# Patient Record
Sex: Male | Born: 1940 | Race: White | Hispanic: No | Marital: Married | Smoking: Never smoker
Health system: Southern US, Community
[De-identification: ages and names within clinical notes are randomized; demographics above are authoritative.]

## PROBLEM LIST (undated history)

## (undated) DIAGNOSIS — I6522 Occlusion and stenosis of left carotid artery: Secondary | ICD-10-CM

## (undated) DIAGNOSIS — C61 Malignant neoplasm of prostate: Secondary | ICD-10-CM

## (undated) DIAGNOSIS — I10 Essential (primary) hypertension: Secondary | ICD-10-CM

## (undated) DIAGNOSIS — I639 Cerebral infarction, unspecified: Secondary | ICD-10-CM

## (undated) DIAGNOSIS — I38 Endocarditis, valve unspecified: Secondary | ICD-10-CM

## (undated) DIAGNOSIS — H269 Unspecified cataract: Secondary | ICD-10-CM

## (undated) DIAGNOSIS — E119 Type 2 diabetes mellitus without complications: Secondary | ICD-10-CM

## (undated) DIAGNOSIS — G4730 Sleep apnea, unspecified: Secondary | ICD-10-CM

## (undated) DIAGNOSIS — E785 Hyperlipidemia, unspecified: Secondary | ICD-10-CM

## (undated) DIAGNOSIS — I2510 Atherosclerotic heart disease of native coronary artery without angina pectoris: Secondary | ICD-10-CM

## (undated) HISTORY — DX: Endocarditis, valve unspecified: I38

## (undated) HISTORY — PX: COLONOSCOPY: SHX174

## (undated) HISTORY — DX: Hyperlipidemia, unspecified: E78.5

## (undated) HISTORY — PX: CARDIAC CATHETERIZATION: SHX172

## (undated) HISTORY — PX: PROSTATECTOMY: SHX69

## (undated) HISTORY — DX: Essential (primary) hypertension: I10

## (undated) HISTORY — DX: Cerebral infarction, unspecified: I63.9

## (undated) HISTORY — DX: Type 2 diabetes mellitus without complications: E11.9

## (undated) HISTORY — PX: TONSILLECTOMY: SUR1361

## (undated) HISTORY — PX: CATARACT EXTRACTION, BILATERAL: SHX1313

## (undated) HISTORY — PX: INGUINAL HERNIA REPAIR: SUR1180

## (undated) HISTORY — PX: APPENDECTOMY: SHX54

## (undated) HISTORY — DX: Malignant neoplasm of prostate: C61

## (undated) HISTORY — DX: Sleep apnea, unspecified: G47.30

## (undated) HISTORY — DX: Unspecified cataract: H26.9

---

## 1999-09-29 ENCOUNTER — Ambulatory Visit (HOSPITAL_COMMUNITY): Admission: RE | Admit: 1999-09-29 | Discharge: 1999-09-29 | Payer: Self-pay | Admitting: Internal Medicine

## 1999-09-29 ENCOUNTER — Encounter: Payer: Self-pay | Admitting: Internal Medicine

## 1999-10-13 ENCOUNTER — Other Ambulatory Visit: Admission: RE | Admit: 1999-10-13 | Discharge: 1999-10-13 | Payer: Self-pay | Admitting: Urology

## 2000-01-01 ENCOUNTER — Encounter: Admission: RE | Admit: 2000-01-01 | Discharge: 2000-03-31 | Payer: Self-pay | Admitting: Radiation Oncology

## 2000-02-27 DIAGNOSIS — Z9079 Acquired absence of other genital organ(s): Secondary | ICD-10-CM | POA: Insufficient documentation

## 2002-06-24 DIAGNOSIS — E7800 Pure hypercholesterolemia, unspecified: Secondary | ICD-10-CM | POA: Insufficient documentation

## 2002-06-24 DIAGNOSIS — I10 Essential (primary) hypertension: Secondary | ICD-10-CM | POA: Insufficient documentation

## 2004-06-19 ENCOUNTER — Ambulatory Visit: Payer: Self-pay | Admitting: Cardiology

## 2005-06-06 ENCOUNTER — Ambulatory Visit: Payer: Self-pay | Admitting: Internal Medicine

## 2005-09-20 ENCOUNTER — Ambulatory Visit: Payer: Self-pay | Admitting: Internal Medicine

## 2005-12-12 ENCOUNTER — Ambulatory Visit: Payer: Self-pay | Admitting: Internal Medicine

## 2005-12-18 ENCOUNTER — Ambulatory Visit: Payer: Self-pay | Admitting: Internal Medicine

## 2006-02-27 ENCOUNTER — Ambulatory Visit: Payer: Self-pay | Admitting: Internal Medicine

## 2006-05-24 ENCOUNTER — Ambulatory Visit: Payer: Self-pay

## 2006-05-30 ENCOUNTER — Ambulatory Visit: Payer: Self-pay | Admitting: Internal Medicine

## 2006-08-26 ENCOUNTER — Ambulatory Visit: Payer: Self-pay | Admitting: Internal Medicine

## 2006-09-06 ENCOUNTER — Ambulatory Visit: Payer: Self-pay | Admitting: Internal Medicine

## 2006-09-06 LAB — CONVERTED CEMR LAB
BUN: 17 mg/dL (ref 6–23)
CO2: 30 meq/L (ref 19–32)
Calcium: 9.3 mg/dL (ref 8.4–10.5)
Chloride: 104 meq/L (ref 96–112)
Creatinine, Ser: 1.2 mg/dL (ref 0.4–1.5)
GFR calc Af Amer: 78 mL/min
GFR calc non Af Amer: 65 mL/min
Glucose, Bld: 118 mg/dL — ABNORMAL HIGH (ref 70–99)
Potassium: 4.1 meq/L (ref 3.5–5.1)
Sodium: 142 meq/L (ref 135–145)

## 2006-09-09 ENCOUNTER — Ambulatory Visit: Payer: Self-pay | Admitting: Internal Medicine

## 2007-01-06 ENCOUNTER — Ambulatory Visit: Payer: Self-pay | Admitting: Internal Medicine

## 2007-01-06 LAB — CONVERTED CEMR LAB
ALT: 24 units/L (ref 0–40)
AST: 22 units/L (ref 0–37)
Albumin: 3.9 g/dL (ref 3.5–5.2)
Alkaline Phosphatase: 68 units/L (ref 39–117)
BUN: 23 mg/dL (ref 6–23)
Bilirubin, Direct: 0.1 mg/dL (ref 0.0–0.3)
CO2: 32 meq/L (ref 19–32)
Calcium: 9.3 mg/dL (ref 8.4–10.5)
Chloride: 108 meq/L (ref 96–112)
Cholesterol: 162 mg/dL (ref 0–200)
Creatinine, Ser: 1.1 mg/dL (ref 0.4–1.5)
GFR calc Af Amer: 86 mL/min
GFR calc non Af Amer: 71 mL/min
Glucose, Bld: 117 mg/dL — ABNORMAL HIGH (ref 70–99)
HDL: 40.9 mg/dL (ref >39.0)
Hgb A1c MFr Bld: 6.6 % — ABNORMAL HIGH (ref 4.6–6.0)
LDL Cholesterol: 96 mg/dL (ref 0–99)
Potassium: 4.3 meq/L (ref 3.5–5.1)
Sodium: 144 meq/L (ref 135–145)
Total Bilirubin: 0.5 mg/dL (ref 0.3–1.2)
Total CHOL/HDL Ratio: 4
Total Protein: 6.8 g/dL (ref 6.0–8.3)
Triglycerides: 127 mg/dL (ref 0–149)
VLDL: 25 mg/dL (ref 0–40)
Vit D, 1,25-Dihydroxy: 26 (ref 20–57)

## 2007-04-15 ENCOUNTER — Ambulatory Visit: Payer: Self-pay | Admitting: Internal Medicine

## 2007-04-15 LAB — CONVERTED CEMR LAB
AST: 23 units/L (ref 0–37)
Albumin: 3.7 g/dL (ref 3.5–5.2)
CO2: 27 meq/L (ref 19–32)
Chloride: 105 meq/L (ref 96–112)
Cholesterol: 147 mg/dL (ref 0–200)
Creatinine, Ser: 1.1 mg/dL (ref 0.4–1.5)
Glucose, Bld: 114 mg/dL — ABNORMAL HIGH (ref 70–99)
HDL: 37.2 mg/dL — ABNORMAL LOW (ref >39.0)
Hgb A1c MFr Bld: 6.2 % — ABNORMAL HIGH (ref 4.6–6.0)
Sodium: 140 meq/L (ref 135–145)
Total Bilirubin: 0.6 mg/dL (ref 0.3–1.2)
VLDL: 33 mg/dL (ref 0–40)

## 2007-06-24 ENCOUNTER — Encounter: Payer: Self-pay | Admitting: Internal Medicine

## 2007-06-24 DIAGNOSIS — E785 Hyperlipidemia, unspecified: Secondary | ICD-10-CM

## 2007-06-24 DIAGNOSIS — I10 Essential (primary) hypertension: Secondary | ICD-10-CM | POA: Insufficient documentation

## 2007-06-24 DIAGNOSIS — E119 Type 2 diabetes mellitus without complications: Secondary | ICD-10-CM | POA: Insufficient documentation

## 2007-06-24 DIAGNOSIS — Z8546 Personal history of malignant neoplasm of prostate: Secondary | ICD-10-CM | POA: Insufficient documentation

## 2007-06-24 DIAGNOSIS — E1159 Type 2 diabetes mellitus with other circulatory complications: Secondary | ICD-10-CM | POA: Insufficient documentation

## 2007-11-26 ENCOUNTER — Encounter: Payer: Self-pay | Admitting: Internal Medicine

## 2008-05-24 ENCOUNTER — Encounter: Payer: Self-pay | Admitting: Internal Medicine

## 2008-05-27 ENCOUNTER — Ambulatory Visit: Payer: Self-pay | Admitting: Internal Medicine

## 2008-05-31 ENCOUNTER — Ambulatory Visit: Payer: Self-pay

## 2008-08-17 ENCOUNTER — Telehealth: Payer: Self-pay | Admitting: Internal Medicine

## 2008-10-29 ENCOUNTER — Ambulatory Visit: Payer: Self-pay | Admitting: Internal Medicine

## 2008-10-29 LAB — CONVERTED CEMR LAB
ALT: 42 units/L (ref 0–53)
AST: 32 units/L (ref 0–37)
Bilirubin, Direct: 0.1 mg/dL (ref 0.0–0.3)
CO2: 33 meq/L — ABNORMAL HIGH (ref 19–32)
Calcium: 9.4 mg/dL (ref 8.4–10.5)
Chloride: 102 meq/L (ref 96–112)
Glucose, Bld: 146 mg/dL — ABNORMAL HIGH (ref 70–99)
Sodium: 142 meq/L (ref 135–145)
Total Protein: 6.8 g/dL (ref 6.0–8.3)

## 2008-11-04 ENCOUNTER — Ambulatory Visit: Payer: Self-pay | Admitting: Internal Medicine

## 2009-03-10 ENCOUNTER — Ambulatory Visit: Payer: Self-pay | Admitting: Internal Medicine

## 2009-07-11 ENCOUNTER — Ambulatory Visit: Payer: Self-pay | Admitting: Internal Medicine

## 2009-07-11 LAB — CONVERTED CEMR LAB
BUN: 20 mg/dL
CO2: 30 meq/L
Calcium: 9.2 mg/dL
Chloride: 101 meq/L
Cholesterol: 140 mg/dL
Creatinine, Ser: 1.2 mg/dL
GFR calc non Af Amer: 64.06 mL/min
Glucose, Bld: 171 mg/dL — ABNORMAL HIGH
HDL: 41.6 mg/dL
Hgb A1c MFr Bld: 6.3 %
LDL Cholesterol: 74 mg/dL
Potassium: 4.1 meq/L
Sodium: 139 meq/L
Total CHOL/HDL Ratio: 3
Triglycerides: 123 mg/dL
VLDL: 24.6 mg/dL

## 2009-07-19 ENCOUNTER — Ambulatory Visit: Payer: Self-pay | Admitting: Internal Medicine

## 2009-07-19 DIAGNOSIS — M545 Low back pain, unspecified: Secondary | ICD-10-CM | POA: Insufficient documentation

## 2009-09-29 ENCOUNTER — Encounter: Payer: Self-pay | Admitting: Internal Medicine

## 2009-10-18 ENCOUNTER — Encounter: Payer: Self-pay | Admitting: Internal Medicine

## 2009-11-23 ENCOUNTER — Ambulatory Visit: Payer: Self-pay | Admitting: Internal Medicine

## 2009-11-23 LAB — CONVERTED CEMR LAB
CO2: 30 meq/L (ref 19–32)
Chloride: 102 meq/L (ref 96–112)
Hgb A1c MFr Bld: 6.5 % (ref 4.6–6.5)
Potassium: 3.8 meq/L (ref 3.5–5.1)
Sodium: 139 meq/L (ref 135–145)

## 2009-11-25 ENCOUNTER — Ambulatory Visit: Payer: Self-pay | Admitting: Internal Medicine

## 2010-03-30 ENCOUNTER — Ambulatory Visit: Payer: Self-pay | Admitting: Internal Medicine

## 2010-03-30 LAB — CONVERTED CEMR LAB: Blood Glucose, Fingerstick: 136

## 2010-03-31 LAB — CONVERTED CEMR LAB
CO2: 29 meq/L (ref 19–32)
Chloride: 103 meq/L (ref 96–112)
Creatinine, Ser: 1.1 mg/dL (ref 0.4–1.5)
Hgb A1c MFr Bld: 6.6 % — ABNORMAL HIGH (ref 4.6–6.5)
Sodium: 141 meq/L (ref 135–145)

## 2010-04-25 ENCOUNTER — Encounter: Payer: Self-pay | Admitting: Internal Medicine

## 2010-08-24 ENCOUNTER — Ambulatory Visit
Admission: RE | Admit: 2010-08-24 | Discharge: 2010-08-24 | Payer: Self-pay | Source: Home / Self Care | Attending: Internal Medicine | Admitting: Internal Medicine

## 2010-08-24 ENCOUNTER — Other Ambulatory Visit: Payer: Self-pay | Admitting: Internal Medicine

## 2010-08-24 DIAGNOSIS — R0989 Other specified symptoms and signs involving the circulatory and respiratory systems: Secondary | ICD-10-CM

## 2010-08-24 DIAGNOSIS — R0609 Other forms of dyspnea: Secondary | ICD-10-CM | POA: Insufficient documentation

## 2010-08-24 LAB — LIPID PANEL
Cholesterol: 157 mg/dL (ref 0–200)
HDL: 35.8 mg/dL — ABNORMAL LOW (ref >39.00)
LDL Cholesterol: 90 mg/dL (ref 0–99)
Total CHOL/HDL Ratio: 4
Triglycerides: 155 mg/dL — ABNORMAL HIGH (ref 0.0–149.0)
VLDL: 31 mg/dL (ref 0.0–40.0)

## 2010-08-24 LAB — CBC WITH DIFFERENTIAL/PLATELET
Basophils Absolute: 0.1 10*3/uL (ref 0.0–0.1)
Basophils Relative: 0.9 % (ref 0.0–3.0)
Eosinophils Absolute: 0.2 10*3/uL (ref 0.0–0.7)
Eosinophils Relative: 1.8 % (ref 0.0–5.0)
HCT: 42.9 % (ref 39.0–52.0)
Hemoglobin: 14.6 g/dL (ref 13.0–17.0)
Lymphocytes Relative: 27.5 % (ref 12.0–46.0)
Lymphs Abs: 2.9 10*3/uL (ref 0.7–4.0)
MCHC: 34 g/dL (ref 30.0–36.0)
MCV: 90 fl (ref 78.0–100.0)
Monocytes Absolute: 0.6 10*3/uL (ref 0.1–1.0)
Monocytes Relative: 6 % (ref 3.0–12.0)
Neutro Abs: 6.8 10*3/uL (ref 1.4–7.7)
Neutrophils Relative %: 63.8 % (ref 43.0–77.0)
Platelets: 306 10*3/uL (ref 150.0–400.0)
RBC: 4.76 Mil/uL (ref 4.22–5.81)
RDW: 12.9 % (ref 11.5–14.6)
WBC: 10.6 10*3/uL — ABNORMAL HIGH (ref 4.5–10.5)

## 2010-08-24 LAB — URINALYSIS
Bilirubin Urine: NEGATIVE
Hemoglobin, Urine: NEGATIVE
Leukocytes, UA: NEGATIVE
Nitrite: NEGATIVE
Specific Gravity, Urine: >=1.03 (ref 1.000–1.030)
Total Protein, Urine: NEGATIVE
Urine Glucose: NEGATIVE
Urobilinogen, UA: 0.2 (ref 0.0–1.0)
pH: 5.5 (ref 5.0–8.0)

## 2010-08-24 LAB — BASIC METABOLIC PANEL
BUN: 21 mg/dL (ref 6–23)
CO2: 31 mEq/L (ref 19–32)
Calcium: 9.4 mg/dL (ref 8.4–10.5)
Chloride: 99 mEq/L (ref 96–112)
Creatinine, Ser: 1.1 mg/dL (ref 0.4–1.5)
GFR: 67.74 mL/min (ref >60.00)
Glucose, Bld: 148 mg/dL — ABNORMAL HIGH (ref 70–99)
Potassium: 4.1 mEq/L (ref 3.5–5.1)
Sodium: 140 mEq/L (ref 135–145)

## 2010-08-24 LAB — HEPATIC FUNCTION PANEL
ALT: 37 U/L (ref 0–53)
AST: 34 U/L (ref 0–37)
Albumin: 3.8 g/dL (ref 3.5–5.2)
Alkaline Phosphatase: 81 U/L (ref 39–117)
Bilirubin, Direct: 0.1 mg/dL (ref 0.0–0.3)
Total Bilirubin: 0.9 mg/dL (ref 0.3–1.2)
Total Protein: 6.8 g/dL (ref 6.0–8.3)

## 2010-08-24 LAB — TSH: TSH: 1.62 u[IU]/mL (ref 0.35–5.50)

## 2010-08-24 LAB — PSA: PSA: 0 ng/mL — ABNORMAL LOW (ref 0.10–4.00)

## 2010-08-24 LAB — HEMOGLOBIN A1C: Hgb A1c MFr Bld: 6.8 % — ABNORMAL HIGH (ref 4.6–6.5)

## 2010-08-28 ENCOUNTER — Encounter: Payer: Self-pay | Admitting: Internal Medicine

## 2010-09-07 ENCOUNTER — Ambulatory Visit: Admit: 2010-09-07 | Payer: Self-pay | Admitting: Pulmonary Disease

## 2010-09-13 ENCOUNTER — Encounter: Payer: Self-pay | Admitting: Internal Medicine

## 2010-09-17 LAB — CONVERTED CEMR LAB
AST: 26 units/L (ref 0–37)
Albumin: 3.8 g/dL (ref 3.5–5.2)
Alkaline Phosphatase: 62 units/L (ref 39–117)
BUN: 16 mg/dL (ref 6–23)
Bacteria, UA: NEGATIVE
Basophils Relative: 0.7 % (ref 0.0–3.0)
Bilirubin, Direct: 0.1 mg/dL (ref 0.0–0.3)
Chloride: 102 meq/L (ref 96–112)
Eosinophils Relative: 2.2 % (ref 0.0–5.0)
GFR calc non Af Amer: 71 mL/min
Glucose, Bld: 129 mg/dL — ABNORMAL HIGH (ref 70–99)
LDL Cholesterol: 77 mg/dL (ref 0–99)
Lymphocytes Relative: 31.7 % (ref 12.0–46.0)
MCV: 90.7 fL (ref 78.0–100.0)
Neutrophils Relative %: 58.7 % (ref 43.0–77.0)
Potassium: 4 meq/L (ref 3.5–5.1)
RBC: 4.5 M/uL (ref 4.22–5.81)
Specific Gravity, Urine: >=1.03 (ref 1.000–1.03)
Total Protein, Urine: NEGATIVE mg/dL
Total Protein: 6.8 g/dL (ref 6.0–8.3)
Urine Glucose: NEGATIVE mg/dL
VLDL: 32 mg/dL (ref 0–40)
WBC: 10.5 10*3/uL (ref 4.5–10.5)
pH: 5 (ref 5.0–8.0)

## 2010-09-19 NOTE — Assessment & Plan Note (Signed)
Summary: 4 mos f/u / #/cd  RS'D PER PT/NWS   Vital Signs:  Patient profile:   70 year old male Height:      72 inches Weight:      260.25 pounds BMI:     35.42 O2 Sat:      97 % on Room air Temp:     97.5 degrees F oral Pulse rate:   83 / minute BP sitting:   140 / 62  (left arm) Cuff size:   large  Vitals Entered By: Lucious Groves (November 25, 2009 10:23 AM)  O2 Flow:  Room air CC: 4 mo f/u--No symptoms or complaints per pt./kb Is Patient Diabetic? Yes Did you bring your meter with you today? No Pain Assessment Patient in pain? no        CC:  4 mo f/u--No symptoms or complaints per pt./kb.  History of Present Illness: The patient presents for a follow up of hypertension, diabetes, hyperlipidemia BP OK at home  Current Medications (verified): 1)  Zocor 40 Mg  Tabs (Simvastatin) .... Once Daily 2)  Adult Aspirin Low Strength 81 Mg  Tbdp (Aspirin) .... Once Daily 3)  Lotensin Hct 20-25 Mg  Tabs (Benazepril-Hydrochlorothiazide) .... Once Daily 4)  Glucophage 1000 Mg  Tabs (Metformin Hcl) .... Two Times A Day 5)  Verapamil Hcl Cr 240 Mg  Cp24 (Verapamil Hcl) .... Once Daily 6)  Amaryl 1 Mg  Tabs (Glimepiride) .... Two Times A Day 7)  Vitamin D3 1000 Unit  Tabs (Cholecalciferol) .[name].. 1 By Mouth Daily 8)  Accu-Chek Aviva  Strp (Glucose Blood) .... Test Blood Glucose Twice A Day For Diabetes Management  Allergies (verified): 1)  ! Clindamycin Hcl (Clindamycin Hcl) 2)  ! Prednisone (Prednisone)  Past History:  Social History: Last updated: 03/10/2009 Married Never Smoked Alcohol use-no Drug use-no Regular exercise-no, golf  Past Medical History: Reviewed history from 06/24/2007 and no changes required. Prostate cancer, hx of Diabetes mellitus, type II Hyperlipidemia Hypertension migraine weight gain  Review of Systems  The patient denies chest pain, syncope, abdominal pain, and severe indigestion/heartburn.    Physical Exam  General:  overweight-appearing.    Nose:  External nasal examination shows no deformity or inflammation. Nasal mucosa are pink and moist without lesions or exudates. Mouth:  Oral mucosa and oropharynx without lesions or exudates.  Teeth in good repair. Lungs:  Normal respiratory effort, chest expands symmetrically. Lungs are clear to auscultation, no crackles or wheezes. Heart:  Normal rate and regular rhythm. S1 and S2 normal without gallop, murmur, click, rub or other extra sounds. Abdomen:  Bowel sounds positive,abdomen soft and non-tender without masses, organomegaly or hernias noted. Msk:  No deformity or scoliosis noted of thoracic or lumbar spine.   Extremities:  No clubbing, cyanosis, edema, or deformity noted with normal full range of motion of all joints.   Neurologic:  No cranial nerve deficits noted. Station and gait are normal. Plantar reflexes are down-going bilaterally. DTRs are symmetrical throughout. Sensory, motor and coordinative functions appear intact. Skin:  Intact without suspicious lesions or rashes Psych:  Cognition and judgment appear intact. Alert and cooperative with normal attention span and concentration. No apparent delusions, illusions, hallucinations   Impression & Recommendations:  Problem # 1:  HYPERTENSION (ICD-401.9) Assessment Improved  His updated medication list for this problem includes:    Lotensin Hct 20-25 Mg Tabs (Benazepril-hydrochlorothiazide) ..... Once daily    Verapamil Hcl Cr 240 Mg Cp24 (Verapamil hcl) ..... Once daily  BP  today: 140/62 Prior BP: 154/72 (07/19/2009)  Labs Reviewed: K+: 3.8 (11/23/2009) Creat: : 1.1 (11/23/2009)   Chol: 140 (07/11/2009)   HDL: 41.60 (07/11/2009)   LDL: 74 (07/11/2009)   TG: 123.0 (07/11/2009)  Problem # 2:  DIABETES MELLITUS, TYPE II (ICD-250.00) Assessment: Improved  His updated medication list for this problem includes:    Adult Aspirin Low Strength 81 Mg Tbdp (Aspirin) ..... Once daily    Lotensin Hct 20-25 Mg Tabs  (Benazepril-hydrochlorothiazide) ..... Once daily    Glucophage 1000 Mg Tabs (Metformin hcl) .[name]..[name] Two times a day    Amaryl 1 Mg Tabs (Glimepiride) .[name]..[name] Two times a day  Labs Reviewed: Creat: 1.1 (11/23/2009)    Reviewed HgBA1c results: 6.5 (11/23/2009)  6.3 (07/11/2009)  Problem # 3:  LOW BACK PAIN, ACUTE (ICD-724.2) Assessment: Improved  His updated medication list for this problem includes:    Adult Aspirin Low Strength 81 Mg Tbdp (Aspirin) ..... Once daily  Problem # 4:  HYPERTENSION (ICD-401.9)  His updated medication list for this problem includes:    Lotensin Hct 20-25 Mg Tabs (Benazepril-hydrochlorothiazide) ..... Once daily    Verapamil Hcl Cr 240 Mg Cp24 (Verapamil hcl) ..... Once daily  Problem # 5:  DIABETES MELLITUS, TYPE II (ICD-250.00)  His updated medication list for this problem includes:    Adult Aspirin Low Strength 81 Mg Tbdp (Aspirin) ..... Once daily    Lotensin Hct 20-25 Mg Tabs (Benazepril-hydrochlorothiazide) ..... Once daily    Glucophage 1000 Mg Tabs (Metformin hcl) .[name]..[name] Two times a day    Amaryl 1 Mg Tabs (Glimepiride) .[name]..[name] Two times a day  Complete Medication List: 1)  Zocor 40 Mg Tabs (Simvastatin) .... Once daily 2)  Adult Aspirin Low Strength 81 Mg Tbdp (Aspirin) .... Once daily 3)  Lotensin Hct 20-25 Mg Tabs (Benazepril-hydrochlorothiazide) .... Once daily 4)  Glucophage 1000 Mg Tabs (Metformin hcl) .... Two times a day 5)  Verapamil Hcl Cr 240 Mg Cp24 (Verapamil hcl) .... Once daily 6)  Amaryl 1 Mg Tabs (Glimepiride) .... Two times a day 7)  Vitamin D3 1000 Unit Tabs (Cholecalciferol) .[name].. 1 by mouth daily 8)  Accu-chek Aviva Strp (Glucose blood) .... Test blood glucose twice a day for diabetes management  Patient Instructions: 1)  Please schedule a follow-up appointment in 4 months. 2)  BMP prior to visit, ICD-9: 3)  HbgA1C prior to visit, ICD-9: 250.00

## 2010-09-19 NOTE — Letter (Signed)
Summary: [facility]  [facility]   Imported By: Lennie Odor 05/05/2010 12:16:27  _____________________________________________________________________  External Attachment:    Type:   Image     Comment:   External Document

## 2010-09-19 NOTE — Assessment & Plan Note (Signed)
Summary: 4 MO ROV /NWS  #   Vital Signs:  Patient profile:   70 year old male Height:      72 inches Weight:      255 pounds BMI:     34.71 O2 Sat:      96 % on Room air Temp:     97.7 degrees F oral Pulse rate:   82 / minute Pulse rhythm:   regular Resp:     16 per minute BP sitting:   130 / 80  (left arm) Cuff size:   large  Vitals Entered By: Lanier Prude, CMA(AAMA) (March 30, 2010 7:49 AM)  O2 Flow:  Room air CC: 4 mo f/u Is Patient Diabetic? Yes CBG Result 136   CC:  4 mo f/u.  History of Present Illness: The patient presents for a follow up of hypertension, diabetes, hyperlipidemia Lost 5 lbs  Current Medications (verified): 1)  Zocor 40 Mg  Tabs (Simvastatin) .... Once Daily 2)  Adult Aspirin Low Strength 81 Mg  Tbdp (Aspirin) .... Once Daily 3)  Lotensin Hct 20-25 Mg  Tabs (Benazepril-Hydrochlorothiazide) .... Once Daily 4)  Glucophage 1000 Mg  Tabs (Metformin Hcl) .... Two Times A Day 5)  Verapamil Hcl Cr 240 Mg  Cp24 (Verapamil Hcl) .... Once Daily 6)  Amaryl 1 Mg  Tabs (Glimepiride) .... Two Times A Day 7)  Vitamin D3 1000 Unit  Tabs (Cholecalciferol) .[name].. 1 By Mouth Daily 8)  Accu-Chek Aviva  Strp (Glucose Blood) .... Test Blood Glucose Twice A Day For Diabetes Management 9)  Icaps  Caps (Multiple Vitamins-Minerals) .[name].. 1 Two Times A Day 10)  Multivitamins  Tabs (Multiple Vitamin) .[name].. 1 Once Daily  Allergies (verified): 1)  ! Clindamycin Hcl (Clindamycin Hcl) 2)  ! Prednisone (Prednisone)  Past History:  Past Medical History: Last updated: 06/24/2007 Prostate cancer, hx of Diabetes mellitus, type II Hyperlipidemia Hypertension migraine weight gain  Past Surgical History: Prostatectomy  Review of Systems  The patient denies fever, weight loss, weight gain, prolonged cough, abdominal pain, and hematochezia.    Physical Exam  General:  overweight-appearing.   Ears:  External ear exam shows no significant lesions or deformities.   Otoscopic examination reveals clear canals, tympanic membranes are intact bilaterally without bulging, retraction, inflammation or discharge. Hearing is grossly normal bilaterally. Nose:  External nasal examination shows no deformity or inflammation. Nasal mucosa are pink and moist without lesions or exudates. Mouth:  Oral mucosa and oropharynx without lesions or exudates.  Teeth in good repair. Lungs:  Normal respiratory effort, chest expands symmetrically. Lungs are clear to auscultation, no crackles or wheezes. Heart:  Normal rate and regular rhythm. S1 and S2 normal without gallop, murmur, click, rub or other extra sounds. Abdomen:  Bowel sounds positive,abdomen soft and non-tender without masses, organomegaly or hernias noted. Msk:  No deformity or scoliosis noted of thoracic or lumbar spine.   Neurologic:  No cranial nerve deficits noted. Station and gait are normal. Plantar reflexes are down-going bilaterally. DTRs are symmetrical throughout. Sensory, motor and coordinative functions appear intact. Skin:  Intact without suspicious lesions or rashes Psych:  Cognition and judgment appear intact. Alert and cooperative with normal attention span and concentration. No apparent delusions, illusions, hallucinations   Impression & Recommendations:  Problem # 1:  HYPERTENSION (ICD-401.9) Assessment Improved  His updated medication list for this problem includes:    Lotensin Hct 20-25 Mg Tabs (Benazepril-hydrochlorothiazide) ..... Once daily    Verapamil Hcl Cr 240 Mg Cp24 (Verapamil  hcl) ..... Once daily The labs were reviewed with the patient.  BP today: 130/80 Prior BP: 140/62 (11/25/2009)  Labs Reviewed: K+: 3.8 (11/23/2009) Creat: : 1.1 (11/23/2009)   Chol: 140 (07/11/2009)   HDL: 41.60 (07/11/2009)   LDL: 74 (07/11/2009)   TG: 123.0 (07/11/2009)  Problem # 2:  DIABETES MELLITUS, TYPE II (ICD-250.00) Assessment: Comment Only  His updated medication list for this problem includes:     Adult Aspirin Low Strength 81 Mg Tbdp (Aspirin) ..... Once daily    Lotensin Hct 20-25 Mg Tabs (Benazepril-hydrochlorothiazide) ..... Once daily    Glucophage 1000 Mg Tabs (Metformin hcl) .[name]..[name] Two times a day    Amaryl 1 Mg Tabs (Glimepiride) .[name]..[name] Two times a day  Orders: Capillary Blood Glucose/CBG (32440)  Problem # 3:  HYPERLIPIDEMIA (ICD-272.4) Assessment: Comment Only  His updated medication list for this problem includes:    Zocor 40 Mg Tabs (Simvastatin) ..... Once daily  Problem # 4:  PROSTATE CANCER, HX OF (ICD-V10.46) Assessment: Unchanged  Complete Medication List: 1)  Zocor 40 Mg Tabs (Simvastatin) .... Once daily 2)  Adult Aspirin Low Strength 81 Mg Tbdp (Aspirin) .... Once daily 3)  Lotensin Hct 20-25 Mg Tabs (Benazepril-hydrochlorothiazide) .... Once daily 4)  Glucophage 1000 Mg Tabs (Metformin hcl) .... Two times a day 5)  Verapamil Hcl Cr 240 Mg Cp24 (Verapamil hcl) .... Once daily 6)  Amaryl 1 Mg Tabs (Glimepiride) .... Two times a day 7)  Vitamin D3 1000 Unit Tabs (Cholecalciferol) .[name].. 1 by mouth daily 8)  Accu-chek Aviva Strp (Glucose blood) .... Test blood glucose twice a day for diabetes management 9)  Icaps Caps (Multiple vitamins-minerals) .[name].. 1 two times a day 10)  Multivitamins Tabs (Multiple vitamin) .[name].. 1 once daily  Patient Instructions: 1)  Please schedule a follow-up appointment in 4 months well w/labs v70.0 and A1c. Prescriptions: ACCU-CHEK AVIVA  STRP (GLUCOSE BLOOD) test blood glucose twice a day for diabetes management  #180 x 0   Entered and Authorized by:   Tresa Garter MD   Signed by:   Tresa Garter MD on 03/30/2010   Method used:   Print then Give to Patient   RxID:   [id number] AMARYL 1 MG  TABS (GLIMEPIRIDE) two times a day  #180 Each x 3   Entered and Authorized by:   Tresa Garter MD   Signed by:   Tresa Garter MD on 03/30/2010   Method used:   Print then Give to Patient   RxID:    [id number] VERAPAMIL HCL CR 240 MG  CP24 (VERAPAMIL HCL) once daily  #90 Each x 3   Entered and Authorized by:   Tresa Garter MD   Signed by:   Tresa Garter MD on 03/30/2010   Method used:   Print then Give to Patient   RxID:   [id number] GLUCOPHAGE 1000 MG  TABS (METFORMIN HCL) two times a day  #180 Each x 3   Entered and Authorized by:   Tresa Garter MD   Signed by:   Tresa Garter MD on 03/30/2010   Method used:   Print then Give to Patient   RxID:   [id number] LOTENSIN HCT 20-25 MG  TABS (BENAZEPRIL-HYDROCHLOROTHIAZIDE) once daily  #90 Each x 3   Entered and Authorized by:   Tresa Garter MD   Signed by:   Tresa Garter MD on 03/30/2010   Method used:   Print then Give to Patient  RxID:   [id number] ZOCOR 40 MG  TABS (SIMVASTATIN) once daily  #90 Each x 3   Entered and Authorized by:   Tresa Garter MD   Signed by:   Tresa Garter MD on 03/30/2010   Method used:   Print then Give to Patient   RxID:   [id number]

## 2010-09-19 NOTE — Letter (Signed)
Summary: [facility]  [facility]   Imported By: Sherian Rein 10/18/2009 10:28:35  _____________________________________________________________________  External Attachment:    Type:   Image     Comment:   External Document

## 2010-09-19 NOTE — Letter (Signed)
Summary: [facility]   Imported By: Sherian Rein 11/09/2009 07:37:49  _____________________________________________________________________  External Attachment:    Type:   Image     Comment:   External Document

## 2010-09-21 NOTE — Letter (Signed)
Summary: [name]  [facility]   Imported By: Sherian Rein 09/06/2010 14:31:39  _____________________________________________________________________  External Attachment:    Type:   Image     Comment:   External Document

## 2010-09-21 NOTE — Assessment & Plan Note (Signed)
Summary: 4 mos well / # / cd   Vital Signs:  Patient profile:   70 year old male Height:      72 inches Weight:      256 pounds BMI:     34.85 Temp:     97.9 degrees F oral Pulse rate:   76 / minute Pulse rhythm:   regular Resp:     16 per minute BP sitting:   140 / 70  (left arm) Cuff size:   large  Vitals Entered By: Lanier Prude, CMA(AAMA) (August 24, 2010 8:37 AM) CC: MWV Is Patient Diabetic? Yes   CC:  MWV.  History of Present Illness: The patient presents for a follow up of hypertension, diabetes, hyperlipidemia C/o URI. C/o snoring The patient presents for a preventive health examination  Patient past medical history, social history, and family history reviewed in detail no significant changes.  Patient is physically active. Depression is negative and mood is good. Hearing is normal, and able to perform activities of daily living. Risk of falling is negligible and home safety has been reviewed and is appropriate. Patient has normal height, he is overweight, and visual acuity is nl w/glasses. Patient has been counseled on age-appropriate routine health concerns for screening and prevention. Education, counseling done. Cognition is nl.  Preventive Screening-Counseling & Management  Alcohol-Tobacco     Alcohol drinks/day: 0     Smoking Status: never  Caffeine-Diet-Exercise     Caffeine use/day: 0     Does Patient Exercise: yes     Type of exercise: walking     Times/week: <3  Hep-HIV-STD-Contraception     Dental Visit-last 6 months yes     TSE monthly: no     Sun Exposure-Excessive: no  Safety-Violence-Falls     Seat Belt Use: yes     Helmet Use: n/a     Firearms in the Home: no firearms in the home     Smoke Detectors: yes     Violence in the Home: no risk noted     Sexual Abuse: no     Fall Risk:  no  Current Medications (verified): 1)  Zocor 40 Mg  Tabs (Simvastatin) .... Once Daily 2)  Adult Aspirin Low Strength 81 Mg  Tbdp (Aspirin) .... Once  Daily 3)  Lotensin Hct 20-25 Mg  Tabs (Benazepril-Hydrochlorothiazide) .... Once Daily 4)  Glucophage 1000 Mg  Tabs (Metformin Hcl) .... Two Times A Day 5)  Verapamil Hcl Cr 240 Mg  Cp24 (Verapamil Hcl) .... Once Daily 6)  Amaryl 1 Mg  Tabs (Glimepiride) .... Two Times A Day 7)  Vitamin D3 1000 Unit  Tabs (Cholecalciferol) .[name].. 1 By Mouth Daily 8)  Accu-Chek Aviva  Strp (Glucose Blood) .... Test Blood Glucose Twice A Day For Diabetes Management 9)  Icaps  Caps (Multiple Vitamins-Minerals) .[name].. 1 Two Times A Day 10)  Multivitamins  Tabs (Multiple Vitamin) .[name].. 1 Once Daily  Allergies (verified): 1)  ! Clindamycin Hcl (Clindamycin Hcl) 2)  ! Prednisone (Prednisone)  Past History:  Past Medical History: Last updated: 06/24/2007 Prostate cancer, hx of Diabetes mellitus, type II Hyperlipidemia Hypertension migraine weight gain  Past Surgical History: Last updated: 03/30/2010 Prostatectomy  Family History: Last updated: 03/10/2009 DM HTN  Social History: Last updated: 03/10/2009 Married Never Smoked Alcohol use-no Drug use-no Regular exercise-no, golf  Social History: Caffeine use/day:  0 Does Patient Exercise:  yes Dental Care w/in 6 mos.:  yes Sun Exposure-Excessive:  no Seat Belt Use:  yes Fall Risk:   no  Review of Systems       The patient complains of weight gain.  The patient denies anorexia, fever, weight loss, vision loss, decreased hearing, hoarseness, chest pain, syncope, dyspnea on exertion, peripheral edema, prolonged cough, headaches, hemoptysis, abdominal pain, melena, hematochezia, severe indigestion/heartburn, hematuria, incontinence, genital sores, muscle weakness, suspicious skin lesions, transient blindness, difficulty walking, depression, unusual weight change, abnormal bleeding, enlarged lymph nodes, angioedema, and testicular masses.    Physical Exam  General:  overweight-appearing.   Head:  Normocephalic and atraumatic without obvious  abnormalities. No apparent alopecia or balding. Eyes:  No corneal or conjunctival inflammation noted. EOMI. Perrla.  Ears:  External ear exam shows no significant lesions or deformities.  Otoscopic examination reveals clear canals, tympanic membranes are intact bilaterally without bulging, retraction, inflammation or discharge. Hearing is grossly normal bilaterally. Nose:  External nasal examination shows no deformity or inflammation. Nasal mucosa are pink and moist without lesions or exudates. Mouth:  Oral mucosa and oropharynx without lesions or exudates.  Teeth in good repair. Lungs:  Normal respiratory effort, chest expands symmetrically. Lungs are clear to auscultation, no crackles or wheezes. Heart:  Normal rate and regular rhythm. S1 and S2 normal without gallop, murmur, click, rub or other extra sounds. Abdomen:  Bowel sounds positive,abdomen soft and non-tender without masses, organomegaly or hernias noted. Rectal:  per urol Msk:  No deformity or scoliosis noted of thoracic or lumbar spine.   Extremities:  No clubbing, cyanosis, edema, or deformity noted with normal full range of motion of all joints.   Neurologic:  No cranial nerve deficits noted. Station and gait are normal. Plantar reflexes are down-going bilaterally. DTRs are symmetrical throughout. Sensory, motor and coordinative functions appear intact. Skin:  Intact without suspicious lesions or rashes Cervical Nodes:  No lymphadenopathy noted Psych:  Cognition and judgment appear intact. Alert and cooperative with normal attention span and concentration. No apparent delusions, illusions, hallucinations   Impression & Recommendations:  Problem # 1:  WELL ADULT EXAM (ICD-V70.0) Assessment New Overall doing well, age appropriate education and counseling updated and referral for appropriate preventive services done unless declined, immunizations up to date or declined, diet counseling done if overweight, urged to quit smoking if  smokes, most recent labs reviewed and current ordered if appropriate, ecg reviewed or declined (interpretation per ECG scanned in the EMR if done); information regarding Medicare Preventation requirements given if appropriate.  He had a stress test and a colon 2 years ago I have personally reviewed the Medicare Annual Wellness questionnaire and have noted 1.   The patient's medical and social history 2.   Their use of alcohol, tobacco or illicit drugs 3.   Their current medications and supplements 4.   The patient's functional ability including ADL's, fall risks, home safety risks and hearing or visual             impairment. 5.   Diet and physical activities 6.   Evidence for depression or mood disorders  The patients weight, height, BMI and visual acuity have been recorded in the chart I have made referrals, counseling and provided education to the patient based review of the above and I have provided the pt with a written personalized care plan for preventive services.    Orders: TLB-BMP (Basic Metabolic Panel-BMET) (80048-METABOL) TLB-CBC Platelet - w/Differential (85025-CBCD) TLB-Hepatic/Liver Function Pnl (80076-HEPATIC) TLB-Lipid Panel (80061-LIPID) TLB-TSH (Thyroid Stimulating Hormone) (84443-TSH) TLB-Udip ONLY (81003-UDIP) TLB-PSA (Prostate Specific Antigen) (84153-PSA) Medicare -1st Annual Wellness  Visit [phone])  Problem # 2:  HYPERTENSION (ICD-401.9) Assessment: Unchanged  His updated medication list for this problem includes:    Lotensin Hct 20-25 Mg Tabs (Benazepril-hydrochlorothiazide) ..... Once daily    Verapamil Hcl Cr 240 Mg Cp24 (Verapamil hcl) ..... Once daily  BP today: 140/70 Prior BP: 130/80 (03/30/2010)  Labs Reviewed: K+: 4.2 (03/30/2010) Creat: : 1.1 (03/30/2010)   Chol: 140 (07/11/2009)   HDL: 41.60 (07/11/2009)   LDL: 74 (07/11/2009)   TG: 123.0 (07/11/2009)  Problem # 3:  HYPERLIPIDEMIA (ICD-272.4) Assessment: Unchanged  His updated medication  list for this problem includes:    Zocor 40 Mg Tabs (Simvastatin) ..... Once daily  Labs Reviewed: SGOT: 32 (10/29/2008)   SGPT: 42 (10/29/2008)   HDL:41.60 (07/11/2009), 40.6 (05/27/2008)  LDL:74 (07/11/2009), 77 (05/27/2008)  Chol:140 (07/11/2009), 150 (05/27/2008)  Trig:123.0 (07/11/2009), 162 (05/27/2008)  Problem # 4:  PROSTATE CANCER, HX OF (ICD-V10.46) Assessment: Unchanged F/u w/Urol  Problem # 5:  DIABETES MELLITUS, TYPE II (ICD-250.00) Assessment: Unchanged  His updated medication list for this problem includes:    Adult Aspirin Low Strength 81 Mg Tbdp (Aspirin) ..... Once daily    Lotensin Hct 20-25 Mg Tabs (Benazepril-hydrochlorothiazide) ..... Once daily    Glucophage 1000 Mg Tabs (Metformin hcl) .[name]..[name] Two times a day    Amaryl 1 Mg Tabs (Glimepiride) .[name]..[name] Two times a day  Orders: TLB-A1C / Hgb A1C (Glycohemoglobin) (83036-A1C)  Labs Reviewed: Creat: 1.1 (03/30/2010)    Reviewed HgBA1c results: 6.6 (03/30/2010)  6.5 (11/23/2009)  Problem # 6:  SNORING (ICD-786.09) Assessment: Deteriorated  His updated medication list for this problem includes:    Lotensin Hct 20-25 Mg Tabs (Benazepril-hydrochlorothiazide) ..... Once daily  Orders: Sleep Study (Sleep Study)  Complete Medication List: 1)  Zocor 40 Mg Tabs (Simvastatin) .... Once daily 2)  Adult Aspirin Low Strength 81 Mg Tbdp (Aspirin) .... Once daily 3)  Lotensin Hct 20-25 Mg Tabs (Benazepril-hydrochlorothiazide) .... Once daily 4)  Glucophage 1000 Mg Tabs (Metformin hcl) .... Two times a day 5)  Verapamil Hcl Cr 240 Mg Cp24 (Verapamil hcl) .... Once daily 6)  Amaryl 1 Mg Tabs (Glimepiride) .... Two times a day 7)  Vitamin D3 1000 Unit Tabs (Cholecalciferol) .[name].. 1 by mouth daily 8)  Accu-chek Aviva Strp (Glucose blood) .... Test blood glucose twice a day for diabetes management 9)  Icaps Caps (Multiple vitamins-minerals) .[name].. 1 two times a day 10)  Multivitamins Tabs (Multiple vitamin) .[name].. 1 once  daily 11)  Altarussin Dm 100-10 Mg/59ml Syrp (Dextromethorphan-guaifenesin) .... Use 5-10 cc qid prn  Other Orders: Flu Vaccine 59yrs + MEDICARE PATIENTS (U0454) Administration Flu vaccine - MCR (U9811)  Patient Instructions: 1)  Please schedule a follow-up appointment in 4 months. 2)  BMP prior to visit, ICD-9:250.00 3)  HbgA1C prior to visit, ICD-9: Prescriptions: ALTARUSSIN DM 100-10 MG/5ML SYRP (DEXTROMETHORPHAN-GUAIFENESIN) use 5-10 cc qid prn  #480 ml x 1   Entered and Authorized by:   Tresa Garter MD   Signed by:   Tresa Garter MD on 08/24/2010   Method used:   Print then Give to Patient   RxID:   [id number]    Orders Added: 1)  Sleep Study [Sleep Study] 2)  Flu Vaccine 25yrs + MEDICARE PATIENTS [Q2039] 3)  Administration Flu vaccine - MCR [G0008] 4)  TLB-BMP (Basic Metabolic Panel-BMET) [80048-METABOL] 5)  TLB-CBC Platelet - w/Differential [85025-CBCD] 6)  TLB-Hepatic/Liver Function Pnl [80076-HEPATIC] 7)  TLB-Lipid Panel [80061-LIPID] 8)  TLB-TSH (Thyroid Stimulating Hormone) [84443-TSH] 9)  TLB-Udip ONLY [81003-UDIP] 10)  TLB-A1C / Hgb A1C (Glycohemoglobin) [83036-A1C] 11)  TLB-PSA (Prostate Specific Antigen) [[address]-UEA] 12)  Medicare -1st Annual Wellness Visit [G0438] 13)  Est. Patient Level IV [54098]   Immunizations Administered:  Influenza Vaccine # 1:    Vaccine Type: Fluvax MCR    Site: right deltoid    Mfr: Sanofi Pasteur    Dose: 0.5 ml    Route: IM    Given by: Lanier Prude, CMA(AAMA)    Exp. Date: 02/17/2011    Lot #: [id number]    VIS given: 03/14/10 version given August 24, 2010.   Immunizations Administered:  Influenza Vaccine # 1:    Vaccine Type: Fluvax MCR    Site: right deltoid    Mfr: Sanofi Pasteur    Dose: 0.5 ml    Route: IM    Given by: Lanier Prude, CMA(AAMA)    Exp. Date: 02/17/2011    Lot #: [id number]    VIS given: 03/14/10 version given August 24, 2010.

## 2010-09-21 NOTE — Letter (Signed)
Summary: Primary Care Appointment Letter  [facility]  [address] [place], [place] [address]   Phone: [phone]  Fax: [phone]    09/13/2010 MRN: [id number]  [facility] [address] [place]  [address]  Dear Mr. [facility],   Your Primary Care Physician Tresa Garter MD has indicated that:    _______it is time to schedule an appointment.    _______you missed your appointment on______ and need to call and          reschedule.    _______you need to have lab work done.    _______you need to schedule an appointment discuss lab or test results.    ___X____you need to call to reschedule your appointment that is                       scheduled on Dec 28, 2010 with Dr. Posey Rea for a follow-up.  Please call the office.     Please call our office as soon as possible. Our phone number is [phone]. Please press option 1. Our office is open 8a-12noon and 1p-5p, Monday through Friday.     Thank you,    [facility] Sched[facility]

## 2010-09-27 ENCOUNTER — Encounter: Payer: Self-pay | Admitting: Pulmonary Disease

## 2010-09-27 ENCOUNTER — Institutional Professional Consult (permissible substitution) (INDEPENDENT_AMBULATORY_CARE_PROVIDER_SITE_OTHER): Payer: Medicare Other | Admitting: Pulmonary Disease

## 2010-09-27 ENCOUNTER — Institutional Professional Consult (permissible substitution): Payer: Self-pay | Admitting: Pulmonary Disease

## 2010-09-27 DIAGNOSIS — R0989 Other specified symptoms and signs involving the circulatory and respiratory systems: Secondary | ICD-10-CM

## 2010-10-05 NOTE — Assessment & Plan Note (Signed)
Summary: consult for possible osa   Copy to:  [facility] Primary Provider/Referring Provider:  Tresa Garter MD  CC:  Sleep Consult.  History of Present Illness: the pt is a 70y/o male who I have been asked to see for possible osa.  He has been noted to have loud snoring and an abnormal breathing pattern during sleep, but the pt denies any choking arousals.  He goes to bed 10-11pm, and arises at 6-7am to start his day.  He feels that he is adequately rested when he arises.  He denies sleepiness or alertness issues during the day with periods of inactivity, yet states he often will take a short catnap at his desk in the afternoons.  He denies any issues with sleepiness while watching tv or reading in the evenings, and has no sleepiness issues with driving.  His weight is neutral over the last 2 years, and his epworth score today is 8.    Current Medications (verified): 1)  Zocor 40 Mg  Tabs (Simvastatin) .... Once Daily 2)  Adult Aspirin Low Strength 81 Mg  Tbdp (Aspirin) .... Once Daily 3)  Lotensin Hct 20-25 Mg  Tabs (Benazepril-Hydrochlorothiazide) .... Once Daily 4)  Glucophage 1000 Mg  Tabs (Metformin Hcl) .... Two Times A Day 5)  Verapamil Hcl Cr 240 Mg  Cp24 (Verapamil Hcl) .... Once Daily 6)  Amaryl 1 Mg  Tabs (Glimepiride) .... Two Times A Day 7)  Vitamin D3 1000 Unit  Tabs (Cholecalciferol) .[name].. 1 By Mouth Daily 8)  Accu-Chek Aviva  Strp (Glucose Blood) .... Test Blood Glucose Twice A Day For Diabetes Management 9)  Icaps  Caps (Multiple Vitamins-Minerals) .[name].. 1 Two Times A Day 10)  Multivitamins  Tabs (Multiple Vitamin) .[name].. 1 Once Daily  Allergies (verified): 1)  ! Clindamycin Hcl (Clindamycin Hcl) 2)  ! Prednisone (Prednisone)  Past History:  Past Medical History: Reviewed history from 06/24/2007 and no changes required. Prostate cancer, hx of Diabetes mellitus, type II Hyperlipidemia Hypertension migraine weight gain  Past Surgical  History: Prostatectomy appendectomy  Family History: Reviewed history from 03/10/2009 and no changes required. DM HTN heart disease: mother, father   Social History: Reviewed history from 03/10/2009 and no changes required. Married and lives with wife, Jennette Kettle.  Never SmokedHas children. Works in Airline pilot.  Alcohol use-no Drug use-no Regular exercise-no, golf  Review of Systems  The patient denies shortness of breath with activity, shortness of breath at rest, productive cough, non-productive cough, coughing up blood, chest pain, irregular heartbeats, acid heartburn, indigestion, loss of appetite, weight change, abdominal pain, difficulty swallowing, sore throat, tooth/dental problems, headaches, nasal congestion/difficulty breathing through nose, sneezing, itching, ear ache, anxiety, depression, hand/feet swelling, joint stiffness or pain, rash, change in color of mucus, and fever.    Vital Signs:  Patient profile:   70 year old male Height:      72 inches Weight:      261 pounds BMI:     35.53 O2 Sat:      97 % on Room air Temp:     97.6 degrees F oral Pulse rate:   89 / minute BP sitting:   160 / 82  (left arm) Cuff size:   large  Vitals Entered By: Arman Filter LPN (September 27, 2010 9:10 AM)  O2 Flow:  Room air CC: Sleep Consult Comments Medications reviewed with patient Arman Filter LPN  September 27, 2010 9:14 AM    Physical Exam  General:  ow male in nad  Eyes:  PERRLA and EOMI.   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 Mouth:  moderate elongation of soft palate and uvula Neck:  no jvd, tmg, LN Lungs:  clear to auscultation no wheezing or rhonchi Heart:  ?irreg, cvr. suspect frequent ectopics? Abdomen:  soft and nontender, bs+ Extremities:  mild edema, no cyanosis  pulses intact distally Neurologic:  alert, not sleepy, moves all 4   Impression & Recommendations:  Problem # 1:  SNORING  (ICD-786.09) the pt has loud snoring, but has also been noted to have an abnormal breathing pattern during sleep.  Despite this, the pt feels he rests well, and denies any issues with daytime alertness or sleepiness.  He is certainly at risk for osa with his large neck, obesity, and abnormal upper airway anatomy.  He also has htn and dm that can be negatively impacted by sleep disoredered breathing.  At this point, I do think there is enough suspicion to warrant sleep testing.  He has no underlying cardiac disease and appears to be an acceptable candidate for home sleep testing.  Will set this up.  Other Orders: Consultation Level IV (04540) Misc. Referral (Misc. Ref)  Patient Instructions: 1)  will schedule for home sleep study 2)  work on weight loss 3)  will call you with results when available.

## 2010-10-06 ENCOUNTER — Encounter: Payer: Self-pay | Admitting: Pulmonary Disease

## 2010-10-09 ENCOUNTER — Ambulatory Visit (INDEPENDENT_AMBULATORY_CARE_PROVIDER_SITE_OTHER): Payer: Medicare Other | Admitting: Pulmonary Disease

## 2010-10-09 ENCOUNTER — Encounter: Payer: Self-pay | Admitting: Pulmonary Disease

## 2010-10-09 DIAGNOSIS — G4733 Obstructive sleep apnea (adult) (pediatric): Secondary | ICD-10-CM

## 2010-10-11 ENCOUNTER — Telehealth: Payer: Self-pay | Admitting: Pulmonary Disease

## 2010-10-17 NOTE — Progress Notes (Signed)
Summary: pt requesting results of apnea link  Phone Note Call from Patient   Caller: Patient Call For: Dr. Shelle Iron Summary of Call: [phone] Pt calling wanting to know the results of his apnea link study. Pt used the device on 10/06/10 and would like to know results and/or what his next step will be. Please advise. Larry [name]  October 11, 2010 12:11 PM   Follow-up for Phone Call        let pt know that he has severe sleep apnea surprisingly, and needs ov to discuss options for treatment  Follow-up by: Barbaraann Share MD,  October 11, 2010 12:15 PM     Appended Document: pt requesting results of apnea link see above  Appended Document: pt requesting results of apnea link called and spoke with pt.  pt scheduled to see kc 10-23-2010 at 4:15pm

## 2010-10-23 ENCOUNTER — Encounter: Payer: Self-pay | Admitting: Pulmonary Disease

## 2010-10-23 ENCOUNTER — Ambulatory Visit (INDEPENDENT_AMBULATORY_CARE_PROVIDER_SITE_OTHER): Payer: Medicare Other | Admitting: Pulmonary Disease

## 2010-10-23 DIAGNOSIS — G4733 Obstructive sleep apnea (adult) (pediatric): Secondary | ICD-10-CM | POA: Insufficient documentation

## 2010-10-31 NOTE — Assessment & Plan Note (Signed)
Summary: home sleep testing shows AHI 42/hr   Copy to:  [facility] Primary Provider/Referring Provider:  Tresa Garter MD   History of Present Illness: the pt underwent home sleep testing with a type 3 portable device.  Airflow, heartrate, oxygen saturations, and effort were all monitored.  Data has been reviewed.  The pt had a flow and saturation evaluation period of approx. 7 hrs.  1) the pt was noted to have 190 apneas, the overwhelming majority of which were obstructive in nature.        He also had 121 obstructive hypopneas.  This gave him an AHI of 42/hr, c/w severe disease 2) there was oxygen desaturation as low as 805, and the pt spent 51 min < or = to 88%.    Allergies: 1)  ! Clindamycin Hcl (Clindamycin Hcl) 2)  ! 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   Other Orders: Sleep Std Airflow/Heartrate and O2 SAT unattended (04540)

## 2010-11-07 NOTE — Assessment & Plan Note (Signed)
Summary: ov to discuss apnea link results/mg   Copy to:  [facility] Primary Provider/Referring Provider:  Tresa Garter MD  CC:  Ov to discuss apnea link results..  History of Present Illness: the pt comes in today to review his recent home sleep study.  He was found to have severe osa with AHI 42/hr and desat to 80%.  I have reviewed the study in detail with him, and answered all of his questions.    Medications Prior to Update: 1)  Zocor 40 Mg  Tabs (Simvastatin) .... Once Daily 2)  Adult Aspirin Low Strength 81 Mg  Tbdp (Aspirin) .... Once Daily 3)  Lotensin Hct 20-25 Mg  Tabs (Benazepril-Hydrochlorothiazide) .... Once Daily 4)  Glucophage 1000 Mg  Tabs (Metformin Hcl) .... Two Times A Day 5)  Verapamil Hcl Cr 240 Mg  Cp24 (Verapamil Hcl) .... Once Daily 6)  Amaryl 1 Mg  Tabs (Glimepiride) .... Two Times A Day 7)  Vitamin D3 1000 Unit  Tabs (Cholecalciferol) .[name].. 1 By Mouth Daily 8)  Accu-Chek Aviva  Strp (Glucose Blood) .... Test Blood Glucose Twice A Day For Diabetes Management 9)  Icaps  Caps (Multiple Vitamins-Minerals) .[name].. 1 Two Times A Day 10)  Multivitamins  Tabs (Multiple Vitamin) .[name].. 1 Once Daily  Allergies (verified): 1)  ! Clindamycin Hcl (Clindamycin Hcl) 2)  ! Prednisone (Prednisone)  Review of Systems  The patient denies shortness of breath with activity, shortness of breath at rest, productive cough, non-productive cough, coughing up blood, chest pain, irregular heartbeats, acid heartburn, indigestion, loss of appetite, weight change, abdominal pain, difficulty swallowing, sore throat, tooth/dental problems, headaches, nasal congestion/difficulty breathing through nose, sneezing, itching, ear ache, anxiety, depression, hand/feet swelling, joint stiffness or pain, rash, change in color of mucus, and fever.    Vital Signs:  Patient profile:   70 year old male Height:      72 inches Weight:      262.50 pounds BMI:     35.73 O2 Sat:      95 % on Room  air Temp:     97.6 degrees F oral Pulse rate:   77 / minute BP sitting:   146 / 70  (left arm) Cuff size:   large  Vitals Entered By: Arman Filter LPN (October 22, 1608 4:01 PM)  O2 Flow:  Room air CC: Ov to discuss apnea link results. Comments Medications reviewed with patient Arman Filter LPN  October 23, 9602 4:01 PM    Physical Exam  General:  obese male in nad Extremities:  mild edema, no cyanosis  Neurologic:  awake, but sleepy, moves all 4.    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  I have recommeded treatment with cpap while working on weight loss, and the pt is agreeable to try.  I will set the patient up on cpap at a moderate pressure level to allow for desensitization, and will troubleshoot the device over the next 4-6weeks if needed.  The pt is to call me if having issues with tolerance.  Will then optimize the pressure once patient is able to wear cpap on a consistent basis.  Other Orders: Est. Patient Level III (54098) DME Referral (DME)  Patient Instructions: 1)  will set up on cpap at 10cm for next 5 weeks.  Please call if having issues with tolerance 2)  work on weight loss 3)  followup with  me in 5weeks.

## 2010-11-07 NOTE — Procedures (Signed)
Summary: ApneaLink  ApneaLink   Imported By: Lester Herriman 10/30/2010 08:17:36  _____________________________________________________________________  External Attachment:    Type:   Image     Comment:   External Document

## 2010-11-15 ENCOUNTER — Telehealth: Payer: Self-pay | Admitting: Pulmonary Disease

## 2010-11-15 NOTE — Telephone Encounter (Signed)
Patient states all of his questions have been answered by [facility] and nothing further is needed from our office at this time.

## 2010-11-27 ENCOUNTER — Encounter: Payer: Self-pay | Admitting: Pulmonary Disease

## 2010-11-28 ENCOUNTER — Ambulatory Visit (INDEPENDENT_AMBULATORY_CARE_PROVIDER_SITE_OTHER): Payer: Medicare Other | Admitting: Pulmonary Disease

## 2010-11-28 ENCOUNTER — Encounter: Payer: Self-pay | Admitting: Pulmonary Disease

## 2010-11-28 VITALS — BP 140/76 | HR 78 | Temp 97.5°F | Ht 72.0 in | Wt 263.8 lb

## 2010-11-28 DIAGNOSIS — G4733 Obstructive sleep apnea (adult) (pediatric): Secondary | ICD-10-CM

## 2010-11-28 NOTE — Patient Instructions (Signed)
Continue with cpap. Will set your machine on auto for next 2 weeks to optimize your pressure.  I will call you with results. Work on weight loss followup with me in 6mos if doing well

## 2010-11-28 NOTE — Assessment & Plan Note (Signed)
The pt has been wearing cpap compliantly, and has only seen a little improvement in his symptoms.  I have reminded him we have yet to optimize his pressure.  He is having no mask or pressure issues. Care Plan:  At this point, will arrange for the patient's machine to be changed over to auto mode for 2 weeks to optimize their pressure.  I will review the downloaded data once sent by dme, and also evaluate for compliance, leaks, and residual osa.  I will call the patient and dme to discuss the results, and have the patient's machine set appropriately.  This will serve as the pt's cpap pressure titration.

## 2010-11-28 NOTE — Progress Notes (Signed)
  Subjective:    Patient ID: Larry [name], male    DOB: 1941/04/22, 70 y.o.   MRN: [id number]  HPI The pt comes in today for f/u of his severe osa.  He has been started on cpap, and has been wearing compliantly.  He denies any mask or pressure issues.  He is not snoring any longer, but does not feel more rested in the am's upon arising.  He has noted better alertness and energy in late afternoons and early evening.     Review of Systems  Constitutional: Negative for fever, appetite change and unexpected weight change.  HENT: Negative for ear pain, nosebleeds, congestion, sore throat, rhinorrhea, sneezing, trouble swallowing, dental problem, postnasal drip and sinus pressure.   Eyes: Negative for redness and itching.  Respiratory: Negative for cough, chest tightness, shortness of breath and wheezing.   Cardiovascular: Negative for palpitations and leg swelling.  Gastrointestinal: Negative for nausea and vomiting.  Genitourinary: Negative for dysuria.  Musculoskeletal: Negative for joint swelling.  Skin: Negative for rash.  Neurological: Negative for headaches.  Hematological: Does not bruise/bleed easily.  Psychiatric/Behavioral: Negative for dysphoric mood. The patient is not nervous/anxious.        Objective:   Physical Exam Ow male in nad  No skin breakdown or pressure necrosis from cpap mask  Cor with rrr No LE edema, no cyanosis  Alert, does not appear sleepy, moves all 4        Assessment & Plan:

## 2010-12-21 ENCOUNTER — Other Ambulatory Visit: Payer: Self-pay | Admitting: Internal Medicine

## 2010-12-21 ENCOUNTER — Other Ambulatory Visit: Payer: Self-pay

## 2010-12-21 DIAGNOSIS — E119 Type 2 diabetes mellitus without complications: Secondary | ICD-10-CM

## 2010-12-30 ENCOUNTER — Other Ambulatory Visit: Payer: Self-pay | Admitting: Pulmonary Disease

## 2010-12-30 DIAGNOSIS — G4733 Obstructive sleep apnea (adult) (pediatric): Secondary | ICD-10-CM

## 2011-01-01 ENCOUNTER — Ambulatory Visit: Payer: Self-pay | Admitting: Internal Medicine

## 2011-01-05 NOTE — Assessment & Plan Note (Signed)
[facility]                                 ON-CALL NOTE   DIOR, STEPTER                      MRN:          [id number]  DATE:08/24/2006                            DOB:          [birth year]    The wife, Mrs. Tardif, calls from [phone], at 7:38 a.m. on August 24, 2006.  The patient's date of birth is [birth year].  The wife states  the patient had root canal done an was given clindamycin and Vicodin  afterwards.  On January 2, the patient ran out of Vicodin, was told to  take Etodolac, and broke out in a rash over the last couple of days.  It  started out on his abdomen and chest, and is now all over his body.  Other medications he takes are verapamil, Glucophage, and glimepiride.  I recommended he stop all the medications.  He could take some Benadryl  for the itching, and I would be happy to see him in the clinic this  morning if she felt we needed to.  Mr. Safley wife stated she would  bring him to the clinic this morning after 9 o'clock.     Lelon Perla, DO  Electronically Signed    Shawnie Dapper  DD: 08/24/2006  DT: 08/24/2006  Job #: [id number]   cc:   Georgina Quint. Plotnikov, MD

## 2011-01-05 NOTE — Procedures (Signed)
[facility]                                  PROCEDURE NOTE   ANTONYO, HINDERER                      MRN:          [id number]  DATE:05/30/2006                            DOB:          1940-09-09    PROCEDURE:  Left shoulder injection.   INDICATION:  Subacromial bursitis.   The risks including incomplete procedure, bleeding, infection and others, as  well as benefits, were explained to the patient in detail.  He agrees to  proceed.  The area was prepped with Betadine and alcohol, and injected in  the usual fashion with 80 mg of Depo-Medrol and 3 mL of 2% lidocaine with  epinephrine.  Tolerated well.   COMPLICATIONS:  None.   Good pain relief immediately following the procedure.            ______________________________  Georgina Quint. Plotnikov, MD      AVP/MedQ  DD:  05/30/2006  DT:  06/01/2006  Job #:  [id number]

## 2011-03-10 ENCOUNTER — Other Ambulatory Visit: Payer: Self-pay | Admitting: Internal Medicine

## 2011-03-31 ENCOUNTER — Other Ambulatory Visit: Payer: Self-pay | Admitting: Internal Medicine

## 2011-04-02 ENCOUNTER — Other Ambulatory Visit: Payer: Self-pay | Admitting: Internal Medicine

## 2011-05-29 ENCOUNTER — Encounter: Payer: Self-pay | Admitting: Pulmonary Disease

## 2011-05-29 ENCOUNTER — Ambulatory Visit (INDEPENDENT_AMBULATORY_CARE_PROVIDER_SITE_OTHER): Payer: Medicare Other | Admitting: Pulmonary Disease

## 2011-05-29 VITALS — BP 118/58 | HR 71 | Temp 97.7°F | Ht 72.0 in | Wt 264.0 lb

## 2011-05-29 DIAGNOSIS — G4733 Obstructive sleep apnea (adult) (pediatric): Secondary | ICD-10-CM

## 2011-05-29 NOTE — Assessment & Plan Note (Signed)
The patient is wearing CPAP compliantly at his optimal pressure.  He feels that he does sleep better with the device, and has increased daytime alertness and energy.  I have asked him to keep up with supplies and mask changes, and also to work aggressively on weight loss.  I will see him back in one year, or sooner if he is having issues.

## 2011-05-29 NOTE — Progress Notes (Signed)
  Subjective:    Patient ID: Larry [name], male    DOB: 07-22-1941, 70 y.o.   MRN: [id number]  HPI The patient comes in today for followup of his known sleep apnea.  He is wearing CPAP compliantly at his optimal pressure, and denies any issues with mask fit or pressure.  He feels that he is sleeping better, and has improved daytime alertness.   Review of Systems  Constitutional: Negative for fever and unexpected weight change.  HENT: Negative for ear pain, nosebleeds, congestion, sore throat, rhinorrhea, sneezing, trouble swallowing, dental problem, postnasal drip and sinus pressure.   Eyes: Negative for redness and itching.  Respiratory: Negative for cough, chest tightness, shortness of breath and wheezing.   Cardiovascular: Negative for palpitations and leg swelling.  Gastrointestinal: Negative for nausea and vomiting.  Genitourinary: Negative for dysuria.  Musculoskeletal: Negative for joint swelling.  Skin: Negative for rash.  Neurological: Negative for headaches.  Hematological: Does not bruise/bleed easily.  Psychiatric/Behavioral: Negative for dysphoric mood. The patient is not nervous/anxious.        Objective:   Physical Exam Obese male in no acute distress No skin breakdown or pressure necrosis from the CPAP mask Nose without purulence or discharge Lower extremities without edema, no cyanosis noted Alert and oriented, moves all 4 extremities.       Assessment & Plan:

## 2011-05-29 NOTE — Patient Instructions (Signed)
Continue with cpap, work on weight reduction Keep up with mask changes and supplies. followup with me in one year.

## 2011-07-02 ENCOUNTER — Other Ambulatory Visit: Payer: Self-pay | Admitting: Internal Medicine

## 2011-07-04 ENCOUNTER — Encounter: Payer: Self-pay | Admitting: Internal Medicine

## 2011-07-13 ENCOUNTER — Other Ambulatory Visit (INDEPENDENT_AMBULATORY_CARE_PROVIDER_SITE_OTHER): Payer: Medicare Other

## 2011-07-13 ENCOUNTER — Encounter: Payer: Self-pay | Admitting: Internal Medicine

## 2011-07-13 ENCOUNTER — Telehealth: Payer: Self-pay | Admitting: Internal Medicine

## 2011-07-13 ENCOUNTER — Ambulatory Visit (INDEPENDENT_AMBULATORY_CARE_PROVIDER_SITE_OTHER): Payer: Medicare Other | Admitting: Internal Medicine

## 2011-07-13 VITALS — BP 122/80 | HR 70 | Temp 97.5°F | Wt 262.0 lb

## 2011-07-13 DIAGNOSIS — C61 Malignant neoplasm of prostate: Secondary | ICD-10-CM

## 2011-07-13 DIAGNOSIS — E119 Type 2 diabetes mellitus without complications: Secondary | ICD-10-CM

## 2011-07-13 DIAGNOSIS — Z0000 Encounter for general adult medical examination without abnormal findings: Secondary | ICD-10-CM

## 2011-07-13 DIAGNOSIS — E785 Hyperlipidemia, unspecified: Secondary | ICD-10-CM

## 2011-07-13 DIAGNOSIS — Z23 Encounter for immunization: Secondary | ICD-10-CM

## 2011-07-13 LAB — CBC WITH DIFFERENTIAL/PLATELET
Basophils Relative: 0.8 % (ref 0.0–3.0)
Eosinophils Absolute: 0.2 10*3/uL (ref 0.0–0.7)
Eosinophils Relative: 2.2 % (ref 0.0–5.0)
Hemoglobin: 14.2 g/dL (ref 13.0–17.0)
Lymphocytes Relative: 31.5 % (ref 12.0–46.0)
MCHC: 33.2 g/dL (ref 30.0–36.0)
MCV: 90.6 fl (ref 78.0–100.0)
Monocytes Absolute: 0.7 10*3/uL (ref 0.1–1.0)
Neutro Abs: 6 10*3/uL (ref 1.4–7.7)
Neutrophils Relative %: 58.3 % (ref 43.0–77.0)
RBC: 4.73 Mil/uL (ref 4.22–5.81)
WBC: 10.3 10*3/uL (ref 4.5–10.5)

## 2011-07-13 LAB — URINALYSIS
Hgb urine dipstick: NEGATIVE
Leukocytes, UA: NEGATIVE
Nitrite: NEGATIVE
Specific Gravity, Urine: >=1.03 (ref 1.000–1.030)
Urobilinogen, UA: 0.2 (ref 0.0–1.0)

## 2011-07-13 LAB — BASIC METABOLIC PANEL
CO2: 29 mEq/L (ref 19–32)
Chloride: 102 mEq/L (ref 96–112)
Potassium: 4.5 mEq/L (ref 3.5–5.1)
Sodium: 141 mEq/L (ref 135–145)

## 2011-07-13 LAB — LIPID PANEL
Cholesterol: 159 mg/dL (ref 0–200)
LDL Cholesterol: 82 mg/dL (ref 0–99)
Total CHOL/HDL Ratio: 4
Triglycerides: 162 mg/dL — ABNORMAL HIGH (ref 0.0–149.0)
VLDL: 32.4 mg/dL (ref 0.0–40.0)

## 2011-07-13 LAB — HEPATIC FUNCTION PANEL
Albumin: 3.9 g/dL (ref 3.5–5.2)
Alkaline Phosphatase: 86 U/L (ref 39–117)
Total Protein: 6.7 g/dL (ref 6.0–8.3)

## 2011-07-13 LAB — PSA: PSA: 0 ng/mL — ABNORMAL LOW (ref 0.10–4.00)

## 2011-07-13 MED ORDER — METFORMIN HCL 1000 MG PO TABS: 1000.0000 mg | ORAL_TABLET | Freq: Two times a day (BID) | ORAL | Status: DC

## 2011-07-13 MED ORDER — PNEUMOCOCCAL VAC POLYVALENT 25 MCG/0.5ML IJ INJ: 0.5000 mL | INJECTION | Freq: Once | INTRAMUSCULAR | Status: DC

## 2011-07-13 MED ORDER — GLIMEPIRIDE 1 MG PO TABS: 1.0000 mg | ORAL_TABLET | Freq: Two times a day (BID) | ORAL | Status: DC

## 2011-07-13 MED ORDER — VERAPAMIL HCL ER 240 MG PO TBCR: 240.0000 mg | EXTENDED_RELEASE_TABLET | Freq: Every day | ORAL | Status: DC

## 2011-07-13 MED ORDER — GLUCOSE BLOOD VI STRP: ORAL_STRIP | Status: DC

## 2011-07-13 MED ORDER — BENAZEPRIL-HYDROCHLOROTHIAZIDE 20-25 MG PO TABS: 1.0000 | ORAL_TABLET | Freq: Every day | ORAL | Status: DC

## 2011-07-13 MED ORDER — SIMVASTATIN 40 MG PO TABS: 40.0000 mg | ORAL_TABLET | Freq: Every day | ORAL | Status: DC

## 2011-07-13 NOTE — Progress Notes (Signed)
  Subjective:    Patient ID: Larry [name], male    DOB: 1940/10/20, 70 y.o.   MRN: [id number]  HPI  The patient is here for a wellness exam. The patient has been doing well overall without major physical or psychological issues going on lately. The patient needs to address  chronic hypertension that has been well controlled with medicines; to address chronic  hyperlipidemia controlled with medicines as well; and to address type 2 chronic diabetes, controlled with medical treatment and diet.   Review of Systems  Constitutional: Negative for appetite change, fatigue and unexpected weight change.  HENT: Negative for nosebleeds, congestion, sore throat, sneezing, trouble swallowing and neck pain.   Eyes: Negative for itching and visual disturbance.  Respiratory: Negative for cough.   Cardiovascular: Negative for chest pain, palpitations and leg swelling.  Gastrointestinal: Negative for nausea, diarrhea, blood in stool and abdominal distention.  Genitourinary: Negative for frequency and hematuria.  Musculoskeletal: Negative for back pain, joint swelling and gait problem.  Skin: Negative for rash.  Neurological: Negative for dizziness, tremors, speech difficulty and weakness.  Psychiatric/Behavioral: Negative for sleep disturbance, dysphoric mood and agitation. The patient is not nervous/anxious.    Wt Readings from Last 3 Encounters:  07/13/11 262 lb (118.842 kg)  05/29/11 264 lb (119.75 kg)  11/28/10 263 lb 12.8 oz (119.659 kg)        Objective:   Physical Exam  Constitutional: He is oriented to person, place, and time. He appears well-developed and well-nourished. No distress.  HENT:  Head: Normocephalic and atraumatic.  Right Ear: External ear normal.  Left Ear: External ear normal.  Nose: Nose normal.  Mouth/Throat: Oropharynx is clear and moist. No oropharyngeal exudate.  Eyes: Conjunctivae and EOM are normal. Pupils are equal, round, and reactive to light. Right eye exhibits  no discharge. Left eye exhibits no discharge. No scleral icterus.  Neck: Normal range of motion. Neck supple. No JVD present. No tracheal deviation present. No thyromegaly present.  Cardiovascular: Normal rate, regular rhythm, normal heart sounds and intact distal pulses.  Exam reveals no gallop and no friction rub.   No murmur heard. Pulmonary/Chest: Effort normal and breath sounds normal. No stridor. No respiratory distress. He has no wheezes. He has no rales. He exhibits no tenderness.  Abdominal: Soft. Bowel sounds are normal. He exhibits no distension and no mass. There is no tenderness. There is no rebound and no guarding.  Genitourinary: Rectum normal and penis normal. Guaiac negative stool. No penile tenderness.       Prostate is not present  Musculoskeletal: Normal range of motion. He exhibits no edema and no tenderness.  Lymphadenopathy:    He has no cervical adenopathy.  Neurological: He is alert and oriented to person, place, and time. He has normal reflexes. No cranial nerve deficit. He exhibits normal muscle tone. Coordination normal.  Skin: Skin is warm and dry. No rash noted. He is not diaphoretic. No erythema. No pallor.  Psychiatric: He has a normal mood and affect. His behavior is normal. Judgment and thought content normal.          Assessment & Plan:

## 2011-07-13 NOTE — Telephone Encounter (Signed)
Larry [name], please, inform patient that all labs are OK  Please, mail the labs to the patient.    Thx

## 2011-07-13 NOTE — Telephone Encounter (Signed)
Pt advised of result and copy of labs mailed as requested. Address verified.

## 2011-07-13 NOTE — Assessment & Plan Note (Signed)
The patient is here for annual Medicare wellness examination and management of other chronic and acute problems.   The risk factors are reflected in the social history.  The roster of all physicians providing medical care to patient - is listed in the Snapshot section of the chart.  Activities of daily living:  The patient is 100% inedpendent in all ADLs: dressing, toileting, feeding as well as independent mobility  Home safety : The patient has smoke detectors in the home. They wear seatbelts.No firearms at home ( firearms are present in the home, kept in a safe fashion). There is no violence in the home.   There is no risks for hepatitis, STDs or HIV. There is no   history of blood transfusion. They have no travel history to infectious disease endemic areas of the world.  The patient has (has not) seen their dentist in the last six month. They have (not) seen their eye doctor in the last year. They deny (admit to) any hearing difficulty and have not had audiologic testing in the last year.  They do not  have excessive sun exposure. Discussed the need for sun protection: hats, long sleeves and use of sunscreen if there is significant sun exposure.   Diet: the importance of a healthy diet is discussed. They do have a healthy (unhealthy-high fat/fast food) diet.  The patient has a regular exercise program: planning to start.  The benefits of regular aerobic exercise were discussed.  Depression screen: there are no signs or vegative symptoms of depression- irritability, change in appetite, anhedonia, sadness/tearfullness.  Cognitive assessment: the patient manages all their financial and personal affairs and is actively engaged. They could relate day,date,year and events; recalled 3/3 objects at 3 minutes; performed clock-face test normally.  The following portions of the patient's history were reviewed and updated as appropriate: allergies, current medications, past family history, past medical  history,  past surgical history, past social history  and problem list.  Vision, hearing, body mass index were assessed and reviewed.   During the course of the visit the patient was educated and counseled about appropriate screening and preventive services including : fall prevention , diabetes screening, nutrition counseling, colorectal cancer screening, and recommended immunizations.

## 2011-07-13 NOTE — Patient Instructions (Signed)
Wt Readings from Last 3 Encounters:  07/13/11 262 lb (118.842 kg)  05/29/11 264 lb (119.75 kg)  11/28/10 263 lb 12.8 oz (119.659 kg)

## 2011-07-13 NOTE — Assessment & Plan Note (Signed)
Continue with current prescription therapy as reflected on the Med list.  

## 2011-09-25 DIAGNOSIS — Z85828 Personal history of other malignant neoplasm of skin: Secondary | ICD-10-CM | POA: Diagnosis not present

## 2011-09-25 DIAGNOSIS — L570 Actinic keratosis: Secondary | ICD-10-CM | POA: Diagnosis not present

## 2011-09-28 DIAGNOSIS — H40019 Open angle with borderline findings, low risk, unspecified eye: Secondary | ICD-10-CM | POA: Diagnosis not present

## 2011-09-28 DIAGNOSIS — E119 Type 2 diabetes mellitus without complications: Secondary | ICD-10-CM | POA: Diagnosis not present

## 2011-09-28 DIAGNOSIS — H35039 Hypertensive retinopathy, unspecified eye: Secondary | ICD-10-CM | POA: Diagnosis not present

## 2011-10-04 ENCOUNTER — Telehealth: Payer: Self-pay | Admitting: Internal Medicine

## 2011-10-04 NOTE — Telephone Encounter (Signed)
Received copies from [facility] 10/04/11. Forwarded 2pages to Dr. Martha Clan review.

## 2011-11-09 ENCOUNTER — Ambulatory Visit: Payer: Medicare Other | Admitting: Internal Medicine

## 2011-11-09 DIAGNOSIS — Z0289 Encounter for other administrative examinations: Secondary | ICD-10-CM

## 2012-02-26 DIAGNOSIS — L570 Actinic keratosis: Secondary | ICD-10-CM | POA: Diagnosis not present

## 2012-02-26 DIAGNOSIS — Z85828 Personal history of other malignant neoplasm of skin: Secondary | ICD-10-CM | POA: Diagnosis not present

## 2012-04-23 ENCOUNTER — Ambulatory Visit (INDEPENDENT_AMBULATORY_CARE_PROVIDER_SITE_OTHER): Payer: Medicare Other | Admitting: Internal Medicine

## 2012-04-23 ENCOUNTER — Encounter: Payer: Self-pay | Admitting: Internal Medicine

## 2012-04-23 ENCOUNTER — Other Ambulatory Visit (INDEPENDENT_AMBULATORY_CARE_PROVIDER_SITE_OTHER): Payer: Medicare Other

## 2012-04-23 VITALS — BP 140/74 | HR 76 | Temp 98.4°F | Resp 16 | Wt 265.0 lb

## 2012-04-23 DIAGNOSIS — R209 Unspecified disturbances of skin sensation: Secondary | ICD-10-CM

## 2012-04-23 DIAGNOSIS — R202 Paresthesia of skin: Secondary | ICD-10-CM

## 2012-04-23 DIAGNOSIS — E785 Hyperlipidemia, unspecified: Secondary | ICD-10-CM

## 2012-04-23 DIAGNOSIS — E119 Type 2 diabetes mellitus without complications: Secondary | ICD-10-CM | POA: Diagnosis not present

## 2012-04-23 DIAGNOSIS — I10 Essential (primary) hypertension: Secondary | ICD-10-CM | POA: Diagnosis not present

## 2012-04-23 DIAGNOSIS — R609 Edema, unspecified: Secondary | ICD-10-CM

## 2012-04-23 DIAGNOSIS — G4733 Obstructive sleep apnea (adult) (pediatric): Secondary | ICD-10-CM

## 2012-04-23 LAB — LIPID PANEL
LDL Cholesterol: 62 mg/dL (ref 0–99)
Total CHOL/HDL Ratio: 3
VLDL: 30.2 mg/dL (ref 0.0–40.0)

## 2012-04-23 LAB — HEPATIC FUNCTION PANEL
ALT: 48 U/L (ref 0–53)
AST: 42 U/L — ABNORMAL HIGH (ref 0–37)
Alkaline Phosphatase: 64 U/L (ref 39–117)
Bilirubin, Direct: 0.1 mg/dL (ref 0.0–0.3)
Total Bilirubin: 0.6 mg/dL (ref 0.3–1.2)

## 2012-04-23 LAB — BASIC METABOLIC PANEL
CO2: 27 mEq/L (ref 19–32)
Chloride: 102 mEq/L (ref 96–112)
Potassium: 3.5 mEq/L (ref 3.5–5.1)
Sodium: 137 mEq/L (ref 135–145)

## 2012-04-23 LAB — HEMOGLOBIN A1C: Hgb A1c MFr Bld: 6.9 % — ABNORMAL HIGH (ref 4.6–6.5)

## 2012-04-23 LAB — VITAMIN B12: Vitamin B-12: 663 pg/mL (ref 211–911)

## 2012-04-23 MED ORDER — FUROSEMIDE 20 MG PO TABS: 20.0000 mg | ORAL_TABLET | Freq: Every day | ORAL | Status: DC | PRN

## 2012-04-23 MED ORDER — AMLODIPINE BESYLATE 5 MG PO TABS: 5.0000 mg | ORAL_TABLET | Freq: Every day | ORAL | Status: DC

## 2012-04-23 NOTE — Assessment & Plan Note (Signed)
We may need to reduce Verapamil

## 2012-04-23 NOTE — Progress Notes (Signed)
Subjective:    Patient ID: Larry [name], male    DOB: 1941/05/12, 71 y.o.   MRN: [id number]  HPI   The patient needs to address  chronic hypertension that has been well controlled with medicines; to address chronic  hyperlipidemia controlled with medicines as well; and to address type 2 chronic diabetes, controlled with medical treatment and diet. C/o tiredness - poss depressed. C/o leg swelling   Review of Systems  Constitutional: Negative for appetite change, fatigue and unexpected weight change.  HENT: Negative for nosebleeds, congestion, sore throat, sneezing, trouble swallowing and neck pain.   Eyes: Negative for itching and visual disturbance.  Respiratory: Negative for cough.   Cardiovascular: Negative for chest pain, palpitations and leg swelling.  Gastrointestinal: Negative for nausea, diarrhea, blood in stool and abdominal distention.  Genitourinary: Negative for frequency and hematuria.  Musculoskeletal: Negative for back pain, joint swelling and gait problem.  Skin: Negative for rash.  Neurological: Negative for dizziness, tremors, speech difficulty and weakness.  Psychiatric/Behavioral: Negative for disturbed wake/sleep cycle, dysphoric mood and agitation. The patient is not nervous/anxious.    Wt Readings from Last 3 Encounters:  04/23/12 265 lb (120.203 kg)  07/13/11 262 lb (118.842 kg)  05/29/11 264 lb (119.75 kg)   BP Readings from Last 3 Encounters:  04/23/12 140/74  07/13/11 122/80  05/29/11 118/58        Objective:   Physical Exam  Constitutional: He is oriented to person, place, and time. He appears well-developed and well-nourished. No distress.  HENT:  Head: Normocephalic and atraumatic.  Right Ear: External ear normal.  Left Ear: External ear normal.  Nose: Nose normal.  Mouth/Throat: Oropharynx is clear and moist. No oropharyngeal exudate.  Eyes: Conjunctivae and EOM are normal. Pupils are equal, round, and reactive to light. Right eye  exhibits no discharge. Left eye exhibits no discharge. No scleral icterus.  Neck: Normal range of motion. Neck supple. No JVD present. No tracheal deviation present. No thyromegaly present.  Cardiovascular: Normal rate, regular rhythm, normal heart sounds and intact distal pulses.  Exam reveals no gallop and no friction rub.   No murmur heard. Pulmonary/Chest: Effort normal and breath sounds normal. No stridor. No respiratory distress. He has no wheezes. He has no rales. He exhibits no tenderness.  Abdominal: Soft. Bowel sounds are normal. He exhibits no distension and no mass. There is no tenderness. There is no rebound and no guarding.  Genitourinary: Rectum normal and penis normal. Guaiac negative stool. No penile tenderness.       Prostate is not present  Musculoskeletal: Normal range of motion. He exhibits no edema and no tenderness.  Lymphadenopathy:    He has no cervical adenopathy.  Neurological: He is alert and oriented to person, place, and time. He has normal reflexes. No cranial nerve deficit. He exhibits normal muscle tone. Coordination normal.  Skin: Skin is warm and dry. No rash noted. He is not diaphoretic. No erythema. No pallor.  Psychiatric: He has a normal mood and affect. His behavior is normal. Judgment and thought content normal.  1+ ankle swelling B  Lab Results  Component Value Date   WBC 10.3 07/13/2011   HGB 14.2 07/13/2011   HCT 42.9 07/13/2011   PLT 286.0 07/13/2011   GLUCOSE 136* 07/13/2011   CHOL 159 07/13/2011   TRIG 162.0* 07/13/2011   HDL 44.50 07/13/2011   LDLCALC 82 07/13/2011   ALT 35 07/13/2011   AST 27 07/13/2011   NA 141 07/13/2011  K 4.5 07/13/2011   CL 102 07/13/2011   CREATININE 1.2 07/13/2011   BUN 21 07/13/2011   CO2 29 07/13/2011   TSH 1.47 07/13/2011   PSA 0.00* 07/13/2011   HGBA1C 6.9* 07/13/2011            Assessment & Plan:

## 2012-04-23 NOTE — Assessment & Plan Note (Signed)
Better BP at home

## 2012-04-23 NOTE — Assessment & Plan Note (Signed)
Continue with current prescription therapy as reflected on the Med list.  

## 2012-04-23 NOTE — Assessment & Plan Note (Signed)
On CPAP. ?

## 2012-05-07 ENCOUNTER — Telehealth: Payer: Self-pay | Admitting: Internal Medicine

## 2012-05-07 MED ORDER — GLUCOSE BLOOD VI STRP: ORAL_STRIP | Status: DC

## 2012-05-07 MED ORDER — METFORMIN HCL 1000 MG PO TABS: 1000.0000 mg | ORAL_TABLET | Freq: Two times a day (BID) | ORAL | Status: DC

## 2012-05-07 MED ORDER — GLIMEPIRIDE 1 MG PO TABS: 1.0000 mg | ORAL_TABLET | Freq: Two times a day (BID) | ORAL | Status: DC

## 2012-05-07 MED ORDER — BENAZEPRIL-HYDROCHLOROTHIAZIDE 20-25 MG PO TABS: 1.0000 | ORAL_TABLET | Freq: Every day | ORAL | Status: DC

## 2012-05-07 NOTE — Telephone Encounter (Signed)
Needs Rx

## 2012-05-20 DIAGNOSIS — L570 Actinic keratosis: Secondary | ICD-10-CM | POA: Diagnosis not present

## 2012-05-29 ENCOUNTER — Ambulatory Visit: Payer: Medicare Other | Admitting: Pulmonary Disease

## 2012-07-23 ENCOUNTER — Other Ambulatory Visit: Payer: Self-pay | Admitting: Internal Medicine

## 2012-07-24 DIAGNOSIS — M545 Low back pain: Secondary | ICD-10-CM | POA: Diagnosis not present

## 2012-07-24 DIAGNOSIS — M999 Biomechanical lesion, unspecified: Secondary | ICD-10-CM | POA: Diagnosis not present

## 2012-07-24 DIAGNOSIS — IMO0001 Reserved for inherently not codable concepts without codable children: Secondary | ICD-10-CM | POA: Diagnosis not present

## 2012-07-25 ENCOUNTER — Other Ambulatory Visit: Payer: Self-pay | Admitting: Internal Medicine

## 2012-08-26 DIAGNOSIS — L570 Actinic keratosis: Secondary | ICD-10-CM | POA: Diagnosis not present

## 2012-08-27 ENCOUNTER — Telehealth: Payer: Self-pay | Admitting: Internal Medicine

## 2012-08-27 ENCOUNTER — Ambulatory Visit (INDEPENDENT_AMBULATORY_CARE_PROVIDER_SITE_OTHER): Payer: Medicare Other | Admitting: Internal Medicine

## 2012-08-27 ENCOUNTER — Encounter: Payer: Self-pay | Admitting: Internal Medicine

## 2012-08-27 ENCOUNTER — Other Ambulatory Visit (INDEPENDENT_AMBULATORY_CARE_PROVIDER_SITE_OTHER): Payer: Medicare Other

## 2012-08-27 VITALS — BP 130/70 | HR 80 | Temp 98.0°F | Resp 16 | Wt 260.0 lb

## 2012-08-27 DIAGNOSIS — H6120 Impacted cerumen, unspecified ear: Secondary | ICD-10-CM

## 2012-08-27 DIAGNOSIS — R7309 Other abnormal glucose: Secondary | ICD-10-CM

## 2012-08-27 DIAGNOSIS — J329 Chronic sinusitis, unspecified: Secondary | ICD-10-CM

## 2012-08-27 DIAGNOSIS — Z23 Encounter for immunization: Secondary | ICD-10-CM | POA: Diagnosis not present

## 2012-08-27 DIAGNOSIS — R609 Edema, unspecified: Secondary | ICD-10-CM

## 2012-08-27 DIAGNOSIS — E119 Type 2 diabetes mellitus without complications: Secondary | ICD-10-CM | POA: Diagnosis not present

## 2012-08-27 DIAGNOSIS — R739 Hyperglycemia, unspecified: Secondary | ICD-10-CM

## 2012-08-27 DIAGNOSIS — I10 Essential (primary) hypertension: Secondary | ICD-10-CM

## 2012-08-27 LAB — BASIC METABOLIC PANEL
CO2: 30 mEq/L (ref 19–32)
Calcium: 9.8 mg/dL (ref 8.4–10.5)
Chloride: 102 mEq/L (ref 96–112)
Creatinine, Ser: 1.1 mg/dL (ref 0.4–1.5)
Glucose, Bld: 156 mg/dL — ABNORMAL HIGH (ref 70–99)

## 2012-08-27 MED ORDER — METFORMIN HCL 1000 MG PO TABS: 1000.0000 mg | ORAL_TABLET | Freq: Two times a day (BID) | ORAL | Status: DC

## 2012-08-27 MED ORDER — FLUTICASONE PROPIONATE 50 MCG/ACT NA SUSP: 2.0000 | Freq: Every day | NASAL | Status: DC

## 2012-08-27 MED ORDER — AMLODIPINE BESYLATE 5 MG PO TABS: 5.0000 mg | ORAL_TABLET | Freq: Every day | ORAL | Status: DC

## 2012-08-27 MED ORDER — BENAZEPRIL-HYDROCHLOROTHIAZIDE 20-25 MG PO TABS: 1.0000 | ORAL_TABLET | Freq: Every day | ORAL | Status: DC

## 2012-08-27 MED ORDER — FUROSEMIDE 20 MG PO TABS: 20.0000 mg | ORAL_TABLET | Freq: Every day | ORAL | Status: DC | PRN

## 2012-08-27 MED ORDER — GLIMEPIRIDE 1 MG PO TABS: 1.0000 mg | ORAL_TABLET | Freq: Two times a day (BID) | ORAL | Status: DC

## 2012-08-27 MED ORDER — AMOXICILLIN 500 MG PO CAPS: 1000.0000 mg | ORAL_CAPSULE | Freq: Two times a day (BID) | ORAL | Status: DC

## 2012-08-27 MED ORDER — SIMVASTATIN 40 MG PO TABS: 40.0000 mg | ORAL_TABLET | Freq: Every day | ORAL | Status: DC

## 2012-08-27 NOTE — Assessment & Plan Note (Signed)
Continue with current prescription therapy as reflected on the Med list.  

## 2012-08-27 NOTE — Assessment & Plan Note (Signed)
Better  

## 2012-08-27 NOTE — Telephone Encounter (Signed)
Left detailed mess informing pt of below.  

## 2012-08-27 NOTE — Assessment & Plan Note (Signed)
Amoxicillin Flonase

## 2012-08-27 NOTE — Progress Notes (Signed)
Subjective:    HPI   The patient needs to address  chronic hypertension that has been well controlled with medicines; to address chronic  hyperlipidemia controlled with medicines as well; and to address type 2 chronic diabetes, controlled with medical treatment and diet. C/o tiredness - poss depressed. F/u leg swelling - better C/o "plugged ears", brown nasal d/c x 2 wks   Review of Systems  Constitutional: Negative for appetite change, fatigue and unexpected weight change.  HENT: Negative for nosebleeds, congestion, sore throat, sneezing, trouble swallowing and neck pain.   Eyes: Negative for itching and visual disturbance.  Respiratory: Negative for cough.   Cardiovascular: Negative for chest pain, palpitations and leg swelling.  Gastrointestinal: Negative for nausea, diarrhea, blood in stool and abdominal distention.  Genitourinary: Negative for frequency and hematuria.  Musculoskeletal: Negative for back pain, joint swelling and gait problem.  Skin: Negative for rash.  Neurological: Negative for dizziness, tremors, speech difficulty and weakness.  Psychiatric/Behavioral: Negative for sleep disturbance, dysphoric mood and agitation. The patient is not nervous/anxious.    Wt Readings from Last 3 Encounters:  08/27/12 260 lb (117.935 kg)  04/23/12 265 lb (120.203 kg)  07/13/11 262 lb (118.842 kg)   BP Readings from Last 3 Encounters:  08/27/12 130/70  04/23/12 140/74  07/13/11 122/80        Objective:   Physical Exam  Constitutional: He is oriented to person, place, and time. He appears well-developed and well-nourished. No distress.  HENT:  Head: Normocephalic and atraumatic.  Right Ear: External ear normal.  Left Ear: External ear normal.  Nose: Nose normal.  Mouth/Throat: Oropharynx is clear and moist. No oropharyngeal exudate.  Eyes: Conjunctivae normal and EOM are normal. Pupils are equal, round, and reactive to light. Right eye exhibits no discharge. Left eye  exhibits no discharge. No scleral icterus.  Neck: Normal range of motion. Neck supple. No JVD present. No tracheal deviation present. No thyromegaly present.  Cardiovascular: Normal rate, regular rhythm, normal heart sounds and intact distal pulses.  Exam reveals no gallop and no friction rub.   No murmur heard. Pulmonary/Chest: Effort normal and breath sounds normal. No stridor. No respiratory distress. He has no wheezes. He has no rales. He exhibits no tenderness.  Abdominal: Soft. Bowel sounds are normal. He exhibits no distension and no mass. There is no tenderness. There is no rebound and no guarding.  Genitourinary: Rectum normal and penis normal. Guaiac negative stool. No penile tenderness.       Prostate is not present  Musculoskeletal: Normal range of motion. He exhibits no edema and no tenderness.  Lymphadenopathy:    He has no cervical adenopathy.  Neurological: He is alert and oriented to person, place, and time. He has normal reflexes. No cranial nerve deficit. He exhibits normal muscle tone. Coordination normal.  Skin: Skin is warm and dry. No rash noted. He is not diaphoretic. No erythema. No pallor.  Psychiatric: He has a normal mood and affect. His behavior is normal. Judgment and thought content normal.  Trace to 1+ ankle swelling B Wax B  Swollen nasal mucosa  Lab Results  Component Value Date   WBC 10.3 07/13/2011   HGB 14.2 07/13/2011   HCT 42.9 07/13/2011   PLT 286.0 07/13/2011   GLUCOSE 146* 04/23/2012   CHOL 139 04/23/2012   TRIG 151.0* 04/23/2012   HDL 46.40 04/23/2012   LDLCALC 62 04/23/2012   ALT 48 04/23/2012   AST 42* 04/23/2012   NA 137 04/23/2012  K 3.5 04/23/2012   CL 102 04/23/2012   CREATININE 1.2 04/23/2012   BUN 20 04/23/2012   CO2 27 04/23/2012   TSH 1.72 04/23/2012   PSA 0.00* 07/13/2011   HGBA1C 6.9* 04/23/2012     Procedure Note :     Procedure :  Ear irrigation   Indication:  Cerumen impaction   Risks, including pain, dizziness, eardrum perforation,  bleeding, infection and others as well as benefits were explained to the patient in detail. Verbal consent was obtained and the patient agreed to proceed.    We used "The [employer] Device" field with lukewarm water for irrigation. A large amount wax was recovered. Procedure has also required manual wax removal with an ear loop.   Tolerated well. Complications: None.   Postprocedure instructions :  Call if problems.          Assessment & Plan:

## 2012-08-27 NOTE — Assessment & Plan Note (Signed)
Will irrigate - see procedure 

## 2012-08-27 NOTE — Telephone Encounter (Signed)
Larry [name], please, inform patient that all labs are normal except for the sugar control labs - he needs to loose 10 lbs Thx

## 2012-09-22 ENCOUNTER — Telehealth: Payer: Self-pay | Admitting: Internal Medicine

## 2012-09-22 NOTE — Telephone Encounter (Signed)
Patient would like to know if he had a PSA performed at his last visit, he is going to see his urologist and does not want to have this test repeated

## 2012-09-23 NOTE — Telephone Encounter (Signed)
Last PSA was checked her 06/2011. I left a detailed message informing pt of this on his mobile number.

## 2012-09-29 ENCOUNTER — Encounter: Payer: Self-pay | Admitting: Internal Medicine

## 2012-09-29 DIAGNOSIS — H353190 Nonexudative age-related macular degeneration, unspecified eye, stage unspecified: Secondary | ICD-10-CM | POA: Diagnosis not present

## 2012-09-29 DIAGNOSIS — H40019 Open angle with borderline findings, low risk, unspecified eye: Secondary | ICD-10-CM | POA: Diagnosis not present

## 2012-09-29 DIAGNOSIS — H35039 Hypertensive retinopathy, unspecified eye: Secondary | ICD-10-CM | POA: Diagnosis not present

## 2012-09-29 LAB — HM DIABETES EYE EXAM

## 2012-09-30 DIAGNOSIS — Z8546 Personal history of malignant neoplasm of prostate: Secondary | ICD-10-CM | POA: Diagnosis not present

## 2012-10-07 DIAGNOSIS — N529 Male erectile dysfunction, unspecified: Secondary | ICD-10-CM | POA: Diagnosis not present

## 2012-10-07 DIAGNOSIS — Z8546 Personal history of malignant neoplasm of prostate: Secondary | ICD-10-CM | POA: Diagnosis not present

## 2012-12-26 ENCOUNTER — Ambulatory Visit (INDEPENDENT_AMBULATORY_CARE_PROVIDER_SITE_OTHER): Payer: Medicare Other | Admitting: Internal Medicine

## 2012-12-26 ENCOUNTER — Encounter: Payer: Self-pay | Admitting: Internal Medicine

## 2012-12-26 ENCOUNTER — Other Ambulatory Visit (INDEPENDENT_AMBULATORY_CARE_PROVIDER_SITE_OTHER): Payer: Medicare Other

## 2012-12-26 VITALS — BP 140/68 | HR 76 | Temp 98.1°F | Resp 16 | Wt 261.0 lb

## 2012-12-26 DIAGNOSIS — Z8546 Personal history of malignant neoplasm of prostate: Secondary | ICD-10-CM

## 2012-12-26 DIAGNOSIS — E119 Type 2 diabetes mellitus without complications: Secondary | ICD-10-CM | POA: Diagnosis not present

## 2012-12-26 DIAGNOSIS — J329 Chronic sinusitis, unspecified: Secondary | ICD-10-CM

## 2012-12-26 DIAGNOSIS — I10 Essential (primary) hypertension: Secondary | ICD-10-CM | POA: Diagnosis not present

## 2012-12-26 LAB — BASIC METABOLIC PANEL
BUN: 21 mg/dL (ref 6–23)
CO2: 27 mEq/L (ref 19–32)
Chloride: 100 mEq/L (ref 96–112)
Creatinine, Ser: 1.1 mg/dL (ref 0.4–1.5)
Glucose, Bld: 160 mg/dL — ABNORMAL HIGH (ref 70–99)

## 2012-12-26 MED ORDER — AMOXICILLIN 500 MG PO CAPS: 1000.0000 mg | ORAL_CAPSULE | Freq: Two times a day (BID) | ORAL | Status: DC

## 2012-12-26 NOTE — Assessment & Plan Note (Signed)
Continue with current prescription therapy as reflected on the Med list.  

## 2012-12-26 NOTE — Assessment & Plan Note (Addendum)
Monitoring PSA 

## 2012-12-26 NOTE — Progress Notes (Signed)
Subjective:    HPI   The patient needs to address  chronic hypertension that has been well controlled with medicines; to address chronic  hyperlipidemia controlled with medicines as well; and to address type 2 chronic diabetes, controlled with medical treatment and diet. C/o tiredness - poss depressed. F/u leg swelling - better C/o sinusitis sx's   Review of Systems  Constitutional: Negative for appetite change, fatigue and unexpected weight change.  HENT: Negative for nosebleeds, congestion, sore throat, sneezing, trouble swallowing and neck pain.   Eyes: Negative for itching and visual disturbance.  Respiratory: Negative for cough.   Cardiovascular: Negative for chest pain, palpitations and leg swelling.  Gastrointestinal: Negative for nausea, diarrhea, blood in stool and abdominal distention.  Genitourinary: Negative for frequency and hematuria.  Musculoskeletal: Negative for back pain, joint swelling and gait problem.  Skin: Negative for rash.  Neurological: Negative for dizziness, tremors, speech difficulty and weakness.  Psychiatric/Behavioral: Negative for sleep disturbance, dysphoric mood and agitation. The patient is not nervous/anxious.    Wt Readings from Last 3 Encounters:  12/26/12 261 lb (118.389 kg)  08/27/12 260 lb (117.935 kg)  04/23/12 265 lb (120.203 kg)   BP Readings from Last 3 Encounters:  12/26/12 140/68  08/27/12 130/70  04/23/12 140/74        Objective:   Physical Exam  Constitutional: He is oriented to person, place, and time. He appears well-developed and well-nourished. No distress.  HENT:  Head: Normocephalic and atraumatic.  Right Ear: External ear normal.  Left Ear: External ear normal.  Nose: Nose normal.  Mouth/Throat: Oropharynx is clear and moist. No oropharyngeal exudate.  Eyes: Conjunctivae and EOM are normal. Pupils are equal, round, and reactive to light. Right eye exhibits no discharge. Left eye exhibits no discharge. No scleral  icterus.  Neck: Normal range of motion. Neck supple. No JVD present. No tracheal deviation present. No thyromegaly present.  Cardiovascular: Normal rate, regular rhythm, normal heart sounds and intact distal pulses.  Exam reveals no gallop and no friction rub.   No murmur heard. Pulmonary/Chest: Effort normal and breath sounds normal. No stridor. No respiratory distress. He has no wheezes. He has no rales. He exhibits no tenderness.  Abdominal: Soft. Bowel sounds are normal. He exhibits no distension and no mass. There is no tenderness. There is no rebound and no guarding.  Genitourinary: Rectum normal and penis normal. Guaiac negative stool. No penile tenderness.  Prostate is not present  Musculoskeletal: Normal range of motion. He exhibits no edema and no tenderness.  Lymphadenopathy:    He has no cervical adenopathy.  Neurological: He is alert and oriented to person, place, and time. He has normal reflexes. No cranial nerve deficit. He exhibits normal muscle tone. Coordination normal.  Skin: Skin is warm and dry. No rash noted. He is not diaphoretic. No erythema. No pallor.  Psychiatric: He has a normal mood and affect. His behavior is normal. Judgment and thought content normal.  Trace to 1+ ankle swelling B Swollen nasal mucosa   Lab Results  Component Value Date   WBC 10.3 07/13/2011   HGB 14.2 07/13/2011   HCT 42.9 07/13/2011   PLT 286.0 07/13/2011   GLUCOSE 156* 08/27/2012   CHOL 139 04/23/2012   TRIG 151.0* 04/23/2012   HDL 46.40 04/23/2012   LDLCALC 62 04/23/2012   ALT 48 04/23/2012   AST 42* 04/23/2012   NA 140 08/27/2012   K 3.9 08/27/2012   CL 102 08/27/2012   CREATININE 1.1 08/27/2012  BUN 20 08/27/2012   CO2 30 08/27/2012   TSH 1.72 04/23/2012   PSA 0.00* 07/13/2011   HGBA1C 7.4* 08/27/2012             Assessment & Plan:

## 2012-12-26 NOTE — Assessment & Plan Note (Signed)
Start abx

## 2013-01-14 DIAGNOSIS — M7730 Calcaneal spur, unspecified foot: Secondary | ICD-10-CM | POA: Diagnosis not present

## 2013-01-14 DIAGNOSIS — M722 Plantar fascial fibromatosis: Secondary | ICD-10-CM | POA: Diagnosis not present

## 2013-02-04 DIAGNOSIS — M722 Plantar fascial fibromatosis: Secondary | ICD-10-CM | POA: Diagnosis not present

## 2013-02-04 DIAGNOSIS — M79609 Pain in unspecified limb: Secondary | ICD-10-CM | POA: Diagnosis not present

## 2013-02-24 DIAGNOSIS — Z85828 Personal history of other malignant neoplasm of skin: Secondary | ICD-10-CM | POA: Diagnosis not present

## 2013-02-24 DIAGNOSIS — C44319 Basal cell carcinoma of skin of other parts of face: Secondary | ICD-10-CM | POA: Diagnosis not present

## 2013-02-24 DIAGNOSIS — L570 Actinic keratosis: Secondary | ICD-10-CM | POA: Diagnosis not present

## 2013-03-26 DIAGNOSIS — M722 Plantar fascial fibromatosis: Secondary | ICD-10-CM | POA: Diagnosis not present

## 2013-03-26 DIAGNOSIS — M7150 Other bursitis, not elsewhere classified, unspecified site: Secondary | ICD-10-CM | POA: Diagnosis not present

## 2013-04-29 ENCOUNTER — Ambulatory Visit (INDEPENDENT_AMBULATORY_CARE_PROVIDER_SITE_OTHER): Payer: Medicare Other | Admitting: Internal Medicine

## 2013-04-29 ENCOUNTER — Encounter: Payer: Self-pay | Admitting: Internal Medicine

## 2013-04-29 ENCOUNTER — Other Ambulatory Visit (INDEPENDENT_AMBULATORY_CARE_PROVIDER_SITE_OTHER): Payer: Medicare Other

## 2013-04-29 VITALS — BP 128/88 | HR 72 | Temp 97.0°F | Resp 16 | Wt 255.0 lb

## 2013-04-29 DIAGNOSIS — M545 Low back pain: Secondary | ICD-10-CM | POA: Diagnosis not present

## 2013-04-29 DIAGNOSIS — I10 Essential (primary) hypertension: Secondary | ICD-10-CM | POA: Diagnosis not present

## 2013-04-29 DIAGNOSIS — Z23 Encounter for immunization: Secondary | ICD-10-CM

## 2013-04-29 DIAGNOSIS — E119 Type 2 diabetes mellitus without complications: Secondary | ICD-10-CM

## 2013-04-29 DIAGNOSIS — Z8546 Personal history of malignant neoplasm of prostate: Secondary | ICD-10-CM

## 2013-04-29 DIAGNOSIS — R609 Edema, unspecified: Secondary | ICD-10-CM

## 2013-04-29 LAB — TSH: TSH: 1.72 u[IU]/mL (ref 0.35–5.50)

## 2013-04-29 LAB — BASIC METABOLIC PANEL
BUN: 23 mg/dL (ref 6–23)
CO2: 29 mEq/L (ref 19–32)
Glucose, Bld: 148 mg/dL — ABNORMAL HIGH (ref 70–99)
Potassium: 3.8 mEq/L (ref 3.5–5.1)
Sodium: 138 mEq/L (ref 135–145)

## 2013-04-29 MED ORDER — FLUTICASONE PROPIONATE 50 MCG/ACT NA SUSP: 2.0000 | Freq: Every day | NASAL | Status: DC

## 2013-04-29 MED ORDER — SIMVASTATIN 40 MG PO TABS: 40.0000 mg | ORAL_TABLET | Freq: Every day | ORAL | Status: DC

## 2013-04-29 MED ORDER — VERAPAMIL HCL 180 MG (CO) PO TB24: 180.0000 mg | ORAL_TABLET | Freq: Every day | ORAL | Status: DC

## 2013-04-29 MED ORDER — FUROSEMIDE 20 MG PO TABS: 20.0000 mg | ORAL_TABLET | Freq: Every day | ORAL | Status: DC | PRN

## 2013-04-29 MED ORDER — GLIMEPIRIDE 1 MG PO TABS: 1.0000 mg | ORAL_TABLET | Freq: Two times a day (BID) | ORAL | Status: DC

## 2013-04-29 MED ORDER — METFORMIN HCL 1000 MG PO TABS: 1000.0000 mg | ORAL_TABLET | Freq: Two times a day (BID) | ORAL | Status: DC

## 2013-04-29 MED ORDER — GLUCOSE BLOOD VI STRP: ORAL_STRIP | Status: DC

## 2013-04-29 MED ORDER — AMLODIPINE BESYLATE 5 MG PO TABS: 5.0000 mg | ORAL_TABLET | Freq: Every day | ORAL | Status: DC

## 2013-04-29 MED ORDER — BENAZEPRIL-HYDROCHLOROTHIAZIDE 20-25 MG PO TABS: 1.0000 | ORAL_TABLET | Freq: Every day | ORAL | Status: DC

## 2013-04-29 NOTE — Assessment & Plan Note (Signed)
No relapse 

## 2013-04-29 NOTE — Assessment & Plan Note (Signed)
Continue with current prescription therapy as reflected on the Med list.  

## 2013-04-29 NOTE — Assessment & Plan Note (Signed)
Stable

## 2013-04-29 NOTE — Progress Notes (Signed)
Subjective:    HPI   The patient needs to address  chronic hypertension that has been well controlled with medicines; to address chronic  hyperlipidemia controlled with medicines as well; and to address type 2 chronic diabetes, controlled with medical treatment and diet. C/o tiredness - poss depressed. F/u leg swelling.   Review of Systems  Constitutional: Negative for appetite change, fatigue and unexpected weight change.  HENT: Negative for nosebleeds, congestion, sore throat, sneezing, trouble swallowing and neck pain.   Eyes: Negative for itching and visual disturbance.  Respiratory: Negative for cough.   Cardiovascular: Negative for chest pain, palpitations and leg swelling.  Gastrointestinal: Negative for nausea, diarrhea, blood in stool and abdominal distention.  Genitourinary: Negative for frequency and hematuria.  Musculoskeletal: Negative for back pain, joint swelling and gait problem.  Skin: Negative for rash.  Neurological: Negative for dizziness, tremors, speech difficulty and weakness.  Psychiatric/Behavioral: Negative for sleep disturbance, dysphoric mood and agitation. The patient is not nervous/anxious.    Wt Readings from Last 3 Encounters:  04/29/13 255 lb (115.667 kg)  12/26/12 261 lb (118.389 kg)  08/27/12 260 lb (117.935 kg)   BP Readings from Last 3 Encounters:  04/29/13 128/88  12/26/12 140/68  08/27/12 130/70        Objective:   Physical Exam  Constitutional: He is oriented to person, place, and time. He appears well-developed and well-nourished. No distress.  HENT:  Head: Normocephalic and atraumatic.  Right Ear: External ear normal.  Left Ear: External ear normal.  Nose: Nose normal.  Mouth/Throat: Oropharynx is clear and moist. No oropharyngeal exudate.  Eyes: Conjunctivae and EOM are normal. Pupils are equal, round, and reactive to light. Right eye exhibits no discharge. Left eye exhibits no discharge. No scleral icterus.  Neck: Normal  range of motion. Neck supple. No JVD present. No tracheal deviation present. No thyromegaly present.  Cardiovascular: Normal rate, regular rhythm, normal heart sounds and intact distal pulses.  Exam reveals no gallop and no friction rub.   No murmur heard. Pulmonary/Chest: Effort normal and breath sounds normal. No stridor. No respiratory distress. He has no wheezes. He has no rales. He exhibits no tenderness.  Abdominal: Soft. Bowel sounds are normal. He exhibits no distension and no mass. There is no tenderness. There is no rebound and no guarding.  Genitourinary: Rectum normal and penis normal. Guaiac negative stool. No penile tenderness.  Prostate is not present  Musculoskeletal: Normal range of motion. He exhibits no edema and no tenderness.  Lymphadenopathy:    He has no cervical adenopathy.  Neurological: He is alert and oriented to person, place, and time. He has normal reflexes. No cranial nerve deficit. He exhibits normal muscle tone. Coordination normal.  Skin: Skin is warm and dry. No rash noted. He is not diaphoretic. No erythema. No pallor.  Psychiatric: He has a normal mood and affect. His behavior is normal. Judgment and thought content normal.  Trace to 1+ ankle swelling B Swollen nasal mucosa   Lab Results  Component Value Date   WBC 10.3 07/13/2011   HGB 14.2 07/13/2011   HCT 42.9 07/13/2011   PLT 286.0 07/13/2011   GLUCOSE 160* 12/26/2012   CHOL 139 04/23/2012   TRIG 151.0* 04/23/2012   HDL 46.40 04/23/2012   LDLCALC 62 04/23/2012   ALT 48 04/23/2012   AST 42* 04/23/2012   NA 137 12/26/2012   K 4.3 12/26/2012   CL 100 12/26/2012   CREATININE 1.1 12/26/2012   BUN 21 12/26/2012  CO2 27 12/26/2012   TSH 1.72 04/23/2012   PSA 0.00 Repeated and verified X2.* 12/26/2012   HGBA1C 7.8* 12/26/2012             Assessment & Plan:

## 2013-04-29 NOTE — Assessment & Plan Note (Signed)
Monitoring PSA 

## 2013-05-05 DIAGNOSIS — M722 Plantar fascial fibromatosis: Secondary | ICD-10-CM | POA: Diagnosis not present

## 2013-05-08 ENCOUNTER — Other Ambulatory Visit: Payer: Self-pay | Admitting: *Deleted

## 2013-05-08 MED ORDER — FREESTYLE SYSTEM KIT: 1.0000 | PACK | Status: DC | PRN

## 2013-05-08 MED ORDER — LANCETS MISC: 1.0000 | Freq: Two times a day (BID) | Status: DC

## 2013-05-08 MED ORDER — GLUCOSE BLOOD VI STRP: 1.0000 | ORAL_STRIP | Freq: Two times a day (BID) | Status: DC

## 2013-06-02 ENCOUNTER — Ambulatory Visit: Payer: Self-pay | Admitting: Podiatry

## 2013-06-09 ENCOUNTER — Ambulatory Visit: Payer: Self-pay | Admitting: Podiatry

## 2013-07-06 DIAGNOSIS — H02839 Dermatochalasis of unspecified eye, unspecified eyelid: Secondary | ICD-10-CM | POA: Diagnosis not present

## 2013-07-06 DIAGNOSIS — H40019 Open angle with borderline findings, low risk, unspecified eye: Secondary | ICD-10-CM | POA: Diagnosis not present

## 2013-09-01 ENCOUNTER — Other Ambulatory Visit (INDEPENDENT_AMBULATORY_CARE_PROVIDER_SITE_OTHER): Payer: Medicare Other

## 2013-09-01 ENCOUNTER — Ambulatory Visit: Payer: Medicare Other | Admitting: Internal Medicine

## 2013-09-01 ENCOUNTER — Ambulatory Visit (INDEPENDENT_AMBULATORY_CARE_PROVIDER_SITE_OTHER): Payer: Medicare Other | Admitting: Internal Medicine

## 2013-09-01 ENCOUNTER — Encounter: Payer: Self-pay | Admitting: Internal Medicine

## 2013-09-01 VITALS — BP 148/72 | HR 80 | Temp 96.8°F | Resp 16 | Wt 261.0 lb

## 2013-09-01 DIAGNOSIS — T148XXA Other injury of unspecified body region, initial encounter: Secondary | ICD-10-CM | POA: Insufficient documentation

## 2013-09-01 DIAGNOSIS — E119 Type 2 diabetes mellitus without complications: Secondary | ICD-10-CM | POA: Diagnosis not present

## 2013-09-01 DIAGNOSIS — I10 Essential (primary) hypertension: Secondary | ICD-10-CM | POA: Diagnosis not present

## 2013-09-01 DIAGNOSIS — L570 Actinic keratosis: Secondary | ICD-10-CM | POA: Diagnosis not present

## 2013-09-01 DIAGNOSIS — E785 Hyperlipidemia, unspecified: Secondary | ICD-10-CM

## 2013-09-01 DIAGNOSIS — Z8546 Personal history of malignant neoplasm of prostate: Secondary | ICD-10-CM | POA: Diagnosis not present

## 2013-09-01 DIAGNOSIS — Z85828 Personal history of other malignant neoplasm of skin: Secondary | ICD-10-CM | POA: Diagnosis not present

## 2013-09-01 LAB — BASIC METABOLIC PANEL
BUN: 21 mg/dL (ref 6–23)
CHLORIDE: 101 meq/L (ref 96–112)
CO2: 31 mEq/L (ref 19–32)
Calcium: 10 mg/dL (ref 8.4–10.5)
Creatinine, Ser: 1.1 mg/dL (ref 0.4–1.5)
GFR: 69.97 mL/min (ref >60.00)
Glucose, Bld: 135 mg/dL — ABNORMAL HIGH (ref 70–99)
POTASSIUM: 3.8 meq/L (ref 3.5–5.1)
Sodium: 140 mEq/L (ref 135–145)

## 2013-09-01 LAB — HEMOGLOBIN A1C: HEMOGLOBIN A1C: 7.6 % — AB (ref 4.6–6.5)

## 2013-09-01 MED ORDER — VITAMIN C 500 MG PO TABS: 500.0000 mg | ORAL_TABLET | Freq: Every day | ORAL | Status: DC

## 2013-09-01 NOTE — Progress Notes (Signed)
Pre visit review using our clinic review tool, if applicable. No additional management support is needed unless otherwise documented below in the visit note. 

## 2013-09-01 NOTE — Assessment & Plan Note (Signed)
Continue with current prescription therapy as reflected on the Med list.  

## 2013-09-01 NOTE — Assessment & Plan Note (Signed)
Doing well 

## 2013-09-01 NOTE — Assessment & Plan Note (Signed)
D/c ASA or/and try Vit C

## 2013-09-01 NOTE — Progress Notes (Signed)
Subjective:    HPI   The patient needs to address  chronic hypertension that has been well controlled with medicines; to address chronic  hyperlipidemia controlled with medicines as well; and to address type 2 chronic diabetes, controlled with medical treatment and diet. C/o slight tiredness - poss depressed. F/u leg swelling.   Review of Systems  Constitutional: Negative for appetite change, fatigue and unexpected weight change.  HENT: Negative for congestion, nosebleeds, sneezing, sore throat and trouble swallowing.   Eyes: Negative for itching and visual disturbance.  Respiratory: Negative for cough.   Cardiovascular: Negative for chest pain, palpitations and leg swelling.  Gastrointestinal: Negative for nausea, diarrhea, blood in stool and abdominal distention.  Genitourinary: Negative for frequency and hematuria.  Musculoskeletal: Negative for back pain, gait problem, joint swelling and neck pain.  Skin: Negative for rash.  Neurological: Negative for dizziness, tremors, speech difficulty and weakness.  Psychiatric/Behavioral: Negative for sleep disturbance, dysphoric mood and agitation. The patient is not nervous/anxious.    Wt Readings from Last 3 Encounters:  09/01/13 261 lb (118.389 kg)  04/29/13 255 lb (115.667 kg)  12/26/12 261 lb (118.389 kg)   BP Readings from Last 3 Encounters:  09/01/13 148/72  04/29/13 128/88  12/26/12 140/68        Objective:   Physical Exam  Constitutional: He is oriented to person, place, and time. He appears well-developed and well-nourished. No distress.  HENT:  Head: Normocephalic and atraumatic.  Right Ear: External ear normal.  Left Ear: External ear normal.  Nose: Nose normal.  Mouth/Throat: Oropharynx is clear and moist. No oropharyngeal exudate.  Eyes: Conjunctivae and EOM are normal. Pupils are equal, round, and reactive to light. Right eye exhibits no discharge. Left eye exhibits no discharge. No scleral icterus.  Neck:  Normal range of motion. Neck supple. No JVD present. No tracheal deviation present. No thyromegaly present.  Cardiovascular: Normal rate, regular rhythm, normal heart sounds and intact distal pulses.  Exam reveals no gallop and no friction rub.   No murmur heard. Pulmonary/Chest: Effort normal and breath sounds normal. No stridor. No respiratory distress. He has no wheezes. He has no rales. He exhibits no tenderness.  Abdominal: Soft. Bowel sounds are normal. He exhibits no distension and no mass. There is no tenderness. There is no rebound and no guarding.  Genitourinary: Rectum normal and penis normal. Guaiac negative stool. No penile tenderness.  Prostate is not present  Musculoskeletal: Normal range of motion. He exhibits no edema and no tenderness.  Lymphadenopathy:    He has no cervical adenopathy.  Neurological: He is alert and oriented to person, place, and time. He has normal reflexes. No cranial nerve deficit. He exhibits normal muscle tone. Coordination normal.  Skin: Skin is warm and dry. No rash noted. He is not diaphoretic. No erythema. No pallor.  Psychiatric: He has a normal mood and affect. His behavior is normal. Judgment and thought content normal.  Trace to 1+ ankle swelling B Swollen nasal mucosa Bruises on hands   Lab Results  Component Value Date   WBC 10.3 07/13/2011   HGB 14.2 07/13/2011   HCT 42.9 07/13/2011   PLT 286.0 07/13/2011   GLUCOSE 148* 04/29/2013   CHOL 139 04/23/2012   TRIG 151.0* 04/23/2012   HDL 46.40 04/23/2012   LDLCALC 62 04/23/2012   ALT 48 04/23/2012   AST 42* 04/23/2012   NA 138 04/29/2013   K 3.8 04/29/2013   CL 101 04/29/2013   CREATININE 1.1 04/29/2013  BUN 23 04/29/2013   CO2 29 04/29/2013   TSH 1.72 04/29/2013   PSA 0.00 Repeated and verified X2.* 12/26/2012   HGBA1C 7.7* 04/29/2013             Assessment & Plan:

## 2013-09-29 ENCOUNTER — Other Ambulatory Visit: Payer: Self-pay | Admitting: *Deleted

## 2013-09-29 MED ORDER — AMLODIPINE BESYLATE 5 MG PO TABS: 5.0000 mg | ORAL_TABLET | Freq: Every day | ORAL | Status: DC

## 2013-10-06 ENCOUNTER — Ambulatory Visit: Payer: Medicare Other

## 2013-10-13 ENCOUNTER — Ambulatory Visit: Payer: Medicare Other

## 2013-11-23 ENCOUNTER — Other Ambulatory Visit: Payer: Self-pay | Admitting: Internal Medicine

## 2013-11-25 DIAGNOSIS — Z8546 Personal history of malignant neoplasm of prostate: Secondary | ICD-10-CM | POA: Diagnosis not present

## 2013-11-25 DIAGNOSIS — N529 Male erectile dysfunction, unspecified: Secondary | ICD-10-CM | POA: Diagnosis not present

## 2013-12-02 DIAGNOSIS — Z8546 Personal history of malignant neoplasm of prostate: Secondary | ICD-10-CM | POA: Diagnosis not present

## 2013-12-02 DIAGNOSIS — N529 Male erectile dysfunction, unspecified: Secondary | ICD-10-CM | POA: Diagnosis not present

## 2013-12-08 DIAGNOSIS — H40019 Open angle with borderline findings, low risk, unspecified eye: Secondary | ICD-10-CM | POA: Diagnosis not present

## 2013-12-08 DIAGNOSIS — I7090 Unspecified atherosclerosis: Secondary | ICD-10-CM | POA: Diagnosis not present

## 2013-12-08 DIAGNOSIS — H353190 Nonexudative age-related macular degeneration, unspecified eye, stage unspecified: Secondary | ICD-10-CM | POA: Diagnosis not present

## 2013-12-08 DIAGNOSIS — E119 Type 2 diabetes mellitus without complications: Secondary | ICD-10-CM | POA: Diagnosis not present

## 2013-12-08 DIAGNOSIS — H524 Presbyopia: Secondary | ICD-10-CM | POA: Diagnosis not present

## 2013-12-10 DIAGNOSIS — N529 Male erectile dysfunction, unspecified: Secondary | ICD-10-CM | POA: Diagnosis not present

## 2013-12-31 ENCOUNTER — Ambulatory Visit: Payer: Medicare Other | Admitting: Internal Medicine

## 2013-12-31 DIAGNOSIS — Z0289 Encounter for other administrative examinations: Secondary | ICD-10-CM

## 2014-02-22 ENCOUNTER — Telehealth: Payer: Self-pay

## 2014-02-22 DIAGNOSIS — E119 Type 2 diabetes mellitus without complications: Secondary | ICD-10-CM

## 2014-02-22 NOTE — Telephone Encounter (Signed)
Diabetic bundle-lipid, bmet and a1c ordered LM for pt TCB and schedule appt

## 2014-03-16 ENCOUNTER — Other Ambulatory Visit: Payer: Self-pay | Admitting: Internal Medicine

## 2014-03-30 ENCOUNTER — Telehealth: Payer: Self-pay

## 2014-03-30 NOTE — Telephone Encounter (Signed)
LVM for pt to reschedule office visit.   Diabetic Bundle Pt.

## 2014-04-07 ENCOUNTER — Other Ambulatory Visit (INDEPENDENT_AMBULATORY_CARE_PROVIDER_SITE_OTHER): Payer: Medicare Other

## 2014-04-07 ENCOUNTER — Encounter: Payer: Self-pay | Admitting: Internal Medicine

## 2014-04-07 ENCOUNTER — Ambulatory Visit (INDEPENDENT_AMBULATORY_CARE_PROVIDER_SITE_OTHER): Payer: Medicare Other | Admitting: Internal Medicine

## 2014-04-07 VITALS — BP 140/68 | HR 72 | Temp 98.3°F | Resp 16 | Wt 250.0 lb

## 2014-04-07 DIAGNOSIS — E785 Hyperlipidemia, unspecified: Secondary | ICD-10-CM

## 2014-04-07 DIAGNOSIS — E119 Type 2 diabetes mellitus without complications: Secondary | ICD-10-CM | POA: Diagnosis not present

## 2014-04-07 DIAGNOSIS — I10 Essential (primary) hypertension: Secondary | ICD-10-CM | POA: Diagnosis not present

## 2014-04-07 DIAGNOSIS — R609 Edema, unspecified: Secondary | ICD-10-CM

## 2014-04-07 DIAGNOSIS — Z23 Encounter for immunization: Secondary | ICD-10-CM | POA: Diagnosis not present

## 2014-04-07 LAB — LIPID PANEL
Cholesterol: 128 mg/dL (ref 0–200)
HDL: 38.3 mg/dL — ABNORMAL LOW (ref >39.00)
LDL CALC: 63 mg/dL (ref 0–99)
NonHDL: 89.7
TRIGLYCERIDES: 135 mg/dL (ref 0.0–149.0)
Total CHOL/HDL Ratio: 3
VLDL: 27 mg/dL (ref 0.0–40.0)

## 2014-04-07 LAB — BASIC METABOLIC PANEL
BUN: 19 mg/dL (ref 6–23)
CALCIUM: 9.5 mg/dL (ref 8.4–10.5)
CO2: 31 mEq/L (ref 19–32)
Chloride: 102 mEq/L (ref 96–112)
Creatinine, Ser: 1.1 mg/dL (ref 0.4–1.5)
GFR: 67.04 mL/min (ref >60.00)
Glucose, Bld: 133 mg/dL — ABNORMAL HIGH (ref 70–99)
Potassium: 4 mEq/L (ref 3.5–5.1)
SODIUM: 139 meq/L (ref 135–145)

## 2014-04-07 LAB — HEMOGLOBIN A1C: Hgb A1c MFr Bld: 7.5 % — ABNORMAL HIGH (ref 4.6–6.5)

## 2014-04-07 MED ORDER — GLIMEPIRIDE 1 MG PO TABS: 1.0000 mg | ORAL_TABLET | Freq: Two times a day (BID) | ORAL | Status: DC

## 2014-04-07 MED ORDER — FLUTICASONE PROPIONATE 50 MCG/ACT NA SUSP: 2.0000 | Freq: Every day | NASAL | Status: DC

## 2014-04-07 MED ORDER — BENAZEPRIL-HYDROCHLOROTHIAZIDE 20-25 MG PO TABS: 1.0000 | ORAL_TABLET | Freq: Every day | ORAL | Status: DC

## 2014-04-07 MED ORDER — SIMVASTATIN 40 MG PO TABS: 40.0000 mg | ORAL_TABLET | Freq: Every day | ORAL | Status: DC

## 2014-04-07 MED ORDER — VERAPAMIL HCL 180 MG (CO) PO TB24: 180.0000 mg | ORAL_TABLET | Freq: Every day | ORAL | Status: DC

## 2014-04-07 MED ORDER — METFORMIN HCL 1000 MG PO TABS: ORAL_TABLET | ORAL | Status: DC

## 2014-04-07 NOTE — Assessment & Plan Note (Signed)
Continue with current prescription therapy as reflected on the Med list.  

## 2014-04-07 NOTE — Assessment & Plan Note (Addendum)
Larry [name] is not interested in changing meds at this point. Continue with current prescription therapy as reflected on the Med list.

## 2014-04-07 NOTE — Progress Notes (Signed)
Subjective:    HPI   The patient needs to address  chronic hypertension that has been well controlled with medicines; to address chronic  hyperlipidemia controlled with medicines as well; and to address type 2 chronic diabetes, controlled with medical treatment and diet. C/o slight tiredness - poss depressed. F/u leg swelling - no change. [name] went to [place] - summer 2015  Review of Systems  Constitutional: Negative for appetite change, fatigue and unexpected weight change.  HENT: Negative for congestion, nosebleeds, sneezing, sore throat and trouble swallowing.   Eyes: Negative for itching and visual disturbance.  Respiratory: Negative for cough.   Cardiovascular: Negative for chest pain, palpitations and leg swelling.  Gastrointestinal: Negative for nausea, diarrhea, blood in stool and abdominal distention.  Genitourinary: Negative for frequency and hematuria.  Musculoskeletal: Negative for back pain, gait problem, joint swelling and neck pain.  Skin: Negative for rash.  Neurological: Negative for dizziness, tremors, speech difficulty and weakness.  Psychiatric/Behavioral: Negative for sleep disturbance, dysphoric mood and agitation. The patient is not nervous/anxious.    Wt Readings from Last 3 Encounters:  04/07/14 250 lb (113.399 kg)  09/01/13 261 lb (118.389 kg)  04/29/13 255 lb (115.667 kg)   BP Readings from Last 3 Encounters:  04/07/14 140/68  09/01/13 148/72  04/29/13 128/88        Objective:   Physical Exam  Constitutional: He is oriented to person, place, and time. He appears well-developed and well-nourished. No distress.  HENT:  Head: Normocephalic and atraumatic.  Right Ear: External ear normal.  Left Ear: External ear normal.  Nose: Nose normal.  Mouth/Throat: Oropharynx is clear and moist. No oropharyngeal exudate.  Eyes: Conjunctivae and EOM are normal. Pupils are equal, round, and reactive to light. Right eye exhibits no discharge. Left eye exhibits  no discharge. No scleral icterus.  Neck: Normal range of motion. Neck supple. No JVD present. No tracheal deviation present. No thyromegaly present.  Cardiovascular: Normal rate, regular rhythm, normal heart sounds and intact distal pulses.  Exam reveals no gallop and no friction rub.   No murmur heard. Pulmonary/Chest: Effort normal and breath sounds normal. No stridor. No respiratory distress. He has no wheezes. He has no rales. He exhibits no tenderness.  Abdominal: Soft. Bowel sounds are normal. He exhibits no distension and no mass. There is no tenderness. There is no rebound and no guarding.  Genitourinary: Rectum normal and penis normal.  Musculoskeletal: Normal range of motion. He exhibits edema. He exhibits no tenderness.  1+ B ankle  Lymphadenopathy:    He has no cervical adenopathy.  Neurological: He is alert and oriented to person, place, and time. He has normal reflexes. No cranial nerve deficit. He exhibits normal muscle tone. Coordination normal.  Skin: Skin is warm and dry. No rash noted. He is not diaphoretic. No erythema. No pallor.  Psychiatric: He has a normal mood and affect. His behavior is normal. Judgment and thought content normal.  Trace to 1+ ankle swelling B Swollen nasal mucosa Bruises on hands   Lab Results  Component Value Date   WBC 10.3 07/13/2011   HGB 14.2 07/13/2011   HCT 42.9 07/13/2011   PLT 286.0 07/13/2011   GLUCOSE 135* 09/01/2013   CHOL 139 04/23/2012   TRIG 151.0* 04/23/2012   HDL 46.40 04/23/2012   LDLCALC 62 04/23/2012   ALT 48 04/23/2012   AST 42* 04/23/2012   NA 140 09/01/2013   K 3.8 09/01/2013   CL 101 09/01/2013   CREATININE 1.1  09/01/2013   BUN 21 09/01/2013   CO2 31 09/01/2013   TSH 1.72 04/29/2013   PSA 0.00 Repeated and verified X2.* 12/26/2012   HGBA1C 7.6* 09/01/2013             Assessment & Plan:

## 2014-04-07 NOTE — Progress Notes (Signed)
Pre visit review using our clinic review tool, if applicable. No additional management support is needed unless otherwise documented below in the visit note. 

## 2014-04-15 ENCOUNTER — Telehealth: Payer: Self-pay | Admitting: Geriatric Medicine

## 2014-04-15 NOTE — Telephone Encounter (Signed)
Message copied by Alger Memos on Thu Apr 15, 2014  2:41 PM ------      Message from: Cassandria Anger      Created: Thu Apr 08, 2014  5:59 PM       Erline Levine, please, inform patient that all labs are OK. Hgb A1c is better: cont w/wt loss      Thx       ------

## 2014-04-15 NOTE — Telephone Encounter (Signed)
Left message for patient to return my call to get lab results.

## 2014-04-27 ENCOUNTER — Ambulatory Visit: Payer: Self-pay | Admitting: Podiatry

## 2014-04-27 ENCOUNTER — Ambulatory Visit (INDEPENDENT_AMBULATORY_CARE_PROVIDER_SITE_OTHER): Payer: Medicare Other | Admitting: Podiatry

## 2014-04-27 ENCOUNTER — Encounter: Payer: Self-pay | Admitting: Podiatry

## 2014-04-27 VITALS — BP 128/58 | HR 81 | Resp 16

## 2014-04-27 DIAGNOSIS — M722 Plantar fascial fibromatosis: Secondary | ICD-10-CM | POA: Diagnosis not present

## 2014-04-27 MED ORDER — MELOXICAM 15 MG PO TABS: 15.0000 mg | ORAL_TABLET | Freq: Every day | ORAL | Status: DC

## 2014-04-27 NOTE — Patient Instructions (Signed)

## 2014-04-27 NOTE — Progress Notes (Signed)
He presents today with a chief complaint of a painful heel left foot. States that I was doing too much coughing in [place].  Objective: Vital signs are stable he is alert and oriented x3 pulses are palpable left. He has pain on palpation medial continued tubercle of the left heel.  Assessment: Plantar fasciitis left.  Plan: Injected left heel today with Kenalog and local anesthetic. Followup with him as needed. Started him a meloxicam. And dispensed a night splint.

## 2014-04-30 ENCOUNTER — Telehealth: Payer: Self-pay

## 2014-04-30 NOTE — Telephone Encounter (Signed)
LVM for pt to call back.   RE: Scheduling nurse visit for bp recheck.

## 2014-05-11 DIAGNOSIS — L570 Actinic keratosis: Secondary | ICD-10-CM | POA: Diagnosis not present

## 2014-05-11 DIAGNOSIS — Z85828 Personal history of other malignant neoplasm of skin: Secondary | ICD-10-CM | POA: Diagnosis not present

## 2014-05-13 ENCOUNTER — Other Ambulatory Visit: Payer: Self-pay | Admitting: Internal Medicine

## 2014-05-25 ENCOUNTER — Ambulatory Visit: Payer: Medicare Other | Admitting: Podiatry

## 2014-11-09 DIAGNOSIS — Z08 Encounter for follow-up examination after completed treatment for malignant neoplasm: Secondary | ICD-10-CM | POA: Diagnosis not present

## 2014-11-09 DIAGNOSIS — L570 Actinic keratosis: Secondary | ICD-10-CM | POA: Diagnosis not present

## 2014-11-09 DIAGNOSIS — Z85828 Personal history of other malignant neoplasm of skin: Secondary | ICD-10-CM | POA: Diagnosis not present

## 2014-12-03 DIAGNOSIS — H00012 Hordeolum externum right lower eyelid: Secondary | ICD-10-CM | POA: Diagnosis not present

## 2014-12-04 ENCOUNTER — Other Ambulatory Visit: Payer: Self-pay | Admitting: Internal Medicine

## 2014-12-06 NOTE — Telephone Encounter (Signed)
Ok to Rf? Looks like it was d/c.

## 2014-12-10 DIAGNOSIS — H01001 Unspecified blepharitis right upper eyelid: Secondary | ICD-10-CM | POA: Diagnosis not present

## 2014-12-10 DIAGNOSIS — H00012 Hordeolum externum right lower eyelid: Secondary | ICD-10-CM | POA: Diagnosis not present

## 2014-12-10 DIAGNOSIS — H00015 Hordeolum externum left lower eyelid: Secondary | ICD-10-CM | POA: Diagnosis not present

## 2014-12-17 ENCOUNTER — Ambulatory Visit (INDEPENDENT_AMBULATORY_CARE_PROVIDER_SITE_OTHER): Payer: Medicare Other | Admitting: Internal Medicine

## 2014-12-17 ENCOUNTER — Encounter: Payer: Self-pay | Admitting: Internal Medicine

## 2014-12-17 ENCOUNTER — Other Ambulatory Visit (INDEPENDENT_AMBULATORY_CARE_PROVIDER_SITE_OTHER): Payer: Medicare Other

## 2014-12-17 VITALS — BP 120/62 | HR 72 | Wt 253.0 lb

## 2014-12-17 DIAGNOSIS — I10 Essential (primary) hypertension: Secondary | ICD-10-CM | POA: Diagnosis not present

## 2014-12-17 DIAGNOSIS — R609 Edema, unspecified: Secondary | ICD-10-CM

## 2014-12-17 DIAGNOSIS — E118 Type 2 diabetes mellitus with unspecified complications: Secondary | ICD-10-CM | POA: Diagnosis not present

## 2014-12-17 LAB — BASIC METABOLIC PANEL
BUN: 23 mg/dL (ref 6–23)
CO2: 27 meq/L (ref 19–32)
Calcium: 9.9 mg/dL (ref 8.4–10.5)
Chloride: 103 mEq/L (ref 96–112)
Creatinine, Ser: 1.21 mg/dL (ref 0.40–1.50)
GFR: 62.46 mL/min (ref >60.00)
GLUCOSE: 166 mg/dL — AB (ref 70–99)
Potassium: 4.1 mEq/L (ref 3.5–5.1)
SODIUM: 140 meq/L (ref 135–145)

## 2014-12-17 LAB — TSH: TSH: 2.12 u[IU]/mL (ref 0.35–4.50)

## 2014-12-17 LAB — HEMOGLOBIN A1C: HEMOGLOBIN A1C: 7.7 % — AB (ref 4.6–6.5)

## 2014-12-17 MED ORDER — AMLODIPINE BESYLATE 5 MG PO TABS: 2.5000 mg | ORAL_TABLET | Freq: Every day | ORAL | Status: DC

## 2014-12-17 NOTE — Assessment & Plan Note (Addendum)
Reduce Norvasc to 2.5 mg a day. Stop in 2 weeks if swelling is not better Keep legs /feet elevated Compr socks Doppler US B

## 2014-12-17 NOTE — Assessment & Plan Note (Signed)
On Metformin, Glimepiride  

## 2014-12-17 NOTE — Progress Notes (Signed)
Subjective:    HPI   The patient needs to address  chronic hypertension that has been well controlled with medicines; to address chronic  hyperlipidemia controlled with medicines as well; and to address type 2 chronic diabetes, controlled with medical treatment and diet. F/u slight tiredness - poss depressed. F/u leg swelling - now is worse: L>R [name] went to [place] - summer 2015  Review of Systems  Constitutional: Negative for appetite change, fatigue and unexpected weight change.  HENT: Negative for congestion, nosebleeds, sneezing, sore throat and trouble swallowing.   Eyes: Negative for itching and visual disturbance.  Respiratory: Negative for cough.   Cardiovascular: Negative for chest pain, palpitations and leg swelling.  Gastrointestinal: Negative for nausea, diarrhea, blood in stool and abdominal distention.  Genitourinary: Negative for frequency and hematuria.  Musculoskeletal: Negative for back pain, joint swelling, gait problem and neck pain.  Skin: Negative for rash.  Neurological: Negative for dizziness, tremors, speech difficulty and weakness.  Psychiatric/Behavioral: Negative for sleep disturbance, dysphoric mood and agitation. The patient is not nervous/anxious.    Wt Readings from Last 3 Encounters:  12/17/14 253 lb (114.76 kg)  04/07/14 250 lb (113.399 kg)  09/01/13 261 lb (118.389 kg)   BP Readings from Last 3 Encounters:  12/17/14 120/62  04/27/14 128/58  04/07/14 140/68        Objective:   Physical Exam  Constitutional: He is oriented to person, place, and time. He appears well-developed and well-nourished. No distress.  HENT:  Head: Normocephalic and atraumatic.  Right Ear: External ear normal.  Left Ear: External ear normal.  Nose: Nose normal.  Mouth/Throat: Oropharynx is clear and moist. No oropharyngeal exudate.  Eyes: Conjunctivae and EOM are normal. Pupils are equal, round, and reactive to light. Right eye exhibits no discharge. Left eye  exhibits no discharge. No scleral icterus.  Neck: Normal range of motion. Neck supple. No JVD present. No tracheal deviation present. No thyromegaly present.  Cardiovascular: Normal rate, regular rhythm, normal heart sounds and intact distal pulses.  Exam reveals no gallop and no friction rub.   No murmur heard. Pulmonary/Chest: Effort normal and breath sounds normal. No stridor. No respiratory distress. He has no wheezes. He has no rales. He exhibits no tenderness.  Abdominal: Soft. Bowel sounds are normal. He exhibits no distension and no mass. There is no tenderness. There is no rebound and no guarding.  Genitourinary: Rectum normal and penis normal.  Musculoskeletal: Normal range of motion. He exhibits edema. He exhibits no tenderness.  1+ B ankle  Lymphadenopathy:    He has no cervical adenopathy.  Neurological: He is alert and oriented to person, place, and time. He has normal reflexes. No cranial nerve deficit. He exhibits normal muscle tone. Coordination normal.  Skin: Skin is warm and dry. No rash noted. He is not diaphoretic. No erythema. No pallor.  Psychiatric: He has a normal mood and affect. His behavior is normal. Judgment and thought content normal.  Trace on R; 1 or 2+ L ankle swelling. Calves NT Swollen nasal mucosa Bruises on hands   Lab Results  Component Value Date   WBC 10.3 07/13/2011   HGB 14.2 07/13/2011   HCT 42.9 07/13/2011   PLT 286.0 07/13/2011   GLUCOSE 133* 04/07/2014   CHOL 128 04/07/2014   TRIG 135.0 04/07/2014   HDL 38.30* 04/07/2014   LDLCALC 63 04/07/2014   ALT 48 04/23/2012   AST 42* 04/23/2012   NA 139 04/07/2014   K 4.0 04/07/2014  CL 102 04/07/2014   CREATININE 1.1 04/07/2014   BUN 19 04/07/2014   CO2 31 04/07/2014   TSH 1.72 04/29/2013   PSA 0.00 Repeated and verified X2.* 12/26/2012   HGBA1C 7.5* 04/07/2014             Assessment & Plan:

## 2014-12-17 NOTE — Patient Instructions (Addendum)
Reduce Norvasc to 2.5 mg a day. Stop in 2 weeks if swelling is not better Keep legs /feet elevated. Compression knee highs

## 2014-12-17 NOTE — Progress Notes (Signed)
Pre visit review using our clinic review tool, if applicable. No additional management support is needed unless otherwise documented below in the visit note. 

## 2014-12-17 NOTE — Assessment & Plan Note (Signed)
Change amlodipine to a lower dose

## 2014-12-19 ENCOUNTER — Other Ambulatory Visit: Payer: Self-pay | Admitting: Internal Medicine

## 2014-12-19 MED ORDER — GLIMEPIRIDE 1 MG PO TABS: ORAL_TABLET | ORAL | Status: DC

## 2014-12-20 DIAGNOSIS — H01001 Unspecified blepharitis right upper eyelid: Secondary | ICD-10-CM | POA: Diagnosis not present

## 2014-12-20 DIAGNOSIS — H00015 Hordeolum externum left lower eyelid: Secondary | ICD-10-CM | POA: Diagnosis not present

## 2014-12-20 DIAGNOSIS — H00012 Hordeolum externum right lower eyelid: Secondary | ICD-10-CM | POA: Diagnosis not present

## 2015-01-04 ENCOUNTER — Ambulatory Visit (HOSPITAL_COMMUNITY): Payer: Medicare Other | Attending: Cardiovascular Disease

## 2015-01-04 DIAGNOSIS — R609 Edema, unspecified: Secondary | ICD-10-CM | POA: Insufficient documentation

## 2015-01-05 DIAGNOSIS — H01001 Unspecified blepharitis right upper eyelid: Secondary | ICD-10-CM | POA: Diagnosis not present

## 2015-01-05 DIAGNOSIS — L089 Local infection of the skin and subcutaneous tissue, unspecified: Secondary | ICD-10-CM | POA: Diagnosis not present

## 2015-01-05 DIAGNOSIS — H0015 Chalazion left lower eyelid: Secondary | ICD-10-CM | POA: Diagnosis not present

## 2015-01-05 DIAGNOSIS — H00015 Hordeolum externum left lower eyelid: Secondary | ICD-10-CM | POA: Diagnosis not present

## 2015-01-13 DIAGNOSIS — H3531 Nonexudative age-related macular degeneration: Secondary | ICD-10-CM | POA: Diagnosis not present

## 2015-01-13 DIAGNOSIS — I7090 Unspecified atherosclerosis: Secondary | ICD-10-CM | POA: Diagnosis not present

## 2015-01-13 DIAGNOSIS — H01001 Unspecified blepharitis right upper eyelid: Secondary | ICD-10-CM | POA: Diagnosis not present

## 2015-01-13 DIAGNOSIS — H35372 Puckering of macula, left eye: Secondary | ICD-10-CM | POA: Diagnosis not present

## 2015-01-13 LAB — HM DIABETES EYE EXAM

## 2015-01-26 ENCOUNTER — Encounter: Payer: Self-pay | Admitting: Internal Medicine

## 2015-01-27 DIAGNOSIS — C61 Malignant neoplasm of prostate: Secondary | ICD-10-CM | POA: Diagnosis not present

## 2015-01-28 ENCOUNTER — Ambulatory Visit: Payer: Medicare Other | Admitting: Internal Medicine

## 2015-02-01 DIAGNOSIS — Z8546 Personal history of malignant neoplasm of prostate: Secondary | ICD-10-CM | POA: Diagnosis not present

## 2015-02-01 DIAGNOSIS — N393 Stress incontinence (female) (male): Secondary | ICD-10-CM | POA: Diagnosis not present

## 2015-02-01 DIAGNOSIS — N5201 Erectile dysfunction due to arterial insufficiency: Secondary | ICD-10-CM | POA: Diagnosis not present

## 2015-02-22 DIAGNOSIS — H01001 Unspecified blepharitis right upper eyelid: Secondary | ICD-10-CM | POA: Diagnosis not present

## 2015-02-22 DIAGNOSIS — H01003 Unspecified blepharitis right eye, unspecified eyelid: Secondary | ICD-10-CM | POA: Diagnosis not present

## 2015-04-04 DIAGNOSIS — H01003 Unspecified blepharitis right eye, unspecified eyelid: Secondary | ICD-10-CM | POA: Diagnosis not present

## 2015-04-04 DIAGNOSIS — H01001 Unspecified blepharitis right upper eyelid: Secondary | ICD-10-CM | POA: Diagnosis not present

## 2015-04-04 DIAGNOSIS — H10413 Chronic giant papillary conjunctivitis, bilateral: Secondary | ICD-10-CM | POA: Diagnosis not present

## 2015-04-05 ENCOUNTER — Other Ambulatory Visit: Payer: Self-pay | Admitting: Internal Medicine

## 2015-04-07 ENCOUNTER — Encounter: Payer: Self-pay | Admitting: Gastroenterology

## 2015-04-16 ENCOUNTER — Other Ambulatory Visit: Payer: Self-pay | Admitting: Internal Medicine

## 2015-04-26 DIAGNOSIS — H01003 Unspecified blepharitis right eye, unspecified eyelid: Secondary | ICD-10-CM | POA: Diagnosis not present

## 2015-04-26 DIAGNOSIS — H00015 Hordeolum externum left lower eyelid: Secondary | ICD-10-CM | POA: Diagnosis not present

## 2015-04-26 DIAGNOSIS — H01001 Unspecified blepharitis right upper eyelid: Secondary | ICD-10-CM | POA: Diagnosis not present

## 2015-04-26 DIAGNOSIS — H02839 Dermatochalasis of unspecified eye, unspecified eyelid: Secondary | ICD-10-CM | POA: Diagnosis not present

## 2015-05-05 DIAGNOSIS — H00015 Hordeolum externum left lower eyelid: Secondary | ICD-10-CM | POA: Diagnosis not present

## 2015-05-05 DIAGNOSIS — H01001 Unspecified blepharitis right upper eyelid: Secondary | ICD-10-CM | POA: Diagnosis not present

## 2015-05-10 DIAGNOSIS — L570 Actinic keratosis: Secondary | ICD-10-CM | POA: Diagnosis not present

## 2015-05-10 DIAGNOSIS — Z08 Encounter for follow-up examination after completed treatment for malignant neoplasm: Secondary | ICD-10-CM | POA: Diagnosis not present

## 2015-05-10 DIAGNOSIS — Z85828 Personal history of other malignant neoplasm of skin: Secondary | ICD-10-CM | POA: Diagnosis not present

## 2015-06-01 DIAGNOSIS — H10413 Chronic giant papillary conjunctivitis, bilateral: Secondary | ICD-10-CM | POA: Diagnosis not present

## 2015-06-01 DIAGNOSIS — H40013 Open angle with borderline findings, low risk, bilateral: Secondary | ICD-10-CM | POA: Diagnosis not present

## 2015-06-01 DIAGNOSIS — H01001 Unspecified blepharitis right upper eyelid: Secondary | ICD-10-CM | POA: Diagnosis not present

## 2015-06-01 DIAGNOSIS — H01003 Unspecified blepharitis right eye, unspecified eyelid: Secondary | ICD-10-CM | POA: Diagnosis not present

## 2015-06-24 ENCOUNTER — Other Ambulatory Visit: Payer: Self-pay | Admitting: Internal Medicine

## 2015-08-18 DIAGNOSIS — J029 Acute pharyngitis, unspecified: Secondary | ICD-10-CM | POA: Diagnosis not present

## 2015-08-23 ENCOUNTER — Ambulatory Visit (INDEPENDENT_AMBULATORY_CARE_PROVIDER_SITE_OTHER): Payer: Medicare Other | Admitting: Internal Medicine

## 2015-08-23 ENCOUNTER — Encounter: Payer: Self-pay | Admitting: Internal Medicine

## 2015-08-23 VITALS — BP 144/70 | HR 96 | Temp 98.2°F | Ht 72.0 in | Wt 237.0 lb

## 2015-08-23 DIAGNOSIS — R062 Wheezing: Secondary | ICD-10-CM | POA: Diagnosis not present

## 2015-08-23 DIAGNOSIS — R05 Cough: Secondary | ICD-10-CM | POA: Diagnosis not present

## 2015-08-23 DIAGNOSIS — E118 Type 2 diabetes mellitus with unspecified complications: Secondary | ICD-10-CM | POA: Diagnosis not present

## 2015-08-23 DIAGNOSIS — R059 Cough, unspecified: Secondary | ICD-10-CM

## 2015-08-23 MED ORDER — PREDNISONE 10 MG PO TABS: ORAL_TABLET | ORAL | Status: DC

## 2015-08-23 MED ORDER — ALBUTEROL SULFATE HFA 108 (90 BASE) MCG/ACT IN AERS: 2.0000 | INHALATION_SPRAY | Freq: Four times a day (QID) | RESPIRATORY_TRACT | Status: DC | PRN

## 2015-08-23 MED ORDER — CEFTRIAXONE SODIUM 1 G IJ SOLR
1.0000 g | Freq: Once | INTRAMUSCULAR | Status: AC
  Administered 2015-08-23: 1 g via INTRAMUSCULAR

## 2015-08-23 MED ORDER — HYDROCODONE-HOMATROPINE 5-1.5 MG/5ML PO SYRP: 5.0000 mL | ORAL_SOLUTION | Freq: Four times a day (QID) | ORAL | Status: DC | PRN

## 2015-08-23 MED ORDER — LEVOFLOXACIN 250 MG PO TABS: 250.0000 mg | ORAL_TABLET | Freq: Every day | ORAL | Status: DC

## 2015-08-23 MED ORDER — METHYLPREDNISOLONE ACETATE 80 MG/ML IJ SUSP
80.0000 mg | Freq: Once | INTRAMUSCULAR | Status: AC
  Administered 2015-08-23: 80 mg via INTRAMUSCULAR

## 2015-08-23 NOTE — Assessment & Plan Note (Addendum)
Stable, but ok to hold the glimeparide until feeling better since not taking po well, pt to call for onset polys or cbg > 200

## 2015-08-23 NOTE — Progress Notes (Signed)
Pre visit review using our clinic review tool, if applicable. No additional management support is needed unless otherwise documented below in the visit note. 

## 2015-08-23 NOTE — Progress Notes (Signed)
Subjective:    Patient ID: Larry [name], male    DOB: [birth year], 75 y.o.   MRN: [id number]  HPI  Here with acute onset mild to mod 2-3 days ST, HA, general weakness and malaise, with prod cough greenish sputum, but Pt denies chest pain, increased sob or doe, wheezing, orthopnea, PND, increased LE swelling, palpitations, dizziness or syncope, except for onset sob and wheezing x 2 days.  Not apprec better after zpack and mucinex per urgent care. Pt denies new neurological symptoms such as new headache, or facial or extremity weakness or numbness   Pt denies polydipsia, polyuria  Past Medical History  Diagnosis Date  . Hyperlipemia   . Type II or unspecified type diabetes mellitus without mention of complication, not stated as uncontrolled   . Unspecified essential hypertension   . Prostate CA [facility])    Past Surgical History  Procedure Laterality Date  . Prostatectomy    . Appendectomy      reports that he has never smoked. He does not have any smokeless tobacco history on file. He reports that he does not drink alcohol or use illicit drugs. family history includes Heart disease in his mother. Allergies  Allergen Reactions  . Clindamycin Hcl   . Verapamil     Edema w/high dose   Current Outpatient Prescriptions on File Prior to Visit  Medication Sig Dispense Refill  . amLODipine (NORVASC) 5 MG tablet Take 0.5 tablets (2.5 mg total) by mouth daily. 90 tablet 1  . aspirin 81 MG tablet Take 81 mg by mouth daily.      . benazepril-hydrochlorthiazide (LOTENSIN HCT) 20-25 MG per tablet TAKE ONE TABLET BY MOUTH ONCE DAILY 90 tablet 0  . Cholecalciferol (VITAMIN D3) 1000 UNITS CAPS Take by mouth daily.     . fluticasone (FLONASE) 50 MCG/ACT nasal spray Place 2 sprays into both nostrils daily. 42 g 3  . Ginkgo Biloba 40 MG TABS Take by mouth daily.    [name] glimepiride (AMARYL) 1 MG tablet TAKE ONE TABLET BY MOUTH TWICE DAILY 180 tablet 2  . glucose blood test strip 1 each by Other route 2  (two) times daily. Please dispense brand of choice per pt and insurance preference. Dx:250.00 100 each 5  . glucose monitoring kit (FREESTYLE) monitoring kit 1 each by Does not apply route as needed for other. Please dispense brand of choice per ins/patient. Dx: 250.00. 1 each 0  . Lancets MISC 1 each by Does not apply route 2 (two) times daily. 100 each 5  . metFORMIN (GLUCOPHAGE) 1000 MG tablet TAKE ONE TABLET BY MOUTH TWICE DAILY WITH MEALS. 180 tablet 1  . Multiple Vitamins-Minerals (ICAPS) CAPS Take 1 capsule by mouth daily.      . Omega-3 Fatty Acids (OMEGA 3 PO) Take by mouth daily.    . simvastatin (ZOCOR) 40 MG tablet Take 1 tablet (40 mg total) by mouth at bedtime. 90 tablet 3  . simvastatin (ZOCOR) 40 MG tablet TAKE ONE TABLET BY MOUTH AT BEDTIME 90 tablet 2  . vitamin C (ASCORBIC ACID) 500 MG tablet Take 1 tablet (500 mg total) by mouth daily. 100 tablet 3   Current Facility-Administered Medications on File Prior to Visit  Medication Dose Route Frequency Provider Last Rate Last Dose  . pneumococcal 23 valent vaccine (PNU-IMMUNE) injection 0.5 mL  0.5 mL Intramuscular Once Aleksei Plotnikov V, MD       Review of Systems  Constitutional: Negative for unusual diaphoresis or night sweats HENT:  Negative for ringing in ear or discharge Eyes: Negative for double vision or worsening visual disturbance.  Respiratory: Negative for choking and stridor.   Gastrointestinal: Negative for vomiting or other signifcant bowel change Genitourinary: Negative for hematuria or change in urine volume.  Musculoskeletal: Negative for other MSK pain or swelling Skin: Negative for color change and worsening wound.  Neurological: Negative for tremors and numbness other than noted  Psychiatric/Behavioral: Negative for decreased concentration or agitation other than above       Objective:   Physical Exam BP 144/70 mmHg  Pulse 96  Temp(Src) 98.2 F (36.8 C) (Oral)  Ht 6' (1.829 m)  Wt 237 lb (107.502  kg)  BMI 32.14 kg/m2  SpO2 95% VS noted,  Constitutional: Pt appears in no significant distress HENT: Head: NCAT.  Right Ear: External ear normal.  Left Ear: External ear normal.  Eyes: . Pupils are equal, round, and reactive to light. Conjunctivae and EOM are normal Neck: Normal range of motion. Neck supple.  Cardiovascular: Normal rate and regular rhythm.   Pulmonary/Chest: Effort normal , no accessory muscle use, but  L basilar rales, bilat rhonchi and wheezes Neurological: Pt is alert. Not confused , motor grossly intact Skin: Skin is warm. No rash, no LE edema Psychiatric: Pt behavior is normal. No agitation.     Assessment & Plan:

## 2015-08-23 NOTE — Patient Instructions (Signed)
You had the steroid shot today, and the antibiotic shot today  Please take all new medication as prescribed - the antibiotic, cough medicine, and prednisone  Please continue all other medications as before, except you can hold the glimeparide (amaryl) until you are eating better, to avoid lower sugars  Please have the pharmacy call with any other refills you may need.  Please continue your efforts at being more active, low cholesterol diet, and weight control.  Please keep your appointments with your specialists as you may have planned  Please go to the XRAY Department in the Basement (go straight as you get off the elevator) for the x-ray testing Tomorrow  You will be contacted by phone if any changes need to be made immediately.  Otherwise, you will receive a letter about your results with an explanation, but please check with MyChart first.  Please remember to sign up for MyChart if you have not done so, as this will be important to you in the future with finding out test results, communicating by private email, and scheduling acute appointments online when needed.

## 2015-08-24 ENCOUNTER — Encounter: Payer: Self-pay | Admitting: Internal Medicine

## 2015-08-24 ENCOUNTER — Ambulatory Visit (INDEPENDENT_AMBULATORY_CARE_PROVIDER_SITE_OTHER)
Admission: RE | Admit: 2015-08-24 | Discharge: 2015-08-24 | Disposition: A | Discharge status: Home / Self Care | Payer: Medicare Other | Source: Ambulatory Visit | Attending: Internal Medicine | Admitting: Internal Medicine

## 2015-08-24 DIAGNOSIS — R05 Cough: Secondary | ICD-10-CM | POA: Diagnosis not present

## 2015-08-24 DIAGNOSIS — R059 Cough, unspecified: Secondary | ICD-10-CM

## 2015-08-24 DIAGNOSIS — R0602 Shortness of breath: Secondary | ICD-10-CM | POA: Diagnosis not present

## 2015-08-24 DIAGNOSIS — R509 Fever, unspecified: Secondary | ICD-10-CM | POA: Diagnosis not present

## 2015-08-24 NOTE — Assessment & Plan Note (Signed)
Mild to mod, c/w bronchitis vs pna, for cxr in am, also rocephin IM now, po antibx, cough med prn,  to f/u any worsening symptoms or concerns

## 2015-08-24 NOTE — Assessment & Plan Note (Signed)
Mild to mod, for depomedrol IM, predpac asd, to f/u any worsening symptoms or concerns 

## 2015-08-25 ENCOUNTER — Telehealth: Payer: Self-pay | Admitting: Internal Medicine

## 2015-08-25 NOTE — Telephone Encounter (Signed)
Pt wife called in and would like results of the xray that pt had yesterday

## 2015-08-25 NOTE — Telephone Encounter (Signed)
Result Notes     Notes Recorded by Lyman Bishop, CMA on 08/25/2015 at 10:26 AM Pt advised and understood. Transferred to sch with AVP ------  Notes Recorded by Biagio Borg, MD on 08/24/2015 at 12:42 PM Left message on MyChart, pt to cont same tx, excpet  The test results show that your current treatment is OK, except the xray does show evidence for possibly even pneumonia of both lungs. This is higher risk and some persons are placed in hospital for IV antibiotics if not improving otherwise..  There is no need for change of treatment or further evaluation based on these results at this time. Please otherwise continue the same plan for further evaluation and treatment as discussed, except we should recommend that you come to see Dr Alain Marion in follow up in the next 1-2 days. You should hear from the office about this as well.   Dahlia to inform pt, pt needs F/u with Dr Alain Marion in 1-2 days

## 2015-08-26 ENCOUNTER — Ambulatory Visit (INDEPENDENT_AMBULATORY_CARE_PROVIDER_SITE_OTHER): Payer: Medicare Other | Admitting: Internal Medicine

## 2015-08-26 ENCOUNTER — Encounter: Payer: Self-pay | Admitting: Internal Medicine

## 2015-08-26 VITALS — BP 120/60 | HR 89 | Temp 97.8°F | Wt 236.0 lb

## 2015-08-26 DIAGNOSIS — R062 Wheezing: Secondary | ICD-10-CM | POA: Diagnosis not present

## 2015-08-26 DIAGNOSIS — J189 Pneumonia, unspecified organism: Secondary | ICD-10-CM | POA: Diagnosis not present

## 2015-08-26 DIAGNOSIS — I10 Essential (primary) hypertension: Secondary | ICD-10-CM | POA: Diagnosis not present

## 2015-08-26 NOTE — Progress Notes (Signed)
Subjective:  Patient ID: Larry [name], male    DOB: [birth year]  Age: 75 y.o. MRN: [id number]  CC: No chief complaint on file.   HPI Larry [name] presents for CAP f/u. Pt has been sick x 3 weeks.On Levaquin. Took Z pac prior.Feeling better a little  Outpatient Prescriptions Prior to Visit  Medication Sig Dispense Refill  . albuterol (PROVENTIL HFA;VENTOLIN HFA) 108 (90 Base) MCG/ACT inhaler Inhale 2 puffs into the lungs every 6 (six) hours as needed for wheezing or shortness of breath. 1 Inhaler 1  . amLODipine (NORVASC) 5 MG tablet Take 0.5 tablets (2.5 mg total) by mouth daily. 90 tablet 1  . aspirin 81 MG tablet Take 81 mg by mouth daily.      . benazepril-hydrochlorthiazide (LOTENSIN HCT) 20-25 MG per tablet TAKE ONE TABLET BY MOUTH ONCE DAILY 90 tablet 0  . Cholecalciferol (VITAMIN D3) 1000 UNITS CAPS Take by mouth daily.     . fluticasone (FLONASE) 50 MCG/ACT nasal spray Place 2 sprays into both nostrils daily. 42 g 3  . Ginkgo Biloba 40 MG TABS Take by mouth daily.    [name] glimepiride (AMARYL) 1 MG tablet TAKE ONE TABLET BY MOUTH TWICE DAILY 180 tablet 2  . glucose blood test strip 1 each by Other route 2 (two) times daily. Please dispense brand of choice per pt and insurance preference. Dx:250.00 100 each 5  . glucose monitoring kit (FREESTYLE) monitoring kit 1 each by Does not apply route as needed for other. Please dispense brand of choice per ins/patient. Dx: 250.00. 1 each 0  . HYDROcodone-homatropine (HYCODAN) 5-1.5 MG/5ML syrup Take 5 mLs by mouth every 6 (six) hours as needed for cough. 180 mL 0  . Lancets MISC 1 each by Does not apply route 2 (two) times daily. 100 each 5  . levofloxacin (LEVAQUIN) 250 MG tablet Take 1 tablet (250 mg total) by mouth daily. 10 tablet 0  . metFORMIN (GLUCOPHAGE) 1000 MG tablet TAKE ONE TABLET BY MOUTH TWICE DAILY WITH MEALS. 180 tablet 1  . Multiple Vitamins-Minerals (ICAPS) CAPS Take 1 capsule by mouth daily.      . Omega-3 Fatty Acids  (OMEGA 3 PO) Take by mouth daily.    . simvastatin (ZOCOR) 40 MG tablet TAKE ONE TABLET BY MOUTH AT BEDTIME 90 tablet 2  . vitamin C (ASCORBIC ACID) 500 MG tablet Take 1 tablet (500 mg total) by mouth daily. 100 tablet 3  . predniSONE (DELTASONE) 10 MG tablet 3 tabs by mouth per day for 3 days,2tabs per day for 3 days,1tab per day for 3 days 18 tablet 0  . simvastatin (ZOCOR) 40 MG tablet Take 1 tablet (40 mg total) by mouth at bedtime. 90 tablet 3   Facility-Administered Medications Prior to Visit  Medication Dose Route Frequency Provider Last Rate Last Dose  . pneumococcal 23 valent vaccine (PNU-IMMUNE) injection 0.5 mL  0.5 mL Intramuscular Once Aleksei Plotnikov V, MD        ROS Review of Systems  Constitutional: Positive for fever, chills, fatigue and unexpected weight change. Negative for appetite change.  HENT: Negative for congestion, nosebleeds, sneezing, sore throat and trouble swallowing.   Eyes: Negative for itching and visual disturbance.  Respiratory: Positive for cough, chest tightness and shortness of breath.   Cardiovascular: Negative for chest pain, palpitations and leg swelling.  Gastrointestinal: Negative for nausea, diarrhea, blood in stool and abdominal distention.  Genitourinary: Negative for frequency and hematuria.  Musculoskeletal: Negative for back pain, joint  swelling, gait problem and neck pain.  Skin: Negative for rash.  Neurological: Negative for dizziness, tremors, speech difficulty and weakness.  Psychiatric/Behavioral: Negative for sleep disturbance, dysphoric mood and agitation. The patient is not nervous/anxious.     Objective:  BP 120/60 mmHg  Pulse 89  Temp(Src) 97.8 F (36.6 C) (Oral)  Wt 236 lb (107.049 kg)  SpO2 90%  BP Readings from Last 3 Encounters:  08/26/15 120/60  08/23/15 144/70  12/17/14 120/62    Wt Readings from Last 3 Encounters:  08/26/15 236 lb (107.049 kg)  08/23/15 237 lb (107.502 kg)  12/17/14 253 lb (114.76 kg)     Physical Exam  Constitutional: He is oriented to person, place, and time. He appears well-developed. No distress.  NAD  HENT:  Mouth/Throat: Oropharynx is clear and moist.  Eyes: Conjunctivae are normal. Pupils are equal, round, and reactive to light.  Neck: Normal range of motion. No JVD present. No thyromegaly present.  Cardiovascular: Normal rate, regular rhythm, normal heart sounds and intact distal pulses.  Exam reveals no gallop and no friction rub.   No murmur heard. Pulmonary/Chest: Effort normal. No respiratory distress. He has no wheezes. He has rales. He exhibits no tenderness.  Abdominal: Soft. Bowel sounds are normal. He exhibits no distension and no mass. There is no tenderness. There is no rebound and no guarding.  Musculoskeletal: Normal range of motion. He exhibits no edema or tenderness.  Lymphadenopathy:    He has no cervical adenopathy.  Neurological: He is alert and oriented to person, place, and time. He has normal reflexes. No cranial nerve deficit. He exhibits normal muscle tone. He displays a negative Romberg sign. Coordination and gait normal.  Skin: Skin is warm and dry. No rash noted.  Psychiatric: He has a normal mood and affect. His behavior is normal. Judgment and thought content normal.  Looks tired B rales  Lab Results  Component Value Date   WBC 10.3 07/13/2011   HGB 14.2 07/13/2011   HCT 42.9 07/13/2011   PLT 286.0 07/13/2011   GLUCOSE 166* 12/17/2014   CHOL 128 04/07/2014   TRIG 135.0 04/07/2014   HDL 38.30* 04/07/2014   LDLCALC 63 04/07/2014   ALT 48 04/23/2012   AST 42* 04/23/2012   NA 140 12/17/2014   K 4.1 12/17/2014   CL 103 12/17/2014   CREATININE 1.21 12/17/2014   BUN 23 12/17/2014   CO2 27 12/17/2014   TSH 2.12 12/17/2014   PSA 0.00 Repeated and verified X2.* 12/26/2012   HGBA1C 7.7* 12/17/2014    Dg Chest 2 View  08/24/2015  CLINICAL DATA:  Fever cough chest congestion and shortness of breath for the past 10 days; patient  has history of diabetes and sleep apnea, nonsmoker. EXAM: CHEST  2 VIEW COMPARISON:  Report of a apical lordotic view of the chest of February 2001. FINDINGS: The lungs are adequately inflated. The interstitial markings are coarse at both lung bases. There is no alveolar infiltrate. There is no pleural effusion. The heart and pulmonary vascularity are normal. The mediastinum is normal in width. The trachea is midline. The bony thorax exhibits no acute abnormality. IMPRESSION: Bibasilar subsegmental atelectasis or early interstitial pneumonia. Followup PA and lateral chest X-ray is recommended in 3-4 weeks following trial of antibiotic therapy to ensure resolution and exclude underlying malignancy. Electronically Signed   By: David  Martinique M.D.   On: 08/24/2015 12:15    Assessment & Plan:   There are no diagnoses linked to this encounter. I  have discontinued Mr. Calderwood's predniSONE. I am also having him maintain his aspirin, ICAPS, Vitamin D3, Ginkgo Biloba, Omega-3 Fatty Acids (OMEGA 3 PO), glucose blood, Lancets, glucose monitoring kit, vitamin C, fluticasone, amLODipine, benazepril-hydrochlorthiazide, metFORMIN, simvastatin, glimepiride, levofloxacin, HYDROcodone-homatropine, and albuterol. We will continue to administer pneumococcal 23 valent vaccine.  No orders of the defined types were placed in this encounter.     Follow-up: Return in about 6 weeks (around 10/07/2015) for a follow-up visit.  Alex Plotnikov, MD  

## 2015-08-26 NOTE — Progress Notes (Signed)
Pre visit review using our clinic review tool, if applicable. No additional management support is needed unless otherwise documented below in the visit note. 

## 2015-08-26 NOTE — Assessment & Plan Note (Signed)
Cough syrup Albuterol MDI prn

## 2015-08-26 NOTE — Assessment & Plan Note (Signed)
Hold Benazepril HCT x 2 d

## 2015-08-26 NOTE — Assessment & Plan Note (Signed)
Finish Levaquin To ER if worse RTC 6 wks

## 2015-08-26 NOTE — Assessment & Plan Note (Signed)
Hold metformin x 2 d Check CBGs

## 2015-08-31 ENCOUNTER — Telehealth: Payer: Self-pay | Admitting: Internal Medicine

## 2015-08-31 NOTE — Telephone Encounter (Signed)
Pt requesting Nano testing strips Pharmacy is [facility] in [place]

## 2015-09-01 MED ORDER — GLUCOSE BLOOD VI STRP: 1.0000 | ORAL_STRIP | Freq: Two times a day (BID) | Status: DC

## 2015-09-01 NOTE — Telephone Encounter (Signed)
Pt left msg on triage stating been trying to get refills on his Accu chek nano strips haven't check BS in 3 days. Notified pt rx has been sent...Larry [name]

## 2015-10-01 ENCOUNTER — Other Ambulatory Visit: Payer: Self-pay | Admitting: Internal Medicine

## 2015-10-05 ENCOUNTER — Other Ambulatory Visit: Payer: Self-pay | Admitting: Internal Medicine

## 2015-10-11 ENCOUNTER — Ambulatory Visit (INDEPENDENT_AMBULATORY_CARE_PROVIDER_SITE_OTHER)
Admission: RE | Admit: 2015-10-11 | Discharge: 2015-10-11 | Disposition: A | Discharge status: Home / Self Care | Payer: Medicare Other | Source: Ambulatory Visit | Attending: Internal Medicine | Admitting: Internal Medicine

## 2015-10-11 ENCOUNTER — Encounter: Payer: Self-pay | Admitting: Internal Medicine

## 2015-10-11 ENCOUNTER — Ambulatory Visit (INDEPENDENT_AMBULATORY_CARE_PROVIDER_SITE_OTHER): Payer: Medicare Other | Admitting: Internal Medicine

## 2015-10-11 ENCOUNTER — Other Ambulatory Visit (INDEPENDENT_AMBULATORY_CARE_PROVIDER_SITE_OTHER): Payer: Medicare Other

## 2015-10-11 VITALS — BP 162/84 | HR 71 | Wt 234.0 lb

## 2015-10-11 DIAGNOSIS — E118 Type 2 diabetes mellitus with unspecified complications: Secondary | ICD-10-CM | POA: Diagnosis not present

## 2015-10-11 DIAGNOSIS — J189 Pneumonia, unspecified organism: Secondary | ICD-10-CM

## 2015-10-11 DIAGNOSIS — I10 Essential (primary) hypertension: Secondary | ICD-10-CM

## 2015-10-11 DIAGNOSIS — E785 Hyperlipidemia, unspecified: Secondary | ICD-10-CM | POA: Diagnosis not present

## 2015-10-11 LAB — BASIC METABOLIC PANEL
BUN: 23 mg/dL (ref 6–23)
CALCIUM: 10 mg/dL (ref 8.4–10.5)
CO2: 31 meq/L (ref 19–32)
CREATININE: 1.22 mg/dL (ref 0.40–1.50)
Chloride: 102 mEq/L (ref 96–112)
GFR: 61.73 mL/min (ref >60.00)
Glucose, Bld: 180 mg/dL — ABNORMAL HIGH (ref 70–99)
Potassium: 3.8 mEq/L (ref 3.5–5.1)
Sodium: 141 mEq/L (ref 135–145)

## 2015-10-11 LAB — HEMOGLOBIN A1C: HEMOGLOBIN A1C: 8.3 % — AB (ref 4.6–6.5)

## 2015-10-11 MED ORDER — BENAZEPRIL HCL 40 MG PO TABS: 40.0000 mg | ORAL_TABLET | Freq: Every day | ORAL | Status: DC

## 2015-10-11 MED ORDER — GLIMEPIRIDE 2 MG PO TABS: 2.0000 mg | ORAL_TABLET | Freq: Every day | ORAL | Status: DC

## 2015-10-11 MED ORDER — GLUCOSE BLOOD VI STRP: 1.0000 | ORAL_STRIP | Freq: Two times a day (BID) | Status: DC

## 2015-10-11 MED ORDER — FLUTICASONE PROPIONATE 50 MCG/ACT NA SUSP: 2.0000 | Freq: Every day | NASAL | Status: DC

## 2015-10-11 MED ORDER — SIMVASTATIN 40 MG PO TABS: 40.0000 mg | ORAL_TABLET | Freq: Every day | ORAL | Status: DC

## 2015-10-11 MED ORDER — LANCETS MISC: 1.0000 | Freq: Two times a day (BID) | Status: DC

## 2015-10-11 MED ORDER — TRIAMTERENE-HCTZ 37.5-25 MG PO TABS: 1.0000 | ORAL_TABLET | Freq: Every day | ORAL | Status: DC

## 2015-10-11 MED ORDER — METFORMIN HCL 1000 MG PO TABS: 1000.0000 mg | ORAL_TABLET | Freq: Two times a day (BID) | ORAL | Status: DC

## 2015-10-11 NOTE — Assessment & Plan Note (Signed)
Chronic  Simvastatin 

## 2015-10-11 NOTE — Assessment & Plan Note (Signed)
Increase Glimepiride

## 2015-10-11 NOTE — Assessment & Plan Note (Signed)
D/c norvasc - swelling Lotensin, Triamt/HCT

## 2015-10-11 NOTE — Progress Notes (Signed)
Pre visit review using our clinic review tool, if applicable. No additional management support is needed unless otherwise documented below in the visit note. 

## 2015-10-11 NOTE — Progress Notes (Signed)
Subjective:  Patient ID: Larry [name], male    DOB: 06-Oct-1940  Age: 75 y.o. MRN: [id number]  CC: No chief complaint on file.   HPI Larry [name] presents for CAP f/u. Doing well. F/u DM, HTN. CBGs 140-200s. Not taking amlodipine.  C/o being tearful at times; not depressed  Outpatient Prescriptions Prior to Visit  Medication Sig Dispense Refill  . albuterol (PROVENTIL HFA;VENTOLIN HFA) 108 (90 Base) MCG/ACT inhaler Inhale 2 puffs into the lungs every 6 (six) hours as needed for wheezing or shortness of breath. 1 Inhaler 1  . amLODipine (NORVASC) 5 MG tablet Take 0.5 tablets (2.5 mg total) by mouth daily. 90 tablet 1  . aspirin 81 MG tablet Take 81 mg by mouth daily.      . benazepril-hydrochlorthiazide (LOTENSIN HCT) 20-25 MG tablet TAKE ONE TABLET BY MOUTH ONCE DAILY 90 tablet 1  . Cholecalciferol (VITAMIN D3) 1000 UNITS CAPS Take by mouth daily.     . fluticasone (FLONASE) 50 MCG/ACT nasal spray Place 2 sprays into both nostrils daily. 42 g 3  . Ginkgo Biloba 40 MG TABS Take by mouth daily.    [name] glimepiride (AMARYL) 1 MG tablet TAKE ONE TABLET BY MOUTH TWICE DAILY 180 tablet 2  . glucose blood (ACCU-CHEK SMARTVIEW) test strip 1 each by Other route 2 (two) times daily. Use to check blood sugars twice a day Dx E11.9 100 each 3  . glucose blood test strip 1 each by Other route 2 (two) times daily. Please dispense brand of choice per pt and insurance preference. Dx:250.00 100 each 5  . glucose monitoring kit (FREESTYLE) monitoring kit 1 each by Does not apply route as needed for other. Please dispense brand of choice per ins/patient. Dx: 250.00. 1 each 0  . HYDROcodone-homatropine (HYCODAN) 5-1.5 MG/5ML syrup Take 5 mLs by mouth every 6 (six) hours as needed for cough. 180 mL 0  . Lancets MISC 1 each by Does not apply route 2 (two) times daily. 100 each 5  . levofloxacin (LEVAQUIN) 250 MG tablet Take 1 tablet (250 mg total) by mouth daily. 10 tablet 0  . metFORMIN (GLUCOPHAGE) 1000 MG  tablet TAKE ONE TABLET BY MOUTH TWICE DAILY WITH MEALS 180 tablet 0  . Multiple Vitamins-Minerals (ICAPS) CAPS Take 1 capsule by mouth daily.      . Omega-3 Fatty Acids (OMEGA 3 PO) Take by mouth daily.    . simvastatin (ZOCOR) 40 MG tablet TAKE ONE TABLET BY MOUTH AT BEDTIME 90 tablet 2  . vitamin C (ASCORBIC ACID) 500 MG tablet Take 1 tablet (500 mg total) by mouth daily. 100 tablet 3   Facility-Administered Medications Prior to Visit  Medication Dose Route Frequency Provider Last Rate Last Dose  . pneumococcal 23 valent vaccine (PNU-IMMUNE) injection 0.5 mL  0.5 mL Intramuscular Once Aleksei Plotnikov V, MD        ROS Review of Systems  Constitutional: Negative for appetite change, fatigue and unexpected weight change.  HENT: Negative for congestion, nosebleeds, sneezing, sore throat and trouble swallowing.   Eyes: Negative for itching and visual disturbance.  Respiratory: Negative for cough.   Cardiovascular: Negative for chest pain, palpitations and leg swelling.  Gastrointestinal: Negative for nausea, diarrhea, blood in stool and abdominal distention.  Genitourinary: Negative for frequency and hematuria.  Musculoskeletal: Negative for back pain, joint swelling, gait problem and neck pain.  Skin: Negative for rash.  Neurological: Negative for dizziness, tremors, speech difficulty and weakness.  Psychiatric/Behavioral: Negative for suicidal ideas,  sleep disturbance, dysphoric mood and agitation. The patient is not nervous/anxious.     Objective:  BP 162/84 mmHg  Pulse 71  Wt 234 lb (106.142 kg)  SpO2 95%  BP Readings from Last 3 Encounters:  10/11/15 162/84  08/26/15 120/60  08/23/15 144/70    Wt Readings from Last 3 Encounters:  10/11/15 234 lb (106.142 kg)  08/26/15 236 lb (107.049 kg)  08/23/15 237 lb (107.502 kg)    Physical Exam  Constitutional: He is oriented to person, place, and time. He appears well-developed. No distress.  NAD  HENT:  Mouth/Throat:  Oropharynx is clear and moist.  Eyes: Conjunctivae are normal. Pupils are equal, round, and reactive to light.  Neck: Normal range of motion. No JVD present. No thyromegaly present.  Cardiovascular: Normal rate, regular rhythm, normal heart sounds and intact distal pulses.  Exam reveals no gallop and no friction rub.   No murmur heard. Pulmonary/Chest: Effort normal and breath sounds normal. No respiratory distress. He has no wheezes. He has no rales. He exhibits no tenderness.  Abdominal: Soft. Bowel sounds are normal. He exhibits no distension and no mass. There is no tenderness. There is no rebound and no guarding.  Musculoskeletal: Normal range of motion. He exhibits no edema or tenderness.  Lymphadenopathy:    He has no cervical adenopathy.  Neurological: He is alert and oriented to person, place, and time. He has normal reflexes. No cranial nerve deficit. He exhibits normal muscle tone. He displays a negative Romberg sign. Coordination and gait normal.  Skin: Skin is warm and dry. No rash noted.  Psychiatric: He has a normal mood and affect. His behavior is normal. Judgment and thought content normal.    Lab Results  Component Value Date   WBC 10.3 07/13/2011   HGB 14.2 07/13/2011   HCT 42.9 07/13/2011   PLT 286.0 07/13/2011   GLUCOSE 166* 12/17/2014   CHOL 128 04/07/2014   TRIG 135.0 04/07/2014   HDL 38.30* 04/07/2014   LDLCALC 63 04/07/2014   ALT 48 04/23/2012   AST 42* 04/23/2012   NA 140 12/17/2014   K 4.1 12/17/2014   CL 103 12/17/2014   CREATININE 1.21 12/17/2014   BUN 23 12/17/2014   CO2 27 12/17/2014   TSH 2.12 12/17/2014   PSA 0.00 Repeated and verified X2.* 12/26/2012   HGBA1C 7.7* 12/17/2014    Dg Chest 2 View  08/24/2015  CLINICAL DATA:  Fever cough chest congestion and shortness of breath for the past 10 days; patient has history of diabetes and sleep apnea, nonsmoker. EXAM: CHEST  2 VIEW COMPARISON:  Report of a apical lordotic view of the chest of February  2001. FINDINGS: The lungs are adequately inflated. The interstitial markings are coarse at both lung bases. There is no alveolar infiltrate. There is no pleural effusion. The heart and pulmonary vascularity are normal. The mediastinum is normal in width. The trachea is midline. The bony thorax exhibits no acute abnormality. IMPRESSION: Bibasilar subsegmental atelectasis or early interstitial pneumonia. Followup PA and lateral chest X-ray is recommended in 3-4 weeks following trial of antibiotic therapy to ensure resolution and exclude underlying malignancy. Electronically Signed   By: David  Martinique M.D.   On: 08/24/2015 12:15    Assessment & Plan:   There are no diagnoses linked to this encounter. I am having Mr. Ostermiller maintain his aspirin, ICAPS, Vitamin D3, Ginkgo Biloba, Omega-3 Fatty Acids (OMEGA 3 PO), glucose blood, Lancets, glucose monitoring kit, vitamin C, fluticasone, amLODipine, simvastatin, glimepiride,  levofloxacin, HYDROcodone-homatropine, albuterol, glucose blood, benazepril-hydrochlorthiazide, and metFORMIN. We will continue to administer pneumococcal 23 valent vaccine.  No orders of the defined types were placed in this encounter.     Follow-up: No Follow-up on file.  Walker Kehr, MD

## 2015-10-11 NOTE — Assessment & Plan Note (Signed)
CXR

## 2015-10-12 ENCOUNTER — Telehealth: Payer: Self-pay | Admitting: *Deleted

## 2015-10-12 NOTE — Telephone Encounter (Signed)
Pt left a vm with questions about his current meds after his recent OV with PCP. I called pt- I left a detailed mess informing him that all the meds that were in question were sent in to [address] in [place]. I advised him to review his AVS that he should have received from Dr. Alain Marion before leaving on 10/11/15 OV.

## 2015-10-25 ENCOUNTER — Other Ambulatory Visit: Payer: Self-pay | Admitting: *Deleted

## 2015-10-25 MED ORDER — ACCU-CHEK FASTCLIX LANCETS MISC: 1.0000 | Freq: Two times a day (BID) | Status: DC

## 2015-11-08 DIAGNOSIS — Z08 Encounter for follow-up examination after completed treatment for malignant neoplasm: Secondary | ICD-10-CM | POA: Diagnosis not present

## 2015-11-08 DIAGNOSIS — Z85828 Personal history of other malignant neoplasm of skin: Secondary | ICD-10-CM | POA: Diagnosis not present

## 2015-11-08 DIAGNOSIS — L570 Actinic keratosis: Secondary | ICD-10-CM | POA: Diagnosis not present

## 2016-01-09 ENCOUNTER — Other Ambulatory Visit (INDEPENDENT_AMBULATORY_CARE_PROVIDER_SITE_OTHER): Payer: Medicare Other

## 2016-01-09 ENCOUNTER — Ambulatory Visit (INDEPENDENT_AMBULATORY_CARE_PROVIDER_SITE_OTHER): Payer: Medicare Other | Admitting: Internal Medicine

## 2016-01-09 ENCOUNTER — Encounter: Payer: Self-pay | Admitting: Internal Medicine

## 2016-01-09 VITALS — BP 150/74 | HR 70 | Wt 244.0 lb

## 2016-01-09 DIAGNOSIS — Z8546 Personal history of malignant neoplasm of prostate: Secondary | ICD-10-CM

## 2016-01-09 DIAGNOSIS — E785 Hyperlipidemia, unspecified: Secondary | ICD-10-CM

## 2016-01-09 DIAGNOSIS — T148 Other injury of unspecified body region: Secondary | ICD-10-CM

## 2016-01-09 DIAGNOSIS — I10 Essential (primary) hypertension: Secondary | ICD-10-CM | POA: Diagnosis not present

## 2016-01-09 DIAGNOSIS — E118 Type 2 diabetes mellitus with unspecified complications: Secondary | ICD-10-CM

## 2016-01-09 DIAGNOSIS — Z1211 Encounter for screening for malignant neoplasm of colon: Secondary | ICD-10-CM

## 2016-01-09 DIAGNOSIS — T148XXA Other injury of unspecified body region, initial encounter: Secondary | ICD-10-CM

## 2016-01-09 LAB — BASIC METABOLIC PANEL
BUN: 18 mg/dL (ref 6–23)
CO2: 27 mEq/L (ref 19–32)
Calcium: 9.3 mg/dL (ref 8.4–10.5)
Chloride: 105 mEq/L (ref 96–112)
Creatinine, Ser: 1.12 mg/dL (ref 0.40–1.50)
GFR: 68.09 mL/min (ref >60.00)
Glucose, Bld: 135 mg/dL — ABNORMAL HIGH (ref 70–99)
Potassium: 4.7 mEq/L (ref 3.5–5.1)
Sodium: 139 mEq/L (ref 135–145)

## 2016-01-09 LAB — HEMOGLOBIN A1C: HEMOGLOBIN A1C: 7.3 % — AB (ref 4.6–6.5)

## 2016-01-09 MED ORDER — GLIMEPIRIDE 2 MG PO TABS: 2.0000 mg | ORAL_TABLET | Freq: Every day | ORAL | Status: DC

## 2016-01-09 NOTE — Assessment & Plan Note (Signed)
Simvastatin 

## 2016-01-09 NOTE — Progress Notes (Signed)
Pre visit review using our clinic review tool, if applicable. No additional management support is needed unless otherwise documented below in the visit note. 

## 2016-01-09 NOTE — Assessment & Plan Note (Signed)
Not taking ASA Vit C

## 2016-01-09 NOTE — Assessment & Plan Note (Signed)
Dr Karsten Ro PSA q 1 year now

## 2016-01-09 NOTE — Assessment & Plan Note (Signed)
Lotensin, Triamt/HCT

## 2016-01-09 NOTE — Progress Notes (Signed)
Subjective:  Patient ID: Larry [name], male    DOB: 1941/04/21  Age: 75 y.o. MRN: [id number]  CC: No chief complaint on file.   HPI Larry [name] presents for DM, HTN, dyslipidemia f/u. CBGs 170-180 in am (ac)   Outpatient Prescriptions Prior to Visit  Medication Sig Dispense Refill  . ACCU-CHEK FASTCLIX LANCETS MISC 1 each by Does not apply route 2 (two) times daily. Dx:E11.9 100 each 5  . albuterol (PROVENTIL HFA;VENTOLIN HFA) 108 (90 Base) MCG/ACT inhaler Inhale 2 puffs into the lungs every 6 (six) hours as needed for wheezing or shortness of breath. 1 Inhaler 1  . aspirin 81 MG tablet Take 81 mg by mouth daily.      . benazepril (LOTENSIN) 40 MG tablet Take 1 tablet (40 mg total) by mouth daily. 90 tablet 3  . Cholecalciferol (VITAMIN D3) 1000 UNITS CAPS Take by mouth daily.     . fluticasone (FLONASE) 50 MCG/ACT nasal spray Place 2 sprays into both nostrils daily. 42 g 3  . Ginkgo Biloba 40 MG TABS Take by mouth daily.    [name] glucose blood (ACCU-CHEK SMARTVIEW) test strip 1 each by Other route 2 (two) times daily. Use to check blood sugars twice a day Dx E11.9 100 each 3  . glucose blood test strip 1 each by Other route 2 (two) times daily. Please dispense brand of choice per pt and insurance preference. Dx:250.00 100 each 5  . glucose monitoring kit (FREESTYLE) monitoring kit 1 each by Does not apply route as needed for other. Please dispense brand of choice per ins/patient. Dx: 250.00. 1 each 0  . metFORMIN (GLUCOPHAGE) 1000 MG tablet Take 1 tablet (1,000 mg total) by mouth 2 (two) times daily with a meal. 180 tablet 3  . Multiple Vitamins-Minerals (ICAPS) CAPS Take 1 capsule by mouth daily.      . Omega-3 Fatty Acids (OMEGA 3 PO) Take by mouth daily.    . simvastatin (ZOCOR) 40 MG tablet Take 1 tablet (40 mg total) by mouth at bedtime. 90 tablet 3  . triamterene-hydrochlorothiazide (MAXZIDE-25) 37.5-25 MG tablet Take 1 tablet by mouth daily. 90 tablet 3  . vitamin C (ASCORBIC  ACID) 500 MG tablet Take 1 tablet (500 mg total) by mouth daily. 100 tablet 3  . glimepiride (AMARYL) 2 MG tablet Take 1 tablet (2 mg total) by mouth daily with breakfast. 180 tablet 3   Facility-Administered Medications Prior to Visit  Medication Dose Route Frequency Provider Last Rate Last Dose  . pneumococcal 23 valent vaccine (PNU-IMMUNE) injection 0.5 mL  0.5 mL Intramuscular Once Aleksei Plotnikov V, MD        ROS Review of Systems  Constitutional: Negative for appetite change, fatigue and unexpected weight change.  HENT: Negative for congestion, nosebleeds, sneezing, sore throat and trouble swallowing.   Eyes: Negative for itching and visual disturbance.  Respiratory: Negative for cough.   Cardiovascular: Negative for chest pain, palpitations and leg swelling.  Gastrointestinal: Negative for nausea, diarrhea, blood in stool and abdominal distention.  Genitourinary: Negative for frequency and hematuria.  Musculoskeletal: Negative for back pain, joint swelling, gait problem and neck pain.  Skin: Negative for rash.  Neurological: Negative for dizziness, tremors, speech difficulty and weakness.  Psychiatric/Behavioral: Negative for sleep disturbance, dysphoric mood and agitation. The patient is not nervous/anxious.     Objective:  BP 150/74 mmHg  Pulse 70  Wt 244 lb (110.678 kg)  SpO2 97%  BP Readings from Last 3 Encounters:  01/09/16 150/74  10/11/15 162/84  08/26/15 120/60    Wt Readings from Last 3 Encounters:  01/09/16 244 lb (110.678 kg)  10/11/15 234 lb (106.142 kg)  08/26/15 236 lb (107.049 kg)    Physical Exam  Constitutional: He is oriented to person, place, and time. He appears well-developed. No distress.  NAD  HENT:  Mouth/Throat: Oropharynx is clear and moist.  Eyes: Conjunctivae are normal. Pupils are equal, round, and reactive to light.  Neck: Normal range of motion. No JVD present. No thyromegaly present.  Cardiovascular: Normal rate, regular rhythm,  normal heart sounds and intact distal pulses.  Exam reveals no gallop and no friction rub.   No murmur heard. Pulmonary/Chest: Effort normal and breath sounds normal. No respiratory distress. He has no wheezes. He has no rales. He exhibits no tenderness.  Abdominal: Soft. Bowel sounds are normal. He exhibits no distension and no mass. There is no tenderness. There is no rebound and no guarding.  Musculoskeletal: Normal range of motion. He exhibits no edema or tenderness.  Lymphadenopathy:    He has no cervical adenopathy.  Neurological: He is alert and oriented to person, place, and time. He has normal reflexes. No cranial nerve deficit. He exhibits normal muscle tone. He displays a negative Romberg sign. Coordination and gait normal.  Skin: Skin is warm and dry. No rash noted.  Psychiatric: He has a normal mood and affect. His behavior is normal. Judgment and thought content normal.  trace edema B  Lab Results  Component Value Date   WBC 10.3 07/13/2011   HGB 14.2 07/13/2011   HCT 42.9 07/13/2011   PLT 286.0 07/13/2011   GLUCOSE 180* 10/11/2015   CHOL 128 04/07/2014   TRIG 135.0 04/07/2014   HDL 38.30* 04/07/2014   LDLCALC 63 04/07/2014   ALT 48 04/23/2012   AST 42* 04/23/2012   NA 141 10/11/2015   K 3.8 10/11/2015   CL 102 10/11/2015   CREATININE 1.22 10/11/2015   BUN 23 10/11/2015   CO2 31 10/11/2015   TSH 2.12 12/17/2014   PSA 0.00 Repeated and verified X2.* 12/26/2012   HGBA1C 8.3* 10/11/2015    Dg Chest 2 View  10/11/2015  CLINICAL DATA:  Followup pneumonia EXAM: CHEST  2 VIEW COMPARISON:  08/24/2015 FINDINGS: The heart size and mediastinal contours are within normal limits. Both lungs are clear. The visualized skeletal structures are unremarkable. IMPRESSION: No active cardiopulmonary disease. Electronically Signed   By: Inez Catalina M.D.   On: 10/11/2015 08:52    Assessment & Plan:   Diagnoses and all orders for this visit:  Essential hypertension  Type 2  diabetes mellitus with complication, without long-term current use of insulin (HCC)  Dyslipidemia  Bruising  Other orders -     glimepiride (AMARYL) 2 MG tablet; Take 1 tablet (2 mg total) by mouth daily with breakfast.   I am having Larry [name] maintain his aspirin, ICAPS, Vitamin D3, Ginkgo Biloba, Omega-3 Fatty Acids (OMEGA 3 PO), glucose blood, glucose monitoring kit, vitamin C, albuterol, fluticasone, glucose blood, metFORMIN, simvastatin, benazepril, triamterene-hydrochlorothiazide, ACCU-CHEK FASTCLIX LANCETS, and glimepiride. We will continue to administer pneumococcal 23 valent vaccine.  Meds ordered this encounter  Medications  . glimepiride (AMARYL) 2 MG tablet    Sig: Take 1 tablet (2 mg total) by mouth daily with breakfast.    Dispense:  90 tablet    Refill:  3     Follow-up: Return in about 4 months (around 05/11/2016).  Walker Kehr, MD

## 2016-01-09 NOTE — Assessment & Plan Note (Signed)
On Metformin, Glimepiride  

## 2016-01-13 ENCOUNTER — Encounter: Payer: Self-pay | Admitting: Gastroenterology

## 2016-02-03 DIAGNOSIS — H353111 Nonexudative age-related macular degeneration, right eye, early dry stage: Secondary | ICD-10-CM | POA: Diagnosis not present

## 2016-02-03 DIAGNOSIS — H353121 Nonexudative age-related macular degeneration, left eye, early dry stage: Secondary | ICD-10-CM | POA: Diagnosis not present

## 2016-02-03 DIAGNOSIS — H35373 Puckering of macula, bilateral: Secondary | ICD-10-CM | POA: Diagnosis not present

## 2016-02-03 DIAGNOSIS — H40013 Open angle with borderline findings, low risk, bilateral: Secondary | ICD-10-CM | POA: Diagnosis not present

## 2016-02-03 DIAGNOSIS — H35031 Hypertensive retinopathy, right eye: Secondary | ICD-10-CM | POA: Diagnosis not present

## 2016-02-03 LAB — HM DIABETES EYE EXAM

## 2016-02-08 ENCOUNTER — Encounter: Payer: Self-pay | Admitting: Internal Medicine

## 2016-02-18 DIAGNOSIS — D126 Benign neoplasm of colon, unspecified: Secondary | ICD-10-CM

## 2016-02-18 HISTORY — DX: Benign neoplasm of colon, unspecified: D12.6

## 2016-02-29 ENCOUNTER — Ambulatory Visit (AMBULATORY_SURGERY_CENTER): Payer: Self-pay

## 2016-02-29 VITALS — Ht 72.0 in | Wt 238.2 lb

## 2016-02-29 DIAGNOSIS — Z1211 Encounter for screening for malignant neoplasm of colon: Secondary | ICD-10-CM

## 2016-02-29 MED ORDER — SUPREP BOWEL PREP KIT 17.5-3.13-1.6 GM/177ML PO SOLN: 1.0000 | Freq: Once | ORAL | Status: DC

## 2016-02-29 NOTE — Progress Notes (Signed)
No allergies to eggs or soy No past problems with anesthesia No diet meds No home oxygen  Has email and internet; declined emmi 

## 2016-03-14 ENCOUNTER — Ambulatory Visit (AMBULATORY_SURGERY_CENTER): Payer: Medicare Other | Admitting: Gastroenterology

## 2016-03-14 ENCOUNTER — Encounter: Payer: Self-pay | Admitting: Gastroenterology

## 2016-03-14 VITALS — BP 132/59 | HR 60 | Temp 98.0°F | Resp 11 | Ht 72.0 in | Wt 238.0 lb

## 2016-03-14 DIAGNOSIS — D122 Benign neoplasm of ascending colon: Secondary | ICD-10-CM

## 2016-03-14 DIAGNOSIS — D123 Benign neoplasm of transverse colon: Secondary | ICD-10-CM | POA: Diagnosis not present

## 2016-03-14 DIAGNOSIS — G4733 Obstructive sleep apnea (adult) (pediatric): Secondary | ICD-10-CM | POA: Diagnosis not present

## 2016-03-14 DIAGNOSIS — E119 Type 2 diabetes mellitus without complications: Secondary | ICD-10-CM | POA: Diagnosis not present

## 2016-03-14 DIAGNOSIS — Z1211 Encounter for screening for malignant neoplasm of colon: Secondary | ICD-10-CM | POA: Diagnosis present

## 2016-03-14 DIAGNOSIS — I10 Essential (primary) hypertension: Secondary | ICD-10-CM | POA: Diagnosis not present

## 2016-03-14 NOTE — Progress Notes (Signed)
Called to room to assist during endoscopic procedure.  Patient ID and intended procedure confirmed with present staff. Received instructions for my participation in the procedure from the performing physician.  

## 2016-03-14 NOTE — Op Note (Signed)
[place] Patient Name: Larry [name] Procedure Date: 03/14/2016 8:54 AM MRN: [id number] Endoscopist: Ladene Artist , MD Age: 75 Referring MD:  Date of Birth: May 24, 1941 Gender: Male Account #: [id number] Procedure:                Colonoscopy Indications:              Screening for colorectal malignant neoplasm Medicines:                Monitored Anesthesia Care Procedure:                Pre-Anesthesia Assessment:                           - Prior to the procedure, a History and Physical                            was performed, and patient medications and                            allergies were reviewed. The patient's tolerance of                            previous anesthesia was also reviewed. The risks                            and benefits of the procedure and the sedation                            options and risks were discussed with the patient.                            All questions were answered, and informed consent                            was obtained. Prior Anticoagulants: The patient has                            taken no previous anticoagulant or antiplatelet                            agents. ASA Grade Assessment: II - A patient with                            mild systemic disease. After reviewing the risks                            and benefits, the patient was deemed in                            satisfactory condition to undergo the procedure.                           After obtaining informed consent, the colonoscope  was passed under direct vision. Throughout the                            procedure, the patient's blood pressure, pulse, and                            oxygen saturations were monitored continuously. The                            Model [id number]-[id number]) scope was introduced                            through the anus and advanced to the the cecum,                            identified by  appendiceal orifice and ileocecal                            valve. The ileocecal valve, appendiceal orifice,                            and rectum were photographed. The quality of the                            bowel preparation was adequate. The colonoscopy was                            performed without difficulty. The patient tolerated                            the procedure well. Scope In: 9:07:30 AM Scope Out: 9:21:59 AM Scope Withdrawal Time: 0 hours 12 minutes 58 seconds  Total Procedure Duration: 0 hours 14 minutes 29 seconds  Findings:                 A 7 mm polyp was found in the ascending colon. The                            polyp was sessile. The polyp was removed with a                            cold snare. Resection and retrieval were complete.                           Internal hemorrhoids were found during                            retroflexion. The hemorrhoids were small and Grade                            I (internal hemorrhoids that do not prolapse).                           The exam was otherwise without abnormality on  direct and retroflexion views.                           A 4 mm polyp was found in the transverse colon. The                            polyp was sessile. The polyp was removed with a                            cold biopsy forceps. Resection and retrieval were                            complete. Complications:            No immediate complications. Estimated blood loss:                            None. Estimated Blood Loss:     Estimated blood loss: none. Impression:               - One 7 mm polyp in the ascending colon, removed                            with a cold snare. Resected and retrieved.                           - Internal hemorrhoids.                           - One 4 mm polyp in the transverse colon, removed                            with a cold biopsy forceps. Resected and retrieved. Recommendation:            - Repeat colonoscopy in 5 years for surveillance if                            polyp(s) are precancerous, otherwise no plans for                            future screening colonoscopies due to age.                           - Patient has a contact number available for                            emergencies. The signs and symptoms of potential                            delayed complications were discussed with the                            patient. Return to normal activities tomorrow.  Written discharge instructions were provided to the                            patient.                           - Resume previous diet.                           - Continue present medications.                           - Await pathology results. Ladene Artist, MD 03/14/2016 9:26:10 AM This report has been signed electronically.

## 2016-03-14 NOTE — Progress Notes (Signed)
No problems noted in the recovery room. maw 

## 2016-03-14 NOTE — Progress Notes (Signed)
To recovery, report to Willis, RN, VSS 

## 2016-03-14 NOTE — Patient Instructions (Signed)
YOU HAD AN ENDOSCOPIC PROCEDURE TODAY AT [facility]:   Refer to the procedure report that was given to you for any specific questions about what was found during the examination.  If the procedure report does not answer your questions, please call your gastroenterologist to clarify.  If you requested that your care partner not be given the details of your procedure findings, then the procedure report has been included in a sealed envelope for you to review at your convenience later.  YOU SHOULD EXPECT: Some feelings of bloating in the abdomen. Passage of more gas than usual.  Walking can help get rid of the air that was put into your GI tract during the procedure and reduce the bloating. If you had a lower endoscopy (such as a colonoscopy or flexible sigmoidoscopy) you may notice spotting of blood in your stool or on the toilet paper. If you underwent a bowel prep for your procedure, you may not have a normal bowel movement for a few days.  Please Note:  You might notice some irritation and congestion in your nose or some drainage.  This is from the oxygen used during your procedure.  There is no need for concern and it should clear up in a day or so.  SYMPTOMS TO REPORT IMMEDIATELY:   Following lower endoscopy (colonoscopy or flexible sigmoidoscopy):  Excessive amounts of blood in the stool  Significant tenderness or worsening of abdominal pains  Swelling of the abdomen that is new, acute  Fever of 100F or higher   For urgent or emergent issues, a gastroenterologist can be reached at any hour by calling [phone].   DIET: Your first meal following the procedure should be a small meal and then it is ok to progress to your normal diet. Heavy or fried foods are harder to digest and may make you feel nauseous or bloated.  Likewise, meals heavy in dairy and vegetables can increase bloating.  Drink plenty of fluids but you should avoid alcoholic beverages for 24  hours.  ACTIVITY:  You should plan to take it easy for the rest of today and you should NOT DRIVE or use heavy machinery until tomorrow (because of the sedation medicines used during the test).    FOLLOW UP: Our staff will call the number listed on your records the next business day following your procedure to check on you and address any questions or concerns that you may have regarding the information given to you following your procedure. If we do not reach you, we will leave a message.  However, if you are feeling well and you are not experiencing any problems, there is no need to return our call.  We will assume that you have returned to your regular daily activities without incident.  If any biopsies were taken you will be contacted by phone or by letter within the next 1-3 weeks.  Please call us at [phone] if you have not heard about the biopsies in 3 weeks.    SIGNATURES/CONFIDENTIALITY: You and/or your care partner have signed paperwork which will be entered into your electronic medical record.  These signatures attest to the fact that that the information above on your After Visit Summary has been reviewed and is understood.  Full responsibility of the confidentiality of this discharge information lies with you and/or your care-partner.    Handouts were given to your care partner on polyps and hemorrhoids. You may resume your current medications today. Await biopsy results. Please  call if any questions or concerns.

## 2016-03-15 ENCOUNTER — Telehealth: Payer: Self-pay | Admitting: *Deleted

## 2016-03-15 NOTE — Telephone Encounter (Signed)
  Follow up Call-  Call back number 03/14/2016  Post procedure Call Back phone  # [phone]  Permission to leave phone message Yes  Some recent data might be hidden     Patient questions:  Do you have a fever, pain , or abdominal swelling? No. Pain Score  0 *  Have you tolerated food without any problems? Yes.    Have you been able to return to your normal activities? Yes.    Do you have any questions about your discharge instructions: Diet   No. Medications  No. Follow up visit  No.  Do you have questions or concerns about your Care? No.  Actions: * If pain score is 4 or above: No action needed, pain <4.

## 2016-03-21 ENCOUNTER — Encounter: Payer: Self-pay | Admitting: Gastroenterology

## 2016-03-23 ENCOUNTER — Encounter: Payer: Self-pay | Admitting: Internal Medicine

## 2016-03-23 ENCOUNTER — Ambulatory Visit (INDEPENDENT_AMBULATORY_CARE_PROVIDER_SITE_OTHER): Payer: Medicare Other | Admitting: Internal Medicine

## 2016-03-23 DIAGNOSIS — M5117 Intervertebral disc disorders with radiculopathy, lumbosacral region: Secondary | ICD-10-CM

## 2016-03-23 MED ORDER — PREDNISONE 10 MG PO TABS: ORAL_TABLET | ORAL | 0 refills | Status: DC

## 2016-03-23 MED ORDER — HYDROCODONE-ACETAMINOPHEN 7.5-325 MG PO TABS: 1.0000 | ORAL_TABLET | Freq: Four times a day (QID) | ORAL | 0 refills | Status: DC | PRN

## 2016-03-23 NOTE — Progress Notes (Signed)
Subjective:  Patient ID: Larry [name], male    DOB: May 12, 1941  Age: 75 y.o. MRN: [id number]  CC: Leg Pain (Right x 1 week)   HPI Larry [name] presents for R leg pain shoots from the buttock to the side of the leg and down to the top of the R foot. Worse at night - can't sleep. Advil (4tabs) helped.  Pain is 8/10 at night. In the day it is 3-4. The pt stopped Maxzide  Outpatient Medications Prior to Visit  Medication Sig Dispense Refill  . ACCU-CHEK FASTCLIX LANCETS MISC 1 each by Does not apply route 2 (two) times daily. Dx:E11.9 100 each 5  . benazepril (LOTENSIN) 40 MG tablet Take 1 tablet (40 mg total) by mouth daily. 90 tablet 3  . Cholecalciferol (VITAMIN D3) 1000 UNITS CAPS Take by mouth daily.     . Ginkgo Biloba 40 MG TABS Take by mouth daily.    [name] glimepiride (AMARYL) 2 MG tablet Take 1 tablet (2 mg total) by mouth daily with breakfast. 90 tablet 3  . glucose blood (ACCU-CHEK SMARTVIEW) test strip 1 each by Other route 2 (two) times daily. Use to check blood sugars twice a day Dx E11.9 100 each 3  . glucose blood test strip 1 each by Other route 2 (two) times daily. Please dispense brand of choice per pt and insurance preference. Dx:250.00 100 each 5  . glucose monitoring kit (FREESTYLE) monitoring kit 1 each by Does not apply route as needed for other. Please dispense brand of choice per ins/patient. Dx: 250.00. 1 each 0  . metFORMIN (GLUCOPHAGE) 1000 MG tablet Take 1 tablet (1,000 mg total) by mouth 2 (two) times daily with a meal. 180 tablet 3  . Multiple Vitamins-Minerals (ICAPS) CAPS Take 1 capsule by mouth daily.      . Omega-3 Fatty Acids (OMEGA 3 PO) Take by mouth daily.    . simvastatin (ZOCOR) 40 MG tablet Take 1 tablet (40 mg total) by mouth at bedtime. 90 tablet 3  . vitamin C (ASCORBIC ACID) 500 MG tablet Take 1 tablet (500 mg total) by mouth daily. (Patient taking differently: Take 1,000 mg by mouth daily. 1000 mg) 100 tablet 3  .  triamterene-hydrochlorothiazide (MAXZIDE-25) 37.5-25 MG tablet Take 1 tablet by mouth daily. (Patient not taking: Reported on 03/23/2016) 90 tablet 3   Facility-Administered Medications Prior to Visit  Medication Dose Route Frequency Provider Last Rate Last Dose  . pneumococcal 23 valent vaccine (PNU-IMMUNE) injection 0.5 mL  0.5 mL Intramuscular Once Cassandria Anger, MD        ROS Review of Systems  Constitutional: Negative for appetite change, fatigue and unexpected weight change.  HENT: Negative for congestion, nosebleeds, sneezing, sore throat and trouble swallowing.   Eyes: Negative for itching and visual disturbance.  Respiratory: Negative for cough.   Cardiovascular: Negative for chest pain, palpitations and leg swelling.  Gastrointestinal: Negative for abdominal distention, blood in stool, diarrhea and nausea.  Genitourinary: Negative for frequency and hematuria.  Musculoskeletal: Positive for back pain and gait problem. Negative for joint swelling and neck pain.  Skin: Negative for rash.  Neurological: Negative for dizziness, tremors, speech difficulty and weakness.  Psychiatric/Behavioral: Negative for agitation, dysphoric mood and sleep disturbance. The patient is not nervous/anxious.     Objective:  BP (!) 170/78   Pulse 69   Wt 242 lb (109.8 kg)   SpO2 97%   BMI 32.82 kg/m   BP Readings from Last 3 Encounters:  03/23/16 (!) 170/78  03/14/16 (!) 132/59  01/09/16 (!) 150/74    Wt Readings from Last 3 Encounters:  03/23/16 242 lb (109.8 kg)  03/14/16 238 lb (108 kg)  02/29/16 238 lb 3.2 oz (108 kg)    Physical Exam  Constitutional: He is oriented to person, place, and time. He appears well-developed. No distress.  NAD  HENT:  Mouth/Throat: Oropharynx is clear and moist.  Eyes: Conjunctivae are normal. Pupils are equal, round, and reactive to light.  Neck: Normal range of motion. No JVD present. No thyromegaly present.  Cardiovascular: Normal rate, regular  rhythm, normal heart sounds and intact distal pulses.  Exam reveals no gallop and no friction rub.   No murmur heard. Pulmonary/Chest: Effort normal and breath sounds normal. No respiratory distress. He has no wheezes. He has no rales. He exhibits no tenderness.  Abdominal: Soft. Bowel sounds are normal. He exhibits no distension and no mass. There is no tenderness. There is no rebound and no guarding.  Musculoskeletal: Normal range of motion. He exhibits edema and tenderness.  Lymphadenopathy:    He has no cervical adenopathy.  Neurological: He is alert and oriented to person, place, and time. He has normal reflexes. No cranial nerve deficit. He exhibits normal muscle tone. He displays a negative Romberg sign. Coordination and gait normal.  Skin: Skin is warm and dry. No rash noted.  Psychiatric: He has a normal mood and affect. His behavior is normal. Judgment and thought content normal.  !+ edema B ankles R leg - str leg elev is (+) on the R  >20 min  Lab Results  Component Value Date   WBC 10.3 07/13/2011   HGB 14.2 07/13/2011   HCT 42.9 07/13/2011   PLT 286.0 07/13/2011   GLUCOSE 135 (H) 01/09/2016   CHOL 128 04/07/2014   TRIG 135.0 04/07/2014   HDL 38.30 (L) 04/07/2014   LDLCALC 63 04/07/2014   ALT 48 04/23/2012   AST 42 (H) 04/23/2012   NA 139 01/09/2016   K 4.7 01/09/2016   CL 105 01/09/2016   CREATININE 1.12 01/09/2016   BUN 18 01/09/2016   CO2 27 01/09/2016   TSH 2.12 12/17/2014   PSA 0.00 Repeated and verified X2. (L) 12/26/2012   HGBA1C 7.3 (H) 01/09/2016    Dg Chest 2 View  Result Date: 10/11/2015 CLINICAL DATA:  Followup pneumonia EXAM: CHEST  2 VIEW COMPARISON:  08/24/2015 FINDINGS: The heart size and mediastinal contours are within normal limits. Both lungs are clear. The visualized skeletal structures are unremarkable. IMPRESSION: No active cardiopulmonary disease. Electronically Signed   By: Inez Catalina M.D.   On: 10/11/2015 08:52    Assessment & Plan:     There are no diagnoses linked to this encounter. I am having Mr. Tinnon maintain his ICAPS, Vitamin D3, Ginkgo Biloba, Omega-3 Fatty Acids (OMEGA 3 PO), glucose blood, glucose monitoring kit, vitamin C, glucose blood, metFORMIN, simvastatin, benazepril, triamterene-hydrochlorothiazide, ACCU-CHEK FASTCLIX LANCETS, and glimepiride. We will continue to administer pneumococcal 23 valent vaccine.  No orders of the defined types were placed in this encounter.    Follow-up: No Follow-up on file.  Walker Kehr, MD

## 2016-03-23 NOTE — Assessment & Plan Note (Signed)
8/17 R - severe Prednisone 10 mg: take 4 tabs a day x 3 days; then 3 tabs a day x 4 days; then 2 tabs a day x 4 days, then 1 tab a day x 6 days, then stop. Take pc.  Potential benefits of steroid  use as well as potential risks  and complications were explained to the patient and were aknowledged. Norco prn  Potential benefits of opioids use as well as potential risks (i.e. addiction risk, apnea etc) and complications (i.e. Somnolence, constipation and others) were explained to the patient and were aknowledged. MRI LS spine Dr Ellene Route if needed

## 2016-03-23 NOTE — Progress Notes (Signed)
Pre visit review using our clinic review tool, if applicable. No additional management support is needed unless otherwise documented below in the visit note. 

## 2016-03-23 NOTE — Patient Instructions (Signed)
Sciatica With Rehab The sciatic nerve runs from the back down the leg and is responsible for sensation and control of the muscles in the back (posterior) side of the thigh, lower leg, and foot. Sciatica is a condition that is characterized by inflammation of this nerve.  SYMPTOMS   Signs of nerve damage, including numbness and/or weakness along the posterior side of the lower extremity.  Pain in the back of the thigh that may also travel down the leg.  Pain that worsens when sitting for long periods of time.  Occasionally, pain in the back or buttock. CAUSES  Inflammation of the sciatic nerve is the cause of sciatica. The inflammation is due to something irritating the nerve. Common sources of irritation include:  Sitting for long periods of time.  Direct trauma to the nerve.  Arthritis of the spine.  Herniated or ruptured disk.  Slipping of the vertebrae (spondylolisthesis).  Pressure from soft tissues, such as muscles or ligament-like tissue (fascia). RISK INCREASES WITH:  Sports that place pressure or stress on the spine (football or weightlifting).  Poor strength and flexibility.  Failure to warm up properly before activity.  Family history of low back pain or disk disorders.  Previous back injury or surgery.  Poor body mechanics, especially when lifting, or poor posture. PREVENTION   Warm up and stretch properly before activity.  Maintain physical fitness:  Strength, flexibility, and endurance.  Cardiovascular fitness.  Learn and use proper technique, especially with posture and lifting. When possible, have coach correct improper technique.  Avoid activities that place stress on the spine. PROGNOSIS If treated properly, then sciatica usually resolves within 6 weeks. However, occasionally surgery is necessary.  RELATED COMPLICATIONS   Permanent nerve damage, including pain, numbness, tingle, or weakness.  Chronic back pain.  Risks of surgery: infection,  bleeding, nerve damage, or damage to surrounding tissues. TREATMENT Treatment initially involves resting from any activities that aggravate your symptoms. The use of ice and medication may help reduce pain and inflammation. The use of strengthening and stretching exercises may help reduce pain with activity. These exercises may be performed at home or with referral to a therapist. A therapist may recommend further treatments, such as transcutaneous electronic nerve stimulation (TENS) or ultrasound. Your caregiver may recommend corticosteroid injections to help reduce inflammation of the sciatic nerve. If symptoms persist despite non-surgical (conservative) treatment, then surgery may be recommended. MEDICATION  If pain medication is necessary, then nonsteroidal anti-inflammatory medications, such as aspirin and ibuprofen, or other minor pain relievers, such as acetaminophen, are often recommended.  Do not take pain medication for 7 days before surgery.  Prescription pain relievers may be given if deemed necessary by your caregiver. Use only as directed and only as much as you need.  Ointments applied to the skin may be helpful.  Corticosteroid injections may be given by your caregiver. These injections should be reserved for the most serious cases, because they may only be given a certain number of times. HEAT AND COLD  Cold treatment (icing) relieves pain and reduces inflammation. Cold treatment should be applied for 10 to 15 minutes every 2 to 3 hours for inflammation and pain and immediately after any activity that aggravates your symptoms. Use ice packs or massage the area with a piece of ice (ice massage).  Heat treatment may be used prior to performing the stretching and strengthening activities prescribed by your caregiver, physical therapist, or athletic trainer. Use a heat pack or soak the injury in warm water.   SEEK MEDICAL CARE IF:  Treatment seems to offer no benefit, or the condition  worsens.  Any medications produce adverse side effects. EXERCISES  RANGE OF MOTION (ROM) AND STRETCHING EXERCISES - Sciatica Most people with sciatic will find that their symptoms worsen with either excessive bending forward (flexion) or arching at the low back (extension). The exercises which will help resolve your symptoms will focus on the opposite motion. Your physician, physical therapist or athletic trainer will help you determine which exercises will be most helpful to resolve your low back pain. Do not complete any exercises without first consulting with your clinician. Discontinue any exercises which worsen your symptoms until you speak to your clinician. If you have pain, numbness or tingling which travels down into your buttocks, leg or foot, the goal of the therapy is for these symptoms to move closer to your back and eventually resolve. Occasionally, these leg symptoms will get better, but your low back pain may worsen; this is typically an indication of progress in your rehabilitation. Be certain to be very alert to any changes in your symptoms and the activities in which you participated in the 24 hours prior to the change. Sharing this information with your clinician will allow him/her to most efficiently treat your condition. These exercises may help you when beginning to rehabilitate your injury. Your symptoms may resolve with or without further involvement from your physician, physical therapist or athletic trainer. While completing these exercises, remember:   Restoring tissue flexibility helps normal motion to return to the joints. This allows healthier, less painful movement and activity.  An effective stretch should be held for at least 30 seconds.  A stretch should never be painful. You should only feel a gentle lengthening or release in the stretched tissue. FLEXION RANGE OF MOTION AND STRETCHING EXERCISES: STRETCH - Flexion, Single Knee to Chest   Lie on a firm bed or floor  with both legs extended in front of you.  Keeping one leg in contact with the floor, bring your opposite knee to your chest. Hold your leg in place by either grabbing behind your thigh or at your knee.  Pull until you feel a gentle stretch in your low back. Hold __________ seconds.  Slowly release your grasp and repeat the exercise with the opposite side. Repeat __________ times. Complete this exercise __________ times per day.  STRETCH - Flexion, Double Knee to Chest  Lie on a firm bed or floor with both legs extended in front of you.  Keeping one leg in contact with the floor, bring your opposite knee to your chest.  Tense your stomach muscles to support your back and then lift your other knee to your chest. Hold your legs in place by either grabbing behind your thighs or at your knees.  Pull both knees toward your chest until you feel a gentle stretch in your low back. Hold __________ seconds.  Tense your stomach muscles and slowly return one leg at a time to the floor. Repeat __________ times. Complete this exercise __________ times per day.  STRETCH - Low Trunk Rotation   Lie on a firm bed or floor. Keeping your legs in front of you, bend your knees so they are both pointed toward the ceiling and your feet are flat on the floor.  Extend your arms out to the side. This will stabilize your upper body by keeping your shoulders in contact with the floor.  Gently and slowly drop both knees together to one side until   you feel a gentle stretch in your low back. Hold for __________ seconds.  Tense your stomach muscles to support your low back as you bring your knees back to the starting position. Repeat the exercise to the other side. Repeat __________ times. Complete this exercise __________ times per day  EXTENSION RANGE OF MOTION AND FLEXIBILITY EXERCISES: STRETCH - Extension, Prone on Elbows  Lie on your stomach on the floor, a bed will be too soft. Place your palms about shoulder  width apart and at the height of your head.  Place your elbows under your shoulders. If this is too painful, stack pillows under your chest.  Allow your body to relax so that your hips drop lower and make contact more completely with the floor.  Hold this position for __________ seconds.  Slowly return to lying flat on the floor. Repeat __________ times. Complete this exercise __________ times per day.  RANGE OF MOTION - Extension, Prone Press Ups  Lie on your stomach on the floor, a bed will be too soft. Place your palms about shoulder width apart and at the height of your head.  Keeping your back as relaxed as possible, slowly straighten your elbows while keeping your hips on the floor. You may adjust the placement of your hands to maximize your comfort. As you gain motion, your hands will come more underneath your shoulders.  Hold this position __________ seconds.  Slowly return to lying flat on the floor. Repeat __________ times. Complete this exercise __________ times per day.  STRENGTHENING EXERCISES - Sciatica  These exercises may help you when beginning to rehabilitate your injury. These exercises should be done near your "sweet spot." This is the neutral, low-back arch, somewhere between fully rounded and fully arched, that is your least painful position. When performed in this safe range of motion, these exercises can be used for people who have either a flexion or extension based injury. These exercises may resolve your symptoms with or without further involvement from your physician, physical therapist or athletic trainer. While completing these exercises, remember:   Muscles can gain both the endurance and the strength needed for everyday activities through controlled exercises.  Complete these exercises as instructed by your physician, physical therapist or athletic trainer. Progress with the resistance and repetition exercises only as your caregiver advises.  You may  experience muscle soreness or fatigue, but the pain or discomfort you are trying to eliminate should never worsen during these exercises. If this pain does worsen, stop and make certain you are following the directions exactly. If the pain is still present after adjustments, discontinue the exercise until you can discuss the trouble with your clinician. STRENGTHENING - Deep Abdominals, Pelvic Tilt   Lie on a firm bed or floor. Keeping your legs in front of you, bend your knees so they are both pointed toward the ceiling and your feet are flat on the floor.  Tense your lower abdominal muscles to press your low back into the floor. This motion will rotate your pelvis so that your tail bone is scooping upwards rather than pointing at your feet or into the floor.  With a gentle tension and even breathing, hold this position for __________ seconds. Repeat __________ times. Complete this exercise __________ times per day.  STRENGTHENING - Abdominals, Crunches   Lie on a firm bed or floor. Keeping your legs in front of you, bend your knees so they are both pointed toward the ceiling and your feet are flat on the   floor. Cross your arms over your chest.  Slightly tip your chin down without bending your neck.  Tense your abdominals and slowly lift your trunk high enough to just clear your shoulder blades. Lifting higher can put excessive stress on the low back and does not further strengthen your abdominal muscles.  Control your return to the starting position. Repeat __________ times. Complete this exercise __________ times per day.  STRENGTHENING - Quadruped, Opposite UE/LE Lift  Assume a hands and knees position on a firm surface. Keep your hands under your shoulders and your knees under your hips. You may place padding under your knees for comfort.  Find your neutral spine and gently tense your abdominal muscles so that you can maintain this position. Your shoulders and hips should form a rectangle  that is parallel with the floor and is not twisted.  Keeping your trunk steady, lift your right hand no higher than your shoulder and then your left leg no higher than your hip. Make sure you are not holding your breath. Hold this position __________ seconds.  Continuing to keep your abdominal muscles tense and your back steady, slowly return to your starting position. Repeat with the opposite arm and leg. Repeat __________ times. Complete this exercise __________ times per day.  STRENGTHENING - Abdominals and Quadriceps, Straight Leg Raise   Lie on a firm bed or floor with both legs extended in front of you.  Keeping one leg in contact with the floor, bend the other knee so that your foot can rest flat on the floor.  Find your neutral spine, and tense your abdominal muscles to maintain your spinal position throughout the exercise.  Slowly lift your straight leg off the floor about 6 inches for a count of 15, making sure to not hold your breath.  Still keeping your neutral spine, slowly lower your leg all the way to the floor. Repeat this exercise with each leg __________ times. Complete this exercise __________ times per day. POSTURE AND BODY MECHANICS CONSIDERATIONS - Sciatica Keeping correct posture when sitting, standing or completing your activities will reduce the stress put on different body tissues, allowing injured tissues a chance to heal and limiting painful experiences. The following are general guidelines for improved posture. Your physician or physical therapist will provide you with any instructions specific to your needs. While reading these guidelines, remember:  The exercises prescribed by your provider will help you have the flexibility and strength to maintain correct postures.  The correct posture provides the optimal environment for your joints to work. All of your joints have less wear and tear when properly supported by a spine with good posture. This means you will  experience a healthier, less painful body.  Correct posture must be practiced with all of your activities, especially prolonged sitting and standing. Correct posture is as important when doing repetitive low-stress activities (typing) as it is when doing a single heavy-load activity (lifting). RESTING POSITIONS Consider which positions are most painful for you when choosing a resting position. If you have pain with flexion-based activities (sitting, bending, stooping, squatting), choose a position that allows you to rest in a less flexed posture. You would want to avoid curling into a fetal position on your side. If your pain worsens with extension-based activities (prolonged standing, working overhead), avoid resting in an extended position such as sleeping on your stomach. Most people will find more comfort when they rest with their spine in a more neutral position, neither too rounded nor too   arched. Lying on a non-sagging bed on your side with a pillow between your knees, or on your back with a pillow under your knees will often provide some relief. Keep in mind, being in any one position for a prolonged period of time, no matter how correct your posture, can still lead to stiffness. PROPER SITTING POSTURE In order to minimize stress and discomfort on your spine, you must sit with correct posture Sitting with good posture should be effortless for a healthy body. Returning to good posture is a gradual process. Many people can work toward this most comfortably by using various supports until they have the flexibility and strength to maintain this posture on their own. When sitting with proper posture, your ears will fall over your shoulders and your shoulders will fall over your hips. You should use the back of the chair to support your upper back. Your low back will be in a neutral position, just slightly arched. You may place a small pillow or folded towel at the base of your low back for support.  When  working at a desk, create an environment that supports good, upright posture. Without extra support, muscles fatigue and lead to excessive strain on joints and other tissues. Keep these recommendations in mind: CHAIR:   A chair should be able to slide under your desk when your back makes contact with the back of the chair. This allows you to work closely.  The chair's height should allow your eyes to be level with the upper part of your monitor and your hands to be slightly lower than your elbows. BODY POSITION  Your feet should make contact with the floor. If this is not possible, use a foot rest.  Keep your ears over your shoulders. This will reduce stress on your neck and low back. INCORRECT SITTING POSTURES   If you are feeling tired and unable to assume a healthy sitting posture, do not slouch or slump. This puts excessive strain on your back tissues, causing more damage and pain. Healthier options include:  Using more support, like a lumbar pillow.  Switching tasks to something that requires you to be upright or walking.  Talking a brief walk.  Lying down to rest in a neutral-spine position. PROLONGED STANDING WHILE SLIGHTLY LEANING FORWARD  When completing a task that requires you to lean forward while standing in one place for a long time, place either foot up on a stationary 2-4 inch high object to help maintain the best posture. When both feet are on the ground, the low back tends to lose its slight inward curve. If this curve flattens (or becomes too large), then the back and your other joints will experience too much stress, fatigue more quickly and can cause pain.  CORRECT STANDING POSTURES Proper standing posture should be assumed with all daily activities, even if they only take a few moments, like when brushing your teeth. As in sitting, your ears should fall over your shoulders and your shoulders should fall over your hips. You should keep a slight tension in your abdominal  muscles to brace your spine. Your tailbone should point down to the ground, not behind your body, resulting in an over-extended swayback posture.  INCORRECT STANDING POSTURES  Common incorrect standing postures include a forward head, locked knees and/or an excessive swayback. WALKING Walk with an upright posture. Your ears, shoulders and hips should all line-up. PROLONGED ACTIVITY IN A FLEXED POSITION When completing a task that requires you to bend forward   at your waist or lean over a low surface, try to find a way to stabilize 3 of 4 of your limbs. You can place a hand or elbow on your thigh or rest a knee on the surface you are reaching across. This will provide you more stability so that your muscles do not fatigue as quickly. By keeping your knees relaxed, or slightly bent, you will also reduce stress across your low back. CORRECT LIFTING TECHNIQUES DO :   Assume a wide stance. This will provide you more stability and the opportunity to get as close as possible to the object which you are lifting.  Tense your abdominals to brace your spine; then bend at the knees and hips. Keeping your back locked in a neutral-spine position, lift using your leg muscles. Lift with your legs, keeping your back straight.  Test the weight of unknown objects before attempting to lift them.  Try to keep your elbows locked down at your sides in order get the best strength from your shoulders when carrying an object.  Always ask for help when lifting heavy or awkward objects. INCORRECT LIFTING TECHNIQUES DO NOT:   Lock your knees when lifting, even if it is a small object.  Bend and twist. Pivot at your feet or move your feet when needing to change directions.  Assume that you cannot safely pick up a paperclip without proper posture.   This information is not intended to replace advice given to you by your health care provider. Make sure you discuss any questions you have with your health care provider.     Document Released: 08/06/2005 Document Revised: 12/21/2014 Document Reviewed: 11/18/2008 [employer] Patient Education 2016 [employer]  

## 2016-04-04 ENCOUNTER — Ambulatory Visit
Admission: RE | Admit: 2016-04-04 | Discharge: 2016-04-04 | Disposition: A | Discharge status: Home / Self Care | Payer: Medicare Other | Source: Ambulatory Visit | Attending: Internal Medicine | Admitting: Internal Medicine

## 2016-04-04 DIAGNOSIS — M5126 Other intervertebral disc displacement, lumbar region: Secondary | ICD-10-CM | POA: Diagnosis not present

## 2016-04-04 DIAGNOSIS — M5117 Intervertebral disc disorders with radiculopathy, lumbosacral region: Secondary | ICD-10-CM

## 2016-04-06 ENCOUNTER — Encounter: Payer: Self-pay | Admitting: Internal Medicine

## 2016-04-06 ENCOUNTER — Ambulatory Visit (INDEPENDENT_AMBULATORY_CARE_PROVIDER_SITE_OTHER): Payer: Medicare Other | Admitting: Internal Medicine

## 2016-04-06 DIAGNOSIS — I10 Essential (primary) hypertension: Secondary | ICD-10-CM

## 2016-04-06 DIAGNOSIS — M5441 Lumbago with sciatica, right side: Secondary | ICD-10-CM | POA: Diagnosis not present

## 2016-04-06 DIAGNOSIS — E118 Type 2 diabetes mellitus with unspecified complications: Secondary | ICD-10-CM | POA: Diagnosis not present

## 2016-04-06 DIAGNOSIS — R252 Cramp and spasm: Secondary | ICD-10-CM | POA: Insufficient documentation

## 2016-04-06 DIAGNOSIS — R600 Localized edema: Secondary | ICD-10-CM

## 2016-04-06 NOTE — Assessment & Plan Note (Signed)
On Metformin, Glimepiride  

## 2016-04-06 NOTE — Progress Notes (Signed)
Subjective:  Patient ID: Larry [name], male    DOB: 06/17/1941  Age: 75 y.o. MRN: [id number]  CC: No chief complaint on file.   HPI ESTEFAN PATTISON presents for LBP, leg pain - resolved. F/u HTN - BP ok at home C/o bad LE cramps last night  Outpatient Medications Prior to Visit  Medication Sig Dispense Refill  . ACCU-CHEK FASTCLIX LANCETS MISC 1 each by Does not apply route 2 (two) times daily. Dx:E11.9 100 each 5  . benazepril (LOTENSIN) 40 MG tablet Take 1 tablet (40 mg total) by mouth daily. 90 tablet 3  . Cholecalciferol (VITAMIN D3) 1000 UNITS CAPS Take by mouth daily.     . Ginkgo Biloba 40 MG TABS Take by mouth daily.    [name] glimepiride (AMARYL) 2 MG tablet Take 1 tablet (2 mg total) by mouth daily with breakfast. 90 tablet 3  . glucose blood (ACCU-CHEK SMARTVIEW) test strip 1 each by Other route 2 (two) times daily. Use to check blood sugars twice a day Dx E11.9 100 each 3  . glucose blood test strip 1 each by Other route 2 (two) times daily. Please dispense brand of choice per pt and insurance preference. Dx:250.00 100 each 5  . glucose monitoring kit (FREESTYLE) monitoring kit 1 each by Does not apply route as needed for other. Please dispense brand of choice per ins/patient. Dx: 250.00. 1 each 0  . HYDROcodone-acetaminophen (NORCO) 7.5-325 MG tablet Take 1 tablet by mouth 4 (four) times daily as needed for moderate pain. 40 tablet 0  . metFORMIN (GLUCOPHAGE) 1000 MG tablet Take 1 tablet (1,000 mg total) by mouth 2 (two) times daily with a meal. 180 tablet 3  . Multiple Vitamins-Minerals (ICAPS) CAPS Take 1 capsule by mouth daily.      . Omega-3 Fatty Acids (OMEGA 3 PO) Take by mouth daily.    . predniSONE (DELTASONE) 10 MG tablet Prednisone 10 mg: take 4 tabs a day x 3 days; then 3 tabs a day x 4 days; then 2 tabs a day x 4 days, then 1 tab a day x 6 days, then stop. Take pc. 38 tablet 0  . simvastatin (ZOCOR) 40 MG tablet Take 1 tablet (40 mg total) by mouth at bedtime. 90  tablet 3  . triamterene-hydrochlorothiazide (MAXZIDE-25) 37.5-25 MG tablet Take 1 tablet by mouth daily. 90 tablet 3  . vitamin C (ASCORBIC ACID) 500 MG tablet Take 1 tablet (500 mg total) by mouth daily. (Patient taking differently: Take 1,000 mg by mouth daily. 1000 mg) 100 tablet 3   Facility-Administered Medications Prior to Visit  Medication Dose Route Frequency Provider Last Rate Last Dose  . pneumococcal 23 valent vaccine (PNU-IMMUNE) injection 0.5 mL  0.5 mL Intramuscular Once Cassandria Anger, MD        ROS Review of Systems  Constitutional: Negative for appetite change, fatigue and unexpected weight change.  HENT: Negative for congestion, nosebleeds, sneezing, sore throat and trouble swallowing.   Eyes: Negative for itching and visual disturbance.  Respiratory: Negative for cough.   Cardiovascular: Negative for chest pain, palpitations and leg swelling.  Gastrointestinal: Negative for abdominal distention, blood in stool, diarrhea and nausea.  Genitourinary: Negative for frequency and hematuria.  Musculoskeletal: Negative for back pain, gait problem, joint swelling and neck pain.  Skin: Negative for rash.  Neurological: Negative for dizziness, tremors, speech difficulty and weakness.  Psychiatric/Behavioral: Negative for agitation, dysphoric mood and sleep disturbance. The patient is not nervous/anxious.  Objective:  BP (!) 162/60 (BP Location: Right Arm, Patient Position: Sitting, Cuff Size: Normal)   Pulse 80   Temp 97.9 F (36.6 C) (Oral)   Ht 6' (1.829 m)   Wt 235 lb 8 oz (106.8 kg)   SpO2 98%   BMI 31.94 kg/m   BP Readings from Last 3 Encounters:  04/06/16 (!) 162/60  03/23/16 (!) 170/78  03/14/16 (!) 132/59    Wt Readings from Last 3 Encounters:  04/06/16 235 lb 8 oz (106.8 kg)  04/04/16 240 lb (108.9 kg)  03/23/16 242 lb (109.8 kg)    Physical Exam  Constitutional: He is oriented to person, place, and time. He appears well-developed. No distress.   NAD  HENT:  Mouth/Throat: Oropharynx is clear and moist.  Eyes: Conjunctivae are normal. Pupils are equal, round, and reactive to light.  Neck: Normal range of motion. No JVD present. No thyromegaly present.  Cardiovascular: Normal rate, regular rhythm, normal heart sounds and intact distal pulses.  Exam reveals no gallop and no friction rub.   No murmur heard. Pulmonary/Chest: Effort normal and breath sounds normal. No respiratory distress. He has no wheezes. He has no rales. He exhibits no tenderness.  Abdominal: Soft. Bowel sounds are normal. He exhibits no distension and no mass. There is no tenderness. There is no rebound and no guarding.  Musculoskeletal: Normal range of motion. He exhibits no edema or tenderness.  Lymphadenopathy:    He has no cervical adenopathy.  Neurological: He is alert and oriented to person, place, and time. He has normal reflexes. No cranial nerve deficit. He exhibits normal muscle tone. He displays a negative Romberg sign. Coordination and gait normal.  Skin: Skin is warm and dry. No rash noted.  Psychiatric: He has a normal mood and affect. His behavior is normal. Judgment and thought content normal.  1+ ankle edema  Lab Results  Component Value Date   WBC 10.3 07/13/2011   HGB 14.2 07/13/2011   HCT 42.9 07/13/2011   PLT 286.0 07/13/2011   GLUCOSE 135 (H) 01/09/2016   CHOL 128 04/07/2014   TRIG 135.0 04/07/2014   HDL 38.30 (L) 04/07/2014   LDLCALC 63 04/07/2014   ALT 48 04/23/2012   AST 42 (H) 04/23/2012   NA 139 01/09/2016   K 4.7 01/09/2016   CL 105 01/09/2016   CREATININE 1.12 01/09/2016   BUN 18 01/09/2016   CO2 27 01/09/2016   TSH 2.12 12/17/2014   PSA 0.00 Repeated and verified X2. (L) 12/26/2012   HGBA1C 7.3 (H) 01/09/2016    Mr Lumbar Spine Wo Contrast  Result Date: 04/04/2016 CLINICAL DATA:  Sciatic radiculitis. Right leg pain. Prostate cancer. EXAM: MRI LUMBAR SPINE WITHOUT CONTRAST TECHNIQUE: Multiplanar, multisequence MR  imaging of the lumbar spine was performed. No intravenous contrast was administered. COMPARISON:  None. 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 Iliolumbar ligament attached to transverse process of L5. Alignment:  Normal Vertebrae: Negative for fracture or mass lesion. Normal bone marrow Conus medullaris: Extends to the mid L1 level and appears normal. Paraspinal and other soft tissues: Paraspinous soft tissues normal. No retroperitoneal mass or adenopathy. Disc levels: T12-L1: Central disc protrusion. Sagittal images only through this level L1-2:  Negative L2-3: Disc degeneration with diffuse disc bulging. Small central disc protrusion. No significant spinal or foraminal stenosis L3-4: Disc degeneration and disc bulging. Small broad-based central disc protrusion. Bilateral facet hypertrophy with mild spinal stenosis. Mild foraminal stenosis bilaterally L4-5:  Disc degeneration with disc bulging. Small central disc protrusion. Mild facet and ligamentum flavum hypertrophy causing mild spinal stenosis. 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 Electronically Signed   By: Franchot Gallo M.D.   On: 04/04/2016 08:11    Assessment & Plan:   There are no diagnoses linked to this encounter. I am having Mr. Wahba maintain his ICAPS, Vitamin D3, Ginkgo Biloba, Omega-3 Fatty Acids (OMEGA 3 PO), glucose blood, glucose monitoring kit, vitamin C, glucose blood, metFORMIN,  simvastatin, benazepril, triamterene-hydrochlorothiazide, ACCU-CHEK FASTCLIX LANCETS, glimepiride, HYDROcodone-acetaminophen, and predniSONE. We will continue to administer pneumococcal 23 valent vaccine.  No orders of the defined types were placed in this encounter.    Follow-up: No Follow-up on file.  Walker Kehr, MD

## 2016-04-06 NOTE — Assessment & Plan Note (Addendum)
Discussed  Mild - pt does not want to change meds

## 2016-04-06 NOTE — Assessment & Plan Note (Signed)
Tylenol pm prn Hold Zocor or Maxzide if needed

## 2016-04-06 NOTE — Progress Notes (Signed)
Pre visit review using our clinic review tool, if applicable. No additional management support is needed unless otherwise documented below in the visit note. 

## 2016-04-06 NOTE — Patient Instructions (Signed)
Tylenol PM or Benadryl for cramps

## 2016-04-06 NOTE — Assessment & Plan Note (Signed)
Lotensin, Triamt/HCT

## 2016-04-06 NOTE — Assessment & Plan Note (Signed)
Radiculopathy on the R - resolved MRI - OK

## 2016-04-10 ENCOUNTER — Ambulatory Visit: Payer: Medicare Other | Admitting: Internal Medicine

## 2016-05-08 DIAGNOSIS — Z08 Encounter for follow-up examination after completed treatment for malignant neoplasm: Secondary | ICD-10-CM | POA: Diagnosis not present

## 2016-05-08 DIAGNOSIS — L570 Actinic keratosis: Secondary | ICD-10-CM | POA: Diagnosis not present

## 2016-05-08 DIAGNOSIS — Z85828 Personal history of other malignant neoplasm of skin: Secondary | ICD-10-CM | POA: Diagnosis not present

## 2016-06-26 ENCOUNTER — Telehealth: Payer: Self-pay | Admitting: Internal Medicine

## 2016-06-26 NOTE — Telephone Encounter (Signed)
Called Mr. Germani to schedule awv appt. Left msg for pt to call office to schedule appt.

## 2016-08-07 ENCOUNTER — Ambulatory Visit (INDEPENDENT_AMBULATORY_CARE_PROVIDER_SITE_OTHER): Payer: Medicare Other | Admitting: Internal Medicine

## 2016-08-07 ENCOUNTER — Encounter: Payer: Self-pay | Admitting: Internal Medicine

## 2016-08-07 DIAGNOSIS — Z23 Encounter for immunization: Secondary | ICD-10-CM

## 2016-08-07 MED ORDER — METFORMIN HCL 1000 MG PO TABS: 1000.0000 mg | ORAL_TABLET | Freq: Two times a day (BID) | ORAL | 3 refills | Status: DC

## 2016-08-07 MED ORDER — BENAZEPRIL HCL 40 MG PO TABS: 40.0000 mg | ORAL_TABLET | Freq: Every day | ORAL | 3 refills | Status: DC

## 2016-08-07 MED ORDER — GLIMEPIRIDE 2 MG PO TABS: 2.0000 mg | ORAL_TABLET | Freq: Every day | ORAL | 3 refills | Status: DC

## 2016-08-07 MED ORDER — TRIAMTERENE-HCTZ 37.5-25 MG PO TABS: 1.0000 | ORAL_TABLET | Freq: Every day | ORAL | 3 refills | Status: DC

## 2016-08-07 MED ORDER — SIMVASTATIN 40 MG PO TABS: 40.0000 mg | ORAL_TABLET | Freq: Every day | ORAL | 3 refills | Status: DC

## 2016-08-07 NOTE — Progress Notes (Signed)
Pre visit review using our clinic review tool, if applicable. No additional management support is needed unless otherwise documented below in the visit note. 

## 2016-08-07 NOTE — Progress Notes (Signed)
Subjective:  Patient ID: Larry [name], male    DOB: [birth year]  Age: 75 y.o. MRN: [id number]  CC: No chief complaint on file.   HPI Larry [name] presents for HTN, DM, dyslipidemia f/u  Outpatient Medications Prior to Visit  Medication Sig Dispense Refill  . ACCU-CHEK FASTCLIX LANCETS MISC 1 each by Does not apply route 2 (two) times daily. Dx:E11.9 100 each 5  . benazepril (LOTENSIN) 40 MG tablet Take 1 tablet (40 mg total) by mouth daily. 90 tablet 3  . Cholecalciferol (VITAMIN D3) 1000 UNITS CAPS Take by mouth daily.     . Ginkgo Biloba 40 MG TABS Take by mouth daily.    [name] glimepiride (AMARYL) 2 MG tablet Take 1 tablet (2 mg total) by mouth daily with breakfast. 90 tablet 3  . glucose blood (ACCU-CHEK SMARTVIEW) test strip 1 each by Other route 2 (two) times daily. Use to check blood sugars twice a day Dx E11.9 100 each 3  . glucose blood test strip 1 each by Other route 2 (two) times daily. Please dispense brand of choice per pt and insurance preference. Dx:250.00 100 each 5  . glucose monitoring kit (FREESTYLE) monitoring kit 1 each by Does not apply route as needed for other. Please dispense brand of choice per ins/patient. Dx: 250.00. 1 each 0  . metFORMIN (GLUCOPHAGE) 1000 MG tablet Take 1 tablet (1,000 mg total) by mouth 2 (two) times daily with a meal. 180 tablet 3  . Multiple Vitamins-Minerals (ICAPS) CAPS Take 1 capsule by mouth daily.      . Omega-3 Fatty Acids (OMEGA 3 PO) Take by mouth daily.    . simvastatin (ZOCOR) 40 MG tablet Take 1 tablet (40 mg total) by mouth at bedtime. 90 tablet 3  . triamterene-hydrochlorothiazide (MAXZIDE-25) 37.5-25 MG tablet Take 1 tablet by mouth daily. 90 tablet 3  . vitamin C (ASCORBIC ACID) 500 MG tablet Take 1 tablet (500 mg total) by mouth daily. (Patient taking differently: Take 1,000 mg by mouth daily. 1000 mg) 100 tablet 3   Facility-Administered Medications Prior to Visit  Medication Dose Route Frequency Provider Last Rate Last  Dose  . pneumococcal 23 valent vaccine (PNU-IMMUNE) injection 0.5 mL  0.5 mL Intramuscular Once Cassandria Anger, MD        ROS Review of Systems  Constitutional: Negative for appetite change, fatigue and unexpected weight change.  HENT: Negative for congestion, nosebleeds, sneezing, sore throat and trouble swallowing.   Eyes: Negative for itching and visual disturbance.  Respiratory: Negative for cough.   Cardiovascular: Negative for chest pain, palpitations and leg swelling.  Gastrointestinal: Negative for abdominal distention, blood in stool, diarrhea and nausea.  Genitourinary: Negative for frequency and hematuria.  Musculoskeletal: Negative for back pain, gait problem, joint swelling and neck pain.  Skin: Negative for rash.  Neurological: Negative for dizziness, tremors, speech difficulty and weakness.  Psychiatric/Behavioral: Negative for agitation, dysphoric mood and sleep disturbance. The patient is not nervous/anxious.     Objective:  There were no vitals taken for this visit.  BP Readings from Last 3 Encounters:  04/06/16 (!) 162/60  03/23/16 (!) 170/78  03/14/16 (!) 132/59    Wt Readings from Last 3 Encounters:  04/06/16 235 lb 8 oz (106.8 kg)  04/04/16 240 lb (108.9 kg)  03/23/16 242 lb (109.8 kg)    Physical Exam  Constitutional: He is oriented to person, place, and time. He appears well-developed. No distress.  NAD  HENT:  Mouth/Throat: Oropharynx is  clear and moist.  Eyes: Conjunctivae are normal. Pupils are equal, round, and reactive to light.  Neck: Normal range of motion. No JVD present. No thyromegaly present.  Cardiovascular: Normal rate, regular rhythm, normal heart sounds and intact distal pulses.  Exam reveals no gallop and no friction rub.   No murmur heard. Pulmonary/Chest: Effort normal and breath sounds normal. No respiratory distress. He has no wheezes. He has no rales. He exhibits no tenderness.  Abdominal: Soft. Bowel sounds are normal. He  exhibits no distension and no mass. There is no tenderness. There is no rebound and no guarding.  Musculoskeletal: Normal range of motion. He exhibits no edema or tenderness.  Lymphadenopathy:    He has no cervical adenopathy.  Neurological: He is alert and oriented to person, place, and time. He has normal reflexes. No cranial nerve deficit. He exhibits normal muscle tone. He displays a negative Romberg sign. Coordination and gait normal.  Skin: Skin is warm and dry. No rash noted.  Psychiatric: He has a normal mood and affect. His behavior is normal. Judgment and thought content normal.  Obese  Lab Results  Component Value Date   WBC 10.3 07/13/2011   HGB 14.2 07/13/2011   HCT 42.9 07/13/2011   PLT 286.0 07/13/2011   GLUCOSE 135 (H) 01/09/2016   CHOL 128 04/07/2014   TRIG 135.0 04/07/2014   HDL 38.30 (L) 04/07/2014   LDLCALC 63 04/07/2014   ALT 48 04/23/2012   AST 42 (H) 04/23/2012   NA 139 01/09/2016   K 4.7 01/09/2016   CL 105 01/09/2016   CREATININE 1.12 01/09/2016   BUN 18 01/09/2016   CO2 27 01/09/2016   TSH 2.12 12/17/2014   PSA 0.00 Repeated and verified X2. (L) 12/26/2012   HGBA1C 7.3 (H) 01/09/2016    Mr Lumbar Spine Wo Contrast  Result Date: 04/04/2016 CLINICAL DATA:  Sciatic radiculitis. Right leg pain. Prostate cancer. EXAM: MRI LUMBAR SPINE WITHOUT CONTRAST TECHNIQUE: Multiplanar, multisequence MR imaging of the lumbar spine was performed. No intravenous contrast was administered. COMPARISON:  None. 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 Iliolumbar ligament attached to transverse process of L5. Alignment:  Normal Vertebrae: Negative for fracture or mass lesion. Normal bone marrow Conus medullaris: Extends to the mid L1 level and appears normal. Paraspinal and other soft tissues: Paraspinous soft tissues normal. No retroperitoneal mass or adenopathy. Disc levels: T12-L1: Central disc protrusion. Sagittal  images only through this level L1-2:  Negative L2-3: Disc degeneration with diffuse disc bulging. Small central disc protrusion. No significant spinal or foraminal stenosis L3-4: Disc degeneration and disc bulging. Small broad-based central disc protrusion. Bilateral facet hypertrophy with mild spinal stenosis. Mild foraminal stenosis bilaterally L4-5: Disc degeneration with disc bulging. Small central disc protrusion. Mild facet and ligamentum flavum hypertrophy causing mild spinal stenosis. 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 Electronically Signed   By: Franchot Gallo M.D.   On: 04/04/2016 08:11    Assessment & Plan:   There are no diagnoses linked to this encounter. I am  having Mr. Larusso maintain his ICAPS, Vitamin D3, Ginkgo Biloba, Omega-3 Fatty Acids (OMEGA 3 PO), glucose blood, glucose monitoring kit, vitamin C, glucose blood, metFORMIN, simvastatin, benazepril, triamterene-hydrochlorothiazide, ACCU-CHEK FASTCLIX LANCETS, and glimepiride. We will continue to administer pneumococcal 23 valent vaccine.  No orders of the defined types were placed in this encounter.    Follow-up: No Follow-up on file.  Walker Kehr, MD

## 2016-08-08 ENCOUNTER — Other Ambulatory Visit (INDEPENDENT_AMBULATORY_CARE_PROVIDER_SITE_OTHER): Payer: Medicare Other

## 2016-08-08 DIAGNOSIS — E118 Type 2 diabetes mellitus with unspecified complications: Secondary | ICD-10-CM

## 2016-08-08 LAB — BASIC METABOLIC PANEL
BUN: 24 mg/dL — ABNORMAL HIGH (ref 6–23)
CALCIUM: 9.6 mg/dL (ref 8.4–10.5)
CO2: 29 mEq/L (ref 19–32)
Chloride: 103 mEq/L (ref 96–112)
Creatinine, Ser: 1.3 mg/dL (ref 0.40–1.50)
GFR: 57.24 mL/min — AB (ref >60.00)
GLUCOSE: 228 mg/dL — AB (ref 70–99)
Potassium: 4.5 mEq/L (ref 3.5–5.1)
SODIUM: 140 meq/L (ref 135–145)

## 2016-08-08 LAB — HEMOGLOBIN A1C: Hgb A1c MFr Bld: 7 % — ABNORMAL HIGH (ref 4.6–6.5)

## 2016-11-06 DIAGNOSIS — Z08 Encounter for follow-up examination after completed treatment for malignant neoplasm: Secondary | ICD-10-CM | POA: Diagnosis not present

## 2016-11-06 DIAGNOSIS — L570 Actinic keratosis: Secondary | ICD-10-CM | POA: Diagnosis not present

## 2016-11-06 DIAGNOSIS — Z85828 Personal history of other malignant neoplasm of skin: Secondary | ICD-10-CM | POA: Diagnosis not present

## 2016-11-14 DIAGNOSIS — Z888 Allergy status to other drugs, medicaments and biological substances status: Secondary | ICD-10-CM | POA: Diagnosis not present

## 2016-11-14 DIAGNOSIS — Z8546 Personal history of malignant neoplasm of prostate: Secondary | ICD-10-CM | POA: Diagnosis not present

## 2016-11-14 DIAGNOSIS — Z7984 Long term (current) use of oral hypoglycemic drugs: Secondary | ICD-10-CM | POA: Diagnosis not present

## 2016-11-14 DIAGNOSIS — E119 Type 2 diabetes mellitus without complications: Secondary | ICD-10-CM | POA: Diagnosis not present

## 2016-11-14 DIAGNOSIS — G319 Degenerative disease of nervous system, unspecified: Secondary | ICD-10-CM | POA: Diagnosis not present

## 2016-11-14 DIAGNOSIS — Z79899 Other long term (current) drug therapy: Secondary | ICD-10-CM | POA: Diagnosis not present

## 2016-11-14 DIAGNOSIS — R51 Headache: Secondary | ICD-10-CM | POA: Diagnosis not present

## 2016-11-14 DIAGNOSIS — I10 Essential (primary) hypertension: Secondary | ICD-10-CM | POA: Diagnosis not present

## 2016-11-14 DIAGNOSIS — Z883 Allergy status to other anti-infective agents status: Secondary | ICD-10-CM | POA: Diagnosis not present

## 2016-11-14 DIAGNOSIS — R112 Nausea with vomiting, unspecified: Secondary | ICD-10-CM | POA: Diagnosis not present

## 2016-11-14 DIAGNOSIS — I4510 Unspecified right bundle-branch block: Secondary | ICD-10-CM | POA: Diagnosis not present

## 2016-11-14 DIAGNOSIS — R079 Chest pain, unspecified: Secondary | ICD-10-CM | POA: Diagnosis not present

## 2016-11-21 ENCOUNTER — Encounter: Payer: Self-pay | Admitting: Internal Medicine

## 2016-11-21 ENCOUNTER — Ambulatory Visit (INDEPENDENT_AMBULATORY_CARE_PROVIDER_SITE_OTHER): Payer: Medicare Other | Admitting: Internal Medicine

## 2016-11-21 DIAGNOSIS — G43109 Migraine with aura, not intractable, without status migrainosus: Secondary | ICD-10-CM | POA: Insufficient documentation

## 2016-11-21 DIAGNOSIS — G9340 Encephalopathy, unspecified: Secondary | ICD-10-CM | POA: Insufficient documentation

## 2016-11-21 DIAGNOSIS — I10 Essential (primary) hypertension: Secondary | ICD-10-CM

## 2016-11-21 DIAGNOSIS — E118 Type 2 diabetes mellitus with unspecified complications: Secondary | ICD-10-CM

## 2016-11-21 MED ORDER — RIZATRIPTAN BENZOATE 10 MG PO TABS: 10.0000 mg | ORAL_TABLET | Freq: Once | ORAL | 3 refills | Status: DC | PRN

## 2016-11-21 MED ORDER — VALSARTAN 320 MG PO TABS: 320.0000 mg | ORAL_TABLET | Freq: Every day | ORAL | 11 refills | Status: DC

## 2016-11-21 MED ORDER — GLIMEPIRIDE 2 MG PO TABS: 2.0000 mg | ORAL_TABLET | Freq: Two times a day (BID) | ORAL | 3 refills | Status: DC

## 2016-11-21 MED ORDER — GLUCOSE BLOOD VI STRP: 1.0000 | ORAL_STRIP | Freq: Two times a day (BID) | 3 refills | Status: DC

## 2016-11-21 NOTE — Assessment & Plan Note (Addendum)
CBG 112 today Increase Glimepiride to bid

## 2016-11-21 NOTE — Progress Notes (Signed)
Subjective:  Patient ID: Larry [name], male    DOB: 06/30/1941  Age: 76 y.o. MRN: [id number]  CC: Headache (Pt stated having headache, blurr vision, vomiting, for about 5 weeks. Pt went to ER 11/14/2016)   HPI Larry [name] presents for ER f/u for n/v x few hours on 3/28 - went to [facility] ER. C/o occ HAs w/blurred vision. Labs, head CT were ok F/u DM, HTN  Outpatient Medications Prior to Visit  Medication Sig Dispense Refill  . ACCU-CHEK FASTCLIX LANCETS MISC 1 each by Does not apply route 2 (two) times daily. Dx:E11.9 100 each 5  . benazepril (LOTENSIN) 40 MG tablet Take 1 tablet (40 mg total) by mouth daily. 90 tablet 3  . Cholecalciferol (VITAMIN D3) 1000 UNITS CAPS Take by mouth daily.     . Ginkgo Biloba 40 MG TABS Take by mouth daily.    [name] glimepiride (AMARYL) 2 MG tablet Take 1 tablet (2 mg total) by mouth daily with breakfast. 90 tablet 3  . glucose blood (ACCU-CHEK SMARTVIEW) test strip 1 each by Other route 2 (two) times daily. Use to check blood sugars twice a day Dx E11.9 100 each 3  . glucose blood test strip 1 each by Other route 2 (two) times daily. Please dispense brand of choice per pt and insurance preference. Dx:250.00 100 each 5  . glucose monitoring kit (FREESTYLE) monitoring kit 1 each by Does not apply route as needed for other. Please dispense brand of choice per ins/patient. Dx: 250.00. 1 each 0  . metFORMIN (GLUCOPHAGE) 1000 MG tablet Take 1 tablet (1,000 mg total) by mouth 2 (two) times daily with a meal. 180 tablet 3  . Multiple Vitamins-Minerals (ICAPS) CAPS Take 1 capsule by mouth daily.      . Omega-3 Fatty Acids (OMEGA 3 PO) Take by mouth daily.    . simvastatin (ZOCOR) 40 MG tablet Take 1 tablet (40 mg total) by mouth at bedtime. 90 tablet 3  . triamterene-hydrochlorothiazide (MAXZIDE-25) 37.5-25 MG tablet Take 1 tablet by mouth daily. 90 tablet 3  . vitamin C (ASCORBIC ACID) 500 MG tablet Take 1 tablet (500 mg total) by mouth daily. (Patient taking  differently: Take 1,000 mg by mouth daily. 1000 mg) 100 tablet 3   Facility-Administered Medications Prior to Visit  Medication Dose Route Frequency Provider Last Rate Last Dose  . pneumococcal 23 valent vaccine (PNU-IMMUNE) injection 0.5 mL  0.5 mL Intramuscular Once Filip Luten V, MD        ROS Review of Systems  Constitutional: Negative for appetite change, fatigue and unexpected weight change.  HENT: Negative for congestion, nosebleeds, sneezing, sore throat and trouble swallowing.   Eyes: Negative for itching and visual disturbance.  Respiratory: Negative for cough.   Cardiovascular: Negative for chest pain, palpitations and leg swelling.  Gastrointestinal: Negative for abdominal distention, blood in stool, diarrhea and nausea.  Genitourinary: Negative for frequency and hematuria.  Musculoskeletal: Negative for back pain, gait problem, joint swelling and neck pain.  Skin: Negative for rash.  Neurological: Positive for headaches. Negative for dizziness, tremors, speech difficulty and weakness.  Psychiatric/Behavioral: Negative for agitation, dysphoric mood, sleep disturbance and suicidal ideas. The patient is not nervous/anxious.     Objective:  BP (!) 146/86   Pulse 86   Temp 99 F (37.2 C)   Ht 6' (1.829 m)   Wt 249 lb (112.9 kg)   SpO2 99%   BMI 33.77 kg/m   BP Readings from Last 3 Encounters:  11/21/16 (!) 146/86  08/07/16 (!) 142/80  04/06/16 (!) 162/60    Wt Readings from Last 3 Encounters:  11/21/16 249 lb (112.9 kg)  08/07/16 246 lb 8 oz (111.8 kg)  04/06/16 235 lb 8 oz (106.8 kg)    Physical Exam  Constitutional: He is oriented to person, place, and time. He appears well-developed. No distress.  NAD  HENT:  Mouth/Throat: Oropharynx is clear and moist.  Eyes: Conjunctivae are normal. Pupils are equal, round, and reactive to light.  Neck: Normal range of motion. No JVD present. No thyromegaly present.  Cardiovascular: Normal rate, regular rhythm,  normal heart sounds and intact distal pulses.  Exam reveals no gallop and no friction rub.   No murmur heard. Pulmonary/Chest: Effort normal and breath sounds normal. No respiratory distress. He has no wheezes. He has no rales. He exhibits no tenderness.  Abdominal: Soft. Bowel sounds are normal. He exhibits no distension and no mass. There is no tenderness. There is no rebound and no guarding.  Musculoskeletal: Normal range of motion. He exhibits no edema or tenderness.  Lymphadenopathy:    He has no cervical adenopathy.  Neurological: He is alert and oriented to person, place, and time. He has normal reflexes. No cranial nerve deficit. He exhibits normal muscle tone. He displays a negative Romberg sign. Coordination and gait normal.  Skin: Skin is warm and dry. No rash noted.  Psychiatric: He has a normal mood and affect. His behavior is normal. Judgment and thought content normal.    Lab Results  Component Value Date   WBC 10.3 07/13/2011   HGB 14.2 07/13/2011   HCT 42.9 07/13/2011   PLT 286.0 07/13/2011   GLUCOSE 228 (H) 08/08/2016   CHOL 128 04/07/2014   TRIG 135.0 04/07/2014   HDL 38.30 (L) 04/07/2014   LDLCALC 63 04/07/2014   ALT 48 04/23/2012   AST 42 (H) 04/23/2012   NA 140 08/08/2016   K 4.5 08/08/2016   CL 103 08/08/2016   CREATININE 1.30 08/08/2016   BUN 24 (H) 08/08/2016   CO2 29 08/08/2016   TSH 2.12 12/17/2014   PSA 0.00 Repeated and verified X2. (L) 12/26/2012   HGBA1C 7.0 (H) 08/08/2016    Mr Lumbar Spine Wo Contrast  Result Date: 04/04/2016 CLINICAL DATA:  Sciatic radiculitis. Right leg pain. Prostate cancer. EXAM: MRI LUMBAR SPINE WITHOUT CONTRAST TECHNIQUE: Multiplanar, multisequence MR imaging of the lumbar spine was performed. No intravenous contrast was administered. COMPARISON:  None. 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 Iliolumbar ligament attached to transverse process of L5. Alignment:   Normal Vertebrae: Negative for fracture or mass lesion. Normal bone marrow Conus medullaris: Extends to the mid L1 level and appears normal. Paraspinal and other soft tissues: Paraspinous soft tissues normal. No retroperitoneal mass or adenopathy. Disc levels: T12-L1: Central disc protrusion. Sagittal images only through this level L1-2:  Negative L2-3: Disc degeneration with diffuse disc bulging. Small central disc protrusion. No significant spinal or foraminal stenosis L3-4: Disc degeneration and disc bulging. Small broad-based central disc protrusion. Bilateral facet hypertrophy with mild spinal stenosis. Mild foraminal stenosis bilaterally L4-5: Disc degeneration with disc bulging. Small central disc protrusion. Mild facet and ligamentum flavum hypertrophy causing mild spinal stenosis. 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 Electronically Signed   By: Franchot Gallo M.D.   On: 04/04/2016 08:11    Assessment & Plan:   There are no diagnoses linked to this encounter. I am having Larry [name] maintain his ICAPS, Vitamin D3, Ginkgo Biloba, Omega-3 Fatty Acids (OMEGA 3 PO), glucose blood, glucose monitoring kit, vitamin C, glucose blood, ACCU-CHEK FASTCLIX LANCETS, benazepril, glimepiride, metFORMIN, simvastatin, and triamterene-hydrochlorothiazide. We will continue to administer pneumococcal 23 valent vaccine.  No orders of the defined types were placed in this encounter.    Follow-up: No Follow-up on  file.  Walker Kehr, MD

## 2016-11-21 NOTE — Assessment & Plan Note (Signed)
Diovan start, Triamt/HCT D/c Lotensin

## 2016-11-21 NOTE — Assessment & Plan Note (Signed)
Recurrent - weather change is a trigger Maxalt prn

## 2016-12-04 ENCOUNTER — Telehealth: Payer: Self-pay | Admitting: Internal Medicine

## 2016-12-04 NOTE — Telephone Encounter (Signed)
Called patient regarding awv. Patient will give office a call back to schedule appt.

## 2016-12-06 ENCOUNTER — Ambulatory Visit: Payer: Medicare Other | Admitting: Internal Medicine

## 2017-02-05 DIAGNOSIS — R972 Elevated prostate specific antigen [PSA]: Secondary | ICD-10-CM | POA: Diagnosis not present

## 2017-02-06 DIAGNOSIS — H353132 Nonexudative age-related macular degeneration, bilateral, intermediate dry stage: Secondary | ICD-10-CM | POA: Diagnosis not present

## 2017-02-06 DIAGNOSIS — E119 Type 2 diabetes mellitus without complications: Secondary | ICD-10-CM | POA: Diagnosis not present

## 2017-02-06 DIAGNOSIS — H35373 Puckering of macula, bilateral: Secondary | ICD-10-CM | POA: Diagnosis not present

## 2017-02-06 DIAGNOSIS — H40013 Open angle with borderline findings, low risk, bilateral: Secondary | ICD-10-CM | POA: Diagnosis not present

## 2017-02-06 LAB — HM DIABETES EYE EXAM

## 2017-02-12 DIAGNOSIS — N5231 Erectile dysfunction following radical prostatectomy: Secondary | ICD-10-CM | POA: Diagnosis not present

## 2017-02-12 DIAGNOSIS — Z8546 Personal history of malignant neoplasm of prostate: Secondary | ICD-10-CM | POA: Diagnosis not present

## 2017-02-12 DIAGNOSIS — N393 Stress incontinence (female) (male): Secondary | ICD-10-CM | POA: Diagnosis not present

## 2017-02-22 ENCOUNTER — Encounter: Payer: Self-pay | Admitting: Internal Medicine

## 2017-02-22 NOTE — Progress Notes (Signed)
Result Abstracted 

## 2017-02-27 DIAGNOSIS — N393 Stress incontinence (female) (male): Secondary | ICD-10-CM | POA: Diagnosis not present

## 2017-02-27 DIAGNOSIS — R278 Other lack of coordination: Secondary | ICD-10-CM | POA: Diagnosis not present

## 2017-02-27 DIAGNOSIS — M6281 Muscle weakness (generalized): Secondary | ICD-10-CM | POA: Diagnosis not present

## 2017-03-01 ENCOUNTER — Ambulatory Visit: Payer: Medicare Other | Admitting: Internal Medicine

## 2017-03-12 ENCOUNTER — Telehealth: Payer: Self-pay

## 2017-03-12 MED ORDER — TELMISARTAN 80 MG PO TABS: 80.0000 mg | ORAL_TABLET | Freq: Every day | ORAL | 11 refills | Status: DC

## 2017-03-12 NOTE — Telephone Encounter (Signed)
Patients pharmacy is contacting about valsartan recall, is there another medication we can send in. Please advise thanks

## 2017-03-12 NOTE — Telephone Encounter (Signed)
Micardis Rx - done Thx

## 2017-03-18 ENCOUNTER — Telehealth: Payer: Self-pay | Admitting: Internal Medicine

## 2017-03-18 MED ORDER — TELMISARTAN 80 MG PO TABS: 80.0000 mg | ORAL_TABLET | Freq: Every day | ORAL | 11 refills | Status: DC

## 2017-03-18 NOTE — Telephone Encounter (Signed)
Patient states [facility] in [place] states does not have script for micardis.  Is requesting to be resent.

## 2017-03-18 NOTE — Telephone Encounter (Signed)
Not sure why per chart received confirmation (see below), but will resend to [facility].../lmb telmisartan (MICARDIS) 80 MG tablet 30 tablet 11 03/12/2017    Sig - Route: Take 1 tablet (80 mg total) by mouth daily. - Oral   Sent to pharmacy as: telmisartan (MICARDIS) 80 MG tablet   E-Prescribing Status: Receipt confirmed by pharmacy (03/12/2017 5:30 PM EDT)

## 2017-04-05 MED ORDER — IRBESARTAN 300 MG PO TABS: 300.0000 mg | ORAL_TABLET | Freq: Every day | ORAL | 3 refills | Status: DC

## 2017-04-05 NOTE — Telephone Encounter (Signed)
Received fax from [facility] that PA for telmisartan has been denied.  Is there an alternative to be rx'ed.

## 2017-04-05 NOTE — Telephone Encounter (Signed)
Irbesartan 300 mg/d #90 3 ref Thx

## 2017-04-05 NOTE — Telephone Encounter (Signed)
erx sent and pt informed of same.

## 2017-04-05 NOTE — Addendum Note (Signed)
Addended by: Aviva Signs M on: 04/05/2017 02:46 PM   Modules accepted: Orders

## 2017-06-18 ENCOUNTER — Encounter: Payer: Self-pay | Admitting: Internal Medicine

## 2017-06-18 ENCOUNTER — Other Ambulatory Visit (INDEPENDENT_AMBULATORY_CARE_PROVIDER_SITE_OTHER): Payer: Medicare Other

## 2017-06-18 ENCOUNTER — Ambulatory Visit (INDEPENDENT_AMBULATORY_CARE_PROVIDER_SITE_OTHER): Payer: Medicare Other | Admitting: Internal Medicine

## 2017-06-18 ENCOUNTER — Ambulatory Visit (INDEPENDENT_AMBULATORY_CARE_PROVIDER_SITE_OTHER)
Admission: RE | Admit: 2017-06-18 | Discharge: 2017-06-18 | Disposition: A | Discharge status: Home / Self Care | Payer: Medicare Other | Source: Ambulatory Visit | Attending: Internal Medicine | Admitting: Internal Medicine

## 2017-06-18 VITALS — BP 136/82 | HR 71 | Temp 98.4°F | Ht 72.0 in | Wt 255.0 lb

## 2017-06-18 DIAGNOSIS — R0609 Other forms of dyspnea: Secondary | ICD-10-CM

## 2017-06-18 DIAGNOSIS — R600 Localized edema: Secondary | ICD-10-CM | POA: Diagnosis not present

## 2017-06-18 DIAGNOSIS — E118 Type 2 diabetes mellitus with unspecified complications: Secondary | ICD-10-CM

## 2017-06-18 DIAGNOSIS — E785 Hyperlipidemia, unspecified: Secondary | ICD-10-CM

## 2017-06-18 DIAGNOSIS — R0789 Other chest pain: Secondary | ICD-10-CM | POA: Insufficient documentation

## 2017-06-18 DIAGNOSIS — R0602 Shortness of breath: Secondary | ICD-10-CM | POA: Diagnosis not present

## 2017-06-18 DIAGNOSIS — Z23 Encounter for immunization: Secondary | ICD-10-CM

## 2017-06-18 DIAGNOSIS — G4733 Obstructive sleep apnea (adult) (pediatric): Secondary | ICD-10-CM | POA: Diagnosis not present

## 2017-06-18 LAB — BASIC METABOLIC PANEL
BUN: 21 mg/dL (ref 6–23)
CALCIUM: 9.5 mg/dL (ref 8.4–10.5)
CO2: 29 mEq/L (ref 19–32)
CREATININE: 1.21 mg/dL (ref 0.40–1.50)
Chloride: 104 mEq/L (ref 96–112)
GFR: 62.03 mL/min (ref >60.00)
Glucose, Bld: 178 mg/dL — ABNORMAL HIGH (ref 70–99)
Potassium: 4.7 mEq/L (ref 3.5–5.1)
SODIUM: 141 meq/L (ref 135–145)

## 2017-06-18 LAB — HEPATIC FUNCTION PANEL
ALK PHOS: 70 U/L (ref 39–117)
ALT: 17 U/L (ref 0–53)
AST: 15 U/L (ref 0–37)
Albumin: 4 g/dL (ref 3.5–5.2)
BILIRUBIN DIRECT: 0.2 mg/dL (ref 0.0–0.3)
TOTAL PROTEIN: 6.3 g/dL (ref 6.0–8.3)
Total Bilirubin: 0.7 mg/dL (ref 0.2–1.2)

## 2017-06-18 LAB — CBC WITH DIFFERENTIAL/PLATELET
BASOS ABS: 0.1 10*3/uL (ref 0.0–0.1)
Basophils Relative: 1 % (ref 0.0–3.0)
EOS ABS: 0.3 10*3/uL (ref 0.0–0.7)
Eosinophils Relative: 2.4 % (ref 0.0–5.0)
HEMATOCRIT: 42.9 % (ref 39.0–52.0)
Hemoglobin: 14.2 g/dL (ref 13.0–17.0)
LYMPHS PCT: 34 % (ref 12.0–46.0)
Lymphs Abs: 3.6 10*3/uL (ref 0.7–4.0)
MCHC: 33 g/dL (ref 30.0–36.0)
MCV: 91.5 fl (ref 78.0–100.0)
MONOS PCT: 6.9 % (ref 3.0–12.0)
Monocytes Absolute: 0.7 10*3/uL (ref 0.1–1.0)
NEUTROS PCT: 55.7 % (ref 43.0–77.0)
Neutro Abs: 5.9 10*3/uL (ref 1.4–7.7)
Platelets: 277 10*3/uL (ref 150.0–400.0)
RBC: 4.69 Mil/uL (ref 4.22–5.81)
RDW: 13.1 % (ref 11.5–15.5)
WBC: 10.6 10*3/uL — AB (ref 4.0–10.5)

## 2017-06-18 LAB — LIPID PANEL
CHOLESTEROL: 128 mg/dL (ref 0–200)
HDL: 40.4 mg/dL (ref >39.00)
LDL CALC: 66 mg/dL (ref 0–99)
NonHDL: 87.48
TRIGLYCERIDES: 105 mg/dL (ref 0.0–149.0)
Total CHOL/HDL Ratio: 3
VLDL: 21 mg/dL (ref 0.0–40.0)

## 2017-06-18 LAB — URINALYSIS
Bilirubin Urine: NEGATIVE
Hgb urine dipstick: NEGATIVE
LEUKOCYTES UA: NEGATIVE
Nitrite: NEGATIVE
PH: 5.5 (ref 5.0–8.0)
Total Protein, Urine: NEGATIVE
UROBILINOGEN UA: 0.2 (ref 0.0–1.0)
Urine Glucose: NEGATIVE

## 2017-06-18 LAB — HEMOGLOBIN A1C: Hgb A1c MFr Bld: 7.3 % — ABNORMAL HIGH (ref 4.6–6.5)

## 2017-06-18 LAB — BRAIN NATRIURETIC PEPTIDE: Pro B Natriuretic peptide (BNP): 323 pg/mL — ABNORMAL HIGH (ref 0.0–100.0)

## 2017-06-18 LAB — TSH: TSH: 1.9 u[IU]/mL (ref 0.35–4.50)

## 2017-06-18 MED ORDER — FUROSEMIDE 40 MG PO TABS: 40.0000 mg | ORAL_TABLET | Freq: Every day | ORAL | 1 refills | Status: DC | PRN

## 2017-06-18 MED ORDER — NITROGLYCERIN 0.4 MG SL SUBL: 0.4000 mg | SUBLINGUAL_TABLET | SUBLINGUAL | 3 refills | Status: DC | PRN

## 2017-06-18 NOTE — Assessment & Plan Note (Signed)
On Simvastatin 

## 2017-06-18 NOTE — Assessment & Plan Note (Signed)
New. Card ref ASAP To ER if reoccurred  EKG

## 2017-06-18 NOTE — Addendum Note (Signed)
Addended by: Karren Cobble on: 06/18/2017 09:31 AM   Modules accepted: Orders

## 2017-06-18 NOTE — Patient Instructions (Addendum)
Go to ER if worse! Cut back on salt!

## 2017-06-18 NOTE — Progress Notes (Signed)
Subjective:  Patient ID: Larry [name], male    DOB: 1941-08-06  Age: 76 y.o. MRN: [id number]  CC: No chief complaint on file.   HPI Larry [name] presents for SOB and CP w/exertion x  3 weeks. Sx's would resolve w/rest quickly. No orthopnea. No LOC. Sx's relieved w/rest F/u DM, obesity. CBGs in 170 range. Eating more UTZ chips, pretzels in the last month...  Outpatient Medications Prior to Visit  Medication Sig Dispense Refill  . ACCU-CHEK FASTCLIX LANCETS MISC 1 each by Does not apply route 2 (two) times daily. Dx:E11.9 100 each 5  . Cholecalciferol (VITAMIN D3) 1000 UNITS CAPS Take by mouth daily.     [name] glimepiride (AMARYL) 2 MG tablet Take 1 tablet (2 mg total) by mouth 2 (two) times daily. 180 tablet 3  . glucose blood (ACCU-CHEK SMARTVIEW) test strip 1 each by Other route 2 (two) times daily. Use to check blood sugars twice a day Dx E11.9 100 each 3  . glucose blood test strip 1 each by Other route 2 (two) times daily. Please dispense brand of choice per pt and insurance preference. Dx:250.00 100 each 5  . glucose monitoring kit (FREESTYLE) monitoring kit 1 each by Does not apply route as needed for other. Please dispense brand of choice per ins/patient. Dx: 250.00. 1 each 0  . metFORMIN (GLUCOPHAGE) 1000 MG tablet Take 1 tablet (1,000 mg total) by mouth 2 (two) times daily with a meal. 180 tablet 3  . simvastatin (ZOCOR) 40 MG tablet Take 1 tablet (40 mg total) by mouth at bedtime. 90 tablet 3  . vitamin C (ASCORBIC ACID) 500 MG tablet Take 1 tablet (500 mg total) by mouth daily. (Patient taking differently: Take 1,000 mg by mouth daily. 1000 mg) 100 tablet 3  . triamterene-hydrochlorothiazide (MAXZIDE-25) 37.5-25 MG tablet Take 1 tablet by mouth daily. (Patient not taking: Reported on 06/18/2017) 90 tablet 3  . Ginkgo Biloba 40 MG TABS Take by mouth daily.    . irbesartan (AVAPRO) 300 MG tablet Take 1 tablet (300 mg total) by mouth daily. 90 tablet 3  . Multiple  Vitamins-Minerals (ICAPS) CAPS Take 1 capsule by mouth daily.      . Omega-3 Fatty Acids (OMEGA 3 PO) Take by mouth daily.    . rizatriptan (MAXALT) 10 MG tablet Take 1 tablet (10 mg total) by mouth once as needed for migraine. May repeat in 2 hours if needed 12 tablet 3   Facility-Administered Medications Prior to Visit  Medication Dose Route Frequency Provider Last Rate Last Dose  . pneumococcal 23 valent vaccine (PNU-IMMUNE) injection 0.5 mL  0.5 mL Intramuscular Once Larry [name], Larry Lacks, MD        ROS Review of Systems  Constitutional: Negative for appetite change, fatigue and unexpected weight change.  HENT: Negative for congestion, nosebleeds, sneezing, sore throat and trouble swallowing.   Eyes: Negative for itching and visual disturbance.  Respiratory: Positive for chest tightness and shortness of breath. Negative for cough.   Cardiovascular: Positive for leg swelling. Negative for chest pain and palpitations.  Gastrointestinal: Negative for abdominal distention, blood in stool, diarrhea and nausea.  Genitourinary: Negative for frequency and hematuria.  Musculoskeletal: Negative for back pain, gait problem, joint swelling and neck pain.  Skin: Negative for rash.  Neurological: Negative for dizziness, tremors, speech difficulty and weakness.  Psychiatric/Behavioral: Negative for agitation, dysphoric mood and sleep disturbance. The patient is not nervous/anxious.     Objective:  BP 136/82 (BP Location: Left  Arm, Patient Position: Sitting, Cuff Size: Large)   Pulse 71   Temp 98.4 F (36.9 C) (Oral)   Ht 6' (1.829 m)   Wt 255 lb (115.7 kg)   SpO2 98%   BMI 34.58 kg/m   BP Readings from Last 3 Encounters:  06/18/17 136/82  11/21/16 (!) 146/86  08/07/16 (!) 142/80    Wt Readings from Last 3 Encounters:  06/18/17 255 lb (115.7 kg)  11/21/16 249 lb (112.9 kg)  08/07/16 246 lb 8 oz (111.8 kg)    Physical Exam  Constitutional: He is oriented to person, place, and time.  He appears well-developed. No distress.  NAD  HENT:  Mouth/Throat: Oropharynx is clear and moist.  Eyes: Pupils are equal, round, and reactive to light. Conjunctivae are normal.  Neck: Normal range of motion. No JVD present. No thyromegaly present.  Cardiovascular: Normal rate, regular rhythm, normal heart sounds and intact distal pulses.  Exam reveals no gallop and no friction rub.   No murmur heard. Pulmonary/Chest: Effort normal and breath sounds normal. No respiratory distress. He has no wheezes. He has no rales. He exhibits no tenderness.  Abdominal: Soft. Bowel sounds are normal. He exhibits no distension and no mass. There is no tenderness. There is no rebound and no guarding.  Musculoskeletal: Normal range of motion. He exhibits edema. He exhibits no tenderness.  Lymphadenopathy:    He has no cervical adenopathy.  Neurological: He is alert and oriented to person, place, and time. He has normal reflexes. No cranial nerve deficit. He exhibits normal muscle tone. He displays a negative Romberg sign. Coordination and gait normal.  Skin: Skin is warm and dry. No rash noted.  Psychiatric: He has a normal mood and affect. His behavior is normal. Judgment and thought content normal.  B 1 to 2+ edema Obese   Procedure: EKG Indication: chest pain, DOE Impression: NSR. RBBB  Lab Results  Component Value Date   WBC 10.3 07/13/2011   HGB 14.2 07/13/2011   HCT 42.9 07/13/2011   PLT 286.0 07/13/2011   GLUCOSE 228 (H) 08/08/2016   CHOL 128 04/07/2014   TRIG 135.0 04/07/2014   HDL 38.30 (L) 04/07/2014   LDLCALC 63 04/07/2014   ALT 48 04/23/2012   AST 42 (H) 04/23/2012   NA 140 08/08/2016   K 4.5 08/08/2016   CL 103 08/08/2016   CREATININE 1.30 08/08/2016   BUN 24 (H) 08/08/2016   CO2 29 08/08/2016   TSH 2.12 12/17/2014   PSA 0.00 Repeated and verified X2. (L) 12/26/2012   HGBA1C 7.0 (H) 08/08/2016    Mr Lumbar Spine Wo Contrast  Result Date: 04/04/2016 CLINICAL DATA:  Sciatic  radiculitis. Right leg pain. Prostate cancer. EXAM: MRI LUMBAR SPINE WITHOUT CONTRAST TECHNIQUE: Multiplanar, multisequence MR imaging of the lumbar spine was performed. No intravenous contrast was administered. COMPARISON:  None. 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 Iliolumbar ligament attached to transverse process of L5. Alignment:  Normal Vertebrae: Negative for fracture or mass lesion. Normal bone marrow Conus medullaris: Extends to the mid L1 level and appears normal. Paraspinal and other soft tissues: Paraspinous soft tissues normal. No retroperitoneal mass or adenopathy. Disc levels: T12-L1: Central disc protrusion. Sagittal images only through this level L1-2:  Negative L2-3: Disc degeneration with diffuse disc bulging. Small central disc protrusion. No significant spinal or foraminal stenosis L3-4: Disc degeneration and disc bulging. Small broad-based central disc protrusion. Bilateral facet hypertrophy with mild spinal stenosis.  Mild foraminal stenosis bilaterally L4-5: Disc degeneration with disc bulging. Small central disc protrusion. Mild facet and ligamentum flavum hypertrophy causing mild spinal stenosis. 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 Electronically Signed   By: Franchot Gallo M.D.   On: 04/04/2016 08:11    Assessment & Plan:   There are no diagnoses linked to this encounter. I have discontinued Mr. Gencarelli ICAPS, Ginkgo  Biloba, Omega-3 Fatty Acids (OMEGA 3 PO), rizatriptan, and irbesartan. I am also having him maintain his Vitamin D3, glucose blood, glucose monitoring kit, vitamin C, ACCU-CHEK FASTCLIX LANCETS, metFORMIN, simvastatin, triamterene-hydrochlorothiazide, glimepiride, glucose blood, and valsartan. We will continue to administer pneumococcal 23 valent vaccine.  Meds ordered this encounter  Medications  . valsartan (DIOVAN) 320 MG tablet    Sig: Take 320 mg by mouth daily.     Follow-up: No Follow-up on file.  Larry Kehr, MD

## 2017-06-18 NOTE — Assessment & Plan Note (Signed)
BP Readings from Last 3 Encounters:  06/18/17 136/82  11/21/16 (!) 146/86  08/07/16 (!) 142/80

## 2017-06-18 NOTE — Assessment & Plan Note (Signed)
Worse per pt Labs 

## 2017-06-18 NOTE — Assessment & Plan Note (Signed)
Chronic - worse today EKG Labs CXR

## 2017-06-18 NOTE — Assessment & Plan Note (Signed)
On CPAP. ?

## 2017-06-18 NOTE — Assessment & Plan Note (Signed)
R/o CAD, CHF EKG, CXR, Labs Card ref ASAP Furosemide po

## 2017-06-19 ENCOUNTER — Encounter: Payer: Self-pay | Admitting: Interventional Cardiology

## 2017-06-19 ENCOUNTER — Ambulatory Visit (INDEPENDENT_AMBULATORY_CARE_PROVIDER_SITE_OTHER): Payer: Medicare Other | Admitting: Interventional Cardiology

## 2017-06-19 VITALS — BP 162/76 | HR 87 | Ht 72.0 in | Wt 254.2 lb

## 2017-06-19 DIAGNOSIS — I5042 Chronic combined systolic (congestive) and diastolic (congestive) heart failure: Secondary | ICD-10-CM | POA: Diagnosis not present

## 2017-06-19 DIAGNOSIS — I209 Angina pectoris, unspecified: Secondary | ICD-10-CM

## 2017-06-19 DIAGNOSIS — I10 Essential (primary) hypertension: Secondary | ICD-10-CM | POA: Diagnosis not present

## 2017-06-19 DIAGNOSIS — E118 Type 2 diabetes mellitus with unspecified complications: Secondary | ICD-10-CM

## 2017-06-19 DIAGNOSIS — E785 Hyperlipidemia, unspecified: Secondary | ICD-10-CM | POA: Diagnosis not present

## 2017-06-19 MED ORDER — ASPIRIN EC 81 MG PO TBEC: 81.0000 mg | DELAYED_RELEASE_TABLET | Freq: Every day | ORAL | 3 refills | Status: DC

## 2017-06-19 NOTE — Patient Instructions (Signed)
Medication Instructions:  1) START Aspirin 81mg  once daily  Labwork: None  Testing/Procedures: Your physician has requested that you have a cardiac catheterization. Cardiac catheterization is used to diagnose and/or treat various heart conditions. Doctors may recommend this procedure for a number of different reasons. The most common reason is to evaluate chest pain. Chest pain can be a symptom of coronary artery disease (CAD), and cardiac catheterization can show whether plaque is narrowing or blocking your heart's arteries. This procedure is also used to evaluate the valves, as well as measure the blood flow and oxygen levels in different parts of your heart. For further information please visit [email or web address]. Please follow instruction sheet, as given.   Follow-Up: Your physician recommends that you schedule a follow-up appointment in: 2 weeks after catheterization with PA or NP.   Any Other Special Instructions Will Be Listed Below (If Applicable).    [facility] OFFICE [address], [place] [address] [place] [address] Dept: [phone] Loc: [place]  06/19/2017  You are scheduled for a Cardiac Catheterization on Monday, November 5 with Dr. Daneen Schick.  1. Please arrive at the [facility] (Main Entrance A) at [facility]: [address] [place], [place] [address] at 11:30 AM (two hours before your procedure to ensure your preparation). Free valet parking service is available.   Special note: Every effort is made to have your procedure done on time. Please understand that emergencies sometimes delay scheduled procedures.  2. Diet: Do not eat or drink anything after midnight prior to your procedure except sips of water to take medications.  3. Labs: Your labs will be performed at the hospital after you arrive for your procedure.  4. Medication instructions in  preparation for your procedure:  Do not take your Metformin the night before or the morning of your procedure.  Do not take your Glimepiride, Furosemide or Maxzide the morning of your procedure.    On the morning of your procedure, take your Aspirin and any morning medicines NOT listed above.  You may use sips of water.  5. Plan for one night stay--bring personal belongings. 6. Bring a current list of your medications and current insurance cards. 7. You MUST have a responsible person to drive you home. 8. Someone MUST be with you the first 24 hours after you arrive home or your discharge will be delayed. 9. Please wear clothes that are easy to get on and off and wear slip-on shoes.  Thank you for allowing [place] to care for you!   -- [facility]    If you need a refill on your cardiac medications before your next appointment, please call your pharmacy.

## 2017-06-19 NOTE — H&P (View-Only) (Signed)
Cardiology Office Note    Date:  06/19/2017   ID:  Larry [name], Larry [name] May 05, 1941, MRN [id number]  PCP:  Cassandria Anger, MD  Cardiologist: Sinclair Grooms, MD   Chief Complaint  Patient presents with  . Chest Pain    History of Present Illness:  Larry [name] is a 76 y.o. male who was referred by Dr. Amalia Greenhouse for evaluation of exertional chest pain.  He has a background history of diabetes mellitus type 2, obstructive sleep apnea, essential hypertension, and hyperlipidemia.  Pleasant non-smoking gentleman with family history of CAD (brother and father both deceased and both with prior CABG), diabetes type 2 greater than 10 years and hypertension with a 6-22-monthhistory of exertional fatigue and chest tightness.  Over the past 2 months he has noticed tightness and extreme dyspnea during sexual intercourse.  Over the past month he has begun decreasing pace of walking and activities to avoid having the discomfort.  Rest relieves the discomfort after 10-15 minutes.  He denies spontaneous episodes of discomfort.  Past Medical History:  Diagnosis Date  . Cataracts, bilateral    removed  . Hyperlipemia   . Prostate CA ([place]   . Sleep apnea    Dr CLedora Bottcher; uses CPAP  . Type II or unspecified type diabetes mellitus without mention of complication, not stated as uncontrolled   . Unspecified essential hypertension     Past Surgical History:  Procedure Laterality Date  . APPENDECTOMY    . CATARACT EXTRACTION, BILATERAL    . PROSTATECTOMY      Current Medications: Outpatient Medications Prior to Visit  Medication Sig Dispense Refill  . ACCU-CHEK FASTCLIX LANCETS MISC 1 each by Does not apply route 2 (two) times daily. Dx:E11.9 100 each 5  . Cholecalciferol (VITAMIN D3) 1000 UNITS CAPS Take by mouth daily.     . furosemide (LASIX) 40 MG tablet Take 1 tablet (40 mg total) by mouth daily as needed. 30 tablet 1  . glimepiride (AMARYL) 2 MG tablet Take 1 tablet (2 mg total) by  mouth 2 (two) times daily. 180 tablet 3  . glucose blood (ACCU-CHEK SMARTVIEW) test strip 1 each by Other route 2 (two) times daily. Use to check blood sugars twice a day Dx E11.9 100 each 3  . glucose blood test strip 1 each by Other route 2 (two) times daily. Please dispense brand of choice per pt and insurance preference. Dx:250.00 100 each 5  . glucose monitoring kit (FREESTYLE) monitoring kit 1 each by Does not apply route as needed for other. Please dispense brand of choice per ins/patient. Dx: 250.00. 1 each 0  . metFORMIN (GLUCOPHAGE) 1000 MG tablet Take 1 tablet (1,000 mg total) by mouth 2 (two) times daily with a meal. 180 tablet 3  . nitroGLYCERIN (NITROSTAT) 0.4 MG SL tablet Place 1 tablet (0.4 mg total) under the tongue every 5 (five) minutes as needed for chest pain. 20 tablet 3  . simvastatin (ZOCOR) 40 MG tablet Take 1 tablet (40 mg total) by mouth at bedtime. 90 tablet 3  . triamterene-hydrochlorothiazide (MAXZIDE-25) 37.5-25 MG tablet Take 1 tablet by mouth daily. 90 tablet 3  . valsartan (DIOVAN) 320 MG tablet Take 320 mg by mouth daily.    . vitamin C (ASCORBIC ACID) 500 MG tablet Take 1 tablet (500 mg total) by mouth daily. (Patient not taking: Reported on 06/19/2017) 100 tablet 3   Facility-Administered Medications Prior to Visit  Medication Dose Route Frequency Provider Last  Rate Last Dose  . pneumococcal 23 valent vaccine (PNU-IMMUNE) injection 0.5 mL  0.5 mL Intramuscular Once Plotnikov, [name], MD         Allergies:   Amlodipine; Clindamycin hcl; and Verapamil   Social History   Social History  . Marital status: Married    Spouse name: N/A  . Number of children: N/A  . Years of education: N/A   Occupational History  . Salesman    Social History Main Topics  . Smoking status: Never Smoker  . Smokeless tobacco: Never Used  . Alcohol use No  . Drug use: No  . Sexual activity: Yes   Other Topics Concern  . None   Social History Narrative   Regular  Exercise- no, golf     Family History:  The patient's family history includes Diabetes in his unknown relative; Heart disease in his mother; Hypertension in his unknown relative.   ROS:   Please see the history of present illness.    He denies claudication/claudication equivalent although activity has been curtailed by chest discomfort.  Left greater than right lower extremity swelling.  Negative Doppler done within the past 12 months. All other systems reviewed and are negative.   PHYSICAL EXAM:   VS:  BP (!) 162/76 (BP Location: Left Arm)   Pulse 87   Ht 6' (1.829 m)   Wt 254 lb 3.2 oz (115.3 kg)   BMI 34.48 kg/m    GEN: Well nourished, well developed, in no acute distress  HEENT: normal  Neck: no JVD, carotid bruits, or masses Cardiac: RRR.  S3 gallop is audible. No murmur, rubs, but asymmetric edema left greater than right.  Respiratory:  clear to auscultation bilaterally, normal work of breathing GI: soft, nontender, nondistended, + BS MS: no deformity or atrophy  Skin: warm and dry, no rash Neuro:  Alert and Oriented x 3, Strength and sensation are intact Psych: euthymic mood, full affect  Wt Readings from Last 3 Encounters:  06/19/17 254 lb 3.2 oz (115.3 kg)  06/18/17 255 lb (115.7 kg)  11/21/16 249 lb (112.9 kg)      Studies/Labs Reviewed:   EKG:  EKG performed on 06/18/2017 demonstrated right bundle, first-degree AV block, no acute ST-T wave change.  Recent Labs: 06/18/2017: ALT 17; BUN 21; Creatinine, Ser 1.21; Hemoglobin 14.2; Platelets 277.0; Potassium 4.7; Pro B Natriuretic peptide (BNP) 323.0; Sodium 141; TSH 1.90   Lipid Panel    Component Value Date/Time   CHOL 128 06/18/2017 0847   TRIG 105.0 06/18/2017 0847   HDL 40.40 06/18/2017 0847   CHOLHDL 3 06/18/2017 0847   VLDL 21.0 06/18/2017 0847   LDLCALC 66 06/18/2017 0847    Additional studies/ records that were reviewed today include:  Lower extremity venous Doppler 05/07/15: No evidence of lower  extremity deep or superficial venous thrombus or incompetence, bilaterally   ASSESSMENT:    1. Angina pectoris ([place])   2. Chronic combined systolic and diastolic heart failure ([place])   3. Essential hypertension   4. Type 2 diabetes mellitus with complication, without long-term current use of insulin ([place])   5. Hyperlipidemia LDL goal <70      PLAN:  In order of problems listed above:  1. Angina pectoris progressive over the past 6 months now associated with dyspnea with activities such as walking rapidly and sexual intercourse.  Relieved by rest.  Start aspirin 81 mg/day.  Nitroglycerin 0.4 mg sublingually as needed has been prescribed and explained.  I have recommended  that the patient undergo coronary angiography with possible PCI as the most appropriate way to evaluate his symptoms.  I suspect he will have some systolic dysfunction based upon exam. 2. This is a new suspected finding based upon symptoms, presence of an S3 gallop on exam, and the clinical scenario including EKG with wide QRS complex 3. Blood pressures well controlled. 4. Long-standing history of diabetes.  Increased risk of obstructive coronary disease in this patient with a family history of coronary atherosclerosis. 5. LDL is at target less than 70.  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   The patient was counseled to undergo left heart catheterization, coronary angiography, and possible percutaneous coronary intervention with stent implantation. The procedural risks and benefits were discussed in detail. The risks discussed included death, stroke, myocardial infarction, life-threatening bleeding, limb ischemia, kidney injury, allergy, and possible emergency cardiac surgery. The risk of these significant complications were estimated to occur less than 1% of the time. After discussion, the patient has agreed to proceed.   This extended visit.  Greater than 50% of  the time coordinating care and counseling the patient concerning treatment options and risks involved.  Medication Adjustments/Labs and Tests Ordered: Current medicines are reviewed at length with the patient today.  Concerns regarding medicines are outlined above.  Medication changes, Labs and Tests ordered today are listed in the Patient Instructions below. Patient Instructions  Medication Instructions:  1) START Aspirin 53m once daily  Labwork: None  Testing/Procedures: Your physician has requested that you have a cardiac catheterization. Cardiac catheterization is used to diagnose and/or treat various heart conditions. Doctors may recommend this procedure for a number of different reasons. The most common reason is to evaluate chest pain. Chest pain can be a symptom of coronary artery disease (CAD), and cardiac catheterization can show whether plaque is narrowing or blocking your heart's arteries. This procedure is also used to evaluate the valves, as well as measure the blood flow and oxygen levels in different parts of your heart. For further information please visit [email or web address] Please follow instruction sheet, as given.   Follow-Up: Your physician recommends that you schedule a follow-up appointment in: 2 weeks after catheterization with PA or NP.   Any Other Special Instructions Will Be Listed Below (If Applicable).    [facility] [address] [place] [id number]: [phone]: 3[place] 06/19/2017  You are scheduled for a Cardiac Catheterization on Monday, November 5 with Dr. HDaneen Schick  1. [facility] [address] [address]at 11:30 AM (two hours before your procedure to ensure your preparation). Free valet parking service is available.   Special note:  Every effort is made to have your procedure done on time. Please understand that emergencies sometimes delay scheduled procedures.  2. Diet: Do not eat or drink anything after midnight prior to your procedure except sips of water to take medications.  3. Labs: Your labs will be performed at the hospital after you arrive for your procedure.  4. Medication instructions in preparation for your procedure:  Do not take your Metformin the night before or the morning of your procedure.  Do not take your Glimepiride, Furosemide or Maxzide the morning of your procedure.    On the morning of your procedure, take your Aspirin and  any morning medicines NOT listed above.  You may use sips of water.  5. Plan for one night stay--bring personal belongings. 6. Bring a current list of your medications and current insurance cards. 7. You MUST have a responsible person to drive you home. 8. Someone MUST be with you the first 24 hours after you arrive home or your discharge will be delayed. 9. Please wear clothes that are easy to get on and off and wear slip-on shoes.  Thank you for allowing [place] to care for you!   -- [facility]    If you need a refill on your cardiac medications before your next appointment, please call your pharmacy.      Signed, Sinclair Grooms, MD  06/19/2017 3:31 PM    [facility], [place], [place]  [address] Phone: [phone]; Fax: [phone]

## 2017-06-19 NOTE — Progress Notes (Signed)
Cardiology Office Note    Date:  06/19/2017   ID:  Keil, Pickering [birth year], MRN [id number]  PCP:  Cassandria Anger, MD  Cardiologist: Sinclair Grooms, MD   Chief Complaint  [name]  . Chest Pain    History of Present Illness:  Larry [name] is a 76 y.o. male who was referred by Dr. Amalia Greenhouse for evaluation of exertional chest pain.  He has a background history of diabetes mellitus type 2, obstructive sleep apnea, essential hypertension, and hyperlipidemia.  Pleasant non-smoking gentleman with family history of CAD (brother and father both deceased and both with prior CABG), diabetes type 2 greater than 10 years and hypertension with a 6-39-monthhistory of exertional fatigue and chest tightness.  Over Larry past 2 months he has noticed tightness and extreme dyspnea during sexual intercourse.  Over Larry past month he has begun decreasing pace of walking and activities to avoid having Larry discomfort.  Rest relieves Larry discomfort after 10-15 minutes.  He denies spontaneous episodes of discomfort.  Past Medical History:  Diagnosis Date  . Cataracts, bilateral    removed  . Hyperlipemia   . [place] ([place]   . Sleep apnea    Dr CLedora Bottcher; uses CPAP  . Type II or unspecified type diabetes mellitus without mention of complication, not stated as uncontrolled   . Unspecified essential hypertension     Past Surgical History:  Procedure Laterality Date  . APPENDECTOMY    . CATARACT EXTRACTION, BILATERAL    . PROSTATECTOMY      Current Medications: Outpatient Medications Prior to Visit  Medication Sig Dispense Refill  . ACCU-CHEK FASTCLIX LANCETS MISC 1 each by Does not apply route 2 (two) times daily. Dx:E11.9 100 each 5  . Cholecalciferol (VITAMIN D3) 1000 UNITS CAPS Take by mouth daily.     . furosemide (LASIX) 40 MG tablet Take 1 tablet (40 mg total) by mouth daily as needed. 30 tablet 1  . glimepiride (AMARYL) 2 MG tablet Take 1 tablet (2 mg total) by  mouth 2 (two) times daily. 180 tablet 3  . glucose blood (ACCU-CHEK SMARTVIEW) test strip 1 each by Other route 2 (two) times daily. Use to check blood sugars twice a day Dx E11.9 100 each 3  . glucose blood test strip 1 each by Other route 2 (two) times daily. Please dispense brand of choice per pt and insurance preference. Dx:250.00 100 each 5  . glucose monitoring kit (FREESTYLE) monitoring kit 1 each by Does not apply route as needed for other. Please dispense brand of choice per ins/[name]. Dx: 250.00. 1 each 0  . metFORMIN (GLUCOPHAGE) 1000 MG tablet Take 1 tablet (1,000 mg total) by mouth 2 (two) times daily with a meal. 180 tablet 3  . nitroGLYCERIN (NITROSTAT) 0.4 MG SL tablet Place 1 tablet (0.4 mg total) under Larry tongue every 5 (five) minutes as needed for chest pain. 20 tablet 3  . simvastatin (ZOCOR) 40 MG tablet Take 1 tablet (40 mg total) by mouth at bedtime. 90 tablet 3  . triamterene-hydrochlorothiazide (MAXZIDE-25) 37.5-25 MG tablet Take 1 tablet by mouth daily. 90 tablet 3  . valsartan (DIOVAN) 320 MG tablet Take 320 mg by mouth daily.    . vitamin C (ASCORBIC ACID) 500 MG tablet Take 1 tablet (500 mg total) by mouth daily. ([name] not taking: Reported on 06/19/2017) 100 tablet 3   Facility-Administered Medications Prior to Visit  Medication Dose Route Frequency Provider Last  Rate Last Dose  . pneumococcal 23 valent vaccine (PNU-IMMUNE) injection 0.5 mL  0.5 mL Intramuscular Once Plotnikov, [name], MD         Allergies:   Amlodipine; Clindamycin hcl; and Verapamil   Social History   Social History  . Marital status: Married    Spouse name: N/A  . Number of children: N/A  . Years of education: N/A   Occupational History  . Salesman    Social History Main Topics  . Smoking status: Never Smoker  . Smokeless tobacco: Never Used  . Alcohol use No  . Drug use: No  . Sexual activity: Yes   Other Topics Concern  . None   Social History Narrative   Regular  Exercise- no, golf     Family History:  Larry [name]'s family history includes Diabetes in his unknown relative; Heart disease in his mother; Hypertension in his unknown relative.   ROS:   Please see Larry history of present illness.    He denies claudication/claudication equivalent although activity has been curtailed by chest discomfort.  Left greater than right lower extremity swelling.  Negative Doppler done within Larry past 12 months. All other systems reviewed and are negative.   PHYSICAL EXAM:   VS:  BP (!) 162/76 (BP Location: Left Arm)   Pulse 87   Ht 6' (1.829 m)   Wt 254 lb 3.2 oz (115.3 kg)   BMI 34.48 kg/m    GEN: Well nourished, well developed, in no acute distress  HEENT: normal  Neck: no JVD, carotid bruits, or masses Cardiac: RRR.  S3 gallop is audible. No murmur, rubs, but asymmetric edema left greater than right.  Respiratory:  clear to auscultation bilaterally, normal work of breathing GI: soft, nontender, nondistended, + BS MS: no deformity or atrophy  Skin: warm and dry, no rash Neuro:  Alert and Oriented x 3, Strength and sensation are intact Psych: euthymic mood, full affect  Wt Readings from Last 3 Encounters:  06/19/17 254 lb 3.2 oz (115.3 kg)  06/18/17 255 lb (115.7 kg)  11/21/16 249 lb (112.9 kg)      Studies/Labs Reviewed:   EKG:  EKG performed on 06/18/2017 demonstrated right bundle, first-degree AV block, no acute ST-T wave change.  Recent Labs: 06/18/2017: ALT 17; BUN 21; Creatinine, Ser 1.21; Hemoglobin 14.2; Platelets 277.0; Potassium 4.7; Pro B Natriuretic peptide (BNP) 323.0; Sodium 141; TSH 1.90   Lipid Panel    Component Value Date/Time   CHOL 128 06/18/2017 0847   TRIG 105.0 06/18/2017 0847   HDL 40.40 06/18/2017 0847   CHOLHDL 3 06/18/2017 0847   VLDL 21.0 06/18/2017 0847   LDLCALC 66 06/18/2017 0847    Additional studies/ records that were reviewed today include:  Lower extremity venous Doppler 05/07/15: No evidence of lower  extremity deep or superficial venous thrombus or incompetence, bilaterally   ASSESSMENT:    1. Angina pectoris ([place])   2. Chronic combined systolic and diastolic heart failure ([place])   3. Essential hypertension   4. Type 2 diabetes mellitus with complication, without long-term current use of insulin ([place])   5. Hyperlipidemia LDL goal <70      PLAN:  In order of problems listed above:  1. Angina pectoris progressive over Larry past 6 months now associated with dyspnea with activities such as walking rapidly and sexual intercourse.  Relieved by rest.  Start aspirin 81 mg/day.  Nitroglycerin 0.4 mg sublingually as needed has been prescribed and explained.  I have recommended  that Larry [name] undergo coronary angiography with possible PCI as Larry most appropriate way to evaluate his symptoms.  I suspect he will have some systolic dysfunction based upon exam. 2. This is a new suspected finding based upon symptoms, presence of an S3 gallop on exam, and Larry clinical scenario including EKG with wide QRS complex 3. Blood pressures well controlled. 4. Long-standing history of diabetes.  Increased risk of obstructive coronary disease in this [name] with a family history of coronary atherosclerosis. 5. LDL is at target less than 70.  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   Larry [name] was counseled to undergo left heart catheterization, coronary angiography, and possible percutaneous coronary intervention with stent implantation. Larry procedural risks and benefits were discussed in detail. Larry risks discussed included death, stroke, myocardial infarction, life-threatening bleeding, limb ischemia, kidney injury, allergy, and possible emergency cardiac surgery. Larry risk of these significant complications were estimated to occur less than 1% of Larry time. After discussion, Larry [name] has agreed to proceed.   This extended visit.  Greater than 50% of  Larry time coordinating care and counseling Larry [name] concerning treatment options and risks involved.  Medication Adjustments/Labs and Tests Ordered: Current medicines are reviewed at length with Larry [name] today.  Concerns regarding medicines are outlined above.  Medication changes, Labs and Tests ordered today are listed in Larry [name] Instructions below. [name] Instructions  Medication Instructions:  1) START Aspirin 22m once daily  Labwork: None  Testing/Procedures: Your physician has requested that you have a cardiac catheterization. Cardiac catheterization is used to diagnose and/or treat various heart conditions. Doctors may recommend this procedure for a number of different reasons. Larry most common reason is to evaluate chest pain. Chest pain can be a symptom of coronary artery disease (CAD), and cardiac catheterization can show whether plaque is narrowing or blocking your heart's arteries. This procedure is also used to evaluate Larry valves, as well as measure Larry blood flow and oxygen levels in different parts of your heart. For further information please visit [email or web address] Please follow instruction sheet, as given.   Follow-Up: Your physician recommends that you schedule a follow-up appointment in: 2 weeks after catheterization with PA or NP.   Any Other Special Instructions Will Be Listed Below (If Applicable).    [facility] [address] [place] [id number]: [phone]: 3[place] 06/19/2017  You are scheduled for a Cardiac Catheterization on Monday, November 5 with Dr. HDaneen Schick  1. [facility] [address] [place] [address]at 11:30 AM (two hours before your procedure to ensure your preparation). Free valet parking service is available.   Special note:  Every effort is made to have your procedure done on time. Please understand that emergencies sometimes delay scheduled procedures.  2. Diet: Do not eat or drink anything after midnight prior to your procedure except sips of water to take medications.  3. Labs: Your labs will be performed at Larry hospital after you arrive for your procedure.  4. Medication instructions in preparation for your procedure:  Do not take your Metformin Larry night before or Larry morning of your procedure.  Do not take your Glimepiride, Furosemide or Maxzide Larry morning of your procedure.    On Larry morning of your procedure, take your Aspirin and  any morning medicines NOT listed above.  You may use sips of water.  5. Plan for one night stay--bring personal belongings. 6. Bring a current list of your medications and current insurance cards. 7. You MUST have a responsible person to drive you home. 8. Someone MUST be with you Larry first 24 hours after you arrive home or your discharge will be delayed. 9. Please wear clothes that are easy to get on and off and wear slip-on shoes.  Thank you for allowing [place] to care for you!   -- [place] [facility]    If you need a refill on your cardiac medications before your next appointment, please call your pharmacy.      Signed, Sinclair Grooms, MD  06/19/2017 3:31 PM    [facility], [place], [place]  [address] Phone: [phone]; Fax: [phone]

## 2017-06-21 ENCOUNTER — Telehealth: Payer: Self-pay

## 2017-06-21 NOTE — Telephone Encounter (Signed)
Patient contacted pre-catheterization at [facility] scheduled for:  06/24/2017 @ 1330 Verified arrival time and place: Nt @ 1130 Confirmed AM meds to be taken pre-cath with sip of water: Take ASA Hold metformin, last dose Sunday am Am of procedure hold glimepiride and lasix Confirmed patient has responsible person to drive home post procedure and observe patient for 24 hours:  yes Addl concerns:  none

## 2017-06-24 ENCOUNTER — Other Ambulatory Visit: Payer: Self-pay

## 2017-06-24 ENCOUNTER — Inpatient Hospital Stay (HOSPITAL_COMMUNITY)
Admission: RE | Admit: 2017-06-24 | Discharge: 2017-07-03 | DRG: 234 | Disposition: A | Discharge status: Home Health | Payer: Medicare Other | Source: Ambulatory Visit | Attending: Cardiothoracic Surgery | Admitting: Cardiothoracic Surgery

## 2017-06-24 ENCOUNTER — Encounter (HOSPITAL_COMMUNITY): Payer: Self-pay | Admitting: *Deleted

## 2017-06-24 ENCOUNTER — Encounter (HOSPITAL_COMMUNITY)
Admission: RE | Disposition: A | Discharge status: Home Health | Payer: Self-pay | Source: Ambulatory Visit | Attending: Cardiothoracic Surgery

## 2017-06-24 DIAGNOSIS — R0789 Other chest pain: Secondary | ICD-10-CM | POA: Diagnosis present

## 2017-06-24 DIAGNOSIS — R112 Nausea with vomiting, unspecified: Secondary | ICD-10-CM | POA: Diagnosis not present

## 2017-06-24 DIAGNOSIS — Z951 Presence of aortocoronary bypass graft: Secondary | ICD-10-CM

## 2017-06-24 DIAGNOSIS — E785 Hyperlipidemia, unspecified: Secondary | ICD-10-CM | POA: Diagnosis present

## 2017-06-24 DIAGNOSIS — Z833 Family history of diabetes mellitus: Secondary | ICD-10-CM

## 2017-06-24 DIAGNOSIS — I110 Hypertensive heart disease with heart failure: Secondary | ICD-10-CM | POA: Diagnosis present

## 2017-06-24 DIAGNOSIS — I5042 Chronic combined systolic (congestive) and diastolic (congestive) heart failure: Secondary | ICD-10-CM | POA: Diagnosis present

## 2017-06-24 DIAGNOSIS — R0609 Other forms of dyspnea: Secondary | ICD-10-CM | POA: Diagnosis present

## 2017-06-24 DIAGNOSIS — I6522 Occlusion and stenosis of left carotid artery: Secondary | ICD-10-CM | POA: Diagnosis present

## 2017-06-24 DIAGNOSIS — D62 Acute posthemorrhagic anemia: Secondary | ICD-10-CM | POA: Diagnosis not present

## 2017-06-24 DIAGNOSIS — N393 Stress incontinence (female) (male): Secondary | ICD-10-CM | POA: Diagnosis present

## 2017-06-24 DIAGNOSIS — Z7984 Long term (current) use of oral hypoglycemic drugs: Secondary | ICD-10-CM

## 2017-06-24 DIAGNOSIS — I2510 Atherosclerotic heart disease of native coronary artery without angina pectoris: Secondary | ICD-10-CM | POA: Diagnosis present

## 2017-06-24 DIAGNOSIS — Z9079 Acquired absence of other genital organ(s): Secondary | ICD-10-CM

## 2017-06-24 DIAGNOSIS — I25110 Atherosclerotic heart disease of native coronary artery with unstable angina pectoris: Secondary | ICD-10-CM | POA: Diagnosis not present

## 2017-06-24 DIAGNOSIS — I25709 Atherosclerosis of coronary artery bypass graft(s), unspecified, with unspecified angina pectoris: Secondary | ICD-10-CM

## 2017-06-24 DIAGNOSIS — J9811 Atelectasis: Secondary | ICD-10-CM | POA: Diagnosis not present

## 2017-06-24 DIAGNOSIS — Z23 Encounter for immunization: Secondary | ICD-10-CM

## 2017-06-24 DIAGNOSIS — E876 Hypokalemia: Secondary | ICD-10-CM | POA: Diagnosis present

## 2017-06-24 DIAGNOSIS — Z8249 Family history of ischemic heart disease and other diseases of the circulatory system: Secondary | ICD-10-CM

## 2017-06-24 DIAGNOSIS — E119 Type 2 diabetes mellitus without complications: Secondary | ICD-10-CM

## 2017-06-24 DIAGNOSIS — Z8546 Personal history of malignant neoplasm of prostate: Secondary | ICD-10-CM

## 2017-06-24 DIAGNOSIS — E1165 Type 2 diabetes mellitus with hyperglycemia: Secondary | ICD-10-CM | POA: Diagnosis present

## 2017-06-24 DIAGNOSIS — Z79899 Other long term (current) drug therapy: Secondary | ICD-10-CM

## 2017-06-24 DIAGNOSIS — N320 Bladder-neck obstruction: Secondary | ICD-10-CM | POA: Diagnosis present

## 2017-06-24 DIAGNOSIS — E1159 Type 2 diabetes mellitus with other circulatory complications: Secondary | ICD-10-CM

## 2017-06-24 DIAGNOSIS — G4733 Obstructive sleep apnea (adult) (pediatric): Secondary | ICD-10-CM | POA: Diagnosis present

## 2017-06-24 HISTORY — PX: ULTRASOUND GUIDANCE FOR VASCULAR ACCESS: SHX6516

## 2017-06-24 HISTORY — PX: CORONARY ULTRASOUND/IVUS: CATH118244

## 2017-06-24 HISTORY — PX: LEFT HEART CATH AND CORONARY ANGIOGRAPHY: CATH118249

## 2017-06-24 LAB — CBC
HCT: 42.9 % (ref 39.0–52.0)
Hemoglobin: 14.1 g/dL (ref 13.0–17.0)
MCH: 30.1 pg (ref 26.0–34.0)
MCHC: 32.9 g/dL (ref 30.0–36.0)
MCV: 91.5 fL (ref 78.0–100.0)
PLATELETS: 261 10*3/uL (ref 150–400)
RBC: 4.69 MIL/uL (ref 4.22–5.81)
RDW: 13.6 % (ref 11.5–15.5)
WBC: 11.5 10*3/uL — AB (ref 4.0–10.5)

## 2017-06-24 LAB — PROTIME-INR
INR: 0.97
Prothrombin Time: 12.8 seconds (ref 11.4–15.2)

## 2017-06-24 LAB — CREATININE, SERUM
CREATININE: 1.21 mg/dL (ref 0.61–1.24)
GFR calc non Af Amer: 57 mL/min — ABNORMAL LOW (ref >60)

## 2017-06-24 LAB — GLUCOSE, CAPILLARY: GLUCOSE-CAPILLARY: 161 mg/dL — AB (ref 65–99)

## 2017-06-24 SURGERY — LEFT HEART CATH AND CORONARY ANGIOGRAPHY
Anesthesia: LOCAL

## 2017-06-24 MED ORDER — HEPARIN SODIUM (PORCINE) 1000 UNIT/ML IJ SOLN
INTRAMUSCULAR | Status: DC | PRN
  Administered 2017-06-24: 5000 [IU] via INTRAVENOUS
  Administered 2017-06-24: 6000 [IU] via INTRAVENOUS

## 2017-06-24 MED ORDER — NITROGLYCERIN IN D5W 200-5 MCG/ML-% IV SOLN: 0.0000 ug/min | INTRAVENOUS | Status: DC

## 2017-06-24 MED ORDER — SODIUM CHLORIDE 0.9% FLUSH: 3.0000 mL | INTRAVENOUS | Status: DC | PRN

## 2017-06-24 MED ORDER — IRBESARTAN 300 MG PO TABS
300.0000 mg | ORAL_TABLET | Freq: Every day | ORAL | Status: DC
  Administered 2017-06-24 – 2017-06-26 (×3): 300 mg via ORAL
  Filled 2017-06-24 (×4): qty 1
  Filled 2017-06-24 (×3): qty 2

## 2017-06-24 MED ORDER — HEPARIN SODIUM (PORCINE) 1000 UNIT/ML IJ SOLN
INTRAMUSCULAR | Status: AC
  Filled 2017-06-24: qty 1

## 2017-06-24 MED ORDER — NITROGLYCERIN 1 MG/10 ML FOR IR/CATH LAB
INTRA_ARTERIAL | Status: AC
  Filled 2017-06-24: qty 10

## 2017-06-24 MED ORDER — MORPHINE SULFATE (PF) 2 MG/ML IV SOLN: 2.0000 mg | INTRAVENOUS | Status: DC | PRN

## 2017-06-24 MED ORDER — CARVEDILOL 3.125 MG PO TABS
3.1250 mg | ORAL_TABLET | Freq: Two times a day (BID) | ORAL | Status: DC
  Administered 2017-06-24 – 2017-06-25 (×2): 3.125 mg via ORAL
  Filled 2017-06-24 (×2): qty 1

## 2017-06-24 MED ORDER — OXYCODONE HCL 5 MG PO TABS: 5.0000 mg | ORAL_TABLET | ORAL | Status: DC | PRN

## 2017-06-24 MED ORDER — MIDAZOLAM HCL 2 MG/2ML IJ SOLN
INTRAMUSCULAR | Status: AC
  Filled 2017-06-24: qty 2

## 2017-06-24 MED ORDER — SODIUM CHLORIDE 0.9 % WEIGHT BASED INFUSION: 1.0000 mL/kg/h | INTRAVENOUS | Status: DC

## 2017-06-24 MED ORDER — LIDOCAINE HCL (PF) 1 % IJ SOLN
INTRAMUSCULAR | Status: AC
  Filled 2017-06-24: qty 30

## 2017-06-24 MED ORDER — SODIUM CHLORIDE 0.9% FLUSH: 3.0000 mL | Freq: Two times a day (BID) | INTRAVENOUS | Status: DC

## 2017-06-24 MED ORDER — SODIUM CHLORIDE 0.9 % IV SOLN
INTRAVENOUS | Status: AC
  Administered 2017-06-24: 20:00:00 via INTRAVENOUS

## 2017-06-24 MED ORDER — HEPARIN SODIUM (PORCINE) 5000 UNIT/ML IJ SOLN
5000.0000 [IU] | Freq: Three times a day (TID) | INTRAMUSCULAR | Status: AC
  Administered 2017-06-24 – 2017-06-26 (×7): 5000 [IU] via SUBCUTANEOUS
  Filled 2017-06-24 (×6): qty 1

## 2017-06-24 MED ORDER — ONDANSETRON HCL 4 MG/2ML IJ SOLN
4.0000 mg | Freq: Four times a day (QID) | INTRAMUSCULAR | Status: DC | PRN
Start: 2017-06-24 — End: 2017-06-27
  Filled 2017-06-24: qty 2

## 2017-06-24 MED ORDER — HYDRALAZINE HCL 20 MG/ML IJ SOLN
10.0000 mg | INTRAMUSCULAR | Status: DC | PRN
  Administered 2017-06-24: 10 mg via INTRAVENOUS
  Filled 2017-06-24: qty 1

## 2017-06-24 MED ORDER — ASPIRIN 81 MG PO CHEW: 81.0000 mg | CHEWABLE_TABLET | ORAL | Status: DC

## 2017-06-24 MED ORDER — SIMVASTATIN 40 MG PO TABS
40.0000 mg | ORAL_TABLET | Freq: Every day | ORAL | Status: DC
  Administered 2017-06-24 – 2017-07-02 (×8): 40 mg via ORAL
  Filled 2017-06-24 (×8): qty 1

## 2017-06-24 MED ORDER — ASPIRIN EC 81 MG PO TBEC
81.0000 mg | DELAYED_RELEASE_TABLET | Freq: Every day | ORAL | Status: DC
  Administered 2017-06-25 – 2017-06-26 (×2): 81 mg via ORAL
  Filled 2017-06-24 (×2): qty 1

## 2017-06-24 MED ORDER — IOPAMIDOL (ISOVUE-370) INJECTION 76%
INTRAVENOUS | Status: AC
  Filled 2017-06-24: qty 50

## 2017-06-24 MED ORDER — NITROGLYCERIN 1 MG/10 ML FOR IR/CATH LAB
INTRA_ARTERIAL | Status: DC | PRN
  Administered 2017-06-24: 200 ug via INTRACORONARY

## 2017-06-24 MED ORDER — NITROGLYCERIN IN D5W 200-5 MCG/ML-% IV SOLN
INTRAVENOUS | Status: AC
  Filled 2017-06-24: qty 250

## 2017-06-24 MED ORDER — SODIUM CHLORIDE 0.9% FLUSH
3.0000 mL | Freq: Two times a day (BID) | INTRAVENOUS | Status: DC
  Administered 2017-06-24 – 2017-06-26 (×5): 3 mL via INTRAVENOUS

## 2017-06-24 MED ORDER — ACCU-CHEK FASTCLIX LANCETS MISC: 1.0000 | Freq: Two times a day (BID) | Status: DC

## 2017-06-24 MED ORDER — NITROGLYCERIN IN D5W 200-5 MCG/ML-% IV SOLN
INTRAVENOUS | Status: DC | PRN
  Administered 2017-06-24: 10 ug/min via INTRAVENOUS

## 2017-06-24 MED ORDER — ACETAMINOPHEN 325 MG PO TABS
650.0000 mg | ORAL_TABLET | ORAL | Status: DC | PRN
  Administered 2017-06-24: 650 mg via ORAL
  Filled 2017-06-24: qty 2

## 2017-06-24 MED ORDER — SODIUM CHLORIDE 0.9 % IV SOLN: 250.0000 mL | INTRAVENOUS | Status: DC | PRN

## 2017-06-24 MED ORDER — PROMETHAZINE HCL 25 MG/ML IJ SOLN
12.5000 mg | Freq: Four times a day (QID) | INTRAMUSCULAR | Status: DC | PRN
  Administered 2017-06-24: 12.5 mg via INTRAVENOUS
  Filled 2017-06-24: qty 1

## 2017-06-24 MED ORDER — IOPAMIDOL (ISOVUE-370) INJECTION 76%
INTRAVENOUS | Status: DC | PRN
  Administered 2017-06-24: 210 mL via INTRA_ARTERIAL

## 2017-06-24 MED ORDER — GLIMEPIRIDE 2 MG PO TABS
2.0000 mg | ORAL_TABLET | Freq: Two times a day (BID) | ORAL | Status: DC
  Administered 2017-06-24 – 2017-06-26 (×5): 2 mg via ORAL
  Filled 2017-06-24 (×6): qty 1

## 2017-06-24 MED ORDER — HYDRALAZINE HCL 20 MG/ML IJ SOLN
10.0000 mg | Freq: Three times a day (TID) | INTRAMUSCULAR | Status: DC | PRN
  Administered 2017-06-24: 10 mg via INTRAVENOUS
  Filled 2017-06-24: qty 1

## 2017-06-24 MED ORDER — FENTANYL CITRATE (PF) 100 MCG/2ML IJ SOLN
INTRAMUSCULAR | Status: AC
  Filled 2017-06-24: qty 2

## 2017-06-24 MED ORDER — LIDOCAINE HCL (PF) 1 % IJ SOLN
INTRAMUSCULAR | Status: DC | PRN
  Administered 2017-06-24: 2 mL

## 2017-06-24 MED ORDER — HEPARIN (PORCINE) IN NACL 2-0.9 UNIT/ML-% IJ SOLN
INTRAMUSCULAR | Status: AC
  Filled 2017-06-24: qty 1000

## 2017-06-24 MED ORDER — HEPARIN (PORCINE) IN NACL 2-0.9 UNIT/ML-% IJ SOLN
INTRAMUSCULAR | Status: DC | PRN
  Administered 2017-06-24: 1000 mL via INTRA_ARTERIAL

## 2017-06-24 MED ORDER — FUROSEMIDE 40 MG PO TABS
40.0000 mg | ORAL_TABLET | Freq: Two times a day (BID) | ORAL | Status: DC
  Administered 2017-06-24 – 2017-06-26 (×5): 40 mg via ORAL
  Filled 2017-06-24 (×5): qty 1

## 2017-06-24 MED ORDER — MIDAZOLAM HCL 2 MG/2ML IJ SOLN
INTRAMUSCULAR | Status: DC | PRN
  Administered 2017-06-24 (×2): 1 mg via INTRAVENOUS

## 2017-06-24 MED ORDER — SODIUM CHLORIDE 0.9 % WEIGHT BASED INFUSION
3.0000 mL/kg/h | INTRAVENOUS | Status: DC
  Administered 2017-06-24: 3 mL/kg/h via INTRAVENOUS

## 2017-06-24 MED ORDER — IOPAMIDOL (ISOVUE-370) INJECTION 76%
INTRAVENOUS | Status: AC
  Filled 2017-06-24: qty 100

## 2017-06-24 MED ORDER — VERAPAMIL HCL 2.5 MG/ML IV SOLN
INTRA_ARTERIAL | Status: DC | PRN
  Administered 2017-06-24: 10 mL via INTRA_ARTERIAL

## 2017-06-24 MED ORDER — FENTANYL CITRATE (PF) 100 MCG/2ML IJ SOLN
INTRAMUSCULAR | Status: DC | PRN
  Administered 2017-06-24: 25 ug via INTRAVENOUS
  Administered 2017-06-24: 50 ug via INTRAVENOUS

## 2017-06-24 SURGICAL SUPPLY — 19 items
CATH INFINITI 5 FR JL3.5 (CATHETERS) ×1 IMPLANT
CATH INFINITI JR4 5F (CATHETERS) ×1 IMPLANT
CATH LAUNCHER 5F EBU3.0 (CATHETERS) IMPLANT
CATH OPTICROSS 40MHZ (CATHETERS) ×1 IMPLANT
CATHETER LAUNCHER 5F EBU3.0 (CATHETERS) ×6
COVER PRB 48X5XTLSCP FOLD TPE (BAG) IMPLANT
COVER PROBE 5X48 (BAG) ×3
DEVICE RAD COMP TR BAND LRG (VASCULAR PRODUCTS) ×1 IMPLANT
ELECT DEFIB PAD ADLT CADENCE (PAD) ×1 IMPLANT
GLIDESHEATH SLEND A-KIT 6F 22G (SHEATH) ×1 IMPLANT
GUIDEWIRE INQWIRE 1.5J.035X260 (WIRE) IMPLANT
INQWIRE 1.5J .035X260CM (WIRE) ×3
KIT ESSENTIALS PG (KITS) ×2 IMPLANT
KIT HEART LEFT (KITS) ×3 IMPLANT
PACK CARDIAC CATHETERIZATION (CUSTOM PROCEDURE TRAY) ×3 IMPLANT
SLED PULL BACK IVUS (MISCELLANEOUS) ×1 IMPLANT
TRANSDUCER W/STOPCOCK (MISCELLANEOUS) ×3 IMPLANT
TUBING CIL FLEX 10 FLL-RA (TUBING) ×3 IMPLANT
WIRE ASAHI PROWATER 180CM (WIRE) ×1 IMPLANT

## 2017-06-24 NOTE — Progress Notes (Signed)
Patient continues to have elevated blood pressure despite IV nitroglycerin drip and administration of oral antihypertensives. Patient is also endorsing significant headache. Nitroglycerin drip titrated down and paged attending team. PRN order received will implement and continue to monitor.

## 2017-06-24 NOTE — Interval H&P Note (Signed)
Cath Lab Visit (complete for each Cath Lab visit)  Clinical Evaluation Leading to the Procedure:   ACS: No.  Non-ACS:    Anginal Classification: CCS III  Anti-ischemic medical therapy: Minimal Therapy (1 class of medications)  Non-Invasive Test Results: No non-invasive testing performed  Prior CABG: No previous CABG      History and Physical Interval Note:  06/24/2017 2:37 PM  Larry [name]  has presented today for surgery, with the diagnosis of angina  The various methods of treatment have been discussed with the patient and family. After consideration of risks, benefits and other options for treatment, the patient has consented to  Procedure(s): LEFT HEART CATH AND CORONARY ANGIOGRAPHY (N/A) Ultrasound Guidance For Vascular Access as a surgical intervention .  The patient's history has been reviewed, patient examined, no change in status, stable for surgery.  I have reviewed the patient's chart and labs.  Questions were answered to the patient's satisfaction.     Belva Crome III

## 2017-06-24 NOTE — Progress Notes (Signed)
Pt with headache and nausea and vomiting.  Zofran without relief added IV phenergan 12.5 mg  prn and morphine for severe headache

## 2017-06-24 NOTE — Progress Notes (Signed)
Day of Surgery Procedure(s) (LRB): LEFT HEART CATH AND CORONARY ANGIOGRAPHY (N/A) Ultrasound Guidance For Vascular Access Intravascular Ultrasound/IVUS (N/A) Subjective: Patient examined, coronary angiogram images and ventriculogram personally reviewed and counseled with patient. 76 year old hypertensive male diabetic non-smoker admitted after scheduled cardiac catheterization for exertional angina increasing in intensity and frequency.  Patient was found to have a 70% left main stenosis with three-vessel CAD.  Ejection fraction was moderately impaired at 45%.  LVEDP was elevated at 26 mmHg.  Patient has family history of coronary disease. He has previous history of prostate cancer He has sleep apnea for which he uses CPAP  Objective: Vital signs in last 24 hours: Temp:  [98.4 F (36.9 C)] 98.4 F (36.9 C) (11/05 1624) Pulse Rate:  [0-113] 100 (11/05 2004) Cardiac Rhythm: Normal sinus rhythm;Bundle branch block (11/05 1900) Resp:  [6-27] 12 (11/05 2004) BP: (166-214)/(84-115) 214/95 (11/05 2032) SpO2:  [96 %-100 %] 96 % (11/05 2004) Weight:  [254 lb (115.2 kg)] 254 lb (115.2 kg) (11/05 1204)  Hemodynamic parameters for last 24 hours:  Sinus rhythm  Intake/Output from previous day: No intake/output data recorded. Intake/Output this shift: No intake/output data recorded.       Exam    General- alert and comfortable   Lungs- clear without rales, wheezes   Cor- regular rate and rhythm, no murmur , gallop   Abdomen- soft, non-tender   Extremities - warm, non-tender, minimal edema   Neuro- oriented, appropriate, no focal weakness   Lab Results: Recent Labs    06/24/17 1724  WBC 11.5*  HGB 14.1  HCT 42.9  PLT 261   BMET:  Recent Labs    06/24/17 1724  CREATININE 1.21    PT/INR:  Recent Labs    06/24/17 1210  LABPROT 12.8  INR 0.97   ABG No results found for: PHART, HCO3, TCO2, ACIDBASEDEF, O2SAT CBG (last 3)  Recent Labs    06/24/17 1210  GLUCAP 161*     Assessment/Plan: S/P Procedure(s) (LRB): LEFT HEART CATH AND CORONARY ANGIOGRAPHY (N/A) Ultrasound Guidance For Vascular Access Intravascular Ultrasound/IVUS (N/A) Will review results of echocardiogram Plan CABG later this week, probable Thursday AM.  Pre-CABG Dopplers and PFTs pending. Patient understands the plan for CABG and is in agreement.  LOS: 0 days    Tharon Aquas Trigt III 06/24/2017

## 2017-06-25 ENCOUNTER — Encounter (HOSPITAL_COMMUNITY): Payer: Self-pay | Admitting: Interventional Cardiology

## 2017-06-25 ENCOUNTER — Other Ambulatory Visit (HOSPITAL_COMMUNITY): Payer: Medicare Other

## 2017-06-25 DIAGNOSIS — Z23 Encounter for immunization: Secondary | ICD-10-CM | POA: Diagnosis not present

## 2017-06-25 DIAGNOSIS — I10 Essential (primary) hypertension: Secondary | ICD-10-CM | POA: Diagnosis not present

## 2017-06-25 DIAGNOSIS — I6522 Occlusion and stenosis of left carotid artery: Secondary | ICD-10-CM | POA: Diagnosis not present

## 2017-06-25 DIAGNOSIS — J9811 Atelectasis: Secondary | ICD-10-CM | POA: Diagnosis not present

## 2017-06-25 DIAGNOSIS — Z9079 Acquired absence of other genital organ(s): Secondary | ICD-10-CM | POA: Diagnosis not present

## 2017-06-25 DIAGNOSIS — Z4682 Encounter for fitting and adjustment of non-vascular catheter: Secondary | ICD-10-CM | POA: Diagnosis not present

## 2017-06-25 DIAGNOSIS — Z833 Family history of diabetes mellitus: Secondary | ICD-10-CM | POA: Diagnosis not present

## 2017-06-25 DIAGNOSIS — Z8249 Family history of ischemic heart disease and other diseases of the circulatory system: Secondary | ICD-10-CM | POA: Diagnosis not present

## 2017-06-25 DIAGNOSIS — I509 Heart failure, unspecified: Secondary | ICD-10-CM | POA: Diagnosis not present

## 2017-06-25 DIAGNOSIS — Z7984 Long term (current) use of oral hypoglycemic drugs: Secondary | ICD-10-CM | POA: Diagnosis not present

## 2017-06-25 DIAGNOSIS — E119 Type 2 diabetes mellitus without complications: Secondary | ICD-10-CM | POA: Diagnosis not present

## 2017-06-25 DIAGNOSIS — Z79899 Other long term (current) drug therapy: Secondary | ICD-10-CM | POA: Diagnosis not present

## 2017-06-25 DIAGNOSIS — I5042 Chronic combined systolic (congestive) and diastolic (congestive) heart failure: Secondary | ICD-10-CM | POA: Diagnosis present

## 2017-06-25 DIAGNOSIS — I340 Nonrheumatic mitral (valve) insufficiency: Secondary | ICD-10-CM | POA: Diagnosis not present

## 2017-06-25 DIAGNOSIS — J90 Pleural effusion, not elsewhere classified: Secondary | ICD-10-CM | POA: Diagnosis not present

## 2017-06-25 DIAGNOSIS — Z8546 Personal history of malignant neoplasm of prostate: Secondary | ICD-10-CM | POA: Diagnosis not present

## 2017-06-25 DIAGNOSIS — N320 Bladder-neck obstruction: Secondary | ICD-10-CM | POA: Diagnosis present

## 2017-06-25 DIAGNOSIS — E876 Hypokalemia: Secondary | ICD-10-CM | POA: Diagnosis present

## 2017-06-25 DIAGNOSIS — I110 Hypertensive heart disease with heart failure: Secondary | ICD-10-CM | POA: Diagnosis present

## 2017-06-25 DIAGNOSIS — I25110 Atherosclerotic heart disease of native coronary artery with unstable angina pectoris: Secondary | ICD-10-CM | POA: Diagnosis not present

## 2017-06-25 DIAGNOSIS — I2510 Atherosclerotic heart disease of native coronary artery without angina pectoris: Secondary | ICD-10-CM | POA: Diagnosis not present

## 2017-06-25 DIAGNOSIS — E1165 Type 2 diabetes mellitus with hyperglycemia: Secondary | ICD-10-CM | POA: Diagnosis present

## 2017-06-25 DIAGNOSIS — Z01810 Encounter for preprocedural cardiovascular examination: Secondary | ICD-10-CM | POA: Diagnosis not present

## 2017-06-25 DIAGNOSIS — R0609 Other forms of dyspnea: Secondary | ICD-10-CM | POA: Diagnosis not present

## 2017-06-25 DIAGNOSIS — N393 Stress incontinence (female) (male): Secondary | ICD-10-CM | POA: Diagnosis present

## 2017-06-25 DIAGNOSIS — E785 Hyperlipidemia, unspecified: Secondary | ICD-10-CM | POA: Diagnosis present

## 2017-06-25 DIAGNOSIS — G4733 Obstructive sleep apnea (adult) (pediatric): Secondary | ICD-10-CM | POA: Diagnosis present

## 2017-06-25 DIAGNOSIS — D62 Acute posthemorrhagic anemia: Secondary | ICD-10-CM | POA: Diagnosis not present

## 2017-06-25 DIAGNOSIS — R112 Nausea with vomiting, unspecified: Secondary | ICD-10-CM | POA: Diagnosis not present

## 2017-06-25 LAB — PROTIME-INR
INR: 1.05
Prothrombin Time: 13.6 seconds (ref 11.4–15.2)

## 2017-06-25 LAB — BLOOD GAS, ARTERIAL
Acid-Base Excess: 3.2 mmol/L — ABNORMAL HIGH (ref 0.0–2.0)
Bicarbonate: 27 mmol/L (ref 20.0–28.0)
Drawn by: 41977
FIO2: 21
O2 Saturation: 95.4 %
Patient temperature: 98.6
pCO2 arterial: 39.1 mmHg (ref 32.0–48.0)
pH, Arterial: 7.453 — ABNORMAL HIGH (ref 7.350–7.450)
pO2, Arterial: 75 mmHg — ABNORMAL LOW (ref 83.0–108.0)

## 2017-06-25 LAB — URINALYSIS, ROUTINE W REFLEX MICROSCOPIC
Bilirubin Urine: NEGATIVE
Glucose, UA: >=500 mg/dL — AB
Hgb urine dipstick: NEGATIVE
Ketones, ur: 5 mg/dL — AB
Leukocytes, UA: NEGATIVE
Nitrite: NEGATIVE
Protein, ur: NEGATIVE mg/dL
RBC / HPF: NONE SEEN RBC/hpf (ref 0–5)
Specific Gravity, Urine: 1.027 (ref 1.005–1.030)
Squamous Epithelial / LPF: NONE SEEN
pH: 5 (ref 5.0–8.0)

## 2017-06-25 LAB — GLUCOSE, CAPILLARY
GLUCOSE-CAPILLARY: 155 mg/dL — AB (ref 65–99)
GLUCOSE-CAPILLARY: 181 mg/dL — AB (ref 65–99)
GLUCOSE-CAPILLARY: 166 mg/dL — AB (ref 65–99)

## 2017-06-25 LAB — HEMOGLOBIN A1C
Hgb A1c MFr Bld: 7.1 % — ABNORMAL HIGH (ref 4.8–5.6)
Mean Plasma Glucose: 157.07 mg/dL

## 2017-06-25 LAB — TSH: TSH: 0.879 u[IU]/mL (ref 0.350–4.500)

## 2017-06-25 LAB — POCT ACTIVATED CLOTTING TIME: Activated Clotting Time: 285 seconds

## 2017-06-25 MED ORDER — CARVEDILOL 6.25 MG PO TABS
6.2500 mg | ORAL_TABLET | Freq: Two times a day (BID) | ORAL | Status: DC
  Administered 2017-06-25 – 2017-06-26 (×3): 6.25 mg via ORAL
  Filled 2017-06-25 (×3): qty 1

## 2017-06-25 MED ORDER — ISOSORBIDE MONONITRATE ER 30 MG PO TB24: 30.0000 mg | ORAL_TABLET | Freq: Every day | ORAL | Status: DC

## 2017-06-25 MED ORDER — ISOSORBIDE MONONITRATE ER 30 MG PO TB24
15.0000 mg | ORAL_TABLET | Freq: Every day | ORAL | Status: DC
  Administered 2017-06-25 – 2017-06-26 (×2): 15 mg via ORAL
  Filled 2017-06-25 (×3): qty 1

## 2017-06-25 MED ORDER — INSULIN ASPART 100 UNIT/ML ~~LOC~~ SOLN
0.0000 [IU] | Freq: Three times a day (TID) | SUBCUTANEOUS | Status: DC
  Administered 2017-06-25 – 2017-06-26 (×3): 2 [IU] via SUBCUTANEOUS
  Administered 2017-06-26: 3 [IU] via SUBCUTANEOUS

## 2017-06-25 MED ORDER — INSULIN ASPART 100 UNIT/ML ~~LOC~~ SOLN: 0.0000 [IU] | Freq: Every day | SUBCUTANEOUS | Status: DC

## 2017-06-25 NOTE — Evaluation (Signed)
Physical Therapy Evaluation Patient Details Name: Larry [name] MRN: [id number] DOB: [birth year] Today's Date: 06/25/2017   History of Present Illness  76 y.o. male admitted for evaluation of exertional chest pain s/p cath 11/5 with 3 vessel CAD and plan for CABG.  PMHx: DM, obstructive sleep apnea, essential hypertension, and hyperlipidemia.  Clinical Impression  Pt very pleasant and moving well. Pt reports DOE for 6 months but with gait today he stated none and our hall distance would have normally made him SOB. Pt educated for sternal precautions and able to implement with all mobility with cues for sequence without difficulty. Pt does not currently require further physical therapy however, will plan to reevaluate post CABG for any additional needs, weakness or deficits. Currently recommend daily ambulation with nursing and cardiac rehab. Will follow after surgery.   HR 78-98 with activity SPo2 95% on RA    Follow Up Recommendations No PT follow up    Equipment Recommendations  None recommended by PT    Recommendations for Other Services       Precautions / Restrictions Precautions Precautions: None      Mobility  Bed Mobility Overal bed mobility: Independent                Transfers Overall transfer level: Independent               General transfer comment: pt educated for standing without UE for sternal precautions prep and able to perform x 3 trials  Ambulation/Gait Ambulation/Gait assistance: Independent Ambulation Distance (Feet): 800 Feet Assistive device: None Gait Pattern/deviations: WFL(Within Functional Limits)   Gait velocity interpretation: at or above normal speed for age/gender    Stairs            Wheelchair Mobility    Modified Rankin (Stroke Patients Only)       Balance Overall balance assessment: No apparent balance deficits (not formally assessed)                                           Pertinent  Vitals/Pain Pain Assessment: No/denies pain    Home Living Family/patient expects to be discharged to:: Private residence Living Arrangements: Spouse/significant other Available Help at Discharge: Family Type of Home: House Home Access: Level entry     Home Layout: Two level;Bed/bath upstairs;Able to live on main level with bedroom/bathroom Home Equipment: None Additional Comments: pt enters from basement with stairs to main living but is able to sleep and have bath in the basement    Prior Function Level of Independence: Independent               Hand Dominance        Extremity/Trunk Assessment   Upper Extremity Assessment Upper Extremity Assessment: Overall WFL for tasks assessed    Lower Extremity Assessment Lower Extremity Assessment: Overall WFL for tasks assessed    Cervical / Trunk Assessment Cervical / Trunk Assessment: Normal  Communication   Communication: No difficulties  Cognition Arousal/Alertness: Awake/alert Behavior During Therapy: WFL for tasks assessed/performed Overall Cognitive Status: Within Functional Limits for tasks assessed                                        General Comments      Exercises     Assessment/Plan  PT Assessment Patient needs continued PT services  PT Problem List Decreased activity tolerance       PT Treatment Interventions Functional mobility training;Patient/family education;Therapeutic activities;DME instruction;Gait training;Therapeutic exercise;Stair training    PT Goals (Current goals can be found in the Care Plan section)  Acute Rehab PT Goals Patient Stated Goal: golf PT Goal Formulation: With patient/family Time For Goal Achievement: 07/09/17 Potential to Achieve Goals: Good    Frequency Min 1X/week(until post CABG for assessment of post surgical needs)   Barriers to discharge        Co-evaluation               AM-PAC PT "6 Clicks" Daily Activity  Outcome Measure  Difficulty turning over in bed (including adjusting bedclothes, sheets and blankets)?: None Difficulty moving from lying on back to sitting on the side of the bed? : None Difficulty sitting down on and standing up from a chair with arms (e.g., wheelchair, bedside commode, etc,.)?: None Help needed moving to and from a bed to chair (including a wheelchair)?: None Help needed walking in hospital room?: None Help needed climbing 3-5 steps with a railing? : None 6 Click Score: 24    End of Session   Activity Tolerance: Patient tolerated treatment well Patient left: with call bell/phone within reach;with family/visitor present Nurse Communication: Mobility status PT Visit Diagnosis: Other abnormalities of gait and mobility (R26.89)    Time: 4332-9518 PT Time Calculation (min) (ACUTE ONLY): 14 min   Charges:   PT Evaluation $PT Eval Low Complexity: 1 Low     PT G CodesElwyn Reach, PT [phone]   Jakyrah Holladay B Courtnie Brenes 06/25/2017, 1:31 PM

## 2017-06-25 NOTE — Progress Notes (Addendum)
Progress Note  Patient Name: Larry [name] Date of Encounter: 06/25/2017  Primary Cardiologist: Sinclair Grooms, MD   Subjective   Denies chest pain or shortness of breath overnight. He did have uncontrolled blood pressure with SBP 200s overnight but says this is unusual for him and thinks that he believes this is related to an adverse reaction to the contrast used during catheterization.   Inpatient Medications    Scheduled Meds: . aspirin EC  81 mg Oral Daily  . carvedilol  3.125 mg Oral BID WC  . furosemide  40 mg Oral BID  . glimepiride  2 mg Oral BID  . heparin  5,000 Units Subcutaneous Q8H  . irbesartan  300 mg Oral Daily  . simvastatin  40 mg Oral QHS  . sodium chloride flush  3 mL Intravenous Q12H   Continuous Infusions: . sodium chloride    . nitroGLYCERIN     PRN Meds: sodium chloride, acetaminophen, hydrALAZINE, morphine injection, ondansetron (ZOFRAN) IV, oxyCODONE, promethazine, sodium chloride flush   Vital Signs    Vitals:   06/24/17 2230 06/25/17 0026 06/25/17 0300 06/25/17 0700  BP: (!) 173/74 (!) 158/81 (!) 161/70 (!) 146/72  Pulse: (!) 110 (!) 108 (!) 105 85  Resp: 18 (!) 24  11  Temp:  99.3 F (37.4 C) 99.4 F (37.4 C) 98.5 F (36.9 C)  TempSrc:  Oral Oral Oral  SpO2: 94% 94% 94%   Weight:      Height:       No intake or output data in the 24 hours ending 06/25/17 0850 Filed Weights   06/24/17 1204  Weight: 254 lb (115.2 kg)    Telemetry    Sinus rhythm - Personally Reviewed  Physical Exam   GEN: well appearing, sitting up in bed conversing with family, no acute distress.   Neck: No JVD Cardiac: RRR, no murmurs, rubs, or gallops.  Respiratory: Clear to auscultation bilaterally, course bibasilar rhonchi which clear with coughing  GI: Soft, nontender, non-distended  MS: 2+ lower extremity edema; No deformity. Neuro:  Nonfocal  Psych: Normal affect   Labs    Chemistry Recent Labs  Lab 06/24/17 1724  CREATININE 1.21    GFRNONAA 57*  GFRAA >60     Hematology Recent Labs  Lab 06/24/17 1724  WBC 11.5*  RBC 4.69  HGB 14.1  HCT 42.9  MCV 91.5  MCH 30.1  MCHC 32.9  RDW 13.6  PLT 261   Lipid Panel     Component Value Date/Time   CHOL 128 06/18/2017 0847   TRIG 105.0 06/18/2017 0847   HDL 40.40 06/18/2017 0847   CHOLHDL 3 06/18/2017 0847   VLDL 21.0 06/18/2017 0847   LDLCALC 66 06/18/2017 0847   Cardiac EnzymesNo results for input(s): TROPONINI in the last 168 hours. No results for input(s): TROPIPOC in the last 168 hours.   BNPNo results for input(s): BNP, PROBNP in the last 168 hours.   DDimer No results for input(s): DDIMER in the last 168 hours.   Radiology    No results found.  Cardiac Studies   Catheterization 11/5    Significant ostial left main in the 50-70% range.  Attempted intravascular ultrasound but the probe would not pass beyond the ostium of the left main.  Selective engagement produced ventricularization and pressure damping with reproduction of symptoms that the patient has clinically.  75-80% calcified mid LAD stenosis and 90+ percent stenosis in the second diagonal.  Moderate first and second obtuse  marginal stenoses.  Dominant right coronary with eccentric 40-50% mid vessel narrowing.  Left ventricular global hypokinesis with estimated EF 35-40%.  Elevated end-diastolic pressure consistent with chronic combined systolic and diastolic heart failure. LV dysfunction may be out of proportion for the coronary disease identified.  RECOMMENDATIONS:   Blood pressure control with IV nitroglycerin and resolve chest pain.  Resume home medications.  Add low-dose beta-blocker therapy and optimize, Resume ARB. Continue diuretic.  Inpatient or outpatient TCTS consultation to consider surgical revascularization.  Echocardiography to assess left ventricular function by alternative means.     Patient Profile     76 y.o. male with history of type 2 diabetes  mellitus ( A1c 7.1, not on insulin), obstructive sleep apnea, essential hypertension, hyperlipidemia with family history of of CAD. He presented to Dr. Thompson Caul office on 10/31 after referral from Dr. Amalia Greenhouse for evaluation of a > 6 month history of exertional chest tightness associated with dyspnea and fatigue which relieve with rest. Yesterday he was taken for a scheduled cath yesterday which revealed left main disease and 75-80 % with three vessel disease. CT surgery evaluated yesterday and are preparing for CABG.   Assessment & Plan    Coronary artery disease- Found to have 75-80% stenosis of the left main and with three vessel CAD, evaluated by TCTS yesterday and will go for CABG later this week. On simvastatin 40 mg qd with LDL 66. On aspirin 81, subcutaneous heparin, sublingual nitro PRN. Coreg 3.125 mg BID with HR 90-100s and hypertensive, will increase this today. 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 On po Lasix 40 mg BID. Follow up echo.   Hypertension - BP not controlled - SBP 140-200 overnight, patient says this is unusual and he relates it to not feeling well after the cath. On irbesartan 300 mg qd, coreg 3.125 BID, and lasix 40 po BID. Hydralazine 10 q8h PRN is ordered. Will increase coreg and continue to monitor blood pressure throughout the day, if his blood pressure remains uncontrolled will need to add on an additional agent.    Hyperlipidemia - on simvastatin 40 mg qd, LDL last month 66   Type 2 Diabetes mellitus - A1c 7.1, controlled on metformin and glimepiride outpatient, on glimepiride 2 mg BID in patient. Continue to monitor CBG.   For questions or updates, please contact [place] Please consult [email or web address] for contact info under Cardiology/STEMI.   Signed, Ledell Noss, MD  06/25/2017, 8:50 AM    Patient seen and examined. Agree with assessment and plan. Cath data reviewed.  Plan for CABG tentatively set for Thursday. Had headache yesterday, off IV NTG. BP elevated earlier. No JVD, no rales; RRR 1/6 sem, no s3, abd soft, 1+-LE edema.  Will try to resume isosorbide at 15 mg today and increase to 30 mg if tolerates. , increase coreg with HR 87 - 95; if recurrent pain start iv heparin.  Cr stable post cath; re-check tomorrow. Will check ECG today.  Keep in hospital until CABG.   Troy Sine, MD, [facility] 06/25/2017 10:29 AM

## 2017-06-25 NOTE — Progress Notes (Signed)
TR band has been removed. Transparent dressing placed. Site level 1 with some bruising. Pt resting comfortably, no complications. Will continue to monitor.

## 2017-06-25 NOTE — Progress Notes (Signed)
Inpatient Diabetes Program Recommendations  AACE/ADA: New Consensus Statement on Inpatient Glycemic Control (2015)  Target Ranges:  Prepandial:   less than 140 mg/dL      Peak postprandial:   less than 180 mg/dL (1-2 hours)      Critically ill patients:  140 - 180 mg/dL   Lab Results  Component Value Date   GLUCAP 161 (H) 06/24/2017   HGBA1C 7.1 (H) 06/25/2017    Review of Glycemic ControlResults for JAMARL, PEW (MRN [id number]) as of 06/25/2017 11:47  Ref. Range 06/24/2017 12:10  Glucose-Capillary Latest Ref Range: 65 - 99 mg/dL 161 (H)   Diabetes history: Type 2 DM  Outpatient Diabetes medications: Amaryl 2 mg with breakfast, Metformin 1000 mg bid Current orders for Inpatient glycemic control:  Amaryl 2 mg bid  Inpatient Diabetes Program Recommendations:    Please add Novolog moderate correction tid with meals and HS.  Called and discussed with RN.   Thanks Adah Perl, RN, BC-ADM Inpatient Diabetes Coordinator Pager [phone] (8a-5p)

## 2017-06-25 NOTE — Progress Notes (Signed)
CARDIAC REHAB PHASE I   PRE:  Rate/Rhythm: 84 SR    BP: sitting 125/53    SaO2: 98 RA  MODE:  Ambulation: 430 ft   POST:  Rate/Rhythm: 95 SR with PVCs    BP: sitting 143/53     SaO2: 98 RA  Pt eager to walk. Tolerated well although he was slightly unsteady at first getting up. Denied CP walking, enjoyed walking. To recliner. Discussed sternal precautions, IS, mobility post op, and d/c planning. Voiced understanding. Inspiring 1750 mL on IS. Gave book and video to watch with family.  [place], ACSM 06/25/2017 11:21 AM

## 2017-06-26 ENCOUNTER — Encounter (HOSPITAL_COMMUNITY): Payer: Self-pay | Admitting: Certified Registered"

## 2017-06-26 ENCOUNTER — Inpatient Hospital Stay (HOSPITAL_COMMUNITY): Payer: Medicare Other

## 2017-06-26 ENCOUNTER — Encounter (HOSPITAL_COMMUNITY): Payer: Medicare Other

## 2017-06-26 DIAGNOSIS — I6522 Occlusion and stenosis of left carotid artery: Secondary | ICD-10-CM

## 2017-06-26 DIAGNOSIS — Z01810 Encounter for preprocedural cardiovascular examination: Secondary | ICD-10-CM

## 2017-06-26 DIAGNOSIS — R0609 Other forms of dyspnea: Secondary | ICD-10-CM

## 2017-06-26 DIAGNOSIS — I25110 Atherosclerotic heart disease of native coronary artery with unstable angina pectoris: Secondary | ICD-10-CM

## 2017-06-26 DIAGNOSIS — I509 Heart failure, unspecified: Secondary | ICD-10-CM

## 2017-06-26 LAB — BLOOD GAS, ARTERIAL
Acid-Base Excess: 3.5 mmol/L — ABNORMAL HIGH (ref 0.0–2.0)
Bicarbonate: 27.1 mmol/L (ref 20.0–28.0)
Drawn by: 24486
FIO2: 21
O2 Saturation: 97.5 %
Patient temperature: 98.6
pCO2 arterial: 38.9 mmHg (ref 32.0–48.0)
pH, Arterial: 7.458 — ABNORMAL HIGH (ref 7.350–7.450)
pO2, Arterial: 90.4 mmHg (ref 83.0–108.0)

## 2017-06-26 LAB — ABO/RH: ABO/RH(D): O POS

## 2017-06-26 LAB — GLUCOSE, CAPILLARY
GLUCOSE-CAPILLARY: 181 mg/dL — AB (ref 65–99)
GLUCOSE-CAPILLARY: 171 mg/dL — AB (ref 65–99)
GLUCOSE-CAPILLARY: 202 mg/dL — AB (ref 65–99)
Glucose-Capillary: 172 mg/dL — ABNORMAL HIGH (ref 65–99)

## 2017-06-26 LAB — CBC
HCT: 39.7 % (ref 39.0–52.0)
Hemoglobin: 12.7 g/dL — ABNORMAL LOW (ref 13.0–17.0)
MCH: 29.3 pg (ref 26.0–34.0)
MCHC: 32 g/dL (ref 30.0–36.0)
MCV: 91.5 fL (ref 78.0–100.0)
PLATELETS: 237 10*3/uL (ref 150–400)
RBC: 4.34 MIL/uL (ref 4.22–5.81)
RDW: 13.5 % (ref 11.5–15.5)
WBC: 10.2 10*3/uL (ref 4.0–10.5)

## 2017-06-26 LAB — TYPE AND SCREEN
ABO/RH(D): O POS
Antibody Screen: NEGATIVE

## 2017-06-26 LAB — BASIC METABOLIC PANEL
Anion gap: 9 (ref 5–15)
BUN: 19 mg/dL (ref 6–20)
CALCIUM: 8.4 mg/dL — AB (ref 8.9–10.3)
CO2: 27 mmol/L (ref 22–32)
CREATININE: 1.42 mg/dL — AB (ref 0.61–1.24)
Chloride: 102 mmol/L (ref 101–111)
GFR calc non Af Amer: 47 mL/min — ABNORMAL LOW (ref >60)
GFR, EST AFRICAN AMERICAN: 54 mL/min — AB (ref >60)
Glucose, Bld: 173 mg/dL — ABNORMAL HIGH (ref 65–99)
Potassium: 3.4 mmol/L — ABNORMAL LOW (ref 3.5–5.1)
Sodium: 138 mmol/L (ref 135–145)

## 2017-06-26 LAB — ECHOCARDIOGRAM COMPLETE
Height: 72 in
WEIGHTICAEL: 4064 [oz_av]

## 2017-06-26 MED ORDER — TEMAZEPAM 15 MG PO CAPS: 15.0000 mg | ORAL_CAPSULE | Freq: Once | ORAL | Status: DC | PRN

## 2017-06-26 MED ORDER — MAGNESIUM SULFATE 50 % IJ SOLN
40.0000 meq | INTRAMUSCULAR | Status: DC
  Filled 2017-06-26: qty 9.85

## 2017-06-26 MED ORDER — DEXTROSE 5 % IV SOLN
750.0000 mg | INTRAVENOUS | Status: DC
  Filled 2017-06-26: qty 750

## 2017-06-26 MED ORDER — NITROGLYCERIN IN D5W 200-5 MCG/ML-% IV SOLN: 2.0000 ug/min | INTRAVENOUS | Status: DC

## 2017-06-26 MED ORDER — TRANEXAMIC ACID (OHS) PUMP PRIME SOLUTION
2.0000 mg/kg | INTRAVENOUS | Status: DC
  Filled 2017-06-26: qty 2.3

## 2017-06-26 MED ORDER — CHLORHEXIDINE GLUCONATE 4 % EX LIQD
60.0000 mL | Freq: Once | CUTANEOUS | Status: AC
  Administered 2017-06-26: 4 via TOPICAL
  Filled 2017-06-26: qty 60

## 2017-06-26 MED ORDER — TRANEXAMIC ACID 1000 MG/10ML IV SOLN
1.5000 mg/kg/h | INTRAVENOUS | Status: AC
  Administered 2017-06-27: 1.5 mg/kg/h via INTRAVENOUS
  Administered 2017-06-27: 14:00:00 via INTRAVENOUS
  Filled 2017-06-26: qty 25

## 2017-06-26 MED ORDER — PHENYLEPHRINE HCL 10 MG/ML IJ SOLN
30.0000 ug/min | INTRAMUSCULAR | Status: AC
  Administered 2017-06-27: 20 ug/min via INTRAVENOUS
  Filled 2017-06-26: qty 2

## 2017-06-26 MED ORDER — DOPAMINE-DEXTROSE 3.2-5 MG/ML-% IV SOLN: 0.0000 ug/kg/min | INTRAVENOUS | Status: DC

## 2017-06-26 MED ORDER — SODIUM CHLORIDE 0.9 % IV SOLN
INTRAVENOUS | Status: DC
  Filled 2017-06-26: qty 1

## 2017-06-26 MED ORDER — POTASSIUM CHLORIDE 2 MEQ/ML IV SOLN
80.0000 meq | INTRAVENOUS | Status: DC
  Filled 2017-06-26: qty 40

## 2017-06-26 MED ORDER — TRANEXAMIC ACID (OHS) BOLUS VIA INFUSION: 15.0000 mg/kg | INTRAVENOUS | Status: DC

## 2017-06-26 MED ORDER — PLASMA-LYTE 148 IV SOLN
INTRAVENOUS | Status: AC
  Administered 2017-06-27: 5000 mL
  Filled 2017-06-26: qty 2.5

## 2017-06-26 MED ORDER — POTASSIUM CHLORIDE CRYS ER 20 MEQ PO TBCR
20.0000 meq | EXTENDED_RELEASE_TABLET | Freq: Once | ORAL | Status: AC
  Administered 2017-06-26: 20 meq via ORAL
  Filled 2017-06-26: qty 1

## 2017-06-26 MED ORDER — CHLORHEXIDINE GLUCONATE 4 % EX LIQD
60.0000 mL | Freq: Once | CUTANEOUS | Status: AC
  Administered 2017-06-27: 4 via TOPICAL
  Filled 2017-06-26: qty 60

## 2017-06-26 MED ORDER — METOPROLOL TARTRATE 12.5 MG HALF TABLET
12.5000 mg | ORAL_TABLET | Freq: Once | ORAL | Status: AC
  Administered 2017-06-27: 12.5 mg via ORAL
  Filled 2017-06-26: qty 1

## 2017-06-26 MED ORDER — VANCOMYCIN HCL 10 G IV SOLR
1500.0000 mg | INTRAVENOUS | Status: DC
  Filled 2017-06-26: qty 1500

## 2017-06-26 MED ORDER — CHLORHEXIDINE GLUCONATE 0.12 % MT SOLN
15.0000 mL | Freq: Once | OROMUCOSAL | Status: AC
  Administered 2017-06-27: 15 mL via OROMUCOSAL
  Filled 2017-06-26: qty 15

## 2017-06-26 MED ORDER — TRANEXAMIC ACID (OHS) BOLUS VIA INFUSION
15.0000 mg/kg | INTRAVENOUS | Status: AC
  Administered 2017-06-27: 1728 mg via INTRAVENOUS
  Filled 2017-06-26: qty 1728

## 2017-06-26 MED ORDER — TRANEXAMIC ACID 1000 MG/10ML IV SOLN: 1.5000 mg/kg/h | INTRAVENOUS | Status: DC

## 2017-06-26 MED ORDER — DEXTROSE 5 % IV SOLN
1.5000 g | INTRAVENOUS | Status: AC
  Administered 2017-06-27: 1.5 g via INTRAVENOUS
  Administered 2017-06-27: .75 g via INTRAVENOUS
  Filled 2017-06-26: qty 1.5

## 2017-06-26 MED ORDER — DIAZEPAM 5 MG PO TABS
5.0000 mg | ORAL_TABLET | Freq: Once | ORAL | Status: AC
  Administered 2017-06-27: 5 mg via ORAL
  Filled 2017-06-26: qty 1

## 2017-06-26 MED ORDER — EPINEPHRINE PF 1 MG/ML IJ SOLN: 0.0000 ug/min | INTRAVENOUS | Status: DC

## 2017-06-26 MED ORDER — NITROGLYCERIN IN D5W 200-5 MCG/ML-% IV SOLN
2.0000 ug/min | INTRAVENOUS | Status: DC
Start: 2017-06-27 — End: 2017-06-26
  Filled 2017-06-26: qty 250

## 2017-06-26 MED ORDER — EPINEPHRINE PF 1 MG/ML IJ SOLN
0.0000 ug/min | INTRAVENOUS | Status: DC
  Filled 2017-06-26: qty 4

## 2017-06-26 MED ORDER — BISACODYL 5 MG PO TBEC: 5.0000 mg | DELAYED_RELEASE_TABLET | Freq: Once | ORAL | Status: DC

## 2017-06-26 MED ORDER — SODIUM CHLORIDE 0.9 % IV SOLN
INTRAVENOUS | Status: DC
  Filled 2017-06-26: qty 30

## 2017-06-26 MED ORDER — SODIUM CHLORIDE 0.9 % IV SOLN: INTRAVENOUS | Status: DC

## 2017-06-26 MED ORDER — SODIUM CHLORIDE 0.9 % IV SOLN
30.0000 ug/min | INTRAVENOUS | Status: DC
  Filled 2017-06-26: qty 2

## 2017-06-26 MED ORDER — DEXMEDETOMIDINE HCL IN NACL 400 MCG/100ML IV SOLN: 0.1000 ug/kg/h | INTRAVENOUS | Status: DC

## 2017-06-26 MED ORDER — PLASMA-LYTE 148 IV SOLN: INTRAVENOUS | Status: DC

## 2017-06-26 MED ORDER — SODIUM CHLORIDE 0.9 % IV SOLN
INTRAVENOUS | Status: AC
  Administered 2017-06-27: 2.5 [IU]/h via INTRAVENOUS
  Filled 2017-06-26: qty 1

## 2017-06-26 MED ORDER — DOPAMINE-DEXTROSE 3.2-5 MG/ML-% IV SOLN
0.0000 ug/kg/min | INTRAVENOUS | Status: DC
  Administered 2017-06-27: 2.5 ug/kg/min via INTRAVENOUS
  Filled 2017-06-26: qty 250

## 2017-06-26 MED ORDER — DEXMEDETOMIDINE HCL IN NACL 400 MCG/100ML IV SOLN
0.1000 ug/kg/h | INTRAVENOUS | Status: DC
  Filled 2017-06-26: qty 100

## 2017-06-26 NOTE — Progress Notes (Signed)
Pre-op Cardiac Surgery  Carotid Findings: Right - 40% to 59% ICA stenosis. Left 80% to 99% ICA stenosis. Bilateral - Vertebral flow is antegrade.  Upper Extremity Right Left  Brachial Pressures 153 Triphasic 147 Triphasic  Radial Waveforms Triphasic Triphasic  Ulnar Waveforms Triphasic Triphasic  Palmar Arch (Allen's Test) Normal Normal   Findings:  Doppler waveforms remained normal bilaterally with both radial and ulnar compressions.    Lower  Extremity Right Left  Dorsalis Pedis 129 Triphasic 175 Triphasic  Posterior Tibial 136 Triphasic 180 Triphasic  Ankle/Brachial Indices 0.89 1.18    Findings:  ABIs indicate a mild reduction in arterial flow on the right at rest. Left ABIs indicate normal arterial flow at rest. TBIs are normal bilaterally.  [facility], RVS 06/26/2017 1:26 pm

## 2017-06-26 NOTE — Progress Notes (Signed)
2 Days Post-Op Procedure(s) (LRB): LEFT HEART CATH AND CORONARY ANGIOGRAPHY (N/A) Ultrasound Guidance For Vascular Access Intravascular Ultrasound/IVUS (N/A) Subjective: Left main 3V CAD- stable on heparin Echo, dopplers pending CABG in am Objective: Vital signs in last 24 hours: Temp:  [97.6 F (36.4 C)-98.4 F (36.9 C)] 98.4 F (36.9 C) (11/07 0810) Pulse Rate:  [64-98] 70 (11/07 0349) Cardiac Rhythm: Normal sinus rhythm;Bundle branch block (11/07 0700) Resp:  [10-19] 15 (11/07 0810) BP: (114-170)/(58-71) 170/66 (11/07 0810) SpO2:  [95 %-97 %] 96 % (11/07 0810)  Hemodynamic parameters for last 24 hours:  stable  Intake/Output from previous day: 11/06 0701 - 11/07 0700 In: 662 [P.O.:662] Out: 450 [Urine:450] Intake/Output this shift: No intake/output data recorded.       Exam    General- alert and comfortable   Lungs- clear without rales, wheezes   Cor- regular rate and rhythm, no murmur , gallop   Abdomen- soft, non-tender   Extremities - warm, non-tender, minimal edema   Neuro- oriented, appropriate, no focal weakness   Lab Results: Recent Labs    06/24/17 1724 06/26/17 0207  WBC 11.5* 10.2  HGB 14.1 12.7*  HCT 42.9 39.7  PLT 261 237   BMET:  Recent Labs    06/24/17 1724 06/26/17 0207  NA  --  138  K  --  3.4*  CL  --  102  CO2  --  27  GLUCOSE  --  173*  BUN  --  19  CREATININE 1.21 1.42*  CALCIUM  --  8.4*    PT/INR:  Recent Labs    06/25/17 0318  LABPROT 13.6  INR 1.05   ABG    Component Value Date/Time   PHART 7.453 (H) 06/25/2017 0338   HCO3 27.0 06/25/2017 0338   O2SAT 95.4 06/25/2017 0338   CBG (last 3)  Recent Labs    06/25/17 1718 06/25/17 2114 06/26/17 0809  GLUCAP 181* 166* 181*    Assessment/Plan: S/P Procedure(s) (LRB): LEFT HEART CATH AND CORONARY ANGIOGRAPHY (N/A) Ultrasound Guidance For Vascular Access Intravascular Ultrasound/IVUS (N/A) CABG in am Procedure, benefits and risks d/w patient   LOS: 1 day     Larry [name] 06/26/2017

## 2017-06-26 NOTE — Consult Note (Signed)
[facility] NOTE   MRN : [id number]  Reason for Consult: Carotid stenosis Referring Physician: Dr. Nils Pyle  History of Present Illness: 76 y/o male with history of exertional chest pain who under went Coronary angiography by Dr. Daneen Schick on 06/24/2017.   Conclusion    Significant ostial left main in the 50-70% range.  Attempted intravascular ultrasound but the probe would not pass beyond the ostium of the left main.  Selective engagement produced ventricularization and pressure damping with reproduction of symptoms that the patient has clinically.  75-80% calcified mid LAD stenosis and 90+ percent stenosis in the second diagonal.  Moderate first and second obtuse marginal stenoses.  Dominant right coronary with eccentric 40-50% mid vessel narrowing.  Left ventricular global hypokinesis with estimated EF 35-40%.  Elevated end-diastolic pressure consistent with chronic combined systolic and diastolic heart failure. LV dysfunction may be out of proportion for the coronary disease identified.    Dr. Nils Pyle was consulted to consider revascularization.  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  He reports no history of stroke or TIA in the past.  No amaurosis, no weakness in extremities, and no aphasia.    Past medical history includes: type 2 diabetes mellitus ( A1c 7.1, not on insulin), obstructive sleep apnea, essential hypertension, hyperlipidemia with family history of of CAD       Current Facility-Administered Medications  Medication Dose Route Frequency Provider Last Rate Last Dose  . 0.9 %  sodium chloride infusion  250 mL Intravenous PRN Belva Crome, MD      . acetaminophen (TYLENOL) tablet 650 mg  650 mg Oral Q4H PRN Belva Crome, MD   650 mg at 06/24/17 1929  . aspirin EC tablet 81 mg  81 mg Oral Daily Belva Crome, MD   81 mg at 06/26/17 5621  . bisacodyl (DULCOLAX) EC tablet 5 mg  5 mg Oral  Once Prescott Gum, Collier Salina, MD      . carvedilol (COREG) tablet 6.25 mg  6.25 mg Oral BID WC Ledell Noss, MD   6.25 mg at 06/26/17 0914  . [START ON 06/27/2017] cefUROXime (ZINACEF) 1.5 g in dextrose 5 % 50 mL IVPB  1.5 g Intravenous To OR Belva Crome, MD      . Derrill Memo ON 06/27/2017] cefUROXime (ZINACEF) 750 mg in dextrose 5 % 50 mL IVPB  750 mg Intravenous To OR Belva Crome, MD      . chlorhexidine (HIBICLENS) 4 % liquid 4 application  60 mL Topical Once Prescott Gum, Collier Salina, MD       And  . Derrill Memo ON 06/27/2017] chlorhexidine (HIBICLENS) 4 % liquid 4 application  60 mL Topical Once Prescott Gum, Collier Salina, MD      . Derrill Memo ON 06/27/2017] chlorhexidine (PERIDEX) 0.12 % solution 15 mL  15 mL Mouth/Throat Once Prescott Gum, Collier Salina, MD      . Derrill Memo ON 06/27/2017] dexmedetomidine (PRECEDEX) 400 MCG/100ML (4 mcg/mL) infusion  0.1-0.7 mcg/kg/hr Intravenous To OR Prescott Gum, Collier Salina, MD      . Derrill Memo ON 06/27/2017] diazepam (VALIUM) tablet 5 mg  5 mg Oral Once Prescott Gum, Collier Salina, MD      . Derrill Memo ON 06/27/2017] DOPamine (INTROPIN) 800 mg in dextrose 5 % 250 mL (3.2 mg/mL) infusion  0-10 mcg/kg/min Intravenous To OR Prescott Gum, Collier Salina, MD      . Derrill Memo ON 06/27/2017] EPINEPHrine (ADRENALIN) 4 mg in dextrose 5 %  250 mL (0.016 mg/mL) infusion  0-10 mcg/min Intravenous To OR Prescott Gum, Collier Salina, MD      . furosemide (LASIX) tablet 40 mg  40 mg Oral BID Belva Crome, MD   40 mg at 06/26/17 0915  . glimepiride (AMARYL) tablet 2 mg  2 mg Oral BID Belva Crome, MD   2 mg at 06/26/17 0915  . [START ON 06/27/2017] heparin 2,500 Units, papaverine 30 mg in electrolyte-148 (PLASMALYTE-148) 500 mL irrigation   Irrigation To OR Prescott Gum, Collier Salina, MD      . Derrill Memo ON 06/27/2017] heparin 30,000 units/NS 1000 mL solution for CELLSAVER   Other To OR Prescott Gum, Collier Salina, MD      . heparin injection 5,000 Units  5,000 Units Subcutaneous Q8H Prescott Gum, Collier Salina, MD   5,000 Units at 06/26/17 0506  . hydrALAZINE (APRESOLINE) injection 10 mg  10 mg Intravenous Q8H  PRN Johna Sheriff, MD   10 mg at 06/24/17 2032  . insulin aspart (novoLOG) injection 0-5 Units  0-5 Units Subcutaneous QHS Bhagat, Bhavinkumar, PA      . insulin aspart (novoLOG) injection 0-9 Units  0-9 Units Subcutaneous TID WC Bhagat, Bhavinkumar, PA   2 Units at 06/26/17 0921  . [START ON 06/27/2017] insulin regular (NOVOLIN R,HUMULIN R) 100 Units in sodium chloride 0.9 % 100 mL (1 Units/mL) infusion   Intravenous To OR Belva Crome, MD      . irbesartan Levy Sjogren) tablet 300 mg  300 mg Oral Daily Belva Crome, MD   300 mg at 06/26/17 0915  . isosorbide mononitrate (IMDUR) 24 hr tablet 15 mg  15 mg Oral Daily Ledell Noss, MD   15 mg at 06/26/17 0914  . [START ON 06/27/2017] magnesium sulfate (IV Push/IM) injection 40 mEq  40 mEq Other To OR Belva Crome, MD      . Derrill Memo ON 06/27/2017] metoprolol tartrate (LOPRESSOR) tablet 12.5 mg  12.5 mg Oral Once Prescott Gum, Collier Salina, MD      . morphine 2 MG/ML injection 2 mg  2 mg Intravenous Q2H PRN Isaiah Serge, NP      . Derrill Memo ON 06/27/2017] nitroGLYCERIN 50 mg in dextrose 5 % 250 mL (0.2 mg/mL) infusion  2-200 mcg/min Intravenous To OR Belva Crome, MD      . ondansetron [facility]) injection 4 mg  4 mg Intravenous Q6H PRN Belva Crome, MD      . oxyCODONE (Oxy IR/ROXICODONE) immediate release tablet 5-10 mg  5-10 mg Oral Q4H PRN Belva Crome, MD      . Derrill Memo ON 06/27/2017] phenylephrine (NEO-SYNEPHRINE) 20 mg in sodium chloride 0.9 % 250 mL (0.08 mg/mL) infusion  30-200 mcg/min Intravenous To OR Belva Crome, MD      . Derrill Memo ON 06/27/2017] potassium chloride injection 80 mEq  80 mEq Other To OR Belva Crome, MD      . promethazine (PHENERGAN) injection 12.5 mg  12.5 mg Intravenous Q6H PRN Isaiah Serge, NP   12.5 mg at 06/24/17 2013  . simvastatin (ZOCOR) tablet 40 mg  40 mg Oral QHS Belva Crome, MD   40 mg at 06/25/17 2144  . sodium chloride flush (NS) 0.9 % injection 3 mL  3 mL Intravenous Q12H Belva Crome, MD   3 mL at 06/26/17 0921  .  sodium chloride flush (NS) 0.9 % injection 3 mL  3 mL Intravenous PRN Belva Crome, MD      .  temazepam (RESTORIL) capsule 15 mg  15 mg Oral Once PRN Prescott Gum, Collier Salina, MD      . Derrill Memo ON 06/27/2017] tranexamic acid (CYKLOKAPRON) 2,500 mg in sodium chloride 0.9 % 250 mL (10 mg/mL) infusion  1.5 mg/kg/hr Intravenous To OR Prescott Gum, Collier Salina, MD      . Derrill Memo ON 06/27/2017] tranexamic acid (CYKLOKAPRON) bolus via infusion - over 30 minutes 1,728 mg  15 mg/kg Intravenous To OR Belva Crome, MD      . Derrill Memo ON 06/27/2017] tranexamic acid (CYKLOKAPRON) pump prime solution 230 mg  2 mg/kg Intracatheter To OR Belva Crome, MD      . Derrill Memo ON 06/27/2017] vancomycin (VANCOCIN) 1,500 mg in sodium chloride 0.9 % 250 mL IVPB  1,500 mg Intravenous To OR Belva Crome, MD        Pt meds include: Statin :Yes Betablocker: No ASA: Yes Other anticoagulants/antiplatelets: no   Past Medical History:  Diagnosis Date  . Cataracts, bilateral    removed  . Hyperlipemia   . Prostate CA ([facility])   . Sleep apnea    Dr Ledora Bottcher ; uses CPAP  . Type II or unspecified type diabetes mellitus without mention of complication, not stated as uncontrolled   . Unspecified essential hypertension     Past Surgical History:  Procedure Laterality Date  . APPENDECTOMY    . CATARACT EXTRACTION, BILATERAL    . PROSTATECTOMY      Social History Social History   Tobacco Use  . Smoking status: Never Smoker  . Smokeless tobacco: Never Used  Substance Use Topics  . Alcohol use: No    Alcohol/week: 0.0 oz  . Drug use: No    Family History Family History  Problem Relation Age of Onset  . Heart disease Mother   . Diabetes Unknown   . Hypertension Unknown   . Colon cancer Neg Hx     Allergies  Allergen Reactions  . Amlodipine     Leg swelling  . Clindamycin Hcl   . Verapamil     Edema w/high dose     REVIEW OF SYSTEMS  General: [ ]  Weight loss, [ ]  Fever, [ ]  chills Neurologic: [ ]  Dizziness, [ ]   Blackouts, [ ]  Seizure [ ]  Stroke, [ ]  "Mini stroke", [ ]  Slurred speech, [ ]  Temporary blindness; [ ]  weakness in arms or legs, [ ]  Hoarseness [ ]  Dysphagia Cardiac: [ ]  Chest pain/pressure, [ ]  Shortness of breath at rest [x ] Shortness of breath with exertion, [ ]  Atrial fibrillation or irregular heartbeat  Vascular: [ ]  Pain in legs with walking, [ ]  Pain in legs at rest, [ ]  Pain in legs at night,  [ ]  Non-healing ulcer, [ ]  Blood clot in vein/DVT,   Pulmonary: [ ]  Home oxygen, [ ]  Productive cough, [ ]  Coughing up blood, [ ]  Asthma,  [ ]  Wheezing [ ]  COPD Musculoskeletal:  [ ]  Arthritis, [ ]  Low back pain, [ ]  Joint pain Hematologic: [ ]  Easy Bruising, [ ]  Anemia; [ ]  Hepatitis Gastrointestinal: [ ]  Blood in stool, [ ]  Gastroesophageal Reflux/heartburn, Urinary: [ ]  chronic Kidney disease, [ ]  on HD - [ ]  MWF or [ ]  TTHS, [ ]  Burning with urination, [ ]  Difficulty urinating Skin: [ ]  Rashes, [ ]  Wounds Psychological: [ ]  Anxiety, [ ]  Depression  Physical Examination Vitals:   06/25/17 2306 06/26/17 0349 06/26/17 0810 06/26/17 1232  BP: (!) 125/58 138/61 (!) 170/66   Pulse:  64 70    Resp: 11 12 15    Temp: 98 F (36.7 C) 98.4 F (36.9 C) 98.4 F (36.9 C) 98.5 F (36.9 C)  TempSrc: Oral Oral Oral Oral  SpO2: 97% 97% 96% 100%  Weight:      Height:       Body mass index is 34.45 kg/m.  General:  WDWN in NAD HENT: WNL Eyes: Pupils equal Pulmonary: normal non-labored breathing , without Rales, rhonchi,  wheezing Cardiac: RRR, without  Murmurs No carotid bruits Abdomen: soft, NT, no masses Skin: no rashes, ulcers noted;  no Gangrene , no cellulitis; no open wounds;   Vascular Exam/Pulses:Palpable radial pulses B ( in the middle of heart echo at bedside)   Musculoskeletal: no muscle wasting or atrophy; positive edema B LE  Neurologic: A&O X 3; Appropriate Affect ;  SENSATION: normal; MOTOR FUNCTION: 5/5 Symmetric Speech is fluent/normal   Significant Diagnostic  Studies: CBC Lab Results  Component Value Date   WBC 10.2 06/26/2017   HGB 12.7 (L) 06/26/2017   HCT 39.7 06/26/2017   MCV 91.5 06/26/2017   PLT 237 06/26/2017    BMET    Component Value Date/Time   NA 138 06/26/2017 0207   K 3.4 (L) 06/26/2017 0207   CL 102 06/26/2017 0207   CO2 27 06/26/2017 0207   GLUCOSE 173 (H) 06/26/2017 0207   BUN 19 06/26/2017 0207   CREATININE 1.42 (H) 06/26/2017 0207   CALCIUM 8.4 (L) 06/26/2017 0207   GFRNONAA 47 (L) 06/26/2017 0207   GFRAA 54 (L) 06/26/2017 0207   Estimated Creatinine Clearance: 58.9 mL/min (A) (by C-G formula based on SCr of 1.42 mg/dL (H)).  COAG Lab Results  Component Value Date   INR 1.05 06/25/2017   INR 0.97 06/24/2017     Non-Invasive Vascular Imaging:  Pending carotid duplex Message received that there is 90% carotid stenosis  ASSESSMENT/PLAN:  Carotid stenosis Patient was found to have a 70% left main stenosis with three-vessel CAD.  Ejection fraction was moderately impaired at 45%.  LVEDP was elevated at 26 mmHg. Dr. Nils Pyle plans CABG tomorrow.  Dr. Trula Slade will review the doppler study and discuss CEA plan for tomorrow as well based on his accessment.       Laurence Slate [facility] 06/26/2017 12:57 PM     Carotid Findings: Right - 40% to 59% ICA stenosis. Left 80% to 99% ICA stenosis. Bilateral - Vertebral flow is antegrade.  Upper Extremity Right Left  Brachial Pressures 153 Triphasic 147 Triphasic  Radial Waveforms Triphasic Triphasic  Ulnar Waveforms Triphasic Triphasic  Palmar Arch (Allen's Test) Normal Normal   Findings:  Doppler waveforms remained normal bilaterally with both radial and ulnar compressions.    Lower  Extremity Right Left  Dorsalis Pedis 129 Triphasic 175 Triphasic  Posterior Tibial 136 Triphasic 180 Triphasic  Ankle/Brachial Indices 0.89 1.18    Findings:  ABIs indicate a mild reduction in arterial flow on the right at rest. Left ABIs indicate normal arterial  flow at rest. TBIs are normal bilaterally.   I agree with the above.  I have seen and examined the patient.  He is scheduled for CABG tomorrow.  His preoperative carotid duplex shows a high-grade left carotid stenosis.  I have reviewed the images.  This appears to be in the midportion of the internal carotid artery.  I am concerned about the distal extent of the lesion.  I contemplated a CT angiogram to define his anatomy, however he has baseline renal  insufficiency and I would prefer not to give him contrast the night prior to his CABG.  He is asymptomatic.  He denies any neurologic symptoms.  Therefore, we have agreed to proceed with CABG tomorrow and address his carotid stenosis once he recovers from his surgery.  While he is in the hospital assuming his creatinine remains stable, I will try to get a CT angiogram.  He does report some nausea after contrast and therefore he will need to be premedicated.  I will try to do this prior to discharge.  We discussed the possibility of endarterectomy versus stenting, depending on his anatomy.  He understands the reasons for proceeding with CABG with interval endarterectomy versus stenting.  All of his questions were answered.  This was also discussed with Dr. Darcey Nora.  [name]

## 2017-06-26 NOTE — Progress Notes (Signed)
  Echocardiogram 2D Echocardiogram has been performed.  Larry [name] 06/26/2017, 1:35 PM

## 2017-06-26 NOTE — Progress Notes (Signed)
Progress Note  Patient Name: Larry [name] Date of Encounter: 06/26/2017  Primary Cardiologist: Dr. Tamala Julian   Subjective   Feeling well today, free of chest pain, difficulty breathing, or headache  Inpatient Medications    Scheduled Meds: . aspirin EC  81 mg Oral Daily  . carvedilol  6.25 mg Oral BID WC  . furosemide  40 mg Oral BID  . glimepiride  2 mg Oral BID  . heparin  5,000 Units Subcutaneous Q8H  . insulin aspart  0-5 Units Subcutaneous QHS  . insulin aspart  0-9 Units Subcutaneous TID WC  . irbesartan  300 mg Oral Daily  . isosorbide mononitrate  15 mg Oral Daily  . simvastatin  40 mg Oral QHS  . sodium chloride flush  3 mL Intravenous Q12H   Continuous Infusions: . sodium chloride     PRN Meds: sodium chloride, acetaminophen, hydrALAZINE, morphine injection, ondansetron (ZOFRAN) IV, oxyCODONE, promethazine, sodium chloride flush   Vital Signs    Vitals:   06/25/17 1601 06/25/17 1935 06/25/17 2306 06/26/17 0349  BP: 137/71 114/62 (!) 125/58 138/61  Pulse: 80 64 64 70  Resp: 10 19 11 12   Temp: 98.4 F (36.9 C) 97.8 F (36.6 C) 98 F (36.7 C) 98.4 F (36.9 C)  TempSrc: Oral Oral Oral Oral  SpO2:  97% 97% 97%  Weight:      Height:        Intake/Output Summary (Last 24 hours) at 06/26/2017 0706 Last data filed at 06/26/2017 0355 Gross per 24 hour  Intake 662 ml  Output 450 ml  Net 212 ml   Filed Weights   06/24/17 1204  Weight: 254 lb (115.2 kg)    Telemetry    Sinus rhythm - Personally Reviewed  Physical Exam   GEN: Sleeping comfortably, No acute distress.   Neck: No JVD Cardiac: RRR, no murmurs, rubs, or gallops.  Respiratory: Clear to auscultation bilaterally. GI: Soft, nontender, non-distended  MS: 1+ lower extremity pitting edema; No deformity. Neuro:  Nonfocal  Psych: Normal affect   Labs    Chemistry Recent Labs  Lab 06/24/17 1724 06/26/17 0207  NA  --  138  K  --  3.4*  CL  --  102  CO2  --  27  GLUCOSE  --  173*    BUN  --  19  CREATININE 1.21 1.42*  CALCIUM  --  8.4*  GFRNONAA 57* 47*  GFRAA >60 54*  ANIONGAP  --  9     Hematology Recent Labs  Lab 06/24/17 1724 06/26/17 0207  WBC 11.5* 10.2  RBC 4.69 4.34  HGB 14.1 12.7*  HCT 42.9 39.7  MCV 91.5 91.5  MCH 30.1 29.3  MCHC 32.9 32.0  RDW 13.6 13.5  PLT 261 237    Cardiac EnzymesNo results for input(s): TROPONINI in the last 168 hours. No results for input(s): TROPIPOC in the last 168 hours.   BNPNo results for input(s): BNP, PROBNP in the last 168 hours.   DDimer No results for input(s): DDIMER in the last 168 hours.   Radiology    No results found.  Cardiac Studies   76 y.o. male with history of type 2 diabetes mellitus ( A1c 7.1, not on insulin), obstructive sleep apnea, essential hypertension, hyperlipidemia with family history of of CAD. He presented to Dr. Thompson Caul office on 10/31 after referral from Dr. Amalia Greenhouse for evaluation of a > 6 month history of exertional chest tightness associated with dyspnea and fatigue which  relieve with rest. Yesterday he was taken for a scheduled cath yesterday which revealed left main disease and 75-80 % with three vessel disease. TCTS has evaluated and they are preparing for CABG.   Patient Profile     76 y.o. male with history of type 2 diabetes mellitus ( A1c 7.1, not on insulin), obstructive sleep apnea, essential hypertension, hyperlipidemia with family history of of CAD. He presented to Dr. Thompson Caul office on 10/31 after referral from Dr. Amalia Greenhouse for evaluation of a > 6 month history of exertional chest tightness associated with dyspnea and fatigue which relieve with rest. Yesterday he was taken for a scheduled cath yesterday which revealed left main disease and 75-80 % with three vessel disease. CT surgery evaluated yesterday and are preparing for CABG.   Assessment & Plan    Coronary artery disease - Three vessel CAD found by cath, he is awaiting CABG later this week. At this time he is  being managed medically with aspirin, simvastatin 40 mg qd, coreg 6.25 BID, isosorbide mononitrate 15 mg qd and irbeseartan 300 mg daily. Blood pressure and heart rate are under better control today. May be able to increase isosorbide mononitrate today.   Chronic combined congestive heart failure - EF 35-40 % with elevated end diastolic pressure on cath. On po Lasix 40 mg BID. Awaiting formal echo results.   Hypertension - Blood pressure is controlled today on irbesartan 300 mg qd, lasix 40 mg po BiD, and coreg 6.25 mg BID. Continue to monitor.   Hyperlipidemia - on simvastatin 40 mg qd   Morbid obesity - beginning stages of cardiac rehab  Hypokalemia - repleted potassium   Type 2 Diabetes Mellitus - Blood glucose has not been controlled on glimepiride so sensitive sliding scale has been initiated.   For questions or updates, please contact [place] Please consult [email or web address] for contact info under Cardiology/STEMI.      Signed, Ledell Noss, MD  06/26/2017, 7:06 AM     Patient seen and examined. Agree with assessment and plan. Recurrent chest pain. Tolerating initiation of low-dose nitrates.  Will increase to 30 mg. Echo shows EF 40-45% with mild concentric LVH.  There is hypokinesis of the anteroseptal, anterior and anterolateral wall.  There is grade 1 diastolic dysfunction with increased increased LV filling pressures.  Back from carotid imaging; formal result pending, but he was told of a blockage in his left carotid.  Tentatively on the schedule for CABG revascularization tomorrow with Dr. Nils Pyle. Troy Sine, MD, [facility] 06/26/2017 3:22 PM

## 2017-06-26 NOTE — Progress Notes (Signed)
CARDIAC REHAB PHASE I   PRE:  Rate/Rhythm: 71 SR    BP: sitting 131/55    SaO2: 97 RA  MODE:  Ambulation: 430 ft   POST:  Rate/Rhythm: 90 SR    BP: sitting 143/63     SaO2: 95 RA  Tolerated well, no c/o. Answered family questions. Pt ready for surgery. [place], ACSM 06/26/2017 2:45 PM

## 2017-06-27 ENCOUNTER — Inpatient Hospital Stay (HOSPITAL_COMMUNITY): Payer: Medicare Other | Admitting: Certified Registered"

## 2017-06-27 ENCOUNTER — Inpatient Hospital Stay (HOSPITAL_COMMUNITY)
Admission: RE | Disposition: A | Discharge status: Home Health | Payer: Self-pay | Source: Ambulatory Visit | Attending: Cardiothoracic Surgery

## 2017-06-27 ENCOUNTER — Ambulatory Visit (HOSPITAL_COMMUNITY): Payer: Medicare Other

## 2017-06-27 ENCOUNTER — Inpatient Hospital Stay (HOSPITAL_COMMUNITY): Payer: Medicare Other

## 2017-06-27 DIAGNOSIS — I25110 Atherosclerotic heart disease of native coronary artery with unstable angina pectoris: Secondary | ICD-10-CM

## 2017-06-27 DIAGNOSIS — I25709 Atherosclerosis of coronary artery bypass graft(s), unspecified, with unspecified angina pectoris: Secondary | ICD-10-CM

## 2017-06-27 HISTORY — PX: CORONARY ARTERY BYPASS GRAFT: SHX141

## 2017-06-27 HISTORY — PX: CYSTOSCOPY: SHX5120

## 2017-06-27 HISTORY — PX: TEE WITHOUT CARDIOVERSION: SHX5443

## 2017-06-27 LAB — POCT I-STAT, CHEM 8
BUN: 17 mg/dL (ref 6–20)
BUN: 16 mg/dL (ref 6–20)
BUN: 16 mg/dL (ref 6–20)
BUN: 14 mg/dL (ref 6–20)
BUN: 14 mg/dL (ref 6–20)
BUN: 15 mg/dL (ref 6–20)
BUN: 15 mg/dL (ref 6–20)
CALCIUM ION: 1.14 mmol/L — AB (ref 1.15–1.40)
CALCIUM ION: 1.19 mmol/L (ref 1.15–1.40)
CHLORIDE: 98 mmol/L — AB (ref 101–111)
CHLORIDE: 100 mmol/L — AB (ref 101–111)
CHLORIDE: 103 mmol/L (ref 101–111)
CREATININE: 0.9 mg/dL (ref 0.61–1.24)
CREATININE: 0.8 mg/dL (ref 0.61–1.24)
CREATININE: 0.9 mg/dL (ref 0.61–1.24)
CREATININE: 1 mg/dL (ref 0.61–1.24)
Calcium, Ion: 1.15 mmol/L (ref 1.15–1.40)
Calcium, Ion: 0.92 mmol/L — ABNORMAL LOW (ref 1.15–1.40)
Calcium, Ion: 0.95 mmol/L — ABNORMAL LOW (ref 1.15–1.40)
Calcium, Ion: 1.04 mmol/L — ABNORMAL LOW (ref 1.15–1.40)
Calcium, Ion: 1.11 mmol/L — ABNORMAL LOW (ref 1.15–1.40)
Chloride: 101 mmol/L (ref 101–111)
Chloride: 100 mmol/L — ABNORMAL LOW (ref 101–111)
Chloride: 102 mmol/L (ref 101–111)
Chloride: 98 mmol/L — ABNORMAL LOW (ref 101–111)
Creatinine, Ser: 1 mg/dL (ref 0.61–1.24)
Creatinine, Ser: 1 mg/dL (ref 0.61–1.24)
Creatinine, Ser: 0.9 mg/dL (ref 0.61–1.24)
GLUCOSE: 184 mg/dL — AB (ref 65–99)
GLUCOSE: 123 mg/dL — AB (ref 65–99)
GLUCOSE: 183 mg/dL — AB (ref 65–99)
GLUCOSE: 185 mg/dL — AB (ref 65–99)
Glucose, Bld: 146 mg/dL — ABNORMAL HIGH (ref 65–99)
Glucose, Bld: 106 mg/dL — ABNORMAL HIGH (ref 65–99)
Glucose, Bld: 130 mg/dL — ABNORMAL HIGH (ref 65–99)
HCT: 37 % — ABNORMAL LOW (ref 39.0–52.0)
HCT: 32 % — ABNORMAL LOW (ref 39.0–52.0)
HEMATOCRIT: 34 % — AB (ref 39.0–52.0)
HEMATOCRIT: 34 % — AB (ref 39.0–52.0)
HEMATOCRIT: 29 % — AB (ref 39.0–52.0)
HEMATOCRIT: 25 % — AB (ref 39.0–52.0)
HEMATOCRIT: 26 % — AB (ref 39.0–52.0)
HEMOGLOBIN: 11.6 g/dL — AB (ref 13.0–17.0)
HEMOGLOBIN: 11.6 g/dL — AB (ref 13.0–17.0)
HEMOGLOBIN: 9.9 g/dL — AB (ref 13.0–17.0)
HEMOGLOBIN: 8.5 g/dL — AB (ref 13.0–17.0)
HEMOGLOBIN: 8.8 g/dL — AB (ref 13.0–17.0)
Hemoglobin: 12.6 g/dL — ABNORMAL LOW (ref 13.0–17.0)
Hemoglobin: 10.9 g/dL — ABNORMAL LOW (ref 13.0–17.0)
POTASSIUM: 3.5 mmol/L (ref 3.5–5.1)
POTASSIUM: 3.4 mmol/L — AB (ref 3.5–5.1)
POTASSIUM: 3.8 mmol/L (ref 3.5–5.1)
POTASSIUM: 3.6 mmol/L (ref 3.5–5.1)
POTASSIUM: 3.8 mmol/L (ref 3.5–5.1)
Potassium: 3.7 mmol/L (ref 3.5–5.1)
Potassium: 3.4 mmol/L — ABNORMAL LOW (ref 3.5–5.1)
SODIUM: 139 mmol/L (ref 135–145)
SODIUM: 140 mmol/L (ref 135–145)
SODIUM: 141 mmol/L (ref 135–145)
SODIUM: 141 mmol/L (ref 135–145)
Sodium: 139 mmol/L (ref 135–145)
Sodium: 139 mmol/L (ref 135–145)
Sodium: 140 mmol/L (ref 135–145)
TCO2: 32 mmol/L (ref 22–32)
TCO2: 30 mmol/L (ref 22–32)
TCO2: 30 mmol/L (ref 22–32)
TCO2: 30 mmol/L (ref 22–32)
TCO2: 36 mmol/L — ABNORMAL HIGH (ref 22–32)
TCO2: 29 mmol/L (ref 22–32)
TCO2: 24 mmol/L (ref 22–32)

## 2017-06-27 LAB — POCT I-STAT 3, ART BLOOD GAS (G3+)
ACID-BASE EXCESS: 8 mmol/L — AB (ref 0.0–2.0)
Acid-Base Excess: 4 mmol/L — ABNORMAL HIGH (ref 0.0–2.0)
Acid-base deficit: 2 mmol/L (ref 0.0–2.0)
BICARBONATE: 32.7 mmol/L — AB (ref 20.0–28.0)
BICARBONATE: 23.8 mmol/L (ref 20.0–28.0)
BICARBONATE: 24.6 mmol/L (ref 20.0–28.0)
BICARBONATE: 22.6 mmol/L (ref 20.0–28.0)
Bicarbonate: 28 mmol/L (ref 20.0–28.0)
O2 SAT: 97 %
O2 SAT: 95 %
O2 Saturation: 100 %
O2 Saturation: 100 %
O2 Saturation: 100 %
PCO2 ART: 38.5 mmHg (ref 32.0–48.0)
PCO2 ART: 34.1 mmHg (ref 32.0–48.0)
PCO2 ART: 41.5 mmHg (ref 32.0–48.0)
PCO2 ART: 39.7 mmHg (ref 32.0–48.0)
PH ART: 7.458 — AB (ref 7.350–7.450)
PH ART: 7.471 — AB (ref 7.350–7.450)
PO2 ART: 166 mmHg — AB (ref 83.0–108.0)
PO2 ART: 164 mmHg — AB (ref 83.0–108.0)
PO2 ART: 81 mmHg — AB (ref 83.0–108.0)
Patient temperature: 37.1
Patient temperature: 37.4
TCO2: 34 mmol/L — ABNORMAL HIGH (ref 22–32)
TCO2: 29 mmol/L (ref 22–32)
TCO2: 25 mmol/L (ref 22–32)
TCO2: 26 mmol/L (ref 22–32)
TCO2: 24 mmol/L (ref 22–32)
pCO2 arterial: 46.1 mmHg (ref 32.0–48.0)
pH, Arterial: 7.45 (ref 7.350–7.450)
pH, Arterial: 7.383 (ref 7.350–7.450)
pH, Arterial: 7.365 (ref 7.350–7.450)
pO2, Arterial: 365 mmHg — ABNORMAL HIGH (ref 83.0–108.0)
pO2, Arterial: 91 mmHg (ref 83.0–108.0)

## 2017-06-27 LAB — SURGICAL PCR SCREEN
MRSA, PCR: NEGATIVE
STAPHYLOCOCCUS AUREUS: NEGATIVE

## 2017-06-27 LAB — BASIC METABOLIC PANEL
ANION GAP: 7 (ref 5–15)
BUN: 16 mg/dL (ref 6–20)
CHLORIDE: 102 mmol/L (ref 101–111)
CO2: 30 mmol/L (ref 22–32)
Calcium: 8.5 mg/dL — ABNORMAL LOW (ref 8.9–10.3)
Creatinine, Ser: 1.22 mg/dL (ref 0.61–1.24)
GFR calc non Af Amer: 56 mL/min — ABNORMAL LOW (ref >60)
GLUCOSE: 186 mg/dL — AB (ref 65–99)
POTASSIUM: 3.6 mmol/L (ref 3.5–5.1)
Sodium: 139 mmol/L (ref 135–145)

## 2017-06-27 LAB — CBC
HCT: 40.4 % (ref 39.0–52.0)
HCT: 34 % — ABNORMAL LOW (ref 39.0–52.0)
HCT: 33.2 % — ABNORMAL LOW (ref 39.0–52.0)
Hemoglobin: 13.2 g/dL (ref 13.0–17.0)
Hemoglobin: 11.2 g/dL — ABNORMAL LOW (ref 13.0–17.0)
Hemoglobin: 11 g/dL — ABNORMAL LOW (ref 13.0–17.0)
MCH: 30.1 pg (ref 26.0–34.0)
MCH: 29.9 pg (ref 26.0–34.0)
MCH: 29.9 pg (ref 26.0–34.0)
MCHC: 32.7 g/dL (ref 30.0–36.0)
MCHC: 32.9 g/dL (ref 30.0–36.0)
MCHC: 33.1 g/dL (ref 30.0–36.0)
MCV: 92 fL (ref 78.0–100.0)
MCV: 90.9 fL (ref 78.0–100.0)
MCV: 90.2 fL (ref 78.0–100.0)
PLATELETS: 203 10*3/uL (ref 150–400)
Platelets: 252 10*3/uL (ref 150–400)
Platelets: 193 10*3/uL (ref 150–400)
RBC: 4.39 MIL/uL (ref 4.22–5.81)
RBC: 3.74 MIL/uL — AB (ref 4.22–5.81)
RBC: 3.68 MIL/uL — ABNORMAL LOW (ref 4.22–5.81)
RDW: 13.5 % (ref 11.5–15.5)
RDW: 13 % (ref 11.5–15.5)
RDW: 13.1 % (ref 11.5–15.5)
WBC: 10 10*3/uL (ref 4.0–10.5)
WBC: 18.1 10*3/uL — AB (ref 4.0–10.5)
WBC: 15.7 10*3/uL — ABNORMAL HIGH (ref 4.0–10.5)

## 2017-06-27 LAB — GLUCOSE, CAPILLARY
GLUCOSE-CAPILLARY: 129 mg/dL — AB (ref 65–99)
GLUCOSE-CAPILLARY: 96 mg/dL (ref 65–99)
Glucose-Capillary: 138 mg/dL — ABNORMAL HIGH (ref 65–99)
Glucose-Capillary: 122 mg/dL — ABNORMAL HIGH (ref 65–99)
Glucose-Capillary: 154 mg/dL — ABNORMAL HIGH (ref 65–99)
Glucose-Capillary: 166 mg/dL — ABNORMAL HIGH (ref 65–99)

## 2017-06-27 LAB — CREATININE, SERUM
Creatinine, Ser: 1.09 mg/dL (ref 0.61–1.24)
GFR calc Af Amer: >60 mL/min (ref >60)
GFR calc non Af Amer: >60 mL/min (ref >60)

## 2017-06-27 LAB — PROTIME-INR
INR: 1.32
PROTHROMBIN TIME: 16.2 s — AB (ref 11.4–15.2)

## 2017-06-27 LAB — HEMOGLOBIN AND HEMATOCRIT, BLOOD
HCT: 26.7 % — ABNORMAL LOW (ref 39.0–52.0)
Hemoglobin: 9.1 g/dL — ABNORMAL LOW (ref 13.0–17.0)

## 2017-06-27 LAB — PLATELET COUNT: Platelets: 174 10*3/uL (ref 150–400)

## 2017-06-27 LAB — APTT: APTT: 33 s (ref 24–36)

## 2017-06-27 LAB — MAGNESIUM: Magnesium: 2.6 mg/dL — ABNORMAL HIGH (ref 1.7–2.4)

## 2017-06-27 SURGERY — CORONARY ARTERY BYPASS GRAFTING (CABG)
Anesthesia: General | Site: Penis

## 2017-06-27 MED ORDER — MORPHINE SULFATE (PF) 4 MG/ML IV SOLN
2.0000 mg | INTRAVENOUS | Status: DC | PRN
  Administered 2017-06-27: 1 mg via INTRAVENOUS
  Administered 2017-06-27: 2 mg via INTRAVENOUS
  Administered 2017-06-27 – 2017-06-28 (×3): 4 mg via INTRAVENOUS
  Administered 2017-06-28 – 2017-06-29 (×2): 2 mg via INTRAVENOUS
  Filled 2017-06-27 (×5): qty 1

## 2017-06-27 MED ORDER — HEPARIN SODIUM (PORCINE) 1000 UNIT/ML IJ SOLN
INTRAMUSCULAR | Status: AC
  Filled 2017-06-27: qty 1

## 2017-06-27 MED ORDER — DOCUSATE SODIUM 100 MG PO CAPS
200.0000 mg | ORAL_CAPSULE | Freq: Every day | ORAL | Status: DC
  Administered 2017-06-28 – 2017-07-01 (×4): 200 mg via ORAL
  Filled 2017-06-27 (×5): qty 2

## 2017-06-27 MED ORDER — MIDAZOLAM HCL 2 MG/2ML IJ SOLN: 2.0000 mg | INTRAMUSCULAR | Status: DC | PRN

## 2017-06-27 MED ORDER — FENTANYL CITRATE (PF) 250 MCG/5ML IJ SOLN
INTRAMUSCULAR | Status: AC
  Filled 2017-06-27: qty 25

## 2017-06-27 MED ORDER — METOCLOPRAMIDE HCL 5 MG/ML IJ SOLN
10.0000 mg | Freq: Four times a day (QID) | INTRAMUSCULAR | Status: DC
  Administered 2017-06-27 – 2017-07-01 (×15): 10 mg via INTRAVENOUS
  Filled 2017-06-27 (×14): qty 2

## 2017-06-27 MED ORDER — SODIUM CHLORIDE 0.9% FLUSH
3.0000 mL | INTRAVENOUS | Status: DC | PRN
  Administered 2017-06-30 – 2017-07-01 (×2): 3 mL via INTRAVENOUS
  Filled 2017-06-27 (×2): qty 3

## 2017-06-27 MED ORDER — ARTIFICIAL TEARS OPHTHALMIC OINT
TOPICAL_OINTMENT | OPHTHALMIC | Status: DC | PRN
  Administered 2017-06-27: 1 via OPHTHALMIC

## 2017-06-27 MED ORDER — POTASSIUM CHLORIDE 10 MEQ/50ML IV SOLN
10.0000 meq | INTRAVENOUS | Status: AC
  Administered 2017-06-27 (×3): 10 meq via INTRAVENOUS
  Filled 2017-06-27: qty 50

## 2017-06-27 MED ORDER — SODIUM CHLORIDE 0.9 % IV SOLN
INTRAVENOUS | Status: DC | PRN
  Administered 2017-06-27: 14:00:00 via INTRAVENOUS

## 2017-06-27 MED ORDER — ACETAMINOPHEN 650 MG RE SUPP
650.0000 mg | Freq: Once | RECTAL | Status: AC
  Administered 2017-06-27: 650 mg via RECTAL

## 2017-06-27 MED ORDER — SODIUM CHLORIDE 0.9% FLUSH
10.0000 mL | Freq: Two times a day (BID) | INTRAVENOUS | Status: DC
  Administered 2017-06-28 – 2017-06-30 (×5): 10 mL

## 2017-06-27 MED ORDER — PANTOPRAZOLE SODIUM 40 MG PO TBEC
40.0000 mg | DELAYED_RELEASE_TABLET | Freq: Every day | ORAL | Status: DC
  Administered 2017-06-29 – 2017-07-03 (×5): 40 mg via ORAL
  Filled 2017-06-27 (×5): qty 1

## 2017-06-27 MED ORDER — TAMSULOSIN HCL 0.4 MG PO CAPS
0.4000 mg | ORAL_CAPSULE | Freq: Every day | ORAL | Status: DC
  Administered 2017-06-28 – 2017-07-03 (×6): 0.4 mg via ORAL
  Filled 2017-06-27 (×6): qty 1

## 2017-06-27 MED ORDER — MILRINONE LACTATE IN DEXTROSE 20-5 MG/100ML-% IV SOLN
0.3750 ug/kg/min | INTRAVENOUS | Status: DC
  Filled 2017-06-27: qty 100

## 2017-06-27 MED ORDER — SUCCINYLCHOLINE CHLORIDE 200 MG/10ML IV SOSY
PREFILLED_SYRINGE | INTRAVENOUS | Status: AC
  Filled 2017-06-27: qty 10

## 2017-06-27 MED ORDER — ACETAMINOPHEN 500 MG PO TABS
1000.0000 mg | ORAL_TABLET | Freq: Four times a day (QID) | ORAL | Status: AC
  Administered 2017-06-28 – 2017-07-02 (×19): 1000 mg via ORAL
  Filled 2017-06-27 (×18): qty 2

## 2017-06-27 MED ORDER — CHLORHEXIDINE GLUCONATE 0.12 % MT SOLN
15.0000 mL | OROMUCOSAL | Status: AC
  Administered 2017-06-27: 15 mL via OROMUCOSAL

## 2017-06-27 MED ORDER — TRANEXAMIC ACID 1000 MG/10ML IV SOLN
1000.0000 mg | INTRAVENOUS | Status: DC
  Filled 2017-06-27: qty 10

## 2017-06-27 MED ORDER — FENTANYL CITRATE (PF) 100 MCG/2ML IJ SOLN
INTRAMUSCULAR | Status: DC | PRN
Start: 2017-06-27 — End: 2017-06-27
  Administered 2017-06-27 (×2): 100 ug via INTRAVENOUS
  Administered 2017-06-27: 25 ug via INTRAVENOUS
  Administered 2017-06-27 (×2): 150 ug via INTRAVENOUS
  Administered 2017-06-27 (×2): 50 ug via INTRAVENOUS
  Administered 2017-06-27: 100 ug via INTRAVENOUS
  Administered 2017-06-27: 50 ug via INTRAVENOUS
  Administered 2017-06-27: 200 ug via INTRAVENOUS
  Administered 2017-06-27: 100 ug via INTRAVENOUS
  Administered 2017-06-27: 50 ug via INTRAVENOUS
  Administered 2017-06-27 (×4): 100 ug via INTRAVENOUS
  Administered 2017-06-27: 50 ug via INTRAVENOUS

## 2017-06-27 MED ORDER — SODIUM CHLORIDE 0.9 % IV SOLN: INTRAVENOUS | Status: DC

## 2017-06-27 MED ORDER — NITROGLYCERIN IN D5W 200-5 MCG/ML-% IV SOLN
INTRAVENOUS | Status: DC | PRN
  Administered 2017-06-27: .01 mg via INTRAVENOUS
  Administered 2017-06-27: .02 mg via INTRAVENOUS

## 2017-06-27 MED ORDER — LACTATED RINGERS IV SOLN: INTRAVENOUS | Status: DC

## 2017-06-27 MED ORDER — MAGNESIUM SULFATE 4 GM/100ML IV SOLN
4.0000 g | Freq: Once | INTRAVENOUS | Status: AC
  Administered 2017-06-27: 4 g via INTRAVENOUS
  Filled 2017-06-27: qty 100

## 2017-06-27 MED ORDER — 0.9 % SODIUM CHLORIDE (POUR BTL) OPTIME
TOPICAL | Status: DC | PRN
  Administered 2017-06-27: 6000 mL

## 2017-06-27 MED ORDER — HEMOSTATIC AGENTS (NO CHARGE) OPTIME
TOPICAL | Status: DC | PRN
  Administered 2017-06-27: 1 via TOPICAL

## 2017-06-27 MED ORDER — VANCOMYCIN HCL 10 G IV SOLR: 1500.0000 mg | INTRAVENOUS | Status: DC

## 2017-06-27 MED ORDER — CEFUROXIME SODIUM 1.5 G IV SOLR: 1.5000 g | INTRAVENOUS | Status: DC

## 2017-06-27 MED ORDER — ACETAMINOPHEN 160 MG/5ML PO SOLN: 650.0000 mg | Freq: Once | ORAL | Status: AC

## 2017-06-27 MED ORDER — FAMOTIDINE IN NACL 20-0.9 MG/50ML-% IV SOLN
20.0000 mg | Freq: Two times a day (BID) | INTRAVENOUS | Status: DC
  Administered 2017-06-27: 20 mg via INTRAVENOUS

## 2017-06-27 MED ORDER — PROPOFOL 10 MG/ML IV BOLUS
INTRAVENOUS | Status: DC | PRN
  Administered 2017-06-27: 60 mg via INTRAVENOUS
  Administered 2017-06-27: 100 mg via INTRAVENOUS

## 2017-06-27 MED ORDER — POTASSIUM CHLORIDE 10 MEQ/50ML IV SOLN
10.0000 meq | Freq: Once | INTRAVENOUS | Status: AC
Start: 2017-06-27 — End: 2017-06-27
  Administered 2017-06-27: 10 meq via INTRAVENOUS

## 2017-06-27 MED ORDER — HEPARIN SODIUM (PORCINE) 1000 UNIT/ML IJ SOLN
INTRAMUSCULAR | Status: DC | PRN
  Administered 2017-06-27: 2000 [IU] via INTRAVENOUS
  Administered 2017-06-27: 33000 [IU] via INTRAVENOUS

## 2017-06-27 MED ORDER — SUCCINYLCHOLINE CHLORIDE 200 MG/10ML IV SOSY
PREFILLED_SYRINGE | INTRAVENOUS | Status: DC | PRN
  Administered 2017-06-27: 140 mg via INTRAVENOUS

## 2017-06-27 MED ORDER — SODIUM CHLORIDE 0.9 % IV SOLN
INTRAVENOUS | Status: DC | PRN
  Administered 2017-06-27: 1500 mg via INTRAVENOUS

## 2017-06-27 MED ORDER — PHENYLEPHRINE HCL 10 MG/ML IJ SOLN
INTRAVENOUS | Status: DC | PRN
  Administered 2017-06-27: 10 ug/min via INTRAVENOUS

## 2017-06-27 MED ORDER — ASPIRIN 81 MG PO CHEW
324.0000 mg | CHEWABLE_TABLET | Freq: Every day | ORAL | Status: DC
  Filled 2017-06-27: qty 4

## 2017-06-27 MED ORDER — BISACODYL 10 MG RE SUPP: 10.0000 mg | Freq: Every day | RECTAL | Status: DC

## 2017-06-27 MED ORDER — CHLORHEXIDINE GLUCONATE 0.12% ORAL RINSE (MEDLINE KIT): 15.0000 mL | Freq: Two times a day (BID) | OROMUCOSAL | Status: DC

## 2017-06-27 MED ORDER — MIDAZOLAM HCL 10 MG/2ML IJ SOLN
INTRAMUSCULAR | Status: AC
  Filled 2017-06-27: qty 2

## 2017-06-27 MED ORDER — LACTATED RINGERS IV SOLN
INTRAVENOUS | Status: DC | PRN
  Administered 2017-06-27 (×2): via INTRAVENOUS

## 2017-06-27 MED ORDER — PHENYLEPHRINE 40 MCG/ML (10ML) SYRINGE FOR IV PUSH (FOR BLOOD PRESSURE SUPPORT)
PREFILLED_SYRINGE | INTRAVENOUS | Status: AC
  Filled 2017-06-27: qty 20

## 2017-06-27 MED ORDER — FENTANYL CITRATE (PF) 250 MCG/5ML IJ SOLN
INTRAMUSCULAR | Status: AC
  Filled 2017-06-27: qty 5

## 2017-06-27 MED ORDER — PROPOFOL 10 MG/ML IV BOLUS
INTRAVENOUS | Status: AC
  Filled 2017-06-27: qty 20

## 2017-06-27 MED ORDER — THROMBIN (RECOMBINANT) 5000 UNITS EX SOLR
CUTANEOUS | Status: AC
  Filled 2017-06-27: qty 5000

## 2017-06-27 MED ORDER — DEXTROSE 5 % IV SOLN
1.5000 g | Freq: Two times a day (BID) | INTRAVENOUS | Status: AC
  Administered 2017-06-27 – 2017-06-29 (×4): 1.5 g via INTRAVENOUS
  Filled 2017-06-27 (×4): qty 1.5

## 2017-06-27 MED ORDER — SODIUM CHLORIDE 0.9 % IJ SOLN
OROMUCOSAL | Status: DC | PRN
  Administered 2017-06-27 (×3): via TOPICAL

## 2017-06-27 MED ORDER — ROCURONIUM BROMIDE 10 MG/ML (PF) SYRINGE
PREFILLED_SYRINGE | INTRAVENOUS | Status: DC | PRN
  Administered 2017-06-27: 30 mg via INTRAVENOUS
  Administered 2017-06-27: 20 mg via INTRAVENOUS
  Administered 2017-06-27: 80 mg via INTRAVENOUS
  Administered 2017-06-27: 20 mg via INTRAVENOUS
  Administered 2017-06-27: 50 mg via INTRAVENOUS

## 2017-06-27 MED ORDER — METOPROLOL TARTRATE 12.5 MG HALF TABLET
12.5000 mg | ORAL_TABLET | Freq: Two times a day (BID) | ORAL | Status: DC
  Administered 2017-06-28: 12.5 mg via ORAL
  Filled 2017-06-27: qty 1

## 2017-06-27 MED ORDER — THROMBIN (RECOMBINANT) 5000 UNITS EX SOLR
CUTANEOUS | Status: DC | PRN
  Administered 2017-06-27: 5000 [IU] via TOPICAL

## 2017-06-27 MED ORDER — ROCURONIUM BROMIDE 10 MG/ML (PF) SYRINGE
PREFILLED_SYRINGE | INTRAVENOUS | Status: AC
  Filled 2017-06-27: qty 10

## 2017-06-27 MED ORDER — LACTATED RINGERS IV SOLN
INTRAVENOUS | Status: DC | PRN
  Administered 2017-06-27 (×2): via INTRAVENOUS

## 2017-06-27 MED ORDER — ALBUMIN HUMAN 5 % IV SOLN
250.0000 mL | INTRAVENOUS | Status: AC | PRN
  Administered 2017-06-27 (×2): 250 mL via INTRAVENOUS

## 2017-06-27 MED ORDER — MILRINONE LACTATE IN DEXTROSE 20-5 MG/100ML-% IV SOLN
0.1250 ug/kg/min | INTRAVENOUS | Status: DC
  Administered 2017-06-28 – 2017-06-29 (×2): 0.125 ug/kg/min via INTRAVENOUS
  Filled 2017-06-27 (×3): qty 100

## 2017-06-27 MED ORDER — LACTATED RINGERS IV SOLN
INTRAVENOUS | Status: DC | PRN
  Administered 2017-06-27: 08:00:00 via INTRAVENOUS

## 2017-06-27 MED ORDER — MORPHINE SULFATE (PF) 4 MG/ML IV SOLN
1.0000 mg | INTRAVENOUS | Status: DC | PRN
  Filled 2017-06-27: qty 1

## 2017-06-27 MED ORDER — ROCURONIUM BROMIDE 10 MG/ML (PF) SYRINGE
PREFILLED_SYRINGE | INTRAVENOUS | Status: AC
  Filled 2017-06-27: qty 5

## 2017-06-27 MED ORDER — BISACODYL 5 MG PO TBEC
10.0000 mg | DELAYED_RELEASE_TABLET | Freq: Every day | ORAL | Status: DC
  Administered 2017-06-28 – 2017-07-03 (×5): 10 mg via ORAL
  Filled 2017-06-27 (×5): qty 2

## 2017-06-27 MED ORDER — ORAL CARE MOUTH RINSE: 15.0000 mL | OROMUCOSAL | Status: DC

## 2017-06-27 MED ORDER — TRAMADOL HCL 50 MG PO TABS
50.0000 mg | ORAL_TABLET | ORAL | Status: DC | PRN
  Administered 2017-06-28: 50 mg via ORAL
  Filled 2017-06-27: qty 1

## 2017-06-27 MED ORDER — SODIUM CHLORIDE 0.9% FLUSH
3.0000 mL | Freq: Two times a day (BID) | INTRAVENOUS | Status: DC
  Administered 2017-06-28 – 2017-06-29 (×3): 3 mL via INTRAVENOUS
  Administered 2017-06-29: 6 mL via INTRAVENOUS
  Administered 2017-06-30 – 2017-07-03 (×6): 3 mL via INTRAVENOUS

## 2017-06-27 MED ORDER — SODIUM CHLORIDE 0.45 % IV SOLN: INTRAVENOUS | Status: DC | PRN

## 2017-06-27 MED ORDER — OXYCODONE HCL 5 MG PO TABS
5.0000 mg | ORAL_TABLET | ORAL | Status: DC | PRN
  Administered 2017-06-28 (×2): 10 mg via ORAL
  Filled 2017-06-27 (×2): qty 2

## 2017-06-27 MED ORDER — ASPIRIN EC 325 MG PO TBEC
325.0000 mg | DELAYED_RELEASE_TABLET | Freq: Every day | ORAL | Status: DC
  Administered 2017-06-28 – 2017-07-03 (×6): 325 mg via ORAL
  Filled 2017-06-27 (×6): qty 1

## 2017-06-27 MED ORDER — MUPIROCIN 2 % EX OINT
TOPICAL_OINTMENT | Freq: Once | CUTANEOUS | Status: AC
  Administered 2017-06-27: 06:00:00 via NASAL
  Filled 2017-06-27: qty 22

## 2017-06-27 MED ORDER — NITROGLYCERIN IN D5W 200-5 MCG/ML-% IV SOLN: 0.0000 ug/min | INTRAVENOUS | Status: DC

## 2017-06-27 MED ORDER — METOPROLOL TARTRATE 25 MG/10 ML ORAL SUSPENSION: 12.5000 mg | Freq: Two times a day (BID) | ORAL | Status: DC

## 2017-06-27 MED ORDER — SODIUM CHLORIDE 0.9 % IV SOLN
0.0000 ug/kg/h | INTRAVENOUS | Status: DC
  Filled 2017-06-27: qty 2

## 2017-06-27 MED ORDER — SODIUM CHLORIDE 0.9 % IV SOLN
0.0000 ug/min | INTRAVENOUS | Status: DC
  Filled 2017-06-27 (×2): qty 2

## 2017-06-27 MED ORDER — VANCOMYCIN HCL IN DEXTROSE 1-5 GM/200ML-% IV SOLN
1000.0000 mg | Freq: Two times a day (BID) | INTRAVENOUS | Status: AC
  Administered 2017-06-27 – 2017-06-28 (×2): 1000 mg via INTRAVENOUS
  Filled 2017-06-27 (×2): qty 200

## 2017-06-27 MED ORDER — VANCOMYCIN HCL IN DEXTROSE 1-5 GM/200ML-% IV SOLN
1000.0000 mg | Freq: Once | INTRAVENOUS | Status: DC
  Filled 2017-06-27: qty 200

## 2017-06-27 MED ORDER — MIDAZOLAM HCL 5 MG/5ML IJ SOLN
INTRAMUSCULAR | Status: DC | PRN
  Administered 2017-06-27 (×2): 1 mg via INTRAVENOUS
  Administered 2017-06-27: 2 mg via INTRAVENOUS
  Administered 2017-06-27: 6 mg via INTRAVENOUS

## 2017-06-27 MED ORDER — DOPAMINE-DEXTROSE 3.2-5 MG/ML-% IV SOLN: 2.5000 ug/kg/min | INTRAVENOUS | Status: DC

## 2017-06-27 MED ORDER — LACTATED RINGERS IV SOLN: 500.0000 mL | Freq: Once | INTRAVENOUS | Status: DC | PRN

## 2017-06-27 MED ORDER — PROTAMINE SULFATE 10 MG/ML IV SOLN
INTRAVENOUS | Status: DC | PRN
  Administered 2017-06-27: 32 mg via INTRAVENOUS

## 2017-06-27 MED ORDER — INSULIN REGULAR BOLUS VIA INFUSION
0.0000 [IU] | Freq: Three times a day (TID) | INTRAVENOUS | Status: DC
  Filled 2017-06-27: qty 10

## 2017-06-27 MED ORDER — CHLORHEXIDINE GLUCONATE 0.12 % MT SOLN
15.0000 mL | Freq: Two times a day (BID) | OROMUCOSAL | Status: DC
  Administered 2017-06-27 – 2017-06-30 (×6): 15 mL via OROMUCOSAL
  Filled 2017-06-27 (×5): qty 15

## 2017-06-27 MED ORDER — ALBUMIN HUMAN 5 % IV SOLN
INTRAVENOUS | Status: DC | PRN
  Administered 2017-06-27 (×2): via INTRAVENOUS

## 2017-06-27 MED ORDER — SODIUM CHLORIDE 0.9% FLUSH: 10.0000 mL | INTRAVENOUS | Status: DC | PRN

## 2017-06-27 MED ORDER — ARTIFICIAL TEARS OPHTHALMIC OINT
TOPICAL_OINTMENT | OPHTHALMIC | Status: AC
  Filled 2017-06-27: qty 3.5

## 2017-06-27 MED ORDER — CHLORHEXIDINE GLUCONATE CLOTH 2 % EX PADS
6.0000 | MEDICATED_PAD | Freq: Every day | CUTANEOUS | Status: DC
  Administered 2017-06-27 – 2017-06-29 (×3): 6 via TOPICAL

## 2017-06-27 MED ORDER — METOPROLOL TARTRATE 5 MG/5ML IV SOLN
2.5000 mg | INTRAVENOUS | Status: DC | PRN
  Administered 2017-06-27 – 2017-06-28 (×2): 5 mg via INTRAVENOUS
  Administered 2017-06-28 (×2): 2.5 mg via INTRAVENOUS
  Administered 2017-07-03: 5 mg via INTRAVENOUS
  Filled 2017-06-27 (×3): qty 5

## 2017-06-27 MED ORDER — ORAL CARE MOUTH RINSE
15.0000 mL | Freq: Two times a day (BID) | OROMUCOSAL | Status: DC
  Administered 2017-06-28: 15 mL via OROMUCOSAL

## 2017-06-27 MED ORDER — DEXMEDETOMIDINE HCL IN NACL 200 MCG/50ML IV SOLN
INTRAVENOUS | Status: DC | PRN
  Administered 2017-06-27: 0.5 ug/kg/h
  Administered 2017-06-27: .5 ug/kg/h via INTRAVENOUS

## 2017-06-27 MED ORDER — ACETAMINOPHEN 160 MG/5ML PO SOLN: 1000.0000 mg | Freq: Four times a day (QID) | ORAL | Status: AC

## 2017-06-27 MED ORDER — ONDANSETRON HCL 4 MG/2ML IJ SOLN: 4.0000 mg | Freq: Four times a day (QID) | INTRAMUSCULAR | Status: DC | PRN

## 2017-06-27 MED ORDER — PHENYLEPHRINE 40 MCG/ML (10ML) SYRINGE FOR IV PUSH (FOR BLOOD PRESSURE SUPPORT)
PREFILLED_SYRINGE | INTRAVENOUS | Status: DC | PRN
  Administered 2017-06-27: 40 ug via INTRAVENOUS
  Administered 2017-06-27: 60 ug via INTRAVENOUS
  Administered 2017-06-27: 80 ug via INTRAVENOUS
  Administered 2017-06-27: 40 ug via INTRAVENOUS

## 2017-06-27 MED ORDER — SODIUM CHLORIDE 0.9 % IV SOLN: 250.0000 mL | INTRAVENOUS | Status: DC

## 2017-06-27 MED ORDER — SODIUM CHLORIDE 0.9 % IV SOLN
INTRAVENOUS | Status: DC
  Administered 2017-06-28: 4.5 [IU]/h via INTRAVENOUS
  Filled 2017-06-27: qty 1

## 2017-06-27 MED ORDER — MILRINONE LACTATE IN DEXTROSE 20-5 MG/100ML-% IV SOLN
INTRAVENOUS | Status: DC | PRN
  Administered 2017-06-27: 0.25 ug/kg/min via INTRAVENOUS

## 2017-06-27 SURGICAL SUPPLY — 135 items
ADAPTER CARDIO PERF ANTE/RETRO (ADAPTER) ×5 IMPLANT
ADH SKN CLS APL DERMABOND .7 (GAUZE/BANDAGES/DRESSINGS) ×3
ADPR PRFSN 84XANTGRD RTRGD (ADAPTER) ×3
BAG DECANTER FOR FLEXI CONT (MISCELLANEOUS) ×5 IMPLANT
BALLN NEPHROSTOMY (BALLOONS) ×5
BALLOON NEPHROSTOMY (BALLOONS) IMPLANT
BANDAGE ACE 4X5 VEL STRL LF (GAUZE/BANDAGES/DRESSINGS) ×5 IMPLANT
BANDAGE ACE 6X5 VEL STRL LF (GAUZE/BANDAGES/DRESSINGS) ×5 IMPLANT
BANDAGE ELASTIC 4 VELCRO ST LF (GAUZE/BANDAGES/DRESSINGS) ×2 IMPLANT
BASKET HEART  (ORDER IN 25'S) (MISCELLANEOUS) ×1
BASKET HEART (ORDER IN 25'S) (MISCELLANEOUS) ×1
BASKET HEART (ORDER IN 25S) (MISCELLANEOUS) ×3 IMPLANT
BLADE CLIPPER SURG (BLADE) ×2 IMPLANT
BLADE STERNUM SYSTEM 6 (BLADE) ×5 IMPLANT
BLADE SURG 11 STRL SS (BLADE) ×2 IMPLANT
BLADE SURG 12 STRL SS (BLADE) ×5 IMPLANT
BNDG GAUZE ELAST 4 BULKY (GAUZE/BANDAGES/DRESSINGS) ×5 IMPLANT
CANISTER SUCT 3000ML PPV (MISCELLANEOUS) ×5 IMPLANT
CANNULA ARTERIAL NVNT 3/8 22FR (MISCELLANEOUS) ×2 IMPLANT
CANNULA GUNDRY RCSP 15FR (MISCELLANEOUS) ×5 IMPLANT
CATH COUDE 5CC RIBBED (CATHETERS) IMPLANT
CATH COUDE FOLEY 2W 5CC 16FR (CATHETERS) ×2 IMPLANT
CATH COUDE FOLEY 2W 5CC 18FR (CATHETERS) ×2 IMPLANT
CATH COUDE FOLEY 5CC 14FR (CATHETERS) ×2 IMPLANT
CATH CPB KIT VANTRIGT (MISCELLANEOUS) ×5 IMPLANT
CATH FOLEY 16FR TEMP PROBE (CATHETERS) ×2 IMPLANT
CATH FOLEY 2W COUNCIL 5CC 16FR (CATHETERS) ×2 IMPLANT
CATH FOLEY 2WAY 5CC 16FR (CATHETERS) ×5
CATH FOLEY 2WAY SLVR  5CC 12FR (CATHETERS) ×2
CATH FOLEY 2WAY SLVR 5CC 12FR (CATHETERS) IMPLANT
CATH RIBBED COUDE 5CC (CATHETERS) ×5
CATH ROBINSON RED A/P 18FR (CATHETERS) ×15 IMPLANT
CATH THORACIC 36FR RT ANG (CATHETERS) ×5 IMPLANT
CATH TIEMANN FOLEY 18FR 5CC (CATHETERS) ×2 IMPLANT
CATH URTH STD 16FR FL 2W DRN (CATHETERS) IMPLANT
CLIP FOGARTY SPRING 6M (CLIP) ×4 IMPLANT
CLIP VESOCCLUDE SM WIDE 24/CT (CLIP) ×8 IMPLANT
CRADLE DONUT ADULT HEAD (MISCELLANEOUS) ×5 IMPLANT
DERMABOND ADVANCED (GAUZE/BANDAGES/DRESSINGS) ×2
DERMABOND ADVANCED .7 DNX12 (GAUZE/BANDAGES/DRESSINGS) IMPLANT
DRAIN CHANNEL 32F RND 10.7 FF (WOUND CARE) ×5 IMPLANT
DRAPE CARDIOVASCULAR INCISE (DRAPES) ×5
DRAPE SLUSH/WARMER DISC (DRAPES) ×5 IMPLANT
DRAPE SRG 135X102X78XABS (DRAPES) ×3 IMPLANT
DRSG AQUACEL AG ADV 3.5X14 (GAUZE/BANDAGES/DRESSINGS) ×5 IMPLANT
ELECT BLADE 4.0 EZ CLEAN MEGAD (MISCELLANEOUS) ×5
ELECT BLADE 6.5 EXT (BLADE) ×5 IMPLANT
ELECT CAUTERY BLADE 6.4 (BLADE) ×5 IMPLANT
ELECT REM PT RETURN 9FT ADLT (ELECTROSURGICAL) ×10
ELECTRODE BLDE 4.0 EZ CLN MEGD (MISCELLANEOUS) ×3 IMPLANT
ELECTRODE REM PT RTRN 9FT ADLT (ELECTROSURGICAL) ×6 IMPLANT
FELT TEFLON 1X6 (MISCELLANEOUS) ×8 IMPLANT
GAUZE SPONGE 4X4 12PLY STRL (GAUZE/BANDAGES/DRESSINGS) ×10 IMPLANT
GAUZE SPONGE 4X4 12PLY STRL LF (GAUZE/BANDAGES/DRESSINGS) ×4 IMPLANT
GLOVE BIO SURGEON STRL SZ 6 (GLOVE) ×10 IMPLANT
GLOVE BIO SURGEON STRL SZ7.5 (GLOVE) ×19 IMPLANT
GLOVE BIO SURGEON STRL SZ8 (GLOVE) ×4 IMPLANT
GLOVE BIOGEL PI IND STRL 6 (GLOVE) IMPLANT
GLOVE BIOGEL PI IND STRL 6.5 (GLOVE) IMPLANT
GLOVE BIOGEL PI IND STRL 8.5 (GLOVE) IMPLANT
GLOVE BIOGEL PI INDICATOR 6 (GLOVE) ×12
GLOVE BIOGEL PI INDICATOR 6.5 (GLOVE) ×14
GLOVE BIOGEL PI INDICATOR 8.5 (GLOVE) ×2
GOWN STRL REUS W/ TWL LRG LVL3 (GOWN DISPOSABLE) ×12 IMPLANT
GOWN STRL REUS W/TWL LRG LVL3 (GOWN DISPOSABLE) ×55
GUIDEWIRE STR DUAL SENSOR (WIRE) ×2 IMPLANT
HEMOSTAT POWDER SURGIFOAM 1G (HEMOSTASIS) ×15 IMPLANT
HEMOSTAT SURGICEL 2X14 (HEMOSTASIS) ×5 IMPLANT
INSERT FOGARTY XLG (MISCELLANEOUS) ×2 IMPLANT
KIT BASIN OR (CUSTOM PROCEDURE TRAY) ×5 IMPLANT
KIT ROOM TURNOVER OR (KITS) ×5 IMPLANT
KIT SUCTION CATH 14FR (SUCTIONS) ×5 IMPLANT
KIT VASOVIEW HEMOPRO VH 3000 (KITS) ×5 IMPLANT
LEAD PACING MYOCARDI (MISCELLANEOUS) ×5 IMPLANT
MARKER GRAFT CORONARY BYPASS (MISCELLANEOUS) ×15 IMPLANT
NS IRRIG 1000ML POUR BTL (IV SOLUTION) ×29 IMPLANT
PACK OPEN HEART (CUSTOM PROCEDURE TRAY) ×5 IMPLANT
PAD ARMBOARD 7.5X6 YLW CONV (MISCELLANEOUS) ×10 IMPLANT
PAD ELECT DEFIB RADIOL ZOLL (MISCELLANEOUS) ×5 IMPLANT
PENCIL BUTTON HOLSTER BLD 10FT (ELECTRODE) ×5 IMPLANT
PUNCH AORTIC ROTATE  4.5MM 8IN (MISCELLANEOUS) ×4 IMPLANT
PUNCH AORTIC ROTATE 4.0MM (MISCELLANEOUS) IMPLANT
PUNCH AORTIC ROTATE 4.5MM 8IN (MISCELLANEOUS) IMPLANT
PUNCH AORTIC ROTATE 5MM 8IN (MISCELLANEOUS) IMPLANT
SET CARDIOPLEGIA MPS 5001102 (MISCELLANEOUS) ×2 IMPLANT
SET CYSTO W/LG BORE CLAMP LF (SET/KITS/TRAYS/PACK) ×2 IMPLANT
SOLUTION ANTI FOG 6CC (MISCELLANEOUS) ×2 IMPLANT
SPONGE LAP 18X18 X RAY DECT (DISPOSABLE) ×4 IMPLANT
SPONGE LAP 4X18 X RAY DECT (DISPOSABLE) ×2 IMPLANT
SURGIFLO W/THROMBIN 8M KIT (HEMOSTASIS) ×5 IMPLANT
SURGILUBE 2OZ TUBE FLIPTOP (MISCELLANEOUS) ×6 IMPLANT
SUT BONE WAX W31G (SUTURE) ×5 IMPLANT
SUT MNCRL AB 4-0 PS2 18 (SUTURE) ×2 IMPLANT
SUT PROLENE 3 0 SH DA (SUTURE) IMPLANT
SUT PROLENE 3 0 SH1 36 (SUTURE) IMPLANT
SUT PROLENE 4 0 RB 1 (SUTURE) ×10
SUT PROLENE 4 0 SH DA (SUTURE) ×7 IMPLANT
SUT PROLENE 4-0 RB1 .5 CRCL 36 (SUTURE) ×3 IMPLANT
SUT PROLENE 5 0 C 1 36 (SUTURE) ×4 IMPLANT
SUT PROLENE 6 0 C 1 30 (SUTURE) ×10 IMPLANT
SUT PROLENE 6 0 CC (SUTURE) ×21 IMPLANT
SUT PROLENE 8 0 BV175 6 (SUTURE) ×6 IMPLANT
SUT PROLENE BLUE 7 0 (SUTURE) ×7 IMPLANT
SUT SILK  1 MH (SUTURE) ×6
SUT SILK 1 MH (SUTURE) IMPLANT
SUT SILK 1 TIES 10X30 (SUTURE) ×2 IMPLANT
SUT SILK 2 0 SH CR/8 (SUTURE) ×6 IMPLANT
SUT SILK 2 0 TIES 10X30 (SUTURE) ×2 IMPLANT
SUT SILK 2 0 TIES 17X18 (SUTURE) ×5
SUT SILK 2-0 18XBRD TIE BLK (SUTURE) IMPLANT
SUT SILK 3 0 SH CR/8 (SUTURE) ×4 IMPLANT
SUT SILK 4 0 TIE 10X30 (SUTURE) ×4 IMPLANT
SUT STEEL 6MS V (SUTURE) ×6 IMPLANT
SUT STEEL SZ 6 DBL 3X14 BALL (SUTURE) ×11 IMPLANT
SUT TEM PAC WIRE 2 0 SH (SUTURE) ×8 IMPLANT
SUT VIC AB 1 CTX 36 (SUTURE) ×10
SUT VIC AB 1 CTX36XBRD ANBCTR (SUTURE) ×6 IMPLANT
SUT VIC AB 2-0 CT1 27 (SUTURE) ×5
SUT VIC AB 2-0 CT1 TAPERPNT 27 (SUTURE) IMPLANT
SUT VIC AB 2-0 CTX 27 (SUTURE) ×4 IMPLANT
SUT VIC AB 3-0 X1 27 (SUTURE) ×4 IMPLANT
SUTURE E-PAK OPEN HEART (SUTURE) ×3 IMPLANT
SYRINGE 10CC LL (SYRINGE) ×4 IMPLANT
SYSTEM SAHARA CHEST DRAIN ATS (WOUND CARE) ×5 IMPLANT
TAPE CLOTH SURG 4X10 WHT LF (GAUZE/BANDAGES/DRESSINGS) ×4 IMPLANT
TAPE PAPER 2X10 WHT MICROPORE (GAUZE/BANDAGES/DRESSINGS) ×2 IMPLANT
TOWEL GREEN STERILE (TOWEL DISPOSABLE) ×14 IMPLANT
TOWEL GREEN STERILE FF (TOWEL DISPOSABLE) ×8 IMPLANT
TOWEL OR 17X24 6PK STRL BLUE (TOWEL DISPOSABLE) ×6 IMPLANT
TOWEL OR 17X26 10 PK STRL BLUE (TOWEL DISPOSABLE) ×6 IMPLANT
TRAY FOLEY SILVER 16FR TEMP (SET/KITS/TRAYS/PACK) ×9 IMPLANT
TUBING INSUFFLATION (TUBING) ×5 IMPLANT
UNDERPAD 30X30 (UNDERPADS AND DIAPERS) ×5 IMPLANT
WATER STERILE IRR 1000ML POUR (IV SOLUTION) ×10 IMPLANT
WATER STERILE IRR 1000ML UROMA (IV SOLUTION) ×2 IMPLANT

## 2017-06-27 NOTE — Anesthesia Postprocedure Evaluation (Signed)
Anesthesia Post Note  Patient: Larry [name]  Procedure(s) Performed: CORONARY ARTERY BYPASS GRAFTING (CABG), time four   using the left internal mammary artery, and right saphenous leg vein harvested endoscopically. (N/A Chest) TRANSESOPHAGEAL ECHOCARDIOGRAM (TEE) (N/A ) CYSTOSCOPY FLEXIBLE, Balloon dilatation placement of foley catheter (N/A Penis)     Patient location during evaluation: SICU Anesthesia Type: General Level of consciousness: patient remains intubated per anesthesia plan Pain management: pain level controlled Vital Signs Assessment: post-procedure vital signs reviewed and stable Respiratory status: patient remains intubated per anesthesia plan Cardiovascular status: stable Anesthetic complications: no    Last Vitals:  Vitals:   06/27/17 1600 06/27/17 1604  BP: 113/69   Pulse: 88 88  Resp: 12 12  Temp: 36.6 C 36.5 C  SpO2: 100% 100%    Last Pain:  Vitals:   06/27/17 1537  TempSrc: Core (Comment)  PainSc:                  Wanisha Shiroma

## 2017-06-27 NOTE — Brief Op Note (Signed)
06/24/2017 - 06/27/2017  1:09 PM  PATIENT:  [name]  76 y.o. male  PRE-OPERATIVE DIAGNOSIS:  CAD  POST-OPERATIVE DIAGNOSIS:  CAD  PROCEDURE:  Procedure(s):  CORONARY ARTERY BYPASS GRAFTING x 4 -LIMA to LAD -SVG to RAMUS INTERMEDIATE -SVG to OM -SVG to RCA  ENDOSCOPIC HARVEST GREATER SAPHENOUS VEIN -Right Leg  TRANSESOPHAGEAL ECHOCARDIOGRAM (TEE) (N/A)  CYSTOSCOPY FLEXIBLE, Balloon dilatation placement of foley catheter (N/A)  SURGEON:  Surgeon(s) and Role:    Ivin Poot, MD - Primary    * Kathie Rhodes, MD - Assisting  PHYSICIAN ASSISTANT: Ellwood Handler PA-C  ANESTHESIA:   general  EBL:    BLOOD ADMINISTERED: CELLSAVER  DRAINS: Left Pleural Chest Tube, Mediastinal Chest Drains   LOCAL MEDICATIONS USED:  NONE  SPECIMEN:  No Specimen  DISPOSITION OF SPECIMEN:  N/A  COUNTS:  YES  TOURNIQUET:  * No tourniquets in log *  DICTATION: .Dragon Dictation  PLAN OF CARE: Admit to inpatient   PATIENT DISPOSITION:  ICU - intubated and hemodynamically stable.   Delay start of Pharmacological VTE agent (>24hrs) due to surgical blood loss or risk of bleeding: yes

## 2017-06-27 NOTE — Anesthesia Procedure Notes (Signed)
Central Venous Catheter Insertion Performed by: Albertha Ghee, MD, anesthesiologist Start/End11/03/2017 7:12 AM, 06/27/2017 7:30 AM Patient location: Pre-op. Preanesthetic checklist: patient identified, IV checked, site marked, risks and benefits discussed, surgical consent, monitors and equipment checked, pre-op evaluation, timeout performed and anesthesia consent Position: Trendelenburg Lidocaine 1% used for infiltration and patient sedated Hand hygiene performed , maximum sterile barriers used  and Seldinger technique used Catheter size: 8.5 Fr Central line and PA cath was placed.Sheath introducer Swan type:thermodilation Procedure performed using ultrasound guided technique. Ultrasound Notes:anatomy identified, needle tip was noted to be adjacent to the nerve/plexus identified, no ultrasound evidence of intravascular and/or intraneural injection and image(s) printed for medical record Attempts: 1 Following insertion, line sutured, dressing applied and Biopatch. Post procedure assessment: blood return through all ports, free fluid flow and no air  Patient tolerated the procedure well with no immediate complications.

## 2017-06-27 NOTE — Consult Note (Signed)
            [facility] CM Primary Care Navigator  06/27/2017  KAIROS PANETTA [birth year] [id number]   Attempt to see patient at the bedside to identify possible discharge needs but patient was transferred to [facility] 06 after surgery (Coronary Artery Bypass grafting).  Will attempt to see patient at another time when out of ICU.   For additional questions please contact:  Edwena Felty A. Karysa Heft, BSN, RN-BC [facility] PRIMARY CARE Navigator Cell: [phone]

## 2017-06-27 NOTE — Procedures (Signed)
Extubation Procedure Note  Patient Details:   Name: Larry [name] DOB: 21-Jan-1941 MRN: [id number]   Airway Documentation:     Evaluation  O2 sats: stable throughout Complications: No apparent complications Patient did tolerate procedure well. Bilateral Breath Sounds: Clear, Diminished   Yes     Patient was able to perform a NIF -40 and a VC 1.1L.  Patient had a positive cuff leak.  Patient was extubated to 4 L [place] and performed IS of 788mL.  Carrington Clamp A 06/27/2017, 7:58 PM

## 2017-06-27 NOTE — Progress Notes (Signed)
  Echocardiogram Echocardiogram Transesophageal has been performed.  Larry [name] 06/27/2017, 10:50 AM

## 2017-06-27 NOTE — Consult Note (Signed)
Urology Consult  CC: Referring physician:   Dr. Nils Pyle Reason for referral:  Difficult  Catheter placement    Procedure: Cystoscopy with dilation of bladder neck contracture and difficult Foley catheter placement:  The patient was asleep on the operating room table in the supine position.  His genitalia was sterilely prepped and draped.  The 17 French flexible cystoscope was passed under direct vision down the urethra which was noted to be normal to the level of the bladder neck where the contracture was identified with a pinpoint opening.  I passed a 0.038 in sensor guidewire through the cystoscope and through the opening and into the bladder.  This was left in place and the cystoscope was backed off of the guidewire.    I passed a urethral dilating balloon over the guidewire and across the bladder neck contracture and inflated this to 18 atmospheres for 1 min.  I then deflated the balloon, left the guidewire in place and removed the dilating balloon.  Cystoscopy was then performed next to the guidewire and I noted a bladder neck contracture was widely patent.  I entered the bladder and noted trabeculation but no tumors, stones or inflammatory lesions were identified.  Ureteral orifices were noted to be of normal configuration and position.  The guidewire was left in place and the cystoscope was removed.  I passed a 37 [place] [employer] tip catheter over the guidewire and into the bladder.  I filled the balloon with 10 cc of sterile water and this was connected to closed system drainage.  Impression/Assessment: 1.  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  He had been experiencing some stress urinary incontinence.  This may worsen slightly now that I have dilated the contracture.  He should not have difficulty voiding after surgery and his Foley catheter can be removed at any time when it is felt to be no longer needed.  He will follow up with me as previously  scheduled.  2. History of prostate cancer-  He underwent a radical prostatectomy  By Dr. Alford Highland at [facility] in 2001.  His PSA has remained undetectable since that time.   Plan:     1.  Maintain Foley catheter until no longer needed to monitor urine output.      2. He should not have any further difficulty urinating now that his bladder neck contracture has been dilated and will follow up with me as previously scheduled.    History of Present Illness: Mr. Dentinger is a 76 year old male patient seen in hospital consultation for difficult Foley catheter placement.   He is currently undergoing elective cardiac surgery and is asleep and intubated in the operating room.  In preparation for his surgery a Foley catheter was attempted to be placed but this was unsuccessful.  Several attempts at different size coude tipped catheters were undertaken but were unsuccessful.    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 He has remained free of disease since his surgery.  He has developed some mild stress urinary incontinence.  He has no prior history of urethral stricture or bladder neck contracture.   His current problem would be considered of moderate severity with no modifying factors or associated signs and symptoms.  Past Medical History:  Diagnosis Date  . Cataracts, bilateral    removed  . Hyperlipemia   . Prostate CA ([place])   . Sleep apnea  Dr Ledora Bottcher ; uses CPAP  . Type II or unspecified type diabetes mellitus without mention of complication, not stated as uncontrolled   . Unspecified essential hypertension    Past Surgical History:  Procedure Laterality Date  . APPENDECTOMY    . CATARACT EXTRACTION, BILATERAL    . PROSTATECTOMY      Medications:  Prior to Admission:  Facility-Administered Medications Prior to Admission  Medication Dose Route Frequency Provider Last Rate Last Dose  . pneumococcal 23 valent vaccine  (PNU-IMMUNE) injection 0.5 mL  0.5 mL Intramuscular Once Plotnikov, [name], MD       Medications Prior to Admission  Medication Sig Dispense Refill Last Dose  . Ascorbic Acid (VITAMIN C) 1000 MG tablet Take 1,000 mg by mouth daily.   06/23/2017 at 0900  . aspirin EC 81 MG tablet Take 1 tablet (81 mg total) by mouth daily. 90 tablet 3 06/24/2017 at 0900  . Cholecalciferol (VITAMIN D3) 1000 UNITS CAPS Take 1 capsule by mouth daily.    06/23/2017 at 0900  . furosemide (LASIX) 40 MG tablet Take 1 tablet (40 mg total) by mouth daily as needed. 30 tablet 1 06/23/2017 at 0900  . glimepiride (AMARYL) 2 MG tablet Take 1 tablet (2 mg total) by mouth 2 (two) times daily. (Patient taking differently: Take 2 mg by mouth daily with breakfast. ) 180 tablet 3 06/23/2017 at 0900  . glucose blood (ACCU-CHEK SMARTVIEW) test strip 1 each by Other route 2 (two) times daily. Use to check blood sugars twice a day Dx E11.9 100 each 3 06/23/2017 at Unknown time  . glucose blood test strip 1 each by Other route 2 (two) times daily. Please dispense brand of choice per pt and insurance preference. Dx:250.00 100 each 5 06/23/2017 at Unknown time  . glucose monitoring kit (FREESTYLE) monitoring kit 1 each by Does not apply route as needed for other. Please dispense brand of choice per ins/patient. Dx: 250.00. 1 each 0 06/23/2017 at Unknown time  . Krill Oil 300 MG CAPS Take 300 mg by mouth daily.   06/23/2017 at 0900  . metFORMIN (GLUCOPHAGE) 1000 MG tablet Take 1 tablet (1,000 mg total) by mouth 2 (two) times daily with a meal. 180 tablet 3 06/23/2017 at 0900  . Multiple Vitamins-Minerals (MH MACULAR HEALTH PO) Take 1 tablet by mouth 2 (two) times daily.   06/23/2017 at 0900  . nitroGLYCERIN (NITROSTAT) 0.4 MG SL tablet Place 1 tablet (0.4 mg total) under the tongue every 5 (five) minutes as needed for chest pain. 20 tablet 3 Taking  . valsartan (DIOVAN) 320 MG tablet Take 320 mg by mouth at bedtime.    06/23/2017 at 0900  . ACCU-CHEK  FASTCLIX LANCETS MISC 1 each by Does not apply route 2 (two) times daily. Dx:E11.9 100 each 5 Taking  . simvastatin (ZOCOR) 40 MG tablet Take 1 tablet (40 mg total) by mouth at bedtime. 90 tablet 3 06/22/2017    Allergies:  Allergies  Allergen Reactions  . Amlodipine     Leg swelling  . Clindamycin Hcl   . Verapamil     Edema w/high dose    Family History  Problem Relation Age of Onset  . Heart disease Mother   . Diabetes Unknown   . Hypertension Unknown   . Colon cancer Neg Hx     Social History:  reports that  has never smoked. he has never used smokeless tobacco. He reports that he does not drink alcohol or use drugs.  Review of Systems (10 point):   Unobtainable as the patient was under general anesthesia and intubated.  Physical Exam:  Vital signs in last 24 hours: Temp:  [97.8 F (36.6 C)-98.7 F (37.1 C)] 98.7 F (37.1 C) (11/08 0425) Pulse Rate:  [64-71] 70 (11/08 0425) Resp:  [10-13] 13 (11/07 2313) BP: (123-163)/(53-76) 163/76 (11/08 0425) SpO2:  [95 %-100 %] 97 % (11/08 0425) Weight:  [109.4 kg (241 lb 2.9 oz)] 109.4 kg (241 lb 2.9 oz) (11/08 0425) General appearance: alert and appears stated age Head: Normocephalic, without obvious abnormality, atraumatic Eyes:   Not performed Oropharynx: moist mucous membranes Neck: trachea midline Resp: respiratory effort  Control by ventilator Cardio: regular rate and rhythm GI: soft, no masses,  no organomegaly Male genitalia: penis: normal male phallus with no lesions or discharge.Testes: bilaterally descended with no masses or tenderness. no hernias Extremities: extremities normal, atraumatic, no cyanosis or edema Skin: Skin color normal. No visible rashes or lesions  Laboratory Data:  Recent Labs    06/24/17 1724 06/26/17 0207 06/27/17 0559  WBC 11.5* 10.2 10.0  HGB 14.1 12.7* 13.2  HCT 42.9 39.7 40.4   BMET Recent Labs    06/26/17 0207 06/27/17 0559  NA 138 139  K 3.4* 3.6  CL 102 102  CO2 27 30    GLUCOSE 173* 186*  BUN 19 16  CREATININE 1.42* 1.22  CALCIUM 8.4* 8.5*   Recent Labs    06/24/17 1210 06/25/17 0318  INR 0.97 1.05   No results for input(s): LABURIN in the last 72 hours. Results for orders placed or performed during the hospital encounter of 06/24/17  Surgical pcr screen     Status: None   Collection Time: 06/27/17  5:31 AM  Result Value Ref Range Status   MRSA, PCR NEGATIVE NEGATIVE Final   Staphylococcus aureus NEGATIVE NEGATIVE Final    Comment: (NOTE) The Xpert SA Assay (FDA approved for NASAL specimens in patients 60 years of age and older), is one component of a comprehensive surveillance program. It is not intended to diagnose infection nor to guide or monitor treatment.    Creatinine: Recent Labs    06/24/17 1724 06/26/17 0207 06/27/17 0559  CREATININE 1.21 1.42* 1.22    Imaging: No results found.     Rankin Coolman C 06/27/2017, 9:47 AM

## 2017-06-27 NOTE — Progress Notes (Signed)
Pre Procedure note for inpatients:   Larry [name] has been scheduled for Procedure(s): CORONARY ARTERY BYPASS GRAFTING (CABG) (N/A) TRANSESOPHAGEAL ECHOCARDIOGRAM (TEE) (N/A) today. The various methods of treatment have been discussed with the patient. After consideration of the risks, benefits and treatment options the patient has consented to the planned procedure.   The patient has been seen and labs reviewed. There are no changes in the patient's condition to prevent proceeding with the planned procedure today.  Recent labs:  Lab Results  Component Value Date   WBC 10.0 06/27/2017   HGB 13.2 06/27/2017   HCT 40.4 06/27/2017   PLT 252 06/27/2017   GLUCOSE 173 (H) 06/26/2017   CHOL 128 06/18/2017   TRIG 105.0 06/18/2017   HDL 40.40 06/18/2017   LDLCALC 66 06/18/2017   ALT 17 06/18/2017   AST 15 06/18/2017   NA 138 06/26/2017   K 3.4 (L) 06/26/2017   CL 102 06/26/2017   CREATININE 1.42 (H) 06/26/2017   BUN 19 06/26/2017   CO2 27 06/26/2017   TSH 0.879 06/25/2017   PSA 0.00 Repeated and verified X2. (L) 12/26/2012   INR 1.05 06/25/2017   HGBA1C 7.1 (H) 06/25/2017    Len Childs, MD 06/27/2017 7:38 AM

## 2017-06-27 NOTE — Anesthesia Procedure Notes (Signed)
Arterial Line Insertion Start/End11/03/2017 7:11 AM, 06/27/2017 7:18 AM Performed by: Imagene Riches, CRNA, CRNA  Patient location: Pre-op. Preanesthetic checklist: patient identified, IV checked, site marked, risks and benefits discussed, surgical consent, monitors and equipment checked, pre-op evaluation and anesthesia consent Patient sedated radial was placed Catheter size: 20 G Hand hygiene performed  and maximum sterile barriers used  Allen's test indicative of satisfactory collateral circulation Attempts: 1 Procedure performed without using ultrasound guided technique. Following insertion, Biopatch. Post procedure assessment: normal  Patient tolerated the procedure well with no immediate complications.

## 2017-06-27 NOTE — Transfer of Care (Signed)
Immediate Anesthesia Transfer of Care Note  Patient: Larry [name]  Procedure(s) Performed: CORONARY ARTERY BYPASS GRAFTING (CABG), time four   using the left internal mammary artery, and right saphenous leg vein harvested endoscopically. (N/A Chest) TRANSESOPHAGEAL ECHOCARDIOGRAM (TEE) (N/A ) CYSTOSCOPY FLEXIBLE, Balloon dilatation placement of foley catheter (N/A Penis)  Patient Location: SICU  Anesthesia Type:General  Level of Consciousness: sedated and Patient remains intubated per anesthesia plan  Airway & Oxygen Therapy: Patient remains intubated per anesthesia plan  Post-op Assessment: Report given to RN and Post -op Vital signs reviewed and stable  Post vital signs: Reviewed and stable  Last Vitals:  Vitals:   06/26/17 2313 06/27/17 0425  BP: 138/68 (!) 163/76  Pulse: 65 70  Resp: 13   Temp: 37 C 37.1 C  SpO2: 98% 97%    Last Pain:  Vitals:   06/27/17 0425  TempSrc: Oral  PainSc: 0-No pain      Patients Stated Pain Goal: 5 ([id number])  Complications: No apparent anesthesia complications

## 2017-06-27 NOTE — Progress Notes (Signed)
Patient placed on CPAP of 9cmH2O via FFM with a 4L O2 bled in through the tubing.  Patient stated that the pressure was fine and is resting comfortably. RN was present for application.    06/27/17 2153  BiPAP/CPAP/SIPAP  BiPAP/CPAP/SIPAP Pt Type Adult  Mask Type Full face mask  Mask Size Large  Respiratory Rate 13 breaths/min  EPAP 9 cmH2O  Flow Rate 4 lpm  BiPAP/CPAP/SIPAP CPAP  Patient Home Equipment No  Auto Titrate No  BiPAP/CPAP /SiPAP Vitals  Bilateral Breath Sounds Clear;Diminished

## 2017-06-27 NOTE — Anesthesia Procedure Notes (Signed)
Procedure Name: Intubation Date/Time: 06/27/2017 7:58 AM Performed by: Imagene Riches, CRNA Pre-anesthesia Checklist: Patient identified, Emergency Drugs available, Suction available and Patient being monitored Patient Re-evaluated:Patient Re-evaluated prior to induction Oxygen Delivery Method: Circle System Utilized Preoxygenation: Pre-oxygenation with 100% oxygen Induction Type: IV induction Ventilation: Mask ventilation without difficulty and Oral airway inserted - appropriate to patient size Laryngoscope Size: Glidescope and 4 Tube type: Oral Tube size: 8.0 mm Number of attempts: 2 Airway Equipment and Method: Stylet and Oral airway Placement Confirmation: ETT inserted through vocal cords under direct vision,  positive ETCO2 and breath sounds checked- equal and bilateral Secured at: 23 cm Tube secured with: Tape Dental Injury: Teeth and Oropharynx as per pre-operative assessment  Difficulty Due To: Difficulty was anticipated and Difficult Airway- due to anterior larynx Comments: First DL with miller 2. Grade 3 view, attempt to pass ETT resulted in esophageal placement. Anterior larynx. ETT removed. Patient mask ventilated. VSS. Second attempt with glide scope, ETT passed easily through cords. Positive ETCO2 and BBS.

## 2017-06-27 NOTE — Anesthesia Preprocedure Evaluation (Addendum)
Anesthesia Evaluation  Patient identified by MRN, date of birth, ID band Patient awake    Reviewed: Allergy & Precautions, NPO status , Patient's Chart, lab work & pertinent test results  Airway Mallampati: II  TM Distance: >3 FB     Dental   Pulmonary sleep apnea , pneumonia,    breath sounds clear to auscultation       Cardiovascular hypertension, + CAD and + DOE   Rhythm:Regular Rate:Normal     Neuro/Psych    GI/Hepatic negative GI ROS, Neg liver ROS,   Endo/Other  diabetes  Renal/GU negative Renal ROS     Musculoskeletal   Abdominal   Peds  Hematology   Anesthesia Other Findings   Reproductive/Obstetrics                             Anesthesia Physical Anesthesia Plan  ASA: III  Anesthesia Plan: General   Post-op Pain Management:    Induction: Intravenous  PONV Risk Score and Plan: 2 and Treatment may vary due to age or medical condition  Airway Management Planned: Oral ETT  Additional Equipment: TEE, Arterial line and PA Cath  Intra-op Plan:   Post-operative Plan: Post-operative intubation/ventilation  Informed Consent: I have reviewed the patients History and Physical, chart, labs and discussed the procedure including the risks, benefits and alternatives for the proposed anesthesia with the patient or authorized representative who has indicated his/her understanding and acceptance.   Dental advisory given  Plan Discussed with: CRNA and Anesthesiologist  Anesthesia Plan Comments:         Anesthesia Quick Evaluation

## 2017-06-27 NOTE — OR Nursing (Signed)
Attempted to insert 16 fr foley cath, with difficulty  Then attempted to insert a 14 fr foley cath with difficulty  Dr.Vantright notified and he attempted a 14, 16, and 18 fr coude with difficulty.  Urologist on call was notified. Dr. Karsten Ro came and did a cystoscopy, balloon dilatation and foley placement.

## 2017-06-27 NOTE — Progress Notes (Signed)
Patient ID: Larry [name], male   DOB: Jan 22, 1941, 76 y.o.   MRN: [id number] EVENING ROUNDS NOTE :     [address]       [place], [address]             [phone]                 Day of Surgery Procedure(s) (LRB): CORONARY ARTERY BYPASS GRAFTING (CABG), time four   using the left internal mammary artery, and right saphenous leg vein harvested endoscopically. (N/A) TRANSESOPHAGEAL ECHOCARDIOGRAM (TEE) (N/A) CYSTOSCOPY FLEXIBLE, Balloon dilatation placement of foley catheter (N/A)  Total Length of Stay:  LOS: 2 days  BP (!) 123/57 (BP Location: Left Arm)   Pulse 88   Temp 97.7 F (36.5 C)   Resp 12   Ht 6' (1.829 m)   Wt 241 lb 2.9 oz (109.4 kg)   SpO2 100%   BMI 32.71 kg/m   .Intake/Output      11/07 0701 - 11/08 0700 11/08 0701 - 11/09 0700   P.O. 360    I.V. (mL/kg)  3680.8 (33.6)   Blood  600   IV Piggyback  1300   Total Intake(mL/kg) 360 (3.3) 5580.8 (51)   Urine (mL/kg/hr) 800 (0.3) 1700 (1.4)   Emesis/NG output  30   Blood  1000   Chest Tube  110   Total Output 800 2840   Net -440 +2740.8          . sodium chloride    . [START ON 06/28/2017] sodium chloride    . sodium chloride 20 mL/hr at 06/27/17 1540  . albumin human    . cefUROXime (ZINACEF)  IV 1.5 g (06/27/17 1758)  . dexmedetomidine (PRECEDEX) IV infusion 0.4 mcg/kg/hr (06/27/17 1801)  . DOPamine    . famotidine (PEPCID) IV Stopped (06/27/17 1619)  . insulin (NOVOLIN-R) infusion 3.1 Units/hr (06/27/17 1801)  . lactated ringers 20 mL/hr at 06/27/17 1534  . lactated ringers 20 mL/hr at 06/27/17 1536  . magnesium sulfate 4 g (06/27/17 1545)  . milrinone 0.25 mcg/kg/min (06/27/17 1537)  . nitroGLYCERIN    . phenylephrine (NEO-SYNEPHRINE) Adult infusion 30 mcg/min (06/27/17 1746)  . potassium chloride Stopped (06/27/17 1738)  . vancomycin       Lab Results  Component Value Date   WBC 18.1 (H) 06/27/2017   HGB 11.2 (L) 06/27/2017   HCT 34.0 (L) 06/27/2017   PLT 203 06/27/2017    GLUCOSE 183 (H) 06/27/2017   CHOL 128 06/18/2017   TRIG 105.0 06/18/2017   HDL 40.40 06/18/2017   LDLCALC 66 06/18/2017   ALT 17 06/18/2017   AST 15 06/18/2017   NA 139 06/27/2017   K 3.6 06/27/2017   CL 100 (L) 06/27/2017   CREATININE 0.90 06/27/2017   BUN 15 06/27/2017   CO2 30 06/27/2017   TSH 0.879 06/25/2017   PSA 0.00 Repeated and verified X2. (L) 12/26/2012   INR 1.32 06/27/2017   HGBA1C 7.1 (H) 06/25/2017   Early postop, on vent but awake and starting to wean vent   Grace Isaac MD  Beeper [phone] Office [phone] 06/27/2017 6:11 PM

## 2017-06-28 ENCOUNTER — Inpatient Hospital Stay (HOSPITAL_COMMUNITY): Payer: Medicare Other

## 2017-06-28 ENCOUNTER — Encounter (HOSPITAL_COMMUNITY): Payer: Self-pay | Admitting: Cardiothoracic Surgery

## 2017-06-28 LAB — CBC
HCT: 32.8 % — ABNORMAL LOW (ref 39.0–52.0)
HCT: 32.9 % — ABNORMAL LOW (ref 39.0–52.0)
HEMOGLOBIN: 10.6 g/dL — AB (ref 13.0–17.0)
Hemoglobin: 10.8 g/dL — ABNORMAL LOW (ref 13.0–17.0)
MCH: 29.8 pg (ref 26.0–34.0)
MCH: 29.7 pg (ref 26.0–34.0)
MCHC: 32.9 g/dL (ref 30.0–36.0)
MCHC: 32.2 g/dL (ref 30.0–36.0)
MCV: 90.4 fL (ref 78.0–100.0)
MCV: 92.2 fL (ref 78.0–100.0)
PLATELETS: 207 10*3/uL (ref 150–400)
Platelets: 205 10*3/uL (ref 150–400)
RBC: 3.63 MIL/uL — ABNORMAL LOW (ref 4.22–5.81)
RBC: 3.57 MIL/uL — ABNORMAL LOW (ref 4.22–5.81)
RDW: 13.1 % (ref 11.5–15.5)
RDW: 13.7 % (ref 11.5–15.5)
WBC: 18.5 10*3/uL — ABNORMAL HIGH (ref 4.0–10.5)
WBC: 18.9 10*3/uL — ABNORMAL HIGH (ref 4.0–10.5)

## 2017-06-28 LAB — GLUCOSE, CAPILLARY
GLUCOSE-CAPILLARY: 146 mg/dL — AB (ref 65–99)
GLUCOSE-CAPILLARY: 115 mg/dL — AB (ref 65–99)
GLUCOSE-CAPILLARY: 127 mg/dL — AB (ref 65–99)
GLUCOSE-CAPILLARY: 125 mg/dL — AB (ref 65–99)
GLUCOSE-CAPILLARY: 111 mg/dL — AB (ref 65–99)
GLUCOSE-CAPILLARY: 184 mg/dL — AB (ref 65–99)
GLUCOSE-CAPILLARY: 118 mg/dL — AB (ref 65–99)
GLUCOSE-CAPILLARY: 124 mg/dL — AB (ref 65–99)
GLUCOSE-CAPILLARY: 120 mg/dL — AB (ref 65–99)
GLUCOSE-CAPILLARY: 152 mg/dL — AB (ref 65–99)
Glucose-Capillary: 139 mg/dL — ABNORMAL HIGH (ref 65–99)
Glucose-Capillary: 130 mg/dL — ABNORMAL HIGH (ref 65–99)
Glucose-Capillary: 116 mg/dL — ABNORMAL HIGH (ref 65–99)
Glucose-Capillary: 119 mg/dL — ABNORMAL HIGH (ref 65–99)
Glucose-Capillary: 122 mg/dL — ABNORMAL HIGH (ref 65–99)
Glucose-Capillary: 114 mg/dL — ABNORMAL HIGH (ref 65–99)
Glucose-Capillary: 193 mg/dL — ABNORMAL HIGH (ref 65–99)
Glucose-Capillary: 168 mg/dL — ABNORMAL HIGH (ref 65–99)

## 2017-06-28 LAB — BASIC METABOLIC PANEL
Anion gap: 6 (ref 5–15)
BUN: 14 mg/dL (ref 6–20)
CALCIUM: 7.8 mg/dL — AB (ref 8.9–10.3)
CO2: 23 mmol/L (ref 22–32)
CREATININE: 1.13 mg/dL (ref 0.61–1.24)
Chloride: 107 mmol/L (ref 101–111)
GFR calc Af Amer: >60 mL/min (ref >60)
GLUCOSE: 120 mg/dL — AB (ref 65–99)
POTASSIUM: 3.6 mmol/L (ref 3.5–5.1)
SODIUM: 136 mmol/L (ref 135–145)

## 2017-06-28 LAB — POCT I-STAT 3, ART BLOOD GAS (G3+)
Bicarbonate: 24.3 mmol/L (ref 20.0–28.0)
O2 Saturation: 94 %
PCO2 ART: 39.9 mmHg (ref 32.0–48.0)
PH ART: 7.396 (ref 7.350–7.450)
PO2 ART: 72 mmHg — AB (ref 83.0–108.0)
Patient temperature: 37.5
TCO2: 26 mmol/L (ref 22–32)

## 2017-06-28 LAB — CREATININE, SERUM
Creatinine, Ser: 1.36 mg/dL — ABNORMAL HIGH (ref 0.61–1.24)
GFR, EST AFRICAN AMERICAN: 57 mL/min — AB (ref >60)
GFR, EST NON AFRICAN AMERICAN: 49 mL/min — AB (ref >60)

## 2017-06-28 LAB — POCT I-STAT 4, (NA,K, GLUC, HGB,HCT)
GLUCOSE: 152 mg/dL — AB (ref 65–99)
HCT: 31 % — ABNORMAL LOW (ref 39.0–52.0)
Hemoglobin: 10.5 g/dL — ABNORMAL LOW (ref 13.0–17.0)
POTASSIUM: 3.5 mmol/L (ref 3.5–5.1)
Sodium: 142 mmol/L (ref 135–145)

## 2017-06-28 LAB — MAGNESIUM
MAGNESIUM: 2.4 mg/dL (ref 1.7–2.4)
MAGNESIUM: 2.1 mg/dL (ref 1.7–2.4)

## 2017-06-28 LAB — POCT I-STAT, CHEM 8
BUN: 16 mg/dL (ref 6–20)
CREATININE: 1.3 mg/dL — AB (ref 0.61–1.24)
Calcium, Ion: 1.11 mmol/L — ABNORMAL LOW (ref 1.15–1.40)
Chloride: 100 mmol/L — ABNORMAL LOW (ref 101–111)
GLUCOSE: 140 mg/dL — AB (ref 65–99)
HEMATOCRIT: 32 % — AB (ref 39.0–52.0)
Hemoglobin: 10.9 g/dL — ABNORMAL LOW (ref 13.0–17.0)
Potassium: 4.3 mmol/L (ref 3.5–5.1)
Sodium: 138 mmol/L (ref 135–145)
TCO2: 24 mmol/L (ref 22–32)

## 2017-06-28 MED ORDER — METOPROLOL TARTRATE 25 MG/10 ML ORAL SUSPENSION: 25.0000 mg | Freq: Two times a day (BID) | ORAL | Status: DC

## 2017-06-28 MED ORDER — METOPROLOL TARTRATE 25 MG PO TABS
25.0000 mg | ORAL_TABLET | Freq: Two times a day (BID) | ORAL | Status: DC
  Administered 2017-06-28 – 2017-06-30 (×5): 25 mg via ORAL
  Filled 2017-06-28 (×5): qty 1

## 2017-06-28 MED ORDER — POTASSIUM CHLORIDE 10 MEQ/50ML IV SOLN
10.0000 meq | INTRAVENOUS | Status: AC
  Administered 2017-06-28 (×3): 10 meq via INTRAVENOUS
  Filled 2017-06-28: qty 50

## 2017-06-28 MED ORDER — NITROPRUSSIDE SODIUM 25 MG/ML IV SOLN
0.0000 ug/kg/min | INTRAVENOUS | Status: DC
  Administered 2017-06-28: 0.1 ug/kg/min via INTRAVENOUS
  Filled 2017-06-28: qty 2

## 2017-06-28 MED ORDER — INSULIN ASPART 100 UNIT/ML ~~LOC~~ SOLN
6.0000 [IU] | Freq: Three times a day (TID) | SUBCUTANEOUS | Status: DC
  Administered 2017-06-28 – 2017-06-30 (×5): 6 [IU] via SUBCUTANEOUS

## 2017-06-28 MED ORDER — INSULIN ASPART 100 UNIT/ML ~~LOC~~ SOLN
0.0000 [IU] | SUBCUTANEOUS | Status: DC
  Administered 2017-06-28 (×2): 2 [IU] via SUBCUTANEOUS
  Administered 2017-06-28 – 2017-06-29 (×2): 4 [IU] via SUBCUTANEOUS
  Administered 2017-06-29: 8 [IU] via SUBCUTANEOUS
  Administered 2017-06-29: 2 [IU] via SUBCUTANEOUS

## 2017-06-28 MED ORDER — INSULIN DETEMIR 100 UNIT/ML ~~LOC~~ SOLN
15.0000 [IU] | Freq: Two times a day (BID) | SUBCUTANEOUS | Status: DC
  Administered 2017-06-28 – 2017-07-03 (×11): 15 [IU] via SUBCUTANEOUS
  Filled 2017-06-28 (×12): qty 0.15

## 2017-06-28 MED ORDER — FUROSEMIDE 10 MG/ML IJ SOLN
40.0000 mg | Freq: Two times a day (BID) | INTRAMUSCULAR | Status: DC
  Administered 2017-06-28 – 2017-06-30 (×6): 40 mg via INTRAVENOUS
  Filled 2017-06-28 (×6): qty 4

## 2017-06-28 MED FILL — Lidocaine HCl IV Inj 20 MG/ML: INTRAVENOUS | Qty: 5 | Status: AC

## 2017-06-28 MED FILL — Heparin Sodium (Porcine) Inj 1000 Unit/ML: INTRAMUSCULAR | Qty: 20 | Status: AC

## 2017-06-28 MED FILL — Electrolyte-R (PH 7.4) Solution: INTRAVENOUS | Qty: 4000 | Status: AC

## 2017-06-28 MED FILL — Sodium Bicarbonate IV Soln 8.4%: INTRAVENOUS | Qty: 50 | Status: AC

## 2017-06-28 MED FILL — Sodium Chloride IV Soln 0.9%: INTRAVENOUS | Qty: 3000 | Status: AC

## 2017-06-28 MED FILL — Mannitol IV Soln 20%: INTRAVENOUS | Qty: 500 | Status: AC

## 2017-06-28 NOTE — Op Note (Signed)
NAME:  SAYED, APOSTOL NO.:  [id number]  MEDICAL RECORD NO.:  [id number]  LOCATION:  2H06C                        FACILITY:  [facility]  PHYSICIAN:  Ivin Poot, M.D.  DATE OF BIRTH:  1941/04/09  DATE OF PROCEDURE:  06/27/2017 DATE OF DISCHARGE:                              OPERATIVE REPORT   OPERATION: 1. Coronary artery bypass grafting x4 (left internal mammary artery to     LAD, saphenous vein graft to OM, saphenous vein graft to ramus     intermediate, saphenous vein graft to RCA). 2. Endoscopic harvest of right leg greater saphenous vein.  PREOPERATIVE DIAGNOSES:  Severe three-vessel coronary artery disease, unstable angina, non-ST elevation myocardial infarction.  POSTOPERATIVE DIAGNOSES:  Severe three-vessel coronary artery disease, unstable angina, non-ST elevation myocardial infarction.  SURGEON:  Ivin Poot, MD.  ASSISTANT:  Ellwood Handler, PA-C.  CLINICAL NOTE:  The patient is a 76 year old obese male, presented to the hospital with unstable angina and positive cardiac enzymes.  Cardiac catheterization demonstrated severe 3-vessel coronary artery disease. He had a left main stenosis as well.  LV function was fairly well preserved.  His cardiologist recommended surgical coronary revascularization.  I saw the patient in consultation after reviewing his coronary arteriograms and echocardiogram.  I agreed with the recommendation for CABG.  I discussed the procedure of CABG with the patient and family including the use of general anesthesia and cardiopulmonary bypass, the location of the surgical incisions, and the expected postoperative hospital recovery.  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  Since the patient was asymptomatic and the ultrasound indicated it was a high lesion.  The  recommendation from Vascular Surgery was to leave the carotid alone and fix his severe coronary and left main disease with a CABG and a followup CTA of the left carotid prior to discharge.  I also discussed with the patient and family the risks of MI, bleeding, blood transfusion requirement, postoperative pulmonary problems including pleural effusion, postoperative arrhythmias, postoperative infection, and death.  After reviewing these issues, he demonstrated his understanding and agreed to proceed with surgery under what I felt was an informed consent.  OPERATIVE FINDINGS: 1. Small but adequate targets for grafting. 2. Well-preserved LV systolic function after separation from     cardiopulmonary bypass. 3. Well-maintained cerebral oximeter flow monitoring during the     operation.  OPERATIVE PROCEDURE:  The patient was brought to the operating room and placed supine on the operating table, where general anesthesia was induced under invasive hemodynamic monitoring.  A transesophageal echo probe was placed by the anesthesia team.  The patient was prepped and draped as a sterile field.  A proper time-out was performed.  A sternal incision was made as the saphenous vein was harvested endoscopically from the right leg.  The left internal mammary artery was harvested as a pedicle graft from its origin at the subclavian vessels.  It was a 1.5- mm vessel with good flow.  The sternal retractor was placed using the deep blades because of the patient's obese  body habitus.  The pericardium was opened and suspended.  Pursestrings were placed in the ascending aorta and right atrium.  After heparin was administered and the ACT was documented as being therapeutic, the patient was cannulated and placed on cardiopulmonary bypass.  The coronaries were identified for grafting and the mammary artery and vein grafts were prepared for the distal anastomoses.  Cardioplegia cannulas were placed for  both antegrade aortic and retrograde coronary sinus cardioplegia.  The patient was cooled to 32 degrees and the aortic crossclamp was carefully applied.  One liter of cold blood cardioplegia was delivered in split doses between the antegrade aortic and retrograde coronary sinus catheters.  There was good cardioplegic arrest and septal temperature dropped less than 12 degrees.  Cardioplegia was delivered every 20 minutes.  The distal coronary anastomoses were performed.  The first distal anastomosis was to the right coronary artery.  It had a proximal 60% to 70% stenosis.  A reverse saphenous vein was sewn end-to-side with running 7-0 Prolene with good flow through the graft.  Cardioplegia was redosed.  The second distal anastomosis was to the large OM branch of the left circumflex.  This was heavily diseased.  A reverse saphenous vein was sewn end-to-side with running 7-0 Prolene with good flow through the graft.  Cardioplegia was redosed.  The third distal anastomosis was the ramus intermediate branch of left coronary.  This had an 80 to 90% stenosis.  A reverse saphenous vein was sewn end-to-side with running 7-0 Prolene with good flow through the graft.  Cardioplegia was redosed.  The fourth and final distal anastomosis was placed to the LAD.  The left IMA was brought through an opening in the left lateral pericardium and was brought down onto the LAD.  The LAD was 1.5-1.7 mm vessel.  The mammary was sewn end-to-side with running 8-0 Prolene.  The bulldog vascular clamp on the mammary pedicle was briefly released and there was good flow through the anastomosis.  The bulldog was reapplied and the pedicle was secured to the epicardium with 6-0 Prolene.  Cardioplegia was redosed.  With the crossclamp still in place, 3 proximal vein anastomoses were performed on the ascending aorta using a 4.5-mm punch and running 6-0 Prolene.  Prior to removing the crossclamp, air was vented from  the coronaries with a dose of retrograde warm blood cardioplegia.  The crossclamp was removed.  The vein grafts were de-aired and opened and each had good flow and hemostasis was documented at the proximal and distal sites.  The cardioplegia cannulas were removed.  The patient was rewarmed and reperfused.  Temporary pacing wires were applied.  The lungs were expanded, and the ventilator was resumed.  The patient was then weaned off cardiopulmonary bypass without difficulty.  Echo showed good LV global function.  Protamine was administered without adverse reaction. The cannulas were removed.  The mediastinum was irrigated.  The superior pericardial fat was closed.  When hemostasis was adequate, chest tubes were placed in the anterior mediastinum and left pleural space and brought out through separate incisions.  The sternum was closed with wire.  The patient remained stable.  The pectoralis fascia was closed with a running #1 Vicryl.  The subcutaneous and skin layers were closed in running Vicryl.  Total cardiopulmonary bypass time was 130 minutes.     Ivin Poot, M.D.     PV/MEDQ  D:  06/28/2017  T:  06/28/2017  Job:  [id number]

## 2017-06-28 NOTE — Progress Notes (Signed)
      [address]       [place] [address]             [phone]       Resting  BP 121/69 (BP Location: Left Arm)   Pulse 65   Temp 97.6 F (36.4 C) (Oral)   Resp 11   Ht 6' (1.829 m)   Wt 255 lb 9.6 oz (115.9 kg)   SpO2 96%   BMI 34.67 kg/m    Intake/Output Summary (Last 24 hours) at 06/28/2017 1757 Last data filed at 06/28/2017 1600 Gross per 24 hour  Intake 4243.61 ml  Output 2005 ml  Net 2238.61 ml   Creatinine 1.3, K= 4.3  CBG well controlled  I/O +, just received another dose of lasix  Remo Lipps C. Roxan Hockey, MD [facility] [phone]

## 2017-06-28 NOTE — Care Management Note (Signed)
Case Management Note  Patient Details  Name: Larry [name] MRN: [id number] Date of Birth: May 13, 1941  Subjective/Objective:   From home with spouse, POD 1 CABG, conts with chest tubes, diuresing.                    Action/Plan: NCM will follow for dc needs.   Expected Discharge Date:                  Expected Discharge Plan:  [place]  In-House Referral:     Discharge planning Services  CM Consult  Post Acute Care Choice:    Choice offered to:     DME Arranged:    DME Agency:     HH Arranged:    [place] Agency:     Status of Service:  In process, will continue to follow  If discussed at Long Length of Stay Meetings, dates discussed:    Additional Comments:  Zenon Mayo, RN 06/28/2017, 4:32 PM

## 2017-06-28 NOTE — Progress Notes (Signed)
Patient was placed on CPAP of 9 cmH2O via FFM with 4 L O2 bled in.  Patient is tolerating at this time.      06/28/17 2205  BiPAP/CPAP/SIPAP  BiPAP/CPAP/SIPAP Pt Type Adult  Mask Type Full face mask  Mask Size Large  Respiratory Rate 18 breaths/min  EPAP 9 cmH2O  Flow Rate 4 lpm  BiPAP/CPAP/SIPAP CPAP  Patient Home Equipment No  Auto Titrate No  BiPAP/CPAP /SiPAP Vitals  Bilateral Breath Sounds Clear;Diminished

## 2017-06-28 NOTE — Progress Notes (Signed)
1 Day Post-Op Procedure(s) (LRB): CORONARY ARTERY BYPASS GRAFTING (CABG), time four   using the left internal mammary artery, and right saphenous leg vein harvested endoscopically. (N/A) TRANSESOPHAGEAL ECHOCARDIOGRAM (TEE) (N/A) CYSTOSCOPY FLEXIBLE, Balloon dilatation placement of foley catheter (N/A) Subjective: Stable after CABG for L main hypertensive  Objective: Vital signs in last 24 hours: Temp:  [97.7 F (36.5 C)-100 F (37.8 C)] 99.1 F (37.3 C) (11/09 0800) Pulse Rate:  [39-107] 39 (11/09 0800) Cardiac Rhythm: Normal sinus rhythm;Heart block (11/09 0800) Resp:  [11-24] 18 (11/09 0800) BP: (113-141)/(51-76) 116/66 (11/09 0800) SpO2:  [91 %-100 %] 98 % (11/09 0800) Arterial Line BP: (100-183)/(44-71) 127/46 (11/09 0800) FiO2 (%):  [40 %-50 %] 40 % (11/08 1915) Weight:  [255 lb 9.6 oz (115.9 kg)] 255 lb 9.6 oz (115.9 kg) (11/09 0130)  Hemodynamic parameters for last 24 hours: PAP: (17-33)/(5-16) 31/11 CO:  [3.2 L/min-7.7 L/min] 6.4 L/min CI:  [1.4 L/min/m2-3.3 L/min/m2] 2.8 L/min/m2  Intake/Output from previous day: 11/08 0701 - 11/09 0700 In: 7290.9 [I.V.:5090.9; Blood:600; IV IDPOEUMPN:3614] Out: 4315 [Urine:2600; Blood:1000; Chest Tube:370] Intake/Output this shift: Total I/O In: 138.2 [I.V.:88.2; IV Piggyback:50] Out: -        Exam    General- alert and comfortable   Lungs- clear without rales, wheezes   Cor- regular rate and rhythm, no murmur , gallop   Abdomen- soft, non-tender   Extremities - warm, non-tender, minimal edema   Neuro- oriented, appropriate, no focal weakness   Lab Results: Recent Labs    06/27/17 2144 06/27/17 2155 06/28/17 0303  WBC 15.7*  --  18.5*  HGB 11.0* 10.9* 10.8*  HCT 33.2* 32.0* 32.8*  PLT 193  --  207   BMET:  Recent Labs    06/27/17 0559  06/27/17 2155 06/28/17 0303  NA 139   < > 140 136  K 3.6   < > 3.8 3.6  CL 102   < > 103 107  CO2 30  --   --  23  GLUCOSE 186*   < > 185* 120*  BUN 16   < > 15 14   CREATININE 1.22   < > 1.00 1.13  CALCIUM 8.5*  --   --  7.8*   < > = values in this interval not displayed.    PT/INR:  Recent Labs    06/27/17 1525  LABPROT 16.2*  INR 1.32   ABG    Component Value Date/Time   PHART 7.396 06/28/2017 0511   HCO3 24.3 06/28/2017 0511   TCO2 26 06/28/2017 0511   ACIDBASEDEF 2.0 06/27/2017 2053   O2SAT 94.0 06/28/2017 0511   CBG (last 3)  Recent Labs    06/28/17 0605 06/28/17 0657 06/28/17 0759  GLUCAP 125* 111* 122*    Assessment/Plan: S/P Procedure(s) (LRB): CORONARY ARTERY BYPASS GRAFTING (CABG), time four   using the left internal mammary artery, and right saphenous leg vein harvested endoscopically. (N/A) TRANSESOPHAGEAL ECHOCARDIOGRAM (TEE) (N/A) CYSTOSCOPY FLEXIBLE, Balloon dilatation placement of foley catheter (N/A) Mobilize Diuresis Diabetes control See progression orders   LOS: 3 days    Larry [name] 06/28/2017

## 2017-06-29 ENCOUNTER — Inpatient Hospital Stay (HOSPITAL_COMMUNITY): Payer: Medicare Other

## 2017-06-29 LAB — BASIC METABOLIC PANEL
Anion gap: 6 (ref 5–15)
BUN: 16 mg/dL (ref 6–20)
CO2: 26 mmol/L (ref 22–32)
Calcium: 8 mg/dL — ABNORMAL LOW (ref 8.9–10.3)
Chloride: 103 mmol/L (ref 101–111)
Creatinine, Ser: 1.29 mg/dL — ABNORMAL HIGH (ref 0.61–1.24)
GFR calc Af Amer: >60 mL/min (ref >60)
GFR calc non Af Amer: 53 mL/min — ABNORMAL LOW (ref >60)
Glucose, Bld: 213 mg/dL — ABNORMAL HIGH (ref 65–99)
Potassium: 3.9 mmol/L (ref 3.5–5.1)
Sodium: 135 mmol/L (ref 135–145)

## 2017-06-29 LAB — CBC
HCT: 30.7 % — ABNORMAL LOW (ref 39.0–52.0)
Hemoglobin: 10 g/dL — ABNORMAL LOW (ref 13.0–17.0)
MCH: 29.9 pg (ref 26.0–34.0)
MCHC: 32.6 g/dL (ref 30.0–36.0)
MCV: 91.6 fL (ref 78.0–100.0)
Platelets: 176 10*3/uL (ref 150–400)
RBC: 3.35 MIL/uL — ABNORMAL LOW (ref 4.22–5.81)
RDW: 13.7 % (ref 11.5–15.5)
WBC: 14.8 10*3/uL — ABNORMAL HIGH (ref 4.0–10.5)

## 2017-06-29 LAB — GLUCOSE, CAPILLARY
GLUCOSE-CAPILLARY: 203 mg/dL — AB (ref 65–99)
GLUCOSE-CAPILLARY: 172 mg/dL — AB (ref 65–99)
Glucose-Capillary: 206 mg/dL — ABNORMAL HIGH (ref 65–99)
Glucose-Capillary: 245 mg/dL — ABNORMAL HIGH (ref 65–99)
Glucose-Capillary: 65 mg/dL (ref 65–99)
Glucose-Capillary: 89 mg/dL (ref 65–99)

## 2017-06-29 MED ORDER — METFORMIN HCL 500 MG PO TABS
1000.0000 mg | ORAL_TABLET | Freq: Two times a day (BID) | ORAL | Status: DC
  Administered 2017-06-29 – 2017-07-03 (×9): 1000 mg via ORAL
  Filled 2017-06-29 (×9): qty 2

## 2017-06-29 MED ORDER — GLIMEPIRIDE 4 MG PO TABS
2.0000 mg | ORAL_TABLET | Freq: Two times a day (BID) | ORAL | Status: DC
  Administered 2017-06-29 – 2017-07-03 (×9): 2 mg via ORAL
  Filled 2017-06-29 (×9): qty 1

## 2017-06-29 MED ORDER — INSULIN ASPART 100 UNIT/ML ~~LOC~~ SOLN: 0.0000 [IU] | Freq: Every day | SUBCUTANEOUS | Status: DC

## 2017-06-29 MED ORDER — INSULIN ASPART 100 UNIT/ML ~~LOC~~ SOLN
0.0000 [IU] | Freq: Three times a day (TID) | SUBCUTANEOUS | Status: DC
  Administered 2017-06-29 (×2): 7 [IU] via SUBCUTANEOUS
  Administered 2017-06-30 – 2017-07-02 (×6): 3 [IU] via SUBCUTANEOUS
  Administered 2017-07-02: 4 [IU] via SUBCUTANEOUS

## 2017-06-29 NOTE — Progress Notes (Signed)
Hypoglycemic Event  CBG: 67  Treatment: 15 GM carbohydrate snack  Symptoms: None  Follow-up CBG: Time: 2230 CBG Result: 89  Possible Reasons for Event: Inadequate meal intake  Comments/MD notified: Dr Roxan Hockey on rounds    Sigmund Hazel

## 2017-06-29 NOTE — Progress Notes (Signed)
      [address]       [place] [address]             [phone]      No complaints Stable day BP (!) 129/52   Pulse 83   Temp 99.1 F (37.3 C) (Oral)   Resp 18   Ht 6' (1.829 m)   Wt 255 lb 11.7 oz (116 kg)   SpO2 98%   BMI 34.68 kg/m   Intake/Output Summary (Last 24 hours) at 06/29/2017 1745 Last data filed at 06/29/2017 1700 Gross per 24 hour  Intake 1084.3 ml  Output 1665 ml  Net -580.7 ml   Will dc milrinone, check co-ox in AM  Birtie Fellman C. Roxan Hockey, MD [facility] [phone]

## 2017-06-29 NOTE — Progress Notes (Signed)
2 Days Post-Op Procedure(s) (LRB): CORONARY ARTERY BYPASS GRAFTING (CABG), time four   using the left internal mammary artery, and right saphenous leg vein harvested endoscopically. (N/A) TRANSESOPHAGEAL ECHOCARDIOGRAM (TEE) (N/A) CYSTOSCOPY FLEXIBLE, Balloon dilatation placement of foley catheter (N/A) Subjective: No complaints  Objective: Vital signs in last 24 hours: Temp:  [97.6 F (36.4 C)-99.1 F (37.3 C)] 98.3 F (36.8 C) (11/10 0823) Pulse Rate:  [65-104] 86 (11/10 0823) Cardiac Rhythm: Sinus tachycardia (11/10 0700) Resp:  [11-23] 21 (11/10 0823) BP: (103-151)/(52-106) 136/57 (11/10 0823) SpO2:  [94 %-100 %] 96 % (11/10 0823) Arterial Line BP: (115-173)/(42-61) 154/52 (11/09 1500) Weight:  [255 lb 11.7 oz (116 kg)] 255 lb 11.7 oz (116 kg) (11/10 0400)  Hemodynamic parameters for last 24 hours: PAP: (23-27)/(9-13) 27/13  Intake/Output from previous day: 11/09 0701 - 11/10 0700 In: 1880.1 [P.O.:840; I.V.:940.1; IV Piggyback:100] Out: 1525 [DHRCB:6384; Chest Tube:330] Intake/Output this shift: No intake/output data recorded.  General appearance: alert, cooperative and no distress Neurologic: intact Heart: regular rate and rhythm Lungs: diminished breath sounds bibasilar Abdomen: normal findings: soft, non-tender  Lab Results: Recent Labs    06/28/17 1629 06/28/17 1646 06/29/17 0428  WBC 18.9*  --  14.8*  HGB 10.6* 10.9* 10.0*  HCT 32.9* 32.0* 30.7*  PLT 205  --  176   BMET:  Recent Labs    06/28/17 0303 06/28/17 1646 06/28/17 2059 06/29/17 0428  NA 136 138  --  135  K 3.6 4.3  --  3.9  CL 107 100*  --  103  CO2 23  --   --  26  GLUCOSE 120* 140*  --  213*  BUN 14 16  --  16  CREATININE 1.13 1.30* 1.36* 1.29*  CALCIUM 7.8*  --   --  8.0*    PT/INR:  Recent Labs    06/27/17 1525  LABPROT 16.2*  INR 1.32   ABG    Component Value Date/Time   PHART 7.396 06/28/2017 0511   HCO3 24.3 06/28/2017 0511   TCO2 24 06/28/2017 1646   ACIDBASEDEF  2.0 06/27/2017 2053   O2SAT 94.0 06/28/2017 0511   CBG (last 3)  Recent Labs    06/28/17 2328 06/29/17 0403 06/29/17 0824  GLUCAP 152* 203* 172*    Assessment/Plan: S/P Procedure(s) (LRB): CORONARY ARTERY BYPASS GRAFTING (CABG), time four   using the left internal mammary artery, and right saphenous leg vein harvested endoscopically. (N/A) TRANSESOPHAGEAL ECHOCARDIOGRAM (TEE) (N/A) CYSTOSCOPY FLEXIBLE, Balloon dilatation placement of foley catheter (N/A) -POD # 2 CV- stable in SR  RESP- bibasilar atelectasis- continue IS  RENAL- creatinine stable at 1.29, lytes OK  Continue IV diuresis  ENDO- CBG elevated- resume PO meds, continue levemir and meal coverage  change to AC/HS SSI  Anemia secondary to ABL- stable, follow  Continue ambulation  Dc chest tubes  LOS: 4 days    Melrose Nakayama 06/29/2017

## 2017-06-30 ENCOUNTER — Inpatient Hospital Stay (HOSPITAL_COMMUNITY): Payer: Medicare Other

## 2017-06-30 LAB — BASIC METABOLIC PANEL WITH GFR
Anion gap: 7 (ref 5–15)
BUN: 19 mg/dL (ref 6–20)
CO2: 27 mmol/L (ref 22–32)
Calcium: 8 mg/dL — ABNORMAL LOW (ref 8.9–10.3)
Chloride: 100 mmol/L — ABNORMAL LOW (ref 101–111)
Creatinine, Ser: 1.19 mg/dL (ref 0.61–1.24)
GFR calc Af Amer: >60 mL/min
GFR calc non Af Amer: 58 mL/min — ABNORMAL LOW
Glucose, Bld: 134 mg/dL — ABNORMAL HIGH (ref 65–99)
Potassium: 3.8 mmol/L (ref 3.5–5.1)
Sodium: 134 mmol/L — ABNORMAL LOW (ref 135–145)

## 2017-06-30 LAB — GLUCOSE, CAPILLARY
GLUCOSE-CAPILLARY: 142 mg/dL — AB (ref 65–99)
Glucose-Capillary: 125 mg/dL — ABNORMAL HIGH (ref 65–99)
Glucose-Capillary: 150 mg/dL — ABNORMAL HIGH (ref 65–99)
Glucose-Capillary: 142 mg/dL — ABNORMAL HIGH (ref 65–99)

## 2017-06-30 LAB — COOXEMETRY PANEL
CARBOXYHEMOGLOBIN: 1.7 % — AB (ref 0.5–1.5)
Methemoglobin: 0.8 % (ref 0.0–1.5)
O2 Saturation: 57.2 %
Total hemoglobin: 9.9 g/dL — ABNORMAL LOW (ref 12.0–16.0)

## 2017-06-30 LAB — CBC
HCT: 30.2 % — ABNORMAL LOW (ref 39.0–52.0)
Hemoglobin: 10 g/dL — ABNORMAL LOW (ref 13.0–17.0)
MCH: 30.8 pg (ref 26.0–34.0)
MCHC: 33.1 g/dL (ref 30.0–36.0)
MCV: 92.9 fL (ref 78.0–100.0)
Platelets: 197 K/uL (ref 150–400)
RBC: 3.25 MIL/uL — ABNORMAL LOW (ref 4.22–5.81)
RDW: 14.1 % (ref 11.5–15.5)
WBC: 13.6 K/uL — ABNORMAL HIGH (ref 4.0–10.5)

## 2017-06-30 MED ORDER — IRBESARTAN 300 MG PO TABS
300.0000 mg | ORAL_TABLET | Freq: Every day | ORAL | Status: DC
  Administered 2017-06-30 – 2017-07-03 (×4): 300 mg via ORAL
  Filled 2017-06-30 (×4): qty 1

## 2017-06-30 MED ORDER — ENOXAPARIN SODIUM 40 MG/0.4ML ~~LOC~~ SOLN
40.0000 mg | SUBCUTANEOUS | Status: DC
  Administered 2017-06-30 – 2017-07-02 (×3): 40 mg via SUBCUTANEOUS
  Filled 2017-06-30 (×3): qty 0.4

## 2017-06-30 NOTE — Progress Notes (Signed)
      [address]       [place] [address]             [phone]      Feels better  Just back from walking around unit  BP 138/62 (BP Location: Left Arm)   Pulse 96   Temp 98.5 F (36.9 C) (Oral)   Resp 19   Ht 6' (1.829 m)   Wt 253 lb 4.9 oz (114.9 kg)   SpO2 100%   BMI 34.35 kg/m    Intake/Output Summary (Last 24 hours) at 06/30/2017 1749 Last data filed at 06/30/2017 1300 Gross per 24 hour  Intake 1244.1 ml  Output 1925 ml  Net -680.9 ml    Remo Lipps C. Roxan Hockey, MD [facility] [phone]

## 2017-06-30 NOTE — Progress Notes (Signed)
3 Days Post-Op Procedure(s) (LRB): CORONARY ARTERY BYPASS GRAFTING (CABG), time four   using the left internal mammary artery, and right saphenous leg vein harvested endoscopically. (N/A) TRANSESOPHAGEAL ECHOCARDIOGRAM (TEE) (N/A) CYSTOSCOPY FLEXIBLE, Balloon dilatation placement of foley catheter (N/A) Subjective: No complaints this AM  Objective: Vital signs in last 24 hours: Temp:  [97.5 F (36.4 C)-99.1 F (37.3 C)] 98.9 F (37.2 C) (11/11 0750) Pulse Rate:  [76-109] 103 (11/11 0750) Cardiac Rhythm: Normal sinus rhythm (11/10 2000) Resp:  [14-26] 20 (11/11 0750) BP: (118-159)/(52-75) 149/71 (11/11 0750) SpO2:  [93 %-100 %] 95 % (11/11 0750) Weight:  [253 lb 4.9 oz (114.9 kg)] 253 lb 4.9 oz (114.9 kg) (11/11 0600)  Hemodynamic parameters for last 24 hours:    Intake/Output from previous day: 11/10 0701 - 11/11 0700 In: 1345.1 [P.O.:840; I.V.:505.1] Out: 2085 [Urine:2025; Chest Tube:60] Intake/Output this shift: No intake/output data recorded.  General appearance: alert and no distress Neurologic: intact Heart: regular rate and rhythm Lungs: diminished breath sounds bibasilar Abdomen: normal findings: soft, non-tender  Lab Results: Recent Labs    06/29/17 0428 06/30/17 0336  WBC 14.8* 13.6*  HGB 10.0* 10.0*  HCT 30.7* 30.2*  PLT 176 197   BMET:  Recent Labs    06/29/17 0428 06/30/17 0336  NA 135 134*  K 3.9 3.8  CL 103 100*  CO2 26 27  GLUCOSE 213* 134*  BUN 16 19  CREATININE 1.29* 1.19  CALCIUM 8.0* 8.0*    PT/INR:  Recent Labs    06/27/17 1525  LABPROT 16.2*  INR 1.32   ABG    Component Value Date/Time   PHART 7.396 06/28/2017 0511   HCO3 24.3 06/28/2017 0511   TCO2 24 06/28/2017 1646   ACIDBASEDEF 2.0 06/27/2017 2053   O2SAT 57.2 06/30/2017 0340   CBG (last 3)  Recent Labs    06/29/17 2145 06/29/17 2233 06/30/17 0748  GLUCAP 65 89 142*    Assessment/Plan: S/P Procedure(s) (LRB): CORONARY ARTERY BYPASS GRAFTING (CABG), time  four   using the left internal mammary artery, and right saphenous leg vein harvested endoscopically. (N/A) TRANSESOPHAGEAL ECHOCARDIOGRAM (TEE) (N/A) CYSTOSCOPY FLEXIBLE, Balloon dilatation placement of foley catheter (N/A) -Doing well  CV- in SR  Hypertension- on lopressor 25 BID will restart ARB  RESP- bibasilar atelectasis- continue IS, CXR looks better today  RENAL- beginning to diurese better   Creatinine back to normal range  ENDO- CBG well controlled  SCD + enoxaparin for DVT prophylaxis  Continue cardiac rehab   LOS: 5 days    Larry [name] 06/30/2017

## 2017-06-30 NOTE — Plan of Care (Signed)
Pt showing no signs of difficulty with advancement of activity OOB to chair for meals and ambulating 3x's per day without increase in work of breathing.  Maintaining adequate hemodynamic after weaning off Milrinone, at time requiring PRN Lopressor for SBP > 150.  Pt able to demonstrates C/DB exercises and proper use of splinting pillow. Improving on incentive goals.

## 2017-07-01 ENCOUNTER — Inpatient Hospital Stay (HOSPITAL_COMMUNITY): Payer: Medicare Other

## 2017-07-01 ENCOUNTER — Encounter: Payer: Self-pay | Admitting: Nurse Practitioner

## 2017-07-01 DIAGNOSIS — Z951 Presence of aortocoronary bypass graft: Secondary | ICD-10-CM

## 2017-07-01 LAB — BASIC METABOLIC PANEL
ANION GAP: 7 (ref 5–15)
BUN: 25 mg/dL — ABNORMAL HIGH (ref 6–20)
CHLORIDE: 100 mmol/L — AB (ref 101–111)
CO2: 30 mmol/L (ref 22–32)
Calcium: 8.3 mg/dL — ABNORMAL LOW (ref 8.9–10.3)
Creatinine, Ser: 1.32 mg/dL — ABNORMAL HIGH (ref 0.61–1.24)
GFR calc non Af Amer: 51 mL/min — ABNORMAL LOW (ref >60)
GFR, EST AFRICAN AMERICAN: 59 mL/min — AB (ref >60)
Glucose, Bld: 124 mg/dL — ABNORMAL HIGH (ref 65–99)
POTASSIUM: 3.6 mmol/L (ref 3.5–5.1)
SODIUM: 137 mmol/L (ref 135–145)

## 2017-07-01 LAB — CBC
HEMATOCRIT: 31.3 % — AB (ref 39.0–52.0)
HEMOGLOBIN: 10.3 g/dL — AB (ref 13.0–17.0)
MCH: 30.4 pg (ref 26.0–34.0)
MCHC: 32.9 g/dL (ref 30.0–36.0)
MCV: 92.3 fL (ref 78.0–100.0)
Platelets: 250 10*3/uL (ref 150–400)
RBC: 3.39 MIL/uL — AB (ref 4.22–5.81)
RDW: 13.9 % (ref 11.5–15.5)
WBC: 13.5 10*3/uL — AB (ref 4.0–10.5)

## 2017-07-01 LAB — GLUCOSE, CAPILLARY
GLUCOSE-CAPILLARY: 124 mg/dL — AB (ref 65–99)
GLUCOSE-CAPILLARY: 104 mg/dL — AB (ref 65–99)
Glucose-Capillary: 122 mg/dL — ABNORMAL HIGH (ref 65–99)
Glucose-Capillary: 114 mg/dL — ABNORMAL HIGH (ref 65–99)

## 2017-07-01 MED ORDER — SORBITOL 70 % PO SOLN
60.0000 mL | Freq: Once | ORAL | Status: AC
  Administered 2017-07-01: 60 mL via ORAL
  Filled 2017-07-01: qty 60

## 2017-07-01 MED ORDER — POTASSIUM CHLORIDE CRYS ER 20 MEQ PO TBCR
20.0000 meq | EXTENDED_RELEASE_TABLET | Freq: Every day | ORAL | Status: DC
  Administered 2017-07-01 – 2017-07-03 (×3): 20 meq via ORAL
  Filled 2017-07-01 (×3): qty 1

## 2017-07-01 MED ORDER — METOPROLOL TARTRATE 50 MG PO TABS
50.0000 mg | ORAL_TABLET | Freq: Two times a day (BID) | ORAL | Status: DC
  Administered 2017-07-01 – 2017-07-03 (×5): 50 mg via ORAL
  Filled 2017-07-01 (×5): qty 1

## 2017-07-01 MED ORDER — MOVING RIGHT ALONG BOOK
Freq: Once | Status: AC
  Administered 2017-07-01: 13:00:00
  Filled 2017-07-01: qty 1

## 2017-07-01 MED ORDER — SODIUM CHLORIDE 0.9% FLUSH: 3.0000 mL | INTRAVENOUS | Status: DC | PRN

## 2017-07-01 MED ORDER — POTASSIUM CHLORIDE CRYS ER 20 MEQ PO TBCR
20.0000 meq | EXTENDED_RELEASE_TABLET | ORAL | Status: DC | PRN
  Administered 2017-07-01: 20 meq via ORAL
  Filled 2017-07-01: qty 1

## 2017-07-01 MED ORDER — FUROSEMIDE 40 MG PO TABS
40.0000 mg | ORAL_TABLET | Freq: Every day | ORAL | Status: DC
  Administered 2017-07-02 – 2017-07-03 (×2): 40 mg via ORAL
  Filled 2017-07-01 (×2): qty 1

## 2017-07-01 MED ORDER — MAGNESIUM HYDROXIDE 400 MG/5ML PO SUSP: 30.0000 mL | Freq: Every day | ORAL | Status: DC | PRN

## 2017-07-01 MED ORDER — SODIUM CHLORIDE 0.9 % IV SOLN: 250.0000 mL | INTRAVENOUS | Status: DC | PRN

## 2017-07-01 MED ORDER — SODIUM CHLORIDE 0.9% FLUSH
3.0000 mL | Freq: Two times a day (BID) | INTRAVENOUS | Status: DC
  Administered 2017-07-01 – 2017-07-03 (×4): 3 mL via INTRAVENOUS

## 2017-07-01 NOTE — Progress Notes (Addendum)
4 Days Post-Op Procedure(s) (LRB): CORONARY ARTERY BYPASS GRAFTING (CABG), time four   using the left internal mammary artery, and right saphenous leg vein harvested endoscopically. (N/A) TRANSESOPHAGEAL ECHOCARDIOGRAM (TEE) (N/A) CYSTOSCOPY FLEXIBLE, Balloon dilatation placement of foley catheter (N/A) Subjective: nsr Doing well At preop wt Objective: Vital signs in last 24 hours: Temp:  [97.7 F (36.5 C)-99.3 F (37.4 C)] 97.7 F (36.5 C) (11/12 0758) Pulse Rate:  [75-106] 93 (11/12 0700) Cardiac Rhythm: Normal sinus rhythm (11/11 2000) Resp:  [12-22] 15 (11/12 0700) BP: (111-161)/(55-83) 134/66 (11/12 0700) SpO2:  [93 %-100 %] 93 % (11/12 0700) Weight:  [248 lb 14.4 oz (112.9 kg)] 248 lb 14.4 oz (112.9 kg) (11/12 0600)  Hemodynamic parameters for last 24 hours:   stable Intake/Output from previous day: 11/11 0701 - 11/12 0700 In: 1080 [P.O.:1080] Out: 2475 [Urine:2475] Intake/Output this shift: No intake/output data recorded.       Exam    General- alert and comfortable   Lungs- clear without rales, wheezes   Cor- regular rate and rhythm, no murmur , gallop   Abdomen- soft, non-tender   Extremities - warm, non-tender, minimal edema   Neuro- oriented, appropriate, no focal weakness   Lab Results: Recent Labs    06/30/17 0336 07/01/17 0222  WBC 13.6* 13.5*  HGB 10.0* 10.3*  HCT 30.2* 31.3*  PLT 197 250   BMET:  Recent Labs    06/30/17 0336 07/01/17 0222  NA 134* 137  K 3.8 3.6  CL 100* 100*  CO2 27 30  GLUCOSE 134* 124*  BUN 19 25*  CREATININE 1.19 1.32*  CALCIUM 8.0* 8.3*    PT/INR: No results for input(s): LABPROT, INR in the last 72 hours. ABG    Component Value Date/Time   PHART 7.396 06/28/2017 0511   HCO3 24.3 06/28/2017 0511   TCO2 24 06/28/2017 1646   ACIDBASEDEF 2.0 06/27/2017 2053   O2SAT 57.2 06/30/2017 0340   CBG (last 3)  Recent Labs    06/30/17 1208 06/30/17 1641 06/30/17 2117  GLUCAP 125* 150* 142*     Assessment/Plan: S/P Procedure(s) (LRB): CORONARY ARTERY BYPASS GRAFTING (CABG), time four   using the left internal mammary artery, and right saphenous leg vein harvested endoscopically. (N/A) TRANSESOPHAGEAL ECHOCARDIOGRAM (TEE) (N/A) CYSTOSCOPY FLEXIBLE, Balloon dilatation placement of foley catheter (N/A) Mobilize Diuresis Diabetes control transfer to 4 East BP control CTA of carotid arteries in 48 hrs- VVS following L carotid 80-99%  LOS: 6 days    Larry [name] 07/01/2017

## 2017-07-01 NOTE — Progress Notes (Addendum)
Patient ID: Larry [name], male   DOB: 06/29/1941, 76 y.o.   MRN: [id number] EVENING ROUNDS NOTE :     [address]       [place] [address]             [phone]                 4 Days Post-Op Procedure(s) (LRB): CORONARY ARTERY BYPASS GRAFTING (CABG), time four   using the left internal mammary artery, and right saphenous leg vein harvested endoscopically. (N/A) TRANSESOPHAGEAL ECHOCARDIOGRAM (TEE) (N/A) CYSTOSCOPY FLEXIBLE, Balloon dilatation placement of foley catheter (N/A)  Total Length of Stay:  LOS: 6 days  BP (!) 155/71 (BP Location: Left Arm)   Pulse 82   Temp 98.1 F (36.7 C) (Oral)   Resp 17   Ht 6' (1.829 m)   Wt 248 lb 14.4 oz (112.9 kg)   SpO2 96%   BMI 33.76 kg/m   .Intake/Output      11/11 0701 - 11/12 0700 11/12 0701 - 11/13 0700   P.O. 1080 240   I.V. (mL/kg)     Other  275   Total Intake(mL/kg) 1080 (9.6) 515 (4.6)   Urine (mL/kg/hr) 2475 (0.9)    Chest Tube     Total Output 2475    Net -1395 +515        Urine Occurrence  1 x   Stool Occurrence  2 x     . sodium chloride Stopped (06/28/17 1000)  . sodium chloride    . lactated ringers Stopped (06/30/17 0700)     Lab Results  Component Value Date   WBC 13.5 (H) 07/01/2017   HGB 10.3 (L) 07/01/2017   HCT 31.3 (L) 07/01/2017   PLT 250 07/01/2017   GLUCOSE 124 (H) 07/01/2017   CHOL 128 06/18/2017   TRIG 105.0 06/18/2017   HDL 40.40 06/18/2017   LDLCALC 66 06/18/2017   ALT 17 06/18/2017   AST 15 06/18/2017   NA 137 07/01/2017   K 3.6 07/01/2017   CL 100 (L) 07/01/2017   CREATININE 1.32 (H) 07/01/2017   BUN 25 (H) 07/01/2017   CO2 30 07/01/2017   TSH 0.879 06/25/2017   PSA 0.00 Repeated and verified X2. (L) 12/26/2012   INR 1.32 06/27/2017   HGBA1C 7.1 (H) 06/25/2017   Waiting for bed step down Concerned about previous reaction to contrast when he had cath with pending ct carotid.  Grace Isaac MD  Beeper [phone] Office [phone] 07/01/2017 5:16  PM

## 2017-07-01 NOTE — Plan of Care (Signed)
Pt maintaining O2 sats > 96 % on room air. Continues to demonstrate good pulm. hygiene spontaneously. No increase in WOB with ambulations 3 times daily.  Pt diuresing well on ordered doses of lasix, returning to pre-op weight per Pt report. Daily fluid balance (-) without effects on hemodynamics.

## 2017-07-01 NOTE — Care Management Note (Signed)
Case Management Note Original Note Created by Tomi Bamberger  Patient Details  Name: Larry [name] MRN: [id number] Date of Birth: 10-Nov-1940  Subjective/Objective:   From home with spouse, POD 1 CABG, conts with chest tubes, diuresing.                    Action/Plan: NCM will follow for dc needs.   Expected Discharge Date:                  Expected Discharge Plan:  [place]  In-House Referral:     Discharge planning Services  CM Consult  Post Acute Care Choice:    Choice offered to:     DME Arranged:    DME Agency:     HH Arranged:    [place] Agency:     Status of Service:  In process, will continue to follow  If discussed at Long Length of Stay Meetings, dates discussed:    Additional Comments: 07/01/2017 CM spoke with pt -  pts wife will provide 24 hour supervision at discharge.  Pt was completely independent from home PTA.  CM will continue to follow for discharge needs [name], RN 07/01/2017, 10:00 AM

## 2017-07-01 NOTE — Progress Notes (Signed)
K+= 3.6 and creat= 1.32 w/ urine o/p > 30cc/hr; TCTS KCL protocol initiated with 20 mEq KDur PO x 3 doses, every 4 hours.

## 2017-07-02 ENCOUNTER — Inpatient Hospital Stay (HOSPITAL_COMMUNITY): Payer: Medicare Other

## 2017-07-02 LAB — BASIC METABOLIC PANEL
Anion gap: 9 (ref 5–15)
BUN: 27 mg/dL — ABNORMAL HIGH (ref 6–20)
CO2: 26 mmol/L (ref 22–32)
Calcium: 8.6 mg/dL — ABNORMAL LOW (ref 8.9–10.3)
Chloride: 102 mmol/L (ref 101–111)
Creatinine, Ser: 1.23 mg/dL (ref 0.61–1.24)
GFR calc Af Amer: >60 mL/min (ref >60)
GFR calc non Af Amer: 56 mL/min — ABNORMAL LOW (ref >60)
Glucose, Bld: 91 mg/dL (ref 65–99)
Potassium: 3.5 mmol/L (ref 3.5–5.1)
Sodium: 137 mmol/L (ref 135–145)

## 2017-07-02 LAB — GLUCOSE, CAPILLARY
GLUCOSE-CAPILLARY: 107 mg/dL — AB (ref 65–99)
GLUCOSE-CAPILLARY: 181 mg/dL — AB (ref 65–99)
GLUCOSE-CAPILLARY: 123 mg/dL — AB (ref 65–99)
GLUCOSE-CAPILLARY: 98 mg/dL (ref 65–99)
Glucose-Capillary: 51 mg/dL — ABNORMAL LOW (ref 65–99)

## 2017-07-02 LAB — CBC
HCT: 29.5 % — ABNORMAL LOW (ref 39.0–52.0)
Hemoglobin: 10 g/dL — ABNORMAL LOW (ref 13.0–17.0)
MCH: 31.3 pg (ref 26.0–34.0)
MCHC: 33.9 g/dL (ref 30.0–36.0)
MCV: 92.5 fL (ref 78.0–100.0)
Platelets: 293 10*3/uL (ref 150–400)
RBC: 3.19 MIL/uL — ABNORMAL LOW (ref 4.22–5.81)
RDW: 14.2 % (ref 11.5–15.5)
WBC: 12.8 10*3/uL — ABNORMAL HIGH (ref 4.0–10.5)

## 2017-07-02 IMAGING — CR DG CHEST 2V
2 series · 2 of 2 positions shown · non-contrast
Comparison: [DATE] and earlier.

CLINICAL DATA: 76-year-old male status post CABG on [DATE].

EXAM:
CHEST  2 VIEW

[chest pa]
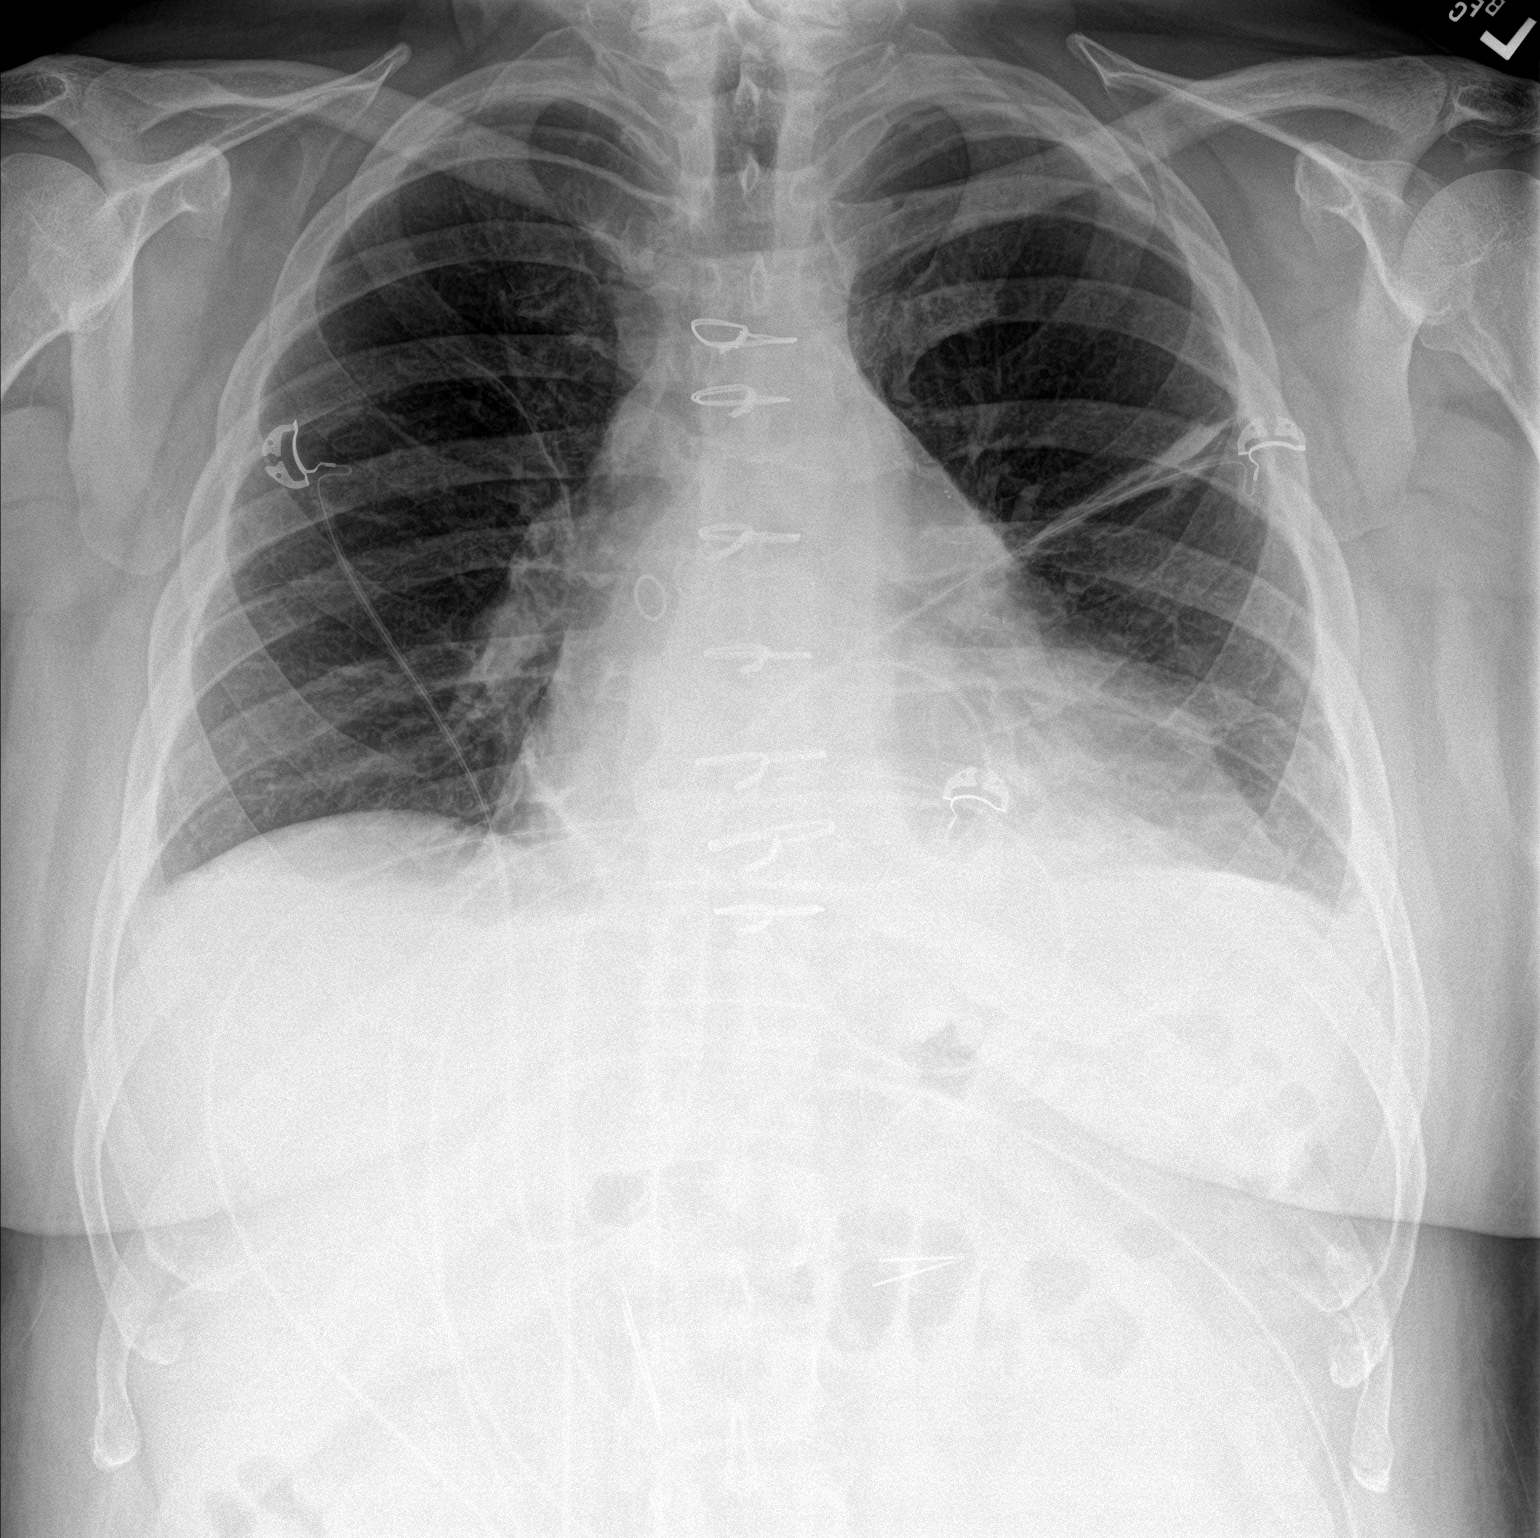

[chest lat]
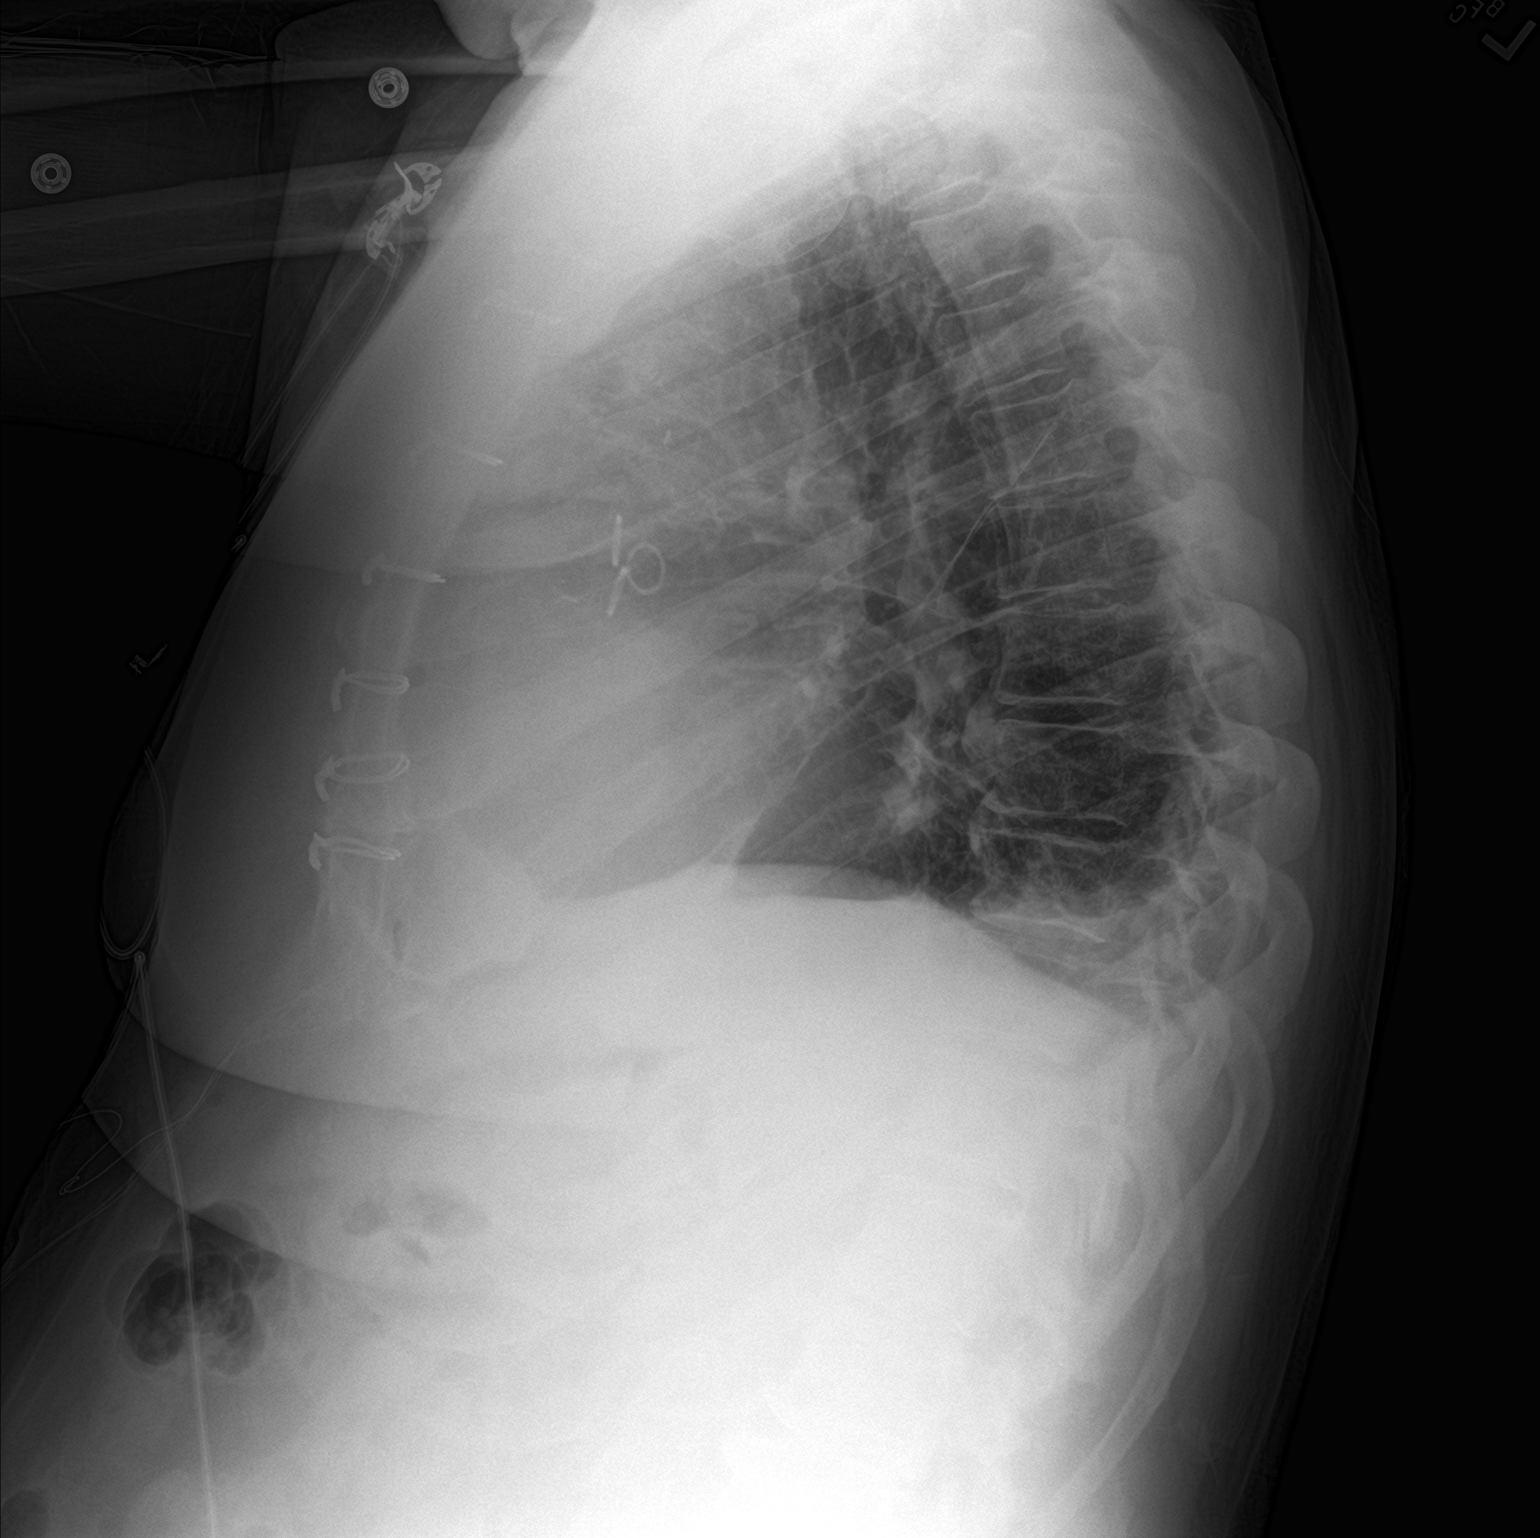

[2 of 2 positions shown; findings below may reference images not displayed]

FINDINGS: Epicardial pacer wires remain in place. Lung volumes continue to be
lower than on the preoperative chest radiographs. Stable cardiac
size and mediastinal contours. Sequelae of CABG. Linear atelectasis
tracking from the left hilum is stable. There are small bilateral
pleural effusions. Additional streaky left lung base opacity. No
pneumothorax or pulmonary edema.

Negative visible bowel gas pattern. Stable visualized osseous
structures.
IMPRESSION: 1. Small bilateral pleural effusions.
2. Superimposed multifocal streaky opacity in the left lung most
resembles atelectasis.

## 2017-07-02 MED ORDER — POTASSIUM CHLORIDE CRYS ER 20 MEQ PO TBCR
20.0000 meq | EXTENDED_RELEASE_TABLET | ORAL | Status: AC
  Administered 2017-07-02 (×3): 20 meq via ORAL
  Filled 2017-07-02 (×3): qty 1

## 2017-07-02 NOTE — Discharge Instructions (Signed)
Coronary Artery Bypass Grafting, Care After This sheet gives you information about how to care for yourself after your procedure. Your health care provider may also give you more specific instructions. If you have problems or questions, contact your health care provider. What can I expect after the procedure? After the procedure, it is common to have:  Nausea and a lack of appetite.  Constipation.  Weakness and fatigue.  Depression or irritability.  Pain or discomfort in your incision areas.  Follow these instructions at home: Medicines  Take over-the-counter and prescription medicines only as told by your health care provider. Do not stop taking medicines or start any new medicines without approval from your health care provider.  If you were prescribed an antibiotic medicine, take it as told by your health care provider. Do not stop taking the antibiotic even if you start to feel better.  Do not drive or use heavy machinery while taking prescription pain medicine. Incision care  Follow instructions from your health care provider about how to take care of your incisions. Make sure you: ? Wash your hands with soap and water before you change your bandage (dressing). If soap and water are not available, use hand sanitizer. ? Change your dressing as told by your health care provider. ? Leave stitches (sutures), skin glue, or adhesive strips in place. These skin closures may need to stay in place for 2 weeks or longer. If adhesive strip edges start to loosen and curl up, you may trim the loose edges. Do not remove adhesive strips completely unless your health care provider tells you to do that.  Keep incision areas clean, dry, and protected.  Check your incision areas every day for signs of infection. Check for: ? More redness, swelling, or pain. ? More fluid or blood. ? Warmth. ? Pus or a bad smell.  If incisions were made in your legs: ? Avoid crossing your legs. ? Avoid  sitting for long periods of time. Change positions every 30 minutes. ? Raise (elevate) your legs when you are sitting. Bathing  Do not take baths, swim, or use a hot tub until your health care provider approves.  Only take sponge baths. Pat the incisions dry. Do not rub incisions with a washcloth or towel.  Ask your health care provider when you can shower. Eating and drinking  Eat foods that are high in fiber, such as raw fruits and vegetables, whole grains, beans, and nuts. Meats should be lean cut. Avoid canned, processed, and fried foods. This can help prevent constipation and is a recommended part of a heart-healthy diet.  Drink enough fluid to keep your urine clear or pale yellow.  Limit alcohol intake to no more than 1 drink a day for nonpregnant women and 2 drinks a day for men. One drink equals 12 oz of beer, 5 oz of wine, or 1 oz of hard liquor. Activity  Rest and limit your activity as told by your health care provider. You may be instructed to: ? Stop any activity right away if you have chest pain, shortness of breath, irregular heartbeats, or dizziness. Get help right away if you have any of these symptoms. ? Move around frequently for short periods or take short walks as directed by your health care provider. Gradually increase your activities. You may need physical therapy or cardiac rehabilitation to help strengthen your muscles and build your endurance. ? Avoid lifting, pushing, or pulling anything that is heavier than 10 lb (4.5 kg) for at  least 6 weeks or as told by your health care provider. °· Do not drive until your health care provider approves. °· Ask your health care provider when you may return to work. °· Ask your health care provider when you may resume sexual activity. °General instructions °· Do not use any products that contain nicotine or tobacco, such as cigarettes and e-cigarettes. If you need help quitting, ask your health care provider. °· Take 2-3 deep  breaths every few hours during the day, while you recover. This helps expand your lungs and prevent complications like pneumonia after surgery. °· If you were given a device called an incentive spirometer, use it several times a day to practice deep breathing. Support your chest with a pillow or your arms when you take deep breaths or cough. °· Wear compression stockings as told by your health care provider. These stockings help to prevent blood clots and reduce swelling in your legs. °· Weigh yourself every day. This helps identify if your body is holding (retaining) fluid that may make your heart and lungs work harder. °· Keep all follow-up visits as told by your health care provider. This is important. °Contact a health care provider if: °· You have more redness, swelling, or pain around any incision. °· You have more fluid or blood coming from any incision. °· Any incision feels warm to the touch. °· You have pus or a bad smell coming from any incision °· You have a fever. °· You have swelling in your ankles or legs. °· You have pain in your legs. °· You gain 2 lb (0.9 kg) or more a day. °· You are nauseous or you vomit. °· You have diarrhea. °Get help right away if: °· You have chest pain that spreads to your jaw or arms. °· You are short of breath. °· You have a fast or irregular heartbeat. °· You notice a "clicking" in your breastbone (sternum) when you move. °· You have numbness or weakness in your arms or legs. °· You feel dizzy or light-headed. °Summary °· After the procedure, it is common to have pain or discomfort in the incision areas. °· Do not take baths, swim, or use a hot tub until your health care provider approves. °· Gradually increase your activities. You may need physical therapy or cardiac rehabilitation to help strengthen your muscles and build your endurance. °· Weigh yourself every day. This helps identify if your body is holding (retaining) fluid that may make your heart and lungs work  harder. °This information is not intended to replace advice given to you by your health care provider. Make sure you discuss any questions you have with your health care provider. °Document Released: 02/23/2005 Document Revised: 06/25/2016 Document Reviewed: 06/25/2016 °Elsevier Interactive Patient Education © 2018 [employer] ° ° °Endoscopic Saphenous Vein Harvesting, Care After °Refer to this sheet in the next few weeks. These instructions provide you with information about caring for yourself after your procedure. Your health care provider may also give you more specific instructions. Your treatment has been planned according to current medical practices, but problems sometimes occur. Call your health care provider if you have any problems or questions after your procedure. °What can I expect after the procedure? °After the procedure, it is common to have: °· Pain. °· Bruising. °· Swelling. °· Numbness. ° °Follow these instructions at home: °Medicine °· Take over-the-counter and prescription medicines only as told by your health care provider. °· Do not drive or operate heavy machinery   while taking prescription pain medicine. °Incision care ° °· Follow instructions from your health care provider about how to take care of the cut made during surgery (incision). Make sure you: °? Wash your hands with soap and water before you change your bandage (dressing). If soap and water are not available, use hand sanitizer. °? Change your dressing as told by your health care provider. °? Leave stitches (sutures), skin glue, or adhesive strips in place. These skin closures may need to be in place for 2 weeks or longer. If adhesive strip edges start to loosen and curl up, you may trim the loose edges. Do not remove adhesive strips completely unless your health care provider tells you to do that. °· Check your incision area every day for signs of infection. Check for: °? More redness, swelling, or pain. °? More fluid or  blood. °? Warmth. °? Pus or a bad smell. °General instructions °· Raise (elevate) your legs above the level of your heart while you are sitting or lying down. °· Do any exercises your health care providers have given you. These may include deep breathing, coughing, and walking exercises. °· Do not shower, take baths, swim, or use a hot tub unless told by your health care provider. °· Wear your elastic stocking if told by your health care provider. °· Keep all follow-up visits as told by your health care provider. This is important. °Contact a health care provider if: °· Medicine does not help your pain. °· Your pain gets worse. °· You have new leg bruises or your leg bruises get bigger. °· You have a fever. °· Your leg feels numb. °· You have more redness, swelling, or pain around your incision. °· You have more fluid or blood coming from your incision. °· Your incision feels warm to the touch. °· You have pus or a bad smell coming from your incision. °Get help right away if: °· Your pain is severe. °· You develop pain, tenderness, warmth, redness, or swelling in any part of your leg. °· You have chest pain. °· You have trouble breathing. °This information is not intended to replace advice given to you by your health care provider. Make sure you discuss any questions you have with your health care provider. °Document Released: 04/18/2011 Document Revised: 01/12/2016 Document Reviewed: 06/20/2015 °Elsevier Interactive Patient Education © 2018 Elsevier Inc. ° ° °

## 2017-07-02 NOTE — Discharge Summary (Signed)
Physician Discharge Summary  Patient ID: Larry [name] MRN: [id number] DOB/AGE: 08-20-1941 76 y.o.  Admit date: 06/24/2017 Discharge date: 07/04/2017  Admission Diagnoses:  Patient Active Problem List   Diagnosis Date Noted  . CAD in native artery 06/24/2017  . Midsternal chest pain 06/18/2017  . DOE (dyspnea on exertion) 06/18/2017  . Migraine with aura 11/21/2016  . Cramp and spasm 04/06/2016  . Sciatic radiculitis 03/23/2016  . CAP (community acquired pneumonia) 08/26/2015  . Cough 08/23/2015  . Wheezing 08/23/2015  . Bruising 09/01/2013  . Sinusitis 08/27/2012  . Cerumen impaction 08/27/2012  . Edema 04/23/2012  . Well adult exam 07/13/2011  . OBSTRUCTIVE SLEEP APNEA 10/23/2010  . LOW BACK PAIN, ACUTE 07/19/2009  . DM2 (diabetes mellitus, type 2) ([place]) 06/24/2007  . Dyslipidemia 06/24/2007  . Essential hypertension 06/24/2007  . PROSTATE CANCER, HX OF 06/24/2007   Discharge Diagnoses:   Patient Active Problem List   Diagnosis Date Noted  . 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 CAD in native artery 06/24/2017  . Midsternal chest pain 06/18/2017  . DOE (dyspnea on exertion) 06/18/2017  . Migraine with aura 11/21/2016  . Cramp and spasm 04/06/2016  . Sciatic radiculitis 03/23/2016  . CAP (community acquired pneumonia) 08/26/2015  . Cough 08/23/2015  . Wheezing 08/23/2015  . Bruising 09/01/2013  . Sinusitis 08/27/2012  . Cerumen impaction 08/27/2012  . Edema 04/23/2012  . Well adult exam 07/13/2011  . OBSTRUCTIVE SLEEP APNEA 10/23/2010  . LOW BACK PAIN, ACUTE 07/19/2009  . DM2 (diabetes mellitus, type 2) ([place]) 06/24/2007  . Dyslipidemia 06/24/2007  . Essential hypertension 06/24/2007  . PROSTATE CANCER, HX OF 06/24/2007   Discharged Condition: good  History of Present Illness:  Larry [name] is a 76 yo white male with history of HTN, Diabetes, Hyperlipidemia, and OSA.  He was in his normal state of health until the past 6-8 months at which time he developed exertional  fatigue and chest tightness.  More recently he noticed increased tightness and extreme shortness of breath during sexual intercourse.  He has decreased his activity level to hopefully avoid experiencing the symptoms.  He was referred for Cardiology evaluation.  He was evaluated by Dr. Tamala Julian who started the patient on ASA and prn NTG.  It was also felt he should undergo coronary catheterization with possible intervention.  This was done on 06/24/2017 and revealed multivessel CAD with a reduced EF of 35-40%.  It was felt coronary bypass surgery would be indicated.  He was started on IV NTG for blood pressure control and admitted for further care.   Hospital Course:   The patient remained chest pain free.  He was evaluated by Dr. Prescott Gum who felt he would benefit from coronary bypass grafting.  The risks and benefits of the procedure were explained to the patient and he was agreeable to proceed.  He was taken to the operating room and underwent CABG x 4 utilizing LIMA to LAD, SVG to Ramus Intermediate, SVG to OM, and SVG to RCA.  He also underwent endoscopic harvest greater saphenous vein from his right leg.  He required cystoscopy, flexible with foley placement.  He tolerated the procedure and was taken to the SICU in stable condition.  The patient was extubated the evening of surgery.  During his stay in the SICU the patient was weaned off drips as tolerated.  Her chest tubes and arterial lines were removed without difficulty.  He was aggressively diuresed for hypervolemia.  He was hypertensive and restarted  on home ARB.  The patient was maintaining NSR.  He was transferred to the telemetry unit on 07/02/2017.  The patient continued to make progress.  His pacing wires were removed without difficulty.  He remains in NSR with occasional PVCs.  He was hypertensive and his ARB was titrated to his home dose.  He is ambulating without difficulty.  His blood sugars are controlled.  He is medically stable for discharge  home today.       Significant Diagnostic Studies: angiography:    Significant ostial left main in the 50-70% range.  Attempted intravascular ultrasound but the probe would not pass beyond the ostium of the left main.  Selective engagement produced ventricularization and pressure damping with reproduction of symptoms that the patient has clinically.  75-80% calcified mid LAD stenosis and 90+ percent stenosis in the second diagonal.  Moderate first and second obtuse marginal stenoses.  Dominant right coronary with eccentric 40-50% mid vessel narrowing.  Left ventricular global hypokinesis with estimated EF 35-40%.  Elevated end-diastolic pressure consistent with chronic combined systolic and diastolic heart failure. LV dysfunction may be out of proportion for the coronary disease identified.  Treatments: surgery:   1. Coronary artery bypass grafting x4 (left internal mammary artery to LAD, saphenous vein graft to OM, saphenous vein graft to ramus intermediate, saphenous vein graft to RCA). 2. Endoscopic harvest of right leg greater saphenous vein.  Disposition: Home  Discharge Medications:  The patient has been discharged on:   1.Beta Blocker:  Yes [ x  ]                              No   [   ]                              If No, reason:  2.Ace Inhibitor/ARB: Yes [ x  ]                                     No  [    ]                                     If No, reason:  3.Statin:   Yes [ x  ]                  No  [   ]                  If No, reason:  4.Ecasa:  Yes  [x   ]                  No   [   ]                  If No, reason:     Discharge Instructions    Amb Referral to Cardiac Rehabilitation   Complete by:  As directed    Diagnosis:  CABG   CABG X ___:  4     Allergies as of 07/03/2017      Reactions   Amlodipine    Leg swelling   Clindamycin Hcl    Verapamil    Edema w/high dose  Medication List    TAKE these medications   ACCU-CHEK FASTCLIX  LANCETS Misc 1 each by Does not apply route 2 (two) times daily. Dx:E11.9   aspirin 325 MG EC tablet Take 1 tablet (325 mg total) daily by mouth. What changed:    medication strength  how much to take   furosemide 40 MG tablet Commonly known as:  LASIX Take 1 tablet (40 mg total) daily by mouth. What changed:    when to take this  reasons to take this   glimepiride 2 MG tablet Commonly known as:  AMARYL Take 1 tablet (2 mg total) by mouth 2 (two) times daily. What changed:  when to take this   glucose blood test strip 1 each by Other route 2 (two) times daily. Please dispense brand of choice per pt and insurance preference. Dx:250.00   glucose blood test strip Commonly known as:  ACCU-CHEK SMARTVIEW 1 each by Other route 2 (two) times daily. Use to check blood sugars twice a day Dx E11.9   glucose monitoring kit monitoring kit 1 each by Does not apply route as needed for other. Please dispense brand of choice per ins/patient. Dx: 250.00.   Krill Oil 300 MG Caps Take 300 mg by mouth daily.   metFORMIN 1000 MG tablet Commonly known as:  GLUCOPHAGE Take 1 tablet (1,000 mg total) by mouth 2 (two) times daily with a meal.   metoprolol tartrate 50 MG tablet Commonly known as:  LOPRESSOR Take 1 tablet (50 mg total) 2 (two) times daily by mouth.   MH MACULAR HEALTH PO Take 1 tablet by mouth 2 (two) times daily.   nitroGLYCERIN 0.4 MG SL tablet Commonly known as:  NITROSTAT Place 1 tablet (0.4 mg total) under the tongue every 5 (five) minutes as needed for chest pain.   potassium chloride SA 20 MEQ tablet Commonly known as:  K-DUR,KLOR-CON Take 1 tablet (20 mEq total) daily by mouth.   simvastatin 40 MG tablet Commonly known as:  ZOCOR Take 1 tablet (40 mg total) by mouth at bedtime.   tamsulosin 0.4 MG Caps capsule Commonly known as:  FLOMAX Take 1 capsule (0.4 mg total) daily by mouth.   traMADol 50 MG tablet Commonly known as:  ULTRAM Take 1-2 tablets  (50-100 mg total) every 4 (four) hours as needed by mouth for moderate pain.   vitamin C 1000 MG tablet Take 1,000 mg by mouth daily.   Vitamin D3 1000 units Caps Take 1 capsule by mouth daily.      Follow-up Information    Ivin Poot, MD Follow up on 07/31/2017.   Specialty:  Cardiothoracic Surgery Why:  Appointment is at 11:30, please get CXR at 11:00 at [facility] imaging located on first floor of our office building  Contact information: [place] [address] [phone]        Grace Isaac, MD Follow up on 07/15/2017.   Specialty:  Cardiothoracic Surgery Why:  Appointment is at 8:00 Contact information: [address] [place] [address] [facility].   Why:  rolling walker arranged- to be delivered to room prior to discharge Contact information: [address] [place]  [address] [phone]           Signed: Ellwood Handler 07/04/2017, 2:51 PM

## 2017-07-02 NOTE — Progress Notes (Signed)
CARDIAC REHAB PHASE I   PRE:  Rate/Rhythm: 78 SR with PACs    BP: sitting 142/62    SaO2: 94 RA  MODE:  Ambulation: 470 ft   POST:  Rate/Rhythm: 88 SR with PACs    BP: sitting 131/66     SaO2: 94 RA  Pt moving very well. Stood without assist and used RW in hall, no assist. No c/o. Many PACs. To recliner, thankful for walk. He can ambulate independently if staff will help with recliner. Will f/u tomorrow. [place], ACSM 07/02/2017 12:11 PM

## 2017-07-02 NOTE — Progress Notes (Signed)
Report called to RN on 4E. 

## 2017-07-02 NOTE — Progress Notes (Signed)
Pt arrived to 4e from 2h. Vitals obtained. Telemetry box applied and CCMD notified x2. Pt oriented to room and staff. CHG bath completed. Pt denies needs at this time. Will continue current plan of care.  Grant Fontana BSN, RN

## 2017-07-02 NOTE — Progress Notes (Signed)
Cardiology  He is in good spirits.  Sinus rhythm.  Overall, progressing well.

## 2017-07-03 ENCOUNTER — Telehealth: Payer: Self-pay | Admitting: *Deleted

## 2017-07-03 LAB — GLUCOSE, CAPILLARY: GLUCOSE-CAPILLARY: 103 mg/dL — AB (ref 65–99)

## 2017-07-03 MED ORDER — ASPIRIN 325 MG PO TBEC: 325.0000 mg | DELAYED_RELEASE_TABLET | Freq: Every day | ORAL | 0 refills | Status: DC

## 2017-07-03 MED ORDER — TRAMADOL HCL 50 MG PO TABS: 50.0000 mg | ORAL_TABLET | ORAL | 0 refills | Status: DC | PRN

## 2017-07-03 MED ORDER — METOPROLOL TARTRATE 50 MG PO TABS: 50.0000 mg | ORAL_TABLET | Freq: Two times a day (BID) | ORAL | 3 refills | Status: DC

## 2017-07-03 MED ORDER — TAMSULOSIN HCL 0.4 MG PO CAPS: 0.4000 mg | ORAL_CAPSULE | Freq: Every day | ORAL | 0 refills | Status: DC

## 2017-07-03 MED ORDER — FUROSEMIDE 40 MG PO TABS: 40.0000 mg | ORAL_TABLET | Freq: Every day | ORAL | 1 refills | Status: DC

## 2017-07-03 MED ORDER — POTASSIUM CHLORIDE CRYS ER 20 MEQ PO TBCR: 20.0000 meq | EXTENDED_RELEASE_TABLET | Freq: Every day | ORAL | 1 refills | Status: DC

## 2017-07-03 MED FILL — Potassium Chloride Inj 2 mEq/ML: INTRAVENOUS | Qty: 20 | Status: AC

## 2017-07-03 MED FILL — Dexmedetomidine HCl in NaCl 0.9% IV Soln 400 MCG/100ML: INTRAVENOUS | Qty: 100 | Status: AC

## 2017-07-03 MED FILL — Heparin Sodium (Porcine) Inj 1000 Unit/ML: INTRAMUSCULAR | Qty: 30 | Status: AC

## 2017-07-03 MED FILL — Magnesium Sulfate Inj 50%: INTRAMUSCULAR | Qty: 10 | Status: AC

## 2017-07-03 NOTE — Progress Notes (Signed)
CARDIAC REHAB PHASE I   Ed completed with pt, wife, and daughter. Voiced understanding and requests his referral be sent to [facility]. He would benefit from RW for home and notified CM.  [phone]  [place], ACSM 07/03/2017 11:41 AM

## 2017-07-03 NOTE — Progress Notes (Signed)
Patient walkde this morning for about 463ft. Tolerated well.

## 2017-07-03 NOTE — Progress Notes (Signed)
      [address]       [place] [address]             [phone]      6 Days Post-Op Procedure(s) (LRB): CORONARY ARTERY BYPASS GRAFTING (CABG), time four   using the left internal mammary artery, and right saphenous leg vein harvested endoscopically. (N/A) TRANSESOPHAGEAL ECHOCARDIOGRAM (TEE) (N/A) CYSTOSCOPY FLEXIBLE, Balloon dilatation placement of foley catheter (N/A)   Subjective:  No new complaints.  Feeling good, ready to go home.  Objective: Vital signs in last 24 hours: Temp:  [97.8 F (36.6 C)-98.8 F (37.1 C)] 97.9 F (36.6 C) (11/14 0552) Pulse Rate:  [73-89] 75 (11/14 0552) Cardiac Rhythm: Normal sinus rhythm;Bundle branch block (11/13 2100) Resp:  [14-25] 19 (11/14 0552) BP: (138-175)/(53-83) 160/83 (11/14 0552) SpO2:  [97 %-98 %] 98 % (11/14 0552) Weight:  [246 lb 1.6 oz (111.6 kg)] 246 lb 1.6 oz (111.6 kg) (11/14 0500)  Intake/Output from previous day: 11/13 0701 - 11/14 0700 In: 480 [P.O.:480] Out: 300 [Urine:300]  General appearance: alert, cooperative and no distress Heart: regular rate and rhythm Lungs: clear to auscultation bilaterally Abdomen: soft, non-tender; bowel sounds normal; no masses,  no organomegaly Extremities: edema trace -1+ Wound: clean and dry  Lab Results: Recent Labs    07/01/17 0222 07/02/17 0155  WBC 13.5* 12.8*  HGB 10.3* 10.0*  HCT 31.3* 29.5*  PLT 250 293   BMET:  Recent Labs    07/01/17 0222 07/02/17 0155  NA 137 137  K 3.6 3.5  CL 100* 102  CO2 30 26  GLUCOSE 124* 91  BUN 25* 27*  CREATININE 1.32* 1.23  CALCIUM 8.3* 8.6*    PT/INR: No results for input(s): LABPROT, INR in the last 72 hours. ABG    Component Value Date/Time   PHART 7.396 06/28/2017 0511   HCO3 24.3 06/28/2017 0511   TCO2 24 06/28/2017 1646   ACIDBASEDEF 2.0 06/27/2017 2053   O2SAT 57.2 06/30/2017 0340   CBG (last 3)  Recent Labs    07/02/17 2132 07/02/17 2232 07/03/17 0633  GLUCAP 51* 98 103*     Assessment/Plan: S/P Procedure(s) (LRB): CORONARY ARTERY BYPASS GRAFTING (CABG), time four   using the left internal mammary artery, and right saphenous leg vein harvested endoscopically. (N/A) TRANSESOPHAGEAL ECHOCARDIOGRAM (TEE) (N/A) CYSTOSCOPY FLEXIBLE, Balloon dilatation placement of foley catheter (N/A)  1. CV- NSR, with occasional PVC, hypertensive this morning- continue Lopressor, will increase Diovan to home dose 2. Pulm- no acute issues, continue IS 3. Renal- creatinine has been stable, weight is improving, continue Lasix, potassium 4. DM- sugars controlled, continue home regimen 5. Dispo- patient stable, will d/c home today   LOS: 8 days    Nija Koopman 07/03/2017

## 2017-07-03 NOTE — Care Management Note (Signed)
Case Management Note Original Note Created by Tomi Bamberger  Patient Details  Name: Larry [name] MRN: [id number] Date of Birth: [birth year]  Subjective/Objective:   From home with spouse, POD 1 CABG, conts with chest tubes, diuresing.                    Action/Plan: NCM will follow for dc needs.   Expected Discharge Date:  07/03/17               Expected Discharge Plan:  [place]  In-House Referral:  NA  Discharge planning Services  CM Consult  Post Acute Care Choice:  Durable Medical Equipment Choice offered to:  Patient  DME Arranged:  [place] rolling DME Agency:  [place]:  NA [place] Agency:  NA  Status of Service:  Completed, signed off  If discussed at [place] of Stay Meetings, dates discussed:    Discharge Disposition: home/self care   Additional Comments:  07/03/17- 1120- [name] RN, CM- pt for d/c home today- notified by cardiac rehab that pt needs RW for discharge- order placed- Brad with [facility] notified for DME need- RW to be delivered to room prior to discharge.    [name], RN 07/01/2017, 10:00 AM--CM spoke with pt -  pts wife will provide 24 hour supervision at discharge.  Pt was completely independent from home PTA.  CM will continue to follow for discharge needs  [name], RN 07/03/2017, 11:29 AM [phone]

## 2017-07-03 NOTE — Telephone Encounter (Signed)
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 Pt had S/P CABG x 4 on 06/27/17. Pt has been d/c 07/03/17, and will f/u with  Grace Isaac, MD Follow up on 07/15/2017.   Specialty:  Cardiothoracic Surgery Why:  Appointment is at 8:00 Contact information:

## 2017-07-03 NOTE — Care Management Important Message (Signed)
Important Message  Patient Details  Name: Larry [name] MRN: [id number] Date of Birth: 09-28-1940   Medicare Important Message Given:  Yes    Nathen May 07/03/2017, 10:56 AM

## 2017-07-03 NOTE — Progress Notes (Signed)
   He looks good.  Excited about discharge today.  Looking forward to discharge today.  I look forward to seeing him as an outpatient.

## 2017-07-04 ENCOUNTER — Other Ambulatory Visit: Payer: Self-pay

## 2017-07-04 ENCOUNTER — Other Ambulatory Visit: Payer: Self-pay | Admitting: *Deleted

## 2017-07-04 ENCOUNTER — Telehealth: Payer: Self-pay | Admitting: Interventional Cardiology

## 2017-07-04 ENCOUNTER — Telehealth (HOSPITAL_COMMUNITY): Payer: Self-pay

## 2017-07-04 DIAGNOSIS — I6529 Occlusion and stenosis of unspecified carotid artery: Secondary | ICD-10-CM

## 2017-07-04 DIAGNOSIS — I10 Essential (primary) hypertension: Secondary | ICD-10-CM

## 2017-07-04 MED ORDER — VALSARTAN 320 MG PO TABS: 320.0000 mg | ORAL_TABLET | Freq: Every day | ORAL | 1 refills | Status: DC

## 2017-07-04 NOTE — Telephone Encounter (Signed)
New message    Sharyn Lull at  [facility] [phone] states they called patient to schedule at CT Neck for 12/10, patient wants to know if he is safe for procedure. Please call.      [facility] Pre-operative Risk Assessment    Request for surgical clearance:  What type of surgery is being performed? CT angio neck 1. When is this surgery scheduled? 12/10  2. Are there any medications that need to be held prior to surgery and how long?n/a  3. Practice name and name of physician performing surgery? [facility]  4. What is your office phone and fax number? Phone [phone]  5. Anesthesia type (None, local, MAC, general) ?n/a   Laurier Nancy 07/04/2017, 1:01 PM  _________________________________________________________________   (provider comments below)

## 2017-07-04 NOTE — Telephone Encounter (Signed)
   Chart reviewed as part of pre-operative protocol coverage. Patient is scheduled for CTA neck. Patient should be an acceptable risk for imaging. Continue with planned follow up with [facility].   Christell Faith, PA-C 07/04/2017, 2:03 PM

## 2017-07-04 NOTE — Telephone Encounter (Signed)
Patients insurance is active and benefits verified through Medicare Part A & B - No co-pay, deductible amount of $183.00/$183.00 has been met, no out of pocket, 20% co-insurance, and no pre-authorization is required. Passport/reference [id number]  Patients insurance is active and benefits verified through [facility] - No co-pay, no deductible, no out of pocket, no co-insurance, no pre-authorization is required. Passport/reference [id number]  Patient will be contacted and scheduled after their follow up appt with the Cardiologist office upon review by the RN Navigator.

## 2017-07-04 NOTE — Consult Note (Signed)
           [facility] CM Primary Care Navigator  07/04/2017  KADE DEMICCO 1940/11/06 [id number]   Attempt to seepatient at the bedsideafter transfer from ICU to identify possible discharge needs but he was alreadydischargedper staff report.  He was admitted for CAD in native artery and had S/P CABG x 4.   Patient was discharged home yesterday with home health services.  Primary care provider's officeis listed as providing transition of care (TOC).  Patient has discharge instruction to follow-up with cardiothoracic surgery on 11/26.   For questions, please contact:  Dannielle Huh, BSN, RN- [facility] Primary Care Navigator  Telephone: [phone] [place]

## 2017-07-05 ENCOUNTER — Other Ambulatory Visit: Payer: Self-pay

## 2017-07-05 MED ORDER — LOSARTAN POTASSIUM 50 MG PO TABS: 50.0000 mg | ORAL_TABLET | Freq: Every day | ORAL | 0 refills | Status: DC

## 2017-07-05 NOTE — Telephone Encounter (Signed)
Called [facility] to obtain fax # Faxed clearance note via Epic to [phone]

## 2017-07-08 ENCOUNTER — Telehealth: Payer: Self-pay | Admitting: Surgery

## 2017-07-08 ENCOUNTER — Other Ambulatory Visit: Payer: Self-pay

## 2017-07-08 MED ORDER — DIPHENHYDRAMINE HCL 50 MG PO CAPS: ORAL_CAPSULE | ORAL | 0 refills | Status: DC

## 2017-07-08 MED ORDER — PREDNISONE 50 MG PO TABS: ORAL_TABLET | ORAL | 0 refills | Status: DC

## 2017-07-08 NOTE — Telephone Encounter (Signed)
Sched appts 07/29/17. CTA at 9:00 at [place]. MD at 11:00. Spoke to pt to inform them of appt and 13 hr prep. Mailed cta letter.

## 2017-07-08 NOTE — Telephone Encounter (Signed)
-----   Message from Mena Goes, RN sent at 07/03/2017  2:19 PM EST ----- Regarding: CTA prior to appt with [facility]   ----- Message ----- From: Serafina Mitchell, MD Sent: 07/03/2017   1:43 PM To: [facility] Charge Pool  Please call patient and schedule office appointment with me in 3-4 weeks.  Prior to his visit, he will need a low dose contrast CTA of the neck to evaluate his carotid stenosis.  He also needs to be premedicated for contrast allergy

## 2017-07-15 ENCOUNTER — Ambulatory Visit (INDEPENDENT_AMBULATORY_CARE_PROVIDER_SITE_OTHER): Payer: Medicare Other | Admitting: Nurse Practitioner

## 2017-07-15 ENCOUNTER — Encounter: Payer: Self-pay | Admitting: Nurse Practitioner

## 2017-07-15 ENCOUNTER — Telehealth: Payer: Self-pay | Admitting: Nurse Practitioner

## 2017-07-15 ENCOUNTER — Telehealth: Payer: Self-pay | Admitting: Interventional Cardiology

## 2017-07-15 VITALS — BP 104/58 | HR 81 | Ht 72.0 in | Wt 241.0 lb

## 2017-07-15 DIAGNOSIS — I5021 Acute systolic (congestive) heart failure: Secondary | ICD-10-CM | POA: Diagnosis not present

## 2017-07-15 DIAGNOSIS — I259 Chronic ischemic heart disease, unspecified: Secondary | ICD-10-CM

## 2017-07-15 DIAGNOSIS — I5022 Chronic systolic (congestive) heart failure: Secondary | ICD-10-CM | POA: Diagnosis not present

## 2017-07-15 DIAGNOSIS — E7849 Other hyperlipidemia: Secondary | ICD-10-CM

## 2017-07-15 DIAGNOSIS — Z951 Presence of aortocoronary bypass graft: Secondary | ICD-10-CM | POA: Diagnosis not present

## 2017-07-15 LAB — BASIC METABOLIC PANEL
BUN/Creatinine Ratio: 18 (ref 10–24)
BUN: 23 mg/dL (ref 8–27)
CO2: 25 mmol/L (ref 20–29)
Calcium: 9.9 mg/dL (ref 8.6–10.2)
Chloride: 100 mmol/L (ref 96–106)
Creatinine, Ser: 1.25 mg/dL (ref 0.76–1.27)
GFR calc Af Amer: 65 mL/min/{1.73_m2} (ref >59)
GFR calc non Af Amer: 56 mL/min/{1.73_m2} — ABNORMAL LOW (ref >59)
Glucose: 231 mg/dL — ABNORMAL HIGH (ref 65–99)
Potassium: 4.7 mmol/L (ref 3.5–5.2)
Sodium: 139 mmol/L (ref 134–144)

## 2017-07-15 LAB — CBC
Hematocrit: 33.1 % — ABNORMAL LOW (ref 37.5–51.0)
Hemoglobin: 10.8 g/dL — ABNORMAL LOW (ref 13.0–17.7)
MCH: 29.9 pg (ref 26.6–33.0)
MCHC: 32.6 g/dL (ref 31.5–35.7)
MCV: 92 fL (ref 79–97)
Platelets: 582 10*3/uL — ABNORMAL HIGH (ref 150–379)
RBC: 3.61 x10E6/uL — ABNORMAL LOW (ref 4.14–5.80)
RDW: 12.8 % (ref 12.3–15.4)
WBC: 11.4 10*3/uL — ABNORMAL HIGH (ref 3.4–10.8)

## 2017-07-15 NOTE — Telephone Encounter (Deleted)
error 

## 2017-07-15 NOTE — Telephone Encounter (Signed)
Returned call to pt's wife. She had some questions regarding the surgical site and what kind of stitches were placed. Informed her to call Dr. Stephens Shire office for clarification. She was also confused as to when the patient would have blood work. Per appt scheduled on 12/18, pt should have fasting lipids drawn prior to OV with Truitt Merle, NP and would be contacted when the results are posted for lab work from today. She verbalized understanding and thanked me for the call.

## 2017-07-15 NOTE — Patient Instructions (Addendum)
We will be checking the following labs today - BMET and CBC   Medication Instructions:    Continue with your current medicines.   Ok to get some OTC Colace and take daily as a stool softener.    Testing/Procedures To Be Arranged:  N/A  Follow-Up:   See me in 4 weeks with fasting labs  Will get you to see Dr. Tamala Julian in 3 months  See Dr. Trula Slade as planned    Other Special Instructions:   N/A    If you need a refill on your cardiac medications before your next appointment, please call your pharmacy.   Call the [place] office at [phone] if you have any questions, problems or concerns.

## 2017-07-15 NOTE — Progress Notes (Signed)
CARDIOLOGY OFFICE NOTE  Date:  07/15/2017    Larry [name] Date of Birth: Mar 22, 1941 Medical Record #[id number]  PCP:  Cassandria Anger, MD  Cardiologist:  Jennings Books  Chief Complaint  Patient presents with  . Coronary Artery Disease    Post hospital visit - seen for Dr. Tamala Julian    History of Present Illness: Larry [name] is a 76 y.o. male who presents today for a post hospital visit. Seen for Dr. Tamala Julian.   He has a history of HTN, DM, HLD, and OSA.    Seen here back at the end of October with progressive exertional fatigue and chest tightness.  He was started on ASA and prn NTG. Cardiac cath was recommended.  This was done on 06/24/2017 and revealed multivessel CAD with a reduced EF of 35-40%.  Grade 1 diastolic dysfunction noted as well.   He was taken to the operating room and underwent CABG x 4 per PVT utilizing LIMA to LAD, SVG to Ramus Intermediate, SVG to OM, and SVG to RCA.  He also underwent endoscopic harvest greater saphenous vein from his right leg.  He required cystoscopye with foley placement.  He was aggressively diuresed for hypervolemia.  He was hypertensive and restarted on home ARB.  He maintained NSR. Noted to have high grade carotid stenosis - to be addressed at later date by Dr. Trula Slade.    Comes in today. Here alone. Home about 2 weeks and doing well. Appetite not great. Bowels a little sluggish - wondering if he could use stool softener. Sleeping great. No pain medicines. Walking some - would hold on cardiac rehab for now. Seeing Dr. Trula Slade with CT scan in about 3 weeks. BP 120's at home. Little lightheaded. Swelling in legs has improved. Weight is coming down.  He is not short of breath. No chest pain.   Past Medical History:  Diagnosis Date  . Cataracts, bilateral    removed  . Hyperlipemia   . Prostate CA ([place])   . Sleep apnea    Dr Ledora Bottcher ; uses CPAP  . Type II or unspecified type diabetes mellitus without mention of complication, not  stated as uncontrolled   . Unspecified essential hypertension     Past Surgical History:  Procedure Laterality Date  . APPENDECTOMY    . CATARACT EXTRACTION, BILATERAL    . CORONARY ARTERY BYPASS GRAFT N/A 06/27/2017   Procedure: CORONARY ARTERY BYPASS GRAFTING (CABG), time four   using the left internal mammary artery, and right saphenous leg vein harvested endoscopically.;  Surgeon: Ivin Poot, MD;  Location: [facility];  Service: Open Heart Surgery;  Laterality: N/A;  . CYSTOSCOPY N/A 06/27/2017   Procedure: CYSTOSCOPY FLEXIBLE, Balloon dilatation placement of foley catheter;  Surgeon: Ivin Poot, MD;  Location: [facility];  Service: Open Heart Surgery;  Laterality: N/A;  . INTRAVASCULAR ULTRASOUND/IVUS N/A 06/24/2017   Procedure: Intravascular Ultrasound/IVUS;  Surgeon: Belva Crome, MD;  Location: [facility];  Service: Cardiovascular;  Laterality: N/A;  LEFT MAIN  . LEFT HEART CATH AND CORONARY ANGIOGRAPHY N/A 06/24/2017   Procedure: LEFT HEART CATH AND CORONARY ANGIOGRAPHY;  Surgeon: Belva Crome, MD;  Location: [facility];  Service: Cardiovascular;  Laterality: N/A;  . PROSTATECTOMY    . TEE WITHOUT CARDIOVERSION N/A 06/27/2017   Procedure: TRANSESOPHAGEAL ECHOCARDIOGRAM (TEE);  Surgeon: Prescott Gum, Collier Salina, MD;  Location: [facility];  Service: Open Heart Surgery;  Laterality: N/A;  . ULTRASOUND GUIDANCE  FOR VASCULAR ACCESS  06/24/2017   Procedure: Ultrasound Guidance For Vascular Access;  Surgeon: Belva Crome, MD;  Location: [facility];  Service: Cardiovascular;;     Medications: Current Meds  Medication Sig  . ACCU-CHEK FASTCLIX LANCETS MISC 1 each by Does not apply route 2 (two) times daily. Dx:E11.9  . Ascorbic Acid (VITAMIN C) 1000 MG tablet Take 1,000 mg by mouth daily.  [name] aspirin EC 325 MG EC tablet Take 1 tablet (325 mg total) daily by mouth.  . Cholecalciferol (VITAMIN D3) 1000 UNITS CAPS Take 1 capsule by mouth daily.   . diphenhydrAMINE (BENADRYL)  50 MG capsule Take 50 mg by mouth 1 hour prior to your procedure.  . furosemide (LASIX) 40 MG tablet Take 1 tablet (40 mg total) daily by mouth.  [name] glimepiride (AMARYL) 2 MG tablet Take 1 tablet (2 mg total) by mouth 2 (two) times daily. (Patient taking differently: Take 2 mg by mouth daily with breakfast. )  . glucose blood (ACCU-CHEK SMARTVIEW) test strip 1 each by Other route 2 (two) times daily. Use to check blood sugars twice a day Dx E11.9  . glucose blood test strip 1 each by Other route 2 (two) times daily. Please dispense brand of choice per pt and insurance preference. Dx:250.00  . glucose monitoring kit (FREESTYLE) monitoring kit 1 each by Does not apply route as needed for other. Please dispense brand of choice per ins/patient. Dx: 250.00.  [name] Krill Oil 300 MG CAPS Take 300 mg by mouth daily.  [name] losartan (COZAAR) 50 MG tablet Take 1 tablet (50 mg total) at bedtime by mouth.  . metFORMIN (GLUCOPHAGE) 1000 MG tablet Take 1 tablet (1,000 mg total) by mouth 2 (two) times daily with a meal.  . metoprolol tartrate (LOPRESSOR) 50 MG tablet Take 1 tablet (50 mg total) 2 (two) times daily by mouth.  . Multiple Vitamins-Minerals (MH MACULAR HEALTH PO) Take 1 tablet by mouth 2 (two) times daily.  . nitroGLYCERIN (NITROSTAT) 0.4 MG SL tablet Place 1 tablet (0.4 mg total) under the tongue every 5 (five) minutes as needed for chest pain.  . potassium chloride SA (K-DUR,KLOR-CON) 20 MEQ tablet Take 1 tablet (20 mEq total) daily by mouth.  . predniSONE (DELTASONE) 50 MG tablet One tablet (35m) 13 hours prior to procedure; one tablet (577m 7 hours prior to procedure and then one tablet (50 mg) one hour prior to procedure.  . simvastatin (ZOCOR) 40 MG tablet Take 1 tablet (40 mg total) by mouth at bedtime.  . tamsulosin (FLOMAX) 0.4 MG CAPS capsule Take 1 capsule (0.4 mg total) daily by mouth.  . traMADol (ULTRAM) 50 MG tablet Take 1-2 tablets (50-100 mg total) every 4 (four) hours as needed by mouth for  moderate pain.     Allergies: Allergies  Allergen Reactions  . Amlodipine     Leg swelling  . Clindamycin Hcl   . Verapamil     Edema w/high dose    Social History: The patient  reports that  has never smoked. he has never used smokeless tobacco. He reports that he does not drink alcohol or use drugs.   Family History: The patient's family history includes Diabetes in his unknown relative; Heart disease in his mother; Hypertension in his unknown relative.   Review of Systems: Please see the history of present illness.   Otherwise, the review of systems is positive for none.   All other systems are reviewed and negative.   Physical Exam:  VS:  BP (!) 104/58   Pulse 81   Ht 6' (1.829 m)   Wt 241 lb (109.3 kg)   SpO2 96%   BMI 32.69 kg/m  .  BMI Body mass index is 32.69 kg/m.  Wt Readings from Last 3 Encounters:  07/15/17 241 lb (109.3 kg)  07/03/17 246 lb 1.6 oz (111.6 kg)  06/19/17 254 lb 3.2 oz (115.3 kg)    General: Pleasant. Elderly male. Well developed, well nourished and in no acute distress.  Color is pale.  HEENT: Normal.  Neck: Supple, no JVD, carotid bruits, or masses noted.  Cardiac: Regular rate and rhythm. Heart tones are distant. Sternum looks good. No murmurs, rubs, or gallops. +1 edema bilaterally.  Respiratory:  Lungs are fairly clear to auscultation bilaterally with normal work of breathing.  GI: Soft and nontender.  MS: No deformity or atrophy. Gait and ROM intact.  Skin: Warm and dry. Color is normal.  Neuro:  Strength and sensation are intact and no gross focal deficits noted.  Psych: Alert, appropriate and with normal affect.   LABORATORY DATA:  EKG:  EKG is ordered today. This demonstrates NSR with RBBB and PVCs.  Lab Results  Component Value Date   WBC 12.8 (H) 07/02/2017   HGB 10.0 (L) 07/02/2017   HCT 29.5 (L) 07/02/2017   PLT 293 07/02/2017   GLUCOSE 91 07/02/2017   CHOL 128 06/18/2017   TRIG 105.0 06/18/2017   HDL 40.40  06/18/2017   LDLCALC 66 06/18/2017   ALT 17 06/18/2017   AST 15 06/18/2017   NA 137 07/02/2017   K 3.5 07/02/2017   CL 102 07/02/2017   CREATININE 1.23 07/02/2017   BUN 27 (H) 07/02/2017   CO2 26 07/02/2017   TSH 0.879 06/25/2017   PSA 0.00 Repeated and verified X2. (L) 12/26/2012   INR 1.32 06/27/2017   HGBA1C 7.1 (H) 06/25/2017     BNP (last 3 results) No results for input(s): BNP in the last 8760 hours.  ProBNP (last 3 results) Recent Labs    06/18/17 0847  PROBNP 323.0*     Other Studies Reviewed Today:  Significant Diagnostic Studies: Angiography 06/2017:    Significant ostial left main in the 50-70% range. Attempted intravascular ultrasound but the probe would not pass beyond the ostium of the left main. Selective engagement produced ventricularization and pressure damping with reproduction of symptoms that the patient has clinically.  75-80% calcified mid LAD stenosis and 90+ percent stenosis in the second diagonal.  Moderate first and second obtuse marginal stenoses.  Dominant right coronary with eccentric 40-50% mid vessel narrowing.  Left ventricular global hypokinesis with estimated EF 35-40%. Elevated end-diastolic pressure consistent with chronic combined systolic and diastolic heart failure. LV dysfunction may be out of proportion for the coronary disease identified.  Treatments: surgery:   1. Coronary artery bypass grafting x4 (left internal mammary artery to LAD, saphenous vein graft to OM, saphenous vein graft to ramus intermediate, saphenous vein graft to RCA). 2. Endoscopic harvest of right leg greater saphenous vein.   Echo Study Conclusions 06/2017  - Left ventricle: The cavity size was mildly dilated. There was   mild concentric hypertrophy. Systolic function was mildly to   moderately reduced. The estimated ejection fraction was in the   range of 40% to 45%. Hypokinesis of the anteroseptal, anterior,   and anterolateral myocardium.  Doppler parameters are consistent   with abnormal left ventricular relaxation (grade 1 diastolic   dysfunction). Doppler parameters are consistent  with high   ventricular filling pressure. - Aortic valve: Transvalvular velocity was within the normal range.   There was no stenosis. There was no regurgitation. - Mitral valve: Transvalvular velocity was within the normal range.   There was no evidence for stenosis. There was no regurgitation. - Right ventricle: The cavity size was normal. Wall thickness was   normal. Systolic function was normal. - Tricuspid valve: There was mild regurgitation. - Pulmonary arteries: Systolic pressure was within the normal   range. PA peak pressure: 20 mm Hg (S).  Pre-CABG doppler study Final Interpretation 06/2017: Right Carotid: There is evidence in the right ICA of a 40-59% stenosis.  Left Carotid: There is evidence in the left ICA of a 80-99% stenosis. Vertebrals: Both vertebral arteries were patent with antegrade flow. Subclavians: Normal flow hemodynamics were seen in bilateral subclavian       arteries.  Right ABI: Resting right ankle-brachial index indicates mild right lower extremity arterial disease. The right toe-brachial index is normal. Left ABI: Resting left ankle-brachial index is within normal range. No evidence of significant left lower extremity arterial disease. The left toe-brachial index is normal.  Palmar arch evaluation - Doppler waveforms remained normal bilaterally with both radial and ulnar compressions.  Electronically signed by Harold Barban on 06/26/2017 at 5:10:05 PM.  Assessment/Plan:  1. Severe CAD - s/p CABG x 4 - doing well. Would hold on sending to cardiac rehab until he gets his carotid taken care of. Lab today. Reminded about lifting restrictions. Ok to use Colace prn.  Overall, he is doing well.   2. LV dysfunction/chronic systolic & diastolic HF - on ARB and beta blocker along with diuretic. BP soft - would  continue on his current regimen. Lab today. Plan for repeat echo in about 3 months.   3. HTN - BP is good here - recheck by me is 104/80 - no changes made today.   4. High grade carotid disease - seeing Dr. Trula Slade in just a couple of weeks.   5. HLD - on statin - lab on return. He is on Zocor - may need to consider Crestor/Lipitor  6. DM - managed by PCP  7. CKD - lab today  Current medicines are reviewed with the patient today.  The patient does not have concerns regarding medicines other than what has been noted above.  The following changes have been made:  See above.  Labs/ tests ordered today include:    Orders Placed This Encounter  Procedures  . Basic metabolic panel  . CBC  . EKG 12-Lead     Disposition:   FU with me in about 4 weeks with fasting labs. Will get him to see Dr. Tamala Julian in about 3 months.    Patient is agreeable to this plan and will call if any problems develop in the interim.   SignedTruitt Merle, NP  07/15/2017 8:36 AM  [facility] [address] [place], [place]  [address] Phone: [phone] Fax: [phone]

## 2017-07-15 NOTE — Telephone Encounter (Signed)
New Message  Pt wife call requesting to speak with RN about pts stitches in leg and also some lab work. Please call back to discuss

## 2017-07-16 ENCOUNTER — Encounter (INDEPENDENT_AMBULATORY_CARE_PROVIDER_SITE_OTHER): Payer: Self-pay

## 2017-07-16 DIAGNOSIS — Z4802 Encounter for removal of sutures: Secondary | ICD-10-CM

## 2017-07-29 ENCOUNTER — Ambulatory Visit: Payer: Medicare Other | Admitting: Surgery

## 2017-07-29 ENCOUNTER — Other Ambulatory Visit: Payer: Self-pay | Admitting: Cardiothoracic Surgery

## 2017-07-29 ENCOUNTER — Other Ambulatory Visit: Payer: Medicare Other

## 2017-07-29 DIAGNOSIS — Z951 Presence of aortocoronary bypass graft: Secondary | ICD-10-CM

## 2017-07-31 ENCOUNTER — Other Ambulatory Visit: Payer: Self-pay | Admitting: Internal Medicine

## 2017-07-31 ENCOUNTER — Ambulatory Visit (INDEPENDENT_AMBULATORY_CARE_PROVIDER_SITE_OTHER): Payer: Self-pay | Admitting: Cardiothoracic Surgery

## 2017-07-31 ENCOUNTER — Ambulatory Visit
Admission: RE | Admit: 2017-07-31 | Discharge: 2017-07-31 | Disposition: A | Discharge status: Home / Self Care | Payer: Medicare Other | Source: Ambulatory Visit | Attending: Cardiothoracic Surgery | Admitting: Cardiothoracic Surgery

## 2017-07-31 ENCOUNTER — Other Ambulatory Visit: Payer: Self-pay

## 2017-07-31 ENCOUNTER — Encounter: Payer: Self-pay | Admitting: Cardiothoracic Surgery

## 2017-07-31 VITALS — BP 109/61 | HR 70 | Ht 72.0 in | Wt 240.0 lb

## 2017-07-31 DIAGNOSIS — I25810 Atherosclerosis of coronary artery bypass graft(s) without angina pectoris: Secondary | ICD-10-CM | POA: Diagnosis not present

## 2017-07-31 DIAGNOSIS — Z951 Presence of aortocoronary bypass graft: Secondary | ICD-10-CM

## 2017-07-31 MED ORDER — LOSARTAN POTASSIUM 50 MG PO TABS: 50.0000 mg | ORAL_TABLET | Freq: Every day | ORAL | 2 refills | Status: DC

## 2017-07-31 NOTE — Progress Notes (Signed)
PCP is Plotnikov, [name], MD Referring Provider is Belva Crome, MD  Chief Complaint  Patient presents with  . Follow-up    CABG x 4, 06/27/2017    HPI: Patient returns for scheduled follow-up after CABG 4 after presenting with unstable angina and severe three-vessel CAD. Preoperative evaluation demonstrated a 80-99 percent left carotid stenosis. This was asymptomatic The patient was evaluated by Dr. Trula Slade. Recommendation was to proceed with surgical coronary graduation and evaluate later with carotid CTA for possible carotid endarterectomy.  Since returning home the patient is done well without recurrent angina. Surgical incisions are healing well. He has had no symptoms of carotid disease. Chest x-ray taken today is clear. We discussed stopping some of his medications including Flomax, Lasix, potassium. He'll continue his losartan, metoprolol, aspirin, diabetic medication.  Past Medical History:  Diagnosis Date  . Cataracts, bilateral    removed  . Hyperlipemia   . Prostate CA ([place])   . Sleep apnea    Dr Ledora Bottcher ; uses CPAP  . Type II or unspecified type diabetes mellitus without mention of complication, not stated as uncontrolled   . Unspecified essential hypertension     Past Surgical History:  Procedure Laterality Date  . APPENDECTOMY    . CATARACT EXTRACTION, BILATERAL    . CORONARY ARTERY BYPASS GRAFT N/A 06/27/2017   Procedure: CORONARY ARTERY BYPASS GRAFTING (CABG), time four   using the left internal mammary artery, and right saphenous leg vein harvested endoscopically.;  Surgeon: Ivin Poot, MD;  Location: [facility];  Service: Open Heart Surgery;  Laterality: N/A;  . CYSTOSCOPY N/A 06/27/2017   Procedure: CYSTOSCOPY FLEXIBLE, Balloon dilatation placement of foley catheter;  Surgeon: Ivin Poot, MD;  Location: [facility];  Service: Open Heart Surgery;  Laterality: N/A;  . INTRAVASCULAR ULTRASOUND/IVUS N/A 06/24/2017   Procedure: Intravascular Ultrasound/IVUS;   Surgeon: Belva Crome, MD;  Location: [facility];  Service: Cardiovascular;  Laterality: N/A;  LEFT MAIN  . LEFT HEART CATH AND CORONARY ANGIOGRAPHY N/A 06/24/2017   Procedure: LEFT HEART CATH AND CORONARY ANGIOGRAPHY;  Surgeon: Belva Crome, MD;  Location: [facility];  Service: Cardiovascular;  Laterality: N/A;  . PROSTATECTOMY    . TEE WITHOUT CARDIOVERSION N/A 06/27/2017   Procedure: TRANSESOPHAGEAL ECHOCARDIOGRAM (TEE);  Surgeon: Prescott Gum, Collier Salina, MD;  Location: [facility];  Service: Open Heart Surgery;  Laterality: N/A;  . ULTRASOUND GUIDANCE FOR VASCULAR ACCESS  06/24/2017   Procedure: Ultrasound Guidance For Vascular Access;  Surgeon: Belva Crome, MD;  Location: [facility];  Service: Cardiovascular;;    Family History  Problem Relation Age of Onset  . Heart disease Mother   . Diabetes Unknown   . Hypertension Unknown   . Colon cancer Neg Hx     Social History Social History   Tobacco Use  . Smoking status: Never Smoker  . Smokeless tobacco: Never Used  Substance Use Topics  . Alcohol use: No    Alcohol/week: 0.0 oz  . Drug use: No    Current Outpatient Medications  Medication Sig Dispense Refill  . ACCU-CHEK FASTCLIX LANCETS MISC 1 each by Does not apply route 2 (two) times daily. Dx:E11.9 100 each 5  . Ascorbic Acid (VITAMIN C) 1000 MG tablet Take 1,000 mg by mouth daily.    [name] aspirin EC 325 MG EC tablet Take 1 tablet (325 mg total) daily by mouth. 30 tablet 0  . Cholecalciferol (VITAMIN D3) 1000 UNITS CAPS Take 1  capsule by mouth daily.     . diphenhydrAMINE (BENADRYL) 50 MG capsule Take 50 mg by mouth 1 hour prior to your procedure. 1 capsule 0  . furosemide (LASIX) 40 MG tablet Take 1 tablet (40 mg total) daily by mouth. 30 tablet 1  . glimepiride (AMARYL) 2 MG tablet Take 1 tablet (2 mg total) by mouth 2 (two) times daily. (Patient taking differently: Take 2 mg by mouth daily with breakfast. ) 180 tablet 3  . glucose blood (ACCU-CHEK  SMARTVIEW) test strip 1 each by Other route 2 (two) times daily. Use to check blood sugars twice a day Dx E11.9 100 each 3  . glucose blood test strip 1 each by Other route 2 (two) times daily. Please dispense brand of choice per pt and insurance preference. Dx:250.00 100 each 5  . glucose monitoring kit (FREESTYLE) monitoring kit 1 each by Does not apply route as needed for other. Please dispense brand of choice per ins/patient. Dx: 250.00. 1 each 0  . Krill Oil 300 MG CAPS Take 300 mg by mouth daily.    [name] losartan (COZAAR) 50 MG tablet Take 1 tablet (50 mg total) by mouth at bedtime. 30 tablet 2  . metFORMIN (GLUCOPHAGE) 1000 MG tablet Take 1 tablet (1,000 mg total) by mouth 2 (two) times daily with a meal. 180 tablet 3  . metoprolol tartrate (LOPRESSOR) 50 MG tablet Take 1 tablet (50 mg total) 2 (two) times daily by mouth. 60 tablet 3  . Multiple Vitamins-Minerals (MH MACULAR HEALTH PO) Take 1 tablet by mouth 2 (two) times daily.    . nitroGLYCERIN (NITROSTAT) 0.4 MG SL tablet Place 1 tablet (0.4 mg total) under the tongue every 5 (five) minutes as needed for chest pain. 20 tablet 3  . potassium chloride SA (K-DUR,KLOR-CON) 20 MEQ tablet Take 1 tablet (20 mEq total) daily by mouth. 30 tablet 1  . predniSONE (DELTASONE) 50 MG tablet One tablet ('50mg'$ ) 13 hours prior to procedure; one tablet ('50mg'$ ) 7 hours prior to procedure and then one tablet (50 mg) one hour prior to procedure. 3 tablet 0  . simvastatin (ZOCOR) 40 MG tablet Take 1 tablet (40 mg total) by mouth at bedtime. 90 tablet 3  . tamsulosin (FLOMAX) 0.4 MG CAPS capsule Take 1 capsule (0.4 mg total) daily by mouth. 30 capsule 0  . traMADol (ULTRAM) 50 MG tablet Take 1-2 tablets (50-100 mg total) every 4 (four) hours as needed by mouth for moderate pain. 30 tablet 0   No current facility-administered medications for this visit.     Allergies  Allergen Reactions  . Amlodipine     Leg swelling  . Clindamycin Hcl   . Verapamil     Edema  w/high dose    Review of Systems  Weight loss 10-12 pounds but appetite is starting to improve No dizziness or falls Minimal ankle edema No drainage from incisions  BP 109/61   Pulse 70   Ht 6' (1.829 m)   Wt 240 lb (108.9 kg)   SpO2 98%   BMI 32.55 kg/m  Physical Exam      Exam    General- alert and comfortable   Lungs- clear without rales, wheezes   Cor- regular rate and rhythm, no murmur , gallop   Abdomen- soft, non-tender   Extremities - warm, non-tender, minimal edema   Neuro- oriented, appropriate, no focal weakness   Diagnostic Tests: Chest x-ray clear sternal wires intact  Impression: Excellent early recovery after multivessel CABG  Patient is scheduled for carotid CTA that evaluation by Dr. Trula Slade later this month. The patient could safely proceed with carotid endarterectomy 8 weeks after his CABG  Plan: I will see the patient back in 6 weeks for review of progress. He can now  drive and lift up to 20 pounds. He will be ready to start cardiac rehabilitation after the holidays however he may want to wait until after the potential carotid surgery before he actually starts.  Len Childs, MD [facility] [phone]

## 2017-08-01 ENCOUNTER — Ambulatory Visit
Admission: RE | Admit: 2017-08-01 | Discharge: 2017-08-01 | Disposition: A | Discharge status: Home / Self Care | Payer: Medicare Other | Source: Ambulatory Visit | Attending: Surgery | Admitting: Surgery

## 2017-08-01 DIAGNOSIS — I6523 Occlusion and stenosis of bilateral carotid arteries: Secondary | ICD-10-CM | POA: Diagnosis not present

## 2017-08-01 DIAGNOSIS — I6529 Occlusion and stenosis of unspecified carotid artery: Secondary | ICD-10-CM

## 2017-08-01 MED ORDER — IOPAMIDOL (ISOVUE-370) INJECTION 76%
75.0000 mL | Freq: Once | INTRAVENOUS | Status: AC | PRN
  Administered 2017-08-01: 75 mL via INTRAVENOUS

## 2017-08-06 ENCOUNTER — Encounter: Payer: Self-pay | Admitting: Nurse Practitioner

## 2017-08-06 ENCOUNTER — Other Ambulatory Visit: Payer: Self-pay | Admitting: *Deleted

## 2017-08-06 ENCOUNTER — Ambulatory Visit (INDEPENDENT_AMBULATORY_CARE_PROVIDER_SITE_OTHER): Payer: Medicare Other | Admitting: Nurse Practitioner

## 2017-08-06 VITALS — BP 142/84 | HR 69 | Ht 72.0 in | Wt 245.1 lb

## 2017-08-06 DIAGNOSIS — I5022 Chronic systolic (congestive) heart failure: Secondary | ICD-10-CM

## 2017-08-06 DIAGNOSIS — Z951 Presence of aortocoronary bypass graft: Secondary | ICD-10-CM

## 2017-08-06 DIAGNOSIS — E7849 Other hyperlipidemia: Secondary | ICD-10-CM

## 2017-08-06 DIAGNOSIS — I259 Chronic ischemic heart disease, unspecified: Secondary | ICD-10-CM

## 2017-08-06 MED ORDER — LOSARTAN POTASSIUM 100 MG PO TABS: 100.0000 mg | ORAL_TABLET | Freq: Every day | ORAL | 3 refills | Status: DC

## 2017-08-06 NOTE — Progress Notes (Signed)
CARDIOLOGY OFFICE NOTE  Date:  08/06/2017    [name] Date of Birth: 11-Nov-1940 Medical Record #[id number]  PCP:  Cassandria Anger, MD  Cardiologist:  Jennings Books    Chief Complaint  Patient presents with  . Coronary Artery Disease    Follow up visit - seen for Dr. Tamala Julian    History of Present Illness: Larry [name] is a 76 y.o. male who presents today for a follow up visit. Seen for Dr. Tamala Julian.   He has a history of HTN, DM, HLD, and OSA.   Seen here back at the end of October with progressive exertional fatigue and chest tightness. He was started on ASA and prn NTG. Cardiac cath was recommended.  This was done on 06/24/2017 and revealed multivessel CAD with a reduced EF of 35-40%. Grade 1 diastolic dysfunction noted as well.   He was taken to the operating room and underwent CABG x 4 per [facility]T utilizing LIMA to LAD, SVG to Ramus Intermediate, SVG to OM, and SVG to RCA. He also underwent endoscopic harvest greater saphenous vein from his right leg. He required cystoscopy with foley placement. He was aggressively diuresed for hypervolemia. He was hypertensive and restarted on home ARB. He maintained NSR. Noted to have high grade carotid stenosis - to be addressed at later date by Dr. Trula Slade.   I then saw him back for his post op visit - he was basically doing ok.   Comes in today. Here alone. Seeing Dr. Trula Slade next week after Christmas. Continues to do well. He is back working in [employer] 1/2 days. Some light walking. No chest pain. Breathing is good. Vein harvesting site slow to heal - but not red or hot and no drainage. He feels like he is doing well. For repeat lab today. BP running in the 140's now at home.   Past Medical History:  Diagnosis Date  . Cataracts, bilateral    removed  . Hyperlipemia   . Prostate CA ([place])   . Sleep apnea    Dr Ledora Bottcher ; uses CPAP  . Type II or unspecified type diabetes mellitus without mention of complication,  not stated as uncontrolled   . Unspecified essential hypertension     Past Surgical History:  Procedure Laterality Date  . APPENDECTOMY    . CATARACT EXTRACTION, BILATERAL    . CORONARY ARTERY BYPASS GRAFT N/A 06/27/2017   Procedure: CORONARY ARTERY BYPASS GRAFTING (CABG), time four   using the left internal mammary artery, and right saphenous leg vein harvested endoscopically.;  Surgeon: Ivin Poot, MD;  Location: [facility];  Service: Open Heart Surgery;  Laterality: N/A;  . CYSTOSCOPY N/A 06/27/2017   Procedure: CYSTOSCOPY FLEXIBLE, Balloon dilatation placement of foley catheter;  Surgeon: Ivin Poot, MD;  Location: [facility];  Service: Open Heart Surgery;  Laterality: N/A;  . INTRAVASCULAR ULTRASOUND/IVUS N/A 06/24/2017   Procedure: Intravascular Ultrasound/IVUS;  Surgeon: Belva Crome, MD;  Location: [facility];  Service: Cardiovascular;  Laterality: N/A;  LEFT MAIN  . LEFT HEART CATH AND CORONARY ANGIOGRAPHY N/A 06/24/2017   Procedure: LEFT HEART CATH AND CORONARY ANGIOGRAPHY;  Surgeon: Belva Crome, MD;  Location: [facility];  Service: Cardiovascular;  Laterality: N/A;  . PROSTATECTOMY    . TEE WITHOUT CARDIOVERSION N/A 06/27/2017   Procedure: TRANSESOPHAGEAL ECHOCARDIOGRAM (TEE);  Surgeon: Prescott Gum, Collier Salina, MD;  Location: [facility];  Service: Open Heart Surgery;  Laterality: N/A;  . ULTRASOUND  GUIDANCE FOR VASCULAR ACCESS  06/24/2017   Procedure: Ultrasound Guidance For Vascular Access;  Surgeon: Belva Crome, MD;  Location: [facility];  Service: Cardiovascular;;     Medications: Current Meds  Medication Sig  . ACCU-CHEK FASTCLIX LANCETS MISC 1 each by Does not apply route 2 (two) times daily. Dx:E11.9  . Ascorbic Acid (VITAMIN C) 1000 MG tablet Take 1,000 mg by mouth daily.  [name] aspirin EC 325 MG EC tablet Take 1 tablet (325 mg total) daily by mouth.  . Cholecalciferol (VITAMIN D3) 1000 UNITS CAPS Take 1 capsule by mouth daily.   . diphenhydrAMINE  (BENADRYL) 50 MG capsule Take 50 mg by mouth 1 hour prior to your procedure.  [name] glimepiride (AMARYL) 2 MG tablet Take 1 tablet (2 mg total) by mouth 2 (two) times daily. (Patient taking differently: Take 2 mg by mouth daily with breakfast. )  . glucose blood (ACCU-CHEK SMARTVIEW) test strip 1 each by Other route 2 (two) times daily. Use to check blood sugars twice a day Dx E11.9  . glucose blood test strip 1 each by Other route 2 (two) times daily. Please dispense brand of choice per pt and insurance preference. Dx:250.00  . glucose monitoring kit (FREESTYLE) monitoring kit 1 each by Does not apply route as needed for other. Please dispense brand of choice per ins/patient. Dx: 250.00.  [name] Krill Oil 300 MG CAPS Take 300 mg by mouth daily.  [name] losartan (COZAAR) 50 MG tablet Take 1 tablet (50 mg total) by mouth at bedtime.  . metFORMIN (GLUCOPHAGE) 1000 MG tablet Take 1 tablet (1,000 mg total) by mouth 2 (two) times daily with a meal.  . metoprolol tartrate (LOPRESSOR) 50 MG tablet Take 1 tablet (50 mg total) 2 (two) times daily by mouth.  . Multiple Vitamins-Minerals (MH MACULAR HEALTH PO) Take 1 tablet by mouth 2 (two) times daily.  . nitroGLYCERIN (NITROSTAT) 0.4 MG SL tablet Place 1 tablet (0.4 mg total) under the tongue every 5 (five) minutes as needed for chest pain.  . predniSONE (DELTASONE) 50 MG tablet One tablet (56m) 13 hours prior to procedure; one tablet (518m 7 hours prior to procedure and then one tablet (50 mg) one hour prior to procedure.  . simvastatin (ZOCOR) 40 MG tablet Take 1 tablet (40 mg total) by mouth at bedtime.     Allergies: Allergies  Allergen Reactions  . Amlodipine     Leg swelling  . Clindamycin Hcl   . Verapamil     Edema w/high dose    Social History: The patient  reports that  has never smoked. he has never used smokeless tobacco. He reports that he does not drink alcohol or use drugs.   Family History: The patient's family history includes Diabetes in  his unknown relative; Heart disease in his mother; Hypertension in his unknown relative.   Review of Systems: Please see the history of present illness.   Otherwise, the review of systems is positive for none.   All other systems are reviewed and negative.   Physical Exam: VS:  BP (!) 142/84   Pulse 69   Ht 6' (1.829 m)   Wt 245 lb 1.9 oz (111.2 kg)   BMI 33.24 kg/m  .  BMI Body mass index is 33.24 kg/m.  Wt Readings from Last 3 Encounters:  08/06/17 245 lb 1.9 oz (111.2 kg)  07/31/17 240 lb (108.9 kg)  07/15/17 241 lb (109.3 kg)    General: Pleasant. Obese. Alert and  in no acute distress.   HEENT: Normal.  Neck: Supple, no JVD, carotid bruits, or masses noted.  Cardiac: Regular rate and rhythm. Frequent ectopic noted. No edema. Delayed healing of the vein harvesting site noted on the right leg/lower thigh.  Respiratory:  Lungs are clear to auscultation bilaterally with normal work of breathing.  GI: Soft and nontender.  MS: No deformity or atrophy. Gait and ROM intact.  Skin: Warm and dry. Color is normal.  Neuro:  Strength and sensation are intact and no gross focal deficits noted.  Psych: Alert, appropriate and with normal affect.   LABORATORY DATA:  EKG:  EKG is ordered today. This demonstrates NSR with PVC  Lab Results  Component Value Date   WBC 11.4 (H) 07/15/2017   HGB 10.8 (L) 07/15/2017   HCT 33.1 (L) 07/15/2017   PLT 582 (H) 07/15/2017   GLUCOSE 231 (H) 07/15/2017   CHOL 128 06/18/2017   TRIG 105.0 06/18/2017   HDL 40.40 06/18/2017   LDLCALC 66 06/18/2017   ALT 17 06/18/2017   AST 15 06/18/2017   NA 139 07/15/2017   K 4.7 07/15/2017   CL 100 07/15/2017   CREATININE 1.25 07/15/2017   BUN 23 07/15/2017   CO2 25 07/15/2017   TSH 0.879 06/25/2017   PSA 0.00 Repeated and verified X2. (L) 12/26/2012   INR 1.32 06/27/2017   HGBA1C 7.1 (H) 06/25/2017     BNP (last 3 results) No results for input(s): BNP in the last 8760 hours.  ProBNP (last 3  results) Recent Labs    06/18/17 0847  PROBNP 323.0*     Other Studies Reviewed Today:  Significant Diagnostic Studies:Angiography 06/2017:   Significant ostial left main in the 50-70% range. Attempted intravascular ultrasound but the probe would not pass beyond the ostium of the left main. Selective engagement produced ventricularization and pressure damping with reproduction of symptoms that the patient has clinically.  75-80% calcified mid LAD stenosis and 90+ percent stenosis in the second diagonal.  Moderate first and second obtuse marginal stenoses.  Dominant right coronary with eccentric 40-50% mid vessel narrowing.  Left ventricular global hypokinesis with estimated EF 35-40%. Elevated end-diastolic pressure consistent with chronic combined systolic and diastolic heart failure. LV dysfunction may be out of proportion for the coronary disease identified.  Treatments:surgery:  1. Coronary artery bypass grafting x4 (left internal mammary artery to LAD, saphenous vein graft to OM, saphenous vein graft to ramusintermediate, saphenous vein graft to RCA). 2. Endoscopic harvest of right leg greater saphenous vein.   Echo Study Conclusions 06/2017  - Left ventricle: The cavity size was mildly dilated. There was mild concentric hypertrophy. Systolic function was mildly to moderately reduced. The estimated ejection fraction was in the range of 40% to 45%. Hypokinesis of the anteroseptal, anterior, and anterolateral myocardium. Doppler parameters are consistent with abnormal left ventricular relaxation (grade 1 diastolic dysfunction). Doppler parameters are consistent with high ventricular filling pressure. - Aortic valve: Transvalvular velocity was within the normal range. There was no stenosis. There was no regurgitation. - Mitral valve: Transvalvular velocity was within the normal range. There was no evidence for stenosis. There was no  regurgitation. - Right ventricle: The cavity size was normal. Wall thickness was normal. Systolic function was normal. - Tricuspid valve: There was mild regurgitation. - Pulmonary arteries: Systolic pressure was within the normal range. PA peak pressure: 20 mm Hg (S).  Pre-CABG doppler study Final Interpretation 06/2017: Right Carotid: There is evidence in the right ICA of a  40-59% stenosis.  Left Carotid: There is evidence in the left ICA of a 80-99% stenosis. Vertebrals: Both vertebral arteries were patent with antegrade flow. Subclavians: Normal flow hemodynamics were seen in bilateral subclavian       arteries.  Right ABI: Resting right ankle-brachial index indicates mild right lower extremity arterial disease. The right toe-brachial index is normal. Left ABI: Resting left ankle-brachial index is within normal range. No evidence of significant left lower extremity arterial disease. The left toe-brachial index is normal.  Palmar arch evaluation - Doppler waveforms remained normal bilaterally with both radial and ulnar compressions.  Electronically signed by Harold Barban on 06/26/2017 at 5:10:05 PM.  Assessment/Plan:  1. Severe CAD - s/p CABG x 4 - doing well. About 6 weeks out now. He is doing well. EKG today. Would still hold on sending to cardiac rehab until he gets his carotid taken care of.   2. LV dysfunction/chronic systolic & diastolic HF - on ARB and beta blocker along with diuretic. BP now higher - will increase his ARB. Lab today.   3. HTN - BP not at goal - increasing ARB today.   4. High grade carotid disease - seeing Dr. Trula Slade next week  5. HLD - on statin - lab today - may need higher intensity with Lipitor or Crestor.   6. DM - managed by PCP  7. CKD - lab today  Current medicines are reviewed with the patient today.  The patient does not have concerns regarding medicines other than what has been noted above.  The following changes  have been made:  See above.  Labs/ tests ordered today include:   No orders of the defined types were placed in this encounter.    Disposition:   FU with Dr. Tamala Julian as planned in February.  I will be happy to see him back as well.   Patient is agreeable to this plan and will call if any problems develop in the interim.   SignedTruitt Merle, NP  08/06/2017 2:40 PM  [facility] [address] [place], [place]  [address] Phone: [phone] Fax: [phone]

## 2017-08-06 NOTE — Addendum Note (Signed)
Addended by: Stanton Kidney on: 08/06/2017 03:03 PM   Modules accepted: Orders

## 2017-08-06 NOTE — Patient Instructions (Addendum)
We will be checking the following labs today - BMET, CBC, HPF and Lipids   Medication Instructions:    Continue with your current medicines. BUT  I am increasing the Losartan today to 100 mg - you can take 2 of your 50 mg tablets and use up - the RX for the 100 mg tablet is at your drug store.     Testing/Procedures To Be Arranged:  N/A  Follow-Up:   See Dr. Tamala Julian in February as planned.     Other Special Instructions:   N/A    If you need a refill on your cardiac medications before your next appointment, please call your pharmacy.   Call the [facility] office at [phone] if you have any questions, problems or concerns.

## 2017-08-07 ENCOUNTER — Telehealth: Payer: Self-pay | Admitting: Interventional Cardiology

## 2017-08-07 LAB — CBC
Hematocrit: 37.5 % (ref 37.5–51.0)
Hemoglobin: 11.5 g/dL — ABNORMAL LOW (ref 13.0–17.7)
MCH: 28.3 pg (ref 26.6–33.0)
MCHC: 30.7 g/dL — ABNORMAL LOW (ref 31.5–35.7)
MCV: 92 fL (ref 79–97)
Platelets: 327 10*3/uL (ref 150–379)
RBC: 4.07 x10E6/uL — ABNORMAL LOW (ref 4.14–5.80)
RDW: 13.6 % (ref 12.3–15.4)
WBC: 11.2 10*3/uL — ABNORMAL HIGH (ref 3.4–10.8)

## 2017-08-07 LAB — COMPREHENSIVE METABOLIC PANEL
ALT: 14 IU/L (ref 0–44)
AST: 15 IU/L (ref 0–40)
Albumin/Globulin Ratio: 2 (ref 1.2–2.2)
Albumin: 4.1 g/dL (ref 3.5–4.8)
Alkaline Phosphatase: 88 IU/L (ref 39–117)
BUN/Creatinine Ratio: 15 (ref 10–24)
BUN: 17 mg/dL (ref 8–27)
Bilirubin Total: 0.2 mg/dL (ref 0.0–1.2)
CO2: 22 mmol/L (ref 20–29)
Calcium: 9.5 mg/dL (ref 8.6–10.2)
Chloride: 108 mmol/L — ABNORMAL HIGH (ref 96–106)
Creatinine, Ser: 1.1 mg/dL (ref 0.76–1.27)
GFR calc Af Amer: 76 mL/min/{1.73_m2} (ref >59)
GFR calc non Af Amer: 65 mL/min/{1.73_m2} (ref >59)
Globulin, Total: 2.1 g/dL (ref 1.5–4.5)
Glucose: 59 mg/dL — ABNORMAL LOW (ref 65–99)
Potassium: 4.8 mmol/L (ref 3.5–5.2)
Sodium: 144 mmol/L (ref 134–144)
Total Protein: 6.2 g/dL (ref 6.0–8.5)

## 2017-08-07 LAB — LIPID PANEL
Chol/HDL Ratio: 3.3 ratio (ref 0.0–5.0)
Cholesterol, Total: 128 mg/dL (ref 100–199)
HDL: 39 mg/dL — ABNORMAL LOW (ref >39)
LDL Calculated: 66 mg/dL (ref 0–99)
Triglycerides: 117 mg/dL (ref 0–149)
VLDL Cholesterol Cal: 23 mg/dL (ref 5–40)

## 2017-08-07 NOTE — Telephone Encounter (Signed)
Larry [name] is returning a call about his lab results . Thanks

## 2017-08-07 NOTE — Telephone Encounter (Signed)
Spoke with patient about lab results done 08/06/17

## 2017-08-14 ENCOUNTER — Ambulatory Visit (INDEPENDENT_AMBULATORY_CARE_PROVIDER_SITE_OTHER): Payer: Medicare Other | Admitting: Surgery

## 2017-08-14 ENCOUNTER — Encounter: Payer: Self-pay | Admitting: Surgery

## 2017-08-14 ENCOUNTER — Encounter: Payer: Self-pay | Admitting: *Deleted

## 2017-08-14 VITALS — BP 155/77 | HR 68 | Temp 97.5°F | Resp 20 | Ht 72.0 in | Wt 250.0 lb

## 2017-08-14 DIAGNOSIS — I259 Chronic ischemic heart disease, unspecified: Secondary | ICD-10-CM | POA: Diagnosis not present

## 2017-08-14 DIAGNOSIS — I6522 Occlusion and stenosis of left carotid artery: Secondary | ICD-10-CM

## 2017-08-14 NOTE — H&P (View-Only) (Signed)
Vascular and Vein Specialist of [facility]  Patient name: Larry [name] MRN: [id number] DOB: 02/25/1941 Sex: male   REASON FOR VISIT:    carotid  HISOTRY OF PRESENT ILLNESS:    Larry [name] is a 76 y.o. male who I met in the hospital prior to his CABG in November.  His preoperative studies revealed a high-grade left carotid stenosis.  He was asymptomatic.  He underwent CABG without complication.  He is back today to discuss carotid endarterectomy.  He comes with a CT angiogram as I was concerned about the distal extent of his lesion.  Since his operation, he has had some difficulty with his blood pressure as well as some trouble breathing.  He has chronic lower extremity swelling which has been exacerbated by surgery.  He continues to take his a statin for hypercholesterolemia as well as his antihypertensive medication.  He is on an aspirin a single agent antiplatelet therapy.  The patient remains asymptomatic from his carotid disease.  Specifically, he denies numbness or weakness in either extremity.  He denies slurred speech.  He denies amaurosis fugax.   PAST MEDICAL HISTORY:   Past Medical History:  Diagnosis Date  . Cataracts, bilateral    removed  . Hyperlipemia   . Prostate CA ([place])   . Sleep apnea    Dr Ledora Bottcher ; uses CPAP  . Type II or unspecified type diabetes mellitus without mention of complication, not stated as uncontrolled   . Unspecified essential hypertension      FAMILY HISTORY:   Family History  Problem Relation Age of Onset  . Heart disease Mother   . Diabetes Unknown   . Hypertension Unknown   . Colon cancer Neg Hx     SOCIAL HISTORY:   Social History   Tobacco Use  . Smoking status: Never Smoker  . Smokeless tobacco: Never Used  Substance Use Topics  . Alcohol use: No    Alcohol/week: 0.0 oz     ALLERGIES:   Allergies  Allergen Reactions  . Amlodipine     Leg swelling  . Clindamycin Hcl   .  Verapamil     Edema w/high dose     CURRENT MEDICATIONS:   Current Outpatient Medications  Medication Sig Dispense Refill  . ACCU-CHEK FASTCLIX LANCETS MISC 1 each by Does not apply route 2 (two) times daily. Dx:E11.9 100 each 5  . Ascorbic Acid (VITAMIN C) 1000 MG tablet Take 1,000 mg by mouth daily.    [name] aspirin EC 325 MG EC tablet Take 1 tablet (325 mg total) daily by mouth. 30 tablet 0  . Cholecalciferol (VITAMIN D3) 1000 UNITS CAPS Take 1 capsule by mouth daily.     . diphenhydrAMINE (BENADRYL) 50 MG capsule Take 50 mg by mouth 1 hour prior to your procedure. 1 capsule 0  . furosemide (LASIX) 40 MG tablet Take 1 tablet (40 mg total) daily by mouth. 30 tablet 1  . furosemide (LASIX) 40 MG tablet TAKE 1 TABLET BY MOUTH DAILY AS NEEDED 30 tablet 5  . glimepiride (AMARYL) 2 MG tablet Take 1 tablet (2 mg total) by mouth 2 (two) times daily. (Patient taking differently: Take 2 mg by mouth daily with breakfast. ) 180 tablet 3  . glucose blood (ACCU-CHEK SMARTVIEW) test strip 1 each by Other route 2 (two) times daily. Use to check blood sugars twice a day Dx E11.9 100 each 3  . glucose blood test strip 1 each by Other route 2 (two) times  daily. Please dispense brand of choice per pt and insurance preference. Dx:250.00 100 each 5  . glucose monitoring kit (FREESTYLE) monitoring kit 1 each by Does not apply route as needed for other. Please dispense brand of choice per ins/patient. Dx: 250.00. 1 each 0  . Krill Oil 300 MG CAPS Take 300 mg by mouth daily.    [name] losartan (COZAAR) 100 MG tablet Take 1 tablet (100 mg total) by mouth daily. 90 tablet 3  . metFORMIN (GLUCOPHAGE) 1000 MG tablet Take 1 tablet (1,000 mg total) by mouth 2 (two) times daily with a meal. 180 tablet 3  . metoprolol tartrate (LOPRESSOR) 50 MG tablet Take 1 tablet (50 mg total) 2 (two) times daily by mouth. 60 tablet 3  . Multiple Vitamins-Minerals (MH MACULAR HEALTH PO) Take 1 tablet by mouth 2 (two) times daily.    .  nitroGLYCERIN (NITROSTAT) 0.4 MG SL tablet Place 1 tablet (0.4 mg total) under the tongue every 5 (five) minutes as needed for chest pain. 20 tablet 3  . predniSONE (DELTASONE) 50 MG tablet One tablet (55m) 13 hours prior to procedure; one tablet (531m 7 hours prior to procedure and then one tablet (50 mg) one hour prior to procedure. 3 tablet 0  . simvastatin (ZOCOR) 40 MG tablet Take 1 tablet (40 mg total) by mouth at bedtime. 90 tablet 3  . tamsulosin (FLOMAX) 0.4 MG CAPS capsule Take 1 capsule (0.4 mg total) daily by mouth. 30 capsule 0  . traMADol (ULTRAM) 50 MG tablet Take 1-2 tablets (50-100 mg total) every 4 (four) hours as needed by mouth for moderate pain. 30 tablet 0  . potassium chloride SA (K-DUR,KLOR-CON) 20 MEQ tablet Take 1 tablet (20 mEq total) daily by mouth. (Patient not taking: Reported on 08/14/2017) 30 tablet 1   No current facility-administered medications for this visit.     REVIEW OF SYSTEMS:   _0  denotes positive finding, _1  denotes negative finding Cardiac  Comments:  Chest pain or chest pressure:  No carotid bruits   Shortness of breath upon exertion:    Short of breath when lying flat: x   Irregular heart rhythm:        Vascular    Pain in calf, thigh, or hip brought on by ambulation:    Pain in feet at night that wakes you up from your sleep:     Blood clot in your veins:    Leg swelling:  x       Pulmonary    Oxygen at home:    Productive cough:     Wheezing:         Neurologic    Sudden weakness in arms or legs:     Sudden numbness in arms or legs:     Sudden onset of difficulty speaking or slurred speech:    Temporary loss of vision in one eye:     Problems with dizziness:         Gastrointestinal    Blood in stool:     Vomited blood:         Genitourinary    Burning when urinating:     Blood in urine:        Psychiatric    Major depression:         Hematologic    Bleeding problems:    Problems with blood clotting too easily:         Skin    Rashes or ulcers:  Constitutional    Fever or chills:      PHYSICAL EXAM:   Vitals:   08/14/17 1238  BP: (!) 155/77  Pulse: 68  Resp: 20  Temp: (!) 97.5 F (36.4 C)  TempSrc: Oral  SpO2: 96%  Weight: 250 lb (113.4 kg)  Height: 6' (1.829 m)    GENERAL: The patient is a well-nourished male, in no acute distress. The vital signs are documented above. CARDIAC: There is a regular rate and rhythm.  VASCULAR: No carotid bruits.  2+ pitting edema, bilateral lower extremity PULMONARY: Non-labored respirations ABDOMEN: Soft and non-tender with normal pitched bowel sounds.  MUSCULOSKELETAL: There are no major deformities or cyanosis. NEUROLOGIC: No focal weakness or paresthesias are detected. SKIN: There are no ulcers or rashes noted. PSYCHIATRIC: The patient has a normal affect.  STUDIES:   I have reviewed his CTA neck with the following findings: 1. Positive for High-grade RADIOGRAPHIC STRING SIGN stenosis of the Left ICA bulb over a segment of 8-9 mm. Patent but mildly diminished caliber of the left ICA distal to the stenosis. Visible left ICA siphon is patent with abundant cavernous segment calcified plaque. 2. No significant right carotid stenosis to the proximal siphon. 3. 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 No significant proximal left subclavian stenosis. 4. Only mild bilateral vertebral artery origins stenosis, but moderate to severe vertebral V4 segment plaque and stenosis in the posterior fossa, worse on the right. 5. Widespread proximal great vessel and bilateral CCA circumferential soft plaque.  MEDICAL ISSUES:   Carotid stenosis: We discussed proceeding with left carotid endarterectomy.  I discussed the risks and benefits of the operation including the risk of stroke nerve injury, and cardiopulmonary complications.  All of his questions were answered.  I will try to have this performed on  Thursday, January 3  Lower extremity edema: Patient was giving information about wearing compression stockings.  I also encouraged him to wear them on his upcoming trip to [place].  We also discussed leg exercises on the airplane to prevent DVT.  Hypertension: Recent blood pressure medication changes were made.  He will follow-up with cardiology    [name], MD Vascular and Vein Specialists of [facility] [phone] Pager [phone]

## 2017-08-14 NOTE — Progress Notes (Signed)
 Vascular and Vein Specialist of   Patient name: Larry [name] MRN: [id number] DOB: 06/19/1941 Sex: male   REASON FOR VISIT:    carotid  HISOTRY OF PRESENT ILLNESS:    Larry [name] is a 76 y.o. male who I met in the hospital prior to his CABG in November.  His preoperative studies revealed a high-grade left carotid stenosis.  He was asymptomatic.  He underwent CABG without complication.  He is back today to discuss carotid endarterectomy.  He comes with a CT angiogram as I was concerned about the distal extent of his lesion.  Since his operation, he has had some difficulty with his blood pressure as well as some trouble breathing.  He has chronic lower extremity swelling which has been exacerbated by surgery.  He continues to take his a statin for hypercholesterolemia as well as his antihypertensive medication.  He is on an aspirin a single agent antiplatelet therapy.  The patient remains asymptomatic from his carotid disease.  Specifically, he denies numbness or weakness in either extremity.  He denies slurred speech.  He denies amaurosis fugax.   PAST MEDICAL HISTORY:   Past Medical History:  Diagnosis Date  . Cataracts, bilateral    removed  . Hyperlipemia   . Prostate CA ([facility])   . Sleep apnea    Dr Chance ; uses CPAP  . Type II or unspecified type diabetes mellitus without mention of complication, not stated as uncontrolled   . Unspecified essential hypertension      FAMILY HISTORY:   Family History  Problem Relation Age of Onset  . Heart disease Mother   . Diabetes Unknown   . Hypertension Unknown   . Colon cancer Neg Hx     SOCIAL HISTORY:   Social History   Tobacco Use  . Smoking status: Never Smoker  . Smokeless tobacco: Never Used  Substance Use Topics  . Alcohol use: No    Alcohol/week: 0.0 oz     ALLERGIES:   Allergies  Allergen Reactions  . Amlodipine     Leg swelling  . Clindamycin Hcl   .  Verapamil     Edema w/high dose     CURRENT MEDICATIONS:   Current Outpatient Medications  Medication Sig Dispense Refill  . ACCU-CHEK FASTCLIX LANCETS MISC 1 each by Does not apply route 2 (two) times daily. Dx:E11.9 100 each 5  . Ascorbic Acid (VITAMIN C) 1000 MG tablet Take 1,000 mg by mouth daily.    . aspirin EC 325 MG EC tablet Take 1 tablet (325 mg total) daily by mouth. 30 tablet 0  . Cholecalciferol (VITAMIN D3) 1000 UNITS CAPS Take 1 capsule by mouth daily.     . diphenhydrAMINE (BENADRYL) 50 MG capsule Take 50 mg by mouth 1 hour prior to your procedure. 1 capsule 0  . furosemide (LASIX) 40 MG tablet Take 1 tablet (40 mg total) daily by mouth. 30 tablet 1  . furosemide (LASIX) 40 MG tablet TAKE 1 TABLET BY MOUTH DAILY AS NEEDED 30 tablet 5  . glimepiride (AMARYL) 2 MG tablet Take 1 tablet (2 mg total) by mouth 2 (two) times daily. (Patient taking differently: Take 2 mg by mouth daily with breakfast. ) 180 tablet 3  . glucose blood (ACCU-CHEK SMARTVIEW) test strip 1 each by Other route 2 (two) times daily. Use to check blood sugars twice a day Dx E11.9 100 each 3  . glucose blood test strip 1 each by Other route 2 (two) times   daily. Please dispense brand of choice per pt and insurance preference. Dx:250.00 100 each 5  . glucose monitoring kit (FREESTYLE) monitoring kit 1 each by Does not apply route as needed for other. Please dispense brand of choice per ins/patient. Dx: 250.00. 1 each 0  . Krill Oil 300 MG CAPS Take 300 mg by mouth daily.    . losartan (COZAAR) 100 MG tablet Take 1 tablet (100 mg total) by mouth daily. 90 tablet 3  . metFORMIN (GLUCOPHAGE) 1000 MG tablet Take 1 tablet (1,000 mg total) by mouth 2 (two) times daily with a meal. 180 tablet 3  . metoprolol tartrate (LOPRESSOR) 50 MG tablet Take 1 tablet (50 mg total) 2 (two) times daily by mouth. 60 tablet 3  . Multiple Vitamins-Minerals (MH MACULAR HEALTH PO) Take 1 tablet by mouth 2 (two) times daily.    .  nitroGLYCERIN (NITROSTAT) 0.4 MG SL tablet Place 1 tablet (0.4 mg total) under the tongue every 5 (five) minutes as needed for chest pain. 20 tablet 3  . predniSONE (DELTASONE) 50 MG tablet One tablet (50mg) 13 hours prior to procedure; one tablet (50mg) 7 hours prior to procedure and then one tablet (50 mg) one hour prior to procedure. 3 tablet 0  . simvastatin (ZOCOR) 40 MG tablet Take 1 tablet (40 mg total) by mouth at bedtime. 90 tablet 3  . tamsulosin (FLOMAX) 0.4 MG CAPS capsule Take 1 capsule (0.4 mg total) daily by mouth. 30 capsule 0  . traMADol (ULTRAM) 50 MG tablet Take 1-2 tablets (50-100 mg total) every 4 (four) hours as needed by mouth for moderate pain. 30 tablet 0  . potassium chloride SA (K-DUR,KLOR-CON) 20 MEQ tablet Take 1 tablet (20 mEq total) daily by mouth. (Patient not taking: Reported on 08/14/2017) 30 tablet 1   No current facility-administered medications for this visit.     REVIEW OF SYSTEMS:   [X] denotes positive finding, [ ] denotes negative finding Cardiac  Comments:  Chest pain or chest pressure:  No carotid bruits   Shortness of breath upon exertion:    Short of breath when lying flat: x   Irregular heart rhythm:        Vascular    Pain in calf, thigh, or hip brought on by ambulation:    Pain in feet at night that wakes you up from your sleep:     Blood clot in your veins:    Leg swelling:  x       Pulmonary    Oxygen at home:    Productive cough:     Wheezing:         Neurologic    Sudden weakness in arms or legs:     Sudden numbness in arms or legs:     Sudden onset of difficulty speaking or slurred speech:    Temporary loss of vision in one eye:     Problems with dizziness:         Gastrointestinal    Blood in stool:     Vomited blood:         Genitourinary    Burning when urinating:     Blood in urine:        Psychiatric    Major depression:         Hematologic    Bleeding problems:    Problems with blood clotting too easily:         Skin    Rashes or ulcers:          Constitutional    Fever or chills:      PHYSICAL EXAM:   Vitals:   08/14/17 1238  BP: (!) 155/77  Pulse: 68  Resp: 20  Temp: (!) 97.5 F (36.4 C)  TempSrc: Oral  SpO2: 96%  Weight: 250 lb (113.4 kg)  Height: 6' (1.829 m)    GENERAL: The patient is a well-nourished male, in no acute distress. The vital signs are documented above. CARDIAC: There is a regular rate and rhythm.  VASCULAR: No carotid bruits.  2+ pitting edema, bilateral lower extremity PULMONARY: Non-labored respirations ABDOMEN: Soft and non-tender with normal pitched bowel sounds.  MUSCULOSKELETAL: There are no major deformities or cyanosis. NEUROLOGIC: No focal weakness or paresthesias are detected. SKIN: There are no ulcers or rashes noted. PSYCHIATRIC: The patient has a normal affect.  STUDIES:   I have reviewed his CTA neck with the following findings: 1. Positive for High-grade RADIOGRAPHIC STRING SIGN stenosis of the Left ICA bulb over a segment of 8-9 mm. Patent but mildly diminished caliber of the left ICA distal to the stenosis. Visible left ICA siphon is patent with abundant cavernous segment calcified plaque. 2. No significant right carotid stenosis to the proximal siphon. 3. 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 No significant proximal left subclavian stenosis. 4. Only mild bilateral vertebral artery origins stenosis, but moderate to severe vertebral V4 segment plaque and stenosis in the posterior fossa, worse on the right. 5. Widespread proximal great vessel and bilateral CCA circumferential soft plaque.  MEDICAL ISSUES:   Carotid stenosis: We discussed proceeding with left carotid endarterectomy.  I discussed the risks and benefits of the operation including the risk of stroke nerve injury, and cardiopulmonary complications.  All of his questions were answered.  I will try to have this performed on  Thursday, January 3  Lower extremity edema: Patient was giving information about wearing compression stockings.  I also encouraged him to wear them on his upcoming trip to [place].  We also discussed leg exercises on the airplane to prevent DVT.  Hypertension: Recent blood pressure medication changes were made.  He will follow-up with cardiology    Wells Stevee Valenta, MD Vascular and Vein Specialists of [place] Tel [phone] Pager [phone] 

## 2017-08-15 ENCOUNTER — Other Ambulatory Visit: Payer: Self-pay | Admitting: *Deleted

## 2017-08-16 NOTE — Pre-Procedure Instructions (Signed)
Larry [name]  08/16/2017      [place], [place] [address] [place] [address] Phone: [phone] Fax: [phone]    Your procedure is scheduled on Thursday, August 22, 2017  Report to [facility] Admitting Entrance "A" at 9:30AM  Call this number if you have problems the morning of surgery:  [phone]   Remember:  Do not eat food or drink liquids after midnight.  Take these medicines the morning of surgery with A SIP OF WATER: Metoprolol tartrate (LOPRESSOR). If needed NitroGLYCERIN (NITROSTAT) for chest pain (Notify the nurse if you had to take this medicine).  Follow your doctor's instruction regarding Aspirin.  As of today, stop taking all Aspirins, Vitamins, Fish oils, and Herbal medications. Also stop all NSAIDS i.e. Advil, Ibuprofen, Motrin, Aleve, Anaprox, Naproxen, BC and Goody Powders.  How to Manage Your Diabetes Before and After Surgery  Why is it important to control my blood sugar before and after surgery? . Improving blood sugar levels before and after surgery helps healing and can limit problems. . A way of improving blood sugar control is eating a healthy diet by: o  Eating less sugar and carbohydrates o  Increasing activity/exercise o  Talking with your doctor about reaching your blood sugar goals . High blood sugars (greater than 180 mg/dL) can raise your risk of infections and slow your recovery, so you will need to focus on controlling your diabetes during the weeks before surgery. . Make sure that the doctor who takes care of your diabetes knows about your planned surgery including the date and location.  How do I manage my blood sugar before surgery? . Check your blood sugar at least 4 times a day, starting 2 days before surgery, to make sure that the level is not too high or low. o Check your blood sugar the morning of your surgery when you wake up and every 2 hours  until you get to the Short Stay unit. . If your blood sugar is less than 70 mg/dL, you will need to treat for low blood sugar: o Do not take insulin. o Treat a low blood sugar (less than 70 mg/dL) with  cup of clear juice (cranberry or apple), 4 glucose tablets, OR glucose gel. Recheck blood sugar in 15 minutes after treatment (to make sure it is greater than 70 mg/dL). If your blood sugar is not greater than 70 mg/dL on recheck, call [phone] o  for further instructions. . Report your blood sugar to the short stay nurse when you get to Short Stay.  . If you are admitted to the hospital after surgery: o Your blood sugar will be checked by the staff and you will probably be given insulin after surgery (instead of oral diabetes medicines) to make sure you have good blood sugar levels. o The goal for blood sugar control after surgery is 80-180 mg/dL  WHAT DO I DO ABOUT MY DIABETES MEDICATION?  [name] Do not take Glimepiride (AMARYL) and MetFORMIN (GLUCOPHAGE) the morning of surgery   Do not wear jewelry.  Do not wear lotions, powders,colognes, or deodorant.  Do not shave 48 hours prior to surgery.  Men may shave face and neck.  Do not bring valuables to the hospital.  [facility] is not responsible for any belongings or valuables.  Contacts, dentures or bridgework may not be worn into surgery.  Leave your suitcase in the car.  After surgery  it may be brought to your room.  For patients admitted to the hospital, discharge time will be determined by your treatment team.  Patients discharged the day of surgery will not be allowed to drive home.   Special instructions:  [place]- Preparing For Surgery  Before surgery, you can play an important role. Because skin is not sterile, your skin needs to be as free of germs as possible. You can reduce the number of germs on your skin by washing with CHG (chlorahexidine gluconate) Soap before surgery.  CHG is an antiseptic cleaner which kills germs  and bonds with the skin to continue killing germs even after washing.  Please do not use if you have an allergy to CHG or antibacterial soaps. If your skin becomes reddened/irritated stop using the CHG.  Do not shave (including legs and underarms) for at least 48 hours prior to first CHG shower. It is OK to shave your face.  Please follow these instructions carefully.   1. Shower the NIGHT BEFORE SURGERY and the MORNING OF SURGERY with CHG.   2. If you chose to wash your hair, wash your hair first as usual with your normal shampoo.  3. After you shampoo, rinse your hair and body thoroughly to remove the shampoo.  4. Use CHG as you would any other liquid soap. You can apply CHG directly to the skin and wash gently with a scrungie or a clean washcloth.   5. Apply the CHG Soap to your body ONLY FROM THE NECK DOWN.  Do not use on open wounds or open sores. Avoid contact with your eyes, ears, mouth and genitals (private parts). Wash Face and genitals (private parts)  with your normal soap.  6. Wash thoroughly, paying special attention to the area where your surgery will be performed.  7. Thoroughly rinse your body with warm water from the neck down.  8. DO NOT shower/wash with your normal soap after using and rinsing off the CHG Soap.  9. Pat yourself dry with a CLEAN TOWEL.  10. Wear CLEAN PAJAMAS to bed the night before surgery, wear comfortable clothes the morning of surgery  11. Place CLEAN SHEETS on your bed the night of your first shower and DO NOT SLEEP WITH PETS.  Day of Surgery: Do not apply any deodorants/lotions. Please wear clean clothes to the hospital/surgery center.    Please read over the following fact sheets that you were given. Pain Booklet, Coughing and Deep Breathing, Blood Transfusion Information, MRSA Information and Surgical Site Infection Prevention

## 2017-08-19 ENCOUNTER — Encounter (HOSPITAL_COMMUNITY)
Admission: RE | Admit: 2017-08-19 | Discharge: 2017-08-19 | Disposition: A | Discharge status: Home / Self Care | Payer: Medicare Other | Source: Ambulatory Visit | Attending: Surgery | Admitting: Surgery

## 2017-08-19 ENCOUNTER — Other Ambulatory Visit: Payer: Self-pay

## 2017-08-19 ENCOUNTER — Encounter (HOSPITAL_COMMUNITY): Payer: Self-pay

## 2017-08-19 HISTORY — DX: Occlusion and stenosis of left carotid artery: I65.22

## 2017-08-19 HISTORY — DX: Atherosclerotic heart disease of native coronary artery without angina pectoris: I25.10

## 2017-08-19 LAB — URINALYSIS, ROUTINE W REFLEX MICROSCOPIC
BILIRUBIN URINE: NEGATIVE
GLUCOSE, UA: NEGATIVE mg/dL
HGB URINE DIPSTICK: NEGATIVE
Ketones, ur: NEGATIVE mg/dL
Leukocytes, UA: NEGATIVE
Nitrite: NEGATIVE
PROTEIN: NEGATIVE mg/dL
Specific Gravity, Urine: 1.011 (ref 1.005–1.030)
pH: 5 (ref 5.0–8.0)

## 2017-08-19 LAB — COMPREHENSIVE METABOLIC PANEL
ALBUMIN: 3.6 g/dL (ref 3.5–5.0)
ALK PHOS: 78 U/L (ref 38–126)
ALT: 16 U/L — ABNORMAL LOW (ref 17–63)
ANION GAP: 10 (ref 5–15)
AST: 22 U/L (ref 15–41)
BUN: 19 mg/dL (ref 6–20)
CHLORIDE: 100 mmol/L — AB (ref 101–111)
CO2: 27 mmol/L (ref 22–32)
Calcium: 9.7 mg/dL (ref 8.9–10.3)
Creatinine, Ser: 1.25 mg/dL — ABNORMAL HIGH (ref 0.61–1.24)
GFR calc non Af Amer: 55 mL/min — ABNORMAL LOW (ref >60)
GLUCOSE: 198 mg/dL — AB (ref 65–99)
POTASSIUM: 4 mmol/L (ref 3.5–5.1)
SODIUM: 137 mmol/L (ref 135–145)
Total Bilirubin: 0.9 mg/dL (ref 0.3–1.2)
Total Protein: 6.7 g/dL (ref 6.5–8.1)

## 2017-08-19 LAB — PROTIME-INR
INR: 1.01
Prothrombin Time: 13.2 seconds (ref 11.4–15.2)

## 2017-08-19 LAB — GLUCOSE, CAPILLARY: Glucose-Capillary: 217 mg/dL — ABNORMAL HIGH (ref 65–99)

## 2017-08-19 LAB — CBC
HCT: 39.2 % (ref 39.0–52.0)
HEMOGLOBIN: 12.3 g/dL — AB (ref 13.0–17.0)
MCH: 28.8 pg (ref 26.0–34.0)
MCHC: 31.4 g/dL (ref 30.0–36.0)
MCV: 91.8 fL (ref 78.0–100.0)
PLATELETS: 295 10*3/uL (ref 150–400)
RBC: 4.27 MIL/uL (ref 4.22–5.81)
RDW: 13.8 % (ref 11.5–15.5)
WBC: 9.7 10*3/uL (ref 4.0–10.5)

## 2017-08-19 LAB — TYPE AND SCREEN
ABO/RH(D): O POS
ANTIBODY SCREEN: NEGATIVE

## 2017-08-19 LAB — SURGICAL PCR SCREEN
MRSA, PCR: NEGATIVE
Staphylococcus aureus: NEGATIVE

## 2017-08-19 LAB — APTT: APTT: 32 s (ref 24–36)

## 2017-08-19 NOTE — Progress Notes (Addendum)
PCP - Dr. Lanice Schwab  Cardiologist - Dr. Daneen Schick & Dr. Prescott Gum  Chest x-ray - 08/06/17 (E)  EKG - 08/06/17 (E)  Stress Test - Denies  ECHO - 06/26/17 (E)  Cardiac Cath - 06/24/17 (E)  Sleep Study - Yes CPAP - Yes- Pt sts he head not used it in a few months, and has had no complications  LABS- 40/37/54: CBC,CMP,PT, PTT, T/S, UA  HA1C- 06/25/17: 7.1 Fasting Blood Sugar - 165-170, Today 217   Checks Blood Sugar __1__ times a day Pt sts he was told by his cardiologist to continue taking ASA.  Anesthesia- Yes- Cardiac history  Pt denies having chest pain, sob, or fever at this time. All instructions explained to the pt, with a verbal understanding of the material. Pt agrees to go over the instructions while at home for a better understanding. The opportunity to ask questions was provided.

## 2017-08-21 ENCOUNTER — Encounter (HOSPITAL_COMMUNITY): Payer: Self-pay

## 2017-08-21 NOTE — Progress Notes (Signed)
Anesthesia Chart Review: Patient is a 77 year old male scheduled for left carotid endarterectomy on 08/22/2017 by Dr. Napoleon Form.  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 07/02/17 discharge summary indicates he required flexible cystoscopy for foley placement (and dilation of bladder neck contracture) prior to CABG (Dr. Kathie Rhodes). He had suspected 60-70% proximal right SCA stenosis on recent CTA.  - PCP is Dr. Lew Dawes.  - Cardiologist is Dr. Daneen Schick. Last visit with Truitt Merle, NP on 08/06/17.   - CT surgeon is Dr. Tharon Aquas Trigt. Last visit 07/31/17. He felt patient could safely proceed with left CEA 8 weeks after his CABG. - Pulmonologist is Dr. Danton Sewer (OSA).  Meds include ASA 325 mg, Lasix, Amaryl, losartan, metformin, Lopressor, Nitro, omega-3 fish oil, KCl, Zocor.  BP (!) 141/61   Pulse 79   Temp 36.7 C   Resp 20   Ht 6' (1.829 m)   Wt 246 lb 4.8 oz (111.7 kg)   SpO2 97%   BMI 33.40 kg/m   EKG 08/06/17: SR with occasional PVCs and PACs, right bundle branch block.  INTRA-OP (CABG) TEE 06/27/17:   Septum: No Patent Foramen Ovale present.  Left atrium: Patent foramen ovale not present.  Aortic valve: No regurgitation.  Mitral valve: Trace regurgitation.  LV systolic function is moderately reduced with EF 35-40%.  Echo (TTE) 06/26/17: Study Conclusions - Left ventricle: The cavity size was mildly dilated. There was   mild concentric hypertrophy. Systolic function was mildly to   moderately reduced. The estimated ejection fraction was in the   range of 40% to 45%. Hypokinesis of the anteroseptal, anterior,   and anterolateral myocardium. Doppler parameters are  consistent   with abnormal left ventricular relaxation (grade 1 diastolic   dysfunction). Doppler parameters are consistent with high   ventricular filling pressure. - Aortic valve: Transvalvular velocity was within the normal range.   There was no stenosis. There was no regurgitation. - Mitral valve: Transvalvular velocity was within the normal range.   There was no evidence for stenosis. There was no regurgitation. - Right ventricle: The cavity size was normal. Wall thickness was   normal. Systolic function was normal. - Tricuspid valve: There was mild regurgitation. - Pulmonary arteries: Systolic pressure was within the normal   range. PA peak pressure: 20 mm Hg (S).  Last cardiac cath was pre-CABG (06/24/17) and showed LM and 3V CAD, LV global hypokinesis with estimated EF 35-40%. Elevated end-diastolic pressure consistent with chronic combined systolic and diastolic heart failure. LV dysfunction may be out of proportion for coronary disease identified. He underwent CABG 06/27/17.  CTA neck 08/01/17: IMPRESSION: 1. Positive for High-grade RADIOGRAPHIC STRING SIGN stenosis of the Left ICA bulb over a segment of 8-9 mm. Patent but mildly diminished caliber of the left ICA distal to the stenosis. Visible left ICA siphon is patent with abundant cavernous segment calcified plaque. 2. No significant right carotid stenosis to the proximal siphon. 3. 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 No significant proximal left subclavian stenosis. 4. Only mild bilateral vertebral artery origins stenosis, but moderate to severe vertebral V4 segment plaque and stenosis in the posterior fossa, worse on the right. 5. Widespread proximal great vessel and bilateral CCA circumferential soft plaque.  CXR 07/31/17: IMPRESSION: Mild cardiomegaly.  No active disease.  Prior CABG.  Preoperative labs noted. Cr 1.25. H/H 12.3/39.2. PT/PTT WNL. Glucose 198. A1c  7.1 on 06/25/17. UA negative for leukocytes and nitrites.  If no acute changes then I anticipate that he can proceed as planned.  [facility] Short Stay Center/Anesthesiology Phone [phone] 08/21/2017 10:30 AM

## 2017-08-22 ENCOUNTER — Inpatient Hospital Stay (HOSPITAL_COMMUNITY): Payer: Medicare Other | Admitting: Vascular Surgery

## 2017-08-22 ENCOUNTER — Inpatient Hospital Stay (HOSPITAL_COMMUNITY): Payer: Medicare Other | Admitting: Anesthesiology

## 2017-08-22 ENCOUNTER — Other Ambulatory Visit: Payer: Self-pay

## 2017-08-22 ENCOUNTER — Inpatient Hospital Stay (HOSPITAL_COMMUNITY)
Admission: RE | Admit: 2017-08-22 | Discharge: 2017-08-23 | DRG: 039 | Disposition: A | Discharge status: Home / Self Care | Payer: Medicare Other | Source: Ambulatory Visit | Attending: Surgery | Admitting: Surgery

## 2017-08-22 ENCOUNTER — Encounter (HOSPITAL_COMMUNITY): Payer: Self-pay

## 2017-08-22 ENCOUNTER — Encounter (HOSPITAL_COMMUNITY)
Admission: RE | Disposition: A | Discharge status: Home / Self Care | Payer: Self-pay | Source: Ambulatory Visit | Attending: Surgery

## 2017-08-22 DIAGNOSIS — I6522 Occlusion and stenosis of left carotid artery: Principal | ICD-10-CM | POA: Diagnosis present

## 2017-08-22 DIAGNOSIS — E785 Hyperlipidemia, unspecified: Secondary | ICD-10-CM | POA: Diagnosis present

## 2017-08-22 DIAGNOSIS — Z8546 Personal history of malignant neoplasm of prostate: Secondary | ICD-10-CM

## 2017-08-22 DIAGNOSIS — Z951 Presence of aortocoronary bypass graft: Secondary | ICD-10-CM | POA: Diagnosis not present

## 2017-08-22 DIAGNOSIS — I10 Essential (primary) hypertension: Secondary | ICD-10-CM | POA: Diagnosis not present

## 2017-08-22 DIAGNOSIS — R600 Localized edema: Secondary | ICD-10-CM | POA: Diagnosis not present

## 2017-08-22 DIAGNOSIS — G4730 Sleep apnea, unspecified: Secondary | ICD-10-CM | POA: Diagnosis present

## 2017-08-22 DIAGNOSIS — Z888 Allergy status to other drugs, medicaments and biological substances status: Secondary | ICD-10-CM

## 2017-08-22 DIAGNOSIS — I2510 Atherosclerotic heart disease of native coronary artery without angina pectoris: Secondary | ICD-10-CM | POA: Diagnosis not present

## 2017-08-22 DIAGNOSIS — Z881 Allergy status to other antibiotic agents status: Secondary | ICD-10-CM | POA: Diagnosis not present

## 2017-08-22 DIAGNOSIS — E119 Type 2 diabetes mellitus without complications: Secondary | ICD-10-CM | POA: Diagnosis not present

## 2017-08-22 DIAGNOSIS — I779 Disorder of arteries and arterioles, unspecified: Secondary | ICD-10-CM | POA: Diagnosis present

## 2017-08-22 DIAGNOSIS — I6529 Occlusion and stenosis of unspecified carotid artery: Secondary | ICD-10-CM | POA: Diagnosis present

## 2017-08-22 DIAGNOSIS — I25810 Atherosclerosis of coronary artery bypass graft(s) without angina pectoris: Secondary | ICD-10-CM | POA: Diagnosis not present

## 2017-08-22 HISTORY — PX: PATCH ANGIOPLASTY: SHX6230

## 2017-08-22 HISTORY — PX: ENDARTERECTOMY: SHX5162

## 2017-08-22 LAB — CREATININE, SERUM
Creatinine, Ser: 1.03 mg/dL (ref 0.61–1.24)
GFR calc Af Amer: >60 mL/min (ref >60)
GFR calc non Af Amer: >60 mL/min (ref >60)

## 2017-08-22 LAB — GLUCOSE, CAPILLARY
GLUCOSE-CAPILLARY: 166 mg/dL — AB (ref 65–99)
GLUCOSE-CAPILLARY: 144 mg/dL — AB (ref 65–99)
Glucose-Capillary: 115 mg/dL — ABNORMAL HIGH (ref 65–99)

## 2017-08-22 LAB — CBC
HCT: 38.9 % — ABNORMAL LOW (ref 39.0–52.0)
Hemoglobin: 12 g/dL — ABNORMAL LOW (ref 13.0–17.0)
MCH: 28 pg (ref 26.0–34.0)
MCHC: 30.8 g/dL (ref 30.0–36.0)
MCV: 90.9 fL (ref 78.0–100.0)
PLATELETS: 256 10*3/uL (ref 150–400)
RBC: 4.28 MIL/uL (ref 4.22–5.81)
RDW: 13.6 % (ref 11.5–15.5)
WBC: 13 10*3/uL — ABNORMAL HIGH (ref 4.0–10.5)

## 2017-08-22 SURGERY — ENDARTERECTOMY, CAROTID
Anesthesia: General | Site: Neck | Laterality: Left

## 2017-08-22 MED ORDER — SUGAMMADEX SODIUM 200 MG/2ML IV SOLN
INTRAVENOUS | Status: DC | PRN
  Administered 2017-08-22: 100 mg via INTRAVENOUS

## 2017-08-22 MED ORDER — METOPROLOL TARTRATE 5 MG/5ML IV SOLN: 2.0000 mg | INTRAVENOUS | Status: DC | PRN

## 2017-08-22 MED ORDER — ENOXAPARIN SODIUM 30 MG/0.3ML ~~LOC~~ SOLN: 30.0000 mg | SUBCUTANEOUS | Status: DC

## 2017-08-22 MED ORDER — EPHEDRINE 5 MG/ML INJ
INTRAVENOUS | Status: AC
  Filled 2017-08-22: qty 20

## 2017-08-22 MED ORDER — SODIUM CHLORIDE 0.9 % IV SOLN
0.0500 ug/kg/min | INTRAVENOUS | Status: AC
  Administered 2017-08-22: .2 ug/kg/min via INTRAVENOUS
  Filled 2017-08-22: qty 5000

## 2017-08-22 MED ORDER — LABETALOL HCL 5 MG/ML IV SOLN
INTRAVENOUS | Status: AC
  Filled 2017-08-22: qty 4

## 2017-08-22 MED ORDER — LIDOCAINE HCL (CARDIAC) 20 MG/ML IV SOLN
INTRAVENOUS | Status: DC | PRN
  Administered 2017-08-22: 100 mg via INTRAVENOUS

## 2017-08-22 MED ORDER — ONDANSETRON HCL 4 MG/2ML IJ SOLN
INTRAMUSCULAR | Status: DC | PRN
  Administered 2017-08-22: 4 mg via INTRAVENOUS

## 2017-08-22 MED ORDER — MORPHINE SULFATE (PF) 2 MG/ML IV SOLN: 1.0000 mg | INTRAVENOUS | Status: DC | PRN

## 2017-08-22 MED ORDER — METOPROLOL TARTRATE 50 MG PO TABS
50.0000 mg | ORAL_TABLET | Freq: Two times a day (BID) | ORAL | Status: DC
  Administered 2017-08-22 – 2017-08-23 (×2): 50 mg via ORAL
  Filled 2017-08-22 (×2): qty 1

## 2017-08-22 MED ORDER — OXYCODONE HCL 5 MG PO TABS: 5.0000 mg | ORAL_TABLET | Freq: Once | ORAL | Status: DC | PRN

## 2017-08-22 MED ORDER — LABETALOL HCL 5 MG/ML IV SOLN
10.0000 mg | INTRAVENOUS | Status: AC | PRN
  Administered 2017-08-22 (×4): 10 mg via INTRAVENOUS
  Filled 2017-08-22 (×4): qty 4

## 2017-08-22 MED ORDER — CHLORHEXIDINE GLUCONATE 4 % EX LIQD: 60.0000 mL | Freq: Once | CUTANEOUS | Status: DC

## 2017-08-22 MED ORDER — SENNOSIDES-DOCUSATE SODIUM 8.6-50 MG PO TABS: 1.0000 | ORAL_TABLET | Freq: Every evening | ORAL | Status: DC | PRN

## 2017-08-22 MED ORDER — LACTATED RINGERS IV SOLN
INTRAVENOUS | Status: DC | PRN
  Administered 2017-08-22: 09:00:00 via INTRAVENOUS

## 2017-08-22 MED ORDER — DEXAMETHASONE SODIUM PHOSPHATE 10 MG/ML IJ SOLN
INTRAMUSCULAR | Status: AC
  Filled 2017-08-22: qty 1

## 2017-08-22 MED ORDER — DEXTROSE 5 % IV SOLN
1.5000 g | Freq: Two times a day (BID) | INTRAVENOUS | Status: AC
  Administered 2017-08-22 – 2017-08-23 (×2): 1.5 g via INTRAVENOUS
  Filled 2017-08-22 (×3): qty 1.5

## 2017-08-22 MED ORDER — SODIUM CHLORIDE 0.9 % IV SOLN
INTRAVENOUS | Status: DC | PRN
  Administered 2017-08-22: 14:00:00

## 2017-08-22 MED ORDER — PANTOPRAZOLE SODIUM 40 MG PO TBEC
40.0000 mg | DELAYED_RELEASE_TABLET | Freq: Every day | ORAL | Status: DC
  Administered 2017-08-23: 40 mg via ORAL
  Filled 2017-08-22: qty 1

## 2017-08-22 MED ORDER — ESMOLOL HCL 100 MG/10ML IV SOLN
INTRAVENOUS | Status: DC | PRN
  Administered 2017-08-22: 50 mg via INTRAVENOUS
  Administered 2017-08-22: 40 mg via INTRAVENOUS

## 2017-08-22 MED ORDER — FUROSEMIDE 40 MG PO TABS: 40.0000 mg | ORAL_TABLET | Freq: Every day | ORAL | Status: DC | PRN

## 2017-08-22 MED ORDER — 0.9 % SODIUM CHLORIDE (POUR BTL) OPTIME
TOPICAL | Status: DC | PRN
  Administered 2017-08-22: 2000 mL

## 2017-08-22 MED ORDER — ROCURONIUM BROMIDE 100 MG/10ML IV SOLN
INTRAVENOUS | Status: DC | PRN
  Administered 2017-08-22: 60 mg via INTRAVENOUS

## 2017-08-22 MED ORDER — DEXAMETHASONE SODIUM PHOSPHATE 10 MG/ML IJ SOLN
INTRAMUSCULAR | Status: DC | PRN
  Administered 2017-08-22: 5 mg via INTRAVENOUS

## 2017-08-22 MED ORDER — LIDOCAINE 2% (20 MG/ML) 5 ML SYRINGE
INTRAMUSCULAR | Status: AC
  Filled 2017-08-22: qty 5

## 2017-08-22 MED ORDER — POTASSIUM CHLORIDE CRYS ER 20 MEQ PO TBCR
20.0000 meq | EXTENDED_RELEASE_TABLET | Freq: Every day | ORAL | Status: DC
  Administered 2017-08-23: 20 meq via ORAL
  Filled 2017-08-22: qty 1

## 2017-08-22 MED ORDER — VITAMIN D 1000 UNITS PO TABS
1000.0000 [IU] | ORAL_TABLET | Freq: Every day | ORAL | Status: DC
  Administered 2017-08-23: 1000 [IU] via ORAL
  Filled 2017-08-22: qty 1

## 2017-08-22 MED ORDER — OMEGA-3-ACID ETHYL ESTERS 1 G PO CAPS
1000.0000 mg | ORAL_CAPSULE | Freq: Every day | ORAL | Status: DC
  Administered 2017-08-23: 1000 mg via ORAL
  Filled 2017-08-22: qty 1

## 2017-08-22 MED ORDER — MEPERIDINE HCL 25 MG/ML IJ SOLN: 6.2500 mg | INTRAMUSCULAR | Status: DC | PRN

## 2017-08-22 MED ORDER — GLIMEPIRIDE 4 MG PO TABS
2.0000 mg | ORAL_TABLET | Freq: Every day | ORAL | Status: DC
  Administered 2017-08-23: 2 mg via ORAL
  Filled 2017-08-22: qty 1

## 2017-08-22 MED ORDER — NITROGLYCERIN 0.4 MG SL SUBL: 0.4000 mg | SUBLINGUAL_TABLET | SUBLINGUAL | Status: DC | PRN

## 2017-08-22 MED ORDER — FENTANYL CITRATE (PF) 100 MCG/2ML IJ SOLN
INTRAMUSCULAR | Status: AC
  Filled 2017-08-22: qty 2

## 2017-08-22 MED ORDER — LABETALOL HCL 5 MG/ML IV SOLN
INTRAVENOUS | Status: DC | PRN
  Administered 2017-08-22 (×2): 10 mg via INTRAVENOUS

## 2017-08-22 MED ORDER — SODIUM CHLORIDE 0.9 % IV SOLN: INTRAVENOUS | Status: DC

## 2017-08-22 MED ORDER — FENTANYL CITRATE (PF) 100 MCG/2ML IJ SOLN
25.0000 ug | INTRAMUSCULAR | Status: DC | PRN
  Administered 2017-08-22 (×3): 50 ug via INTRAVENOUS

## 2017-08-22 MED ORDER — HEPARIN SODIUM (PORCINE) 1000 UNIT/ML IJ SOLN
INTRAMUSCULAR | Status: AC
  Filled 2017-08-22: qty 1

## 2017-08-22 MED ORDER — SODIUM CHLORIDE 0.9 % IV SOLN: 500.0000 mL | Freq: Once | INTRAVENOUS | Status: DC | PRN

## 2017-08-22 MED ORDER — POTASSIUM CHLORIDE CRYS ER 20 MEQ PO TBCR: 20.0000 meq | EXTENDED_RELEASE_TABLET | Freq: Every day | ORAL | Status: DC | PRN

## 2017-08-22 MED ORDER — MIDAZOLAM HCL 2 MG/2ML IJ SOLN
INTRAMUSCULAR | Status: AC
  Filled 2017-08-22: qty 2

## 2017-08-22 MED ORDER — PROPOFOL 10 MG/ML IV BOLUS
INTRAVENOUS | Status: DC | PRN
  Administered 2017-08-22: 150 mg via INTRAVENOUS

## 2017-08-22 MED ORDER — ALUM & MAG HYDROXIDE-SIMETH 200-200-20 MG/5ML PO SUSP: 15.0000 mL | ORAL | Status: DC | PRN

## 2017-08-22 MED ORDER — SUCCINYLCHOLINE CHLORIDE 200 MG/10ML IV SOSY
PREFILLED_SYRINGE | INTRAVENOUS | Status: AC
  Filled 2017-08-22: qty 10

## 2017-08-22 MED ORDER — EPHEDRINE SULFATE 50 MG/ML IJ SOLN
INTRAMUSCULAR | Status: DC | PRN
  Administered 2017-08-22: 15 mg via INTRAVENOUS
  Administered 2017-08-22: 10 mg via INTRAVENOUS

## 2017-08-22 MED ORDER — DOCUSATE SODIUM 100 MG PO CAPS
100.0000 mg | ORAL_CAPSULE | Freq: Every day | ORAL | Status: DC
  Administered 2017-08-23: 100 mg via ORAL
  Filled 2017-08-22: qty 1

## 2017-08-22 MED ORDER — PHENOL 1.4 % MT LIQD: 1.0000 | OROMUCOSAL | Status: DC | PRN

## 2017-08-22 MED ORDER — SUGAMMADEX SODIUM 200 MG/2ML IV SOLN
INTRAVENOUS | Status: AC
  Filled 2017-08-22: qty 2

## 2017-08-22 MED ORDER — ASPIRIN EC 325 MG PO TBEC
325.0000 mg | DELAYED_RELEASE_TABLET | Freq: Every day | ORAL | Status: DC
  Administered 2017-08-23: 325 mg via ORAL
  Filled 2017-08-22: qty 1

## 2017-08-22 MED ORDER — METFORMIN HCL 500 MG PO TABS
1000.0000 mg | ORAL_TABLET | Freq: Two times a day (BID) | ORAL | Status: DC
  Administered 2017-08-23: 1000 mg via ORAL
  Filled 2017-08-22: qty 2

## 2017-08-22 MED ORDER — PROTAMINE SULFATE 10 MG/ML IV SOLN
INTRAVENOUS | Status: DC | PRN
  Administered 2017-08-22: 40 mg via INTRAVENOUS
  Administered 2017-08-22: 10 mg via INTRAVENOUS

## 2017-08-22 MED ORDER — ACETAMINOPHEN 650 MG RE SUPP: 325.0000 mg | RECTAL | Status: DC | PRN

## 2017-08-22 MED ORDER — MAGNESIUM SULFATE 2 GM/50ML IV SOLN: 2.0000 g | Freq: Every day | INTRAVENOUS | Status: DC | PRN

## 2017-08-22 MED ORDER — HEPARIN SODIUM (PORCINE) 1000 UNIT/ML IJ SOLN
INTRAMUSCULAR | Status: DC | PRN
Start: 2017-08-22 — End: 2017-08-22
  Administered 2017-08-22: 1000 [IU] via INTRAVENOUS
  Administered 2017-08-22: 2000 [IU] via INTRAVENOUS
  Administered 2017-08-22: 10000 [IU] via INTRAVENOUS

## 2017-08-22 MED ORDER — ESMOLOL HCL 100 MG/10ML IV SOLN
INTRAVENOUS | Status: AC
  Filled 2017-08-22: qty 10

## 2017-08-22 MED ORDER — SIMVASTATIN 40 MG PO TABS
40.0000 mg | ORAL_TABLET | Freq: Every day | ORAL | Status: DC
  Administered 2017-08-22: 40 mg via ORAL
  Filled 2017-08-22: qty 1

## 2017-08-22 MED ORDER — OXYCODONE-ACETAMINOPHEN 5-325 MG PO TABS
1.0000 | ORAL_TABLET | ORAL | Status: DC | PRN
Start: 2017-08-22 — End: 2017-08-23

## 2017-08-22 MED ORDER — ONDANSETRON HCL 4 MG/2ML IJ SOLN
INTRAMUSCULAR | Status: AC
  Filled 2017-08-22: qty 2

## 2017-08-22 MED ORDER — ACETAMINOPHEN 325 MG PO TABS: 325.0000 mg | ORAL_TABLET | ORAL | Status: DC | PRN

## 2017-08-22 MED ORDER — OXYCODONE HCL 5 MG/5ML PO SOLN: 5.0000 mg | Freq: Once | ORAL | Status: DC | PRN

## 2017-08-22 MED ORDER — GUAIFENESIN-DM 100-10 MG/5ML PO SYRP: 15.0000 mL | ORAL_SOLUTION | ORAL | Status: DC | PRN

## 2017-08-22 MED ORDER — FENTANYL CITRATE (PF) 250 MCG/5ML IJ SOLN
INTRAMUSCULAR | Status: AC
  Filled 2017-08-22: qty 5

## 2017-08-22 MED ORDER — HEMOSTATIC AGENTS (NO CHARGE) OPTIME
TOPICAL | Status: DC | PRN
  Administered 2017-08-22: 1 via TOPICAL

## 2017-08-22 MED ORDER — BISACODYL 5 MG PO TBEC: 5.0000 mg | DELAYED_RELEASE_TABLET | Freq: Every day | ORAL | Status: DC | PRN

## 2017-08-22 MED ORDER — LIDOCAINE HCL (PF) 1 % IJ SOLN
INTRAMUSCULAR | Status: AC
  Filled 2017-08-22: qty 30

## 2017-08-22 MED ORDER — ONDANSETRON HCL 4 MG/2ML IJ SOLN: 4.0000 mg | Freq: Four times a day (QID) | INTRAMUSCULAR | Status: DC | PRN

## 2017-08-22 MED ORDER — PHENYLEPHRINE HCL 10 MG/ML IJ SOLN
INTRAMUSCULAR | Status: DC | PRN
  Administered 2017-08-22 (×2): 40 ug via INTRAVENOUS

## 2017-08-22 MED ORDER — PROMETHAZINE HCL 25 MG/ML IJ SOLN: 6.2500 mg | INTRAMUSCULAR | Status: DC | PRN

## 2017-08-22 MED ORDER — DEXTROSE 5 % IV SOLN
INTRAVENOUS | Status: DC | PRN
  Administered 2017-08-22: 30 ug/min via INTRAVENOUS

## 2017-08-22 MED ORDER — LACTATED RINGERS IV SOLN
INTRAVENOUS | Status: DC | PRN
  Administered 2017-08-22: 12:00:00 via INTRAVENOUS

## 2017-08-22 MED ORDER — HYDRALAZINE HCL 20 MG/ML IJ SOLN
5.0000 mg | INTRAMUSCULAR | Status: AC | PRN
  Administered 2017-08-23 (×2): 5 mg via INTRAVENOUS
  Filled 2017-08-22 (×2): qty 1

## 2017-08-22 MED ORDER — PHENYLEPHRINE 40 MCG/ML (10ML) SYRINGE FOR IV PUSH (FOR BLOOD PRESSURE SUPPORT)
PREFILLED_SYRINGE | INTRAVENOUS | Status: AC
  Filled 2017-08-22: qty 20

## 2017-08-22 MED ORDER — DEXTROSE 5 % IV SOLN
1.5000 g | INTRAVENOUS | Status: AC
  Administered 2017-08-22: 1.5 g via INTRAVENOUS
  Filled 2017-08-22: qty 1.5

## 2017-08-22 MED ORDER — LOSARTAN POTASSIUM 50 MG PO TABS
100.0000 mg | ORAL_TABLET | Freq: Every evening | ORAL | Status: DC
  Filled 2017-08-22: qty 2

## 2017-08-22 SURGICAL SUPPLY — 48 items
ADH SKN CLS APL DERMABOND .7 (GAUZE/BANDAGES/DRESSINGS) ×1
CANISTER SUCT 3000ML PPV (MISCELLANEOUS) ×3 IMPLANT
CATH ROBINSON RED A/P 18FR (CATHETERS) ×3 IMPLANT
CATH SUCT 10FR WHISTLE TIP (CATHETERS) ×5 IMPLANT
CLIP VESOCCLUDE MED 6/CT (CLIP) ×5 IMPLANT
CLIP VESOCCLUDE SM WIDE 6/CT (CLIP) ×5 IMPLANT
COVER PROBE W GEL 5X96 (DRAPES) ×3 IMPLANT
CRADLE DONUT ADULT HEAD (MISCELLANEOUS) ×3 IMPLANT
DERMABOND ADVANCED (GAUZE/BANDAGES/DRESSINGS) ×2
DERMABOND ADVANCED .7 DNX12 (GAUZE/BANDAGES/DRESSINGS) ×1 IMPLANT
DRAIN CHANNEL 15F RND FF W/TCR (WOUND CARE) IMPLANT
ELECT REM PT RETURN 9FT ADLT (ELECTROSURGICAL) ×3
ELECTRODE REM PT RTRN 9FT ADLT (ELECTROSURGICAL) ×1 IMPLANT
EVACUATOR SILICONE 100CC (DRAIN) IMPLANT
GLOVE BIOGEL PI IND STRL 7.5 (GLOVE) ×1 IMPLANT
GLOVE BIOGEL PI INDICATOR 7.5 (GLOVE) ×2
GLOVE SURG SS PI 7.5 STRL IVOR (GLOVE) ×3 IMPLANT
GOWN STRL REUS W/ TWL LRG LVL3 (GOWN DISPOSABLE) ×2 IMPLANT
GOWN STRL REUS W/ TWL XL LVL3 (GOWN DISPOSABLE) ×1 IMPLANT
GOWN STRL REUS W/TWL LRG LVL3 (GOWN DISPOSABLE) ×9
GOWN STRL REUS W/TWL XL LVL3 (GOWN DISPOSABLE) ×3
HEMOSTAT SNOW SURGICEL 2X4 (HEMOSTASIS) ×2 IMPLANT
INSERT FOGARTY SM (MISCELLANEOUS) IMPLANT
KIT BASIN OR (CUSTOM PROCEDURE TRAY) ×3 IMPLANT
KIT ROOM TURNOVER OR (KITS) ×3 IMPLANT
KIT SHUNT ARGYLE CAROTID ART 6 (VASCULAR PRODUCTS) IMPLANT
NDL HYPO 25GX1X1/2 BEV (NEEDLE) IMPLANT
NEEDLE HYPO 25GX1X1/2 BEV (NEEDLE) IMPLANT
NS IRRIG 1000ML POUR BTL (IV SOLUTION) ×7 IMPLANT
PACK CAROTID (CUSTOM PROCEDURE TRAY) ×3 IMPLANT
PAD ARMBOARD 7.5X6 YLW CONV (MISCELLANEOUS) ×6 IMPLANT
PATCH VASC XENOSURE 1CMX6CM (Vascular Products) ×3 IMPLANT
PATCH VASC XENOSURE 1X6 (Vascular Products) IMPLANT
SHUNT CAROTID BYPASS 10 (VASCULAR PRODUCTS) IMPLANT
SHUNT CAROTID BYPASS 12FRX15.5 (VASCULAR PRODUCTS) IMPLANT
SUT ETHILON 3 0 PS 1 (SUTURE) IMPLANT
SUT PROLENE 5 0 C 1 24 (SUTURE) ×2 IMPLANT
SUT PROLENE 6 0 BV (SUTURE) ×9 IMPLANT
SUT PROLENE 7 0 BV 1 (SUTURE) IMPLANT
SUT PROLENE 7 0 BV1 MDA (SUTURE) ×2 IMPLANT
SUT SILK 3 0 (SUTURE)
SUT SILK 3-0 18XBRD TIE 12 (SUTURE) IMPLANT
SUT VIC AB 3-0 SH 27 (SUTURE) ×9
SUT VIC AB 3-0 SH 27X BRD (SUTURE) ×2 IMPLANT
SUT VICRYL 4-0 PS2 18IN ABS (SUTURE) ×3 IMPLANT
SYR CONTROL 10ML LL (SYRINGE) IMPLANT
TOWEL GREEN STERILE (TOWEL DISPOSABLE) ×3 IMPLANT
WATER STERILE IRR 1000ML POUR (IV SOLUTION) ×3 IMPLANT

## 2017-08-22 NOTE — Progress Notes (Signed)
S/p L CEA  Neuro intact Neck incision without hematoma Will transfer to floor soon Anticipate d/c tomorrow.  [name]

## 2017-08-22 NOTE — Op Note (Signed)
Patient name: Larry [name] MRN: [id number] DOB: 10/26/1940 Sex: male  08/22/2017 Pre-operative Diagnosis: Asymptomatic   left carotid stenosis Post-operative diagnosis:  Same Surgeon:  [name] Assistants:  Lennie Muckle Procedure:    left carotid Endarterectomy with bovine pericardial patch angioplasty Anesthesia:  General Blood Loss:  See anesthesia record Specimens:  none  Findings: 95%stenosis; Thrombus:  none  Indications: This is a 77 year old gentleman who was found to have an asymptomatic high-grade left carotid stenosis during his preoperative workup for CABG.  He has recovered from his heart surgery and is here today fro carotid endarterectomy.  Procedure:  The patient was identified in the holding area and taken to [place] [address]  The patient was then placed supine on the table.   General endotrachial anesthesia was administered.  The patient was prepped and draped in the usual sterile fashion.  A time out was called and antibiotics were administered.  The incision was made along the anterior border of the left sternocleidomastoid muscle.  Cautery was used to dissect through the subcutaneous tissue.  The platysma muscle was divided with cautery.  The internal jugular vein was exposed along its anterior medial border.  The common facial vein was exposed and then divided between 2-0 silk ties and metal clips.  The common carotid artery was then circumferentially exposed and encircled with an umbilical tape.  The vagus nerve was identified and protected.  Next sharp dissection was used to expose the external carotid artery and the superior thyroid artery.  The were encircled with a blue vessel loop and a 2-0 silk tie respectively.  Finally, the internal carotid was carefully dissected free.  An umbilical tape was placed around the internal carotid artery distal to the diseased segment.  The hypoglossal nerve was visualized throughout and protected.  The patient was given systemic  heparinization.  A bovine carotid patch was selected and prepared on the back table.  A 10 french shunt was also prepared.  After blood pressure readings were appropriate and the heparin had been given time to circulate, the internal carotid artery was occluded with a baby Gregory clamp.  The external and common carotid arteries were then occluded with vascular clamps and the 2-0 tie tightened on the superior thyroid artery.  A #11 blade was used to make an arteriotomy in the common carotid artery.  This was extended with Potts scissors along the anterior and lateral border of the common and internal carotid artery.  Approximately 95% stenosis was identified.  There was no thrombus identified.  The 10 french shunt was not placed, as there was adequate backbleeding..  A kleiner kuntz elevator was used to perform endarterectomy.  An eversion endarterectomy was performed in the external carotid artery.  A good distal endpoint was obtained in the internal carotid artery.  The specimen was removed and sent to pathology.  Heparinized saline was used to irrigate the endarterectomized field.  All potential embolic debris was removed.  Bovine pericardial patch angioplasty was then performed using a running 6-0 Prolene. . The common internal and external carotid arteries were all appropriately flushed. The artery was again irrigated with heparin saline.  The anastomosis was then secured. The clamp was first released on the external carotid artery followed by the common carotid artery approximately 30 seconds later, bloodflow was reestablish through the internal carotid artery.  Next, a hand-held  Doppler was used to evaluate the signals in the common, external, and internal  carotid arteries, all of which  had appropriate signals. I then administered  50 mg protamine. The wound was then irrigated.  After hemostasis was achieved, the carotid sheath was reapproximated with 3-0 Vicryl. The  platysma muscle was reapproximated  with running 3-0 Vicryl. The skin  was closed with 4-0 Vicryl. Dermabond was placed on the skin. The  patient was then successfully extubated. His neurologic exam was  similar to his preprocedural exam. The patient was then taken to recovery room  in stable condition. There were no complications.     Disposition:  To PACU in stable condition, neurologically intact.  Relevant Operative Details: Normal location of carotid bifurcation.  There was a fair amount of inflammatory tissue around the internal carotid artery.  The disease went up into the internal carotid artery approximately 2.5 cm.  I did not place a shunt because there was adequate backbleeding.  Endarterectomy was performed with bovine pericardial patch angioplasty.  Theotis Burrow, M.D. [facility] Office: [phone] Pager:  [phone]

## 2017-08-22 NOTE — Anesthesia Preprocedure Evaluation (Signed)
Anesthesia Evaluation  Patient identified by MRN, date of birth, ID band Patient awake    Reviewed: Allergy & Precautions, NPO status , Patient's Chart, lab work & pertinent test results  Airway Mallampati: II  TM Distance: >3 FB     Dental   Pulmonary sleep apnea , pneumonia,    breath sounds clear to auscultation       Cardiovascular hypertension, + CAD, + Peripheral Vascular Disease and + DOE   Rhythm:Regular Rate:Normal  TEE 06/2017 Left Ventricle LV systolic function is moderately reduced with an EF of 35-40%. Septum Normal atrial septum with no atrial septal defect and normal septal motion. No Patent Foramen Ovale present. Left Atrium Normal cavity size. Aortic Valve Normal valve structure. No regurgitation. Mitral Valve Normal valve structure. Trace regurgitation. Tricuspid Valve Normal tricuspid valve structure.     Neuro/Psych  Headaches,  Neuromuscular disease    GI/Hepatic negative GI ROS, Neg liver ROS,   Endo/Other  diabetes  Renal/GU negative Renal ROS     Musculoskeletal   Abdominal   Peds  Hematology   Anesthesia Other Findings   Reproductive/Obstetrics                             Anesthesia Physical  Anesthesia Plan  ASA: III  Anesthesia Plan: General   Post-op Pain Management:    Induction: Intravenous  PONV Risk Score and Plan: 2 and Treatment may vary due to age or medical condition, Ondansetron and Dexamethasone  Airway Management Planned: Oral ETT  Additional Equipment: Arterial line  Intra-op Plan:   Post-operative Plan: Extubation in OR  Informed Consent: I have reviewed the patients History and Physical, chart, labs and discussed the procedure including the risks, benefits and alternatives for the proposed anesthesia with the patient or authorized representative who has indicated his/her understanding and acceptance.   Dental advisory given  Plan  Discussed with: CRNA  Anesthesia Plan Comments: (Remi gtt)        Anesthesia Quick Evaluation

## 2017-08-22 NOTE — Transfer of Care (Signed)
Immediate Anesthesia Transfer of Care Note  Patient: Larry [name]  Procedure(s) Performed: ENDARTERECTOMY CAROTID LEFT (Left Neck) PATCH ANGIOPLASTY USING XENOSURE BIOLOGIC PATCH (Left Neck)  Patient Location: PACU  Anesthesia Type:General  Level of Consciousness: awake, alert  and patient cooperative  Airway & Oxygen Therapy: Patient Spontanous Breathing and Patient connected to nasal cannula oxygen  Post-op Assessment: Report given to RN and Post -op Vital signs reviewed and stable  Post vital signs: Reviewed and stable  Last Vitals:  Vitals:   08/22/17 0909  BP: (!) 161/70  Pulse: 71  Resp: 18  Temp: 36.6 C  SpO2: 99%    Last Pain:  Vitals:   08/22/17 0909  TempSrc: Oral      Patients Stated Pain Goal: 3 (97/28/20 6015)  Complications: No apparent anesthesia complications

## 2017-08-22 NOTE — Discharge Instructions (Signed)
   [facility]  Discharge Instructions   Carotid Endarterectomy (CEA)  Please refer to the following instructions for your post-procedure care. Your surgeon or physician assistant will discuss any changes with you.  Activity  You are encouraged to walk as much as you can. You can slowly return to normal activities but must avoid strenuous activity and heavy lifting until your doctor tell you it's OK. Avoid activities such as vacuuming or swinging a golf club. You can drive after one week if you are comfortable and you are no longer taking prescription pain medications. It is normal to feel tired for serval weeks after your surgery. It is also normal to have difficulty with sleep habits, eating, and bowel movements after surgery. These will go away with time.  Bathing/Showering  You may shower after you come home. Do not soak in a bathtub, hot tub, or swim until the incision heals completely.  Incision Care  Shower every day. Clean your incision with mild soap and water. Pat the area dry with a clean towel. You do not need a bandage unless otherwise instructed. Do not apply any ointments or creams to your incision. You may have skin glue on your incision. Do not peel it off. It will come off on its own in about one week. Your incision may feel thickened and raised for several weeks after your surgery. This is normal and the skin will soften over time. For Men Only: It's OK to shave around the incision but do not shave the incision itself for 2 weeks. It is common to have numbness under your chin that could last for several months.  Diet  Resume your normal diet. There are no special food restrictions following this procedure. A low fat/low cholesterol diet is recommended for all patients with vascular disease. In order to heal from your surgery, it is CRITICAL to get adequate nutrition. Your body requires vitamins, minerals, and protein. Vegetables are the best  source of vitamins and minerals. Vegetables also provide the perfect balance of protein. Processed food has little nutritional value, so try to avoid this.        Medications  Resume taking all of your medications unless your doctor or physician assistant tells you not to. If your incision is causing pain, you may take over-the- counter pain relievers such as acetaminophen (Tylenol). If you were prescribed a stronger pain medication, please be aware these medications can cause nausea and constipation. Prevent nausea by taking the medication with a snack or meal. Avoid constipation by drinking plenty of fluids and eating foods with a high amount of fiber, such as fruits, vegetables, and grains. Do not take Tylenol if you are taking prescription pain medications.  Follow Up  Our office will schedule a follow up appointment 2-3 weeks following discharge.  Please call us immediately for any of the following conditions  Increased pain, redness, drainage (pus) from your incision site. Fever of 101 degrees or higher. If you should develop stroke (slurred speech, difficulty swallowing, weakness on one side of your body, loss of vision) you should call 911 and go to the nearest emergency room.  Reduce your risk of vascular disease:  Stop smoking. If you would like help call [facility] at 1-800-QUIT-NOW (1-800-784-8669) or [place] at [phone]. Manage your cholesterol Maintain a desired weight Control your diabetes Keep your blood pressure down  If you have any questions, please call the office at [phone].   

## 2017-08-22 NOTE — Progress Notes (Signed)
    Alert Smile symmetric, no tongue deviation Left neck incision without hematoma  S/P left CEA  Roxy Horseman PA-C

## 2017-08-22 NOTE — Anesthesia Procedure Notes (Signed)
Arterial Line Insertion Start/End1/10/2017 11:45 AM Performed by: White, Amedeo Plenty, Immunologist, CRNA  Preanesthetic checklist: patient identified, IV checked, site marked, risks and benefits discussed, surgical consent, monitors and equipment checked, pre-op evaluation and timeout performed Lidocaine 1% used for infiltration Left, radial was placed Catheter size: 20 G Hand hygiene performed  and maximum sterile barriers used   Attempts: 1 Procedure performed without using ultrasound guided technique. Following insertion, Biopatch and dressing applied. Post procedure assessment: normal  Patient tolerated the procedure well with no immediate complications.

## 2017-08-22 NOTE — Interval H&P Note (Signed)
History and Physical Interval Note:  08/22/2017 12:42 PM  Larry [name]  has presented today for surgery, with the diagnosis of LEFT CAROTID STENOSIS  The various methods of treatment have been discussed with the patient and family. After consideration of risks, benefits and other options for treatment, the patient has consented to  Procedure(s): ENDARTERECTOMY CAROTID LEFT (Left) as a surgical intervention .  The patient's history has been reviewed, patient examined, no change in status, stable for surgery.  I have reviewed the patient's chart and labs.  Questions were answered to the patient's satisfaction.     [name]

## 2017-08-22 NOTE — Anesthesia Procedure Notes (Signed)
Procedure Name: Intubation Date/Time: 08/22/2017 1:17 PM Performed by: White, Amedeo Plenty, CRNA Pre-anesthesia Checklist: Patient identified, Emergency Drugs available, Suction available and Patient being monitored Patient Re-evaluated:Patient Re-evaluated prior to induction Oxygen Delivery Method: Circle System Utilized Preoxygenation: Pre-oxygenation with 100% oxygen Induction Type: IV induction Ventilation: Mask ventilation without difficulty Laryngoscope Size: Mac and 4 Grade View: Grade II Tube type: Oral Tube size: 7.5 mm Number of attempts: 1 Airway Equipment and Method: Stylet Placement Confirmation: ETT inserted through vocal cords under direct vision,  positive ETCO2 and breath sounds checked- equal and bilateral Secured at: 24 cm Tube secured with: Tape Dental Injury: Teeth and Oropharynx as per pre-operative assessment

## 2017-08-23 ENCOUNTER — Encounter (HOSPITAL_COMMUNITY): Payer: Self-pay | Admitting: Surgery

## 2017-08-23 ENCOUNTER — Telehealth: Payer: Self-pay | Admitting: Surgery

## 2017-08-23 LAB — BASIC METABOLIC PANEL
Anion gap: 10 (ref 5–15)
BUN: 17 mg/dL (ref 6–20)
CO2: 23 mmol/L (ref 22–32)
CREATININE: 1.15 mg/dL (ref 0.61–1.24)
Calcium: 8.4 mg/dL — ABNORMAL LOW (ref 8.9–10.3)
Chloride: 105 mmol/L (ref 101–111)
GFR calc Af Amer: >60 mL/min (ref >60)
Glucose, Bld: 242 mg/dL — ABNORMAL HIGH (ref 65–99)
Potassium: 4 mmol/L (ref 3.5–5.1)
SODIUM: 138 mmol/L (ref 135–145)

## 2017-08-23 LAB — CBC
HEMATOCRIT: 36.5 % — AB (ref 39.0–52.0)
Hemoglobin: 11.1 g/dL — ABNORMAL LOW (ref 13.0–17.0)
MCH: 27.5 pg (ref 26.0–34.0)
MCHC: 30.4 g/dL (ref 30.0–36.0)
MCV: 90.3 fL (ref 78.0–100.0)
PLATELETS: 278 10*3/uL (ref 150–400)
RBC: 4.04 MIL/uL — AB (ref 4.22–5.81)
RDW: 13.4 % (ref 11.5–15.5)
WBC: 9.4 10*3/uL (ref 4.0–10.5)

## 2017-08-23 LAB — POCT ACTIVATED CLOTTING TIME
ACTIVATED CLOTTING TIME: 235 s
ACTIVATED CLOTTING TIME: 263 s
Activated Clotting Time: 252 seconds

## 2017-08-23 MED ORDER — ENOXAPARIN SODIUM 40 MG/0.4ML ~~LOC~~ SOLN
40.0000 mg | SUBCUTANEOUS | Status: DC
  Administered 2017-08-23: 40 mg via SUBCUTANEOUS
  Filled 2017-08-23: qty 0.4

## 2017-08-23 MED ORDER — LABETALOL HCL 5 MG/ML IV SOLN
10.0000 mg | Freq: Once | INTRAVENOUS | Status: AC
  Administered 2017-08-23: 10 mg via INTRAVENOUS
  Filled 2017-08-23: qty 4

## 2017-08-23 MED ORDER — OXYCODONE-ACETAMINOPHEN 5-325 MG PO TABS: 1.0000 | ORAL_TABLET | Freq: Four times a day (QID) | ORAL | 0 refills | Status: DC | PRN

## 2017-08-23 NOTE — Progress Notes (Signed)
Removed A Line from patient. Held pressure for 6 minutes, patient tolerated removal well. Cath. Tip was intact when A Line removed

## 2017-08-23 NOTE — Telephone Encounter (Signed)
Sched appt 09/16/17 at 2:45. Spoke to pt.

## 2017-08-23 NOTE — Progress Notes (Signed)
   VASCULAR SURGERY ASSESSMENT & PLAN:   1 Day Post-Op s/p: Left carotid endarterectomy.  Doing well.  Home today.   SUBJECTIVE:   No complaints.  PHYSICAL EXAM:   Vitals:   08/22/17 2026 08/23/17 0000 08/23/17 0420 08/23/17 0725  BP: (!) 172/83 (!) 165/83 (!) 153/81 (!) 148/70  Pulse: 83  81   Resp: 15 11 16 14   Temp:   (!) 97.4 F (36.3 C)   TempSrc:   Oral   SpO2: 98% 99% 98% 98%  Weight:      Height:       Mild swelling left neck. The left neck incision looks fine. NEURO: No focal weakness.  LABS:   Lab Results  Component Value Date   WBC 9.4 08/23/2017   HGB 11.1 (L) 08/23/2017   HCT 36.5 (L) 08/23/2017   MCV 90.3 08/23/2017   PLT 278 08/23/2017   Lab Results  Component Value Date   CREATININE 1.15 08/23/2017   Lab Results  Component Value Date   INR 1.01 08/19/2017   CBG (last 3)  Recent Labs    08/22/17 0912 08/22/17 1118 08/22/17 1554  GLUCAP 166* 115* 144*    PROBLEM LIST:    Active Problems:   Carotid stenosis   CURRENT MEDS:   . aspirin  325 mg Oral Daily  . cholecalciferol  1,000 Units Oral Daily  . docusate sodium  100 mg Oral Daily  . enoxaparin (LOVENOX) injection  30 mg Subcutaneous Q24H  . glimepiride  2 mg Oral Q breakfast  . losartan  100 mg Oral QPM  . metFORMIN  1,000 mg Oral BID WC  . metoprolol tartrate  50 mg Oral BID  . omega-3 acid ethyl esters  1,000 mg Oral Daily  . pantoprazole  40 mg Oral Daily  . potassium chloride SA  20 mEq Oral Daily  . simvastatin  40 mg Oral QHS    Deitra Mayo Beeper: [phone] Office: [phone] 08/23/2017

## 2017-08-23 NOTE — Care Management Note (Signed)
Case Management Note Marvetta Gibbons RN, BSN Unit 4E-Case Manager [phone]  Patient Details  Name: Larry [name] MRN: [id number] Date of Birth: [birth year]  Subjective/Objective:   Pt admitted s/p CEA                 Action/Plan: PTA pt lived at home - plan to return home- notified by tiffany with [facility] that they have a pre-op referral for any HH needs- No CM needs noted for transition home.   Expected Discharge Date:  08/23/17               Expected Discharge Plan:  Home/Self Care  In-House Referral:  NA  Discharge planning Services  CM Consult  Post Acute Care Choice:  NA Choice offered to:     DME Arranged:    DME Agency:     HH Arranged:    HH Agency:  [facility]  Status of Service:  Completed, signed off  If discussed at [place] of Stay Meetings, dates discussed:    Discharge Disposition: home/self care  Additional Comments:  [name], RN 08/23/2017, 10:57 AM

## 2017-08-23 NOTE — Progress Notes (Signed)
Patient is ready for discharge. Patient has all of his belongings with him. Patient understands and has had all questions regarding his discharge instructions answered. Patient is alert and oriented and VS are stable. Patient IV has been DC'd and he tolerated it well. Telemetry and been called and patient has been DC'd from telemetry. Patient will leave unit by wheelchair and meet his daughter at the front of the hospital.

## 2017-08-23 NOTE — Anesthesia Postprocedure Evaluation (Signed)
Anesthesia Post Note  Patient: Larry [name]  Procedure(s) Performed: ENDARTERECTOMY CAROTID LEFT (Left Neck) PATCH ANGIOPLASTY USING XENOSURE BIOLOGIC PATCH (Left Neck)     Patient location during evaluation: PACU Anesthesia Type: General Level of consciousness: awake and alert Pain management: pain level controlled Vital Signs Assessment: post-procedure vital signs reviewed and stable Respiratory status: spontaneous breathing, nonlabored ventilation, respiratory function stable and patient connected to nasal cannula oxygen Cardiovascular status: blood pressure returned to baseline and stable Postop Assessment: no apparent nausea or vomiting Anesthetic complications: no    Last Vitals:  Vitals:   08/23/17 0725 08/23/17 1213  BP: (!) 148/70 (!) 144/84  Pulse:  99  Resp: 14 15  Temp:  36.4 C  SpO2: 98% 97%    Last Pain:  Vitals:   08/23/17 1213  TempSrc: Oral  PainSc:                  [name]

## 2017-08-23 NOTE — Telephone Encounter (Signed)
-----   Message from Mena Goes, RN sent at 08/23/2017 10:47 AM EST ----- Regarding: 2-3 weeks    ----- Message ----- From: Ulyses Amor, PA-C Sent: 08/22/2017   4:01 PM To: Vvs Charge Pool  S/P left CEA f/u with Dr. Trula Slade 2-3 weeks

## 2017-08-23 NOTE — Discharge Summary (Signed)
Discharge Summary     Larry [name] May 26, 1941 77 y.o. male  [id number]  Admission Date: 08/22/2017  Discharge Date: 08/23/17  Physician: Serafina Mitchell, MD  Admission Diagnosis: LEFT CAROTID STENOSIS  Discharge Day services:    Patient is feeling fit for discharge.  He denies stroke like symptoms including slurring speech, changes in vision, or one sided weakness. Physical Exam: Vitals:   08/23/17 0420 08/23/17 0725  BP: (!) 153/81 (!) 148/70  Pulse: 81   Resp: 16 14  Temp: (!) 97.4 F (36.3 C)   SpO2: 98% 98%   General: NAD Cardiac:  RRR Lungs:  Non-labored Incisions:  L neck incision with local edema, soft, no palpable hematoma; trachea midline Extremities:  Moving all extremities well Abdomen:  Soft Neurologic: CN grossly intact; mild left lip weakness  Hospital Course:  The patient was admitted to the hospital and taken to the operating room on 08/22/2017 and underwent left carotid endarterectomy.  The pt tolerated the procedure well and was transported to the PACU in fair condition.   By POD 1, the pt neuro status remained stable compared to pre-operative assessment.  The remainder of the hospital course consisted of increasing mobilization and increasing intake of solids without difficulty.  He will be prescribed #8 5/'325mg'$  percocet for continued post operative pain control.  He will follow up with Dr. Trula Slade in about 2-3 weeks.  Discharge instructions were reviewed with the patient and he and his wife voice their understanding.  He will be discharged this morning if ambulating without difficulty and tolerating breakfast in stable condition to home.   Recent Labs    08/23/17 0234  NA 138  K 4.0  CL 105  CO2 23  GLUCOSE 242*  BUN 17  CALCIUM 8.4*   Recent Labs    08/22/17 1800 08/23/17 0234  WBC 13.0* 9.4  HGB 12.0* 11.1*  HCT 38.9* 36.5*  PLT 256 278   No results for input(s): INR in the last 72 hours.     Discharge Diagnosis:  LEFT  CAROTID STENOSIS  Secondary Diagnosis: Patient Active Problem List   Diagnosis Date Noted  . Carotid stenosis 08/22/2017  . 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 CAD in native artery 06/24/2017  . Midsternal chest pain 06/18/2017  . DOE (dyspnea on exertion) 06/18/2017  . Migraine with aura 11/21/2016  . Cramp and spasm 04/06/2016  . Sciatic radiculitis 03/23/2016  . CAP (community acquired pneumonia) 08/26/2015  . Cough 08/23/2015  . Wheezing 08/23/2015  . Bruising 09/01/2013  . Sinusitis 08/27/2012  . Cerumen impaction 08/27/2012  . Edema 04/23/2012  . Well adult exam 07/13/2011  . OBSTRUCTIVE SLEEP APNEA 10/23/2010  . LOW BACK PAIN, ACUTE 07/19/2009  . DM2 (diabetes mellitus, type 2) ([place]) 06/24/2007  . Dyslipidemia 06/24/2007  . Essential hypertension 06/24/2007  . PROSTATE CANCER, HX OF 06/24/2007   Past Medical History:  Diagnosis Date  . Carotid stenosis, left   . Cataracts, bilateral    removed  . Coronary artery disease   . Hyperlipemia   . Prostate CA ([place])   . Sleep apnea    Dr Ledora Bottcher ; uses CPAP  . Type II or unspecified type diabetes mellitus without mention of complication, not stated as uncontrolled   . Unspecified essential hypertension     Allergies as of 08/23/2017      Reactions   Amlodipine Swelling   Leg swelling   Verapamil Swelling   Edema w/high dose   Clindamycin Hcl  UNSPECIFIED REACTION    Contrast Media [iodinated Diagnostic Agents] Nausea And Vomiting      Medication List    TAKE these medications   ACCU-CHEK FASTCLIX LANCETS Misc 1 each by Does not apply route 2 (two) times daily. Dx:E11.9   aspirin 325 MG EC tablet Take 1 tablet (325 mg total) daily by mouth.   diphenhydrAMINE 50 MG capsule Commonly known as:  BENADRYL Take 50 mg by mouth 1 hour prior to your procedure.   furosemide 40 MG tablet Commonly known as:  LASIX TAKE 1 TABLET BY MOUTH DAILY AS NEEDED   glimepiride 2 MG tablet Commonly known as:  AMARYL Take 1 tablet  (2 mg total) by mouth 2 (two) times daily. What changed:  when to take this   glucose blood test strip 1 each by Other route 2 (two) times daily. Please dispense brand of choice per pt and insurance preference. Dx:250.00   glucose blood test strip Commonly known as:  ACCU-CHEK SMARTVIEW 1 each by Other route 2 (two) times daily. Use to check blood sugars twice a day Dx E11.9   glucose monitoring kit monitoring kit 1 each by Does not apply route as needed for other. Please dispense brand of choice per ins/patient. Dx: 250.00.   losartan 100 MG tablet Commonly known as:  COZAAR Take 1 tablet (100 mg total) by mouth daily. What changed:  when to take this   metFORMIN 1000 MG tablet Commonly known as:  GLUCOPHAGE Take 1 tablet (1,000 mg total) by mouth 2 (two) times daily with a meal.   metoprolol tartrate 50 MG tablet Commonly known as:  LOPRESSOR Take 1 tablet (50 mg total) 2 (two) times daily by mouth.   nitroGLYCERIN 0.4 MG SL tablet Commonly known as:  NITROSTAT Place 1 tablet (0.4 mg total) under the tongue every 5 (five) minutes as needed for chest pain.   Omega-3 Fish Oil 300 MG Caps Take 300 mg by mouth daily.   oxyCODONE-acetaminophen 5-325 MG tablet Commonly known as:  PERCOCET/ROXICET Take 1 tablet by mouth every 6 (six) hours as needed for moderate pain.   potassium chloride SA 20 MEQ tablet Commonly known as:  K-DUR,KLOR-CON Take 1 tablet (20 mEq total) daily by mouth. What changed:    when to take this  reasons to take this  additional instructions   predniSONE 50 MG tablet Commonly known as:  DELTASONE One tablet ('50mg'$ ) 13 hours prior to procedure; one tablet ('50mg'$ ) 7 hours prior to procedure and then one tablet (50 mg) one hour prior to procedure.   simvastatin 40 MG tablet Commonly known as:  ZOCOR Take 1 tablet (40 mg total) by mouth at bedtime.   tamsulosin 0.4 MG Caps capsule Commonly known as:  FLOMAX Take 1 capsule (0.4 mg total) daily by  mouth.   traMADol 50 MG tablet Commonly known as:  ULTRAM Take 1-2 tablets (50-100 mg total) every 4 (four) hours as needed by mouth for moderate pain.   vitamin C 1000 MG tablet Take 1,000 mg by mouth daily.   Vitamin D3 1000 units Caps Take 1,000 Units by mouth daily.        Discharge Instructions:   [facility] Discharge Instructions Carotid Endarterectomy (CEA)  Please refer to the following instructions for your post-procedure care. Your surgeon or physician assistant will discuss any changes with you.  Activity  You are encouraged to walk as much as you can. You can slowly return to normal activities  but must avoid strenuous activity and heavy lifting until your doctor tell you it's OK. Avoid activities such as vacuuming or swinging a golf club. You can drive after one week if you are comfortable and you are no longer taking prescription pain medications. It is normal to feel tired for serval weeks after your surgery. It is also normal to have difficulty with sleep habits, eating, and bowel movements after surgery. These will go away with time.  Bathing/Showering  You may shower after you come home. Do not soak in a bathtub, hot tub, or swim until the incision heals completely.  Incision Care  Shower every day. Clean your incision with mild soap and water. Pat the area dry with a clean towel. You do not need a bandage unless otherwise instructed. Do not apply any ointments or creams to your incision. You may have skin glue on your incision. Do not peel it off. It will come off on its own in about one week. Your incision may feel thickened and raised for several weeks after your surgery. This is normal and the skin will soften over time. For Men Only: It's OK to shave around the incision but do not shave the incision itself for 2 weeks. It is common to have numbness under your chin that could last for several months.  Diet  Resume your normal  diet. There are no special food restrictions following this procedure. A low fat/low cholesterol diet is recommended for all patients with vascular disease. In order to heal from your surgery, it is CRITICAL to get adequate nutrition. Your body requires vitamins, minerals, and protein. Vegetables are the best source of vitamins and minerals. Vegetables also provide the perfect balance of protein. Processed food has little nutritional value, so try to avoid this.  Medications  Resume taking all of your medications unless your doctor or physician assistant tells you not to.  If your incision is causing pain, you may take over-the- counter pain relievers such as acetaminophen (Tylenol). If you were prescribed a stronger pain medication, please be aware these medications can cause nausea and constipation.  Prevent nausea by taking the medication with a snack or meal. Avoid constipation by drinking plenty of fluids and eating foods with a high amount of fiber, such as fruits, vegetables, and grains. Do not take Tylenol if you are taking prescription pain medications.  Follow Up  Our office will schedule a follow up appointment 2-3 weeks following discharge.  Please call us immediately for any of the following conditions  Increased pain, redness, drainage (pus) from your incision site. Fever of 101 degrees or higher. If you should develop stroke (slurred speech, difficulty swallowing, weakness on one side of your body, loss of vision) you should call 911 and go to the nearest emergency room.  Reduce your risk of vascular disease:  Stop smoking. If you would like help call [facility] at 1-800-QUIT-NOW 929-230-7220) or [place] at 4584188452. Manage your cholesterol Maintain a desired weight Control your diabetes Keep your blood pressure down  If you have any questions, please call the office at [phone].  Prescriptions given: 5/'325mg'$  percocet #8 No Refill  Disposition:  home  Patient's condition: is Good  Follow up: 1. Dr. Trula Slade in 2 weeks.   Dagoberto Ligas, PA-C [facility] [phone]   --- For [facility] Registry use ---   Modified Rankin score at D/C (0-6): 0  IV medication needed for:  1. Hypertension: Yes 2. Hypotension: No  Post-op Complications: No  1. Post-op CVA or TIA: No  If yes: Event classification (right eye, left eye, right cortical, left cortical, verterobasilar, other):   If yes: Timing of event (intra-op, <6 hrs post-op, >=6 hrs post-op, unknown):   2. CN injury: No  3. Myocardial infarction: No  If yes: Dx by (EKG or clinical, Troponin):   4.  CHF: No  5.  Dysrhythmia (new): No  6. Wound infection: No  7. Reperfusion symptoms: No  8. Return to OR: No  If yes: return to OR for (bleeding, neurologic, other CEA incision, other):   Discharge medications: Statin use:  Yes ASA use:  Yes   Beta blocker use:  Yes ACE-Inhibitor use:  No  ARB use:  Yes CCB use: No P2Y12 Antagonist use: No, '[ ]'$  Plavix, '[ ]'$  Plasugrel, '[ ]'$  Ticlopinine, '[ ]'$  Ticagrelor, '[ ]'$  Other, '[ ]'$  No for medical reason, '[ ]'$  Non-compliant, '[ ]'$  Not-indicated Anti-coagulant use:  No, '[ ]'$  Warfarin, '[ ]'$  Rivaroxaban, '[ ]'$  Dabigatran,

## 2017-08-24 DIAGNOSIS — Z951 Presence of aortocoronary bypass graft: Secondary | ICD-10-CM | POA: Diagnosis not present

## 2017-08-24 DIAGNOSIS — E785 Hyperlipidemia, unspecified: Secondary | ICD-10-CM | POA: Diagnosis not present

## 2017-08-24 DIAGNOSIS — Z7984 Long term (current) use of oral hypoglycemic drugs: Secondary | ICD-10-CM | POA: Diagnosis not present

## 2017-08-24 DIAGNOSIS — I10 Essential (primary) hypertension: Secondary | ICD-10-CM | POA: Diagnosis not present

## 2017-08-24 DIAGNOSIS — E119 Type 2 diabetes mellitus without complications: Secondary | ICD-10-CM | POA: Diagnosis not present

## 2017-08-24 DIAGNOSIS — I6522 Occlusion and stenosis of left carotid artery: Secondary | ICD-10-CM | POA: Diagnosis not present

## 2017-08-25 ENCOUNTER — Emergency Department (HOSPITAL_BASED_OUTPATIENT_CLINIC_OR_DEPARTMENT_OTHER): Payer: Medicare Other

## 2017-08-25 ENCOUNTER — Other Ambulatory Visit: Payer: Self-pay

## 2017-08-25 ENCOUNTER — Encounter (HOSPITAL_BASED_OUTPATIENT_CLINIC_OR_DEPARTMENT_OTHER): Payer: Self-pay | Admitting: Emergency Medicine

## 2017-08-25 ENCOUNTER — Inpatient Hospital Stay (HOSPITAL_BASED_OUTPATIENT_CLINIC_OR_DEPARTMENT_OTHER)
Admission: EM | Admit: 2017-08-25 | Discharge: 2017-08-27 | DRG: 293 | Disposition: A | Discharge status: Home / Self Care | Payer: Medicare Other | Attending: Family Medicine | Admitting: Family Medicine

## 2017-08-25 DIAGNOSIS — I5023 Acute on chronic systolic (congestive) heart failure: Secondary | ICD-10-CM | POA: Diagnosis present

## 2017-08-25 DIAGNOSIS — Z881 Allergy status to other antibiotic agents status: Secondary | ICD-10-CM | POA: Diagnosis not present

## 2017-08-25 DIAGNOSIS — I5021 Acute systolic (congestive) heart failure: Secondary | ICD-10-CM | POA: Diagnosis not present

## 2017-08-25 DIAGNOSIS — Z7982 Long term (current) use of aspirin: Secondary | ICD-10-CM | POA: Diagnosis not present

## 2017-08-25 DIAGNOSIS — E662 Morbid (severe) obesity with alveolar hypoventilation: Secondary | ICD-10-CM

## 2017-08-25 DIAGNOSIS — Z79899 Other long term (current) drug therapy: Secondary | ICD-10-CM | POA: Diagnosis not present

## 2017-08-25 DIAGNOSIS — I5041 Acute combined systolic (congestive) and diastolic (congestive) heart failure: Secondary | ICD-10-CM | POA: Diagnosis present

## 2017-08-25 DIAGNOSIS — D649 Anemia, unspecified: Secondary | ICD-10-CM | POA: Diagnosis present

## 2017-08-25 DIAGNOSIS — Z951 Presence of aortocoronary bypass graft: Secondary | ICD-10-CM

## 2017-08-25 DIAGNOSIS — E669 Obesity, unspecified: Secondary | ICD-10-CM | POA: Diagnosis present

## 2017-08-25 DIAGNOSIS — G4730 Sleep apnea, unspecified: Secondary | ICD-10-CM | POA: Diagnosis present

## 2017-08-25 DIAGNOSIS — E1169 Type 2 diabetes mellitus with other specified complication: Secondary | ICD-10-CM

## 2017-08-25 DIAGNOSIS — Z888 Allergy status to other drugs, medicaments and biological substances status: Secondary | ICD-10-CM

## 2017-08-25 DIAGNOSIS — E785 Hyperlipidemia, unspecified: Secondary | ICD-10-CM | POA: Diagnosis not present

## 2017-08-25 DIAGNOSIS — I2510 Atherosclerotic heart disease of native coronary artery without angina pectoris: Secondary | ICD-10-CM | POA: Diagnosis present

## 2017-08-25 DIAGNOSIS — Z91041 Radiographic dye allergy status: Secondary | ICD-10-CM

## 2017-08-25 DIAGNOSIS — Z8546 Personal history of malignant neoplasm of prostate: Secondary | ICD-10-CM

## 2017-08-25 DIAGNOSIS — I110 Hypertensive heart disease with heart failure: Secondary | ICD-10-CM | POA: Diagnosis present

## 2017-08-25 DIAGNOSIS — R0602 Shortness of breath: Secondary | ICD-10-CM | POA: Diagnosis not present

## 2017-08-25 DIAGNOSIS — N400 Enlarged prostate without lower urinary tract symptoms: Secondary | ICD-10-CM | POA: Diagnosis present

## 2017-08-25 DIAGNOSIS — E119 Type 2 diabetes mellitus without complications: Secondary | ICD-10-CM | POA: Diagnosis present

## 2017-08-25 DIAGNOSIS — Z7984 Long term (current) use of oral hypoglycemic drugs: Secondary | ICD-10-CM

## 2017-08-25 DIAGNOSIS — Z6833 Body mass index (BMI) 33.0-33.9, adult: Secondary | ICD-10-CM | POA: Diagnosis not present

## 2017-08-25 DIAGNOSIS — I5042 Chronic combined systolic (congestive) and diastolic (congestive) heart failure: Secondary | ICD-10-CM

## 2017-08-25 LAB — CBC WITH DIFFERENTIAL/PLATELET
BASOS PCT: 1 %
Basophils Absolute: 0.1 10*3/uL (ref 0.0–0.1)
EOS ABS: 0.2 10*3/uL (ref 0.0–0.7)
Eosinophils Relative: 1 %
HEMATOCRIT: 36 % — AB (ref 39.0–52.0)
Hemoglobin: 11.4 g/dL — ABNORMAL LOW (ref 13.0–17.0)
Lymphocytes Relative: 26 %
Lymphs Abs: 2.9 10*3/uL (ref 0.7–4.0)
MCH: 28.7 pg (ref 26.0–34.0)
MCHC: 31.7 g/dL (ref 30.0–36.0)
MCV: 90.7 fL (ref 78.0–100.0)
MONO ABS: 1 10*3/uL (ref 0.1–1.0)
MONOS PCT: 10 %
NEUTROS ABS: 6.8 10*3/uL (ref 1.7–7.7)
Neutrophils Relative %: 62 %
Platelets: 293 10*3/uL (ref 150–400)
RBC: 3.97 MIL/uL — ABNORMAL LOW (ref 4.22–5.81)
RDW: 13.7 % (ref 11.5–15.5)
WBC: 11 10*3/uL — ABNORMAL HIGH (ref 4.0–10.5)

## 2017-08-25 LAB — COMPREHENSIVE METABOLIC PANEL
ALBUMIN: 3.6 g/dL (ref 3.5–5.0)
ALT: 13 U/L — ABNORMAL LOW (ref 17–63)
AST: 21 U/L (ref 15–41)
Alkaline Phosphatase: 71 U/L (ref 38–126)
Anion gap: 9 (ref 5–15)
BILIRUBIN TOTAL: 0.7 mg/dL (ref 0.3–1.2)
BUN: 20 mg/dL (ref 6–20)
CO2: 24 mmol/L (ref 22–32)
Calcium: 9 mg/dL (ref 8.9–10.3)
Chloride: 104 mmol/L (ref 101–111)
Creatinine, Ser: 1.17 mg/dL (ref 0.61–1.24)
GFR calc Af Amer: >60 mL/min (ref >60)
GFR calc non Af Amer: 59 mL/min — ABNORMAL LOW (ref >60)
GLUCOSE: 209 mg/dL — AB (ref 65–99)
Potassium: 3.8 mmol/L (ref 3.5–5.1)
Sodium: 137 mmol/L (ref 135–145)
TOTAL PROTEIN: 6.4 g/dL — AB (ref 6.5–8.1)

## 2017-08-25 LAB — TROPONIN I: Troponin I: 0.03 ng/mL (ref <0.03)

## 2017-08-25 LAB — BRAIN NATRIURETIC PEPTIDE: B NATRIURETIC PEPTIDE 5: 2945.1 pg/mL — AB (ref 0.0–100.0)

## 2017-08-25 MED ORDER — TAMSULOSIN HCL 0.4 MG PO CAPS: 0.4000 mg | ORAL_CAPSULE | Freq: Every day | ORAL | Status: DC

## 2017-08-25 MED ORDER — VITAMIN D3 25 MCG (1000 UNIT) PO TABS
1000.0000 [IU] | ORAL_TABLET | Freq: Every day | ORAL | Status: DC
  Administered 2017-08-26 – 2017-08-27 (×2): 1000 [IU] via ORAL
  Filled 2017-08-25 (×2): qty 1

## 2017-08-25 MED ORDER — INSULIN ASPART 100 UNIT/ML ~~LOC~~ SOLN
0.0000 [IU] | Freq: Three times a day (TID) | SUBCUTANEOUS | Status: DC
  Administered 2017-08-26 – 2017-08-27 (×3): 2 [IU] via SUBCUTANEOUS

## 2017-08-25 MED ORDER — SIMVASTATIN 40 MG PO TABS
40.0000 mg | ORAL_TABLET | Freq: Every day | ORAL | Status: DC
  Administered 2017-08-25 – 2017-08-26 (×2): 40 mg via ORAL
  Filled 2017-08-25 (×2): qty 1

## 2017-08-25 MED ORDER — LOSARTAN POTASSIUM 50 MG PO TABS
100.0000 mg | ORAL_TABLET | Freq: Every day | ORAL | Status: DC
  Administered 2017-08-25 – 2017-08-27 (×3): 100 mg via ORAL
  Filled 2017-08-25 (×3): qty 2

## 2017-08-25 MED ORDER — FUROSEMIDE 10 MG/ML IJ SOLN
40.0000 mg | Freq: Once | INTRAMUSCULAR | Status: AC
  Administered 2017-08-25: 40 mg via INTRAVENOUS
  Filled 2017-08-25: qty 4

## 2017-08-25 MED ORDER — ASPIRIN EC 325 MG PO TBEC
325.0000 mg | DELAYED_RELEASE_TABLET | Freq: Every day | ORAL | Status: DC
  Administered 2017-08-26 – 2017-08-27 (×2): 325 mg via ORAL
  Filled 2017-08-25 (×2): qty 1

## 2017-08-25 MED ORDER — FUROSEMIDE 10 MG/ML IJ SOLN
40.0000 mg | Freq: Two times a day (BID) | INTRAMUSCULAR | Status: DC
  Administered 2017-08-25 – 2017-08-26 (×2): 40 mg via INTRAVENOUS
  Filled 2017-08-25 (×2): qty 4

## 2017-08-25 MED ORDER — METOPROLOL TARTRATE 50 MG PO TABS
50.0000 mg | ORAL_TABLET | Freq: Two times a day (BID) | ORAL | Status: DC
  Administered 2017-08-25 – 2017-08-27 (×4): 50 mg via ORAL
  Filled 2017-08-25 (×4): qty 1

## 2017-08-25 NOTE — ED Provider Notes (Addendum)
[place] EMERGENCY DEPARTMENT Provider Note   CSN: [id number] Arrival date & time: 08/25/17  0955     History   Chief Complaint Chief Complaint  Patient presents with  . Shortness of Breath    HPI Larry [name] is a 77 y.o. male.  HPI 77 year old male with history of coronary artery disease status post CABG 2 months ago, CHF with EF of 45%, diabetes, carotid artery disease status post endarterectomy 1 week ago who comes into the ER with chief complaint of shortness of breath.  Patient reports that he started appreciating some shortness of breath after being discharged from the hospital last week.  Patient specifically noted that his shortness of breath was worse with laying flat.  Over time patient has noted that he is having shortness of breath with exertion.  Today he was able to walk only half the distance that he normally walks, when he started getting winded.  Patient has no associated chest pain.  Patient does have pitting edema in his legs, however he reports that he has had intermittent swelling in his leg which responds to Lasix.  Review of system is also positive for cough with hemoptysis. Pt has no hx of PE, DVT.  Past Medical History:  Diagnosis Date  . Carotid stenosis, left   . Cataracts, bilateral    removed  . Coronary artery disease   . Hyperlipemia   . Prostate CA ([employer])   . Sleep apnea    Dr Ledora Bottcher ; uses CPAP  . Type II or unspecified type diabetes mellitus without mention of complication, not stated as uncontrolled   . Unspecified essential hypertension     Patient Active Problem List   Diagnosis Date Noted  . Carotid stenosis 08/22/2017  . 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 CAD in native artery 06/24/2017  . Midsternal chest pain 06/18/2017  . DOE (dyspnea on exertion) 06/18/2017  . Migraine with aura 11/21/2016  . Cramp and spasm 04/06/2016  . Sciatic radiculitis 03/23/2016  . CAP (community acquired pneumonia) 08/26/2015  . Cough  08/23/2015  . Wheezing 08/23/2015  . Bruising 09/01/2013  . Sinusitis 08/27/2012  . Cerumen impaction 08/27/2012  . Edema 04/23/2012  . Well adult exam 07/13/2011  . OBSTRUCTIVE SLEEP APNEA 10/23/2010  . LOW BACK PAIN, ACUTE 07/19/2009  . DM2 (diabetes mellitus, type 2) ([place]) 06/24/2007  . Dyslipidemia 06/24/2007  . Essential hypertension 06/24/2007  . PROSTATE CANCER, HX OF 06/24/2007    Past Surgical History:  Procedure Laterality Date  . APPENDECTOMY    . CATARACT EXTRACTION, BILATERAL    . CORONARY ARTERY BYPASS GRAFT N/A 06/27/2017   Procedure: CORONARY ARTERY BYPASS GRAFTING (CABG), time four   using the left internal mammary artery, and right saphenous leg vein harvested endoscopically.;  Surgeon: Ivin Poot, MD;  Location: [place];  Service: Open Heart Surgery;  Laterality: N/A;  . CYSTOSCOPY N/A 06/27/2017   Procedure: CYSTOSCOPY FLEXIBLE, Balloon dilatation placement of foley catheter;  Surgeon: Ivin Poot, MD;  Location: [place];  Service: Open Heart Surgery;  Laterality: N/A;  . ENDARTERECTOMY Left 08/22/2017   Procedure: ENDARTERECTOMY CAROTID LEFT;  Surgeon: Serafina Mitchell, MD;  Location: [place];  Service: Vascular;  Laterality: Left;  . INTRAVASCULAR ULTRASOUND/IVUS N/A 06/24/2017   Procedure: Intravascular Ultrasound/IVUS;  Surgeon: Belva Crome, MD;  Location: [place] CV [facility];  Service: Cardiovascular;  Laterality: N/A;  LEFT MAIN  . LEFT HEART CATH AND CORONARY ANGIOGRAPHY N/A 06/24/2017  Procedure: LEFT HEART CATH AND CORONARY ANGIOGRAPHY;  Surgeon: Belva Crome, MD;  Location: [place] CV [facility];  Service: Cardiovascular;  Laterality: N/A;  . PATCH ANGIOPLASTY Left 08/22/2017   Procedure: PATCH ANGIOPLASTY USING Rueben Bash BIOLOGIC PATCH;  Surgeon: Serafina Mitchell, MD;  Location: [place];  Service: Vascular;  Laterality: Left;  . PROSTATECTOMY    . TEE WITHOUT CARDIOVERSION N/A 06/27/2017   Procedure: TRANSESOPHAGEAL ECHOCARDIOGRAM (TEE);  Surgeon: Prescott Gum,  Collier Salina, MD;  Location: [place];  Service: Open Heart Surgery;  Laterality: N/A;  . TONSILLECTOMY    . ULTRASOUND GUIDANCE FOR VASCULAR ACCESS  06/24/2017   Procedure: Ultrasound Guidance For Vascular Access;  Surgeon: Belva Crome, MD;  Location: [place] [facility];  Service: Cardiovascular;;       Home Medications    Prior to Admission medications   Medication Sig Start Date End Date Taking? Authorizing Provider  ACCU-CHEK FASTCLIX LANCETS MISC 1 each by Does not apply route 2 (two) times daily. Dx:E11.9 10/25/15   Plotnikov, [name], MD  Ascorbic Acid (VITAMIN C) 1000 MG tablet Take 1,000 mg by mouth daily.    [provider]  aspirin EC 325 MG EC tablet Take 1 tablet (325 mg total) daily by mouth. 07/03/17   Barrett, Erin R, PA-C  Cholecalciferol (VITAMIN D3) 1000 UNITS CAPS Take 1,000 Units by mouth daily.     [provider]  diphenhydrAMINE (BENADRYL) 50 MG capsule Take 50 mg by mouth 1 hour prior to your procedure. Patient not taking: Reported on 08/15/2017 07/08/17   Serafina Mitchell, MD  furosemide (LASIX) 40 MG tablet TAKE 1 TABLET BY MOUTH DAILY AS NEEDED 07/31/17   Plotnikov, [name], MD  glimepiride (AMARYL) 2 MG tablet Take 1 tablet (2 mg total) by mouth 2 (two) times daily. Patient taking differently: Take 2 mg by mouth daily with breakfast.  11/21/16   Plotnikov, [name], MD  glucose blood (ACCU-CHEK SMARTVIEW) test strip 1 each by Other route 2 (two) times daily. Use to check blood sugars twice a day Dx E11.9 11/21/16   Plotnikov, [name], MD  glucose blood test strip 1 each by Other route 2 (two) times daily. Please dispense brand of choice per pt and insurance preference. Dx:250.00 05/08/13   Plotnikov, [name], MD  glucose monitoring kit (FREESTYLE) monitoring kit 1 each by Does not apply route as needed for other. Please dispense brand of choice per ins/patient. Dx: 250.00. 05/08/13   Plotnikov, [name], MD  losartan (COZAAR) 100 MG tablet Take 1  tablet (100 mg total) by mouth daily. Patient taking differently: Take 100 mg by mouth every evening.  08/06/17 11/04/17  Burtis Junes, NP  metFORMIN (GLUCOPHAGE) 1000 MG tablet Take 1 tablet (1,000 mg total) by mouth 2 (two) times daily with a meal. 08/07/16   Plotnikov, [name], MD  metoprolol tartrate (LOPRESSOR) 50 MG tablet Take 1 tablet (50 mg total) 2 (two) times daily by mouth. 07/03/17   Barrett, Erin R, PA-C  nitroGLYCERIN (NITROSTAT) 0.4 MG SL tablet Place 1 tablet (0.4 mg total) under the tongue every 5 (five) minutes as needed for chest pain. 06/18/17   Plotnikov, [name], MD  Omega-3 Fatty Acids (OMEGA-3 FISH OIL) 300 MG CAPS Take 300 mg by mouth daily.    [provider]  oxyCODONE-acetaminophen (PERCOCET/ROXICET) 5-325 MG tablet Take 1 tablet by mouth every 6 (six) hours as needed for moderate pain. 08/23/17   Dagoberto Ligas, PA-C  potassium  chloride SA (K-DUR,KLOR-CON) 20 MEQ tablet Take 1 tablet (20 mEq total) daily by mouth. Patient taking differently: Take 20 mEq by mouth daily as needed (swelling). Take with Lasix 07/03/17   Barrett, Erin R, PA-C  predniSONE (DELTASONE) 50 MG tablet One tablet (34m) 13 hours prior to procedure; one tablet (558m 7 hours prior to procedure and then one tablet (50 mg) one hour prior to procedure. Patient not taking: Reported on 08/15/2017 07/08/17   BrSerafina MitchellMD  simvastatin (ZOCOR) 40 MG tablet Take 1 tablet (40 mg total) by mouth at bedtime. 08/07/16   Plotnikov, AlEvie LacksMD  tamsulosin (FLOMAX) 0.4 MG CAPS capsule Take 1 capsule (0.4 mg total) daily by mouth. Patient not taking: Reported on 08/15/2017 07/03/17   Barrett, ErLodema HongPA-C  traMADol (ULTRAM) 50 MG tablet Take 1-2 tablets (50-100 mg total) every 4 (four) hours as needed by mouth for moderate pain. Patient not taking: Reported on 08/15/2017 07/03/17   Barrett, ErLodema HongPA-C    Family History Family History  Problem Relation Age of Onset  . Heart disease  Mother   . Diabetes Unknown   . Hypertension Unknown   . Colon cancer Neg Hx     Social History Social History   Tobacco Use  . Smoking status: Never Smoker  . Smokeless tobacco: Never Used  Substance Use Topics  . Alcohol use: No    Alcohol/week: 0.0 oz  . Drug use: No     Allergies   Amlodipine; Verapamil; Clindamycin hcl; and Contrast media [iodinated diagnostic agents]   Review of Systems Review of Systems  Constitutional: Positive for activity change.  Respiratory: Positive for shortness of breath.   Cardiovascular: Negative for chest pain.  Allergic/Immunologic: Negative for immunocompromised state.  All other systems reviewed and are negative.    Physical Exam Updated Vital Signs BP (!) 142/87   Pulse 82   Temp 98.5 F (36.9 C) (Oral)   Resp 15   Ht 6' (1.829 m)   Wt 111.6 kg (246 lb)   SpO2 97%   BMI 33.36 kg/m   Physical Exam  Constitutional: He is oriented to person, place, and time. He appears well-developed.  HENT:  Head: Atraumatic.  Neck: Neck supple.  Cardiovascular: Normal rate.  Pulmonary/Chest: Effort normal. He has decreased breath sounds in the right lower field and the left lower field. He has no wheezes. He has no rales.  Abdominal: Soft.  Musculoskeletal:       Right lower leg: He exhibits edema.       Left lower leg: He exhibits edema.  2+ pitting edema bilaterally, right worse than left  Neurological: He is alert and oriented to person, place, and time.  Skin: Skin is warm.  Nursing note and vitals reviewed.    ED Treatments / Results  Labs (all labs ordered are listed, but only abnormal results are displayed) Labs Reviewed  TROPONIN I - Abnormal; Notable for the following components:      Result Value   Troponin I 0.03 (*)    All other components within normal limits  CBC WITH DIFFERENTIAL/PLATELET - Abnormal; Notable for the following components:   WBC 11.0 (*)    RBC 3.97 (*)    Hemoglobin 11.4 (*)    HCT 36.0 (*)     All other components within normal limits  COMPREHENSIVE METABOLIC PANEL - Abnormal; Notable for the following components:   Glucose, Bld 209 (*)    Total Protein 6.4 (*)  ALT 13 (*)    GFR calc non Af Amer 59 (*)    All other components within normal limits  BRAIN NATRIURETIC PEPTIDE - Abnormal; Notable for the following components:   B Natriuretic Peptide 2,945.1 (*)    All other components within normal limits    EKG  EKG Interpretation  Date/Time:  Sunday August 25 2017 10:07:39 EST Ventricular Rate:  82 PR Interval:    QRS Duration: 160 QT Interval:  423 QTC Calculation: 495 R Axis:   104 Text Interpretation:  Sinus rhythm Paired ventricular premature complexes Right bundle branch block No acute changes No significant change since last tracing Confirmed by Varney Biles [phone]) on 08/25/2017 10:25:38 AM       Radiology Dg Chest 2 View  Result Date: 08/25/2017 CLINICAL DATA:  Shortness of breath for several days. Patch angioplasty on 08/22/2017 and CABG on 06/27/2017. EXAM: CHEST  2 VIEW COMPARISON:  07/31/2017 and prior chest radiographs FINDINGS: Cardiomegaly and CABG changes noted. A trace right pleural effusion is noted. There is no evidence of airspace disease, edema or pneumothorax. No acute abnormalities are present. IMPRESSION: Trace right pleural effusion. Cardiomegaly and CABG changes. Electronically Signed   By: Margarette Canada M.D.   On: 08/25/2017 10:54    Procedures Procedures (including critical care time)  Medications Ordered in ED Medications  furosemide (LASIX) injection 40 mg (not administered)     Initial Impression / Assessment and Plan / ED Course  I have reviewed the triage vital signs and the nursing notes.  Pertinent labs & imaging results that were available during my care of the patient were reviewed by me and considered in my medical decision making (see chart for details).  Clinical Course as of Aug 26 1139  Sun Aug 25, 2017  1140  BNP is significantly elevated. Given that pretest probability for CHF was highest, we will treat patient as if he has CHF exacerbation.  If patient does not get better with the shortness of breath -or patient develops new hypoxia, he will need PE workup.  I will communicate this concern to the hospitalist B Natriuretic Peptide: (!) 2,945.1 [AN]    Clinical Course User Index [AN] Varney Biles, MD    Patient comes in with chief complaint of shortness of breath. Patient has history of CHF, CAD, but no lung disease.  History suggesting that patient has orthopnea with worsening pitting edema.  Patient does not have rales on our exam however. Pt also complains of hemoptysis, but has no infection like symptoms.  Differential diagnoses includes ACS, worsening of CHF, PE, pleural effusions, pneumonia.  We will start with basic workup for CHF, given the pretest probability of CHF is the highest.  We will consider getting d-dimer.  Final Clinical Impressions(s) / ED Diagnoses   Final diagnoses:  Acute combined systolic and diastolic congestive heart failure [facility])    ED Discharge Orders    None       Varney Biles, MD 08/25/17 1138    Varney Biles, MD 08/25/17 1141

## 2017-08-25 NOTE — ED Notes (Signed)
Patient transported to X-ray 

## 2017-08-25 NOTE — ED Triage Notes (Signed)
SOB with chest tightness, especially with exertion and lying flat. Hx of CABG in November and L carotid surgery 08/22/17

## 2017-08-25 NOTE — ED Notes (Signed)
Date and time results received: 08/25/17  1124 (use smartphrase ".now" to insert current time)  Test: tropnin Critical Value: 0.03  Name of Provider Notified: Nanavati  Orders Received? Orders received

## 2017-08-25 NOTE — H&P (Signed)
History and Physical  Larry [name] [id number]:[id number] DOB: 16-May-1941 DOA: 08/25/2017  Referring physician: Dr. Doyle Askew PCP: Alain Marion, [name], MD  Outpatient Specialists: cardiology Patient coming from: Home  Chief Complaint: Shortness of breath  HPI: Larry [name] is a 77 y.o. male with medical history significant for CAD s/p CABG 2 months ago, carotid artery disease s/p endardectomy 1 week ago, obesity, type 2 diabetes, HLD, BPH who presented to [facility] with complaints of intermittent dyspnea at rest. States symptoms started about 1 week ago and are progressively getting worse to the point that he is unable to sustain minimal exertion. Denies chest pain or palpitations. Occasional dry cough. Good urine output with home diuresis.   ED Course: BNP elevated 2945. LE 2+ pitting edema bilaterally. CXR 08/25/17 trace right pleural effusion, cardiomegaly and CABG changes. Admitted for acute CHF exacerbation for aggressive diuresis.  Review of Systems: Review of systems as stated in HPI are otherwise negative.   Past Medical History:  Diagnosis Date  . Carotid stenosis, left   . Cataracts, bilateral    removed  . Coronary artery disease   . Hyperlipemia   . Prostate CA ([place])   . Sleep apnea    Dr Ledora Bottcher ; uses CPAP  . Type II or unspecified type diabetes mellitus without mention of complication, not stated as uncontrolled   . Unspecified essential hypertension    Past Surgical History:  Procedure Laterality Date  . APPENDECTOMY    . CATARACT EXTRACTION, BILATERAL    . CORONARY ARTERY BYPASS GRAFT N/A 06/27/2017   Procedure: CORONARY ARTERY BYPASS GRAFTING (CABG), time four   using the left internal mammary artery, and right saphenous leg vein harvested endoscopically.;  Surgeon: Ivin Poot, MD;  Location: [place];  Service: Open Heart Surgery;  Laterality: N/A;  . CYSTOSCOPY N/A 06/27/2017   Procedure: CYSTOSCOPY FLEXIBLE, Balloon dilatation placement of foley catheter;  Surgeon: Ivin Poot, MD;  Location: [place];  Service: Open Heart Surgery;  Laterality: N/A;  . ENDARTERECTOMY Left 08/22/2017   Procedure: ENDARTERECTOMY CAROTID LEFT;  Surgeon: Serafina Mitchell, MD;  Location: [place];  Service: Vascular;  Laterality: Left;  . INTRAVASCULAR ULTRASOUND/IVUS N/A 06/24/2017   Procedure: Intravascular Ultrasound/IVUS;  Surgeon: Belva Crome, MD;  Location: [place] [facility];  Service: Cardiovascular;  Laterality: N/A;  LEFT MAIN  . LEFT HEART CATH AND CORONARY ANGIOGRAPHY N/A 06/24/2017   Procedure: LEFT HEART CATH AND CORONARY ANGIOGRAPHY;  Surgeon: Belva Crome, MD;  Location: [place] [facility];  Service: Cardiovascular;  Laterality: N/A;  . PATCH ANGIOPLASTY Left 08/22/2017   Procedure: PATCH ANGIOPLASTY USING Rueben Bash BIOLOGIC PATCH;  Surgeon: Serafina Mitchell, MD;  Location: [place];  Service: Vascular;  Laterality: Left;  . PROSTATECTOMY    . TEE WITHOUT CARDIOVERSION N/A 06/27/2017   Procedure: TRANSESOPHAGEAL ECHOCARDIOGRAM (TEE);  Surgeon: Prescott Gum, Collier Salina, MD;  Location: [place];  Service: Open Heart Surgery;  Laterality: N/A;  . TONSILLECTOMY    . ULTRASOUND GUIDANCE FOR VASCULAR ACCESS  06/24/2017   Procedure: Ultrasound Guidance For Vascular Access;  Surgeon: Belva Crome, MD;  Location: [place] [facility];  Service: Cardiovascular;;    Social History:  reports that  has never smoked. he has never used smokeless tobacco. He reports that he does not drink alcohol or use drugs.   Allergies  Allergen Reactions  . Amlodipine Swelling    Leg swelling  . Verapamil Swelling    Edema w/high dose  .  Clindamycin Hcl     UNSPECIFIED REACTION   . Contrast Media [Iodinated Diagnostic Agents] Nausea And Vomiting    Family History  Problem Relation Age of Onset  . Heart disease Mother   . Diabetes Unknown   . Hypertension Unknown   . Colon cancer Neg Hx      Prior to Admission medications   Medication Sig Start Date End Date Taking? Authorizing Provider    ACCU-CHEK FASTCLIX LANCETS MISC 1 each by Does not apply route 2 (two) times daily. Dx:E11.9 10/25/15   Plotnikov, [name], MD  Ascorbic Acid (VITAMIN C) 1000 MG tablet Take 1,000 mg by mouth daily.    [provider]  aspirin EC 325 MG EC tablet Take 1 tablet (325 mg total) daily by mouth. 07/03/17   Barrett, Erin R, PA-C  Cholecalciferol (VITAMIN D3) 1000 UNITS CAPS Take 1,000 Units by mouth daily.     [provider]  diphenhydrAMINE (BENADRYL) 50 MG capsule Take 50 mg by mouth 1 hour prior to your procedure. Patient not taking: Reported on 08/15/2017 07/08/17   Serafina Mitchell, MD  furosemide (LASIX) 40 MG tablet TAKE 1 TABLET BY MOUTH DAILY AS NEEDED 07/31/17   Plotnikov, [name], MD  glimepiride (AMARYL) 2 MG tablet Take 1 tablet (2 mg total) by mouth 2 (two) times daily. Patient taking differently: Take 2 mg by mouth daily with breakfast.  11/21/16   Plotnikov, [name], MD  glucose blood (ACCU-CHEK SMARTVIEW) test strip 1 each by Other route 2 (two) times daily. Use to check blood sugars twice a day Dx E11.9 11/21/16   Plotnikov, [name], MD  glucose blood test strip 1 each by Other route 2 (two) times daily. Please dispense brand of choice per pt and insurance preference. Dx:250.00 05/08/13   Plotnikov, [name], MD  glucose monitoring kit (FREESTYLE) monitoring kit 1 each by Does not apply route as needed for other. Please dispense brand of choice per ins/patient. Dx: 250.00. 05/08/13   Plotnikov, [name], MD  losartan (COZAAR) 100 MG tablet Take 1 tablet (100 mg total) by mouth daily. Patient taking differently: Take 100 mg by mouth every evening.  08/06/17 11/04/17  Burtis Junes, NP  metFORMIN (GLUCOPHAGE) 1000 MG tablet Take 1 tablet (1,000 mg total) by mouth 2 (two) times daily with a meal. 08/07/16   Plotnikov, [name], MD  metoprolol tartrate (LOPRESSOR) 50 MG tablet Take 1 tablet (50 mg total) 2 (two) times daily by mouth. 07/03/17   Barrett, Erin R, PA-C   nitroGLYCERIN (NITROSTAT) 0.4 MG SL tablet Place 1 tablet (0.4 mg total) under the tongue every 5 (five) minutes as needed for chest pain. 06/18/17   Plotnikov, [name], MD  Omega-3 Fatty Acids (OMEGA-3 FISH OIL) 300 MG CAPS Take 300 mg by mouth daily.    [provider]  oxyCODONE-acetaminophen (PERCOCET/ROXICET) 5-325 MG tablet Take 1 tablet by mouth every 6 (six) hours as needed for moderate pain. 08/23/17   Dagoberto Ligas, PA-C  potassium chloride SA (K-DUR,KLOR-CON) 20 MEQ tablet Take 1 tablet (20 mEq total) daily by mouth. Patient taking differently: Take 20 mEq by mouth daily as needed (swelling). Take with Lasix 07/03/17   Barrett, Erin R, PA-C  predniSONE (DELTASONE) 50 MG tablet One tablet ('50mg'$ ) 13 hours prior to procedure; one tablet ('50mg'$ ) 7 hours prior to procedure and then one tablet (50 mg) one hour prior to procedure. Patient not taking: Reported on 08/15/2017 07/08/17   Serafina Mitchell,  MD  simvastatin (ZOCOR) 40 MG tablet Take 1 tablet (40 mg total) by mouth at bedtime. 08/07/16   Plotnikov, [name], MD  tamsulosin (FLOMAX) 0.4 MG CAPS capsule Take 1 capsule (0.4 mg total) daily by mouth. Patient not taking: Reported on 08/15/2017 07/03/17   Barrett, Lodema Hong, PA-C  traMADol (ULTRAM) 50 MG tablet Take 1-2 tablets (50-100 mg total) every 4 (four) hours as needed by mouth for moderate pain. Patient not taking: Reported on 08/15/2017 07/03/17   Barrett, Lodema Hong, PA-C    Physical Exam: BP (!) 164/70   Pulse 92   Temp 98.5 F (36.9 C) (Oral)   Resp 16   Ht 6' (1.829 m)   Wt 111.6 kg (246 lb)   SpO2 97%   BMI 33.36 kg/m   General:  Obese 77 yo CM NAD A&O x 3 Eyes: puplis round and reactive to light ENT: Mucosa is moist no erythema or exudates Neck: No JVD noted  Cardiovascular: RRR no rubs or gallops  Respiratory: Mild crackles at bases. No wheezes  Abdomen: obese NT ND NBS x4  Skin: no noted open lesions  Musculoskeletal: no focal moves all 4 limbs  freely Psychiatric: Mood is appropriate for condition and setting  Neurologic: A&O x3           Labs on Admission:  Basic Metabolic Panel: Recent Labs  Lab 08/19/17 1100 08/22/17 1800 08/23/17 0234 08/25/17 1025  NA 137  --  138 137  K 4.0  --  4.0 3.8  CL 100*  --  105 104  CO2 27  --  23 24  GLUCOSE 198*  --  242* 209*  BUN 19  --  17 20  CREATININE 1.25* 1.03 1.15 1.17  CALCIUM 9.7  --  8.4* 9.0   Liver Function Tests: Recent Labs  Lab 08/19/17 1100 08/25/17 1025  AST 22 21  ALT 16* 13*  ALKPHOS 78 71  BILITOT 0.9 0.7  PROT 6.7 6.4*  ALBUMIN 3.6 3.6   No results for input(s): LIPASE, AMYLASE in the last 168 hours. No results for input(s): AMMONIA in the last 168 hours. CBC: Recent Labs  Lab 08/19/17 1100 08/22/17 1800 08/23/17 0234 08/25/17 1025  WBC 9.7 13.0* 9.4 11.0*  NEUTROABS  --   --   --  6.8  HGB 12.3* 12.0* 11.1* 11.4*  HCT 39.2 38.9* 36.5* 36.0*  MCV 91.8 90.9 90.3 90.7  PLT 295 256 278 293   Cardiac Enzymes: Recent Labs  Lab 08/25/17 1025  TROPONINI 0.03*    BNP (last 3 results) Recent Labs    08/25/17 1025  BNP 2,945.1*    ProBNP (last 3 results) Recent Labs    06/18/17 0847  PROBNP 323.0*    CBG: Recent Labs  Lab 08/19/17 1019 08/22/17 0912 08/22/17 1118 08/22/17 1554  GLUCAP 217* 166* 115* 144*    Radiological Exams on Admission: Dg Chest 2 View  Result Date: 08/25/2017 CLINICAL DATA:  Shortness of breath for several days. Patch angioplasty on 08/22/2017 and CABG on 06/27/2017. EXAM: CHEST  2 VIEW COMPARISON:  07/31/2017 and prior chest radiographs FINDINGS: Cardiomegaly and CABG changes noted. A trace right pleural effusion is noted. There is no evidence of airspace disease, edema or pneumothorax. No acute abnormalities are present. IMPRESSION: Trace right pleural effusion. Cardiomegaly and CABG changes. Electronically Signed   By: Margarette Canada M.D.   On: 08/25/2017 10:54    EKG: Independently reviewed.  Sinus  rhythm with occasional PVCs  Assessment/Plan Present on Admission: . 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 % -strict I&Os -daily weight -heart healthy diet with salt restriction -fluid restriction -lasix 40 mg IV BID -last echo 06/26/17  LVEF 40-45% -Consult heart failure team in the am  CAD s/p CABG  -CABG 2 months ago -telemetry monitoring -no chest pain  Chonic normocytic anemia -possibly dilutional -no sign of overt bleeding -hg 11.4  -baseline hg 12 -CBC  Am  Carotid artery disease s/p left endardectomy  -asa, simvastatin  Non insulin type 2 diabetes -A1C 7.1 (06/25/17) -ISS with hypoglycemia protocol -hold oral antiglycemics -BMP, A1C am  Obesity -weight loss when more stable -PT assess and treat   DVT prophylaxis: Lovenox 40 mg sq daily  Code Status: Full   Family Communication: wife and daughter at bedside  Disposition Plan: will stay 2 midnight for aggressive diuresis and improvement of symptomology   Consults called: Please call cardiology in the am and make aware of patient.   Admission status: Inpatient    Kayleen Memos MD [facility] Pager [phone]  If 7PM-7AM, please contact night-coverage [email or web address] Password [facility]  08/25/2017, 5:43 PM

## 2017-08-25 NOTE — ED Notes (Addendum)
Assumed care of patient from [place], [place]. Pt resting quietly. NO distress. NO complaints. Awaiting test results. Call bell within reach.

## 2017-08-26 ENCOUNTER — Ambulatory Visit: Payer: Medicare Other | Admitting: Internal Medicine

## 2017-08-26 LAB — COMPREHENSIVE METABOLIC PANEL
ALT: 13 U/L — ABNORMAL LOW (ref 17–63)
AST: 15 U/L (ref 15–41)
Albumin: 3.4 g/dL — ABNORMAL LOW (ref 3.5–5.0)
Alkaline Phosphatase: 68 U/L (ref 38–126)
Anion gap: 8 (ref 5–15)
BUN: 19 mg/dL (ref 6–20)
CHLORIDE: 103 mmol/L (ref 101–111)
CO2: 28 mmol/L (ref 22–32)
Calcium: 9.1 mg/dL (ref 8.9–10.3)
Creatinine, Ser: 1.2 mg/dL (ref 0.61–1.24)
GFR, EST NON AFRICAN AMERICAN: 57 mL/min — AB (ref >60)
Glucose, Bld: 201 mg/dL — ABNORMAL HIGH (ref 65–99)
POTASSIUM: 3.4 mmol/L — AB (ref 3.5–5.1)
SODIUM: 139 mmol/L (ref 135–145)
Total Bilirubin: 0.6 mg/dL (ref 0.3–1.2)
Total Protein: 6.5 g/dL (ref 6.5–8.1)

## 2017-08-26 LAB — HEMOGLOBIN A1C
HEMOGLOBIN A1C: 6.2 % — AB (ref 4.8–5.6)
MEAN PLASMA GLUCOSE: 131.24 mg/dL

## 2017-08-26 LAB — CBC
HCT: 36.5 % — ABNORMAL LOW (ref 39.0–52.0)
Hemoglobin: 11.5 g/dL — ABNORMAL LOW (ref 13.0–17.0)
MCH: 28.5 pg (ref 26.0–34.0)
MCHC: 31.5 g/dL (ref 30.0–36.0)
MCV: 90.3 fL (ref 78.0–100.0)
PLATELETS: 269 10*3/uL (ref 150–400)
RBC: 4.04 MIL/uL — AB (ref 4.22–5.81)
RDW: 13.8 % (ref 11.5–15.5)
WBC: 10 10*3/uL (ref 4.0–10.5)

## 2017-08-26 LAB — POCT ACTIVATED CLOTTING TIME: Activated Clotting Time: 142 seconds

## 2017-08-26 LAB — MAGNESIUM: MAGNESIUM: 1.6 mg/dL — AB (ref 1.7–2.4)

## 2017-08-26 LAB — GLUCOSE, CAPILLARY
GLUCOSE-CAPILLARY: 189 mg/dL — AB (ref 65–99)
GLUCOSE-CAPILLARY: 162 mg/dL — AB (ref 65–99)
GLUCOSE-CAPILLARY: 198 mg/dL — AB (ref 65–99)
Glucose-Capillary: 105 mg/dL — ABNORMAL HIGH (ref 65–99)

## 2017-08-26 LAB — PHOSPHORUS: PHOSPHORUS: 3 mg/dL (ref 2.5–4.6)

## 2017-08-26 MED ORDER — FUROSEMIDE 40 MG PO TABS
40.0000 mg | ORAL_TABLET | Freq: Two times a day (BID) | ORAL | Status: DC
  Administered 2017-08-26 – 2017-08-27 (×2): 40 mg via ORAL
  Filled 2017-08-26 (×2): qty 1

## 2017-08-26 MED ORDER — MAGNESIUM SULFATE 4 GM/100ML IV SOLN
4.0000 g | Freq: Once | INTRAVENOUS | Status: AC
  Administered 2017-08-26: 4 g via INTRAVENOUS
  Filled 2017-08-26: qty 100

## 2017-08-26 MED ORDER — POTASSIUM CHLORIDE CRYS ER 20 MEQ PO TBCR
20.0000 meq | EXTENDED_RELEASE_TABLET | Freq: Every day | ORAL | Status: DC
  Administered 2017-08-26 – 2017-08-27 (×2): 20 meq via ORAL
  Filled 2017-08-26 (×2): qty 1

## 2017-08-26 MED ORDER — ENOXAPARIN SODIUM 40 MG/0.4ML ~~LOC~~ SOLN
40.0000 mg | SUBCUTANEOUS | Status: DC
  Administered 2017-08-26: 40 mg via SUBCUTANEOUS
  Filled 2017-08-26 (×2): qty 0.4

## 2017-08-26 MED ORDER — POTASSIUM CHLORIDE CRYS ER 20 MEQ PO TBCR
40.0000 meq | EXTENDED_RELEASE_TABLET | Freq: Once | ORAL | Status: AC
  Administered 2017-08-26: 40 meq via ORAL
  Filled 2017-08-26: qty 2

## 2017-08-26 MED ORDER — LABETALOL HCL 5 MG/ML IV SOLN
10.0000 mg | Freq: Once | INTRAVENOUS | Status: AC
  Administered 2017-08-26: 10 mg via INTRAVENOUS
  Filled 2017-08-26: qty 4

## 2017-08-26 NOTE — Evaluation (Signed)
Physical Therapy One Time Evaluation Patient Details Name: Larry [name] MRN: [id number] DOB: 07/30/1941 Today's Date: 08/26/2017   History of Present Illness  77 y.o. male with medical history significant for CAD s/p CABG 2 months ago, carotid artery disease s/p endardectomy 1 week ago, obesity, type 2 diabetes, HLD, BPH and admitted for acute on chronic congestive heart failure  Clinical Impression  Patient evaluated by Physical Therapy with no further acute PT needs identified. All education has been completed and the patient has no further questions.  Pt mobilizing very well and denies any symptoms with ambulation.  Pt plans to f/u with cardiac rehab once cleared from his MD. See below for any follow-up Physical Therapy or equipment needs. PT is signing off. Thank you for this referral.      Follow Up Recommendations Other (comment)(plans to f/u with cardiac rehab when released by MD)    Equipment Recommendations  None recommended by PT    Recommendations for Other Services       Precautions / Restrictions Precautions Precautions: None      Mobility  Bed Mobility Overal bed mobility: Independent                Transfers Overall transfer level: Independent                  Ambulation/Gait Ambulation/Gait assistance: Modified independent (Device/Increase time);Supervision Ambulation Distance (Feet): 350 Feet Assistive device: None Gait Pattern/deviations: WFL(Within Functional Limits)     General Gait Details: no deficits observed, no symptoms reported, denies SOB  Stairs            Wheelchair Mobility    Modified Rankin (Stroke Patients Only)       Balance Overall balance assessment: Modified Independent(denies falls)                                           Pertinent Vitals/Pain Pain Assessment: No/denies pain    Home Living Family/patient expects to be discharged to:: Private residence Living Arrangements:  Spouse/significant other Available Help at Discharge: Family Type of Home: House       Home Layout: Able to live on main level with bedroom/bathroom Home Equipment: None      Prior Function Level of Independence: Independent               Hand Dominance        Extremity/Trunk Assessment        Lower Extremity Assessment Lower Extremity Assessment: Overall WFL for tasks assessed    Cervical / Trunk Assessment Cervical / Trunk Assessment: Normal  Communication   Communication: No difficulties  Cognition Arousal/Alertness: Awake/alert Behavior During Therapy: WFL for tasks assessed/performed Overall Cognitive Status: Within Functional Limits for tasks assessed                                        General Comments      Exercises     Assessment/Plan    PT Assessment Patent does not need any further PT services  PT Problem List         PT Treatment Interventions      PT Goals (Current goals can be found in the Care Plan section)  Acute Rehab PT Goals PT Goal Formulation: All assessment and education complete, DC  therapy    Frequency     Barriers to discharge        Co-evaluation               AM-PAC PT "6 Clicks" Daily Activity  Outcome Measure Difficulty turning over in bed (including adjusting bedclothes, sheets and blankets)?: None Difficulty moving from lying on back to sitting on the side of the bed? : None Difficulty sitting down on and standing up from a chair with arms (e.g., wheelchair, bedside commode, etc,.)?: None Help needed moving to and from a bed to chair (including a wheelchair)?: None Help needed walking in hospital room?: None Help needed climbing 3-5 steps with a railing? : None 6 Click Score: 24    End of Session   Activity Tolerance: Patient tolerated treatment well Patient left: in bed;with call bell/phone within reach Nurse Communication: Mobility status PT Visit Diagnosis: Difficulty in  walking, not elsewhere classified (R26.2)    Time: 3382-5053 PT Time Calculation (min) (ACUTE ONLY): 11 min   Charges:   PT Evaluation $PT Eval Low Complexity: 1 Low     PT G CodesCarmelia Bake, PT, DPT 08/26/2017 Pager: [phone] [place] Ram E 08/26/2017, 12:30 PM

## 2017-08-26 NOTE — Care Management Note (Signed)
Case Management Note  Patient Details  Name: Larry [name] MRN: [id number] Date of Birth: 18-Jan-1941  Subjective/Objective:  77 y/o m admitted w/CHF. From home. Readmit-CHF. 3 admissions past 6 months. PT cons-await recc. May benefit from [facility] also.                  Action/Plan:d/c plan home.   Expected Discharge Date:                  Expected Discharge Plan:  [place]  In-House Referral:     Discharge planning Services  CM Consult  Post Acute Care Choice:    Choice offered to:     DME Arranged:    DME Agency:     HH Arranged:    [place] Agency:     Status of Service:  In process, will continue to follow  If discussed at Long Length of Stay Meetings, dates discussed:    Additional Comments:  Dessa Phi, RN 08/26/2017, 10:30 AM

## 2017-08-26 NOTE — Progress Notes (Signed)
PROGRESS NOTE    KEIYON PLACK  OYD:[id number] DOB: [birth year] DOA: 08/25/2017 PCP: Cassandria Anger, MD   Brief Narrative:  Larry [name] is a 77 y.o. male with medical history significant for CAD s/p CABG 2 months ago, carotid artery disease s/p endardectomy 1 week ago, obesity, type 2 diabetes, HLD, BPH who presented to [facility] with complaints of intermittent dyspnea at rest. States symptoms started about 1 week ago and are progressively getting worse to the point that he is unable to sustain minimal exertion. Denies chest pain or palpitations. Occasional dry cough. Good urine output with home diuresis.   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 % - he was on prn lasix then told to d/c this so hasn't taken it since CABG -strict I&Os -daily weight (he notes he weighs 246 at home) -heart healthy diet with salt restriction -fluid restriction -lasix 40 mg IV BID -> transition to 40 mg BID today, hopefully d/c with PO lasix tomorrow -last echo 06/26/17  LVEF 40-45% -Will hold off on cardiology c/s for now given his sx much improved with IV lasix  Filed Weights   08/25/17 0959 08/26/17 0500  Weight: 111.6 kg (246 lb) 111.8 kg (246 lb 7.6 oz)   CAD s/p CABG  -CABG 2 months ago -telemetry monitoring -no chest pain  Chonic normocytic anemia -stable  -daily H/H  Carotid artery disease s/p left endardectomy  -asa, simvastatin  Non insulin type 2 diabetes -A1C 7.1 (06/25/17) -ISS with hypoglycemia protocol -hold oral antiglycemics -BMP, A1C am  Obesity -weight loss when more stable -PT assess and treat  DVT prophylaxis: lovenox Code Status: full  Family Communication: will attempt to call in afternoon, none at bedside Disposition Plan: pending   Consultants:   none  Procedures: (Don't  include imaging studies which can be auto populated. Include things that cannot be auto populated i.e. Echo, Carotid and venous dopplers, Foley, Bipap, HD, tubes/drains, wound vac, central lines etc)  none  Antimicrobials: (specify start and planned stop date. Auto populated tables are space occupying and do not give end dates)  none   Subjective: Feeling better.  Able to walk with PT this morning.  SOB improved.   Objective: Vitals:   08/25/17 2240 08/26/17 0450 08/26/17 0500 08/26/17 0611  BP: (!) 161/89 (!) 194/90  (!) 153/92  Pulse:  89    Resp: 18 19    Temp:  98.2 F (36.8 C)    TempSrc:  Oral    SpO2:  98%    Weight:   111.8 kg (246 lb 7.6 oz)   Height:        Intake/Output Summary (Last 24 hours) at 08/26/2017 1020 Last data filed at 08/26/2017 0949 Gross per 24 hour  Intake 600 ml  Output -  Net 600 ml   Filed Weights   08/25/17 0959 08/26/17 0500  Weight: 111.6 kg (246 lb) 111.8 kg (246 lb 7.6 oz)    Examination:  General exam: Appears calm and comfortable  Respiratory system: Clear to auscultation. Respiratory effort normal. Cardiovascular system: S1 & S2 heard, RRR. No JVD, murmurs, rubs, gallops or clicks. No pedal edema.  Hepatojugular reflex positive.  Gastrointestinal system: Abdomen is nondistended, soft and nontender. No organomegaly or masses felt. Normal bowel sounds heard. Central nervous system: Alert and oriented.  No focal neurological deficits. Extremities: L slightly greater than R 2+ [name] (he notes this is chronic and he's had a LE doppler before) Skin: No rashes, lesions or ulcers Psychiatry: Judgement and insight appear normal. Mood & affect appropriate.     Data Reviewed: I have personally reviewed following labs and imaging studies  CBC: Recent Labs  Lab 08/19/17 1100 08/22/17 1800 08/23/17 0234 08/25/17 1025 08/26/17 0600  WBC 9.7 13.0* 9.4 11.0* 10.0  NEUTROABS  --   --   --  6.8  --   HGB 12.3* 12.0* 11.1* 11.4* 11.5*  HCT  39.2 38.9* 36.5* 36.0* 36.5*  MCV 91.8 90.9 90.3 90.7 90.3  PLT 295 256 278 293 017   Basic Metabolic Panel: Recent Labs  Lab 08/19/17 1100 08/22/17 1800 08/23/17 0234 08/25/17 1025 08/26/17 0600  NA 137  --  138 137 139  K 4.0  --  4.0 3.8 3.4*  CL 100*  --  105 104 103  CO2 27  --  23 24 28   GLUCOSE 198*  --  242* 209* 201*  BUN 19  --  17 20 19   CREATININE 1.25* 1.03 1.15 1.17 1.20  CALCIUM 9.7  --  8.4* 9.0 9.1  MG  --   --   --   --  1.6*  PHOS  --   --   --   --  3.0   GFR: Estimated Creatinine Clearance: 68.7 mL/min (by C-G formula based on SCr of 1.2 mg/dL). Liver Function Tests: Recent Labs  Lab 08/19/17 1100 08/25/17 1025 08/26/17 0600  AST 22 21 15   ALT 16* 13* 13*  ALKPHOS 78 71 68  BILITOT 0.9 0.7 0.6  PROT 6.7 6.4* 6.5  ALBUMIN 3.6 3.6 3.4*   No results for input(s): LIPASE, AMYLASE in the last 168 hours. No results for input(s): AMMONIA in the last 168 hours. Coagulation Profile: Recent Labs  Lab 08/19/17 1100  INR 1.01   Cardiac Enzymes: Recent Labs  Lab 08/25/17 1025  TROPONINI 0.03*   BNP (last 3 results) Recent Labs    06/18/17 0847  PROBNP 323.0*   HbA1C: Recent Labs    08/25/17 1940  HGBA1C 6.2*   CBG: Recent Labs  Lab 08/22/17 0912 08/22/17 1118 08/22/17 1554 08/26/17 0719  GLUCAP 166* 115* 144* 189*   Lipid Profile: No results for input(s): CHOL, HDL, LDLCALC, TRIG, CHOLHDL, LDLDIRECT in the last 72 hours. Thyroid Function Tests: No results for input(s): TSH, T4TOTAL, FREET4, T3FREE, THYROIDAB in the last 72 hours. Anemia Panel: No results for input(s): VITAMINB12, FOLATE, FERRITIN, TIBC, IRON, RETICCTPCT in the last 72 hours. Sepsis Labs: No results for input(s): PROCALCITON, LATICACIDVEN in the last 168 hours.  Recent Results (from the past 240 hour(s))  Surgical pcr screen     Status: None   Collection Time: 08/19/17 10:39 AM  Result Value Ref Range Status   MRSA, PCR NEGATIVE NEGATIVE Final    Staphylococcus aureus NEGATIVE NEGATIVE Final    Comment: (NOTE) The Xpert SA Assay (FDA approved for NASAL specimens in patients 92 years of age and older), is one component of a comprehensive surveillance program. It is not intended to diagnose infection nor to guide or monitor treatment.          Radiology Studies: Dg Chest 2 View  Result Date: 08/25/2017 CLINICAL DATA:  Shortness of breath for several days. Patch angioplasty on 08/22/2017 and CABG on 06/27/2017. EXAM: CHEST  2 VIEW COMPARISON:  07/31/2017 and prior chest  radiographs FINDINGS: Cardiomegaly and CABG changes noted. A trace right pleural effusion is noted. There is no evidence of airspace disease, edema or pneumothorax. No acute abnormalities are present. IMPRESSION: Trace right pleural effusion. Cardiomegaly and CABG changes. Electronically Signed   By: Margarette Canada M.D.   On: 08/25/2017 10:54        Scheduled Meds: . aspirin  325 mg Oral Daily  . cholecalciferol  1,000 Units Oral Daily  . enoxaparin (LOVENOX) injection  40 mg Subcutaneous Q24H  . furosemide  40 mg Intravenous BID  . insulin aspart  0-9 Units Subcutaneous TID WC  . losartan  100 mg Oral Daily  . metoprolol tartrate  50 mg Oral BID  . potassium chloride  20 mEq Oral Daily  . simvastatin  40 mg Oral QHS   Continuous Infusions: . magnesium sulfate 1 - 4 g bolus IVPB       LOS: 1 day    Time spent: over 30 min    Fayrene Helper, MD [facility] [phone]  If 7PM-7AM, please contact night-coverage [email or web address] Password [email or web address] 08/26/2017, 10:20 AM

## 2017-08-27 LAB — BASIC METABOLIC PANEL
ANION GAP: 8 (ref 5–15)
BUN: 19 mg/dL (ref 6–20)
CALCIUM: 8.8 mg/dL — AB (ref 8.9–10.3)
CO2: 27 mmol/L (ref 22–32)
Chloride: 103 mmol/L (ref 101–111)
Creatinine, Ser: 0.95 mg/dL (ref 0.61–1.24)
GFR calc Af Amer: >60 mL/min (ref >60)
GFR calc non Af Amer: >60 mL/min (ref >60)
GLUCOSE: 204 mg/dL — AB (ref 65–99)
Potassium: 3.7 mmol/L (ref 3.5–5.1)
Sodium: 138 mmol/L (ref 135–145)

## 2017-08-27 LAB — CBC
HEMATOCRIT: 36.6 % — AB (ref 39.0–52.0)
Hemoglobin: 11.6 g/dL — ABNORMAL LOW (ref 13.0–17.0)
MCH: 28.5 pg (ref 26.0–34.0)
MCHC: 31.7 g/dL (ref 30.0–36.0)
MCV: 89.9 fL (ref 78.0–100.0)
Platelets: 291 10*3/uL (ref 150–400)
RBC: 4.07 MIL/uL — ABNORMAL LOW (ref 4.22–5.81)
RDW: 13.6 % (ref 11.5–15.5)
WBC: 10.1 10*3/uL (ref 4.0–10.5)

## 2017-08-27 LAB — GLUCOSE, CAPILLARY: Glucose-Capillary: 190 mg/dL — ABNORMAL HIGH (ref 65–99)

## 2017-08-27 MED ORDER — POTASSIUM CHLORIDE CRYS ER 20 MEQ PO TBCR: 20.0000 meq | EXTENDED_RELEASE_TABLET | Freq: Every day | ORAL | 0 refills | Status: DC

## 2017-08-27 MED ORDER — FUROSEMIDE 40 MG PO TABS: 40.0000 mg | ORAL_TABLET | Freq: Every day | ORAL | 0 refills | Status: DC

## 2017-08-27 NOTE — Discharge Summary (Signed)
Physician Discharge Summary  Larry [name]:[id number] DOB: 1941-05-19 DOA: 08/25/2017  PCP: Cassandria Anger, MD  Admit date: 08/25/2017 Discharge date: 08/27/2017  Time spent: over 30 minutes  Recommendations for Outpatient Follow-up:  1. Follow up outpatient CBC/CMP (attention to K and need for adjustment in k supplementation) 2. Follow up weights, lasix dosing  Discharge Diagnoses:  Active Problems:   CHF (congestive heart failure) (HCC)   Acute heart failure with reduced ejection fraction and diastolic dysfunction [facility])   Discharge Condition: stable  Diet recommendation: heart healthy  Filed Weights   08/25/17 0959 08/26/17 0500 08/27/17 0440  Weight: 111.6 kg (246 lb) 111.8 kg (246 lb 7.6 oz) 112 kg (246 lb 14.6 oz)    History of present illness:  [name] Larry [name] 77 y.o.malewith medical history significant forCAD s/p CABG 2 months ago, carotid artery disease s/p endardectomy 1 week ago, obesity, type 2 diabetes, HLD, BPH who presented to [facility] with complaints of intermittent dyspnea at rest. States symptoms started about 1 week ago and are progressively getting worse to the point that he is unable to sustain minimal exertion. Denies chest pain or palpitations. Occasional dry cough. Good urine output with home diuresis.  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  Will d/c with 40 mg BID x 3 days then resume 40 mg daily.  Follow up with PCP and cardiology.  -he was on prn lasix then told to d/c this so hasn't taken it since CABG -strict I&Os -daily weight (he notes he weighs 246 at home) -heart healthy diet with salt restriction -fluid restriction -lasix 40 mg IV BID -> transition to 40 mg BID -last echo11/7/18 LVEF 40-45% -Will hold off on cardiology c/s for now given his sx much improved with IV lasix      Filed Weights   08/25/17 0959 08/26/17 0500  Weight: 111.6  kg (246 lb) 111.8 kg (246 lb 7.6 oz)   CAD s/p CABG  -CABG 2 months ago -telemetry monitoring -no chest pain  Chonic normocytic anemia -stable  -daily H/H  Carotid artery disease s/p left endardectomy  -asa, simvastatin  Non insulin type 2 diabetes -A1C 7.1 (06/25/17) -ISS with hypoglycemia protocol -hold oral antiglycemics -BMP, A1C am  Obesity -weight loss when more stable -PT assess and treat  Procedures:  none (i.e. Studies not automatically included, echos, thoracentesis, etc; not x-rays)  Consultations:  none  Discharge Exam: Vitals:   08/26/17 2115 08/27/17 0440  BP: (!) 160/87 (!) 154/91  Pulse: (!) 101 87  Resp:    Temp: 97.9 F (36.6 C) 98.9 F (37.2 C)  SpO2: 99% 98%   Took Donette Mainwaring few laps in hall today.  No SOB. Feeling much better.  General: No acute distress. Cardiovascular: Heart sounds show Angelita Harnack regular rate, and rhythm. No gallops or rubs. No murmurs. HJR Lungs: Clear to auscultation bilaterally with good air movement. No rales, rhonchi or wheezes. Abdomen: Soft, nontender, nondistended with normal active bowel sounds. No masses. No hepatosplenomegaly. Neurological: Alert and oriented 3. Moves all extremities 4 with equal strength. Cranial nerves II through XII grossly intact. Skin: Warm and dry. No rashes or lesions. Extremities: No clubbing or cyanosis. Chronic R>L 2+ [name] (chronic asymmetry)  Psychiatric: Mood and affect are normal. Insight and judgment are appropriate.  Discharge Instructions   Discharge Instructions    Call MD for:  difficulty breathing, headache or visual disturbances  Complete by:  As directed    Call MD for:  extreme fatigue   Complete by:  As directed    Call MD for:  persistant dizziness or light-headedness   Complete by:  As directed    Call MD for:  persistant nausea and vomiting   Complete by:  As directed    Call MD for:  redness, tenderness, or signs of infection (pain, swelling, redness, odor or  green/yellow discharge around incision site)   Complete by:  As directed    Call MD for:  severe uncontrolled pain   Complete by:  As directed    Call MD for:  temperature >100.4   Complete by:  As directed    Diet - low sodium heart healthy   Complete by:  As directed    Discharge instructions   Complete by:  As directed    You were seen for [name] heart failure exacerbation.  You improved with lasix.  Please continue lasix twice daily for the next 3 days then resume 40 mg daily.  Take your weight daily.  If your weight increase more than 2-3 lbs in [name] day or more than 5 lbs in 7 days, take an extra dose of lasix (and call your doctor).  Return if you have new, worsening, or recurrent symptoms.  Please call your PCP to follow up within the week for repeat labs.  Take potassium while you are taking your lasix.  Please have your PCP request records from this hospitalization so they know what was done.   Heart Failure patients record your daily weight using the same scale at the same time of day   Complete by:  As directed    Increase activity slowly   Complete by:  As directed    Schedule appointment   Complete by:  As directed    With PCP and cardiology     Allergies as of 08/27/2017      Reactions   Amlodipine Swelling   Leg swelling   Verapamil Swelling   Edema w/high dose   Clindamycin Hcl    UNSPECIFIED REACTION    Contrast Media [iodinated Diagnostic Agents] Nausea And Vomiting      Medication List    STOP taking these medications   diphenhydrAMINE 50 MG capsule Commonly known as:  BENADRYL   predniSONE 50 MG tablet Commonly known as:  DELTASONE     TAKE these medications   ACCU-CHEK FASTCLIX LANCETS Misc 1 each by Does not apply route 2 (two) times daily. Dx:E11.9   aspirin 325 MG EC tablet Take 1 tablet (325 mg total) daily by mouth.   furosemide 40 MG tablet Commonly known as:  LASIX Take 1 tablet (40 mg total) by mouth daily. (take twice daily for 3 days then  daily) What changed:    when to take this  reasons to take this  additional instructions   glimepiride 2 MG tablet Commonly known as:  AMARYL Take 1 tablet (2 mg total) by mouth 2 (two) times daily. What changed:  when to take this   glucose blood test strip 1 each by Other route 2 (two) times daily. Please dispense brand of choice per pt and insurance preference. Dx:250.00   glucose blood test strip Commonly known as:  ACCU-CHEK SMARTVIEW 1 each by Other route 2 (two) times daily. Use to check blood sugars twice Shakeria Robinette day Dx E11.9   glucose monitoring kit monitoring kit 1 each by Does not apply route as needed  for other. Please dispense brand of choice per ins/patient. Dx: 250.00.   Krill Oil 1000 MG Caps Take 1,000 mg by mouth daily.   losartan 100 MG tablet Commonly known as:  COZAAR Take 1 tablet (100 mg total) by mouth daily. What changed:  when to take this   metFORMIN 1000 MG tablet Commonly known as:  GLUCOPHAGE Take 1 tablet (1,000 mg total) by mouth 2 (two) times daily with Hibba Schram meal.   metoprolol tartrate 50 MG tablet Commonly known as:  LOPRESSOR Take 1 tablet (50 mg total) 2 (two) times daily by mouth. What changed:  how much to take   nitroGLYCERIN 0.4 MG SL tablet Commonly known as:  NITROSTAT Place 1 tablet (0.4 mg total) under the tongue every 5 (five) minutes as needed for chest pain.   oxyCODONE-acetaminophen 5-325 MG tablet Commonly known as:  PERCOCET/ROXICET Take 1 tablet by mouth every 6 (six) hours as needed for moderate pain.   potassium chloride SA 20 MEQ tablet Commonly known as:  K-DUR,KLOR-CON Take 1 tablet (20 mEq total) by mouth daily. What changed:    when to take this  reasons to take this  additional instructions   simvastatin 40 MG tablet Commonly known as:  ZOCOR Take 1 tablet (40 mg total) by mouth at bedtime.   tamsulosin 0.4 MG Caps capsule Commonly known as:  FLOMAX Take 1 capsule (0.4 mg total) daily by mouth.    traMADol 50 MG tablet Commonly known as:  ULTRAM Take 1-2 tablets (50-100 mg total) every 4 (four) hours as needed by mouth for moderate pain.   vitamin C 1000 MG tablet Take 1,000 mg by mouth daily.   Vitamin D3 1000 units Caps Take 1,000 Units by mouth daily.      Allergies  Allergen Reactions  . Amlodipine Swelling    Leg swelling  . Verapamil Swelling    Edema w/high dose  . Clindamycin Hcl     UNSPECIFIED REACTION   . Contrast Media [Iodinated Diagnostic Agents] Nausea And Vomiting      The results of significant diagnostics from this hospitalization (including imaging, microbiology, ancillary and laboratory) are listed below for reference.    Significant Diagnostic Studies: Dg Chest 2 View  Result Date: 08/25/2017 CLINICAL DATA:  Shortness of breath for several days. Patch angioplasty on 08/22/2017 and CABG on 06/27/2017. EXAM: CHEST  2 VIEW COMPARISON:  07/31/2017 and prior chest radiographs FINDINGS: Cardiomegaly and CABG changes noted. Aerika Groll trace right pleural effusion is noted. There is no evidence of airspace disease, edema or pneumothorax. No acute abnormalities are present. IMPRESSION: Trace right pleural effusion. Cardiomegaly and CABG changes. Electronically Signed   By: Margarette Canada M.D.   On: 08/25/2017 10:54   Dg Chest 2 View  Result Date: 07/31/2017 CLINICAL DATA:  CABG EXAM: CHEST  2 VIEW COMPARISON:  07/02/2017 FINDINGS: Prior CABG. Mild cardiomegaly. No confluent airspace opacities, effusions or pneumothorax. No acute bony abnormality. IMPRESSION: Mild cardiomegaly.  No active disease.  Prior CABG. Electronically Signed   By: Rolm Baptise M.D.   On: 07/31/2017 11:52   Ct Angio Neck W Or Wo Contrast  Result Date: 08/01/2017 CLINICAL DATA:  77 year old male with carotid artery stenosis. Preoperative evaluation. EXAM: CT ANGIOGRAPHY NECK TECHNIQUE: Multidetector CT imaging of the neck was performed using the standard protocol during bolus administration of  intravenous contrast. Multiplanar CT image reconstructions and MIPs were obtained to evaluate the vascular anatomy. Carotid stenosis measurements (when applicable) are obtained utilizing NASCET criteria, using  the distal internal carotid diameter as the denominator. CONTRAST:  72m ISOVUE-370 IOPAMIDOL (ISOVUE-370) INJECTION 76% 13 hour steroid contrast premedication completed. The patient remained asymptomatic following contrast administration for this exam. COMPARISON:  None. FINDINGS: Skeleton: Prior sternotomy. No acute osseous abnormality identified. Upper chest: Prior CABG. No superior mediastinal lymphadenopathy. Negative visible lung parenchyma. Other neck: Negative.  No cervical lymphadenopathy. Negative visualized brain parenchyma. Visualized orbit soft tissues are within normal limits. Visible paranasal sinuses and mastoids are well pneumatized. Aortic arch: Prior CABG. Bovine type arch configuration. Circumferential soft plaque at the great vessel origins without significant stenosis. Right carotid system: No brachiocephalic artery or right CCA origin stenosis despite mostly soft plaque. Circumferential soft and mildly calcified plaque in the right CCA at the level of the larynx without stenosis. Similar circumferential soft plaque with scattered calcification at the right carotid bifurcation, right ICA origin and bulb. No cervical right ICA stenosis. Visible right ICA siphon is patent with minimal calcified plaque and no stenosis. Left carotid system: No left CCA origin stenosis. Circumferential soft plaque continues in the left CCA and becomes bulky along the posterior wall up the level of the thyroid. But this does not appear hemodynamically significant. Circumferential soft and calcified plaque at the left carotid bifurcation affecting both the ICA and ECA origins. Bulky plaque then at the distal left ICA bulb with radiographic string sign stenosis over Jaquawn Saffran segment of 8-9 mm (series 603, image 99).  Despite this the left ICA is patent, although the downstream left ICA caliber is diminished (3-4 mm diameter). The visible left ICA siphon is patent with abundant cavernous segment calcified plaque (series 4, image 124). Vertebral arteries: Proximal right subclavian artery stenosis estimated at up to 60-70% related to soft and calcified plaque (series 300, image 58). There is calcified plaque near the right vertebral artery origin which Zeev Deakins shared with the right thyrocervical trunk vessel. Only mild right vertebral origin stenosis suspected. The right vertebral artery is patent to the skullbase without additional stenosis. There is moderate to severe right vertebral V4 segment calcified plaque and stenosis (series 300, image 228) occurring proximal to the right PICA origin. But the right vertebral remains patent to the vertebrobasilar junction. Negative visible basilar artery. No significant proximal left subclavian artery despite soft and calcified plaque. Soft plaque at the left vertebral artery origin with only mild stenosis. Left V 1 mild calcified plaque and tortuosity without stenosis. The left vertebral artery is patent to the skullbase. The distal vertebral arteries are codominant. There is mild to moderate left V4 segment soft and calcified plaque. No significant stenosis proximal to the left PICA origin. Up to moderate distal left vertebral stenosis just proximal to the vertebrobasilar junction. Review of the MIP images confirms the above findings IMPRESSION: 1. Positive for High-grade RADIOGRAPHIC STRING SIGN stenosis of the Left ICA bulb over Janah Mcculloh segment of 8-9 mm. Patent but mildly diminished caliber of the left ICA distal to the stenosis. Visible left ICA siphon is patent with abundant cavernous segment calcified plaque. 2. No significant right carotid stenosis to the proximal siphon. 3. 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 No significant proximal left  subclavian stenosis. 4. Only mild bilateral vertebral artery origins stenosis, but moderate to severe vertebral V4 segment plaque and stenosis in the posterior fossa, worse on the right. 5. Widespread proximal great vessel and bilateral CCA circumferential soft plaque. Electronically Signed   By: HGenevie AnnM.D.   On: 08/01/2017 09:21  Microbiology: Recent Results (from the past 240 hour(s))  Surgical pcr screen     Status: None   Collection Time: 08/19/17 10:39 AM  Result Value Ref Range Status   MRSA, PCR NEGATIVE NEGATIVE Final   Staphylococcus aureus NEGATIVE NEGATIVE Final    Comment: (NOTE) The Xpert SA Assay (FDA approved for NASAL specimens in patients 64 years of age and older), is one component of Cash [facility] comprehensive surveillance program. It is not intended to diagnose infection nor to guide or monitor treatment.      Labs: Basic Metabolic Panel: Recent Labs  Lab 08/22/17 1800 08/23/17 0234 08/25/17 1025 08/26/17 0600 08/27/17 0536  NA  --  138 137 139 138  K  --  4.0 3.8 3.4* 3.7  CL  --  105 104 103 103  CO2  --  '23 24 28 27  '$ GLUCOSE  --  242* 209* 201* 204*  BUN  --  '17 20 19 19  '$ CREATININE 1.03 1.15 1.17 1.20 0.95  CALCIUM  --  8.4* 9.0 9.1 8.8*  MG  --   --   --  1.6*  --   PHOS  --   --   --  3.0  --    Liver Function Tests: Recent Labs  Lab 08/25/17 1025 08/26/17 0600  AST 21 15  ALT 13* 13*  ALKPHOS 71 68  BILITOT 0.7 0.6  PROT 6.4* 6.5  ALBUMIN 3.6 3.4*   No results for input(s): LIPASE, AMYLASE in the last 168 hours. No results for input(s): AMMONIA in the last 168 hours. CBC: Recent Labs  Lab 08/22/17 1800 08/23/17 0234 08/25/17 1025 08/26/17 0600 08/27/17 0536  WBC 13.0* 9.4 11.0* 10.0 10.1  NEUTROABS  --   --  6.8  --   --   HGB 12.0* 11.1* 11.4* 11.5* 11.6*  HCT 38.9* 36.5* 36.0* 36.5* 36.6*  MCV 90.9 90.3 90.7 90.3 89.9  PLT 256 278 293 269 291   Cardiac Enzymes: Recent Labs  Lab 08/25/17 1025  TROPONINI 0.03*   BNP: BNP  (last 3 results) Recent Labs    08/25/17 1025  BNP 2,945.1*    ProBNP (last 3 results) Recent Labs    06/18/17 0847  PROBNP 323.0*    CBG: Recent Labs  Lab 08/26/17 0719 08/26/17 1119 08/26/17 1641 08/26/17 2112 08/27/17 0722  GLUCAP 189* 162* 105* 198* 190*       Signed:  Fayrene Helper MD.  [facility] 08/27/2017, 11:44 PM

## 2017-08-27 NOTE — Care Management Note (Signed)
Case Management Note  Patient Details  Name: Larry [name] MRN: [id number] Date of Birth: 03/22/1941  Subjective/Objective:  Spoke to patient about d/c plans-he felt he will do outpatient cardiac rehab through his pcp once @ home. He doesn't feel he needs nay HHC. Was active w/[facility] Katrina rep-informed her patient declines any HHC.                  Action/Plan:d/c home.   Expected Discharge Date:  08/27/17               Expected Discharge Plan:  Home/Self Care  In-House Referral:     Discharge planning Services  CM Consult  Post Acute Care Choice:  Home Health([facility]) Choice offered to:     DME Arranged:    DME Agency:     HH Arranged:    HH Agency:     Status of Service:  Completed, signed off  If discussed at [employer] of Stay Meetings, dates discussed:    Additional Comments:  Dessa Phi, RN 08/27/2017, 12:14 PM

## 2017-08-27 NOTE — Consult Note (Signed)
   [facility] CM Inpatient Consult   08/27/2017  Larry [name] [birth year] [id number]    Patient screened for potential [facility] Care Management program due to recent readmission and CHF diagnosis.   Spoke with Larry [name] and wife at beside to discuss [place] Management services.   Larry [name] pleasantly declines [facility] Care Management. He reports he has close follow up with his Primary Care MD. Denies having any transportation, medication, or care coordination needs at this time.   Appreciative of the visit. Provided [facility] Care Management brochure and 24-nurse advice line magnet. States he would [facility] Management should he change his mind in the future.   Made inpatient RNCM aware that Larry [name] pleasantly declined [facility] Management services.    Marthenia Rolling, MSN-Ed, RN,BSN [facility] Liaison [phone]

## 2017-08-27 NOTE — Progress Notes (Signed)
Went over discharge papers with patient and family.  All questions answered.  VSS.  AVS given to patient.  Pt wheeled out via Therapist, sports.

## 2017-08-27 NOTE — Discharge Instructions (Signed)
Heart Failure Heart failure means your heart has trouble pumping blood. This makes it hard for your body to work well. Heart failure is usually Larry [name] long-term (chronic) condition. You must take good care of yourself and follow your doctor's treatment plan. Follow these instructions at home:  Take your heart medicine as told by your doctor. ? Do not stop taking medicine unless your doctor tells you to. ? Do not skip any dose of medicine. ? Refill your medicines before they run out. ? Take other medicines only as told by your doctor or pharmacist.  Stay active if told by your doctor. The elderly and people with severe heart failure should talk with Larry [name] doctor about physical activity.  Eat heart-healthy foods. Choose foods that are without trans fat and are low in saturated fat, cholesterol, and salt (sodium). This includes fresh or frozen fruits and vegetables, fish, lean meats, fat-free or low-fat dairy foods, whole grains, and high-fiber foods. Lentils and dried peas and beans (legumes) are also good choices.  Limit salt if told by your doctor.  Cook in Larry [name] healthy way. Roast, grill, broil, bake, poach, steam, or stir-fry foods.  Limit fluids as told by your doctor.  Weigh yourself every morning. Do this after you pee (urinate) and before you eat breakfast. Write down your weight to give to your doctor.  Take your blood pressure and write it down if your doctor tells you to.  Ask your doctor how to check your pulse. Check your pulse as told.  Lose weight if told by your doctor.  Stop smoking or chewing tobacco. Do not use gum or patches that help you quit without your doctor's approval.  Schedule and go to doctor visits as told.  Nonpregnant women should have no more than 1 drink Larry [name] day. Men should have no more than 2 drinks Larry [name] day. Talk to your doctor about drinking alcohol.  Stop illegal drug use.  Stay current with shots (immunizations).  Manage your health conditions as told by your  doctor.  Learn to manage your stress.  Rest when you are tired.  If it is really hot outside: ? Avoid intense activities. ? Use air conditioning or fans, or get in Larry [name] cooler place. ? Avoid caffeine and alcohol. ? Wear loose-fitting, lightweight, and light-colored clothing.  If it is really cold outside: ? Avoid intense activities. ? Layer your clothing. ? Wear mittens or gloves, Larry [name] hat, and Larry [name] scarf when going outside. ? Avoid alcohol.  Learn about heart failure and get support as needed.  Get help to maintain or improve your quality of life and your ability to care for yourself as needed. Contact Larry [name] doctor if:  You gain weight quickly.  You are more short of breath than usual.  You cannot do your normal activities.  You tire easily.  You cough more than normal, especially with activity.  You have any or more puffiness (swelling) in areas such as your hands, feet, ankles, or belly (abdomen).  You cannot sleep because it is hard to breathe.  You feel like your heart is beating fast (palpitations).  You get dizzy or light-headed when you stand up. Get help right away if:  You have trouble breathing.  There is Larry [name] change in mental status, such as becoming less alert or not being able to focus.  You have chest pain or discomfort.  Heart Failure Action Plan Larry [name] heart failure action plan helps you understand what to do when you have symptoms of heart  failure. Follow the plan that was created by you and your health care provider. Review your plan each time you visit your health care provider. Red zone These signs and symptoms mean you should get medical help right away: You have trouble breathing when resting. You have Larry [name] dry cough that is getting worse. You have swelling or pain in your legs or abdomen that is getting worse. You suddenly gain more than 2-3 lb (0.9-1.4 kg) in Larry [name] day, or more than 5 lb (2.3 kg) in one week. This amount may be more or less depending on your  condition. You have trouble staying awake or you feel confused. You have chest pain. You do not have an appetite. You pass out.  If you experience any of these symptoms: Call your local emergency services (911 in the [place] right away or seek help at the emergency department of the nearest hospital.  Yellow zone These signs and symptoms mean your condition may be getting worse and you should make some changes: You have trouble breathing when you are active or you need to sleep with extra pillows. You have swelling in your legs or abdomen. You gain 2-3 lb (0.9-1.4 kg) in one day, or 5 lb (2.3 kg) in one week. This amount may be more or less depending on your condition. You get tired easily. You have trouble sleeping. You have Larry [name] dry cough.  If you experience any of these symptoms: Contact your health care provider within the next day. Your health care provider may adjust your medicines.  Green zone These signs mean you are doing well and can continue what you are doing: You do not have shortness of breath. You have very little swelling or no new swelling. Your weight is stable (no gain or loss). You have Larry [name] normal activity level. You do not have chest pain or any other new symptoms.  Follow these instructions at home: Take over-the-counter and prescription medicines only as told by your health care provider. Weigh yourself daily. Your target weight is __________ lb (__________ kg). Call your health care provider if you gain more than __________ lb (__________ kg) in Larry [name] day, or more than __________ lb (__________ kg) in one week. Eat Larry [name] heart-healthy diet. Work with Larry [name] diet and nutrition specialist (dietitian) to create an eating plan that is best for you. Keep all follow-up visits as told by your health care provider. This is important. Where to find more information: American Heart Association: www.heart.org Summary Follow the action plan that was created by you and your health care  provider. Get help right away if you have any symptoms in the Red zone. This information is not intended to replace advice given to you by your health care provider. Make sure you discuss any questions you have with your health care provider. Document Released: 09/15/2016 Document Revised: 09/15/2016 Document Reviewed: 09/15/2016 Elsevier Interactive Patient Education  2018 [employer]. This information is not intended to replace advice given to you by your health care provider. Make sure you discuss any questions you have with your health care provider. Document Released: 05/15/2008 Document Revised: 01/12/2016 Document Reviewed: 09/22/2012 Elsevier Interactive Patient Education  2017 [employer].

## 2017-08-29 ENCOUNTER — Encounter: Payer: Self-pay | Admitting: Internal Medicine

## 2017-08-29 ENCOUNTER — Ambulatory Visit (INDEPENDENT_AMBULATORY_CARE_PROVIDER_SITE_OTHER): Payer: Medicare Other | Admitting: Internal Medicine

## 2017-08-29 DIAGNOSIS — I5021 Acute systolic (congestive) heart failure: Secondary | ICD-10-CM

## 2017-08-29 DIAGNOSIS — I2510 Atherosclerotic heart disease of native coronary artery without angina pectoris: Secondary | ICD-10-CM | POA: Diagnosis not present

## 2017-08-29 DIAGNOSIS — E118 Type 2 diabetes mellitus with unspecified complications: Secondary | ICD-10-CM | POA: Diagnosis not present

## 2017-08-29 DIAGNOSIS — E785 Hyperlipidemia, unspecified: Secondary | ICD-10-CM | POA: Diagnosis not present

## 2017-08-29 DIAGNOSIS — I10 Essential (primary) hypertension: Secondary | ICD-10-CM

## 2017-08-29 MED ORDER — GLIMEPIRIDE 2 MG PO TABS: 2.0000 mg | ORAL_TABLET | Freq: Two times a day (BID) | ORAL | 3 refills | Status: DC

## 2017-08-29 MED ORDER — METOPROLOL TARTRATE 50 MG PO TABS: 50.0000 mg | ORAL_TABLET | Freq: Two times a day (BID) | ORAL | 3 refills | Status: DC

## 2017-08-29 MED ORDER — LOSARTAN POTASSIUM 100 MG PO TABS: 100.0000 mg | ORAL_TABLET | Freq: Every day | ORAL | 3 refills | Status: DC

## 2017-08-29 MED ORDER — POTASSIUM CHLORIDE CRYS ER 20 MEQ PO TBCR: 20.0000 meq | EXTENDED_RELEASE_TABLET | Freq: Every day | ORAL | 3 refills | Status: DC

## 2017-08-29 MED ORDER — FUROSEMIDE 40 MG PO TABS: 40.0000 mg | ORAL_TABLET | Freq: Every day | ORAL | 3 refills | Status: DC

## 2017-08-29 NOTE — Progress Notes (Signed)
Subjective:  Patient ID: Larry [name], male    DOB: Oct 05, 1940  Age: 77 y.o. MRN: [id number]  CC: No chief complaint on file.   HPI Larry [name] presents for CAD/CABG, PAD, DM f/u CBGs are elevated: taking Amaryl qd only  F/u hospital stay for CHF - treated (d/c'd home on 08/27/16): "Larry [name] a 78 y.o.malewith medical history significant forCAD s/p CABG 2 months ago, carotid artery disease s/p endardectomy 1 week ago, obesity, type 2 diabetes, HLD, BPH who presented to [facility] with complaints of intermittent dyspnea at rest. States symptoms started about 1 week ago and are progressively getting worse to the point that he is unable to sustain minimal exertion. Denies chest pain or palpitations. Occasional dry cough. Good urine output with home diuresis.  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  Will d/c with 40 mg BID x 3 days then resume 40 mg daily.  Follow up with PCP and cardiology.  -he was on prn lasix then told to d/c this so hasn't taken it since CABG -strict I&Os -daily weight(he notes he weighs 246 at home) -heart healthy diet with salt restriction -fluid restriction -lasix 40 mg IV BID-> transition to 40 mg BID -last echo11/7/18 LVEF 40-45% -Will hold off on cardiology c/s for now given his sx much improved with IV lasix"    Outpatient Medications Prior to Visit  Medication Sig Dispense Refill  . ACCU-CHEK FASTCLIX LANCETS MISC 1 each by Does not apply route 2 (two) times daily. Dx:E11.9 100 each 5  . Ascorbic Acid (VITAMIN C) 1000 MG tablet Take 1,000 mg by mouth daily.    [name] aspirin EC 325 MG EC tablet Take 1 tablet (325 mg total) daily by mouth. 30 tablet 0  . Cholecalciferol (VITAMIN D3) 1000 UNITS CAPS Take 1,000 Units by mouth daily.     . furosemide (LASIX) 40 MG tablet Take 1 tablet (40 mg total) by mouth daily. (take twice daily for 3 days then daily) 30  tablet 0  . glimepiride (AMARYL) 2 MG tablet Take 1 tablet (2 mg total) by mouth 2 (two) times daily. (Patient taking differently: Take 2 mg by mouth daily with breakfast. ) 180 tablet 3  . glucose blood (ACCU-CHEK SMARTVIEW) test strip 1 each by Other route 2 (two) times daily. Use to check blood sugars twice a day Dx E11.9 100 each 3  . glucose blood test strip 1 each by Other route 2 (two) times daily. Please dispense brand of choice per pt and insurance preference. Dx:250.00 100 each 5  . glucose monitoring kit (FREESTYLE) monitoring kit 1 each by Does not apply route as needed for other. Please dispense brand of choice per ins/patient. Dx: 250.00. 1 each 0  . Krill Oil 1000 MG CAPS Take 1,000 mg by mouth daily.    [name] losartan (COZAAR) 100 MG tablet Take 1 tablet (100 mg total) by mouth daily. (Patient taking differently: Take 100 mg by mouth every evening. ) 90 tablet 3  . metFORMIN (GLUCOPHAGE) 1000 MG tablet Take 1 tablet (1,000 mg total) by mouth 2 (two) times daily with a meal. 180 tablet 3  . metoprolol tartrate (LOPRESSOR) 50 MG tablet Take 1 tablet (50 mg total) 2 (two) times daily by mouth. (Patient taking differently: Take 100 mg by mouth 2 (two) times daily. ) 60 tablet 3  . nitroGLYCERIN (NITROSTAT) 0.4 MG SL tablet  Place 1 tablet (0.4 mg total) under the tongue every 5 (five) minutes as needed for chest pain. 20 tablet 3  . oxyCODONE-acetaminophen (PERCOCET/ROXICET) 5-325 MG tablet Take 1 tablet by mouth every 6 (six) hours as needed for moderate pain. 8 tablet 0  . potassium chloride SA (K-DUR,KLOR-CON) 20 MEQ tablet Take 1 tablet (20 mEq total) by mouth daily. 30 tablet 0  . simvastatin (ZOCOR) 40 MG tablet Take 1 tablet (40 mg total) by mouth at bedtime. 90 tablet 3  . tamsulosin (FLOMAX) 0.4 MG CAPS capsule Take 1 capsule (0.4 mg total) daily by mouth. (Patient not taking: Reported on 08/29/2017) 30 capsule 0  . traMADol (ULTRAM) 50 MG tablet Take 1-2 tablets (50-100 mg total) every  4 (four) hours as needed by mouth for moderate pain. (Patient not taking: Reported on 08/29/2017) 30 tablet 0   No facility-administered medications prior to visit.    Pt lost 14 lbs   ROS Review of Systems  Constitutional: Negative for appetite change, fatigue and unexpected weight change.  HENT: Negative for congestion, nosebleeds, sneezing, sore throat and trouble swallowing.   Eyes: Negative for itching and visual disturbance.  Respiratory: Negative for cough.   Cardiovascular: Positive for leg swelling. Negative for chest pain and palpitations.  Gastrointestinal: Negative for abdominal distention, blood in stool, diarrhea and nausea.  Genitourinary: Negative for frequency and hematuria.  Musculoskeletal: Negative for back pain, gait problem, joint swelling and neck pain.  Skin: Negative for rash.  Neurological: Negative for dizziness, tremors, speech difficulty and weakness.  Psychiatric/Behavioral: Negative for agitation, dysphoric mood and sleep disturbance. The patient is not nervous/anxious.     Objective:  BP 132/68 (BP Location: Left Arm, Patient Position: Sitting, Cuff Size: Large)   Pulse 78   Temp 98.2 F (36.8 C) (Oral)   Ht 6' (1.829 m)   Wt 238 lb (108 kg)   SpO2 98%   BMI 32.28 kg/m   BP Readings from Last 3 Encounters:  08/29/17 132/68  08/27/17 (!) 154/91  08/23/17 (!) 144/84    Wt Readings from Last 3 Encounters:  08/29/17 238 lb (108 kg)  08/27/17 246 lb 14.6 oz (112 kg)  08/22/17 246 lb 4.8 oz (111.7 kg)    Physical Exam  Constitutional: He is oriented to person, place, and time. He appears well-developed. No distress.  NAD  HENT:  Mouth/Throat: Oropharynx is clear and moist.  Eyes: Conjunctivae are normal. Pupils are equal, round, and reactive to light.  Neck: Normal range of motion. No JVD present. No thyromegaly present.  Cardiovascular: Normal rate, regular rhythm, normal heart sounds and intact distal pulses. Exam reveals no gallop and no  friction rub.  No murmur heard. Pulmonary/Chest: Effort normal and breath sounds normal. No respiratory distress. He has no wheezes. He has no rales. He exhibits no tenderness.  Abdominal: Soft. Bowel sounds are normal. He exhibits no distension and no mass. There is no tenderness. There is no rebound and no guarding.  Musculoskeletal: Normal range of motion. He exhibits no edema or tenderness.  Lymphadenopathy:    He has no cervical adenopathy.  Neurological: He is alert and oriented to person, place, and time. He has normal reflexes. No cranial nerve deficit. He exhibits normal muscle tone. He displays a negative Romberg sign. Coordination and gait normal.  Skin: Skin is warm and dry. No rash noted.  Psychiatric: He has a normal mood and affect. His behavior is normal. Judgment and thought content normal.  L neck scar healing  Chest scar healed Ankle edema 1+ B  Lab Results  Component Value Date   WBC 10.1 08/27/2017   HGB 11.6 (L) 08/27/2017   HCT 36.6 (L) 08/27/2017   PLT 291 08/27/2017   GLUCOSE 204 (H) 08/27/2017   CHOL 128 08/06/2017   TRIG 117 08/06/2017   HDL 39 (L) 08/06/2017   LDLCALC 66 08/06/2017   ALT 13 (L) 08/26/2017   AST 15 08/26/2017   NA 138 08/27/2017   K 3.7 08/27/2017   CL 103 08/27/2017   CREATININE 0.95 08/27/2017   BUN 19 08/27/2017   CO2 27 08/27/2017   TSH 0.879 06/25/2017   PSA 0.00 Repeated and verified X2. (L) 12/26/2012   INR 1.01 08/19/2017   HGBA1C 6.2 (H) 08/25/2017    Dg Chest 2 View  Result Date: 08/25/2017 CLINICAL DATA:  Shortness of breath for several days. Patch angioplasty on 08/22/2017 and CABG on 06/27/2017. EXAM: CHEST  2 VIEW COMPARISON:  07/31/2017 and prior chest radiographs FINDINGS: Cardiomegaly and CABG changes noted. A trace right pleural effusion is noted. There is no evidence of airspace disease, edema or pneumothorax. No acute abnormalities are present. IMPRESSION: Trace right pleural effusion. Cardiomegaly and CABG  changes. Electronically Signed   By: Margarette Canada M.D.   On: 08/25/2017 10:54    Assessment & Plan:   There are no diagnoses linked to this encounter. I have discontinued Marily Lente. Corriveau's tamsulosin and traMADol. I am also having him maintain his Vitamin D3, glucose blood, glucose monitoring kit, ACCU-CHEK FASTCLIX LANCETS, metFORMIN, simvastatin, glimepiride, glucose blood, nitroGLYCERIN, vitamin C, aspirin, metoprolol tartrate, losartan, oxyCODONE-acetaminophen, Krill Oil, furosemide, and potassium chloride SA.  No orders of the defined types were placed in this encounter.    Follow-up: No Follow-up on file.  Walker Kehr, MD

## 2017-08-29 NOTE — Assessment & Plan Note (Signed)
On Metformin, Glimepiride - re-start BID

## 2017-08-29 NOTE — Assessment & Plan Note (Signed)
BP Readings from Last 3 Encounters:  08/29/17 132/68  08/27/17 (!) 154/91  08/23/17 (!) 144/84

## 2017-08-29 NOTE — Assessment & Plan Note (Signed)
Simvastatin 

## 2017-08-29 NOTE — Assessment & Plan Note (Signed)
S/p CABG 

## 2017-08-29 NOTE — Assessment & Plan Note (Signed)
Cont w/Lasix po qd KCl

## 2017-09-04 ENCOUNTER — Other Ambulatory Visit: Payer: Self-pay

## 2017-09-04 ENCOUNTER — Ambulatory Visit (INDEPENDENT_AMBULATORY_CARE_PROVIDER_SITE_OTHER): Payer: Medicare Other | Admitting: Surgery

## 2017-09-04 ENCOUNTER — Encounter: Payer: Self-pay | Admitting: Surgery

## 2017-09-04 VITALS — BP 115/61 | HR 97 | Temp 97.4°F | Resp 18 | Ht 72.0 in | Wt 230.0 lb

## 2017-09-04 DIAGNOSIS — I6522 Occlusion and stenosis of left carotid artery: Secondary | ICD-10-CM

## 2017-09-04 NOTE — Progress Notes (Signed)
Patient name: Larry [name] MRN: [id number] DOB: 04-02-1941 Sex: male  REASON FOR VISIT:     post op  HISTORY OF PRESENT ILLNESS:   Larry [name] is a 77 y.o. male who returns today for his first postoperative visit.  He is status post left carotid endarterectomy with patch angioplasty on 08/22/2017.  This was done for asymptomatic left carotid stenosis.  Intraoperative findings included a fair amount of inflammatory tissue around the internal carotid artery.  The disease went into the internal carotid artery approximately 2.5 cm, therefore a shunt could not be placed.  His postoperative course was uncomplicated he was discharged home.  He was readmitted for pulmonary edema which responded to Lasix.  The patient's carotid stenosis was detected during a preoperative workup for CABG.  CURRENT MEDICATIONS:    Current Outpatient Medications  Medication Sig Dispense Refill  . ACCU-CHEK FASTCLIX LANCETS MISC 1 each by Does not apply route 2 (two) times daily. Dx:E11.9 100 each 5  . aspirin EC 325 MG EC tablet Take 1 tablet (325 mg total) daily by mouth. 30 tablet 0  . Cholecalciferol (VITAMIN D3) 1000 UNITS CAPS Take 1,000 Units by mouth daily.     . furosemide (LASIX) 40 MG tablet Take 1 tablet (40 mg total) by mouth daily. (take twice daily for 3 days then daily) 90 tablet 3  . glimepiride (AMARYL) 2 MG tablet Take 1 tablet (2 mg total) by mouth 2 (two) times daily. 180 tablet 3  . glucose blood (ACCU-CHEK SMARTVIEW) test strip 1 each by Other route 2 (two) times daily. Use to check blood sugars twice a day Dx E11.9 100 each 3  . glucose blood test strip 1 each by Other route 2 (two) times daily. Please dispense brand of choice per pt and insurance preference. Dx:250.00 100 each 5  . glucose monitoring kit (FREESTYLE) monitoring kit 1 each by Does not apply route as needed for other. Please dispense brand of choice per ins/patient. Dx: 250.00. 1 each 0  .  losartan (COZAAR) 100 MG tablet Take 1 tablet (100 mg total) by mouth daily. 90 tablet 3  . metFORMIN (GLUCOPHAGE) 1000 MG tablet Take 1 tablet (1,000 mg total) by mouth 2 (two) times daily with a meal. 180 tablet 3  . metoprolol tartrate (LOPRESSOR) 50 MG tablet Take 1 tablet (50 mg total) by mouth 2 (two) times daily. 180 tablet 3  . nitroGLYCERIN (NITROSTAT) 0.4 MG SL tablet Place 1 tablet (0.4 mg total) under the tongue every 5 (five) minutes as needed for chest pain. 20 tablet 3  . potassium chloride SA (K-DUR,KLOR-CON) 20 MEQ tablet Take 1 tablet (20 mEq total) by mouth daily. 90 tablet 3  . simvastatin (ZOCOR) 40 MG tablet Take 1 tablet (40 mg total) by mouth at bedtime. 90 tablet 3  . oxyCODONE-acetaminophen (PERCOCET/ROXICET) 5-325 MG tablet Take 1 tablet by mouth every 6 (six) hours as needed for moderate pain. (Patient not taking: Reported on 09/04/2017) 8 tablet 0   No current facility-administered medications for this visit.     REVIEW OF SYSTEMS:   '[X]'$  denotes positive finding, '[ ]'$  denotes negative finding Cardiac  Comments:  Chest pain or chest pressure:    Shortness of breath upon exertion:    Short of breath when lying flat:    Irregular heart rhythm:    Constitutional    Fever or chills:      PHYSICAL EXAM:   Vitals:   09/04/17 1324 09/04/17 1328  BP: 116/64 115/61  Pulse: 76 97  Resp: 18   Temp: (!) 97.4 F (36.3 C)   TempSrc: Oral   SpO2: 97%   Weight: 230 lb (104.3 kg)   Height: 6' (1.829 m)     GENERAL: The patient is a well-nourished male, in no acute distress. The vital signs are documented above. CARDIOVASCULAR: There is a regular rate and rhythm. PULMONARY: Non-labored respirations Neurologically intact Carotid incision is healing nicely  STUDIES:   None   MEDICAL ISSUES:   Status post left carotid endarterectomy: Patient is doing very well at this time.  He is scheduled for follow-up in 6 months with a repeat carotid duplex.  [name], MD [facility] [phone] Pager [phone]

## 2017-09-06 ENCOUNTER — Ambulatory Visit: Payer: Medicare Other | Admitting: Internal Medicine

## 2017-09-09 ENCOUNTER — Other Ambulatory Visit: Payer: Self-pay | Admitting: Internal Medicine

## 2017-09-10 ENCOUNTER — Ambulatory Visit (INDEPENDENT_AMBULATORY_CARE_PROVIDER_SITE_OTHER): Payer: Medicare Other | Admitting: Nurse Practitioner

## 2017-09-10 ENCOUNTER — Encounter: Payer: Self-pay | Admitting: Nurse Practitioner

## 2017-09-10 VITALS — BP 132/80 | HR 67 | Ht 72.0 in | Wt 224.8 lb

## 2017-09-10 DIAGNOSIS — I259 Chronic ischemic heart disease, unspecified: Secondary | ICD-10-CM

## 2017-09-10 DIAGNOSIS — Z951 Presence of aortocoronary bypass graft: Secondary | ICD-10-CM | POA: Diagnosis not present

## 2017-09-10 DIAGNOSIS — E785 Hyperlipidemia, unspecified: Secondary | ICD-10-CM

## 2017-09-10 DIAGNOSIS — I10 Essential (primary) hypertension: Secondary | ICD-10-CM | POA: Diagnosis not present

## 2017-09-10 DIAGNOSIS — I5022 Chronic systolic (congestive) heart failure: Secondary | ICD-10-CM

## 2017-09-10 LAB — BASIC METABOLIC PANEL
BUN/Creatinine Ratio: 18 (ref 10–24)
BUN: 23 mg/dL (ref 8–27)
CO2: 25 mmol/L (ref 20–29)
Calcium: 10.2 mg/dL (ref 8.6–10.2)
Chloride: 102 mmol/L (ref 96–106)
Creatinine, Ser: 1.27 mg/dL (ref 0.76–1.27)
GFR calc Af Amer: 63 mL/min/{1.73_m2} (ref >59)
GFR calc non Af Amer: 55 mL/min/{1.73_m2} — ABNORMAL LOW (ref >59)
Glucose: 137 mg/dL — ABNORMAL HIGH (ref 65–99)
Potassium: 4.5 mmol/L (ref 3.5–5.2)
Sodium: 141 mmol/L (ref 134–144)

## 2017-09-10 LAB — PRO B NATRIURETIC PEPTIDE: NT-Pro BNP: 5443 pg/mL — ABNORMAL HIGH (ref 0–486)

## 2017-09-10 MED ORDER — FUROSEMIDE 40 MG PO TABS: 40.0000 mg | ORAL_TABLET | Freq: Every day | ORAL | 3 refills | Status: DC

## 2017-09-10 NOTE — Patient Instructions (Addendum)
We will be checking the following labs today - BMET and BNP   Medication Instructions:    Continue with your current medicines.   Ok to take an extra dose of your lasix if your weight goes up more than 2 or 3 pounds overnight.     Testing/Procedures To Be Arranged:  Limited echo after February 8th  Follow-Up:   See Dr. Tamala Julian as planned next month    Other Special Instructions:   Continue to weigh daily  Continue to restrict your salt use  Keep monitoring your BP     If you need a refill on your cardiac medications before your next appointment, please call your pharmacy.   Call the [place] office at [phone] if you have any questions, problems or concerns.

## 2017-09-10 NOTE — Progress Notes (Signed)
CARDIOLOGY OFFICE NOTE  Date:  09/10/2017    Larry [name] Date of Birth: 1941-08-12 Medical Record #[id number]  PCP:  Cassandria Anger, MD  Cardiologist:  Jennings Books    Chief Complaint  Patient presents with  . Congestive Heart Failure    Post hospital visit - seen for Dr. Tamala Julian    History of Present Illness: Larry [name] is a 77 y.o. male who presents today for a post hospital visit.Seen for Dr. Tamala Julian.   He has ahistory of HTN,DM, HLD, and OSA.  Seen here backat the end ofOctober with progressive exertional fatigue and chest tightness.He was started onASA and prn NTG. Cardiac cath was recommended.This was done on 06/24/2017 and revealed multivessel CAD with a reduced EF of 35-40%. Grade 1 diastolic dysfunction noted as well.  He was taken to the operating room and underwent CABG x 4per PVTutilizing LIMA to LAD, SVG to Ramus Intermediate, SVG to OM, and SVG to RCA. He also underwent endoscopic harvest greater saphenous vein from his right leg. He required cystoscopy with foley placement. He was aggressively diuresed for hypervolemia. He was hypertensive and restarted on home ARB.He maintainedNSR. Noted to have high grade carotid stenosis - to be addressed at later date by Dr. Trula Slade.  I have seen him back a couple of times post op - he has done well. Was going back to [facility] after the holidays to get his left CEA. ARB was increased for his BP and low EF.   Presented earlier this month - just a few days after his left CEA - with shortness of breath to the point that was not able to sustain even minimal activity. Treated for CHF - EF of 45% by echo - diuresed well. Patient was NOT seen by cardiology during that admission.   Comes in today. Here with his wife today. He is doing quite well. He admits to have excessive sodium over Christmas. Loved potato chips and did have ham. Now doing better with salt restriction. Not short of breath. No  chest pain. Swelling has improved - wearing support stockings. Back on lasix - now just once a day. Weight is staying around 225 at home.   Past Medical History:  Diagnosis Date  . Carotid stenosis, left   . Cataracts, bilateral    removed  . Coronary artery disease   . Hyperlipemia   . Prostate CA ([place])   . Sleep apnea    Dr Ledora Bottcher ; uses CPAP  . Type II or unspecified type diabetes mellitus without mention of complication, not stated as uncontrolled   . Unspecified essential hypertension     Past Surgical History:  Procedure Laterality Date  . APPENDECTOMY    . CATARACT EXTRACTION, BILATERAL    . CORONARY ARTERY BYPASS GRAFT N/A 06/27/2017   Procedure: CORONARY ARTERY BYPASS GRAFTING (CABG), time four   using the left internal mammary artery, and right saphenous leg vein harvested endoscopically.;  Surgeon: Ivin Poot, MD;  Location: [facility];  Service: Open Heart Surgery;  Laterality: N/A;  . CYSTOSCOPY N/A 06/27/2017   Procedure: CYSTOSCOPY FLEXIBLE, Balloon dilatation placement of foley catheter;  Surgeon: Ivin Poot, MD;  Location: [facility];  Service: Open Heart Surgery;  Laterality: N/A;  . ENDARTERECTOMY Left 08/22/2017   Procedure: ENDARTERECTOMY CAROTID LEFT;  Surgeon: Serafina Mitchell, MD;  Location: [facility];  Service: Vascular;  Laterality: Left;  . INTRAVASCULAR ULTRASOUND/IVUS N/A 06/24/2017   Procedure: Intravascular Ultrasound/IVUS;  Surgeon:  Belva Crome, MD;  Location: [facility];  Service: Cardiovascular;  Laterality: N/A;  LEFT MAIN  . LEFT HEART CATH AND CORONARY ANGIOGRAPHY N/A 06/24/2017   Procedure: LEFT HEART CATH AND CORONARY ANGIOGRAPHY;  Surgeon: Belva Crome, MD;  Location: [facility];  Service: Cardiovascular;  Laterality: N/A;  . PATCH ANGIOPLASTY Left 08/22/2017   Procedure: PATCH ANGIOPLASTY USING Rueben Bash BIOLOGIC PATCH;  Surgeon: Serafina Mitchell, MD;  Location: [facility];  Service: Vascular;  Laterality: Left;  . PROSTATECTOMY    . TEE  WITHOUT CARDIOVERSION N/A 06/27/2017   Procedure: TRANSESOPHAGEAL ECHOCARDIOGRAM (TEE);  Surgeon: Prescott Gum, Collier Salina, MD;  Location: [facility];  Service: Open Heart Surgery;  Laterality: N/A;  . TONSILLECTOMY    . ULTRASOUND GUIDANCE FOR VASCULAR ACCESS  06/24/2017   Procedure: Ultrasound Guidance For Vascular Access;  Surgeon: Belva Crome, MD;  Location: [facility];  Service: Cardiovascular;;     Medications: Current Meds  Medication Sig  . ACCU-CHEK FASTCLIX LANCETS MISC 1 each by Does not apply route 2 (two) times daily. Dx:E11.9  . aspirin EC 325 MG EC tablet Take 1 tablet (325 mg total) daily by mouth.  . Cholecalciferol (VITAMIN D3) 1000 UNITS CAPS Take 1,000 Units by mouth daily.   . furosemide (LASIX) 40 MG tablet Take 1 tablet (40 mg total) by mouth daily. (take twice daily for 3 days then daily) (Patient taking differently: Take 40 mg by mouth daily. )  . glimepiride (AMARYL) 2 MG tablet Take 1 tablet (2 mg total) by mouth 2 (two) times daily.  [name] glucose blood (ACCU-CHEK SMARTVIEW) test strip 1 each by Other route 2 (two) times daily. Use to check blood sugars twice a day Dx E11.9  . glucose blood test strip 1 each by Other route 2 (two) times daily. Please dispense brand of choice per pt and insurance preference. Dx:250.00  . glucose monitoring kit (FREESTYLE) monitoring kit 1 each by Does not apply route as needed for other. Please dispense brand of choice per ins/patient. Dx: 250.00.  [name] losartan (COZAAR) 100 MG tablet Take 1 tablet (100 mg total) by mouth daily.  . metFORMIN (GLUCOPHAGE) 1000 MG tablet Take 1 tablet (1,000 mg total) by mouth 2 (two) times daily with a meal.  . metoprolol tartrate (LOPRESSOR) 50 MG tablet Take 1 tablet (50 mg total) by mouth 2 (two) times daily.  . nitroGLYCERIN (NITROSTAT) 0.4 MG SL tablet Place 1 tablet (0.4 mg total) under the tongue every 5 (five) minutes as needed for chest pain.  [name] oxyCODONE-acetaminophen (PERCOCET/ROXICET) 5-325 MG  tablet Take 1 tablet by mouth every 6 (six) hours as needed for moderate pain.  . potassium chloride SA (K-DUR,KLOR-CON) 20 MEQ tablet Take 1 tablet (20 mEq total) by mouth daily.  . simvastatin (ZOCOR) 40 MG tablet Take 1 tablet (40 mg total) by mouth at bedtime.     Allergies: Allergies  Allergen Reactions  . Amlodipine Swelling    Leg swelling  . Verapamil Swelling    Edema w/high dose  . Clindamycin Hcl     UNSPECIFIED REACTION   . Contrast Media [Iodinated Diagnostic Agents] Nausea And Vomiting    Social History: The patient  reports that  has never smoked. he has never used smokeless tobacco. He reports that he does not drink alcohol or use drugs.   Family History: The patient's family history includes Diabetes in his unknown relative; Heart disease in his mother; Hypertension in his unknown relative.  Review of Systems: Please see the history of present illness.   Otherwise, the review of systems is positive for none.   All other systems are reviewed and negative.   Physical Exam: VS:  BP 132/80 (BP Location: Left Arm, Patient Position: Sitting, Cuff Size: Normal)   Pulse 67   Ht 6' (1.829 m)   Wt 224 lb 12.8 oz (102 kg)   SpO2 97% Comment: at rest  BMI 30.49 kg/m  .  BMI Body mass index is 30.49 kg/m.  Wt Readings from Last 3 Encounters:  09/10/17 224 lb 12.8 oz (102 kg)  09/04/17 230 lb (104.3 kg)  08/29/17 238 lb (108 kg)    General: Pleasant. Well developed, well nourished and in no acute distress.   HEENT: Normal. L CEA scar looks good.  Neck: Supple, no JVD, carotid bruits, or masses noted.  Cardiac: Regular rate and rhythm. No murmurs, rubs, or gallops. No edema. Has his support stockings in place.  Respiratory:  Lungs are clear to auscultation bilaterally with normal work of breathing.  GI: Soft and nontender.  MS: No deformity or atrophy. Gait and ROM intact.  Skin: Warm and dry. Color is normal.  Neuro:  Strength and sensation are intact and no  gross focal deficits noted.  Psych: Alert, appropriate and with normal affect.   LABORATORY DATA:  EKG:  EKG is not ordered today.   Lab Results  Component Value Date   WBC 10.1 08/27/2017   HGB 11.6 (L) 08/27/2017   HCT 36.6 (L) 08/27/2017   PLT 291 08/27/2017   GLUCOSE 204 (H) 08/27/2017   CHOL 128 08/06/2017   TRIG 117 08/06/2017   HDL 39 (L) 08/06/2017   LDLCALC 66 08/06/2017   ALT 13 (L) 08/26/2017   AST 15 08/26/2017   NA 138 08/27/2017   K 3.7 08/27/2017   CL 103 08/27/2017   CREATININE 0.95 08/27/2017   BUN 19 08/27/2017   CO2 27 08/27/2017   TSH 0.879 06/25/2017   PSA 0.00 Repeated and verified X2. (L) 12/26/2012   INR 1.01 08/19/2017   HGBA1C 6.2 (H) 08/25/2017     BNP (last 3 results) Recent Labs    08/25/17 1025  BNP 2,945.1*    ProBNP (last 3 results) Recent Labs    06/18/17 0847  PROBNP 323.0*     Other Studies Reviewed Today:  Significant Diagnostic Studies:Angiography11/2018:   Significant ostial left main in the 50-70% range. Attempted intravascular ultrasound but the probe would not pass beyond the ostium of the left main. Selective engagement produced ventricularization and pressure damping with reproduction of symptoms that the patient has clinically.  75-80% calcified mid LAD stenosis and 90+ percent stenosis in the second diagonal.  Moderate first and second obtuse marginal stenoses.  Dominant right coronary with eccentric 40-50% mid vessel narrowing.  Left ventricular global hypokinesis with estimated EF 35-40%. Elevated end-diastolic pressure consistent with chronic combined systolic and diastolic heart failure. LV dysfunction may be out of proportion for the coronary disease identified.  Treatments:surgery:  1. Coronary artery bypass grafting x4 (left internal mammary artery to LAD, saphenous vein graft to OM, saphenous vein graft to ramusintermediate, saphenous vein graft to RCA). 2. Endoscopic harvest of right  leg greater saphenous vein.   EchoStudy Conclusions11/2018  - Left ventricle: The cavity size was mildly dilated. There was mild concentric hypertrophy. Systolic function was mildly to moderately reduced. The estimated ejection fraction was in the range of 40% to 45%. Hypokinesis of the anteroseptal, anterior,  and anterolateral myocardium. Doppler parameters are consistent with abnormal left ventricular relaxation (grade 1 diastolic dysfunction). Doppler parameters are consistent with high ventricular filling pressure. - Aortic valve: Transvalvular velocity was within the normal range. There was no stenosis. There was no regurgitation. - Mitral valve: Transvalvular velocity was within the normal range. There was no evidence for stenosis. There was no regurgitation. - Right ventricle: The cavity size was normal. Wall thickness was normal. Systolic function was normal. - Tricuspid valve: There was mild regurgitation. - Pulmonary arteries: Systolic pressure was within the normal range. PA peak pressure: 20 mm Hg (S).  Pre-CABG doppler studyFinal Interpretation11/2018: Right Carotid: There is evidence in the right ICA of a 40-59% stenosis.  Left Carotid: There is evidence in the left ICA of a 80-99% stenosis. Vertebrals: Both vertebral arteries were patent with antegrade flow. Subclavians: Normal flow hemodynamics were seen in bilateral subclavian       arteries.  Right ABI: Resting right ankle-brachial index indicates mild right lower extremity arterial disease. The right toe-brachial index is normal. Left ABI: Resting left ankle-brachial index is within normal range. No evidence of significant left lower extremity arterial disease. The left toe-brachial index is normal.  Palmar arch evaluation - Doppler waveforms remained normal bilaterally with both radial and ulnar compressions.  Electronically signed by Harold Barban on 06/26/2017 at 5:10:05  PM.  Assessment/Plan:  1. Acute episode of systolic HF - most likely from excessive sodium intake - now doing much better. Weight is down. No real symptoms. On ARB and beta blocker along with his Lasix. Advised continued daily weights, salt restriction and to take extra dose of Lasix prn weight gain of 3 pounds overnight. Will get a limited echo after February 8th to see where his EF is now that he has been revascularized. He understands the importance of salt restriction.   2. Severe CAD - s/p CABGx 4 - doing well. About 2 1/2 months out. He is doing well. Will send message to cardiac rehab.   3. LV dysfunction/chronic systolic& diastolicHF- on ARB and beta blocker along with diuretic. ARB increased at last visit with me. May consider Aldactone. Will see what his lab show.   3. HTN- BP ok here today. He will continue to monitor - we do have room to add Aldactone if needed.   4. High grade carotid disease- now s/p L CEA  5. HLD- on statin therapy  6. DM- managed by PCP  7. CKD- lab today  Current medicines are reviewed with the patient today.  The patient does not have concerns regarding medicines other than what has been noted above.  The following changes have been made:  See above.  Labs/ tests ordered today include:    Orders Placed This Encounter  Procedures  . Basic metabolic panel  . Pro b natriuretic peptide (BNP)  . ECHOCARDIOGRAM LIMITED     Disposition:   FU with Dr. Tamala Julian as planned next month.    Patient is agreeable to this plan and will call if any problems develop in the interim.   SignedTruitt Merle, NP  09/10/2017 10:47 AM  [facility] [address] [place], [place]  [address] Phone: [phone] Fax: [phone]

## 2017-09-10 NOTE — Addendum Note (Signed)
Addended by: Lianne Cure A on: 09/10/2017 11:11 AM   Modules accepted: Orders

## 2017-09-11 ENCOUNTER — Other Ambulatory Visit: Payer: Self-pay | Admitting: *Deleted

## 2017-09-11 DIAGNOSIS — I5041 Acute combined systolic (congestive) and diastolic (congestive) heart failure: Secondary | ICD-10-CM

## 2017-09-16 ENCOUNTER — Encounter: Payer: Medicare Other | Admitting: Surgery

## 2017-09-18 ENCOUNTER — Ambulatory Visit (INDEPENDENT_AMBULATORY_CARE_PROVIDER_SITE_OTHER): Payer: Self-pay | Admitting: Cardiothoracic Surgery

## 2017-09-18 ENCOUNTER — Encounter: Payer: Self-pay | Admitting: Cardiothoracic Surgery

## 2017-09-18 ENCOUNTER — Other Ambulatory Visit: Payer: Medicare Other | Admitting: *Deleted

## 2017-09-18 VITALS — BP 125/62 | HR 76 | Resp 20 | Ht 72.0 in | Wt 224.0 lb

## 2017-09-18 DIAGNOSIS — I5041 Acute combined systolic (congestive) and diastolic (congestive) heart failure: Secondary | ICD-10-CM

## 2017-09-18 DIAGNOSIS — Z951 Presence of aortocoronary bypass graft: Secondary | ICD-10-CM

## 2017-09-18 NOTE — Progress Notes (Signed)
PCP is Plotnikov, [name], MD Referring Provider is Belva Crome, MD  Chief Complaint  Patient presents with  . Routine Post Op    6 week f/u    HPI: Final office visit for surgical follow-up almost 3 months after multivessel CABG. Patient presented with severe three-vessel CAD and moderate LV dysfunction. He did well after surgery but was found have a 90% left carotid stenosis which underwent endarterectomy Dr.Brabham last month.he did well following the endarterectomy also.  Patient currently is at his lowest weight in several years and feels great. He is anxious to resume golfing which will be fine after mid February. He will also be starting outpatient cardiac rehabilitation at Hospital.  He currently denies any problems with angina or symptoms of CHF. Past Medical History:  Diagnosis Date  . Carotid stenosis, left   . Cataracts, bilateral    removed  . Coronary artery disease   . Hyperlipemia   . Prostate CA ([place])   . Sleep apnea    Dr Ledora Bottcher ; uses CPAP  . Type II or unspecified type diabetes mellitus without mention of complication, not stated as uncontrolled   . Unspecified essential hypertension     Past Surgical History:  Procedure Laterality Date  . APPENDECTOMY    . CATARACT EXTRACTION, BILATERAL    . CORONARY ARTERY BYPASS GRAFT N/A 06/27/2017   Procedure: CORONARY ARTERY BYPASS GRAFTING (CABG), time four   using the left internal mammary artery, and right saphenous leg vein harvested endoscopically.;  Surgeon: Ivin Poot, MD;  Location: [facility];  Service: Open Heart Surgery;  Laterality: N/A;  . CYSTOSCOPY N/A 06/27/2017   Procedure: CYSTOSCOPY FLEXIBLE, Balloon dilatation placement of foley catheter;  Surgeon: Ivin Poot, MD;  Location: [facility];  Service: Open Heart Surgery;  Laterality: N/A;  . ENDARTERECTOMY Left 08/22/2017   Procedure: ENDARTERECTOMY CAROTID LEFT;  Surgeon: Serafina Mitchell, MD;  Location: [facility];  Service: Vascular;  Laterality: Left;   . INTRAVASCULAR ULTRASOUND/IVUS N/A 06/24/2017   Procedure: Intravascular Ultrasound/IVUS;  Surgeon: Belva Crome, MD;  Location: [facility];  Service: Cardiovascular;  Laterality: N/A;  LEFT MAIN  . LEFT HEART CATH AND CORONARY ANGIOGRAPHY N/A 06/24/2017   Procedure: LEFT HEART CATH AND CORONARY ANGIOGRAPHY;  Surgeon: Belva Crome, MD;  Location: [facility];  Service: Cardiovascular;  Laterality: N/A;  . PATCH ANGIOPLASTY Left 08/22/2017   Procedure: PATCH ANGIOPLASTY USING Rueben Bash BIOLOGIC PATCH;  Surgeon: Serafina Mitchell, MD;  Location: [facility];  Service: Vascular;  Laterality: Left;  . PROSTATECTOMY    . TEE WITHOUT CARDIOVERSION N/A 06/27/2017   Procedure: TRANSESOPHAGEAL ECHOCARDIOGRAM (TEE);  Surgeon: Prescott Gum, Collier Salina, MD;  Location: [facility];  Service: Open Heart Surgery;  Laterality: N/A;  . TONSILLECTOMY    . ULTRASOUND GUIDANCE FOR VASCULAR ACCESS  06/24/2017   Procedure: Ultrasound Guidance For Vascular Access;  Surgeon: Belva Crome, MD;  Location: [facility];  Service: Cardiovascular;;    Family History  Problem Relation Age of Onset  . Heart disease Mother   . Diabetes Unknown   . Hypertension Unknown   . Colon cancer Neg Hx     Social History Social History   Tobacco Use  . Smoking status: Never Smoker  . Smokeless tobacco: Never Used  Substance Use Topics  . Alcohol use: No    Alcohol/week: 0.0 oz  . Drug use: No    Current Outpatient Medications  Medication Sig Dispense Refill  .  ACCU-CHEK FASTCLIX LANCETS MISC 1 each by Does not apply route 2 (two) times daily. Dx:E11.9 100 each 5  . aspirin EC 325 MG EC tablet Take 1 tablet (325 mg total) daily by mouth. 30 tablet 0  . Cholecalciferol (VITAMIN D3) 1000 UNITS CAPS Take 1,000 Units by mouth daily.     . furosemide (LASIX) 40 MG tablet Take 1 tablet (40 mg total) by mouth daily. 90 tablet 3  . glimepiride (AMARYL) 2 MG tablet Take 1 tablet (2 mg total) by mouth 2 (two) times daily. 180  tablet 3  . glucose blood (ACCU-CHEK SMARTVIEW) test strip 1 each by Other route 2 (two) times daily. Use to check blood sugars twice a day Dx E11.9 100 each 3  . glucose blood test strip 1 each by Other route 2 (two) times daily. Please dispense brand of choice per pt and insurance preference. Dx:250.00 100 each 5  . glucose monitoring kit (FREESTYLE) monitoring kit 1 each by Does not apply route as needed for other. Please dispense brand of choice per ins/patient. Dx: 250.00. 1 each 0  . losartan (COZAAR) 100 MG tablet Take 1 tablet (100 mg total) by mouth daily. 90 tablet 3  . metFORMIN (GLUCOPHAGE) 1000 MG tablet TAKE 1 TABLET BY MOUTH TWICE DAILY WITH MEALS 180 tablet 3  . metoprolol tartrate (LOPRESSOR) 50 MG tablet Take 1 tablet (50 mg total) by mouth 2 (two) times daily. 180 tablet 3  . potassium chloride SA (K-DUR,KLOR-CON) 20 MEQ tablet Take 1 tablet (20 mEq total) by mouth daily. 90 tablet 3  . simvastatin (ZOCOR) 40 MG tablet Take 1 tablet (40 mg total) by mouth at bedtime. 90 tablet 3  . nitroGLYCERIN (NITROSTAT) 0.4 MG SL tablet Place 1 tablet (0.4 mg total) under the tongue every 5 (five) minutes as needed for chest pain. (Patient not taking: Reported on 09/18/2017) 20 tablet 3  . oxyCODONE-acetaminophen (PERCOCET/ROXICET) 5-325 MG tablet Take 1 tablet by mouth every 6 (six) hours as needed for moderate pain. (Patient not taking: Reported on 09/18/2017) 8 tablet 0   No current facility-administered medications for this visit.     Allergies  Allergen Reactions  . Amlodipine Swelling    Leg swelling  . Verapamil Swelling    Edema w/high dose  . Clindamycin Hcl     UNSPECIFIED REACTION   . Contrast Media [Iodinated Diagnostic Agents] Nausea And Vomiting    Review of Systems   No active complaints  BP 125/62   Pulse 76   Resp 20   Ht 6' (1.829 m)   Wt 224 lb (101.6 kg)   SpO2 98% Comment: RA  BMI 30.38 kg/m  Physical Exam      Exam    General- alert and  comfortable    Neck- no JVD, no cervical adenopathy palpable, no carotid bruit   Lungs- clear without rales, wheezes   Cor- regular rate and rhythm, no murmur , gallop   Abdomen- soft, non-tender   Extremities - warm, non-tender, minimal edema   Neuro- oriented, appropriate, no focal weakness   Diagnostic Tests: None today  Impression: Excellent recovery after multivessel CABG almost 3 months ago. After February 8 he will have no restrictions on his activities. He understands the importance of compliance with medications and a heart healthy diet and lifestyle. Plan:return as needed.   Len Childs, MD [facility] [phone]

## 2017-09-19 ENCOUNTER — Telehealth: Payer: Self-pay | Admitting: Interventional Cardiology

## 2017-09-19 ENCOUNTER — Other Ambulatory Visit: Payer: Self-pay | Admitting: *Deleted

## 2017-09-19 DIAGNOSIS — I5022 Chronic systolic (congestive) heart failure: Secondary | ICD-10-CM

## 2017-09-19 LAB — BASIC METABOLIC PANEL
BUN/Creatinine Ratio: 25 — ABNORMAL HIGH (ref 10–24)
BUN: 28 mg/dL — ABNORMAL HIGH (ref 8–27)
CO2: 27 mmol/L (ref 20–29)
Calcium: 10.1 mg/dL (ref 8.6–10.2)
Chloride: 101 mmol/L (ref 96–106)
Creatinine, Ser: 1.14 mg/dL (ref 0.76–1.27)
GFR calc Af Amer: 72 mL/min/{1.73_m2} (ref >59)
GFR calc non Af Amer: 63 mL/min/{1.73_m2} (ref >59)
Glucose: 122 mg/dL — ABNORMAL HIGH (ref 65–99)
Potassium: 5 mmol/L (ref 3.5–5.2)
Sodium: 142 mmol/L (ref 134–144)

## 2017-09-19 LAB — PRO B NATRIURETIC PEPTIDE: NT-Pro BNP: 3601 pg/mL — ABNORMAL HIGH (ref 0–486)

## 2017-09-19 MED ORDER — POTASSIUM CHLORIDE ER 10 MEQ PO TBCR: 10.0000 meq | EXTENDED_RELEASE_TABLET | Freq: Every day | ORAL | 3 refills | Status: DC

## 2017-09-19 MED ORDER — SPIRONOLACTONE 25 MG PO TABS: 12.5000 mg | ORAL_TABLET | Freq: Every day | ORAL | 3 refills | Status: DC

## 2017-09-19 NOTE — Telephone Encounter (Signed)
New message ° ° °Patient calling for lab results. Please call °

## 2017-09-19 NOTE — Telephone Encounter (Signed)
I spoke with pt and reviewed lab results and instructions from Dr. Tamala Julian with him. Will send prescriptions to [facility] in [place]. He will come in for BMP on 09/27/17

## 2017-09-23 ENCOUNTER — Telehealth (HOSPITAL_COMMUNITY): Payer: Self-pay

## 2017-09-23 NOTE — Telephone Encounter (Signed)
Called to speak with patient in regards to cardiac rehab - patient is currently is [place]. Explained what Cardiac Rehab is all about to patient. He would like to think about it and give me a call back. If no phone call, will follow up in a week.

## 2017-09-27 ENCOUNTER — Other Ambulatory Visit: Payer: Medicare Other | Admitting: *Deleted

## 2017-09-27 DIAGNOSIS — I5022 Chronic systolic (congestive) heart failure: Secondary | ICD-10-CM | POA: Diagnosis not present

## 2017-09-27 LAB — BASIC METABOLIC PANEL
BUN / CREAT RATIO: 22 (ref 10–24)
BUN: 28 mg/dL — ABNORMAL HIGH (ref 8–27)
CO2: 24 mmol/L (ref 20–29)
Calcium: 9.9 mg/dL (ref 8.6–10.2)
Chloride: 102 mmol/L (ref 96–106)
Creatinine, Ser: 1.25 mg/dL (ref 0.76–1.27)
GFR, EST AFRICAN AMERICAN: 65 mL/min/{1.73_m2} (ref >59)
GFR, EST NON AFRICAN AMERICAN: 56 mL/min/{1.73_m2} — AB (ref >59)
Glucose: 211 mg/dL — ABNORMAL HIGH (ref 65–99)
Potassium: 4.9 mmol/L (ref 3.5–5.2)
SODIUM: 141 mmol/L (ref 134–144)

## 2017-09-30 ENCOUNTER — Telehealth: Payer: Self-pay | Admitting: Interventional Cardiology

## 2017-09-30 ENCOUNTER — Ambulatory Visit (HOSPITAL_COMMUNITY): Payer: Medicare Other | Attending: Cardiology

## 2017-09-30 ENCOUNTER — Telehealth (HOSPITAL_COMMUNITY): Payer: Self-pay

## 2017-09-30 ENCOUNTER — Other Ambulatory Visit: Payer: Self-pay

## 2017-09-30 DIAGNOSIS — E119 Type 2 diabetes mellitus without complications: Secondary | ICD-10-CM | POA: Insufficient documentation

## 2017-09-30 DIAGNOSIS — I2510 Atherosclerotic heart disease of native coronary artery without angina pectoris: Secondary | ICD-10-CM | POA: Insufficient documentation

## 2017-09-30 DIAGNOSIS — G4733 Obstructive sleep apnea (adult) (pediatric): Secondary | ICD-10-CM | POA: Diagnosis not present

## 2017-09-30 DIAGNOSIS — I5022 Chronic systolic (congestive) heart failure: Secondary | ICD-10-CM | POA: Insufficient documentation

## 2017-09-30 DIAGNOSIS — Z951 Presence of aortocoronary bypass graft: Secondary | ICD-10-CM | POA: Diagnosis not present

## 2017-09-30 DIAGNOSIS — I110 Hypertensive heart disease with heart failure: Secondary | ICD-10-CM | POA: Insufficient documentation

## 2017-09-30 DIAGNOSIS — E785 Hyperlipidemia, unspecified: Secondary | ICD-10-CM | POA: Insufficient documentation

## 2017-09-30 LAB — ECHOCARDIOGRAM LIMITED
Ao-asc: 30 cm
E decel time: 254 msec
E/e' ratio: 6.52
FS: 29 % (ref 28–44)
IVS/LV PW RATIO, ED: 1.22
LA ID, A-P, ES: 53 mm
LA diam end sys: 53 mm
LA diam index: 2.37 cm/m2
LA vol A4C: 73.5 ml
LA vol index: 33.6 mL/m2
LA vol: 75.2 mL
LV E/e' medial: 6.52
LV E/e'average: 6.52
LV PW d: 10.6 mm — AB (ref 0.6–1.1)
LV e' LATERAL: 8.92 cm/s
LVOT SV: 93 mL
LVOT VTI: 17.6 cm
LVOT area: 5.31 cm2
LVOT diameter: 26 mm
LVOT peak vel: 82 cm/s
Lateral S' vel: 8.59 cm/s
MV Dec: 254
MV pk A vel: 74.7 m/s
MV pk E vel: 58.2 m/s
Reg peak vel: 236 cm/s
TAPSE: 11.3 mm
TDI e' lateral: 8.92
TDI e' medial: 5.87
TR max vel: 236 cm/s

## 2017-09-30 NOTE — Telephone Encounter (Signed)
New message     Please call patient has questions on his lab work from last Friday

## 2017-09-30 NOTE — Telephone Encounter (Signed)
Spoke with pt and he wanted to make sure that he was to continue taking the Spirolactone.  Advised pt labs were good and to continue.  Pt verbalized understanding and was appreciative for call.

## 2017-09-30 NOTE — Telephone Encounter (Signed)
Called to follow up with patient - patient stated he needed time to figure out how to fit this program in his schedule. Will follow up in two weeks.

## 2017-10-16 ENCOUNTER — Telehealth (HOSPITAL_COMMUNITY): Payer: Self-pay

## 2017-10-16 ENCOUNTER — Ambulatory Visit: Payer: Medicare Other | Admitting: Interventional Cardiology

## 2017-10-16 NOTE — Telephone Encounter (Signed)
Attempted to call patient in regards to cardiac rehab - lm on vm °

## 2017-10-24 ENCOUNTER — Encounter (HOSPITAL_COMMUNITY): Payer: Self-pay

## 2017-10-24 ENCOUNTER — Telehealth (HOSPITAL_COMMUNITY): Payer: Self-pay

## 2017-10-24 NOTE — Telephone Encounter (Signed)
2nd attempt to call patient in regards to Cardiac Rehab - lm on vm. Sending letter. °

## 2017-11-01 DIAGNOSIS — Z08 Encounter for follow-up examination after completed treatment for malignant neoplasm: Secondary | ICD-10-CM | POA: Diagnosis not present

## 2017-11-01 DIAGNOSIS — L570 Actinic keratosis: Secondary | ICD-10-CM | POA: Diagnosis not present

## 2017-11-01 DIAGNOSIS — Z85828 Personal history of other malignant neoplasm of skin: Secondary | ICD-10-CM | POA: Diagnosis not present

## 2017-11-01 DIAGNOSIS — L821 Other seborrheic keratosis: Secondary | ICD-10-CM | POA: Diagnosis not present

## 2017-11-01 DIAGNOSIS — D1801 Hemangioma of skin and subcutaneous tissue: Secondary | ICD-10-CM | POA: Diagnosis not present

## 2017-11-04 ENCOUNTER — Ambulatory Visit: Payer: Medicare Other | Admitting: Internal Medicine

## 2017-11-07 ENCOUNTER — Other Ambulatory Visit: Payer: Self-pay | Admitting: Cardiothoracic Surgery

## 2017-11-07 ENCOUNTER — Other Ambulatory Visit: Payer: Self-pay | Admitting: Internal Medicine

## 2017-11-18 ENCOUNTER — Other Ambulatory Visit: Payer: Self-pay

## 2017-11-18 ENCOUNTER — Emergency Department (HOSPITAL_COMMUNITY): Payer: Medicare Other

## 2017-11-18 ENCOUNTER — Emergency Department (HOSPITAL_COMMUNITY)
Admission: EM | Admit: 2017-11-18 | Discharge: 2017-11-18 | Disposition: A | Discharge status: Home / Self Care | Payer: Medicare Other | Attending: Emergency Medicine | Admitting: Emergency Medicine

## 2017-11-18 ENCOUNTER — Encounter (HOSPITAL_COMMUNITY): Payer: Self-pay

## 2017-11-18 DIAGNOSIS — Z7984 Long term (current) use of oral hypoglycemic drugs: Secondary | ICD-10-CM | POA: Diagnosis not present

## 2017-11-18 DIAGNOSIS — Z951 Presence of aortocoronary bypass graft: Secondary | ICD-10-CM | POA: Insufficient documentation

## 2017-11-18 DIAGNOSIS — I509 Heart failure, unspecified: Secondary | ICD-10-CM | POA: Insufficient documentation

## 2017-11-18 DIAGNOSIS — E119 Type 2 diabetes mellitus without complications: Secondary | ICD-10-CM | POA: Diagnosis not present

## 2017-11-18 DIAGNOSIS — Z79899 Other long term (current) drug therapy: Secondary | ICD-10-CM | POA: Diagnosis not present

## 2017-11-18 DIAGNOSIS — Z7982 Long term (current) use of aspirin: Secondary | ICD-10-CM | POA: Insufficient documentation

## 2017-11-18 DIAGNOSIS — I110 Hypertensive heart disease with heart failure: Secondary | ICD-10-CM | POA: Insufficient documentation

## 2017-11-18 DIAGNOSIS — M545 Low back pain, unspecified: Secondary | ICD-10-CM

## 2017-11-18 DIAGNOSIS — Z8546 Personal history of malignant neoplasm of prostate: Secondary | ICD-10-CM | POA: Diagnosis not present

## 2017-11-18 DIAGNOSIS — I2510 Atherosclerotic heart disease of native coronary artery without angina pectoris: Secondary | ICD-10-CM | POA: Diagnosis not present

## 2017-11-18 LAB — URINALYSIS, ROUTINE W REFLEX MICROSCOPIC
Bacteria, UA: NONE SEEN
Bilirubin Urine: NEGATIVE
Hgb urine dipstick: NEGATIVE
Ketones, ur: 5 mg/dL — AB
Leukocytes, UA: NEGATIVE
Nitrite: NEGATIVE
PROTEIN: NEGATIVE mg/dL
RBC / HPF: NONE SEEN RBC/hpf (ref 0–5)
Specific Gravity, Urine: 1.018 (ref 1.005–1.030)
WBC UA: NONE SEEN WBC/hpf (ref 0–5)
pH: 5 (ref 5.0–8.0)

## 2017-11-18 MED ORDER — CAMPHOR-MENTHOL-METHYL SAL 1.2-5.7-6.3 % EX PTCH: MEDICATED_PATCH | CUTANEOUS | 0 refills | Status: DC

## 2017-11-18 MED ORDER — HYDROCODONE-ACETAMINOPHEN 5-325 MG PO TABS: ORAL_TABLET | ORAL | 0 refills | Status: DC

## 2017-11-18 NOTE — ED Notes (Signed)
Pt states lower back pain that started yesterday , took a robaxin yesterday and an "oxy something " early this am, states saw his chiropractor this  Past week,  States mowed the grass on Saturday,

## 2017-11-18 NOTE — ED Triage Notes (Signed)
Pt reports pain in lower back X 1 week, denies any injury or trauma.

## 2017-11-18 NOTE — ED Notes (Signed)
Pt given a urinal.

## 2017-11-18 NOTE — ED Notes (Signed)
Patient in xray 

## 2017-11-18 NOTE — ED Provider Notes (Signed)
[facility] EMERGENCY DEPARTMENT Provider Note   CSN: [id number] Arrival date & time: 11/18/17  1051     History   Chief Complaint Chief Complaint  Patient presents with  . Back Pain    HPI   Blood pressure (!) 139/50, pulse 66, temperature 97.8 F (36.6 C), temperature source Oral, resp. rate 16, height 6' (1.829 m), weight 99.8 kg (220 lb), SpO2 98 %.  Larry [name] is a 77 y.o. male complaining of nonradiating low back pain onset 2 days ago after patient was doing some yard work.  He periodically gets low back pain, he sees a chiropractor for this and normally has an adjustment with no issues however pain was extremely severe this morning, is exacerbated by movement and palpation, he took some oxycodone at home with little relief.  He has a history of prostate cancer in remission, he denies any incontinence, fever chills, numbness or weakness.  Past Medical History:  Diagnosis Date  . Carotid stenosis, left   . Cataracts, bilateral    removed  . Coronary artery disease   . Hyperlipemia   . Prostate CA ([place])   . Sleep apnea    Dr Ledora Bottcher ; uses CPAP  . Type II or unspecified type diabetes mellitus without mention of complication, not stated as uncontrolled   . Unspecified essential hypertension     Patient Active Problem List   Diagnosis Date Noted  . CHF (congestive heart failure) ([place]) 08/25/2017  . Acute heart failure with reduced ejection fraction and diastolic dysfunction ([place]) 08/25/2017  . Carotid stenosis 08/22/2017  . 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 CAD in native artery 06/24/2017  . Midsternal chest pain 06/18/2017  . DOE (dyspnea on exertion) 06/18/2017  . Migraine with aura 11/21/2016  . Cramp and spasm 04/06/2016  . Sciatic radiculitis 03/23/2016  . CAP (community acquired pneumonia) 08/26/2015  . Cough 08/23/2015  . Wheezing 08/23/2015  . Bruising 09/01/2013  . Sinusitis 08/27/2012  . Cerumen impaction 08/27/2012  . Edema 04/23/2012    . Well adult exam 07/13/2011  . OBSTRUCTIVE SLEEP APNEA 10/23/2010  . LOW BACK PAIN, ACUTE 07/19/2009  . DM2 (diabetes mellitus, type 2) ([place]) 06/24/2007  . Dyslipidemia 06/24/2007  . Essential hypertension 06/24/2007  . PROSTATE CANCER, HX OF 06/24/2007    Past Surgical History:  Procedure Laterality Date  . APPENDECTOMY    . CATARACT EXTRACTION, BILATERAL    . CORONARY ARTERY BYPASS GRAFT N/A 06/27/2017   Procedure: CORONARY ARTERY BYPASS GRAFTING (CABG), time four   using the left internal mammary artery, and right saphenous leg vein harvested endoscopically.;  Surgeon: Ivin Poot, MD;  Location: [facility];  Service: Open Heart Surgery;  Laterality: N/A;  . CYSTOSCOPY N/A 06/27/2017   Procedure: CYSTOSCOPY FLEXIBLE, Balloon dilatation placement of foley catheter;  Surgeon: Ivin Poot, MD;  Location: [facility];  Service: Open Heart Surgery;  Laterality: N/A;  . ENDARTERECTOMY Left 08/22/2017   Procedure: ENDARTERECTOMY CAROTID LEFT;  Surgeon: Serafina Mitchell, MD;  Location: [facility];  Service: Vascular;  Laterality: Left;  . INTRAVASCULAR ULTRASOUND/IVUS N/A 06/24/2017   Procedure: Intravascular Ultrasound/IVUS;  Surgeon: Belva Crome, MD;  Location: [facility];  Service: Cardiovascular;  Laterality: N/A;  LEFT MAIN  . LEFT HEART CATH AND CORONARY ANGIOGRAPHY N/A 06/24/2017   Procedure: LEFT HEART CATH AND CORONARY ANGIOGRAPHY;  Surgeon: Belva Crome, MD;  Location: [facility];  Service: Cardiovascular;  Laterality: N/A;  .  PATCH ANGIOPLASTY Left 08/22/2017   Procedure: PATCH ANGIOPLASTY USING Rueben Bash BIOLOGIC PATCH;  Surgeon: Serafina Mitchell, MD;  Location: [facility];  Service: Vascular;  Laterality: Left;  . PROSTATECTOMY    . TEE WITHOUT CARDIOVERSION N/A 06/27/2017   Procedure: TRANSESOPHAGEAL ECHOCARDIOGRAM (TEE);  Surgeon: Prescott Gum, Collier Salina, MD;  Location: [facility];  Service: Open Heart Surgery;  Laterality: N/A;  . TONSILLECTOMY    . ULTRASOUND GUIDANCE FOR VASCULAR  ACCESS  06/24/2017   Procedure: Ultrasound Guidance For Vascular Access;  Surgeon: Belva Crome, MD;  Location: [facility];  Service: Cardiovascular;;        Home Medications    Prior to Admission medications   Medication Sig Start Date End Date Taking? Authorizing Provider  aspirin EC 325 MG EC tablet Take 1 tablet (325 mg total) daily by mouth. 07/03/17  Yes Barrett, Erin R, PA-C  Cholecalciferol (VITAMIN D3) 1000 UNITS CAPS Take 1,000 Units by mouth daily.    Yes [provider]  furosemide (LASIX) 40 MG tablet Take 1 tablet (40 mg total) by mouth daily. 09/10/17 12/09/17 Yes Burtis Junes, NP  glimepiride (AMARYL) 2 MG tablet Take 1 tablet (2 mg total) by mouth 2 (two) times daily. 08/29/17  Yes Plotnikov, [name], MD  losartan (COZAAR) 100 MG tablet Take 1 tablet (100 mg total) by mouth daily. 08/29/17 11/27/17 Yes Plotnikov, [name], MD  metFORMIN (GLUCOPHAGE) 1000 MG tablet TAKE 1 TABLET BY MOUTH TWICE DAILY WITH MEALS Patient taking differently: TAKE 1 TABLET (104m) BY MOUTH TWICE DAILY WITH MEALS 09/10/17  Yes Plotnikov, AEvie Lacks MD  metoprolol tartrate (LOPRESSOR) 50 MG tablet Take 1 tablet (50 mg total) by mouth 2 (two) times daily. 08/29/17  Yes Plotnikov, AEvie Lacks MD  Multiple Vitamins-Minerals (MACULAR VITAMIN BENEFIT) TABS Take 1 tablet by mouth 2 (two) times daily.   Yes [provider]  oxyCODONE-acetaminophen (PERCOCET/ROXICET) 5-325 MG tablet Take 1 tablet by mouth every 6 (six) hours as needed for moderate pain. 08/23/17  Yes EDagoberto Ligas PA-C  potassium chloride (K-DUR) 10 MEQ tablet Take 1 tablet (10 mEq total) by mouth daily. 09/19/17 12/18/17 Yes SBelva Crome MD  simvastatin (ZOCOR) 40 MG tablet TAKE 1 TABLET BY MOUTH AT BEDTIME Patient taking differently: TAKE 1 TABLET (4107m BY MOUTH AT BEDTIME 11/07/17  Yes Plotnikov, AlEvie LacksMD  spironolactone (ALDACTONE) 25 MG tablet Take 0.5 tablets (12.5 mg total) by mouth daily. 09/19/17   Yes SmBelva CromeMD  ACCU-CHEK FASTCLIX LANCETS MISC 1 each by Does not apply route 2 (two) times daily. Dx:E11.9 10/25/15   Plotnikov, AlEvie LacksMD  Camphor-Menthol-Methyl Sal (HM SALONPAS PAIN RELIEF) 1.2-5.7-6.3 % PTCH Clean and dry affected area, remove patch from backing film and apply to skin. Apply one patch to the affected area and leave in place for up to 8 to 12 hours. If pain lasts after using the first patch, a second patch may be applied for up to another 8 to 12 hours. 11/18/17   Valton Schwartz, NiElmyra RicksPA-C  glucose blood (ACCU-CHEK SMARTVIEW) test strip 1 each by Other route 2 (two) times daily. Use to check blood sugars twice a day Dx E11.9 11/21/16   Plotnikov, AlEvie LacksMD  glucose blood test strip 1 each by Other route 2 (two) times daily. Please dispense brand of choice per pt and insurance preference. Dx:250.00 05/08/13   Plotnikov, AlEvie LacksMD  glucose monitoring kit (FREESTYLE) monitoring kit 1 each by Does not apply  route as needed for other. Please dispense brand of choice per ins/patient. Dx: 250.00. 05/08/13   Plotnikov, [name], MD  HYDROcodone-acetaminophen (NORCO/VICODIN) 5-325 MG tablet Take 1-2 tablets by mouth every 6 hours as needed for pain and/or cough. 11/18/17   Chloeann Alfred, Elmyra Ricks, PA-C  nitroGLYCERIN (NITROSTAT) 0.4 MG SL tablet Place 1 tablet (0.4 mg total) under the tongue every 5 (five) minutes as needed for chest pain. 06/18/17   Plotnikov, [name], MD    Family History Family History  Problem Relation Age of Onset  . Heart disease Mother   . Diabetes Unknown   . Hypertension Unknown   . Colon cancer Neg Hx     Social History Social History   Tobacco Use  . Smoking status: Never Smoker  . Smokeless tobacco: Never Used  Substance Use Topics  . Alcohol use: No    Alcohol/week: 0.0 oz  . Drug use: No     Allergies   Amlodipine; Verapamil; Clindamycin hcl; and Contrast media [iodinated diagnostic agents]   Review of Systems Review of  Systems  A complete review of systems was obtained and all systems are negative except as noted in the HPI and PMH.   Physical Exam Updated Vital Signs BP (!) 139/50 (BP Location: Left Arm)   Pulse 66   Temp 97.8 F (36.6 C) (Oral)   Resp 16   Ht 6' (1.829 m)   Wt 99.8 kg (220 lb)   SpO2 98%   BMI 29.84 kg/m   Physical Exam  Constitutional: He appears well-developed and well-nourished.  HENT:  Head: Normocephalic.  Eyes: Conjunctivae are normal.  Neck: Normal range of motion.  Cardiovascular: Normal rate, regular rhythm and intact distal pulses.  Pulmonary/Chest: Effort normal.  Abdominal: Soft. There is no tenderness.  Neurological: He is alert.  No point tenderness to percussion of lumbar spinal processes.  No TTP or paraspinal muscular spasm. Strength is 5 out of 5 to bilateral lower extremities at hip and knee; extensor hallucis longus 5 out of 5. Ankle strength 5 out of 5, no clonus, neurovascularly intact. No saddle anaesthesia. Patellar reflexes are 2+ bilaterally.    Psychiatric: He has a normal mood and affect.  Nursing note and vitals reviewed.    ED Treatments / Results  Labs (all labs ordered are listed, but only abnormal results are displayed) Labs Reviewed  URINALYSIS, ROUTINE W REFLEX MICROSCOPIC - Abnormal; Notable for the following components:      Result Value   Color, Urine AMBER (*)    APPearance HAZY (*)    Glucose, UA >=500 (*)    Ketones, ur 5 (*)    Squamous Epithelial / LPF 0-5 (*)    All other components within normal limits    EKG None  Radiology Dg Lumbar Spine Complete  Result Date: 11/18/2017 CLINICAL DATA:  Lumbago for 2 days EXAM: LUMBAR SPINE - COMPLETE 4+ VIEW COMPARISON:  Lumbar MRI April 04, 2016. FINDINGS: Frontal, lateral, spot lumbosacral lateral, and bilateral oblique views were obtained. There are 4 strictly non-rib-bearing lumbar type vertebral bodies. There is a lumbosacral transitional vertebra. No fracture or  spondylolisthesis. There are anterior osteophytes at all levels. Disc spaces appear unremarkable. There is facet osteoarthritic change at L4-5 and L5-S1 bilaterally. There is aortoiliac atherosclerosis. IMPRESSION: No fracture or spondylolisthesis. No appreciable disc space narrowing. 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 Aortic Atherosclerosis (ICD10-I70.0). Electronically Signed   By: Lowella Grip III M.D.  On: 11/18/2017 12:03    Procedures Procedures (including critical care time)  Medications Ordered in ED Medications - No data to display   Initial Impression / Assessment and Plan / ED Course  I have reviewed the triage vital signs and the nursing notes.  Pertinent labs & imaging results that were available during my care of the patient were reviewed by me and considered in my medical decision making (see chart for details).     Vitals:   11/18/17 1113  BP: (!) 139/50  Pulse: 66  Resp: 16  Temp: 97.8 F (36.6 C)  TempSrc: Oral  SpO2: 98%  Weight: 99.8 kg (220 lb)  Height: 6' (1.829 m)    Larry [name] is 77 y.o. male presenting with  back pain.  Client pain medication in the ED.  No neurological deficits and normal neuro exam.  Patient can walk but states is painful.  X-ray negative, urinalysis is without signs of infection.  No loss of bowel or bladder control.  No concern for cauda equina.  No fever, night sweats, weight loss, h/o cancer, IVDU.  RICE protocol and pain medicine indicated and discussed with patient.  Evaluation does not show pathology that would require ongoing emergent intervention or inpatient treatment. Pt is hemodynamically stable and mentating appropriately. Discussed findings and plan with patient/guardian, who agrees with care plan. All questions answered. Return precautions discussed and outpatient follow up given.      Final Clinical Impressions(s) / ED Diagnoses   Final  diagnoses:  Acute bilateral low back pain without sciatica    ED Discharge Orders        Ordered    Camphor-Menthol-Methyl Sal (HM SALONPAS PAIN RELIEF) 1.2-5.7-6.3 % PTCH     11/18/17 1401    HYDROcodone-acetaminophen (NORCO/VICODIN) 5-325 MG tablet     11/18/17 1401       Evone Arseneau, Charna Elizabeth 11/18/17 1718    Carmin Muskrat, MD 11/19/17 [phone]

## 2017-11-18 NOTE — Discharge Instructions (Addendum)
Take vicodin for breakthrough pain, do not drink alcohol, drive, care for children or do other critical tasks while taking vicodin.  Please be very careful not to fall! The pain medication and puts you at risk for falls. Please rest as much as possible and try to not stay alone.   Please follow with your primary care doctor in the next 2 days for a check-up. They must obtain records for further management.   Do not hesitate to return to the Emergency Department for any new, worsening or concerning symptoms.

## 2017-11-21 DIAGNOSIS — M545 Low back pain: Secondary | ICD-10-CM | POA: Diagnosis not present

## 2017-11-26 ENCOUNTER — Ambulatory Visit (INDEPENDENT_AMBULATORY_CARE_PROVIDER_SITE_OTHER): Payer: Medicare Other | Admitting: Internal Medicine

## 2017-11-26 ENCOUNTER — Encounter: Payer: Self-pay | Admitting: Internal Medicine

## 2017-11-26 DIAGNOSIS — I259 Chronic ischemic heart disease, unspecified: Secondary | ICD-10-CM

## 2017-11-26 DIAGNOSIS — K5901 Slow transit constipation: Secondary | ICD-10-CM | POA: Diagnosis not present

## 2017-11-26 DIAGNOSIS — K5900 Constipation, unspecified: Secondary | ICD-10-CM | POA: Insufficient documentation

## 2017-11-26 DIAGNOSIS — E118 Type 2 diabetes mellitus with unspecified complications: Secondary | ICD-10-CM

## 2017-11-26 DIAGNOSIS — I5021 Acute systolic (congestive) heart failure: Secondary | ICD-10-CM | POA: Diagnosis not present

## 2017-11-26 DIAGNOSIS — E785 Hyperlipidemia, unspecified: Secondary | ICD-10-CM | POA: Diagnosis not present

## 2017-11-26 MED ORDER — LINACLOTIDE 290 MCG PO CAPS: 290.0000 ug | ORAL_CAPSULE | Freq: Every day | ORAL | 3 refills | Status: DC | PRN

## 2017-11-26 MED ORDER — GLUCOSE BLOOD VI STRP: 1.0000 | ORAL_STRIP | Freq: Two times a day (BID) | 11 refills | Status: DC

## 2017-11-26 MED ORDER — ACCU-CHEK FASTCLIX LANCETS MISC: 1.0000 | Freq: Two times a day (BID) | 11 refills | Status: DC

## 2017-11-26 NOTE — Patient Instructions (Signed)
Can try Miralax, Senakot-S too

## 2017-11-26 NOTE — Assessment & Plan Note (Signed)
Resolved post-op 

## 2017-11-26 NOTE — Assessment & Plan Note (Signed)
2019 ?meds related [place]

## 2017-11-26 NOTE — Assessment & Plan Note (Signed)
On Zocor 

## 2017-11-26 NOTE — Assessment & Plan Note (Signed)
Metformin, Glimepiride  

## 2017-11-26 NOTE — Progress Notes (Signed)
Subjective:  Patient ID: [name], male    DOB: 1941/08/16  Age: 77 y.o. MRN: [id number]  CC: No chief complaint on file.   HPI PRATEEK KNIPPLE presents for LBP, CAD, DM f/u C/o recent LBP flare up  Outpatient Medications Prior to Visit  Medication Sig Dispense Refill  . ACCU-CHEK FASTCLIX LANCETS MISC 1 each by Does not apply route 2 (two) times daily. Dx:E11.9 100 each 5  . aspirin EC 325 MG EC tablet Take 1 tablet (325 mg total) daily by mouth. 30 tablet 0  . Camphor-Menthol-Methyl Sal (HM SALONPAS PAIN RELIEF) 1.2-5.7-6.3 % PTCH Clean and dry affected area, remove patch from backing film and apply to skin. Apply one patch to the affected area and leave in place for up to 8 to 12 hours. If pain lasts after using the first patch, a second patch may be applied for up to another 8 to 12 hours. 40 patch 0  . Cholecalciferol (VITAMIN D3) 1000 UNITS CAPS Take 1,000 Units by mouth daily.     . furosemide (LASIX) 40 MG tablet Take 1 tablet (40 mg total) by mouth daily. 90 tablet 3  . glimepiride (AMARYL) 2 MG tablet Take 1 tablet (2 mg total) by mouth 2 (two) times daily. 180 tablet 3  . glucose blood (ACCU-CHEK SMARTVIEW) test strip 1 each by Other route 2 (two) times daily. Use to check blood sugars twice a day Dx E11.9 100 each 3  . glucose blood test strip 1 each by Other route 2 (two) times daily. Please dispense brand of choice per pt and insurance preference. Dx:250.00 100 each 5  . glucose monitoring kit (FREESTYLE) monitoring kit 1 each by Does not apply route as needed for other. Please dispense brand of choice per ins/patient. Dx: 250.00. 1 each 0  . losartan (COZAAR) 100 MG tablet Take 1 tablet (100 mg total) by mouth daily. 90 tablet 3  . metFORMIN (GLUCOPHAGE) 1000 MG tablet TAKE 1 TABLET BY MOUTH TWICE DAILY WITH MEALS (Patient taking differently: TAKE 1 TABLET (1019m) BY MOUTH TWICE DAILY WITH MEALS) 180 tablet 3  . metoprolol tartrate (LOPRESSOR) 50 MG tablet Take 1  tablet (50 mg total) by mouth 2 (two) times daily. 180 tablet 3  . Multiple Vitamins-Minerals (MACULAR VITAMIN BENEFIT) TABS Take 1 tablet by mouth 2 (two) times daily.    . nitroGLYCERIN (NITROSTAT) 0.4 MG SL tablet Place 1 tablet (0.4 mg total) under the tongue every 5 (five) minutes as needed for chest pain. 20 tablet 3  . potassium chloride (K-DUR) 10 MEQ tablet Take 1 tablet (10 mEq total) by mouth daily. 30 tablet 3  . simvastatin (ZOCOR) 40 MG tablet TAKE 1 TABLET BY MOUTH AT BEDTIME (Patient taking differently: TAKE 1 TABLET (42m BY MOUTH AT BEDTIME) 90 tablet 3  . spironolactone (ALDACTONE) 25 MG tablet Take 0.5 tablets (12.5 mg total) by mouth daily. 15 tablet 3  . HYDROcodone-acetaminophen (NORCO/VICODIN) 5-325 MG tablet Take 1-2 tablets by mouth every 6 hours as needed for pain and/or cough. 11 tablet 0  . oxyCODONE-acetaminophen (PERCOCET/ROXICET) 5-325 MG tablet Take 1 tablet by mouth every 6 (six) hours as needed for moderate pain. 8 tablet 0   No facility-administered medications prior to visit.     ROS Review of Systems  Constitutional: Negative for appetite change, fatigue and unexpected weight change.  HENT: Negative for congestion, nosebleeds, sneezing, sore throat and trouble swallowing.   Eyes: Negative for itching and visual disturbance.  Respiratory:  Negative for cough.   Cardiovascular: Negative for chest pain, palpitations and leg swelling.  Gastrointestinal: Positive for constipation. Negative for abdominal distention, blood in stool, diarrhea and nausea.  Genitourinary: Negative for frequency and hematuria.  Musculoskeletal: Positive for back pain. Negative for gait problem, joint swelling and neck pain.  Skin: Negative for rash.  Neurological: Negative for dizziness, tremors, speech difficulty and weakness.  Psychiatric/Behavioral: Negative for agitation, dysphoric mood and sleep disturbance. The patient is not nervous/anxious.     Objective:  BP 128/64 (BP  Location: Left Arm, Patient Position: Sitting, Cuff Size: Large)   Pulse 68   Temp 98.4 F (36.9 C) (Oral)   Ht 6' (1.829 m)   Wt 235 lb (106.6 kg)   SpO2 97%   BMI 31.87 kg/m   BP Readings from Last 3 Encounters:  11/26/17 128/64  11/18/17 (!) 139/50  09/18/17 125/62    Wt Readings from Last 3 Encounters:  11/26/17 235 lb (106.6 kg)  11/18/17 220 lb (99.8 kg)  09/18/17 224 lb (101.6 kg)    Physical Exam  Constitutional: He is oriented to person, place, and time. He appears well-developed. No distress.  NAD  HENT:  Mouth/Throat: Oropharynx is clear and moist.  Eyes: Pupils are equal, round, and reactive to light. Conjunctivae are normal.  Neck: Normal range of motion. No JVD present. No thyromegaly present.  Cardiovascular: Normal rate, regular rhythm, normal heart sounds and intact distal pulses. Exam reveals no gallop and no friction rub.  No murmur heard. Pulmonary/Chest: Effort normal and breath sounds normal. No respiratory distress. He has no wheezes. He has no rales. He exhibits no tenderness.  Abdominal: Soft. Bowel sounds are normal. He exhibits no distension and no mass. There is no tenderness. There is no rebound and no guarding.  Musculoskeletal: Normal range of motion. He exhibits no edema or tenderness.  Lymphadenopathy:    He has no cervical adenopathy.  Neurological: He is alert and oriented to person, place, and time. He has normal reflexes. No cranial nerve deficit. He exhibits normal muscle tone. He displays a negative Romberg sign. Coordination and gait normal.  Skin: Skin is warm and dry. No rash noted.  Psychiatric: He has a normal mood and affect. His behavior is normal. Judgment and thought content normal.  obese  Lab Results  Component Value Date   WBC 10.1 08/27/2017   HGB 11.6 (L) 08/27/2017   HCT 36.6 (L) 08/27/2017   PLT 291 08/27/2017   GLUCOSE 211 (H) 09/27/2017   CHOL 128 08/06/2017   TRIG 117 08/06/2017   HDL 39 (L) 08/06/2017    LDLCALC 66 08/06/2017   ALT 13 (L) 08/26/2017   AST 15 08/26/2017   NA 141 09/27/2017   K 4.9 09/27/2017   CL 102 09/27/2017   CREATININE 1.25 09/27/2017   BUN 28 (H) 09/27/2017   CO2 24 09/27/2017   TSH 0.879 06/25/2017   PSA 0.00 Repeated and verified X2. (L) 12/26/2012   INR 1.01 08/19/2017   HGBA1C 6.2 (H) 08/25/2017    Dg Lumbar Spine Complete  Result Date: 11/18/2017 CLINICAL DATA:  Lumbago for 2 days EXAM: LUMBAR SPINE - COMPLETE 4+ VIEW COMPARISON:  Lumbar MRI April 04, 2016. FINDINGS: Frontal, lateral, spot lumbosacral lateral, and bilateral oblique views were obtained. There are 4 strictly non-rib-bearing lumbar type vertebral bodies. There is a lumbosacral transitional vertebra. No fracture or spondylolisthesis. There are anterior osteophytes at all levels. Disc spaces appear unremarkable. There is facet osteoarthritic change at L4-5  and L5-S1 bilaterally. There is aortoiliac atherosclerosis. IMPRESSION: No fracture or spondylolisthesis. No appreciable disc space narrowing. 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 Aortic Atherosclerosis (ICD10-I70.0). Electronically Signed   By: Lowella Grip III M.D.   On: 11/18/2017 12:03    Assessment & Plan:   Diagnoses and all orders for this visit:  Slow transit constipation  Dyslipidemia  Type 2 diabetes mellitus with complication, without long-term current use of insulin (HCC)  Acute systolic congestive heart failure (HCC)  Other orders -     linaclotide (LINZESS) 290 MCG CAPS capsule; Take 1 capsule (290 mcg total) by mouth daily as needed.   I have discontinued Marily Lente. Mecum's oxyCODONE-acetaminophen and HYDROcodone-acetaminophen. I am also having him start on linaclotide. Additionally, I am having him maintain his Vitamin D3, glucose blood, glucose monitoring kit, ACCU-CHEK FASTCLIX LANCETS, glucose blood, nitroGLYCERIN, aspirin, glimepiride, losartan,  metoprolol tartrate, metFORMIN, furosemide, potassium chloride, spironolactone, simvastatin, MACULAR VITAMIN BENEFIT, and Camphor-Menthol-Methyl Sal.  Meds ordered this encounter  Medications  . linaclotide (LINZESS) 290 MCG CAPS capsule    Sig: Take 1 capsule (290 mcg total) by mouth daily as needed.    Dispense:  90 capsule    Refill:  3     Follow-up: No follow-ups on file.  Walker Kehr, MD

## 2017-12-05 ENCOUNTER — Telehealth (HOSPITAL_COMMUNITY): Payer: Self-pay | Admitting: Pharmacist

## 2017-12-09 ENCOUNTER — Encounter (HOSPITAL_COMMUNITY): Payer: Self-pay

## 2017-12-09 NOTE — Telephone Encounter (Signed)
Cardiac Rehab - Pharmacy Resident Documentation   Patient unable to be reached after three call attempts. Please complete allergy verification and medication review during patient's cardiac rehab appointment.    Mila Merry Gerarda Fraction, PharmD PGY1 Pharmacy Resident Pager: [phone] 12/09/17   2:27 PM

## 2017-12-10 ENCOUNTER — Encounter (HOSPITAL_COMMUNITY): Payer: Self-pay

## 2017-12-10 ENCOUNTER — Encounter (HOSPITAL_COMMUNITY)
Admission: RE | Admit: 2017-12-10 | Discharge: 2017-12-10 | Disposition: A | Discharge status: Home / Self Care | Payer: Medicare Other | Source: Ambulatory Visit | Attending: Interventional Cardiology | Admitting: Interventional Cardiology

## 2017-12-10 VITALS — BP 130/70 | HR 65 | Ht 72.0 in | Wt 234.3 lb

## 2017-12-10 DIAGNOSIS — Z8546 Personal history of malignant neoplasm of prostate: Secondary | ICD-10-CM | POA: Insufficient documentation

## 2017-12-10 DIAGNOSIS — Z7984 Long term (current) use of oral hypoglycemic drugs: Secondary | ICD-10-CM | POA: Diagnosis not present

## 2017-12-10 DIAGNOSIS — Z951 Presence of aortocoronary bypass graft: Secondary | ICD-10-CM | POA: Diagnosis not present

## 2017-12-10 DIAGNOSIS — I2510 Atherosclerotic heart disease of native coronary artery without angina pectoris: Secondary | ICD-10-CM | POA: Diagnosis not present

## 2017-12-10 DIAGNOSIS — Z7982 Long term (current) use of aspirin: Secondary | ICD-10-CM | POA: Insufficient documentation

## 2017-12-10 DIAGNOSIS — Z79899 Other long term (current) drug therapy: Secondary | ICD-10-CM | POA: Diagnosis not present

## 2017-12-10 NOTE — Progress Notes (Signed)
Cardiac Rehab Medication Review by a Nurse  Does the patient  feel that his/her medications are working for him/her?  yes  Has the patient been experiencing any side effects to the medications prescribed?  no  Does the patient measure his/her own blood pressure or blood glucose at home?  yes   Does the patient have any problems obtaining medications due to transportation or finances?   no  Understanding of regimen: excellent Understanding of indications: excellent Potential of compliance: excellent    Nurse comments: Patient compliant and has a good understanding of his medications.    Harrell Gave RN BSN 12/10/2017 4:08 PM

## 2017-12-10 NOTE — Progress Notes (Signed)
Cardiac Individual Treatment Plan  Patient Details  Name: Larry [name] MRN: [id number] Date of Birth: 01-17-1941 Referring Provider:     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  ACCU-CHEK FASTCLIX LANCETS MISC, 1 each by Does not apply route 2 (two) times daily. Dx:E11.9, Disp: 100 each, Rfl: 11 .  aspirin EC 325 MG EC tablet, Take 1 tablet (325 mg total) daily by mouth., Disp: 30 tablet, Rfl: 0 .  Camphor-Menthol-Methyl Sal (HM SALONPAS PAIN RELIEF) 1.2-5.7-6.3 % PTCH, Clean and dry affected area, remove patch from backing film and apply to skin. Apply one patch to the affected area and leave in place for up to 8 to 12 hours. If pain lasts after using the first patch, a second patch may be applied for up to another 8 to 12 hours., Disp: 40 patch, Rfl: 0 .  Cholecalciferol (VITAMIN D3) 1000 UNITS CAPS, Take 1,000 Units by mouth daily. , Disp: , Rfl:  .  furosemide (LASIX) 40 MG tablet, Take 1 tablet (40 mg total) by mouth daily., Disp: 90 tablet, Rfl: 3 .  glimepiride (AMARYL) 2 MG tablet, Take 1 tablet (2 mg total) by mouth 2 (two) times daily., Disp: 180 tablet, Rfl: 3 .  glucose blood (ACCU-CHEK SMARTVIEW) test strip, 1 each by Other route 2 (two) times daily. Use to check blood sugars twice a day Dx E11.9, Disp: 100 each, Rfl: 3 .  glucose blood test strip, 1 each by Other route 2 (two) times daily. Please dispense brand of choice per pt and insurance preference. Dx:250.00, Disp: 100 each, Rfl: 11 .  glucose monitoring kit (FREESTYLE) monitoring kit, 1 each by  Does not apply route as needed for other. Please dispense brand of choice per ins/patient. Dx: 250.00., Disp: 1 each, Rfl: 0 .  linaclotide (LINZESS) 290 MCG CAPS capsule, Take 1 capsule (290 mcg total) by mouth daily as needed., Disp: 90 capsule, Rfl: 3 .  losartan (COZAAR) 100 MG tablet, Take 1 tablet (100 mg total) by mouth daily., Disp: 90 tablet, Rfl: 3 .  metFORMIN (GLUCOPHAGE) 1000 MG tablet, TAKE 1 TABLET BY MOUTH TWICE DAILY WITH MEALS (Patient taking differently: TAKE 1 TABLET (1086m) BY MOUTH TWICE DAILY WITH MEALS), Disp: 180 tablet, Rfl: 3 .  metoprolol tartrate (LOPRESSOR) 50 MG tablet, Take 1 tablet (50 mg total) by mouth 2 (two) times daily., Disp: 180 tablet, Rfl: 3 .  Multiple Vitamins-Minerals (MACULAR VITAMIN BENEFIT) TABS, Take 1 tablet by mouth 2 (two) times daily., Disp: , Rfl:  .  nitroGLYCERIN (NITROSTAT) 0.4 MG SL tablet, Place 1 tablet (0.4 mg total) under the tongue every 5 (five) minutes as needed for chest pain., Disp: 20 tablet, Rfl: 3 .  potassium chloride (K-DUR) 10 MEQ tablet, Take 1 tablet (10 mEq total) by mouth daily., Disp: 30 tablet, Rfl: 3 .  simvastatin (ZOCOR) 40 MG tablet, TAKE 1 TABLET BY MOUTH AT BEDTIME (Patient taking differently: TAKE 1 TABLET (448m BY MOUTH AT BEDTIME), Disp: 90 tablet, Rfl: 3 .  spironolactone (ALDACTONE) 25 MG tablet, Take 0.5 tablets (  12.5 mg total) by mouth daily., Disp: 15 tablet, Rfl: 3  Past Medical History: Past Medical History:  Diagnosis Date  . Carotid stenosis, left   . Cataracts, bilateral    removed  . Coronary artery disease   . Hyperlipemia   . Prostate CA ([place])   . Sleep apnea    Dr Ledora Bottcher ; uses CPAP  . Type II or unspecified type diabetes mellitus without mention of complication, not stated as uncontrolled   . Unspecified essential hypertension     Tobacco Use: Social History   Tobacco Use  Smoking Status Never Smoker  Smokeless Tobacco Never Used    Labs: Recent Review Flowsheet Data    Labs  for ITP Cardiac and Pulmonary Rehab Latest Ref Rng & Units 06/28/2017 06/28/2017 06/30/2017 08/06/2017 08/25/2017   Cholestrol 100 - 199 mg/dL - - - 128 -   LDLCALC 0 - 99 mg/dL - - - 66 -   HDL >39 mg/dL - - - 39(L) -   Trlycerides 0 - 149 mg/dL - - - 117 -   Hemoglobin A1c 4.8 - 5.6 % - - - - 6.2(H)   PHART 7.350 - 7.450 7.396 - - - -   PCO2ART 32.0 - 48.0 mmHg 39.9 - - - -   HCO3 20.0 - 28.0 mmol/L 24.3 - - - -   TCO2 22 - 32 mmol/L 26 24 - - -   ACIDBASEDEF 0.0 - 2.0 mmol/L - - - - -   O2SAT % 94.0 - 57.2 - -      Capillary Blood Glucose: Lab Results  Component Value Date   GLUCAP 190 (H) 08/27/2017   GLUCAP 198 (H) 08/26/2017   GLUCAP 105 (H) 08/26/2017   GLUCAP 162 (H) 08/26/2017   GLUCAP 189 (H) 08/26/2017     Exercise Target Goals: Date: 12/10/17  Exercise Program Goal: Individual exercise prescription set using results from initial 6 min walk test and THRR while considering  patient's activity barriers and safety.   Exercise Prescription Goal: Initial exercise prescription builds to 30-45 minutes a day of aerobic activity, 2-3 days per week.  Home exercise guidelines will be given to patient during program as part of exercise prescription that the participant will acknowledge.  Activity Barriers & Risk Stratification: Activity Barriers & Cardiac Risk Stratification - 12/10/17 1120      Activity Barriers & Cardiac Risk Stratification   Activity Barriers  None    Cardiac Risk Stratification  High       6 Minute Walk: 6 Minute Walk    Row Name 12/10/17 1116         6 Minute Walk   Phase  Initial     Distance  1482 feet     Walk Time  6 minutes     # of Rest Breaks  0     MPH  2.81     METS  2.74     RPE  12     VO2 Peak  9.6     Symptoms  No     Resting HR  65 bpm     Resting BP  130/70     Resting Oxygen Saturation   99 %     Exercise Oxygen Saturation  during 6 min walk  95 %     Max Ex. HR  89 bpm     Max Ex. BP  156/68     2 Minute Post BP   122/60  Oxygen Initial Assessment:   Oxygen Re-Evaluation:   Oxygen Discharge (Final Oxygen Re-Evaluation):   Initial Exercise Prescription: Initial Exercise Prescription - 12/10/17 1100      Date of Initial Exercise RX and Referring Provider   Date  12/10/17    Referring Provider  Daneen Schick, MD      Treadmill   MPH  2.1    Grade  0.5    Minutes  10    METs  2.75      Bike   Level  0.7    Minutes  10    METs  2.26      NuStep   Level  3    SPM  85    Minutes  10    METs  2.5      Prescription Details   Frequency (times per week)  3    Duration  Progress to 30 minutes of continuous aerobic without signs/symptoms of physical distress      Intensity   THRR 40-80% of Max Heartrate  58-115    Ratings of Perceived Exertion  11-13    Perceived Dyspnea  0-4      Progression   Progression  Continue to progress workloads to maintain intensity without signs/symptoms of physical distress.      Resistance Training   Training Prescription  Yes    Weight  3lbs    Reps  10-15       Perform Capillary Blood Glucose checks as needed.  Exercise Prescription Changes:   Exercise Comments:   Exercise Goals and Review: Exercise Goals    Row Name 12/10/17 1123             Exercise Goals   Increase Physical Activity  Yes       Intervention  Provide advice, education, support and counseling about physical activity/exercise needs.;Develop an individualized exercise prescription for aerobic and resistive training based on initial evaluation findings, risk stratification, comorbidities and participant's personal goals.       Expected Outcomes  Long Term: Exercising regularly at least 3-5 days a week.;Short Term: Attend rehab on a regular basis to increase amount of physical activity.;Long Term: Add in home exercise to make exercise part of routine and to increase amount of physical activity.       Increase Strength and Stamina  Yes       Intervention  Provide  advice, education, support and counseling about physical activity/exercise needs.;Develop an individualized exercise prescription for aerobic and resistive training based on initial evaluation findings, risk stratification, comorbidities and participant's personal goals.       Expected Outcomes  Short Term: Increase workloads from initial exercise prescription for resistance, speed, and METs.;Long Term: Improve cardiorespiratory fitness, muscular endurance and strength as measured by increased METs and functional capacity (6MWT)       Able to understand and use rate of perceived exertion (RPE) scale  Yes       Intervention  Provide education and explanation on how to use RPE scale       Expected Outcomes  Short Term: Able to use RPE daily in rehab to express subjective intensity level;Long Term:  Able to use RPE to guide intensity level when exercising independently       Knowledge and understanding of Target Heart Rate Range (THRR)  Yes       Intervention  Provide education and explanation of THRR including how the numbers were predicted and where they are located for reference  Expected Outcomes  Short Term: Able to state/look up THRR;Long Term: Able to use THRR to govern intensity when exercising independently;Short Term: Able to use daily as guideline for intensity in rehab       Able to check pulse independently  Yes       Intervention  Review the importance of being able to check your own pulse for safety during independent exercise;Provide education and demonstration on how to check pulse in carotid and radial arteries.       Expected Outcomes  Short Term: Able to explain why pulse checking is important during independent exercise;Long Term: Able to check pulse independently and accurately       Understanding of Exercise Prescription  Yes       Intervention  Provide education, explanation, and written materials on patient's individual exercise prescription       Expected Outcomes  Short  Term: Able to explain program exercise prescription;Long Term: Able to explain home exercise prescription to exercise independently          Exercise Goals Re-Evaluation :    Discharge Exercise Prescription (Final Exercise Prescription Changes):   Nutrition:  Target Goals: Understanding of nutrition guidelines, daily intake of sodium <1581m, cholesterol <2099m calories 30% from fat and 7% or less from saturated fats, daily to have 5 or more servings of fruits and vegetables.  Biometrics: Pre Biometrics - 12/10/17 1124      Pre Biometrics   Height  6' (1.829 m)    Weight  234 lb 5.6 oz (106.3 kg)    Waist Circumference  43.5 inches    Hip Circumference  44.5 inches    Waist to Hip Ratio  0.98 %    BMI (Calculated)  31.78    Triceps Skinfold  28 mm    % Body Fat  33.1 %    Grip Strength  39.5 kg    Flexibility  11 in    Single Leg Stand  6.76 seconds        Nutrition Therapy Plan and Nutrition Goals:   Nutrition Assessments:   Nutrition Goals Re-Evaluation:   Nutrition Goals Re-Evaluation:   Nutrition Goals Discharge (Final Nutrition Goals Re-Evaluation):   Psychosocial: Target Goals: Acknowledge presence or absence of significant depression and/or stress, maximize coping skills, provide positive support system. Participant is able to verbalize types and ability to use techniques and skills needed for reducing stress and depression.  Initial Review & Psychosocial Screening: Initial Psych Review & Screening - 12/10/17 0844      Initial Review   Current issues with  None Identified      Family Dynamics   Good Support System?  Yes spouse, family      Barriers   Psychosocial barriers to participate in program  There are no identifiable barriers or psychosocial needs.      Screening Interventions   Interventions  Encouraged to exercise       Quality of Life Scores: Quality of Life - 12/10/17 1130      Quality of Life Scores   Health/Function Pre   26.47 %    Socioeconomic Pre  24.86 %    Psych/Spiritual Pre  28.57 %    Family Pre  26.4 %    GLOBAL Pre  26.56 %      Scores of 19 and below usually indicate a poorer quality of life in these areas.  A difference of  2-3 points is a clinically meaningful difference.  A difference of 2-3 points  in the total score of the Quality of Life Index has been associated with significant improvement in overall quality of life, self-image, physical symptoms, and general health in studies assessing change in quality of life.  PHQ-9: Recent Review Flowsheet Data    Depression screen [facility] 2/9 08/29/2017 01/09/2016   Decreased Interest 0 1   Down, Depressed, Hopeless 0 0   PHQ - 2 Score 0 1     Interpretation of Total Score  Total Score Depression Severity:  1-4 = Minimal depression, 5-9 = Mild depression, 10-14 = Moderate depression, 15-19 = Moderately severe depression, 20-27 = Severe depression   Psychosocial Evaluation and Intervention:   Psychosocial Re-Evaluation:   Psychosocial Discharge (Final Psychosocial Re-Evaluation):   Vocational Rehabilitation: Provide vocational rehab assistance to qualifying candidates.   Vocational Rehab Evaluation & Intervention: Vocational Rehab - 12/10/17 0844      Initial Vocational Rehab Evaluation & Intervention   Assessment shows need for Vocational Rehabilitation  No       Education: Education Goals: Education classes will be provided on a weekly basis, covering required topics. Participant will state understanding/return demonstration of topics presented.  Learning Barriers/Preferences: Learning Barriers/Preferences - 12/09/17 1518      Learning Barriers/Preferences   Learning Barriers  None    Learning Preferences  Skilled Demonstration       Education Topics: Count Your Pulse:  -Group instruction provided by verbal instruction, demonstration, patient participation and written materials to support subject.  Instructors address  importance of being able to find your pulse and how to count your pulse when at home without a heart monitor.  Patients get hands on experience counting their pulse with staff help and individually.   Heart Attack, Angina, and Risk Factor Modification:  -Group instruction provided by verbal instruction, video, and written materials to support subject.  Instructors address signs and symptoms of angina and heart attacks.    Also discuss risk factors for heart disease and how to make changes to improve heart health risk factors.   Functional Fitness:  -Group instruction provided by verbal instruction, demonstration, patient participation, and written materials to support subject.  Instructors address safety measures for doing things around the house.  Discuss how to get up and down off the floor, how to pick things up properly, how to safely get out of a chair without assistance, and balance training.   Meditation and Mindfulness:  -Group instruction provided by verbal instruction, patient participation, and written materials to support subject.  Instructor addresses importance of mindfulness and meditation practice to help reduce stress and improve awareness.  Instructor also leads participants through a meditation exercise.    Stretching for Flexibility and Mobility:  -Group instruction provided by verbal instruction, patient participation, and written materials to support subject.  Instructors lead participants through series of stretches that are designed to increase flexibility thus improving mobility.  These stretches are additional exercise for major muscle groups that are typically performed during regular warm up and cool down.   Hands Only CPR:  -Group verbal, video, and participation provides a basic overview of AHA guidelines for community CPR. Role-play of emergencies allow participants the opportunity to practice calling for help and chest compression technique with discussion of AED  use.   Hypertension: -Group verbal and written instruction that provides a basic overview of hypertension including the most recent diagnostic guidelines, risk factor reduction with self-care instructions and medication management.    Nutrition I class: Heart Healthy Eating:  -Group instruction provided by  PowerPoint slides, verbal discussion, and written materials to support subject matter. The instructor gives an explanation and review of the Therapeutic Lifestyle Changes diet recommendations, which includes a discussion on lipid goals, dietary fat, sodium, fiber, plant stanol/sterol esters, sugar, and the components of a well-balanced, healthy diet.   Nutrition II class: Lifestyle Skills:  -Group instruction provided by PowerPoint slides, verbal discussion, and written materials to support subject matter. The instructor gives an explanation and review of label reading, grocery shopping for heart health, heart healthy recipe modifications, and ways to make healthier choices when eating out.   Diabetes Question & Answer:  -Group instruction provided by PowerPoint slides, verbal discussion, and written materials to support subject matter. The instructor gives an explanation and review of diabetes co-morbidities, pre- and post-prandial blood glucose goals, pre-exercise blood glucose goals, signs, symptoms, and treatment of hypoglycemia and hyperglycemia, and foot care basics.   Diabetes Blitz:  -Group instruction provided by PowerPoint slides, verbal discussion, and written materials to support subject matter. The instructor gives an explanation and review of the physiology behind type 1 and type 2 diabetes, diabetes medications and rational behind using different medications, pre- and post-prandial blood glucose recommendations and Hemoglobin A1c goals, diabetes diet, and exercise including blood glucose guidelines for exercising safely.    Portion Distortion:  -Group instruction provided by  PowerPoint slides, verbal discussion, written materials, and food models to support subject matter. The instructor gives an explanation of serving size versus portion size, changes in portions sizes over the last 20 years, and what consists of a serving from each food group.   Stress Management:  -Group instruction provided by verbal instruction, video, and written materials to support subject matter.  Instructors review role of stress in heart disease and how to cope with stress positively.     Exercising on Your Own:  -Group instruction provided by verbal instruction, power point, and written materials to support subject.  Instructors discuss benefits of exercise, components of exercise, frequency and intensity of exercise, and end points for exercise.  Also discuss use of nitroglycerin and activating EMS.  Review options of places to exercise outside of rehab.  Review guidelines for sex with heart disease.   Cardiac Drugs I:  -Group instruction provided by verbal instruction and written materials to support subject.  Instructor reviews cardiac drug classes: antiplatelets, anticoagulants, beta blockers, and statins.  Instructor discusses reasons, side effects, and lifestyle considerations for each drug class.   Cardiac Drugs II:  -Group instruction provided by verbal instruction and written materials to support subject.  Instructor reviews cardiac drug classes: angiotensin converting enzyme inhibitors (ACE-I), angiotensin II receptor blockers (ARBs), nitrates, and calcium channel blockers.  Instructor discusses reasons, side effects, and lifestyle considerations for each drug class.   Anatomy and Physiology of the Circulatory System:  Group verbal and written instruction and models provide basic cardiac anatomy and physiology, with the coronary electrical and arterial systems. Review of: AMI, Angina, Valve disease, Heart Failure, Peripheral Artery Disease, Cardiac Arrhythmia, Pacemakers, and  the ICD.   Other Education:  -Group or individual verbal, written, or video instructions that support the educational goals of the cardiac rehab program.   Holiday Eating Survival Tips:  -Group instruction provided by PowerPoint slides, verbal discussion, and written materials to support subject matter. The instructor gives patients tips, tricks, and techniques to help them not only survive but enjoy the holidays despite the onslaught of food that accompanies the holidays.   Knowledge Questionnaire Score: Knowledge Questionnaire Score -  12/10/17 0844      Knowledge Questionnaire Score   Pre Score  21/24       Core Components/Risk Factors/Patient Goals at Admission: Personal Goals and Risk Factors at Admission - 12/10/17 1114      Core Components/Risk Factors/Patient Goals on Admission    Weight Management  Yes;Obesity    Intervention  Weight Management: Develop a combined nutrition and exercise program designed to reach desired caloric intake, while maintaining appropriate intake of nutrient and fiber, sodium and fats, and appropriate energy expenditure required for the weight goal.;Weight Management: Provide education and appropriate resources to help participant work on and attain dietary goals.;Weight Management/Obesity: Establish reasonable short term and long term weight goals.;Obesity: Provide education and appropriate resources to help participant work on and attain dietary goals.    Admit Weight  234 lb 5.6 oz (106.3 kg)    Goal Weight: Short Term  228 lb (103.4 kg)    Goal Weight: Long Term  200 lb (90.7 kg)    Expected Outcomes  Short Term: Continue to assess and modify interventions until short term weight is achieved;Long Term: Adherence to nutrition and physical activity/exercise program aimed toward attainment of established weight goal;Weight Loss: Understanding of general recommendations for a balanced deficit meal plan, which promotes 1-2 lb weight loss per week and  includes a negative energy balance of [phone] kcal/d;Understanding recommendations for meals to include 15-35% energy as protein, 25-35% energy from fat, 35-60% energy from carbohydrates, less than 254m of dietary cholesterol, 20-35 gm of total fiber daily;Understanding of distribution of calorie intake throughout the day with the consumption of 4-5 meals/snacks    Diabetes  Yes    Intervention  Provide education about signs/symptoms and action to take for hypo/hyperglycemia.;Provide education about proper nutrition, including hydration, and aerobic/resistive exercise prescription along with prescribed medications to achieve blood glucose in normal ranges: Fasting glucose 65-99 mg/dL    Expected Outcomes  Short Term: Participant verbalizes understanding of the signs/symptoms and immediate care of hyper/hypoglycemia, proper foot care and importance of medication, aerobic/resistive exercise and nutrition plan for blood glucose control.;Long Term: Attainment of HbA1C < 7%.    Hypertension  Yes    Intervention  Provide education on lifestyle modifcations including regular physical activity/exercise, weight management, moderate sodium restriction and increased consumption of fresh fruit, vegetables, and low fat dairy, alcohol moderation, and smoking cessation.;Monitor prescription use compliance.    Expected Outcomes  Short Term: Continued assessment and intervention until BP is < 140/977mHG in hypertensive participants. < 130/8058mG in hypertensive participants with diabetes, heart failure or chronic kidney disease.;Long Term: Maintenance of blood pressure at goal levels.    Lipids  Yes    Intervention  Provide education and support for participant on nutrition & aerobic/resistive exercise along with prescribed medications to achieve LDL <66m63mDL >40mg92m Expected Outcomes  Short Term: Participant states understanding of desired cholesterol values and is compliant with medications prescribed. Participant  is following exercise prescription and nutrition guidelines.;Long Term: Cholesterol controlled with medications as prescribed, with individualized exercise RX and with personalized nutrition plan. Value goals: LDL < 66mg,55m > 40 mg.       Core Components/Risk Factors/Patient Goals Review:    Core Components/Risk Factors/Patient Goals at Discharge (Final Review):    ITP Comments: ITP Comments    Row Name 12/10/17 0843           ITP Comments  Dr. Traci Fransico [place] Director  Comments: [name] attended orientation from [place] to [phone] to review rules and guidelines for program. Completed 6 minute walk test, Intitial ITP, and exercise prescription.  VSS. 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  Asymptomatic.[name], RN,BSN 12/10/2017 12:03 PM

## 2017-12-16 ENCOUNTER — Encounter (HOSPITAL_COMMUNITY)
Admission: RE | Admit: 2017-12-16 | Discharge: 2017-12-16 | Disposition: A | Discharge status: Home / Self Care | Payer: Medicare Other | Source: Ambulatory Visit | Attending: Interventional Cardiology | Admitting: Interventional Cardiology

## 2017-12-16 ENCOUNTER — Telehealth (HOSPITAL_COMMUNITY): Payer: Self-pay | Admitting: *Deleted

## 2017-12-16 DIAGNOSIS — Z951 Presence of aortocoronary bypass graft: Secondary | ICD-10-CM

## 2017-12-16 DIAGNOSIS — I2510 Atherosclerotic heart disease of native coronary artery without angina pectoris: Secondary | ICD-10-CM | POA: Diagnosis not present

## 2017-12-16 DIAGNOSIS — Z7982 Long term (current) use of aspirin: Secondary | ICD-10-CM | POA: Diagnosis not present

## 2017-12-16 DIAGNOSIS — Z8546 Personal history of malignant neoplasm of prostate: Secondary | ICD-10-CM | POA: Diagnosis not present

## 2017-12-16 DIAGNOSIS — Z7984 Long term (current) use of oral hypoglycemic drugs: Secondary | ICD-10-CM | POA: Diagnosis not present

## 2017-12-16 DIAGNOSIS — Z79899 Other long term (current) drug therapy: Secondary | ICD-10-CM | POA: Diagnosis not present

## 2017-12-16 LAB — GLUCOSE, CAPILLARY
Glucose-Capillary: 150 mg/dL — ABNORMAL HIGH (ref 65–99)
Glucose-Capillary: 103 mg/dL — ABNORMAL HIGH (ref 65–99)

## 2017-12-16 NOTE — Progress Notes (Signed)
Daily Session Note  Patient Details  Name: Larry [name] MRN: [id number] Date of Birth: 12/31/1940 Referring Provider:     CARDIAC REHAB PHASE II ORIENTATION from 12/10/2017 in [place]  Referring Provider  Daneen Schick, MD      Encounter Date: 12/16/2017  Check In: Session Check In - 12/16/17 1531      Check-In   Location  [facility]    Staff Present  Dorma Russell, MS,ACSM CEP, Exercise Physiologist;Olinty Celesta Aver, MS, ACSM CEP, Exercise Physiologist;Joann Rion, RN, Marga Melnick, RN, BSN    Supervising physician immediately available to respond to emergencies  [facility] immediately available    Physician(s)  Dr. Tyrell Antonio    Medication changes reported      No    Fall or balance concerns reported     No    Tobacco Cessation  No Change    Warm-up and Cool-down  Performed as group-led instruction    Resistance Training Performed  Yes    VAD Patient?  No      Pain Assessment   Currently in Pain?  No/denies       Capillary Blood Glucose: Results for orders placed or performed during the hospital encounter of 12/16/17 (from the past 24 hour(s))  Glucose, capillary     Status: Abnormal   Collection Time: 12/16/17  2:43 PM  Result Value Ref Range   Glucose-Capillary 150 (H) 65 - 99 mg/dL  Glucose, capillary     Status: Abnormal   Collection Time: 12/16/17  3:39 PM  Result Value Ref Range   Glucose-Capillary 103 (H) 65 - 99 mg/dL      Social History   Tobacco Use  Smoking Status Never Smoker  Smokeless Tobacco Never Used    Goals Met:  Exercise tolerated well  Goals Unmet:  Not Applicable  Comments: Larry [name] started cardiac rehab today.  Pt tolerated light exercise without difficulty. VSS, telemetry-Sinus Rhythm BBB, asymptomatic.  Medication list reconciled. Pt denies barriers to medicaiton compliance.  PSYCHOSOCIAL ASSESSMENT:  PHQ-0. Pt exhibits positive coping skills, hopeful outlook with supportive  family. No psychosocial needs identified at this time, no psychosocial interventions necessary.    Pt enjoyed  Playing golf prior to his OHS.   Pt oriented to exercise equipment and routine.    Understanding verbalized.[name], RN,BSN 12/16/2017 4:43 PM  Dr. Fransico Him is Medical Director for Cardiac Rehab at [facility].

## 2017-12-16 NOTE — Progress Notes (Signed)
Larry [name] 77 y.o. male DOB 1941-05-24 MRN [id number]       Nutrition  1. 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 Carotid stenosis, left   . Cataracts, bilateral    removed  . Coronary artery disease   . Hyperlipemia   . Prostate CA ([place])   . Sleep apnea    Dr Ledora Bottcher ; uses CPAP  . Type II or unspecified type diabetes mellitus without mention of complication, not stated as uncontrolled   . Unspecified essential hypertension    Meds reviewed. Glimepiride, Metformin noted  HT: Ht Readings from Last 1 Encounters:  12/10/17 6' (1.829 m)    WT: Wt Readings from Last 5 Encounters:  12/10/17 234 lb 5.6 oz (106.3 kg)  11/26/17 225 lb (102.1 kg)  11/18/17 220 lb (99.8 kg)  09/18/17 224 lb (101.6 kg)  09/10/17 224 lb 12.8 oz (102 kg)     Body mass index is 31.78 kg/m.   Current tobacco use? No      Labs:  Lipid Panel     Component Value Date/Time   CHOL 128 08/06/2017 1518   TRIG 117 08/06/2017 1518   HDL 39 (L) 08/06/2017 1518   CHOLHDL 3.3 08/06/2017 1518   CHOLHDL 3 06/18/2017 0847   VLDL 21.0 06/18/2017 0847   LDLCALC 66 08/06/2017 1518    Lab Results  Component Value Date   HGBA1C 6.2 (H) 08/25/2017   CBG (last 3)  No results for input(s): GLUCAP in the last 72 hours.  Nutrition Diagnosis ? Food-and nutrition-related knowledge deficit related to lack of exposure to information as related to diagnosis of: ? CVD ? DM  ? Obesity related to excessive energy intake as evidenced by a Body mass index is 31.78 kg/m.  Nutrition Goal(s):  ? Pt to identify and limit food sources of saturated fat, trans fat, and sodium ? Pt to identify food quantities necessary to achieve weight loss of 6-24 lb (2.7-10.9 kg) at graduation from cardiac rehab. Goal wt of 200 lb desired.   Plan:  Pt to attend nutrition classes ? Nutrition I ? Nutrition II ? Portion Distortion ? Diabetes Blitz ? Diabetes Q & A Will provide  client-centered nutrition education as part of interdisciplinary care.   Monitor and evaluate progress toward nutrition goal with team.  Derek Mound, M.Ed, RD, LDN, CDE 12/16/2017 9:27 AM

## 2017-12-16 NOTE — Telephone Encounter (Signed)
-----   Message from Serafina Mitchell, MD sent at 12/15/2017  8:48 PM EDT ----- Regarding: RE: Restrictions for activity at Cardiac Rehab? No restrictions. ----- Message ----- From: Rowe Pavy, RN Sent: 12/08/2017   6:22 PM To: Serafina Mitchell, MD Subject: Restrictions for activity at Cardiac Rehab?    Dr. Trula Slade,  The above pt is eligible to participate in Cardiac rehab s/p CABG 06/2017.  Pt had a Left Carotid on 08/22/17 by you and  He completed his follow up on 09/04/17.   At this point in his recovery, any restrictions for activity and hand weights limitation?  Thanks for your advisement Maurice Small RN, BSN Cardiac and Pulmonary Rehab Nurse Navigator

## 2017-12-18 ENCOUNTER — Encounter (HOSPITAL_COMMUNITY)
Admission: RE | Admit: 2017-12-18 | Discharge: 2017-12-18 | Disposition: A | Discharge status: Home / Self Care | Payer: Medicare Other | Source: Ambulatory Visit | Attending: Interventional Cardiology | Admitting: Interventional Cardiology

## 2017-12-18 DIAGNOSIS — Z951 Presence of aortocoronary bypass graft: Secondary | ICD-10-CM | POA: Diagnosis not present

## 2017-12-18 DIAGNOSIS — Z7984 Long term (current) use of oral hypoglycemic drugs: Secondary | ICD-10-CM | POA: Diagnosis not present

## 2017-12-18 DIAGNOSIS — Z79899 Other long term (current) drug therapy: Secondary | ICD-10-CM | POA: Insufficient documentation

## 2017-12-18 DIAGNOSIS — I2510 Atherosclerotic heart disease of native coronary artery without angina pectoris: Secondary | ICD-10-CM | POA: Diagnosis not present

## 2017-12-18 DIAGNOSIS — Z8546 Personal history of malignant neoplasm of prostate: Secondary | ICD-10-CM | POA: Insufficient documentation

## 2017-12-18 DIAGNOSIS — Z7982 Long term (current) use of aspirin: Secondary | ICD-10-CM | POA: Diagnosis not present

## 2017-12-18 LAB — GLUCOSE, CAPILLARY
GLUCOSE-CAPILLARY: 157 mg/dL — AB (ref 65–99)
Glucose-Capillary: 101 mg/dL — ABNORMAL HIGH (ref 65–99)

## 2017-12-20 ENCOUNTER — Encounter (HOSPITAL_COMMUNITY)
Admission: RE | Admit: 2017-12-20 | Discharge: 2017-12-20 | Disposition: A | Discharge status: Home / Self Care | Payer: Medicare Other | Source: Ambulatory Visit | Attending: Interventional Cardiology | Admitting: Interventional Cardiology

## 2017-12-20 DIAGNOSIS — I2510 Atherosclerotic heart disease of native coronary artery without angina pectoris: Secondary | ICD-10-CM | POA: Diagnosis not present

## 2017-12-20 DIAGNOSIS — Z951 Presence of aortocoronary bypass graft: Secondary | ICD-10-CM | POA: Diagnosis not present

## 2017-12-20 DIAGNOSIS — Z7984 Long term (current) use of oral hypoglycemic drugs: Secondary | ICD-10-CM | POA: Diagnosis not present

## 2017-12-20 DIAGNOSIS — Z7982 Long term (current) use of aspirin: Secondary | ICD-10-CM | POA: Diagnosis not present

## 2017-12-20 DIAGNOSIS — Z79899 Other long term (current) drug therapy: Secondary | ICD-10-CM | POA: Diagnosis not present

## 2017-12-20 DIAGNOSIS — Z8546 Personal history of malignant neoplasm of prostate: Secondary | ICD-10-CM | POA: Diagnosis not present

## 2017-12-20 LAB — GLUCOSE, CAPILLARY
Glucose-Capillary: 140 mg/dL — ABNORMAL HIGH (ref 65–99)
Glucose-Capillary: 122 mg/dL — ABNORMAL HIGH (ref 65–99)

## 2017-12-23 ENCOUNTER — Encounter (HOSPITAL_COMMUNITY)
Admission: RE | Admit: 2017-12-23 | Discharge: 2017-12-23 | Disposition: A | Discharge status: Home / Self Care | Payer: Medicare Other | Source: Ambulatory Visit | Attending: Interventional Cardiology | Admitting: Interventional Cardiology

## 2017-12-23 DIAGNOSIS — Z7984 Long term (current) use of oral hypoglycemic drugs: Secondary | ICD-10-CM | POA: Diagnosis not present

## 2017-12-23 DIAGNOSIS — Z951 Presence of aortocoronary bypass graft: Secondary | ICD-10-CM | POA: Diagnosis not present

## 2017-12-23 DIAGNOSIS — I2510 Atherosclerotic heart disease of native coronary artery without angina pectoris: Secondary | ICD-10-CM | POA: Diagnosis not present

## 2017-12-23 DIAGNOSIS — Z79899 Other long term (current) drug therapy: Secondary | ICD-10-CM | POA: Diagnosis not present

## 2017-12-23 DIAGNOSIS — Z8546 Personal history of malignant neoplasm of prostate: Secondary | ICD-10-CM | POA: Diagnosis not present

## 2017-12-23 DIAGNOSIS — Z7982 Long term (current) use of aspirin: Secondary | ICD-10-CM | POA: Diagnosis not present

## 2017-12-25 ENCOUNTER — Encounter (HOSPITAL_COMMUNITY)
Admission: RE | Admit: 2017-12-25 | Discharge: 2017-12-25 | Disposition: A | Discharge status: Home / Self Care | Payer: Medicare Other | Source: Ambulatory Visit | Attending: Interventional Cardiology | Admitting: Interventional Cardiology

## 2017-12-25 DIAGNOSIS — Z7982 Long term (current) use of aspirin: Secondary | ICD-10-CM | POA: Diagnosis not present

## 2017-12-25 DIAGNOSIS — I2510 Atherosclerotic heart disease of native coronary artery without angina pectoris: Secondary | ICD-10-CM | POA: Diagnosis not present

## 2017-12-25 DIAGNOSIS — Z7984 Long term (current) use of oral hypoglycemic drugs: Secondary | ICD-10-CM | POA: Diagnosis not present

## 2017-12-25 DIAGNOSIS — Z951 Presence of aortocoronary bypass graft: Secondary | ICD-10-CM | POA: Diagnosis not present

## 2017-12-25 DIAGNOSIS — Z79899 Other long term (current) drug therapy: Secondary | ICD-10-CM | POA: Diagnosis not present

## 2017-12-25 DIAGNOSIS — Z8546 Personal history of malignant neoplasm of prostate: Secondary | ICD-10-CM | POA: Diagnosis not present

## 2017-12-27 ENCOUNTER — Encounter (HOSPITAL_COMMUNITY)
Admission: RE | Admit: 2017-12-27 | Discharge: 2017-12-27 | Disposition: A | Discharge status: Home / Self Care | Payer: Medicare Other | Source: Ambulatory Visit | Attending: Interventional Cardiology | Admitting: Interventional Cardiology

## 2017-12-27 DIAGNOSIS — Z8546 Personal history of malignant neoplasm of prostate: Secondary | ICD-10-CM | POA: Diagnosis not present

## 2017-12-27 DIAGNOSIS — Z7984 Long term (current) use of oral hypoglycemic drugs: Secondary | ICD-10-CM | POA: Diagnosis not present

## 2017-12-27 DIAGNOSIS — Z79899 Other long term (current) drug therapy: Secondary | ICD-10-CM | POA: Diagnosis not present

## 2017-12-27 DIAGNOSIS — I2510 Atherosclerotic heart disease of native coronary artery without angina pectoris: Secondary | ICD-10-CM | POA: Diagnosis not present

## 2017-12-27 DIAGNOSIS — Z951 Presence of aortocoronary bypass graft: Secondary | ICD-10-CM

## 2017-12-27 DIAGNOSIS — Z7982 Long term (current) use of aspirin: Secondary | ICD-10-CM | POA: Diagnosis not present

## 2017-12-27 NOTE — Progress Notes (Signed)
Reviewed home exercise guidelines with patient including endpoints, temperature precautions, target heart rate and rate of perceived exertion. Pt plans to walk as his mode of home exercise. Pt voices understanding of instructions given.   Griffith Santilli M Eveline Sauve, MS, ACSM CEP  

## 2017-12-30 ENCOUNTER — Encounter (HOSPITAL_COMMUNITY)
Admission: RE | Admit: 2017-12-30 | Discharge: 2017-12-30 | Disposition: A | Discharge status: Home / Self Care | Payer: Medicare Other | Source: Ambulatory Visit | Attending: Interventional Cardiology | Admitting: Interventional Cardiology

## 2017-12-30 DIAGNOSIS — I2510 Atherosclerotic heart disease of native coronary artery without angina pectoris: Secondary | ICD-10-CM | POA: Diagnosis not present

## 2017-12-30 DIAGNOSIS — Z8546 Personal history of malignant neoplasm of prostate: Secondary | ICD-10-CM | POA: Diagnosis not present

## 2017-12-30 DIAGNOSIS — Z951 Presence of aortocoronary bypass graft: Secondary | ICD-10-CM | POA: Diagnosis not present

## 2017-12-30 DIAGNOSIS — Z7984 Long term (current) use of oral hypoglycemic drugs: Secondary | ICD-10-CM | POA: Diagnosis not present

## 2017-12-30 DIAGNOSIS — Z79899 Other long term (current) drug therapy: Secondary | ICD-10-CM | POA: Diagnosis not present

## 2017-12-30 DIAGNOSIS — Z7982 Long term (current) use of aspirin: Secondary | ICD-10-CM | POA: Diagnosis not present

## 2017-12-31 NOTE — Progress Notes (Signed)
Cardiology Office Note    Date:  01/01/2018   ID:  Larry [name], DOB 1941/05/29, MRN [id number]  PCP:  Cassandria Anger, MD  Cardiologist: Sinclair Grooms, MD   Chief Complaint  Patient presents with  . Coronary Artery Disease    History of Present Illness:  Larry [name] is a 77 y.o. male with CAD, coronary bypass grafting 2018 (LIMA to LAD, SVG to Ramus Intermediate, SVG to OM, and SVG to RCA), obesity, type 2 diabetes, chronic combined systolic and diastolic heart failure (most recent hospital stay August 25, 2017,, carotid artery disease status post left carotid endarterectomy January 2019 and severe obesity.  He is doing well.  He had acute on chronic combined systolic and diastolic heart failure following carotid endarterectomy in January which is completely resolved.  He looks great today.  He is ambulating without difficulty.  He denies chest pain.  No palpitations or tachycardia.   Past Medical History:  Diagnosis Date  . Carotid stenosis, left   . Cataracts, bilateral    removed  . Coronary artery disease   . Hyperlipemia   . Prostate CA ([place])   . Sleep apnea    Dr Ledora Bottcher ; uses CPAP  . Type II or unspecified type diabetes mellitus without mention of complication, not stated as uncontrolled   . Unspecified essential hypertension     Past Surgical History:  Procedure Laterality Date  . APPENDECTOMY    . CARDIAC CATHETERIZATION    . CATARACT EXTRACTION, BILATERAL    . CORONARY ARTERY BYPASS GRAFT N/A 06/27/2017   Procedure: CORONARY ARTERY BYPASS GRAFTING (CABG), time four   using the left internal mammary artery, and right saphenous leg vein harvested endoscopically.;  Surgeon: Ivin Poot, MD;  Location: [facility];  Service: Open Heart Surgery;  Laterality: N/A;  . CYSTOSCOPY N/A 06/27/2017   Procedure: CYSTOSCOPY FLEXIBLE, Balloon dilatation placement of foley catheter;  Surgeon: Ivin Poot, MD;  Location: [facility];  Service: Open Heart Surgery;   Laterality: N/A;  . ENDARTERECTOMY Left 08/22/2017   Procedure: ENDARTERECTOMY CAROTID LEFT;  Surgeon: Serafina Mitchell, MD;  Location: [facility];  Service: Vascular;  Laterality: Left;  . INTRAVASCULAR ULTRASOUND/IVUS N/A 06/24/2017   Procedure: Intravascular Ultrasound/IVUS;  Surgeon: Belva Crome, MD;  Location: [facility];  Service: Cardiovascular;  Laterality: N/A;  LEFT MAIN  . LEFT HEART CATH AND CORONARY ANGIOGRAPHY N/A 06/24/2017   Procedure: LEFT HEART CATH AND CORONARY ANGIOGRAPHY;  Surgeon: Belva Crome, MD;  Location: [facility];  Service: Cardiovascular;  Laterality: N/A;  . PATCH ANGIOPLASTY Left 08/22/2017   Procedure: PATCH ANGIOPLASTY USING Rueben Bash BIOLOGIC PATCH;  Surgeon: Serafina Mitchell, MD;  Location: [facility];  Service: Vascular;  Laterality: Left;  . PROSTATECTOMY    . TEE WITHOUT CARDIOVERSION N/A 06/27/2017   Procedure: TRANSESOPHAGEAL ECHOCARDIOGRAM (TEE);  Surgeon: Prescott Gum, Collier Salina, MD;  Location: [facility];  Service: Open Heart Surgery;  Laterality: N/A;  . TONSILLECTOMY    . ULTRASOUND GUIDANCE FOR VASCULAR ACCESS  06/24/2017   Procedure: Ultrasound Guidance For Vascular Access;  Surgeon: Belva Crome, MD;  Location: [facility];  Service: Cardiovascular;;    Current Medications: Outpatient Medications Prior to Visit  Medication Sig Dispense Refill  . ACCU-CHEK FASTCLIX LANCETS MISC 1 each by Does not apply route 2 (two) times daily. Dx:E11.9 100 each 11  . aspirin EC 325 MG EC tablet Take 1 tablet (325 mg total)  daily by mouth. 30 tablet 0  . Cholecalciferol (VITAMIN D3) 1000 UNITS CAPS Take 1,000 Units by mouth daily.     . furosemide (LASIX) 40 MG tablet Take 40 mg by mouth daily.    [name] glimepiride (AMARYL) 2 MG tablet Take 1 tablet (2 mg total) by mouth 2 (two) times daily. 180 tablet 3  . glucose blood (ACCU-CHEK SMARTVIEW) test strip 1 each by Other route 2 (two) times daily. Use to check blood sugars twice a day Dx E11.9 100 each 3  . glucose  blood test strip 1 each by Other route 2 (two) times daily. Please dispense brand of choice per pt and insurance preference. Dx:250.00 100 each 11  . glucose monitoring kit (FREESTYLE) monitoring kit 1 each by Does not apply route as needed for other. Please dispense brand of choice per ins/patient. Dx: 250.00. 1 each 0  . losartan (COZAAR) 100 MG tablet Take 100 mg by mouth daily.    . metFORMIN (GLUCOPHAGE) 1000 MG tablet TAKE 1 TABLET BY MOUTH TWICE DAILY WITH MEALS (Patient taking differently: TAKE 1 TABLET ('1000mg'$ ) BY MOUTH TWICE DAILY WITH MEALS) 180 tablet 3  . metoprolol tartrate (LOPRESSOR) 50 MG tablet Take 1 tablet (50 mg total) by mouth 2 (two) times daily. 180 tablet 3  . Multiple Vitamins-Minerals (MACULAR VITAMIN BENEFIT) TABS Take 1 tablet by mouth 2 (two) times daily.    . nitroGLYCERIN (NITROSTAT) 0.4 MG SL tablet Place 1 tablet (0.4 mg total) under the tongue every 5 (five) minutes as needed for chest pain. 20 tablet 3  . potassium chloride (K-DUR,KLOR-CON) 10 MEQ tablet Take 10 mEq by mouth daily.  3  . simvastatin (ZOCOR) 40 MG tablet TAKE 1 TABLET BY MOUTH AT BEDTIME (Patient taking differently: TAKE 1 TABLET ('40mg'$ ) BY MOUTH AT BEDTIME) 90 tablet 3  . spironolactone (ALDACTONE) 25 MG tablet Take 0.5 tablets (12.5 mg total) by mouth daily. 15 tablet 3  . furosemide (LASIX) 40 MG tablet Take 1 tablet (40 mg total) by mouth daily. 90 tablet 3  . linaclotide (LINZESS) 290 MCG CAPS capsule Take 1 capsule (290 mcg total) by mouth daily as needed. (Patient not taking: Reported on 01/01/2018) 90 capsule 3  . losartan (COZAAR) 100 MG tablet Take 1 tablet (100 mg total) by mouth daily. 90 tablet 3  . potassium chloride (K-DUR) 10 MEQ tablet Take 1 tablet (10 mEq total) by mouth daily. 30 tablet 3   No facility-administered medications prior to visit.      Allergies:   Amlodipine; Verapamil; Clindamycin hcl; and Contrast media [iodinated diagnostic agents]   Social History    Socioeconomic History  . Marital status: Married    Spouse name: Not on file  . Number of children: Not on file  . Years of education: Not on file  . Highest education level: Bachelor's degree (e.g., BA, AB, BS)  Occupational History  . Occupation: Teaching laboratory technician  . Financial resource strain: Not hard at all  . Food insecurity:    Worry: Never true    Inability: Never true  . Transportation needs:    Medical: Not on file    Non-medical: Not on file  Tobacco Use  . Smoking status: Never Smoker  . Smokeless tobacco: Never Used  Substance and Sexual Activity  . Alcohol use: No    Alcohol/week: 0.0 oz  . Drug use: No  . Sexual activity: Yes  Lifestyle  . Physical activity:    Days per week: 0  days    Minutes per session: 0 min  . Stress: Not at all  Relationships  . Social connections:    Talks on phone: Not on file    Gets together: Not on file    Attends religious service: Not on file    Active member of club or organization: Not on file    Attends meetings of clubs or organizations: Not on file    Relationship status: Not on file  Other Topics Concern  . Not on file  Social History Narrative   Regular Exercise- no, golf     Family History:  The patient's family history includes Diabetes in his unknown relative; Heart disease in his mother; Hypertension in his unknown relative.   ROS:   Please see the history of present illness.    None  All other systems reviewed and are negative.   PHYSICAL EXAM:   VS:  BP 124/72   Pulse 66   Ht 6' (1.829 m)   Wt 234 lb (106.1 kg)   BMI 31.74 kg/m    GEN: Well nourished, well developed, in no acute distress  HEENT: normal  Neck: no JVD, carotid bruits, or masses Cardiac: RRR; no murmurs, rubs, or gallops,no edema  Respiratory:  clear to auscultation bilaterally, normal work of breathing GI: soft, nontender, nondistended, + BS MS: no deformity or atrophy  Skin: warm and dry, no rash Neuro:  Alert and  Oriented x 3, Strength and sensation are intact Psych: euthymic mood, full affect  Wt Readings from Last 3 Encounters:  01/01/18 234 lb (106.1 kg)  12/10/17 234 lb 5.6 oz (106.3 kg)  11/26/17 225 lb (102.1 kg)      Studies/Labs Reviewed:   EKG:  EKG  None  Recent Labs: 06/25/2017: TSH 0.879 08/25/2017: B Natriuretic Peptide 2,945.1 08/26/2017: ALT 13; Magnesium 1.6 08/27/2017: Hemoglobin 11.6; Platelets 291 09/18/2017: NT-Pro BNP 3,601 09/27/2017: BUN 28; Creatinine, Ser 1.25; Potassium 4.9; Sodium 141   Lipid Panel    Component Value Date/Time   CHOL 128 08/06/2017 1518   TRIG 117 08/06/2017 1518   HDL 39 (L) 08/06/2017 1518   CHOLHDL 3.3 08/06/2017 1518   CHOLHDL 3 06/18/2017 0847   VLDL 21.0 06/18/2017 0847   LDLCALC 66 08/06/2017 1518    Additional studies/ records that were reviewed today include:  09/30/2017: Echocardiogram:  Study Conclusions   - Left ventricle: The cavity size was normal. There was moderate   concentric hypertrophy. Systolic function was mildly reduced. The   estimated ejection fraction was in the range of 45% to 50%. There   is hypokinesis in the basal and mid anteroseptal, anterior and   apical septal walls. Doppler parameters are consistent with   abnormal left ventricular relaxation (grade 1 diastolic   dysfunction). There was no evidence of elevated ventricular   filling pressure by Doppler parameters. - Aortic valve: There was no regurgitation. - Mitral valve: Calcified annulus. Mildly thickened leaflets .   There was trivial regurgitation. - Left atrium: The atrium was moderately dilated. - Right ventricle: Systolic function was normal. - Right atrium: The atrium was normal in size. - Tricuspid valve: There was trivial regurgitation. - Pulmonary arteries: Systolic pressure was within the normal   range. - Pericardium, extracardiac: There was no pericardial effusion.   Impressions:   - Since the last exam on 06/26/2017 LVEF has mildly  improved from   40-45% to 45-50%.. EF by cath prior to CABG was 35 to 40%.  ASSESSMENT:    1. S/P CABG x 4   2. Essential hypertension   3. Chronic combined systolic and diastolic heart failure ([place])   4. OBSTRUCTIVE SLEEP APNEA   5. DOE (dyspnea on exertion)   6. Type 2 diabetes mellitus with complication, without long-term current use of insulin ([facility])      PLAN:  In order of problems listed above:  1. Stable without recurrent angina. 2. Excellent with blood pressure at target less than 130/80 mmHg. 3. No evidence of volume overload on low-dose spironolactone and furosemide 40 mg/day.  No change in diuretic regimen or heart failure therapy at this time.  Clinical follow-up in 6 months with team member, Tera Helper.  Medication Adjustments/Labs and Tests Ordered: Current medicines are reviewed at length with the patient today.  Concerns regarding medicines are outlined above.  Medication changes, Labs and Tests ordered today are listed in the Patient Instructions below. Patient Instructions  Medication Instructions:  Your physician recommends that you continue on your current medications as directed. Please refer to the Current Medication list given to you today.  Labwork: None  Testing/Procedures: None  Follow-Up: Your physician wants you to follow-up in: 6 months with Dr. Tamala Julian.  You will receive a reminder letter in the mail two months in advance. If you don't receive a letter, please call our office to schedule the follow-up appointment.   Any Other Special Instructions Will Be Listed Below (If Applicable).     If you need a refill on your cardiac medications before your next appointment, please call your pharmacy.      Signed, Sinclair Grooms, MD  01/01/2018 11:31 AM    [facility] [address], [place], [place]  [address] Phone: [phone]; Fax: [phone]

## 2018-01-01 ENCOUNTER — Encounter (HOSPITAL_COMMUNITY)
Admission: RE | Admit: 2018-01-01 | Discharge: 2018-01-01 | Disposition: A | Discharge status: Home / Self Care | Payer: Medicare Other | Source: Ambulatory Visit | Attending: Interventional Cardiology | Admitting: Interventional Cardiology

## 2018-01-01 ENCOUNTER — Encounter (INDEPENDENT_AMBULATORY_CARE_PROVIDER_SITE_OTHER): Payer: Self-pay

## 2018-01-01 ENCOUNTER — Ambulatory Visit (INDEPENDENT_AMBULATORY_CARE_PROVIDER_SITE_OTHER): Payer: Medicare Other | Admitting: Interventional Cardiology

## 2018-01-01 ENCOUNTER — Encounter: Payer: Self-pay | Admitting: Interventional Cardiology

## 2018-01-01 VITALS — BP 124/72 | HR 66 | Ht 72.0 in | Wt 234.0 lb

## 2018-01-01 DIAGNOSIS — Z7984 Long term (current) use of oral hypoglycemic drugs: Secondary | ICD-10-CM | POA: Diagnosis not present

## 2018-01-01 DIAGNOSIS — E118 Type 2 diabetes mellitus with unspecified complications: Secondary | ICD-10-CM | POA: Diagnosis not present

## 2018-01-01 DIAGNOSIS — I2510 Atherosclerotic heart disease of native coronary artery without angina pectoris: Secondary | ICD-10-CM | POA: Diagnosis not present

## 2018-01-01 DIAGNOSIS — Z8546 Personal history of malignant neoplasm of prostate: Secondary | ICD-10-CM | POA: Diagnosis not present

## 2018-01-01 DIAGNOSIS — Z7982 Long term (current) use of aspirin: Secondary | ICD-10-CM | POA: Diagnosis not present

## 2018-01-01 DIAGNOSIS — Z951 Presence of aortocoronary bypass graft: Secondary | ICD-10-CM | POA: Diagnosis not present

## 2018-01-01 DIAGNOSIS — I259 Chronic ischemic heart disease, unspecified: Secondary | ICD-10-CM

## 2018-01-01 DIAGNOSIS — G4733 Obstructive sleep apnea (adult) (pediatric): Secondary | ICD-10-CM

## 2018-01-01 DIAGNOSIS — I10 Essential (primary) hypertension: Secondary | ICD-10-CM

## 2018-01-01 DIAGNOSIS — I5042 Chronic combined systolic (congestive) and diastolic (congestive) heart failure: Secondary | ICD-10-CM | POA: Diagnosis not present

## 2018-01-01 DIAGNOSIS — Z79899 Other long term (current) drug therapy: Secondary | ICD-10-CM | POA: Diagnosis not present

## 2018-01-01 NOTE — Patient Instructions (Signed)

## 2018-01-02 NOTE — Progress Notes (Signed)
Cardiac Individual Treatment Plan  Patient Details  Name: Larry [name] MRN: [id number] Date of Birth: October 25, 1940 Referring Provider:     [place] from 12/10/2017 in [facility] 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  ACCU-CHEK FASTCLIX LANCETS MISC, 1 each by Does not apply route 2 (two) times daily. Dx:E11.9, Disp: 100 each, Rfl: 11 .  aspirin EC 325 MG EC tablet, Take 1 tablet (325 mg total) daily by mouth., Disp: 30 tablet, Rfl: 0 .  Cholecalciferol (VITAMIN D3) 1000 UNITS CAPS, Take 1,000 Units by mouth daily. , Disp: , Rfl:  .  furosemide (LASIX) 40 MG tablet, Take 40 mg by mouth daily., Disp: , Rfl:  .  glimepiride (AMARYL) 2 MG tablet, Take 1 tablet (2 mg total) by mouth 2 (two) times daily., Disp: 180 tablet, Rfl: 3 .  glucose blood (ACCU-CHEK SMARTVIEW) test strip, 1 each by Other route 2 (two) times daily. Use to check blood sugars twice a day Dx E11.9, Disp: 100 each, Rfl: 3 .  glucose blood test strip, 1 each by Other route 2 (two) times daily. Please dispense brand of choice per pt and insurance preference. Dx:250.00, Disp: 100 each, Rfl: 11 .  glucose monitoring kit (FREESTYLE) monitoring kit, 1 each by Does not apply route as needed for other. Please dispense brand of choice per ins/patient. Dx: 250.00., Disp: 1 each, Rfl: 0 .  losartan (COZAAR) 100 MG tablet, Take 100 mg by mouth daily., Disp: , Rfl:  .  metFORMIN (GLUCOPHAGE) 1000 MG tablet, TAKE 1 TABLET BY MOUTH TWICE DAILY WITH MEALS (Patient taking differently: TAKE 1 TABLET (1037m) BY MOUTH TWICE DAILY WITH MEALS), Disp: 180 tablet,  Rfl: 3 .  metoprolol tartrate (LOPRESSOR) 50 MG tablet, Take 1 tablet (50 mg total) by mouth 2 (two) times daily., Disp: 180 tablet, Rfl: 3 .  Multiple Vitamins-Minerals (MACULAR VITAMIN BENEFIT) TABS, Take 1 tablet by mouth 2 (two) times daily., Disp: , Rfl:  .  nitroGLYCERIN (NITROSTAT) 0.4 MG SL tablet, Place 1 tablet (0.4 mg total) under the tongue every 5 (five) minutes as needed for chest pain., Disp: 20 tablet, Rfl: 3 .  potassium chloride (K-DUR,KLOR-CON) 10 MEQ tablet, Take 10 mEq by mouth daily., Disp: , Rfl: 3 .  simvastatin (ZOCOR) 40 MG tablet, TAKE 1 TABLET BY MOUTH AT BEDTIME (Patient taking differently: TAKE 1 TABLET (43m BY MOUTH AT BEDTIME), Disp: 90 tablet, Rfl: 3 .  spironolactone (ALDACTONE) 25 MG tablet, Take 0.5 tablets (12.5 mg total) by mouth daily., Disp: 15 tablet, Rfl: 3  Past Medical History: Past Medical History:  Diagnosis Date  . Carotid stenosis, left   . Cataracts, bilateral    removed  . Coronary artery disease   . Hyperlipemia   [place]  . Sleep apnea    Dr ChLedora Bottcher uses CPAP  . Type II or unspecified type diabetes mellitus without mention of complication, not stated as uncontrolled   . Unspecified essential hypertension     Tobacco Use: Social History   Tobacco Use  Smoking Status Never Smoker  Smokeless Tobacco  Never Used    Labs: Recent Review Flowsheet Data    Labs for ITP Cardiac and Pulmonary Rehab Latest Ref Rng & Units 06/28/2017 06/28/2017 06/30/2017 08/06/2017 08/25/2017   Cholestrol 100 - 199 mg/dL - - - 128 -   LDLCALC 0 - 99 mg/dL - - - 66 -   HDL >39 mg/dL - - - 39(L) -   Trlycerides 0 - 149 mg/dL - - - 117 -   Hemoglobin A1c 4.8 - 5.6 % - - - - 6.2(H)   PHART 7.350 - 7.450 7.396 - - - -   PCO2ART 32.0 - 48.0 mmHg 39.9 - - - -   HCO3 20.0 - 28.0 mmol/L 24.3 - - - -   TCO2 22 - 32 mmol/L 26 24 - - -   ACIDBASEDEF 0.0 - 2.0 mmol/L - - - - -   O2SAT % 94.0 - 57.2 - -      Capillary Blood Glucose: Lab Results   Component Value Date   GLUCAP 122 (H) 12/20/2017   GLUCAP 140 (H) 12/20/2017   GLUCAP 101 (H) 12/18/2017   GLUCAP 157 (H) 12/18/2017   GLUCAP 103 (H) 12/16/2017     Exercise Target Goals:    Exercise Program Goal: Individual exercise prescription set using results from initial 6 min walk test and THRR while considering  patient's activity barriers and safety.   Exercise Prescription Goal: Initial exercise prescription builds to 30-45 minutes a day of aerobic activity, 2-3 days per week.  Home exercise guidelines will be given to patient during program as part of exercise prescription that the participant will acknowledge.  Activity Barriers & Risk Stratification: Activity Barriers & Cardiac Risk Stratification - 12/10/17 1120      Activity Barriers & Cardiac Risk Stratification   Activity Barriers  None    Cardiac Risk Stratification  High       6 Minute Walk: 6 Minute Walk    Row Name 12/10/17 1116         6 Minute Walk   Phase  Initial     Distance  1482 feet     Walk Time  6 minutes     # of Rest Breaks  0     MPH  2.81     METS  2.74     RPE  12     VO2 Peak  9.6     Symptoms  No     Resting HR  65 bpm     Resting BP  130/70     Resting Oxygen Saturation   99 %     Exercise Oxygen Saturation  during 6 min walk  95 %     Max Ex. HR  89 bpm     Max Ex. BP  156/68     2 Minute Post BP  122/60        Oxygen Initial Assessment:   Oxygen Re-Evaluation:   Oxygen Discharge (Final Oxygen Re-Evaluation):   Initial Exercise Prescription: Initial Exercise Prescription - 12/10/17 1100      Date of Initial Exercise RX and Referring Provider   Date  12/10/17    Referring Provider  Daneen Schick, MD      Treadmill   MPH  2.1    Grade  0.5    Minutes  10    METs  2.75      Bike   Level  0.7    Minutes  10    METs  2.26  NuStep   Level  3    SPM  85    Minutes  10    METs  2.5      Prescription Details   Frequency (times per week)  3     Duration  Progress to 30 minutes of continuous aerobic without signs/symptoms of physical distress      Intensity   THRR 40-80% of Max Heartrate  58-115    Ratings of Perceived Exertion  11-13    Perceived Dyspnea  0-4      Progression   Progression  Continue to progress workloads to maintain intensity without signs/symptoms of physical distress.      Resistance Training   Training Prescription  Yes    Weight  3lbs    Reps  10-15       Perform Capillary Blood Glucose checks as needed.  Exercise Prescription Changes: Exercise Prescription Changes    Row Name 12/23/17 1451             Response to Exercise   Blood Pressure (Admit)  124/52       Blood Pressure (Exercise)  144/68       Blood Pressure (Exit)  112/62       Heart Rate (Admit)  72 bpm       Heart Rate (Exercise)  103 bpm       Heart Rate (Exit)  75 bpm       Rating of Perceived Exertion (Exercise)  12       Symptoms  none       Duration  Progress to 30 minutes of  aerobic without signs/symptoms of physical distress       Intensity  THRR unchanged         Progression   Progression  Continue to progress workloads to maintain intensity without signs/symptoms of physical distress.       Average METs  2.6         Resistance Training   Training Prescription  Yes       Weight  3lbs       Reps  10-15       Time  10 Minutes         Interval Training   Interval Training  No         Treadmill   MPH  2.1       Grade  0.5       Minutes  10       METs  2.9         Bike   Level  0.9       Minutes  10       METs  2.62         NuStep   Level  3       SPM  85       Minutes  10       METs  2.4          Exercise Comments: Exercise Comments    Row Name 12/25/17 1500 12/27/17 1508         Exercise Comments  Reviewed METs and goals with patient.  Reviewed home exercise guidelines with patient.         Exercise Goals and Review: Exercise Goals    Row Name 12/10/17 1123             Exercise  Goals   Increase Physical Activity  Yes       Intervention  Provide advice, education, support and counseling about physical activity/exercise needs.;Develop an individualized exercise prescription for aerobic and resistive training based on initial evaluation findings, risk stratification, comorbidities and participant's personal goals.       Expected Outcomes  Long Term: Exercising regularly at least 3-5 days a week.;Short Term: Attend rehab on a regular basis to increase amount of physical activity.;Long Term: Add in home exercise to make exercise part of routine and to increase amount of physical activity.       Increase Strength and Stamina  Yes       Intervention  Provide advice, education, support and counseling about physical activity/exercise needs.;Develop an individualized exercise prescription for aerobic and resistive training based on initial evaluation findings, risk stratification, comorbidities and participant's personal goals.       Expected Outcomes  Short Term: Increase workloads from initial exercise prescription for resistance, speed, and METs.;Long Term: Improve cardiorespiratory fitness, muscular endurance and strength as measured by increased METs and functional capacity (6MWT)       Able to understand and use rate of perceived exertion (RPE) scale  Yes       Intervention  Provide education and explanation on how to use RPE scale       Expected Outcomes  Short Term: Able to use RPE daily in rehab to express subjective intensity level;Long Term:  Able to use RPE to guide intensity level when exercising independently       Knowledge and understanding of Target Heart Rate Range (THRR)  Yes       Intervention  Provide education and explanation of THRR including how the numbers were predicted and where they are located for reference       Expected Outcomes  Short Term: Able to state/look up THRR;Long Term: Able to use THRR to govern intensity when exercising independently;Short Term:  Able to use daily as guideline for intensity in rehab       Able to check pulse independently  Yes       Intervention  Review the importance of being able to check your own pulse for safety during independent exercise;Provide education and demonstration on how to check pulse in carotid and radial arteries.       Expected Outcomes  Short Term: Able to explain why pulse checking is important during independent exercise;Long Term: Able to check pulse independently and accurately       Understanding of Exercise Prescription  Yes       Intervention  Provide education, explanation, and written materials on patient's individual exercise prescription       Expected Outcomes  Short Term: Able to explain program exercise prescription;Long Term: Able to explain home exercise prescription to exercise independently          Exercise Goals Re-Evaluation : Exercise Goals Re-Evaluation    Row Name 12/25/17 1500 12/27/17 1508           Exercise Goal Re-Evaluation   Exercise Goals Review  Increase Physical Activity  Increase Physical Activity      Comments  Patient is not currently exercisng on the days he doesn't attend cardiac rehab. Will review home exercise guidelines with patient. Pt understands and is able to use the RPE scale appropriately.  Reviewed home exercise guidelines with patient including THRR, RPE scale, and endpoints for exercise.       Expected Outcomes  Increase workloads as tolerated.  Patient will add 1-2 days walking for 30 minutes to help increase strength and endurance.  Discharge Exercise Prescription (Final Exercise Prescription Changes): Exercise Prescription Changes - 12/23/17 1451      Response to Exercise   Blood Pressure (Admit)  124/52    Blood Pressure (Exercise)  144/68    Blood Pressure (Exit)  112/62    Heart Rate (Admit)  72 bpm    Heart Rate (Exercise)  103 bpm    Heart Rate (Exit)  75 bpm    Rating of Perceived Exertion (Exercise)  12    Symptoms   none    Duration  Progress to 30 minutes of  aerobic without signs/symptoms of physical distress    Intensity  THRR unchanged      Progression   Progression  Continue to progress workloads to maintain intensity without signs/symptoms of physical distress.    Average METs  2.6      Resistance Training   Training Prescription  Yes    Weight  3lbs    Reps  10-15    Time  10 Minutes      Interval Training   Interval Training  No      Treadmill   MPH  2.1    Grade  0.5    Minutes  10    METs  2.9      Bike   Level  0.9    Minutes  10    METs  2.62      NuStep   Level  3    SPM  85    Minutes  10    METs  2.4       Nutrition:  Target Goals: Understanding of nutrition guidelines, daily intake of sodium <1570m, cholesterol <2044m calories 30% from fat and 7% or less from saturated fats, daily to have 5 or more servings of fruits and vegetables.  Biometrics: Pre Biometrics - 12/10/17 1124      Pre Biometrics   Height  6' (1.829 m)    Weight  234 lb 5.6 oz (106.3 kg)    Waist Circumference  43.5 inches    Hip Circumference  44.5 inches    Waist to Hip Ratio  0.98 %    BMI (Calculated)  31.78    Triceps Skinfold  28 mm    % Body Fat  33.1 %    Grip Strength  39.5 kg    Flexibility  11 in    Single Leg Stand  6.76 seconds        Nutrition Therapy Plan and Nutrition Goals: Nutrition Therapy & Goals - 12/16/17 0933      Nutrition Therapy   Diet  Consistent Carb, Heart Healthy      Personal Nutrition Goals   Nutrition Goal  Pt to identify and limit food sources of saturated fat, trans fat, and sodium    Personal Goal #2  Pt to identify food quantities necessary to achieve weight loss of 6-24 lb (2.7-10.9 kg) at graduation from cardiac rehab. Goal wt of 200 lb desired.       Intervention Plan   Intervention  Prescribe, educate and counsel regarding individualized specific dietary modifications aiming towards targeted core components such as weight, hypertension,  lipid management, diabetes, heart failure and other comorbidities.    Expected Outcomes  Short Term Goal: Understand basic principles of dietary content, such as calories, fat, sodium, cholesterol and nutrients.;Long Term Goal: Adherence to prescribed nutrition plan.       Nutrition Assessments: Nutrition Assessments - 12/16/17 0933      MEDFICTS Scores  Pre Score  --  (Significant)  returned incomplete       Nutrition Goals Re-Evaluation:   Nutrition Goals Re-Evaluation:   Nutrition Goals Discharge (Final Nutrition Goals Re-Evaluation):   Psychosocial: Target Goals: Acknowledge presence or absence of significant depression and/or stress, maximize coping skills, provide positive support system. Participant is able to verbalize types and ability to use techniques and skills needed for reducing stress and depression.  Initial Review & Psychosocial Screening: Initial Psych Review & Screening - 12/10/17 0844      Initial Review   Current issues with  None Identified      Family Dynamics   Good Support System?  Yes spouse, family      Barriers   Psychosocial barriers to participate in program  There are no identifiable barriers or psychosocial needs.      Screening Interventions   Interventions  Encouraged to exercise       Quality of Life Scores: Quality of Life - 12/10/17 1130      Quality of Life Scores   Health/Function Pre  26.47 %    Socioeconomic Pre  24.86 %    Psych/Spiritual Pre  28.57 %    Family Pre  26.4 %    GLOBAL Pre  26.56 %      Scores of 19 and below usually indicate a poorer quality of life in these areas.  A difference of  2-3 points is a clinically meaningful difference.  A difference of 2-3 points in the total score of the Quality of Life Index has been associated with significant improvement in overall quality of life, self-image, physical symptoms, and general health in studies assessing change in quality of life.  PHQ-9: Recent Review  Flowsheet Data    Depression screen [facility] 2/9 12/16/2017 08/29/2017 01/09/2016   Decreased Interest 0 0 1   Down, Depressed, Hopeless 0 0 0   PHQ - 2 Score 0 0 1     Interpretation of Total Score  Total Score Depression Severity:  1-4 = Minimal depression, 5-9 = Mild depression, 10-14 = Moderate depression, 15-19 = Moderately severe depression, 20-27 = Severe depression   Psychosocial Evaluation and Intervention:   Psychosocial Re-Evaluation: Psychosocial Re-Evaluation    [place] Name 01/02/18 1434             Psychosocial Re-Evaluation   Current issues with  None Identified       Interventions  Encouraged to attend Cardiac Rehabilitation for the exercise       Continue Psychosocial Services   No Follow up required          Psychosocial Discharge (Final Psychosocial Re-Evaluation): Psychosocial Re-Evaluation - 01/02/18 1434      Psychosocial Re-Evaluation   Current issues with  None Identified    Interventions  Encouraged to attend Cardiac Rehabilitation for the exercise    Continue Psychosocial Services   No Follow up required       Vocational Rehabilitation: Provide vocational rehab assistance to qualifying candidates.   Vocational Rehab Evaluation & Intervention: Vocational Rehab - 12/10/17 0844      Initial Vocational Rehab Evaluation & Intervention   Assessment shows need for Vocational Rehabilitation  No       Education: Education Goals: Education classes will be provided on a weekly basis, covering required topics. Participant will state understanding/return demonstration of topics presented.  Learning Barriers/Preferences: Learning Barriers/Preferences - 12/09/17 1518      Learning Barriers/Preferences   Learning Barriers  None    Learning  Preferences  Skilled Demonstration       Education Topics: Count Your Pulse:  -Group instruction provided by verbal instruction, demonstration, patient participation and written materials to support subject.   Instructors address importance of being able to find your pulse and how to count your pulse when at home without a heart monitor.  Patients get hands on experience counting their pulse with staff help and individually.   Heart Attack, Angina, and Risk Factor Modification:  -Group instruction provided by verbal instruction, video, and written materials to support subject.  Instructors address signs and symptoms of angina and heart attacks.    Also discuss risk factors for heart disease and how to make changes to improve heart health risk factors.   Functional Fitness:  -Group instruction provided by verbal instruction, demonstration, patient participation, and written materials to support subject.  Instructors address safety measures for doing things around the house.  Discuss how to get up and down off the floor, how to pick things up properly, how to safely get out of a chair without assistance, and balance training.   Meditation and Mindfulness:  -Group instruction provided by verbal instruction, patient participation, and written materials to support subject.  Instructor addresses importance of mindfulness and meditation practice to help reduce stress and improve awareness.  Instructor also leads participants through a meditation exercise.    Stretching for Flexibility and Mobility:  -Group instruction provided by verbal instruction, patient participation, and written materials to support subject.  Instructors lead participants through series of stretches that are designed to increase flexibility thus improving mobility.  These stretches are additional exercise for major muscle groups that are typically performed during regular warm up and cool down.   Hands Only CPR:  -Group verbal, video, and participation provides a basic overview of AHA guidelines for community CPR. Role-play of emergencies allow participants the opportunity to practice calling for help and chest compression technique with  discussion of AED use.   Hypertension: -Group verbal and written instruction that provides a basic overview of hypertension including the most recent diagnostic guidelines, risk factor reduction with self-care instructions and medication management.   CARDIAC REHAB PHASE II EXERCISE from 12/27/2017 in [facility]  Date  12/20/17  Instruction Review Code  2- Demonstrated Understanding       Nutrition I class: Heart Healthy Eating:  -Group instruction provided by PowerPoint slides, verbal discussion, and written materials to support subject matter. The instructor gives an explanation and review of the Therapeutic Lifestyle Changes diet recommendations, which includes a discussion on lipid goals, dietary fat, sodium, fiber, plant stanol/sterol esters, sugar, and the components of a well-balanced, healthy diet.   Nutrition II class: Lifestyle Skills:  -Group instruction provided by PowerPoint slides, verbal discussion, and written materials to support subject matter. The instructor gives an explanation and review of label reading, grocery shopping for heart health, heart healthy recipe modifications, and ways to make healthier choices when eating out.   Diabetes Question & Answer:  -Group instruction provided by PowerPoint slides, verbal discussion, and written materials to support subject matter. The instructor gives an explanation and review of diabetes co-morbidities, pre- and post-prandial blood glucose goals, pre-exercise blood glucose goals, signs, symptoms, and treatment of hypoglycemia and hyperglycemia, and foot care basics.   CARDIAC REHAB PHASE II EXERCISE from 12/27/2017 in [place]  Date  12/27/17  Educator  RD  Instruction Review Code  2- Demonstrated Understanding  Diabetes Blitz:  -Group instruction provided by PowerPoint slides, verbal discussion, and written materials to support subject matter. The  instructor gives an explanation and review of the physiology behind type 1 and type 2 diabetes, diabetes medications and rational behind using different medications, pre- and post-prandial blood glucose recommendations and Hemoglobin A1c goals, diabetes diet, and exercise including blood glucose guidelines for exercising safely.    Portion Distortion:  -Group instruction provided by PowerPoint slides, verbal discussion, written materials, and food models to support subject matter. The instructor gives an explanation of serving size versus portion size, changes in portions sizes over the last 20 years, and what consists of a serving from each food group.   Stress Management:  -Group instruction provided by verbal instruction, video, and written materials to support subject matter.  Instructors review role of stress in heart disease and how to cope with stress positively.     Exercising on Your Own:  -Group instruction provided by verbal instruction, power point, and written materials to support subject.  Instructors discuss benefits of exercise, components of exercise, frequency and intensity of exercise, and end points for exercise.  Also discuss use of nitroglycerin and activating EMS.  Review options of places to exercise outside of rehab.  Review guidelines for sex with heart disease.   CARDIAC REHAB PHASE II EXERCISE from 12/27/2017 in [place]  Date  12/18/17  Educator  EP  Instruction Review Code  2- Demonstrated Understanding      Cardiac Drugs I:  -Group instruction provided by verbal instruction and written materials to support subject.  Instructor reviews cardiac drug classes: antiplatelets, anticoagulants, beta blockers, and statins.  Instructor discusses reasons, side effects, and lifestyle considerations for each drug class.   CARDIAC REHAB PHASE II EXERCISE from 12/27/2017 in [place]  Date  12/25/17  Educator  --  [Pharmacy]  Instruction Review Code  2- Demonstrated Understanding      Cardiac Drugs II:  -Group instruction provided by verbal instruction and written materials to support subject.  Instructor reviews cardiac drug classes: angiotensin converting enzyme inhibitors (ACE-I), angiotensin II receptor blockers (ARBs), nitrates, and calcium channel blockers.  Instructor discusses reasons, side effects, and lifestyle considerations for each drug class.   Anatomy and Physiology of the Circulatory System:  Group verbal and written instruction and models provide basic cardiac anatomy and physiology, with the coronary electrical and arterial systems. Review of: AMI, Angina, Valve disease, Heart Failure, Peripheral Artery Disease, Cardiac Arrhythmia, Pacemakers, and the ICD.   Other Education:  -Group or individual verbal, written, or video instructions that support the educational goals of the cardiac rehab program.   Holiday Eating Survival Tips:  -Group instruction provided by PowerPoint slides, verbal discussion, and written materials to support subject matter. The instructor gives patients tips, tricks, and techniques to help them not only survive but enjoy the holidays despite the onslaught of food that accompanies the holidays.   Knowledge Questionnaire Score: Knowledge Questionnaire Score - 12/10/17 0844      Knowledge Questionnaire Score   Pre Score  21/24       Core Components/Risk Factors/Patient Goals at Admission: Personal Goals and Risk Factors at Admission - 12/10/17 1114      Core Components/Risk Factors/Patient Goals on Admission    Weight Management  Yes;Obesity    Intervention  Weight Management: Develop a combined nutrition and exercise program designed to reach desired caloric intake, while maintaining appropriate intake of nutrient and  fiber, sodium and fats, and appropriate energy expenditure required for the weight goal.;Weight Management: Provide education and  appropriate resources to help participant work on and attain dietary goals.;Weight Management/Obesity: Establish reasonable short term and long term weight goals.;Obesity: Provide education and appropriate resources to help participant work on and attain dietary goals.    Admit Weight  234 lb 5.6 oz (106.3 kg)    Goal Weight: Short Term  228 lb (103.4 kg)    Goal Weight: Long Term  200 lb (90.7 kg)    Expected Outcomes  Short Term: Continue to assess and modify interventions until short term weight is achieved;Long Term: Adherence to nutrition and physical activity/exercise program aimed toward attainment of established weight goal;Weight Loss: Understanding of general recommendations for a balanced deficit meal plan, which promotes 1-2 lb weight loss per week and includes a negative energy balance of [phone] kcal/d;Understanding recommendations for meals to include 15-35% energy as protein, 25-35% energy from fat, 35-60% energy from carbohydrates, less than 243m of dietary cholesterol, 20-35 gm of total fiber daily;Understanding of distribution of calorie intake throughout the day with the consumption of 4-5 meals/snacks    Diabetes  Yes    Intervention  Provide education about signs/symptoms and action to take for hypo/hyperglycemia.;Provide education about proper nutrition, including hydration, and aerobic/resistive exercise prescription along with prescribed medications to achieve blood glucose in normal ranges: Fasting glucose 65-99 mg/dL    Expected Outcomes  Short Term: Participant verbalizes understanding of the signs/symptoms and immediate care of hyper/hypoglycemia, proper foot care and importance of medication, aerobic/resistive exercise and nutrition plan for blood glucose control.;Long Term: Attainment of HbA1C < 7%.    Hypertension  Yes    Intervention  Provide education on lifestyle modifcations including regular physical activity/exercise, weight management, moderate sodium restriction and  increased consumption of fresh fruit, vegetables, and low fat dairy, alcohol moderation, and smoking cessation.;Monitor prescription use compliance.    Expected Outcomes  Short Term: Continued assessment and intervention until BP is < 140/981mHG in hypertensive participants. < 130/8051mG in hypertensive participants with diabetes, heart failure or chronic kidney disease.;Long Term: Maintenance of blood pressure at goal levels.    Lipids  Yes    Intervention  Provide education and support for participant on nutrition & aerobic/resistive exercise along with prescribed medications to achieve LDL <83m95mDL >40mg35m Expected Outcomes  Short Term: Participant states understanding of desired cholesterol values and is compliant with medications prescribed. Participant is following exercise prescription and nutrition guidelines.;Long Term: Cholesterol controlled with medications as prescribed, with individualized exercise RX and with personalized nutrition plan. Value goals: LDL < 83mg,21m > 40 mg.       Core Components/Risk Factors/Patient Goals Review:  Goals and Risk Factor Review    Row Name 01/02/18 1431             Core Components/Risk Factors/Patient Goals Review   Personal Goals Review  Weight Management/Obesity;Lipids;Diabetes;Hypertension       Review  Larry [name]'s vital signs and CBG's have been stable at phase 2 cardiac rehab.       Expected Outcomes  Larry [name] wiMikki Santeecontinue to take his medicaitons as presribed and come to cardiac rehab to exercise.          Core Components/Risk Factors/Patient Goals at Discharge (Final Review):  Goals and Risk Factor Review - 01/02/18 1431      Core Components/Risk Factors/Patient Goals Review   Personal Goals Review  Weight Management/Obesity;Lipids;Diabetes;Hypertension    Review  Larry [name]'s vital signs and CBG's have been stable at phase 2 cardiac rehab.    Expected Outcomes  [name] will continue to take his medicaitons as presribed and come to cardiac rehab to  exercise.       ITP Comments: ITP Comments    Row Name 12/10/17 0843 01/02/18 1425         ITP Comments  Dr. Fransico Him, Medical Director   30 Day ITP comment. Patient with good attendance and partcipation in phase 2 cardiac rehab         Comments: See ITP comments.[name], RN,BSN 01/02/2018 2:38 PM

## 2018-01-03 ENCOUNTER — Encounter (HOSPITAL_COMMUNITY)
Admission: RE | Admit: 2018-01-03 | Discharge: 2018-01-03 | Disposition: A | Discharge status: Home / Self Care | Payer: Medicare Other | Source: Ambulatory Visit | Attending: Interventional Cardiology | Admitting: Interventional Cardiology

## 2018-01-03 DIAGNOSIS — Z8546 Personal history of malignant neoplasm of prostate: Secondary | ICD-10-CM | POA: Diagnosis not present

## 2018-01-03 DIAGNOSIS — Z951 Presence of aortocoronary bypass graft: Secondary | ICD-10-CM

## 2018-01-03 DIAGNOSIS — Z7982 Long term (current) use of aspirin: Secondary | ICD-10-CM | POA: Diagnosis not present

## 2018-01-03 DIAGNOSIS — Z79899 Other long term (current) drug therapy: Secondary | ICD-10-CM | POA: Diagnosis not present

## 2018-01-03 DIAGNOSIS — Z7984 Long term (current) use of oral hypoglycemic drugs: Secondary | ICD-10-CM | POA: Diagnosis not present

## 2018-01-03 DIAGNOSIS — I2510 Atherosclerotic heart disease of native coronary artery without angina pectoris: Secondary | ICD-10-CM | POA: Diagnosis not present

## 2018-01-06 ENCOUNTER — Encounter (HOSPITAL_COMMUNITY)
Admission: RE | Admit: 2018-01-06 | Discharge: 2018-01-06 | Disposition: A | Discharge status: Home / Self Care | Payer: Medicare Other | Source: Ambulatory Visit | Attending: Interventional Cardiology | Admitting: Interventional Cardiology

## 2018-01-06 DIAGNOSIS — Z7982 Long term (current) use of aspirin: Secondary | ICD-10-CM | POA: Diagnosis not present

## 2018-01-06 DIAGNOSIS — I2510 Atherosclerotic heart disease of native coronary artery without angina pectoris: Secondary | ICD-10-CM | POA: Diagnosis not present

## 2018-01-06 DIAGNOSIS — Z951 Presence of aortocoronary bypass graft: Secondary | ICD-10-CM

## 2018-01-06 DIAGNOSIS — Z8546 Personal history of malignant neoplasm of prostate: Secondary | ICD-10-CM | POA: Diagnosis not present

## 2018-01-06 DIAGNOSIS — Z7984 Long term (current) use of oral hypoglycemic drugs: Secondary | ICD-10-CM | POA: Diagnosis not present

## 2018-01-06 DIAGNOSIS — Z79899 Other long term (current) drug therapy: Secondary | ICD-10-CM | POA: Diagnosis not present

## 2018-01-08 ENCOUNTER — Encounter (HOSPITAL_COMMUNITY)
Admission: RE | Admit: 2018-01-08 | Discharge: 2018-01-08 | Disposition: A | Discharge status: Home / Self Care | Payer: Medicare Other | Source: Ambulatory Visit | Attending: Interventional Cardiology | Admitting: Interventional Cardiology

## 2018-01-08 DIAGNOSIS — I2510 Atherosclerotic heart disease of native coronary artery without angina pectoris: Secondary | ICD-10-CM | POA: Diagnosis not present

## 2018-01-08 DIAGNOSIS — Z951 Presence of aortocoronary bypass graft: Secondary | ICD-10-CM

## 2018-01-08 DIAGNOSIS — Z7984 Long term (current) use of oral hypoglycemic drugs: Secondary | ICD-10-CM | POA: Diagnosis not present

## 2018-01-08 DIAGNOSIS — Z7982 Long term (current) use of aspirin: Secondary | ICD-10-CM | POA: Diagnosis not present

## 2018-01-08 DIAGNOSIS — Z79899 Other long term (current) drug therapy: Secondary | ICD-10-CM | POA: Diagnosis not present

## 2018-01-08 DIAGNOSIS — Z8546 Personal history of malignant neoplasm of prostate: Secondary | ICD-10-CM | POA: Diagnosis not present

## 2018-01-10 ENCOUNTER — Encounter (HOSPITAL_COMMUNITY)
Admission: RE | Admit: 2018-01-10 | Discharge: 2018-01-10 | Disposition: A | Discharge status: Home / Self Care | Payer: Medicare Other | Source: Ambulatory Visit | Attending: Interventional Cardiology | Admitting: Interventional Cardiology

## 2018-01-10 ENCOUNTER — Other Ambulatory Visit: Payer: Self-pay | Admitting: Interventional Cardiology

## 2018-01-10 DIAGNOSIS — Z8546 Personal history of malignant neoplasm of prostate: Secondary | ICD-10-CM | POA: Diagnosis not present

## 2018-01-10 DIAGNOSIS — Z7982 Long term (current) use of aspirin: Secondary | ICD-10-CM | POA: Diagnosis not present

## 2018-01-10 DIAGNOSIS — Z7984 Long term (current) use of oral hypoglycemic drugs: Secondary | ICD-10-CM | POA: Diagnosis not present

## 2018-01-10 DIAGNOSIS — Z79899 Other long term (current) drug therapy: Secondary | ICD-10-CM | POA: Diagnosis not present

## 2018-01-10 DIAGNOSIS — I2510 Atherosclerotic heart disease of native coronary artery without angina pectoris: Secondary | ICD-10-CM | POA: Diagnosis not present

## 2018-01-10 DIAGNOSIS — Z951 Presence of aortocoronary bypass graft: Secondary | ICD-10-CM | POA: Diagnosis not present

## 2018-01-14 ENCOUNTER — Other Ambulatory Visit: Payer: Self-pay | Admitting: Interventional Cardiology

## 2018-01-15 ENCOUNTER — Encounter (HOSPITAL_COMMUNITY)
Admission: RE | Admit: 2018-01-15 | Discharge: 2018-01-15 | Disposition: A | Discharge status: Home / Self Care | Payer: Medicare Other | Source: Ambulatory Visit | Attending: Interventional Cardiology | Admitting: Interventional Cardiology

## 2018-01-15 DIAGNOSIS — I2510 Atherosclerotic heart disease of native coronary artery without angina pectoris: Secondary | ICD-10-CM | POA: Diagnosis not present

## 2018-01-15 DIAGNOSIS — Z8546 Personal history of malignant neoplasm of prostate: Secondary | ICD-10-CM | POA: Diagnosis not present

## 2018-01-15 DIAGNOSIS — Z7982 Long term (current) use of aspirin: Secondary | ICD-10-CM | POA: Diagnosis not present

## 2018-01-15 DIAGNOSIS — Z951 Presence of aortocoronary bypass graft: Secondary | ICD-10-CM | POA: Diagnosis not present

## 2018-01-15 DIAGNOSIS — Z7984 Long term (current) use of oral hypoglycemic drugs: Secondary | ICD-10-CM | POA: Diagnosis not present

## 2018-01-15 DIAGNOSIS — Z79899 Other long term (current) drug therapy: Secondary | ICD-10-CM | POA: Diagnosis not present

## 2018-01-17 ENCOUNTER — Encounter (HOSPITAL_COMMUNITY)
Admission: RE | Admit: 2018-01-17 | Discharge: 2018-01-17 | Disposition: A | Discharge status: Home / Self Care | Payer: Medicare Other | Source: Ambulatory Visit | Attending: Interventional Cardiology | Admitting: Interventional Cardiology

## 2018-01-17 DIAGNOSIS — Z7982 Long term (current) use of aspirin: Secondary | ICD-10-CM | POA: Diagnosis not present

## 2018-01-17 DIAGNOSIS — Z951 Presence of aortocoronary bypass graft: Secondary | ICD-10-CM

## 2018-01-17 DIAGNOSIS — Z8546 Personal history of malignant neoplasm of prostate: Secondary | ICD-10-CM | POA: Diagnosis not present

## 2018-01-17 DIAGNOSIS — I2510 Atherosclerotic heart disease of native coronary artery without angina pectoris: Secondary | ICD-10-CM | POA: Diagnosis not present

## 2018-01-17 DIAGNOSIS — Z79899 Other long term (current) drug therapy: Secondary | ICD-10-CM | POA: Diagnosis not present

## 2018-01-17 DIAGNOSIS — Z7984 Long term (current) use of oral hypoglycemic drugs: Secondary | ICD-10-CM | POA: Diagnosis not present

## 2018-01-20 ENCOUNTER — Encounter (HOSPITAL_COMMUNITY)
Admission: RE | Admit: 2018-01-20 | Discharge: 2018-01-20 | Disposition: A | Discharge status: Home / Self Care | Payer: Medicare Other | Source: Ambulatory Visit | Attending: Interventional Cardiology | Admitting: Interventional Cardiology

## 2018-01-20 DIAGNOSIS — Z79899 Other long term (current) drug therapy: Secondary | ICD-10-CM | POA: Diagnosis not present

## 2018-01-20 DIAGNOSIS — I2510 Atherosclerotic heart disease of native coronary artery without angina pectoris: Secondary | ICD-10-CM | POA: Insufficient documentation

## 2018-01-20 DIAGNOSIS — Z8546 Personal history of malignant neoplasm of prostate: Secondary | ICD-10-CM | POA: Diagnosis not present

## 2018-01-20 DIAGNOSIS — Z951 Presence of aortocoronary bypass graft: Secondary | ICD-10-CM | POA: Insufficient documentation

## 2018-01-20 DIAGNOSIS — Z7982 Long term (current) use of aspirin: Secondary | ICD-10-CM | POA: Insufficient documentation

## 2018-01-20 DIAGNOSIS — Z7984 Long term (current) use of oral hypoglycemic drugs: Secondary | ICD-10-CM | POA: Insufficient documentation

## 2018-01-22 ENCOUNTER — Encounter (HOSPITAL_COMMUNITY)
Admission: RE | Admit: 2018-01-22 | Discharge: 2018-01-22 | Disposition: A | Discharge status: Home / Self Care | Payer: Medicare Other | Source: Ambulatory Visit | Attending: Interventional Cardiology | Admitting: Interventional Cardiology

## 2018-01-22 DIAGNOSIS — Z8546 Personal history of malignant neoplasm of prostate: Secondary | ICD-10-CM | POA: Diagnosis not present

## 2018-01-22 DIAGNOSIS — I2510 Atherosclerotic heart disease of native coronary artery without angina pectoris: Secondary | ICD-10-CM | POA: Diagnosis not present

## 2018-01-22 DIAGNOSIS — Z7984 Long term (current) use of oral hypoglycemic drugs: Secondary | ICD-10-CM | POA: Diagnosis not present

## 2018-01-22 DIAGNOSIS — Z7982 Long term (current) use of aspirin: Secondary | ICD-10-CM | POA: Diagnosis not present

## 2018-01-22 DIAGNOSIS — Z79899 Other long term (current) drug therapy: Secondary | ICD-10-CM | POA: Diagnosis not present

## 2018-01-22 DIAGNOSIS — Z951 Presence of aortocoronary bypass graft: Secondary | ICD-10-CM

## 2018-01-22 NOTE — Progress Notes (Signed)
Larry [name] 77 y.o. male DOB 1940-12-24 MRN [id number]       Nutrition  1. 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 Glimepiride, Metformin noted Lab Results  Component Value Date   HGBA1C 6.2 (H) 08/25/2017    Note Spoke with pt. Nutrition Plan and Nutrition Survey goals reviewed with pt. Pt is following a Heart Healthy diet.  Pt is diabetic. Last A1c indicates blood glucose well-controlled. This Probation officer went over Diabetes Education test results. Pt expressed understanding of the information reviewed. Pt aware of nutrition education classes offered and is unable to attend nutrition classes due to work.  Nutrition Diagnosis ? Food-and nutrition-related knowledge deficit related to lack of exposure to information as related to diagnosis of: ? CVD ? DM  ? Obesity related to excessive energy intake as evidenced by a BMI of 31.73  Nutrition Goal(s):  ? Pt to identify and limit food sources of saturated fat, trans fat, and sodium ? Pt to identify food quantities necessary to achieve weight loss of 6-24 lb (2.7-10.9 kg) at graduation from cardiac rehab. Goal wt of 200 lb desired.   Nutrition Intervention ? Pt's individual nutrition plan reviewed with pt. ? Benefits of adopting Heart Healthy diet discussed when Med[employer] reviewed.   ? Pt given handouts for: ? Nutrition I class ? Nutrition II class ? Diabetes Blitz Class Plan:  Pt to attend nutrition classes ? Portion Distortion - met 01/08/18 ? Diabetes Q & A - met 12/27/17 Will provide client-centered nutrition education as part of interdisciplinary care.   Monitor and evaluate progress toward nutrition goal with team.  Derek Mound, M.Ed, RD, LDN, CDE 01/22/2018 3:33 PM

## 2018-01-24 ENCOUNTER — Encounter (HOSPITAL_COMMUNITY)
Admission: RE | Admit: 2018-01-24 | Discharge: 2018-01-24 | Disposition: A | Discharge status: Home / Self Care | Payer: Medicare Other | Source: Ambulatory Visit | Attending: Interventional Cardiology | Admitting: Interventional Cardiology

## 2018-01-24 DIAGNOSIS — I2510 Atherosclerotic heart disease of native coronary artery without angina pectoris: Secondary | ICD-10-CM | POA: Diagnosis not present

## 2018-01-24 DIAGNOSIS — Z7982 Long term (current) use of aspirin: Secondary | ICD-10-CM | POA: Diagnosis not present

## 2018-01-24 DIAGNOSIS — Z951 Presence of aortocoronary bypass graft: Secondary | ICD-10-CM | POA: Diagnosis not present

## 2018-01-24 DIAGNOSIS — Z79899 Other long term (current) drug therapy: Secondary | ICD-10-CM | POA: Diagnosis not present

## 2018-01-24 DIAGNOSIS — Z7984 Long term (current) use of oral hypoglycemic drugs: Secondary | ICD-10-CM | POA: Diagnosis not present

## 2018-01-24 DIAGNOSIS — Z8546 Personal history of malignant neoplasm of prostate: Secondary | ICD-10-CM | POA: Diagnosis not present

## 2018-01-27 ENCOUNTER — Encounter (HOSPITAL_COMMUNITY)
Admission: RE | Admit: 2018-01-27 | Discharge: 2018-01-27 | Disposition: A | Discharge status: Home / Self Care | Payer: Medicare Other | Source: Ambulatory Visit | Attending: Interventional Cardiology | Admitting: Interventional Cardiology

## 2018-01-27 DIAGNOSIS — I2510 Atherosclerotic heart disease of native coronary artery without angina pectoris: Secondary | ICD-10-CM | POA: Diagnosis not present

## 2018-01-27 DIAGNOSIS — Z951 Presence of aortocoronary bypass graft: Secondary | ICD-10-CM

## 2018-01-27 DIAGNOSIS — Z79899 Other long term (current) drug therapy: Secondary | ICD-10-CM | POA: Diagnosis not present

## 2018-01-27 DIAGNOSIS — Z8546 Personal history of malignant neoplasm of prostate: Secondary | ICD-10-CM | POA: Diagnosis not present

## 2018-01-27 DIAGNOSIS — Z7984 Long term (current) use of oral hypoglycemic drugs: Secondary | ICD-10-CM | POA: Diagnosis not present

## 2018-01-27 DIAGNOSIS — Z7982 Long term (current) use of aspirin: Secondary | ICD-10-CM | POA: Diagnosis not present

## 2018-01-29 ENCOUNTER — Encounter (HOSPITAL_COMMUNITY)
Admission: RE | Admit: 2018-01-29 | Discharge: 2018-01-29 | Disposition: A | Discharge status: Home / Self Care | Payer: Medicare Other | Source: Ambulatory Visit | Attending: Interventional Cardiology | Admitting: Interventional Cardiology

## 2018-01-29 DIAGNOSIS — Z79899 Other long term (current) drug therapy: Secondary | ICD-10-CM | POA: Diagnosis not present

## 2018-01-29 DIAGNOSIS — Z8546 Personal history of malignant neoplasm of prostate: Secondary | ICD-10-CM | POA: Diagnosis not present

## 2018-01-29 DIAGNOSIS — Z951 Presence of aortocoronary bypass graft: Secondary | ICD-10-CM | POA: Diagnosis not present

## 2018-01-29 DIAGNOSIS — Z7982 Long term (current) use of aspirin: Secondary | ICD-10-CM | POA: Diagnosis not present

## 2018-01-29 DIAGNOSIS — Z7984 Long term (current) use of oral hypoglycemic drugs: Secondary | ICD-10-CM | POA: Diagnosis not present

## 2018-01-29 DIAGNOSIS — I2510 Atherosclerotic heart disease of native coronary artery without angina pectoris: Secondary | ICD-10-CM | POA: Diagnosis not present

## 2018-01-30 NOTE — Progress Notes (Signed)
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  ACCU-CHEK FASTCLIX LANCETS MISC, 1 each by Does not apply route 2 (two) times daily. Dx:E11.9, Disp: 100 each, Rfl: 11 .  aspirin EC 325 MG EC tablet, Take 1 tablet (325 mg total) daily by mouth., Disp: 30 tablet, Rfl: 0 .  Cholecalciferol (VITAMIN D3) 1000 UNITS CAPS, Take 1,000 Units by mouth daily. , Disp: , Rfl:  .  furosemide (LASIX) 40 MG tablet, Take 40 mg by mouth daily., Disp: , Rfl:  .  glimepiride (AMARYL) 2 MG tablet, Take 1 tablet (2 mg total) by mouth 2 (two) times daily., Disp: 180 tablet, Rfl: 3 .  glucose blood (ACCU-CHEK SMARTVIEW) test strip, 1 each by Other route 2 (two) times daily. Use to check blood sugars twice a day Dx E11.9, Disp: 100 each, Rfl: 3 .  glucose blood test strip, 1 each by Other route 2 (two) times daily. Please dispense brand of choice per pt and insurance preference. Dx:250.00, Disp: 100 each, Rfl: 11 .  glucose monitoring kit (FREESTYLE) monitoring kit, 1 each by Does not apply route as needed for other. Please dispense brand of choice per ins/patient. Dx: 250.00., Disp: 1 each, Rfl: 0 .  losartan (COZAAR) 100 MG tablet, Take 100 mg by mouth daily., Disp: , Rfl:  .  metFORMIN (GLUCOPHAGE) 1000 MG tablet, TAKE 1 TABLET BY MOUTH TWICE DAILY WITH MEALS (Patient taking differently: TAKE 1 TABLET ('1000mg'$ ) BY MOUTH TWICE DAILY WITH MEALS), Disp: 180 tablet,  Rfl: 3 .  metoprolol tartrate (LOPRESSOR) 50 MG tablet, Take 1 tablet (50 mg total) by mouth 2 (two) times daily., Disp: 180 tablet, Rfl: 3 .  Multiple Vitamins-Minerals (MACULAR VITAMIN BENEFIT) TABS, Take 1 tablet by mouth 2 (two) times daily., Disp: , Rfl:  .  nitroGLYCERIN (NITROSTAT) 0.4 MG SL tablet, Place 1 tablet (0.4 mg total) under the tongue every 5 (five) minutes as needed for chest pain., Disp: 20 tablet, Rfl: 3 .  potassium chloride (K-DUR,KLOR-CON) 10 MEQ tablet, TAKE 1 TABLET BY MOUTH ONCE DAILY, Disp: 90 tablet, Rfl: 3 .  simvastatin (ZOCOR) 40 MG tablet, TAKE 1 TABLET BY MOUTH AT BEDTIME (Patient taking differently: TAKE 1 TABLET ('40mg'$ ) BY MOUTH AT BEDTIME), Disp: 90 tablet, Rfl: 3 .  spironolactone (ALDACTONE) 25 MG tablet, TAKE 1/2 (ONE-HALF) TABLET BY MOUTH ONCE DAILY, Disp: 45 tablet, Rfl: 3  Past Medical History: Past Medical History:  Diagnosis Date  . Carotid stenosis, left   . Cataracts, bilateral    removed  . Coronary artery disease   . Hyperlipemia   . Prostate CA ([place])   . Sleep apnea    Dr Ledora Bottcher ; uses CPAP  . Type II or unspecified type diabetes mellitus without mention of complication, not stated as uncontrolled   . Unspecified essential hypertension     Tobacco Use: Social History   Tobacco Use  Smoking Status Never Smoker  Smokeless  Tobacco Never Used    Labs: Recent Review Flowsheet Data    Labs for ITP Cardiac and Pulmonary Rehab Latest Ref Rng & Units 06/28/2017 06/28/2017 06/30/2017 08/06/2017 08/25/2017   Cholestrol 100 - 199 mg/dL - - - 128 -   LDLCALC 0 - 99 mg/dL - - - 66 -   HDL >39 mg/dL - - - 39(L) -   Trlycerides 0 - 149 mg/dL - - - 117 -   Hemoglobin A1c 4.8 - 5.6 % - - - - 6.2(H)   PHART 7.350 - 7.450 7.396 - - - -   PCO2ART 32.0 - 48.0 mmHg 39.9 - - - -   HCO3 20.0 - 28.0 mmol/L 24.3 - - - -   TCO2 22 - 32 mmol/L 26 24 - - -   ACIDBASEDEF 0.0 - 2.0 mmol/L - - - - -   O2SAT % 94.0 - 57.2 - -      Capillary Blood  Glucose: Lab Results  Component Value Date   GLUCAP 122 (H) 12/20/2017   GLUCAP 140 (H) 12/20/2017   GLUCAP 101 (H) 12/18/2017   GLUCAP 157 (H) 12/18/2017   GLUCAP 103 (H) 12/16/2017     Exercise Target Goals:    Exercise Program Goal: Individual exercise prescription set using results from initial 6 min walk test and THRR while considering  patient's activity barriers and safety.   Exercise Prescription Goal: Initial exercise prescription builds to 30-45 minutes a day of aerobic activity, 2-3 days per week.  Home exercise guidelines will be given to patient during program as part of exercise prescription that the participant will acknowledge.  Activity Barriers & Risk Stratification: Activity Barriers & Cardiac Risk Stratification - 12/10/17 1120      Activity Barriers & Cardiac Risk Stratification   Activity Barriers  None    Cardiac Risk Stratification  High       6 Minute Walk: 6 Minute Walk    Row Name 12/10/17 1116         6 Minute Walk   Phase  Initial     Distance  1482 feet     Walk Time  6 minutes     # of Rest Breaks  0     MPH  2.81     METS  2.74     RPE  12     VO2 Peak  9.6     Symptoms  No     Resting HR  65 bpm     Resting BP  130/70     Resting Oxygen Saturation   99 %     Exercise Oxygen Saturation  during 6 min walk  95 %     Max Ex. HR  89 bpm     Max Ex. BP  156/68     2 Minute Post BP  122/60        Oxygen Initial Assessment:   Oxygen Re-Evaluation:   Oxygen Discharge (Final Oxygen Re-Evaluation):   Initial Exercise Prescription: Initial Exercise Prescription - 12/10/17 1100      Date of Initial Exercise RX and Referring Provider   Date  12/10/17    Referring Provider  Daneen Schick, MD      Treadmill   MPH  2.1    Grade  0.5    Minutes  10    METs  2.75      Bike   Level  0.7    Minutes  10    METs  2.26      NuStep   Level  3    SPM  85    Minutes  10    METs  2.5      Prescription Details   Frequency  (times per week)  3    Duration  Progress to 30 minutes of continuous aerobic without signs/symptoms of physical distress      Intensity   THRR 40-80% of Max Heartrate  58-115    Ratings of Perceived Exertion  11-13    Perceived Dyspnea  0-4      Progression   Progression  Continue to progress workloads to maintain intensity without signs/symptoms of physical distress.      Resistance Training   Training Prescription  Yes    Weight  3lbs    Reps  10-15       Perform Capillary Blood Glucose checks as needed.  Exercise Prescription Changes: Exercise Prescription Changes    Row Name 12/23/17 1451 01/06/18 1451 01/20/18 1448         Response to Exercise   Blood Pressure (Admit)  124/52  112/62  120/80     Blood Pressure (Exercise)  144/68  128/74  138/82     Blood Pressure (Exit)  112/62  108/72  102/60     Heart Rate (Admit)  72 bpm  75 bpm  71 bpm     Heart Rate (Exercise)  103 bpm  112 bpm  109 bpm     Heart Rate (Exit)  75 bpm  75 bpm  79 bpm     Rating of Perceived Exertion (Exercise)  '12  12  12     '$ Symptoms  none  none  none     Duration  Progress to 30 minutes of  aerobic without signs/symptoms of physical distress  Progress to 30 minutes of  aerobic without signs/symptoms of physical distress  Progress to 30 minutes of  aerobic without signs/symptoms of physical distress     Intensity  THRR unchanged  THRR unchanged  THRR unchanged       Progression   Progression  Continue to progress workloads to maintain intensity without signs/symptoms of physical distress.  Continue to progress workloads to maintain intensity without signs/symptoms of physical distress.  Continue to progress workloads to maintain intensity without signs/symptoms of physical distress.     Average METs  2.6  2.9  2.9       Resistance Training   Training Prescription  Yes  Yes  Yes     Weight  3lbs  5lbs  5lbs     Reps  10-15  10-15  10-15     Time  10 Minutes  10 Minutes  10 Minutes        Interval Training   Interval Training  No  No  No       Treadmill   MPH  2.1  2.7  2.7     Grade  0.'5  1  1     '$ Minutes  '10  10  10     '$ METs  2.9  3.44  3.44       Bike   Level  0.'9  1  1     '$ Minutes  '10  10  10     '$ METs  2.62  2.77  2.77       NuStep   Level  '3  4  4     '$ SPM  85  85  85  Minutes  '10  10  10     '$ METs  2.4  2.6  2.6       Home Exercise Plan   Plans to continue exercise at  -  Home (comment)  Home (comment)     Frequency  -  Add 1 additional day to program exercise sessions.  Add 1 additional day to program exercise sessions.     Initial Home Exercises Provided  -  12/27/17  12/27/17        Exercise Comments: Exercise Comments    Row Name 12/25/17 1500 12/27/17 1508 01/06/18 1451 01/20/18 1520     Exercise Comments  Reviewed METs and goals with patient.  Reviewed home exercise guidelines with patient.  Reviewed METs with patient.  Reviewed METs and goals with patient.       Exercise Goals and Review: Exercise Goals    Row Name 12/10/17 1123             Exercise Goals   Increase Physical Activity  Yes       Intervention  Provide advice, education, support and counseling about physical activity/exercise needs.;Develop an individualized exercise prescription for aerobic and resistive training based on initial evaluation findings, risk stratification, comorbidities and participant's personal goals.       Expected Outcomes  Long Term: Exercising regularly at least 3-5 days a week.;Short Term: Attend rehab on a regular basis to increase amount of physical activity.;Long Term: Add in home exercise to make exercise part of routine and to increase amount of physical activity.       Increase Strength and Stamina  Yes       Intervention  Provide advice, education, support and counseling about physical activity/exercise needs.;Develop an individualized exercise prescription for aerobic and resistive training based on initial evaluation findings, risk  stratification, comorbidities and participant's personal goals.       Expected Outcomes  Short Term: Increase workloads from initial exercise prescription for resistance, speed, and METs.;Long Term: Improve cardiorespiratory fitness, muscular endurance and strength as measured by increased METs and functional capacity (6MWT)       Able to understand and use rate of perceived exertion (RPE) scale  Yes       Intervention  Provide education and explanation on how to use RPE scale       Expected Outcomes  Short Term: Able to use RPE daily in rehab to express subjective intensity level;Long Term:  Able to use RPE to guide intensity level when exercising independently       Knowledge and understanding of Target Heart Rate Range (THRR)  Yes       Intervention  Provide education and explanation of THRR including how the numbers were predicted and where they are located for reference       Expected Outcomes  Short Term: Able to state/look up THRR;Long Term: Able to use THRR to govern intensity when exercising independently;Short Term: Able to use daily as guideline for intensity in rehab       Able to check pulse independently  Yes       Intervention  Review the importance of being able to check your own pulse for safety during independent exercise;Provide education and demonstration on how to check pulse in carotid and radial arteries.       Expected Outcomes  Short Term: Able to explain why pulse checking is important during independent exercise;Long Term: Able to check pulse independently and accurately       Understanding of  Exercise Prescription  Yes       Intervention  Provide education, explanation, and written materials on patient's individual exercise prescription       Expected Outcomes  Short Term: Able to explain program exercise prescription;Long Term: Able to explain home exercise prescription to exercise independently          Exercise Goals Re-Evaluation : Exercise Goals Re-Evaluation     Row Name 12/25/17 1500 12/27/17 1508 01/20/18 1448         Exercise Goal Re-Evaluation   Exercise Goals Review  Increase Physical Activity  Increase Physical Activity  Increase Physical Activity     Comments  Patient is not currently exercisng on the days he doesn't attend cardiac rehab. Will review home exercise guidelines with patient. Pt understands and is able to use the RPE scale appropriately.  Reviewed home exercise guidelines with patient including THRR, RPE scale, and endpoints for exercise.   Patient isn't walking at the moment for exercise but does play golf 2 days/week. Encouraged patient to try and add at least 1 additional day of exercise in addition to exercise at [facility] to help achieve personal health and fitness goals.     Expected Outcomes  Increase workloads as tolerated.  Patient will add 1-2 days walking for 30 minutes to help increase strength and endurance.  Increase workloads to help increase MET level.         Discharge Exercise Prescription (Final Exercise Prescription Changes): Exercise Prescription Changes - 01/20/18 1448      Response to Exercise   Blood Pressure (Admit)  120/80    Blood Pressure (Exercise)  138/82    Blood Pressure (Exit)  102/60    Heart Rate (Admit)  71 bpm    Heart Rate (Exercise)  109 bpm    Heart Rate (Exit)  79 bpm    Rating of Perceived Exertion (Exercise)  12    Symptoms  none    Duration  Progress to 30 minutes of  aerobic without signs/symptoms of physical distress    Intensity  THRR unchanged      Progression   Progression  Continue to progress workloads to maintain intensity without signs/symptoms of physical distress.    Average METs  2.9      Resistance Training   Training Prescription  Yes    Weight  5lbs    Reps  10-15    Time  10 Minutes      Interval Training   Interval Training  No      Treadmill   MPH  2.7    Grade  1    Minutes  10    METs  3.44      Bike   Level  1    Minutes  10    METs  2.77       NuStep   Level  4    SPM  85    Minutes  10    METs  2.6      Home Exercise Plan   Plans to continue exercise at  Home (comment)    Frequency  Add 1 additional day to program exercise sessions.    Initial Home Exercises Provided  12/27/17       Nutrition:  Target Goals: Understanding of nutrition guidelines, daily intake of sodium '1500mg'$ , cholesterol '200mg'$ , calories 30% from fat and 7% or less from saturated fats, daily to have 5 or more servings of fruits and vegetables.  Biometrics: Pre Biometrics - 12/10/17  1124      Pre Biometrics   Height  6' (1.829 m)    Weight  234 lb 5.6 oz (106.3 kg)    Waist Circumference  43.5 inches    Hip Circumference  44.5 inches    Waist to Hip Ratio  0.98 %    BMI (Calculated)  31.78    Triceps Skinfold  28 mm    % Body Fat  33.1 %    Grip Strength  39.5 kg    Flexibility  11 in    Single Leg Stand  6.76 seconds        Nutrition Therapy Plan and Nutrition Goals: Nutrition Therapy & Goals - 12/16/17 0933      Nutrition Therapy   Diet  Consistent Carb, Heart Healthy      Personal Nutrition Goals   Nutrition Goal  Pt to identify and limit food sources of saturated fat, trans fat, and sodium    Personal Goal #2  Pt to identify food quantities necessary to achieve weight loss of 6-24 lb (2.7-10.9 kg) at graduation from cardiac rehab. Goal wt of 200 lb desired.       Intervention Plan   Intervention  Prescribe, educate and counsel regarding individualized specific dietary modifications aiming towards targeted core components such as weight, hypertension, lipid management, diabetes, heart failure and other comorbidities.    Expected Outcomes  Short Term Goal: Understand basic principles of dietary content, such as calories, fat, sodium, cholesterol and nutrients.;Long Term Goal: Adherence to prescribed nutrition plan.       Nutrition Assessments: Nutrition Assessments - 12/16/17 0933      MEDFICTS Scores   Pre Score  --   (Significant)  returned incomplete       Nutrition Goals Re-Evaluation:   Nutrition Goals Re-Evaluation:   Nutrition Goals Discharge (Final Nutrition Goals Re-Evaluation):   Psychosocial: Target Goals: Acknowledge presence or absence of significant depression and/or stress, maximize coping skills, provide positive support system. Participant is able to verbalize types and ability to use techniques and skills needed for reducing stress and depression.  Initial Review & Psychosocial Screening: Initial Psych Review & Screening - 12/10/17 0844      Initial Review   Current issues with  None Identified      Family Dynamics   Good Support System?  Yes spouse, family      Barriers   Psychosocial barriers to participate in program  There are no identifiable barriers or psychosocial needs.      Screening Interventions   Interventions  Encouraged to exercise       Quality of Life Scores: Quality of Life - 12/10/17 1130      Quality of Life Scores   Health/Function Pre  26.47 %    Socioeconomic Pre  24.86 %    Psych/Spiritual Pre  28.57 %    Family Pre  26.4 %    GLOBAL Pre  26.56 %      Scores of 19 and below usually indicate a poorer quality of life in these areas.  A difference of  2-3 points is a clinically meaningful difference.  A difference of 2-3 points in the total score of the Quality of Life Index has been associated with significant improvement in overall quality of life, self-image, physical symptoms, and general health in studies assessing change in quality of life.  PHQ-9: Recent Review Flowsheet Data    Depression screen [facility] 2/9 12/16/2017 08/29/2017 01/09/2016   Decreased Interest 0 0 1  Down, Depressed, Hopeless 0 0 0   PHQ - 2 Score 0 0 1     Interpretation of Total Score  Total Score Depression Severity:  1-4 = Minimal depression, 5-9 = Mild depression, 10-14 = Moderate depression, 15-19 = Moderately severe depression, 20-27 = Severe depression    Psychosocial Evaluation and Intervention:   Psychosocial Re-Evaluation: Psychosocial Re-Evaluation    [place] Name 01/02/18 1434             Psychosocial Re-Evaluation   Current issues with  None Identified       Interventions  Encouraged to attend Cardiac Rehabilitation for the exercise       Continue Psychosocial Services   No Follow up required          Psychosocial Discharge (Final Psychosocial Re-Evaluation): Psychosocial Re-Evaluation - 01/02/18 1434      Psychosocial Re-Evaluation   Current issues with  None Identified    Interventions  Encouraged to attend Cardiac Rehabilitation for the exercise    Continue Psychosocial Services   No Follow up required       Vocational Rehabilitation: Provide vocational rehab assistance to qualifying candidates.   Vocational Rehab Evaluation & Intervention: Vocational Rehab - 12/10/17 0844      Initial Vocational Rehab Evaluation & Intervention   Assessment shows need for Vocational Rehabilitation  No       Education: Education Goals: Education classes will be provided on a weekly basis, covering required topics. Participant will state understanding/return demonstration of topics presented.  Learning Barriers/Preferences: Learning Barriers/Preferences - 12/09/17 1518      Learning Barriers/Preferences   Learning Barriers  None    Learning Preferences  Skilled Demonstration       Education Topics: Count Your Pulse:  -Group instruction provided by verbal instruction, demonstration, patient participation and written materials to support subject.  Instructors address importance of being able to find your pulse and how to count your pulse when at home without a heart monitor.  Patients get hands on experience counting their pulse with staff help and individually.   CARDIAC REHAB PHASE II EXERCISE from 01/29/2018 in [place]  Date  01/03/18  Educator  [name]  Instruction Review Code   2- Demonstrated Understanding      Heart Attack, Angina, and Risk Factor Modification:  -Group instruction provided by verbal instruction, video, and written materials to support subject.  Instructors address signs and symptoms of angina and heart attacks.    Also discuss risk factors for heart disease and how to make changes to improve heart health risk factors.   Functional Fitness:  -Group instruction provided by verbal instruction, demonstration, patient participation, and written materials to support subject.  Instructors address safety measures for doing things around the house.  Discuss how to get up and down off the floor, how to pick things up properly, how to safely get out of a chair without assistance, and balance training.   CARDIAC REHAB PHASE II EXERCISE from 01/29/2018 in [place]  Date  01/10/18  Educator  [place]  Instruction Review Code  2- Demonstrated Understanding      Meditation and Mindfulness:  -Group instruction provided by verbal instruction, patient participation, and written materials to support subject.  Instructor addresses importance of mindfulness and meditation practice to help reduce stress and improve awareness.  Instructor also leads participants through a meditation exercise.    Stretching for Flexibility and Mobility:  -Group instruction provided by  verbal instruction, patient participation, and written materials to support subject.  Instructors lead participants through series of stretches that are designed to increase flexibility thus improving mobility.  These stretches are additional exercise for major muscle groups that are typically performed during regular warm up and cool down.   Hands Only CPR:  -Group verbal, video, and participation provides a basic overview of AHA guidelines for community CPR. Role-play of emergencies allow participants the opportunity to practice calling for help and chest compression  technique with discussion of AED use.   Hypertension: -Group verbal and written instruction that provides a basic overview of hypertension including the most recent diagnostic guidelines, risk factor reduction with self-care instructions and medication management.   CARDIAC REHAB PHASE II EXERCISE from 01/29/2018 in [place]  Date  12/20/17  Instruction Review Code  2- Demonstrated Understanding       Nutrition I class: Heart Healthy Eating:  -Group instruction provided by PowerPoint slides, verbal discussion, and written materials to support subject matter. The instructor gives an explanation and review of the Therapeutic Lifestyle Changes diet recommendations, which includes a discussion on lipid goals, dietary fat, sodium, fiber, plant stanol/sterol esters, sugar, and the components of a well-balanced, healthy diet.   CARDIAC REHAB PHASE II EXERCISE from 01/29/2018 in [place]  Date  01/24/18  Educator  RD      Nutrition II class: Lifestyle Skills:  -Group instruction provided by PowerPoint slides, verbal discussion, and written materials to support subject matter. The instructor gives an explanation and review of label reading, grocery shopping for heart health, heart healthy recipe modifications, and ways to make healthier choices when eating out.   CARDIAC REHAB PHASE II EXERCISE from 01/29/2018 in [place]  Date  01/24/18  Educator  RD      Diabetes Question & Answer:  -Group instruction provided by PowerPoint slides, verbal discussion, and written materials to support subject matter. The instructor gives an explanation and review of diabetes co-morbidities, pre- and post-prandial blood glucose goals, pre-exercise blood glucose goals, signs, symptoms, and treatment of hypoglycemia and hyperglycemia, and foot care basics.   CARDIAC REHAB PHASE II EXERCISE from 01/29/2018 in [place]  Date  12/27/17  Educator  RD  Instruction Review Code  2- Demonstrated Understanding      Diabetes Blitz:  -Group instruction provided by PowerPoint slides, verbal discussion, and written materials to support subject matter. The instructor gives an explanation and review of the physiology behind type 1 and type 2 diabetes, diabetes medications and rational behind using different medications, pre- and post-prandial blood glucose recommendations and Hemoglobin A1c goals, diabetes diet, and exercise including blood glucose guidelines for exercising safely.    CARDIAC REHAB PHASE II EXERCISE from 01/29/2018 in [place]  Date  01/24/18  Educator  RD      Portion Distortion:  -Group instruction provided by PowerPoint slides, verbal discussion, written materials, and food models to support subject matter. The instructor gives an explanation of serving size versus portion size, changes in portions sizes over the last 20 years, and what consists of a serving from each food group.   CARDIAC REHAB PHASE II EXERCISE from 01/29/2018 in [place]  Date  01/08/18  Educator  RD  Instruction Review Code  2- Demonstrated Understanding      Stress Management:  -Group instruction provided by verbal  instruction, video, and written materials to support subject matter.  Instructors review role of stress in heart disease and how to cope with stress positively.     CARDIAC REHAB PHASE II EXERCISE from 01/29/2018 in [place]  Date  01/15/18  Educator  RN  Instruction Review Code  2- Demonstrated Understanding      Exercising on Your Own:  -Group instruction provided by verbal instruction, power point, and written materials to support subject.  Instructors discuss benefits of exercise, components of exercise, frequency and intensity of exercise, and end points for exercise.  Also  discuss use of nitroglycerin and activating EMS.  Review options of places to exercise outside of rehab.  Review guidelines for sex with heart disease.   CARDIAC REHAB PHASE II EXERCISE from 01/29/2018 in [place]  Date  12/18/17  Educator  EP  Instruction Review Code  2- Demonstrated Understanding      Cardiac Drugs I:  -Group instruction provided by verbal instruction and written materials to support subject.  Instructor reviews cardiac drug classes: antiplatelets, anticoagulants, beta blockers, and statins.  Instructor discusses reasons, side effects, and lifestyle considerations for each drug class.   CARDIAC REHAB PHASE II EXERCISE from 01/29/2018 in [place]  Date  12/25/17  Educator  -- [Pharmacy]  Instruction Review Code  2- Demonstrated Understanding      Cardiac Drugs II:  -Group instruction provided by verbal instruction and written materials to support subject.  Instructor reviews cardiac drug classes: angiotensin converting enzyme inhibitors (ACE-I), angiotensin II receptor blockers (ARBs), nitrates, and calcium channel blockers.  Instructor discusses reasons, side effects, and lifestyle considerations for each drug class.   CARDIAC REHAB PHASE II EXERCISE from 01/29/2018 in [place]  Date  01/29/18  Educator  Pharmacist  Instruction Review Code  2- Demonstrated Understanding      Anatomy and Physiology of the Circulatory System:  Group verbal and written instruction and models provide basic cardiac anatomy and physiology, with the coronary electrical and arterial systems. Review of: AMI, Angina, Valve disease, Heart Failure, Peripheral Artery Disease, Cardiac Arrhythmia, Pacemakers, and the ICD.   CARDIAC REHAB PHASE II EXERCISE from 01/29/2018 in [place]  Date  01/22/18  Instruction Review Code  2- Demonstrated Understanding      Other  Education:  -Group or individual verbal, written, or video instructions that support the educational goals of the cardiac rehab program.   Holiday Eating Survival Tips:  -Group instruction provided by PowerPoint slides, verbal discussion, and written materials to support subject matter. The instructor gives patients tips, tricks, and techniques to help them not only survive but enjoy the holidays despite the onslaught of food that accompanies the holidays.   Knowledge Questionnaire Score: Knowledge Questionnaire Score - 12/10/17 0844      Knowledge Questionnaire Score   Pre Score  21/24       Core Components/Risk Factors/Patient Goals at Admission: Personal Goals and Risk Factors at Admission - 12/10/17 1114      Core Components/Risk Factors/Patient Goals on Admission    Weight Management  Yes;Obesity    Intervention  Weight Management: Develop a combined nutrition and exercise program designed to reach desired caloric intake, while maintaining appropriate intake of nutrient and fiber, sodium and fats, and appropriate energy expenditure required for the weight goal.;Weight Management: Provide education and appropriate resources to help participant work on and attain  dietary goals.;Weight Management/Obesity: Establish reasonable short term and long term weight goals.;Obesity: Provide education and appropriate resources to help participant work on and attain dietary goals.    Admit Weight  234 lb 5.6 oz (106.3 kg)    Goal Weight: Short Term  228 lb (103.4 kg)    Goal Weight: Long Term  200 lb (90.7 kg)    Expected Outcomes  Short Term: Continue to assess and modify interventions until short term weight is achieved;Long Term: Adherence to nutrition and physical activity/exercise program aimed toward attainment of established weight goal;Weight Loss: Understanding of general recommendations for a balanced deficit meal plan, which promotes 1-2 lb weight loss per week and includes a negative  energy balance of [id number] kcal/d;Understanding recommendations for meals to include 15-35% energy as protein, 25-35% energy from fat, 35-60% energy from carbohydrates, less than '200mg'$  of dietary cholesterol, 20-35 gm of total fiber daily;Understanding of distribution of calorie intake throughout the day with the consumption of 4-5 meals/snacks    Diabetes  Yes    Intervention  Provide education about signs/symptoms and action to take for hypo/hyperglycemia.;Provide education about proper nutrition, including hydration, and aerobic/resistive exercise prescription along with prescribed medications to achieve blood glucose in normal ranges: Fasting glucose 65-99 mg/dL    Expected Outcomes  Short Term: Participant verbalizes understanding of the signs/symptoms and immediate care of hyper/hypoglycemia, proper foot care and importance of medication, aerobic/resistive exercise and nutrition plan for blood glucose control.;Long Term: Attainment of HbA1C < 7%.    Hypertension  Yes    Intervention  Provide education on lifestyle modifcations including regular physical activity/exercise, weight management, moderate sodium restriction and increased consumption of fresh fruit, vegetables, and low fat dairy, alcohol moderation, and smoking cessation.;Monitor prescription use compliance.    Expected Outcomes  Short Term: Continued assessment and intervention until BP is < 140/39m HG in hypertensive participants. < 130/887mHG in hypertensive participants with diabetes, heart failure or chronic kidney disease.;Long Term: Maintenance of blood pressure at goal levels.    Lipids  Yes    Intervention  Provide education and support for participant on nutrition & aerobic/resistive exercise along with prescribed medications to achieve LDL '70mg'$ , HDL >'40mg'$ .    Expected Outcomes  Short Term: Participant states understanding of desired cholesterol values and is compliant with medications prescribed. Participant is following  exercise prescription and nutrition guidelines.;Long Term: Cholesterol controlled with medications as prescribed, with individualized exercise RX and with personalized nutrition plan. Value goals: LDL < '70mg'$ , HDL > 40 mg.       Core Components/Risk Factors/Patient Goals Review:  Goals and Risk Factor Review    Row Name 01/02/18 1431 01/30/18 1559           Core Components/Risk Factors/Patient Goals Review   Personal Goals Review  Weight Management/Obesity;Lipids;Diabetes;Hypertension  Weight Management/Obesity;Lipids;Diabetes;Hypertension      Review  Larry [name]'s vital signs and CBG's have been stable at phase 2 cardiac rehab.  Larry [name]'s vital signs and CBG's have been stable at phase 2 cardiac rehab.      Expected Outcomes  BoMikki Santeeill continue to take his medicaitons as presribed and come to cardiac rehab to exercise.  BoMikki Santeeill continue to take his medicaitons as presribed and participate in phase 2 cardiac rehab.         Core Components/Risk Factors/Patient Goals at Discharge (Final Review):  Goals and Risk Factor Review - 01/30/18 1559      Core Components/Risk Factors/Patient Goals Review   Personal Goals Review  Weight Management/Obesity;Lipids;Diabetes;Hypertension  Review  Larry [name]'s vital signs and CBG's have been stable at phase 2 cardiac rehab.    Expected Outcomes  Larry Santee will continue to take his medicaitons as presribed and participate in phase 2 cardiac rehab.       ITP Comments: ITP Comments    Row Name 12/10/17 0843 01/02/18 1425 01/30/18 1559       ITP Comments  Dr. Fransico Him, Medical Director   30 Day ITP comment. Patient with good attendance and partcipation in phase 2 cardiac rehab  30 Day ITP comment. Patient with good attendance and partcipation in phase 2 cardiac rehab         Comments: See ITP comments.[name], RN,BSN 01/30/2018 4:03 PM

## 2018-01-31 ENCOUNTER — Encounter (HOSPITAL_COMMUNITY)
Admission: RE | Admit: 2018-01-31 | Discharge: 2018-01-31 | Disposition: A | Discharge status: Home / Self Care | Payer: Medicare Other | Source: Ambulatory Visit | Attending: Interventional Cardiology | Admitting: Interventional Cardiology

## 2018-01-31 DIAGNOSIS — Z951 Presence of aortocoronary bypass graft: Secondary | ICD-10-CM | POA: Diagnosis not present

## 2018-01-31 DIAGNOSIS — Z7982 Long term (current) use of aspirin: Secondary | ICD-10-CM | POA: Diagnosis not present

## 2018-01-31 DIAGNOSIS — Z79899 Other long term (current) drug therapy: Secondary | ICD-10-CM | POA: Diagnosis not present

## 2018-01-31 DIAGNOSIS — Z7984 Long term (current) use of oral hypoglycemic drugs: Secondary | ICD-10-CM | POA: Diagnosis not present

## 2018-01-31 DIAGNOSIS — Z8546 Personal history of malignant neoplasm of prostate: Secondary | ICD-10-CM | POA: Diagnosis not present

## 2018-01-31 DIAGNOSIS — I2510 Atherosclerotic heart disease of native coronary artery without angina pectoris: Secondary | ICD-10-CM | POA: Diagnosis not present

## 2018-02-03 ENCOUNTER — Encounter (HOSPITAL_COMMUNITY)
Admission: RE | Admit: 2018-02-03 | Discharge: 2018-02-03 | Disposition: A | Discharge status: Home / Self Care | Payer: Medicare Other | Source: Ambulatory Visit | Attending: Interventional Cardiology | Admitting: Interventional Cardiology

## 2018-02-03 DIAGNOSIS — Z951 Presence of aortocoronary bypass graft: Secondary | ICD-10-CM

## 2018-02-03 DIAGNOSIS — I2510 Atherosclerotic heart disease of native coronary artery without angina pectoris: Secondary | ICD-10-CM | POA: Diagnosis not present

## 2018-02-03 DIAGNOSIS — Z7984 Long term (current) use of oral hypoglycemic drugs: Secondary | ICD-10-CM | POA: Diagnosis not present

## 2018-02-03 DIAGNOSIS — Z7982 Long term (current) use of aspirin: Secondary | ICD-10-CM | POA: Diagnosis not present

## 2018-02-03 DIAGNOSIS — Z8546 Personal history of malignant neoplasm of prostate: Secondary | ICD-10-CM | POA: Diagnosis not present

## 2018-02-03 DIAGNOSIS — Z79899 Other long term (current) drug therapy: Secondary | ICD-10-CM | POA: Diagnosis not present

## 2018-02-05 ENCOUNTER — Encounter (HOSPITAL_COMMUNITY)
Admission: RE | Admit: 2018-02-05 | Discharge: 2018-02-05 | Disposition: A | Discharge status: Home / Self Care | Payer: Medicare Other | Source: Ambulatory Visit | Attending: Interventional Cardiology | Admitting: Interventional Cardiology

## 2018-02-05 DIAGNOSIS — Z7984 Long term (current) use of oral hypoglycemic drugs: Secondary | ICD-10-CM | POA: Diagnosis not present

## 2018-02-05 DIAGNOSIS — Z951 Presence of aortocoronary bypass graft: Secondary | ICD-10-CM

## 2018-02-05 DIAGNOSIS — I2510 Atherosclerotic heart disease of native coronary artery without angina pectoris: Secondary | ICD-10-CM | POA: Diagnosis not present

## 2018-02-05 DIAGNOSIS — Z8546 Personal history of malignant neoplasm of prostate: Secondary | ICD-10-CM | POA: Diagnosis not present

## 2018-02-05 DIAGNOSIS — Z79899 Other long term (current) drug therapy: Secondary | ICD-10-CM | POA: Diagnosis not present

## 2018-02-05 DIAGNOSIS — Z7982 Long term (current) use of aspirin: Secondary | ICD-10-CM | POA: Diagnosis not present

## 2018-02-07 ENCOUNTER — Encounter (HOSPITAL_COMMUNITY)
Admission: RE | Admit: 2018-02-07 | Discharge: 2018-02-07 | Disposition: A | Discharge status: Home / Self Care | Payer: Medicare Other | Source: Ambulatory Visit | Attending: Interventional Cardiology | Admitting: Interventional Cardiology

## 2018-02-07 DIAGNOSIS — Z951 Presence of aortocoronary bypass graft: Secondary | ICD-10-CM | POA: Diagnosis not present

## 2018-02-07 DIAGNOSIS — Z7982 Long term (current) use of aspirin: Secondary | ICD-10-CM | POA: Diagnosis not present

## 2018-02-07 DIAGNOSIS — Z8546 Personal history of malignant neoplasm of prostate: Secondary | ICD-10-CM | POA: Diagnosis not present

## 2018-02-07 DIAGNOSIS — I2510 Atherosclerotic heart disease of native coronary artery without angina pectoris: Secondary | ICD-10-CM | POA: Diagnosis not present

## 2018-02-07 DIAGNOSIS — Z7984 Long term (current) use of oral hypoglycemic drugs: Secondary | ICD-10-CM | POA: Diagnosis not present

## 2018-02-07 DIAGNOSIS — Z79899 Other long term (current) drug therapy: Secondary | ICD-10-CM | POA: Diagnosis not present

## 2018-02-10 ENCOUNTER — Encounter (HOSPITAL_COMMUNITY)
Admission: RE | Admit: 2018-02-10 | Discharge: 2018-02-10 | Disposition: A | Discharge status: Home / Self Care | Payer: Medicare Other | Source: Ambulatory Visit | Attending: Interventional Cardiology | Admitting: Interventional Cardiology

## 2018-02-10 DIAGNOSIS — I2510 Atherosclerotic heart disease of native coronary artery without angina pectoris: Secondary | ICD-10-CM | POA: Diagnosis not present

## 2018-02-10 DIAGNOSIS — H353132 Nonexudative age-related macular degeneration, bilateral, intermediate dry stage: Secondary | ICD-10-CM | POA: Diagnosis not present

## 2018-02-10 DIAGNOSIS — E119 Type 2 diabetes mellitus without complications: Secondary | ICD-10-CM | POA: Diagnosis not present

## 2018-02-10 DIAGNOSIS — Z7984 Long term (current) use of oral hypoglycemic drugs: Secondary | ICD-10-CM | POA: Diagnosis not present

## 2018-02-10 DIAGNOSIS — Z79899 Other long term (current) drug therapy: Secondary | ICD-10-CM | POA: Diagnosis not present

## 2018-02-10 DIAGNOSIS — H35373 Puckering of macula, bilateral: Secondary | ICD-10-CM | POA: Diagnosis not present

## 2018-02-10 DIAGNOSIS — Z951 Presence of aortocoronary bypass graft: Secondary | ICD-10-CM

## 2018-02-10 DIAGNOSIS — Z8546 Personal history of malignant neoplasm of prostate: Secondary | ICD-10-CM | POA: Diagnosis not present

## 2018-02-10 DIAGNOSIS — Z7982 Long term (current) use of aspirin: Secondary | ICD-10-CM | POA: Diagnosis not present

## 2018-02-10 DIAGNOSIS — H40013 Open angle with borderline findings, low risk, bilateral: Secondary | ICD-10-CM | POA: Diagnosis not present

## 2018-02-12 ENCOUNTER — Encounter (HOSPITAL_COMMUNITY)
Admission: RE | Admit: 2018-02-12 | Discharge: 2018-02-12 | Disposition: A | Discharge status: Home / Self Care | Payer: Medicare Other | Source: Ambulatory Visit | Attending: Interventional Cardiology | Admitting: Interventional Cardiology

## 2018-02-12 DIAGNOSIS — Z8546 Personal history of malignant neoplasm of prostate: Secondary | ICD-10-CM | POA: Diagnosis not present

## 2018-02-12 DIAGNOSIS — I2510 Atherosclerotic heart disease of native coronary artery without angina pectoris: Secondary | ICD-10-CM | POA: Diagnosis not present

## 2018-02-12 DIAGNOSIS — Z7982 Long term (current) use of aspirin: Secondary | ICD-10-CM | POA: Diagnosis not present

## 2018-02-12 DIAGNOSIS — Z7984 Long term (current) use of oral hypoglycemic drugs: Secondary | ICD-10-CM | POA: Diagnosis not present

## 2018-02-12 DIAGNOSIS — Z951 Presence of aortocoronary bypass graft: Secondary | ICD-10-CM

## 2018-02-12 DIAGNOSIS — Z79899 Other long term (current) drug therapy: Secondary | ICD-10-CM | POA: Diagnosis not present

## 2018-02-14 ENCOUNTER — Encounter (HOSPITAL_COMMUNITY)
Admission: RE | Admit: 2018-02-14 | Discharge: 2018-02-14 | Disposition: A | Discharge status: Home / Self Care | Payer: Medicare Other | Source: Ambulatory Visit | Attending: Interventional Cardiology | Admitting: Interventional Cardiology

## 2018-02-14 DIAGNOSIS — Z7982 Long term (current) use of aspirin: Secondary | ICD-10-CM | POA: Diagnosis not present

## 2018-02-14 DIAGNOSIS — I2510 Atherosclerotic heart disease of native coronary artery without angina pectoris: Secondary | ICD-10-CM | POA: Diagnosis not present

## 2018-02-14 DIAGNOSIS — Z951 Presence of aortocoronary bypass graft: Secondary | ICD-10-CM | POA: Diagnosis not present

## 2018-02-14 DIAGNOSIS — Z8546 Personal history of malignant neoplasm of prostate: Secondary | ICD-10-CM | POA: Diagnosis not present

## 2018-02-14 DIAGNOSIS — Z7984 Long term (current) use of oral hypoglycemic drugs: Secondary | ICD-10-CM | POA: Diagnosis not present

## 2018-02-14 DIAGNOSIS — Z79899 Other long term (current) drug therapy: Secondary | ICD-10-CM | POA: Diagnosis not present

## 2018-02-17 ENCOUNTER — Encounter (HOSPITAL_COMMUNITY)
Admission: RE | Admit: 2018-02-17 | Discharge: 2018-02-17 | Disposition: A | Discharge status: Home / Self Care | Payer: Medicare Other | Source: Ambulatory Visit | Attending: Interventional Cardiology | Admitting: Interventional Cardiology

## 2018-02-17 DIAGNOSIS — I2510 Atherosclerotic heart disease of native coronary artery without angina pectoris: Secondary | ICD-10-CM | POA: Insufficient documentation

## 2018-02-17 DIAGNOSIS — Z8546 Personal history of malignant neoplasm of prostate: Secondary | ICD-10-CM | POA: Diagnosis not present

## 2018-02-17 DIAGNOSIS — Z7982 Long term (current) use of aspirin: Secondary | ICD-10-CM | POA: Insufficient documentation

## 2018-02-17 DIAGNOSIS — Z79899 Other long term (current) drug therapy: Secondary | ICD-10-CM | POA: Diagnosis not present

## 2018-02-17 DIAGNOSIS — Z951 Presence of aortocoronary bypass graft: Secondary | ICD-10-CM | POA: Insufficient documentation

## 2018-02-17 DIAGNOSIS — Z7984 Long term (current) use of oral hypoglycemic drugs: Secondary | ICD-10-CM | POA: Diagnosis not present

## 2018-02-19 ENCOUNTER — Encounter (HOSPITAL_COMMUNITY)
Admission: RE | Admit: 2018-02-19 | Discharge: 2018-02-19 | Disposition: A | Discharge status: Home / Self Care | Payer: Medicare Other | Source: Ambulatory Visit | Attending: Interventional Cardiology | Admitting: Interventional Cardiology

## 2018-02-19 DIAGNOSIS — I2510 Atherosclerotic heart disease of native coronary artery without angina pectoris: Secondary | ICD-10-CM | POA: Diagnosis not present

## 2018-02-19 DIAGNOSIS — Z7982 Long term (current) use of aspirin: Secondary | ICD-10-CM | POA: Diagnosis not present

## 2018-02-19 DIAGNOSIS — Z7984 Long term (current) use of oral hypoglycemic drugs: Secondary | ICD-10-CM | POA: Diagnosis not present

## 2018-02-19 DIAGNOSIS — Z951 Presence of aortocoronary bypass graft: Secondary | ICD-10-CM

## 2018-02-19 DIAGNOSIS — Z79899 Other long term (current) drug therapy: Secondary | ICD-10-CM | POA: Diagnosis not present

## 2018-02-19 DIAGNOSIS — Z8546 Personal history of malignant neoplasm of prostate: Secondary | ICD-10-CM | POA: Diagnosis not present

## 2018-02-21 ENCOUNTER — Encounter (HOSPITAL_COMMUNITY)
Admission: RE | Admit: 2018-02-21 | Discharge: 2018-02-21 | Disposition: A | Discharge status: Home / Self Care | Payer: Medicare Other | Source: Ambulatory Visit | Attending: Interventional Cardiology | Admitting: Interventional Cardiology

## 2018-02-21 DIAGNOSIS — Z7982 Long term (current) use of aspirin: Secondary | ICD-10-CM | POA: Diagnosis not present

## 2018-02-21 DIAGNOSIS — Z951 Presence of aortocoronary bypass graft: Secondary | ICD-10-CM

## 2018-02-21 DIAGNOSIS — I2510 Atherosclerotic heart disease of native coronary artery without angina pectoris: Secondary | ICD-10-CM | POA: Diagnosis not present

## 2018-02-21 DIAGNOSIS — Z7984 Long term (current) use of oral hypoglycemic drugs: Secondary | ICD-10-CM | POA: Diagnosis not present

## 2018-02-21 DIAGNOSIS — Z8546 Personal history of malignant neoplasm of prostate: Secondary | ICD-10-CM | POA: Diagnosis not present

## 2018-02-21 DIAGNOSIS — Z79899 Other long term (current) drug therapy: Secondary | ICD-10-CM | POA: Diagnosis not present

## 2018-02-24 ENCOUNTER — Encounter (HOSPITAL_COMMUNITY)
Admission: RE | Admit: 2018-02-24 | Discharge: 2018-02-24 | Disposition: A | Discharge status: Home / Self Care | Payer: Medicare Other | Source: Ambulatory Visit | Attending: Interventional Cardiology | Admitting: Interventional Cardiology

## 2018-02-24 DIAGNOSIS — Z951 Presence of aortocoronary bypass graft: Secondary | ICD-10-CM | POA: Diagnosis not present

## 2018-02-24 DIAGNOSIS — Z7982 Long term (current) use of aspirin: Secondary | ICD-10-CM | POA: Diagnosis not present

## 2018-02-24 DIAGNOSIS — Z7984 Long term (current) use of oral hypoglycemic drugs: Secondary | ICD-10-CM | POA: Diagnosis not present

## 2018-02-24 DIAGNOSIS — I2510 Atherosclerotic heart disease of native coronary artery without angina pectoris: Secondary | ICD-10-CM | POA: Diagnosis not present

## 2018-02-24 DIAGNOSIS — Z8546 Personal history of malignant neoplasm of prostate: Secondary | ICD-10-CM | POA: Diagnosis not present

## 2018-02-24 DIAGNOSIS — Z79899 Other long term (current) drug therapy: Secondary | ICD-10-CM | POA: Diagnosis not present

## 2018-02-25 ENCOUNTER — Ambulatory Visit: Payer: Medicare Other | Admitting: Internal Medicine

## 2018-02-26 ENCOUNTER — Encounter (HOSPITAL_COMMUNITY)
Admission: RE | Admit: 2018-02-26 | Discharge: 2018-02-26 | Disposition: A | Discharge status: Home / Self Care | Payer: Medicare Other | Source: Ambulatory Visit | Attending: Interventional Cardiology | Admitting: Interventional Cardiology

## 2018-02-26 ENCOUNTER — Encounter (HOSPITAL_COMMUNITY): Payer: Self-pay

## 2018-02-26 DIAGNOSIS — Z7982 Long term (current) use of aspirin: Secondary | ICD-10-CM | POA: Diagnosis not present

## 2018-02-26 DIAGNOSIS — I2510 Atherosclerotic heart disease of native coronary artery without angina pectoris: Secondary | ICD-10-CM | POA: Diagnosis not present

## 2018-02-26 DIAGNOSIS — Z951 Presence of aortocoronary bypass graft: Secondary | ICD-10-CM | POA: Diagnosis not present

## 2018-02-26 DIAGNOSIS — Z7984 Long term (current) use of oral hypoglycemic drugs: Secondary | ICD-10-CM | POA: Diagnosis not present

## 2018-02-26 DIAGNOSIS — Z79899 Other long term (current) drug therapy: Secondary | ICD-10-CM | POA: Diagnosis not present

## 2018-02-26 DIAGNOSIS — Z8546 Personal history of malignant neoplasm of prostate: Secondary | ICD-10-CM | POA: Diagnosis not present

## 2018-02-27 NOTE — Progress Notes (Signed)
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  ACCU-CHEK FASTCLIX LANCETS MISC, 1 each by Does not apply route 2 (two) times daily. Dx:E11.9, Disp: 100 each, Rfl: 11 .  aspirin EC 325 MG EC tablet, Take 1 tablet (325 mg total) daily by mouth., Disp: 30 tablet, Rfl: 0 .  Cholecalciferol (VITAMIN D3) 1000 UNITS CAPS, Take 1,000 Units by mouth daily. , Disp: , Rfl:  .  furosemide (LASIX) 40 MG tablet, Take 40 mg by mouth daily., Disp: , Rfl:  .  glimepiride (AMARYL) 2 MG tablet, Take 1 tablet (2 mg total) by mouth 2 (two) times daily., Disp: 180 tablet, Rfl: 3 .  glucose blood (ACCU-CHEK SMARTVIEW) test strip, 1 each by Other route 2 (two) times daily. Use to check blood sugars twice a day Dx E11.9, Disp: 100 each, Rfl: 3 .  glucose blood test strip, 1 each by Other route 2 (two) times daily. Please dispense brand of choice per pt and insurance preference. Dx:250.00, Disp: 100 each, Rfl: 11 .  glucose monitoring kit (FREESTYLE) monitoring kit, 1 each by Does not apply route as needed for other. Please dispense brand of choice per ins/patient. Dx: 250.00., Disp: 1 each, Rfl: 0 .  losartan (COZAAR) 100 MG tablet, Take 100 mg by mouth daily., Disp: , Rfl:  .  metFORMIN (GLUCOPHAGE) 1000 MG tablet, TAKE 1 TABLET BY MOUTH TWICE DAILY WITH MEALS (Patient taking differently: TAKE 1 TABLET (1000mg) BY MOUTH TWICE DAILY WITH MEALS), Disp: 180 tablet, Rfl: 3 .  metoprolol tartrate  (LOPRESSOR) 50 MG tablet, Take 1 tablet (50 mg total) by mouth 2 (two) times daily., Disp: 180 tablet, Rfl: 3 .  Multiple Vitamins-Minerals (MACULAR VITAMIN BENEFIT) TABS, Take 1 tablet by mouth 2 (two) times daily., Disp: , Rfl:  .  nitroGLYCERIN (NITROSTAT) 0.4 MG SL tablet, Place 1 tablet (0.4 mg total) under the tongue every 5 (five) minutes as needed for chest pain., Disp: 20 tablet, Rfl: 3 .  potassium chloride (K-DUR,KLOR-CON) 10 MEQ tablet, TAKE 1 TABLET BY MOUTH ONCE DAILY, Disp: 90 tablet, Rfl: 3 .  simvastatin (ZOCOR) 40 MG tablet, TAKE 1 TABLET BY MOUTH AT BEDTIME (Patient taking differently: TAKE 1 TABLET (40mg) BY MOUTH AT BEDTIME), Disp: 90 tablet, Rfl: 3 .  spironolactone (ALDACTONE) 25 MG tablet, TAKE 1/2 (ONE-HALF) TABLET BY MOUTH ONCE DAILY, Disp: 45 tablet, Rfl: 3  Past Medical History: Past Medical History:  Diagnosis Date  . Carotid stenosis, left   . Cataracts, bilateral    removed  . Coronary artery disease   . Hyperlipemia   . Prostate CA (HCC)   . Sleep apnea    Dr Chance ; uses CPAP  . Type II or unspecified type diabetes mellitus without mention of complication, not stated as uncontrolled   . Unspecified essential hypertension     Tobacco Use: Social History   Tobacco Use  Smoking Status Never Smoker  Smokeless Tobacco Never Used    Labs:   Recent Review Flowsheet Data    Labs for ITP Cardiac and Pulmonary Rehab Latest Ref Rng & Units 06/28/2017 06/28/2017 06/30/2017 08/06/2017 08/25/2017   Cholestrol 100 - 199 mg/dL - - - 128 -   LDLCALC 0 - 99 mg/dL - - - 66 -   HDL >39 mg/dL - - - 39(L) -   Trlycerides 0 - 149 mg/dL - - - 117 -   Hemoglobin A1c 4.8 - 5.6 % - - - - 6.2(H)   PHART 7.350 - 7.450 7.396 - - - -   PCO2ART 32.0 - 48.0 mmHg 39.9 - - - -   HCO3 20.0 - 28.0 mmol/L 24.3 - - - -   TCO2 22 - 32 mmol/L 26 24 - - -   ACIDBASEDEF 0.0 - 2.0 mmol/L - - - - -   O2SAT % 94.0 - 57.2 - -      Capillary Blood Glucose: Lab Results  Component Value  Date   GLUCAP 122 (H) 12/20/2017   GLUCAP 140 (H) 12/20/2017   GLUCAP 101 (H) 12/18/2017   GLUCAP 157 (H) 12/18/2017   GLUCAP 103 (H) 12/16/2017     Exercise Target Goals:    Exercise Program Goal: Individual exercise prescription set using results from initial 6 min walk test and THRR while considering  patient's activity barriers and safety.   Exercise Prescription Goal: Initial exercise prescription builds to 30-45 minutes a day of aerobic activity, 2-3 days per week.  Home exercise guidelines will be given to patient during program as part of exercise prescription that the participant will acknowledge.  Activity Barriers & Risk Stratification: Activity Barriers & Cardiac Risk Stratification - 12/10/17 1120      Activity Barriers & Cardiac Risk Stratification   Activity Barriers  None    Cardiac Risk Stratification  High       6 Minute Walk: 6 Minute Walk    Row Name 12/10/17 1116         6 Minute Walk   Phase  Initial     Distance  1482 feet     Walk Time  6 minutes     # of Rest Breaks  0     MPH  2.81     METS  2.74     RPE  12     VO2 Peak  9.6     Symptoms  No     Resting HR  65 bpm     Resting BP  130/70     Resting Oxygen Saturation   99 %     Exercise Oxygen Saturation  during 6 min walk  95 %     Max Ex. HR  89 bpm     Max Ex. BP  156/68     2 Minute Post BP  122/60        Oxygen Initial Assessment:   Oxygen Re-Evaluation:   Oxygen Discharge (Final Oxygen Re-Evaluation):   Initial Exercise Prescription: Initial Exercise Prescription - 12/10/17 1100      Date of Initial Exercise RX and Referring Provider   Date  12/10/17    Referring Provider  Daneen Schick, MD      Treadmill   MPH  2.1    Grade  0.5    Minutes  10    METs  2.75      Bike   Level  0.7    Minutes  10    METs  2.26      NuStep  Level  3    SPM  85    Minutes  10    METs  2.5      Prescription Details   Frequency (times per week)  3    Duration  Progress  to 30 minutes of continuous aerobic without signs/symptoms of physical distress      Intensity   THRR 40-80% of Max Heartrate  58-115    Ratings of Perceived Exertion  11-13    Perceived Dyspnea  0-4      Progression   Progression  Continue to progress workloads to maintain intensity without signs/symptoms of physical distress.      Resistance Training   Training Prescription  Yes    Weight  3lbs    Reps  10-15       Perform Capillary Blood Glucose checks as needed.  Exercise Prescription Changes: Exercise Prescription Changes    Row Name 12/23/17 1451 01/06/18 1451 01/20/18 1448 02/03/18 1448 02/17/18 1544     Response to Exercise   Blood Pressure (Admit)  124/52  112/62  120/80  108/70  108/58   Blood Pressure (Exercise)  144/68  128/74  138/82  128/70  156/70   Blood Pressure (Exit)  112/62  108/72  102/60  130/64  102/70   Heart Rate (Admit)  72 bpm  75 bpm  71 bpm  72 bpm  74 bpm   Heart Rate (Exercise)  103 bpm  112 bpm  109 bpm  107 bpm  111 bpm   Heart Rate (Exit)  75 bpm  75 bpm  79 bpm  72 bpm  78 bpm   Rating of Perceived Exertion (Exercise)  _0 Symptoms  none  none  none  none  none   Duration  Progress to 30 minutes of  aerobic without signs/symptoms of physical distress  Progress to 30 minutes of  aerobic without signs/symptoms of physical distress  Progress to 30 minutes of  aerobic without signs/symptoms of physical distress  Progress to 30 minutes of  aerobic without signs/symptoms of physical distress  Progress to 30 minutes of  aerobic without signs/symptoms of physical distress   Intensity  THRR unchanged  THRR unchanged  THRR unchanged  THRR unchanged  THRR unchanged     Progression   Progression  Continue to progress workloads to maintain intensity without signs/symptoms of physical distress.  Continue to progress workloads to maintain intensity without signs/symptoms of physical distress.  Continue to progress workloads to maintain  intensity without signs/symptoms of physical distress.  Continue to progress workloads to maintain intensity without signs/symptoms of physical distress.  Continue to progress workloads to maintain intensity without signs/symptoms of physical distress.   Average METs  2.6  2.9  2.9  2.9  3.3     Resistance Training   Training Prescription  Yes  Yes  Yes  Yes  Yes   Weight  3lbs  5lbs  5lbs  5lbs  5lbs   Reps  10-15  10-15  10-15  10-15  10-15   Time  10 Minutes  10 Minutes  10 Minutes  10 Minutes  10 Minutes     Interval Training   Interval Training  No  No  No  No  No     Treadmill   MPH  2.1  2.7  2.7  2.7  3   Grade  0._1 1  Minutes  10  10  10  10  10   METs  2.9  3.44  3.44  3.44  3.71     Bike   Level  0.9  1  1  1  1   Minutes  10  10  10  10  10   METs  2.62  2.77  2.77  2.77  2.77     NuStep   Level  3  4  4  4  4   SPM  85  85  85  85  85   Minutes  10  10  10  10  10   METs  2.4  2.6  2.6  2.5  3.4     Home Exercise Plan   Plans to continue exercise at  -  Home (comment)  Home (comment)  Home (comment)  Home (comment)   Frequency  -  Add 1 additional day to program exercise sessions.  Add 1 additional day to program exercise sessions.  Add 1 additional day to program exercise sessions.  Add 1 additional day to program exercise sessions.   Initial Home Exercises Provided  -  12/27/17  12/27/17  12/27/17  12/27/17      Exercise Comments: Exercise Comments    Row Name 12/25/17 1500 12/27/17 1508 01/06/18 1451 01/20/18 1520 02/03/18 1533   Exercise Comments  Reviewed METs and goals with patient.  Reviewed home exercise guidelines with patient.  Reviewed METs with patient.  Reviewed METs and goals with patient.  Reviewed METs with patient.   Row Name 02/17/18 1544           Exercise Comments  Reviewed METs and goals with patient.          Exercise Goals and Review: Exercise Goals    Row Name 12/10/17 1123             Exercise Goals   Increase  Physical Activity  Yes       Intervention  Provide advice, education, support and counseling about physical activity/exercise needs.;Develop an individualized exercise prescription for aerobic and resistive training based on initial evaluation findings, risk stratification, comorbidities and participant's personal goals.       Expected Outcomes  Long Term: Exercising regularly at least 3-5 days a week.;Short Term: Attend rehab on a regular basis to increase amount of physical activity.;Long Term: Add in home exercise to make exercise part of routine and to increase amount of physical activity.       Increase Strength and Stamina  Yes       Intervention  Provide advice, education, support and counseling about physical activity/exercise needs.;Develop an individualized exercise prescription for aerobic and resistive training based on initial evaluation findings, risk stratification, comorbidities and participant's personal goals.       Expected Outcomes  Short Term: Increase workloads from initial exercise prescription for resistance, speed, and METs.;Long Term: Improve cardiorespiratory fitness, muscular endurance and strength as measured by increased METs and functional capacity (6MWT)       Able to understand and use rate of perceived exertion (RPE) scale  Yes       Intervention  Provide education and explanation on how to use RPE scale       Expected Outcomes  Short Term: Able to use RPE daily in rehab to express subjective intensity level;Long Term:  Able to use RPE to guide intensity level when exercising independently       Knowledge and understanding of   Target Heart Rate Range (THRR)  Yes       Intervention  Provide education and explanation of THRR including how the numbers were predicted and where they are located for reference       Expected Outcomes  Short Term: Able to state/look up THRR;Long Term: Able to use THRR to govern intensity when exercising independently;Short Term: Able to use daily  as guideline for intensity in rehab       Able to check pulse independently  Yes       Intervention  Review the importance of being able to check your own pulse for safety during independent exercise;Provide education and demonstration on how to check pulse in carotid and radial arteries.       Expected Outcomes  Short Term: Able to explain why pulse checking is important during independent exercise;Long Term: Able to check pulse independently and accurately       Understanding of Exercise Prescription  Yes       Intervention  Provide education, explanation, and written materials on patient's individual exercise prescription       Expected Outcomes  Short Term: Able to explain program exercise prescription;Long Term: Able to explain home exercise prescription to exercise independently          Exercise Goals Re-Evaluation : Exercise Goals Re-Evaluation    Row Name 12/25/17 1500 12/27/17 1508 01/20/18 1448 02/17/18 1544       Exercise Goal Re-Evaluation   Exercise Goals Review  Increase Physical Activity  Increase Physical Activity  Increase Physical Activity  Increase Physical Activity    Comments  Patient is not currently exercisng on the days he doesn't attend cardiac rehab. Will review home exercise guidelines with patient. Pt understands and is able to use the RPE scale appropriately.  Reviewed home exercise guidelines with patient including THRR, RPE scale, and endpoints for exercise.   Patient isn't walking at the moment for exercise but does play golf 2 days/week. Encouraged patient to try and add at least 1 additional day of exercise in addition to exercise at [facility] to help achieve personal health and fitness goals.  Patient is only exercising at cardiac rehab. Tried to encourage patient to add some exercise walking in addition to exercise at cardiac rehab.    Expected Outcomes  Increase workloads as tolerated.  Patient will add 1-2 days walking for 30 minutes to help increase strength and  endurance.  Increase workloads to help increase MET level.  Pt will add at least one day of walking in addition to exercise at [facility] in order to help achieve personal health and fitness goals.        Discharge Exercise Prescription (Final Exercise Prescription Changes): Exercise Prescription Changes - 02/17/18 1544      Response to Exercise   Blood Pressure (Admit)  108/58    Blood Pressure (Exercise)  156/70    Blood Pressure (Exit)  102/70    Heart Rate (Admit)  74 bpm    Heart Rate (Exercise)  111 bpm    Heart Rate (Exit)  78 bpm    Rating of Perceived Exertion (Exercise)  12    Symptoms  none    Duration  Progress to 30 minutes of  aerobic without signs/symptoms of physical distress    Intensity  THRR unchanged      Progression   Progression  Continue to progress workloads to maintain intensity without signs/symptoms of physical distress.    Average METs  3.3  Resistance Training   Training Prescription  Yes    Weight  5lbs    Reps  10-15    Time  10 Minutes      Interval Training   Interval Training  No      Treadmill   MPH  3    Grade  1    Minutes  10    METs  3.71      Bike   Level  1    Minutes  10    METs  2.77      NuStep   Level  4    SPM  85    Minutes  10    METs  3.4      Home Exercise Plan   Plans to continue exercise at  Home (comment)    Frequency  Add 1 additional day to program exercise sessions.    Initial Home Exercises Provided  12/27/17       Nutrition:  Target Goals: Understanding of nutrition guidelines, daily intake of sodium <1550m, cholesterol <2037m calories 30% from fat and 7% or less from saturated fats, daily to have 5 or more servings of fruits and vegetables.  Biometrics: Pre Biometrics - 12/10/17 1124      Pre Biometrics   Height  6' (1.829 m)    Weight  234 lb 5.6 oz (106.3 kg)    Waist Circumference  43.5 inches    Hip Circumference  44.5 inches    Waist to Hip Ratio  0.98 %    BMI (Calculated)  31.78     Triceps Skinfold  28 mm    % Body Fat  33.1 %    Grip Strength  39.5 kg    Flexibility  11 in    Single Leg Stand  6.76 seconds        Nutrition Therapy Plan and Nutrition Goals: Nutrition Therapy & Goals - 12/16/17 0933      Nutrition Therapy   Diet  Consistent Carb, Heart Healthy      Personal Nutrition Goals   Nutrition Goal  Pt to identify and limit food sources of saturated fat, trans fat, and sodium    Personal Goal #2  Pt to identify food quantities necessary to achieve weight loss of 6-24 lb (2.7-10.9 kg) at graduation from cardiac rehab. Goal wt of 200 lb desired.       Intervention Plan   Intervention  Prescribe, educate and counsel regarding individualized specific dietary modifications aiming towards targeted core components such as weight, hypertension, lipid management, diabetes, heart failure and other comorbidities.    Expected Outcomes  Short Term Goal: Understand basic principles of dietary content, such as calories, fat, sodium, cholesterol and nutrients.;Long Term Goal: Adherence to prescribed nutrition plan.       Nutrition Assessments: Nutrition Assessments - 12/16/17 0933      MEDFICTS Scores   Pre Score  --  (Significant)  returned incomplete       Nutrition Goals Re-Evaluation:   Nutrition Goals Re-Evaluation:   Nutrition Goals Discharge (Final Nutrition Goals Re-Evaluation):   Psychosocial: Target Goals: Acknowledge presence or absence of significant depression and/or stress, maximize coping skills, provide positive support system. Participant is able to verbalize types and ability to use techniques and skills needed for reducing stress and depression.  Initial Review & Psychosocial Screening: Initial Psych Review & Screening - 12/10/17 0844      Initial Review   Current issues with  None Identified  Family Dynamics   Good Support System?  Yes spouse, family      Barriers   Psychosocial barriers to participate in program  There are  no identifiable barriers or psychosocial needs.      Screening Interventions   Interventions  Encouraged to exercise       Quality of Life Scores: Quality of Life - 12/10/17 1130      Quality of Life Scores   Health/Function Pre  26.47 %    Socioeconomic Pre  24.86 %    Psych/Spiritual Pre  28.57 %    Family Pre  26.4 %    GLOBAL Pre  26.56 %      Scores of 19 and below usually indicate a poorer quality of life in these areas.  A difference of  2-3 points is a clinically meaningful difference.  A difference of 2-3 points in the total score of the Quality of Life Index has been associated with significant improvement in overall quality of life, self-image, physical symptoms, and general health in studies assessing change in quality of life.  PHQ-9: Recent Review Flowsheet Data    Depression screen PHQ 2/9 12/16/2017 08/29/2017 01/09/2016   Decreased Interest 0 0 1   Down, Depressed, Hopeless 0 0 0   PHQ - 2 Score 0 0 1     Interpretation of Total Score  Total Score Depression Severity:  1-4 = Minimal depression, 5-9 = Mild depression, 10-14 = Moderate depression, 15-19 = Moderately severe depression, 20-27 = Severe depression   Psychosocial Evaluation and Intervention:   Psychosocial Re-Evaluation: Psychosocial Re-Evaluation    Row Name 01/02/18 1434 02/27/18 1408           Psychosocial Re-Evaluation   Current issues with  None Identified  None Identified      Interventions  Encouraged to attend Cardiac Rehabilitation for the exercise  Encouraged to attend Cardiac Rehabilitation for the exercise      Continue Psychosocial Services   No Follow up required  No Follow up required         Psychosocial Discharge (Final Psychosocial Re-Evaluation): Psychosocial Re-Evaluation - 02/27/18 1408      Psychosocial Re-Evaluation   Current issues with  None Identified    Interventions  Encouraged to attend Cardiac Rehabilitation for the exercise    Continue Psychosocial Services    No Follow up required       Vocational Rehabilitation: Provide vocational rehab assistance to qualifying candidates.   Vocational Rehab Evaluation & Intervention: Vocational Rehab - 12/10/17 0844      Initial Vocational Rehab Evaluation & Intervention   Assessment shows need for Vocational Rehabilitation  No       Education: Education Goals: Education classes will be provided on a weekly basis, covering required topics. Participant will state understanding/return demonstration of topics presented.  Learning Barriers/Preferences: Learning Barriers/Preferences - 12/09/17 1518      Learning Barriers/Preferences   Learning Barriers  None    Learning Preferences  Skilled Demonstration       Education Topics: Count Your Pulse:  -Group instruction provided by verbal instruction, demonstration, patient participation and written materials to support subject.  Instructors address importance of being able to find your pulse and how to count your pulse when at home without a heart monitor.  Patients get hands on experience counting their pulse with staff help and individually.   CARDIAC REHAB PHASE II EXERCISE from 02/26/2018 in [facility]  Date  02/14/18  Educator    Tara Everett  Instruction Review Code  2- Demonstrated Understanding      Heart Attack, Angina, and Risk Factor Modification:  -Group instruction provided by verbal instruction, video, and written materials to support subject.  Instructors address signs and symptoms of angina and heart attacks.    Also discuss risk factors for heart disease and how to make changes to improve heart health risk factors.   CARDIAC REHAB PHASE II EXERCISE from 02/26/2018 in [facility]  Date  02/05/18  Instruction Review Code  2- Demonstrated Understanding      Functional Fitness:  -Group instruction provided by verbal instruction, demonstration, patient participation, and written  materials to support subject.  Instructors address safety measures for doing things around the house.  Discuss how to get up and down off the floor, how to pick things up properly, how to safely get out of a chair without assistance, and balance training.   CARDIAC REHAB PHASE II EXERCISE from 02/26/2018 in [facility]  Date  01/10/18  Educator  Amber Fair  Instruction Review Code  2- Demonstrated Understanding      Meditation and Mindfulness:  -Group instruction provided by verbal instruction, patient participation, and written materials to support subject.  Instructor addresses importance of mindfulness and meditation practice to help reduce stress and improve awareness.  Instructor also leads participants through a meditation exercise.    Stretching for Flexibility and Mobility:  -Group instruction provided by verbal instruction, patient participation, and written materials to support subject.  Instructors lead participants through series of stretches that are designed to increase flexibility thus improving mobility.  These stretches are additional exercise for major muscle groups that are typically performed during regular warm up and cool down.   Hands Only CPR:  -Group verbal, video, and participation provides a basic overview of AHA guidelines for community CPR. Role-play of emergencies allow participants the opportunity to practice calling for help and chest compression technique with discussion of AED use.   Hypertension: -Group verbal and written instruction that provides a basic overview of hypertension including the most recent diagnostic guidelines, risk factor reduction with self-care instructions and medication management.   CARDIAC REHAB PHASE II EXERCISE from 02/26/2018 in [facility]  Date  12/20/17  Instruction Review Code  2- Demonstrated Understanding       Nutrition I class: Heart Healthy Eating:  -Group  instruction provided by PowerPoint slides, verbal discussion, and written materials to support subject matter. The instructor gives an explanation and review of the Therapeutic Lifestyle Changes diet recommendations, which includes a discussion on lipid goals, dietary fat, sodium, fiber, plant stanol/sterol esters, sugar, and the components of a well-balanced, healthy diet.   CARDIAC REHAB PHASE II EXERCISE from 02/26/2018 in [facility]  Date  01/24/18  Educator  RD      Nutrition II class: Lifestyle Skills:  -Group instruction provided by PowerPoint slides, verbal discussion, and written materials to support subject matter. The instructor gives an explanation and review of label reading, grocery shopping for heart health, heart healthy recipe modifications, and ways to make healthier choices when eating out.   CARDIAC REHAB PHASE II EXERCISE from 02/26/2018 in [facility]  Date  01/24/18  Educator  RD      Diabetes Question & Answer:  -Group instruction provided by PowerPoint slides, verbal discussion, and written materials to support subject matter. The instructor gives an   explanation and review of diabetes co-morbidities, pre- and post-prandial blood glucose goals, pre-exercise blood glucose goals, signs, symptoms, and treatment of hypoglycemia and hyperglycemia, and foot care basics.   CARDIAC REHAB PHASE II EXERCISE from 02/26/2018 in [facility]  Date  12/27/17  Educator  RD  Instruction Review Code  2- Demonstrated Understanding      Diabetes Blitz:  -Group instruction provided by PowerPoint slides, verbal discussion, and written materials to support subject matter. The instructor gives an explanation and review of the physiology behind type 1 and type 2 diabetes, diabetes medications and rational behind using different medications, pre- and post-prandial blood glucose recommendations and  Hemoglobin A1c goals, diabetes diet, and exercise including blood glucose guidelines for exercising safely.    CARDIAC REHAB PHASE II EXERCISE from 02/26/2018 in [facility]  Date  01/24/18  Educator  RD      Portion Distortion:  -Group instruction provided by PowerPoint slides, verbal discussion, written materials, and food models to support subject matter. The instructor gives an explanation of serving size versus portion size, changes in portions sizes over the last 20 years, and what consists of a serving from each food group.   CARDIAC REHAB PHASE II EXERCISE from 02/26/2018 in [facility]  Date  01/08/18  Educator  RD  Instruction Review Code  2- Demonstrated Understanding      Stress Management:  -Group instruction provided by verbal instruction, video, and written materials to support subject matter.  Instructors review role of stress in heart disease and how to cope with stress positively.     CARDIAC REHAB PHASE II EXERCISE from 02/26/2018 in [facility]  Date  01/15/18  Educator  RN  Instruction Review Code  2- Demonstrated Understanding      Exercising on Your Own:  -Group instruction provided by verbal instruction, power point, and written materials to support subject.  Instructors discuss benefits of exercise, components of exercise, frequency and intensity of exercise, and end points for exercise.  Also discuss use of nitroglycerin and activating EMS.  Review options of places to exercise outside of rehab.  Review guidelines for sex with heart disease.   CARDIAC REHAB PHASE II EXERCISE from 02/26/2018 in [facility]  Date  02/12/18  Educator  EP  Instruction Review Code  2- Demonstrated Understanding      Cardiac Drugs I:  -Group instruction provided by verbal instruction and written materials to support subject.  Instructor reviews cardiac drug  classes: antiplatelets, anticoagulants, beta blockers, and statins.  Instructor discusses reasons, side effects, and lifestyle considerations for each drug class.   CARDIAC REHAB PHASE II EXERCISE from 02/26/2018 in [facility]  Date  02/26/18  Educator  -- [Pharmacy]  Instruction Review Code  2- Demonstrated Understanding      Cardiac Drugs II:  -Group instruction provided by verbal instruction and written materials to support subject.  Instructor reviews cardiac drug classes: angiotensin converting enzyme inhibitors (ACE-I), angiotensin II receptor blockers (ARBs), nitrates, and calcium channel blockers.  Instructor discusses reasons, side effects, and lifestyle considerations for each drug class.   CARDIAC REHAB PHASE II EXERCISE from 02/26/2018 in [facility]  Date  01/29/18  Educator  Pharmacist  Instruction Review Code  2- Demonstrated Understanding      Anatomy and Physiology of the Circulatory System:  Group verbal and written   instruction and models provide basic cardiac anatomy and physiology, with the coronary electrical and arterial systems. Review of: AMI, Angina, Valve disease, Heart Failure, Peripheral Artery Disease, Cardiac Arrhythmia, Pacemakers, and the ICD.   CARDIAC REHAB PHASE II EXERCISE from 02/26/2018 in [place]  Date  01/22/18  Instruction Review Code  2- Demonstrated Understanding      Other Education:  -Group or individual verbal, written, or video instructions that support the educational goals of the cardiac rehab program.   Holiday Eating Survival Tips:  -Group instruction provided by PowerPoint slides, verbal discussion, and written materials to support subject matter. The instructor gives patients tips, tricks, and techniques to help them not only survive but enjoy the holidays despite the onslaught of food that accompanies the holidays.   Knowledge  Questionnaire Score: Knowledge Questionnaire Score - 12/10/17 0844      Knowledge Questionnaire Score   Pre Score  21/24       Core Components/Risk Factors/Patient Goals at Admission: Personal Goals and Risk Factors at Admission - 12/10/17 1114      Core Components/Risk Factors/Patient Goals on Admission    Weight Management  Yes;Obesity    Intervention  Weight Management: Develop a combined nutrition and exercise program designed to reach desired caloric intake, while maintaining appropriate intake of nutrient and fiber, sodium and fats, and appropriate energy expenditure required for the weight goal.;Weight Management: Provide education and appropriate resources to help participant work on and attain dietary goals.;Weight Management/Obesity: Establish reasonable short term and long term weight goals.;Obesity: Provide education and appropriate resources to help participant work on and attain dietary goals.    Admit Weight  234 lb 5.6 oz (106.3 kg)    Goal Weight: Short Term  228 lb (103.4 kg)    Goal Weight: Long Term  200 lb (90.7 kg)    Expected Outcomes  Short Term: Continue to assess and modify interventions until short term weight is achieved;Long Term: Adherence to nutrition and physical activity/exercise program aimed toward attainment of established weight goal;Weight Loss: Understanding of general recommendations for a balanced deficit meal plan, which promotes 1-2 lb weight loss per week and includes a negative energy balance of [phone] kcal/d;Understanding recommendations for meals to include 15-35% energy as protein, 25-35% energy from fat, 35-60% energy from carbohydrates, less than 263m of dietary cholesterol, 20-35 gm of total fiber daily;Understanding of distribution of calorie intake throughout the day with the consumption of 4-5 meals/snacks    Diabetes  Yes    Intervention  Provide education about signs/symptoms and action to take for hypo/hyperglycemia.;Provide education  about proper nutrition, including hydration, and aerobic/resistive exercise prescription along with prescribed medications to achieve blood glucose in normal ranges: Fasting glucose 65-99 mg/dL    Expected Outcomes  Short Term: Participant verbalizes understanding of the signs/symptoms and immediate care of hyper/hypoglycemia, proper foot care and importance of medication, aerobic/resistive exercise and nutrition plan for blood glucose control.;Long Term: Attainment of HbA1C < 7%.    Hypertension  Yes    Intervention  Provide education on lifestyle modifcations including regular physical activity/exercise, weight management, moderate sodium restriction and increased consumption of fresh fruit, vegetables, and low fat dairy, alcohol moderation, and smoking cessation.;Monitor prescription use compliance.    Expected Outcomes  Short Term: Continued assessment and intervention until BP is < 140/992mHG in hypertensive participants. < 130/808mG in hypertensive participants with diabetes, heart failure or chronic kidney disease.;Long Term: Maintenance of blood pressure at goal levels.  Lipids  Yes    Intervention  Provide education and support for participant on nutrition & aerobic/resistive exercise along with prescribed medications to achieve LDL <23m, HDL >439m    Expected Outcomes  Short Term: Participant states understanding of desired cholesterol values and is compliant with medications prescribed. Participant is following exercise prescription and nutrition guidelines.;Long Term: Cholesterol controlled with medications as prescribed, with individualized exercise RX and with personalized nutrition plan. Value goals: LDL < 7076mHDL > 40 mg.       Core Components/Risk Factors/Patient Goals Review:  Goals and Risk Factor Review    Row Name 01/02/18 1431 01/30/18 1559 02/27/18 1408         Core Components/Risk Factors/Patient Goals Review   Personal Goals Review  Weight  Management/Obesity;Lipids;Diabetes;Hypertension  Weight Management/Obesity;Lipids;Diabetes;Hypertension  Weight Management/Obesity;Lipids;Diabetes;Hypertension     Review  [name] vital signs and CBG's have been stable at phase 2 cardiac rehab.  [name] vital signs and CBG's have been stable at phase 2 cardiac rehab.  [name] vital signs and CBG's have been stable at phase 2 cardiac rehab.     Expected Outcomes  BobMikki Santeell continue to take his medicaitons as presribed and come to cardiac rehab to exercise.  BobMikki Santeell continue to take his medicaitons as presribed and participate in phase 2 cardiac rehab.  BobMikki Santeell continue to take his medicaitons as presribed and participate in phase 2 cardiac rehab.        Core Components/Risk Factors/Patient Goals at Discharge (Final Review):  Goals and Risk Factor Review - 02/27/18 1408      Core Components/Risk Factors/Patient Goals Review   Personal Goals Review  Weight Management/Obesity;Lipids;Diabetes;Hypertension    Review  [name] vital signs and CBG's have been stable at phase 2 cardiac rehab.    Expected Outcomes  BobMikki Santeell continue to take his medicaitons as presribed and participate in phase 2 cardiac rehab.       ITP Comments: ITP Comments    Row Name 12/10/17 0843 01/02/18 1425 01/30/18 1559 02/27/18 1408     ITP Comments  Dr. TraFransico Himedical Director   30 Day ITP comment. Patient with good attendance and partcipation in phase 2 cardiac rehab  30 Day ITP comment. Patient with good attendance and partcipation in phase 2 cardiac rehab  30 Day ITP comment. Patient with good attendance and partcipation in phase 2 cardiac rehab       Comments: See ITP comments. BobMikki Santees been doing well with exercise. BobMikki Santeell be completing cardiac rehab at the end of the month and is interested in participating in continuing exercise in the maintenance program.Maria WhiVenetia MaxonN,BSN 02/27/2018 2:14 PM

## 2018-02-28 ENCOUNTER — Encounter (HOSPITAL_COMMUNITY)
Admission: RE | Admit: 2018-02-28 | Discharge: 2018-02-28 | Disposition: A | Discharge status: Home / Self Care | Payer: Medicare Other | Source: Ambulatory Visit | Attending: Interventional Cardiology | Admitting: Interventional Cardiology

## 2018-02-28 DIAGNOSIS — I2510 Atherosclerotic heart disease of native coronary artery without angina pectoris: Secondary | ICD-10-CM | POA: Diagnosis not present

## 2018-02-28 DIAGNOSIS — Z79899 Other long term (current) drug therapy: Secondary | ICD-10-CM | POA: Diagnosis not present

## 2018-02-28 DIAGNOSIS — Z8546 Personal history of malignant neoplasm of prostate: Secondary | ICD-10-CM | POA: Diagnosis not present

## 2018-02-28 DIAGNOSIS — Z951 Presence of aortocoronary bypass graft: Secondary | ICD-10-CM | POA: Diagnosis not present

## 2018-02-28 DIAGNOSIS — Z7984 Long term (current) use of oral hypoglycemic drugs: Secondary | ICD-10-CM | POA: Diagnosis not present

## 2018-02-28 DIAGNOSIS — Z7982 Long term (current) use of aspirin: Secondary | ICD-10-CM | POA: Diagnosis not present

## 2018-03-03 ENCOUNTER — Encounter (HOSPITAL_COMMUNITY)
Admission: RE | Admit: 2018-03-03 | Discharge: 2018-03-03 | Disposition: A | Discharge status: Home / Self Care | Payer: Medicare Other | Source: Ambulatory Visit | Attending: Interventional Cardiology | Admitting: Interventional Cardiology

## 2018-03-03 ENCOUNTER — Inpatient Hospital Stay (HOSPITAL_COMMUNITY): Admission: RE | Admit: 2018-03-03 | Payer: Medicare Other | Source: Ambulatory Visit

## 2018-03-03 ENCOUNTER — Ambulatory Visit: Payer: Medicare Other | Admitting: Surgery

## 2018-03-03 DIAGNOSIS — Z79899 Other long term (current) drug therapy: Secondary | ICD-10-CM | POA: Diagnosis not present

## 2018-03-03 DIAGNOSIS — Z8546 Personal history of malignant neoplasm of prostate: Secondary | ICD-10-CM | POA: Diagnosis not present

## 2018-03-03 DIAGNOSIS — Z951 Presence of aortocoronary bypass graft: Secondary | ICD-10-CM | POA: Diagnosis not present

## 2018-03-03 DIAGNOSIS — Z7982 Long term (current) use of aspirin: Secondary | ICD-10-CM | POA: Diagnosis not present

## 2018-03-03 DIAGNOSIS — Z7984 Long term (current) use of oral hypoglycemic drugs: Secondary | ICD-10-CM | POA: Diagnosis not present

## 2018-03-03 DIAGNOSIS — I2510 Atherosclerotic heart disease of native coronary artery without angina pectoris: Secondary | ICD-10-CM | POA: Diagnosis not present

## 2018-03-05 ENCOUNTER — Ambulatory Visit (HOSPITAL_COMMUNITY)
Admission: RE | Admit: 2018-03-05 | Discharge: 2018-03-05 | Disposition: A | Discharge status: Home / Self Care | Payer: Medicare Other | Source: Ambulatory Visit | Attending: Surgery | Admitting: Surgery

## 2018-03-05 ENCOUNTER — Encounter (HOSPITAL_COMMUNITY)
Admission: RE | Admit: 2018-03-05 | Discharge: 2018-03-05 | Disposition: A | Discharge status: Home / Self Care | Payer: Medicare Other | Source: Ambulatory Visit | Attending: Interventional Cardiology | Admitting: Interventional Cardiology

## 2018-03-05 VITALS — Ht 72.0 in | Wt 234.3 lb

## 2018-03-05 DIAGNOSIS — I6523 Occlusion and stenosis of bilateral carotid arteries: Secondary | ICD-10-CM | POA: Diagnosis not present

## 2018-03-05 DIAGNOSIS — Z951 Presence of aortocoronary bypass graft: Secondary | ICD-10-CM

## 2018-03-05 DIAGNOSIS — I6522 Occlusion and stenosis of left carotid artery: Secondary | ICD-10-CM | POA: Diagnosis not present

## 2018-03-07 ENCOUNTER — Telehealth (HOSPITAL_COMMUNITY): Payer: Self-pay | Admitting: Internal Medicine

## 2018-03-07 ENCOUNTER — Encounter (HOSPITAL_COMMUNITY): Payer: Medicare Other

## 2018-03-10 ENCOUNTER — Encounter (HOSPITAL_COMMUNITY): Payer: Medicare Other

## 2018-03-10 ENCOUNTER — Encounter (HOSPITAL_COMMUNITY): Payer: Self-pay | Admitting: *Deleted

## 2018-03-10 NOTE — Progress Notes (Signed)
sDischarge Progress Report  Patient Details  Name: Larry [name] MRN: [id number] Date of Birth: [birth year] Referring Provider:     Katherine from 12/10/2017 in [place]  Referring Provider  Larry Schick, MD       Number of Visits: 36  Reason for Discharge:  Patient reached a stable level of exercise.  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   ADL UCSD:   Initial Exercise Prescription:   Discharge Exercise Prescription (Final Exercise Prescription Changes): Exercise Prescription Changes - 03/05/18 1508      Response to Exercise   Blood Pressure (Admit)  118/76    Blood Pressure (Exercise)  128/70    Blood Pressure (Exit)  102/64    Heart Rate (Admit)  73 bpm    Heart Rate (Exercise)  113 bpm    Heart Rate (Exit)  78 bpm    Rating of Perceived Exertion (Exercise)  12    Symptoms  none    Duration  Progress to 30 minutes of  aerobic without signs/symptoms of physical distress    Intensity  THRR unchanged      Progression   Progression  Continue to progress workloads to maintain intensity without signs/symptoms of physical distress.    Average METs  3.6      Resistance Training   Training Prescription  No   Relaxation day, no weights.     Interval Training   Interval Training  No      Treadmill   MPH  3    Grade  1    Minutes  10    METs  3.71      Bike   Level  1.6    Minutes  10    METs  3.87      NuStep   Level  5    SPM  85    Minutes  10    METs  3.3      Home Exercise Plan   Plans to continue exercise at  Home (comment)    Frequency  Add 1 additional day to program exercise sessions.    Initial Home Exercises Provided  12/27/17       Functional Capacity: 6 Minute Walk    Row Name 02/28/18 1519         6 Minute Walk   Phase  Discharge     Distance  1759 feet     Distance % Change  18.69 %     Walk Time  6 minutes     # of Rest Breaks  0     MPH  3.33     METS  3.55     RPE  12     VO2 Peak  12.43     Symptoms  No     Resting HR  75 bpm     Resting BP  144/56     Max Ex. HR  111 bpm     Max Ex. BP  162/82     2 Minute Post BP  118/64        Psychological, QOL, Others - Outcomes: PHQ 2/9: Depression screen [name] 2/9 03/10/2018 12/16/2017 08/29/2017 01/09/2016  Decreased Interest 0 0 0 1  Down, Depressed, Hopeless 0 0 0 0  PHQ - 2 Score 0 0 0 1    Quality of  Life: Quality of Life - 03/10/18 1436      Quality of Life   Select  Quality of Life      Quality of Life Scores   Health/Function Pre  26.47 %    Health/Function Post  26.07 %    Health/Function % Change  -1.51 %    Socioeconomic Pre  24.86 %    Socioeconomic Post  25.36 %    Socioeconomic % Change   2.01 %    Psych/Spiritual Pre  28.57 %    Psych/Spiritual Post  24.86 %    Psych/Spiritual % Change  -12.99 %    Family Pre  26.4 %    Family Post  26.1 %    Family % Change  -1.14 %    GLOBAL Pre  26.56 %    GLOBAL Post  25.68 %    GLOBAL % Change  -3.31 %       Personal Goals: Goals established at orientation with interventions provided to work toward goal.    Personal Goals Discharge: Goals and Risk Factor Review    Row Name 01/30/18 1559 02/27/18 1408           Core Components/Risk Factors/Patient Goals Review   Personal Goals Review  Weight Management/Obesity;Lipids;Diabetes;Hypertension  Weight Management/Obesity;Lipids;Diabetes;Hypertension      Review  Larry [name] vital signs and CBG's have been stable at phase 2 cardiac rehab.  Larry [name] vital signs and CBG's have been stable at phase 2 cardiac rehab.      Expected Outcomes  Larry [name] will continue to take his medicaitons as presribed and participate in phase 2 cardiac rehab.  Larry [name] will continue to take his medicaitons as presribed and participate in phase 2 cardiac rehab.         Exercise Goals and Review:   Nutrition &  Weight - Outcomes:  Post Biometrics - 03/05/18 1508       Post  Biometrics   Height  6' (1.829 m)    Weight  234 lb 5.6 oz (106.3 kg)    Waist Circumference  42.75 inches    Hip Circumference  43.25 inches    Waist to Hip Ratio  0.99 %    BMI (Calculated)  31.78    Triceps Skinfold  23 mm    % Body Fat  31.9 %    Grip Strength  44.5 kg    Flexibility  8 in   pt states back feels "tight" today   Single Leg Stand  4.78 seconds   bilateral neuropathy feet      Nutrition:   Nutrition Discharge: Nutrition Assessments - 04/08/18 1418      MEDFICTS Scores   Pre Score  56    Post Score  --   pt did not return MEDFICTS      Education Questionnaire Score: Knowledge Questionnaire Score - 03/10/18 1436      Knowledge Questionnaire Score   Pre Score  21/24    Post Score  23/24       Goals reviewed with patient; copy given to patient. Pt graduated from cardiac rehab program on 03/05/18 with completion of 36 exercise sessions in Phase II. Pt maintained good attendance and progressed nicely during his participation in rehab as evidenced by increased MET level.   Medication list reconciled. Repeat  PHQ score- 0 .  Pt has made significant lifestyle changes and should be commended for his success. Pt feels he has achieved his goals during cardiac rehab.   Pt plans  to continue exercise by walking and going to the gym. Larry [name] increased his distance on his post exercise walk test. Larry [name] maintained his current weight. We are proud of Larry [name] progress.[name], RN,BSN 04/08/2018 4:27 PM

## 2018-03-12 ENCOUNTER — Encounter (HOSPITAL_COMMUNITY): Payer: Medicare Other

## 2018-03-13 ENCOUNTER — Encounter: Payer: Self-pay | Admitting: Surgery

## 2018-03-13 ENCOUNTER — Ambulatory Visit (INDEPENDENT_AMBULATORY_CARE_PROVIDER_SITE_OTHER): Payer: Medicare Other | Admitting: Surgery

## 2018-03-13 ENCOUNTER — Other Ambulatory Visit: Payer: Self-pay

## 2018-03-13 VITALS — BP 145/65 | HR 65 | Resp 20 | Ht 72.0 in | Wt 236.0 lb

## 2018-03-13 DIAGNOSIS — I6522 Occlusion and stenosis of left carotid artery: Secondary | ICD-10-CM

## 2018-03-13 DIAGNOSIS — I259 Chronic ischemic heart disease, unspecified: Secondary | ICD-10-CM | POA: Diagnosis not present

## 2018-03-13 NOTE — Progress Notes (Signed)
[facility]  Patient name: Larry [name] MRN: [id number] DOB: September 29, 1940 Sex: male   REASON FOR VISIT:    Follow up  HISOTRY OF PRESENT ILLNESS:    Larry [name] is a 77 y.o. male who returns today for follow up.  He is status post left carotid endarterectomy with patch angioplasty on 08/22/2017.  This was done for asymptomatic left carotid stenosis.  Intraoperative findings included a fair amount of inflammatory tissue around the internal carotid artery.  The disease went into the internal carotid artery approximately 2.5 cm, therefore a shunt could not be placed.  His postoperative course was uncomplicated he was discharged home.  He was readmitted for pulmonary edema which responded to Lasix.  The patient's carotid stenosis was detected during a preoperative workup for CABG.  He is doing well today without any complaints.  He denies numbness or weakness in either extremity.  He denies slurred speech.  He denies amaurosis fugax.  PAST MEDICAL HISTORY:   Past Medical History:  Diagnosis Date  . Carotid stenosis, left   . Cataracts, bilateral    removed  . Coronary artery disease   . Hyperlipemia   . Prostate CA ([place])   . Sleep apnea    Dr Ledora Bottcher ; uses CPAP  . Type II or unspecified type diabetes mellitus without mention of complication, not stated as uncontrolled   . Unspecified essential hypertension      FAMILY HISTORY:   Family History  Problem Relation Age of Onset  . Heart disease Mother   . Diabetes Unknown   . Hypertension Unknown   . Colon cancer Neg Hx     SOCIAL HISTORY:   Social History   Tobacco Use  . Smoking status: Never Smoker  . Smokeless tobacco: Never Used  Substance Use Topics  . Alcohol use: No    Alcohol/week: 0.0 oz     ALLERGIES:   Allergies  Allergen Reactions  . Amlodipine Swelling    Leg swelling  . Verapamil Swelling    Edema w/high dose  .  Clindamycin Hcl     UNSPECIFIED REACTION   . Contrast Media [Iodinated Diagnostic Agents] Nausea And Vomiting     CURRENT MEDICATIONS:   Current Outpatient Medications  Medication Sig Dispense Refill  . ACCU-CHEK FASTCLIX LANCETS MISC 1 each by Does not apply route 2 (two) times daily. Dx:E11.9 100 each 11  . aspirin EC 325 MG EC tablet Take 1 tablet (325 mg total) daily by mouth. 30 tablet 0  . Cholecalciferol (VITAMIN D3) 1000 UNITS CAPS Take 1,000 Units by mouth daily.     . furosemide (LASIX) 40 MG tablet Take 40 mg by mouth daily.    [name] glimepiride (AMARYL) 2 MG tablet Take 1 tablet (2 mg total) by mouth 2 (two) times daily. 180 tablet 3  . glucose blood (ACCU-CHEK SMARTVIEW) test strip 1 each by Other route 2 (two) times daily. Use to check blood sugars twice a day Dx E11.9 100 each 3  . glucose blood test strip 1 each by Other route 2 (two) times daily. Please dispense brand of choice per pt and insurance preference. Dx:250.00 100 each 11  . glucose monitoring kit (FREESTYLE) monitoring kit 1 each by Does not apply route as needed for other. Please dispense brand of choice per ins/patient. Dx: 250.00. 1 each 0  . losartan (COZAAR) 100 MG tablet Take 100 mg by mouth daily.    . metFORMIN (GLUCOPHAGE) 1000 MG  tablet TAKE 1 TABLET BY MOUTH TWICE DAILY WITH MEALS (Patient taking differently: TAKE 1 TABLET (1045m) BY MOUTH TWICE DAILY WITH MEALS) 180 tablet 3  . metoprolol tartrate (LOPRESSOR) 50 MG tablet Take 1 tablet (50 mg total) by mouth 2 (two) times daily. 180 tablet 3  . Multiple Vitamins-Minerals (MACULAR VITAMIN BENEFIT) TABS Take 1 tablet by mouth 2 (two) times daily.    . nitroGLYCERIN (NITROSTAT) 0.4 MG SL tablet Place 1 tablet (0.4 mg total) under the tongue every 5 (five) minutes as needed for chest pain. 20 tablet 3  . potassium chloride (K-DUR,KLOR-CON) 10 MEQ tablet TAKE 1 TABLET BY MOUTH ONCE DAILY 90 tablet 3  . simvastatin (ZOCOR) 40 MG tablet TAKE 1 TABLET BY MOUTH AT  BEDTIME (Patient taking differently: TAKE 1 TABLET (419m BY MOUTH AT BEDTIME) 90 tablet 3  . spironolactone (ALDACTONE) 25 MG tablet TAKE 1/2 (ONE-HALF) TABLET BY MOUTH ONCE DAILY 45 tablet 3   No current facility-administered medications for this visit.     REVIEW OF SYSTEMS:   _0  denotes positive finding, _1  denotes negative finding Cardiac  Comments:  Chest pain or chest pressure:    Shortness of breath upon exertion:    Short of breath when lying flat:    Irregular heart rhythm:        Vascular    Pain in calf, thigh, or hip brought on by ambulation:    Pain in feet at night that wakes you up from your sleep:     Blood clot in your veins:    Leg swelling:         Pulmonary    Oxygen at home:    Productive cough:     Wheezing:         Neurologic    Sudden weakness in arms or legs:     Sudden numbness in arms or legs:     Sudden onset of difficulty speaking or slurred speech:    Temporary loss of vision in one eye:     Problems with dizziness:         Gastrointestinal    Blood in stool:     Vomited blood:         Genitourinary    Burning when urinating:     Blood in urine:        Psychiatric    Major depression:         Hematologic    Bleeding problems:    Problems with blood clotting too easily:        Skin    Rashes or ulcers:        Constitutional    Fever or chills:      PHYSICAL EXAM:   Vitals:   03/13/18 0951 03/13/18 0953  BP: (!) 151/69 (!) 145/65  Pulse: 65   Resp: 20   SpO2: 98%   Weight: 236 lb (107 kg)   Height: 6' (1.829 m)     GENERAL: The patient is a well-nourished male, in no acute distress. The vital signs are documented above. CARDIAC: There is a regular rate and rhythm.  VASCULAR: No carotid bruits PULMONARY: Non-labored respirations  MUSCULOSKELETAL: There are no major deformities or cyanosis. NEUROLOGIC: No focal weakness or paresthesias are detected. SKIN: There are no ulcers or rashes noted. PSYCHIATRIC: The  patient has a normal affect.  STUDIES:   I have ordered and reviewed the carotid duplex with the following findings:  Right Carotid: Velocities in the right ICA  are consistent with a 40-59%        stenosis.  Left Carotid: Velocities in the left ICA are consistent with a 60-79% stenosis.  MEDICAL ISSUES:   Status post left carotid endarterectomy: The patient has elevated velocities on his surveillance ultrasound, placing him in a 60-79% category.  Based on the amount of inflammation that was encountered during his operation, this is not terribly surprising.  I discussed that we would continue to follow him with ultrasound surveillance.  I would consider stenting if the stenosis becomes greater than 80%.  He will follow-up in 6 months.    [name], MD [facility] [phone] Pager [phone]

## 2018-03-14 ENCOUNTER — Encounter (HOSPITAL_COMMUNITY): Payer: Medicare Other

## 2018-03-17 ENCOUNTER — Encounter (HOSPITAL_COMMUNITY): Payer: Medicare Other

## 2018-03-19 ENCOUNTER — Encounter (HOSPITAL_COMMUNITY): Payer: Medicare Other

## 2018-03-24 ENCOUNTER — Encounter: Payer: Self-pay | Admitting: Internal Medicine

## 2018-03-24 ENCOUNTER — Ambulatory Visit (INDEPENDENT_AMBULATORY_CARE_PROVIDER_SITE_OTHER): Payer: Medicare Other | Admitting: Internal Medicine

## 2018-03-24 DIAGNOSIS — E118 Type 2 diabetes mellitus with unspecified complications: Secondary | ICD-10-CM | POA: Diagnosis not present

## 2018-03-24 DIAGNOSIS — I259 Chronic ischemic heart disease, unspecified: Secondary | ICD-10-CM | POA: Diagnosis not present

## 2018-03-24 DIAGNOSIS — R5383 Other fatigue: Secondary | ICD-10-CM

## 2018-03-24 DIAGNOSIS — Z951 Presence of aortocoronary bypass graft: Secondary | ICD-10-CM | POA: Diagnosis not present

## 2018-03-24 MED ORDER — METOPROLOL TARTRATE 50 MG PO TABS: 25.0000 mg | ORAL_TABLET | Freq: Two times a day (BID) | ORAL | 3 refills | Status: DC

## 2018-03-24 NOTE — Assessment & Plan Note (Signed)
Metformin, Glimepiride  

## 2018-03-24 NOTE — Progress Notes (Signed)
Subjective:  Patient ID: Larry [name], male    DOB: December 04, 1940  Age: 77 y.o. MRN: [id number]  CC: No chief complaint on file.   HPI ROEN MACGOWAN presents for DM, HTN, CAD f/u C/o fatigue after 2-3 pm. It started after surgery  Outpatient Medications Prior to Visit  Medication Sig Dispense Refill  . ACCU-CHEK FASTCLIX LANCETS MISC 1 each by Does not apply route 2 (two) times daily. Dx:E11.9 100 each 11  . aspirin EC 325 MG EC tablet Take 1 tablet (325 mg total) daily by mouth. 30 tablet 0  . Cholecalciferol (VITAMIN D3) 1000 UNITS CAPS Take 1,000 Units by mouth daily.     . furosemide (LASIX) 40 MG tablet Take 40 mg by mouth daily.    [name] glimepiride (AMARYL) 2 MG tablet Take 1 tablet (2 mg total) by mouth 2 (two) times daily. 180 tablet 3  . glucose monitoring kit (FREESTYLE) monitoring kit 1 each by Does not apply route as needed for other. Please dispense brand of choice per ins/patient. Dx: 250.00. 1 each 0  . losartan (COZAAR) 100 MG tablet Take 100 mg by mouth daily.    . metFORMIN (GLUCOPHAGE) 1000 MG tablet TAKE 1 TABLET BY MOUTH TWICE DAILY WITH MEALS (Patient taking differently: TAKE 1 TABLET ('1000mg'$ ) BY MOUTH TWICE DAILY WITH MEALS) 180 tablet 3  . metoprolol tartrate (LOPRESSOR) 50 MG tablet Take 1 tablet (50 mg total) by mouth 2 (two) times daily. 180 tablet 3  . Multiple Vitamins-Minerals (MACULAR VITAMIN BENEFIT) TABS Take 1 tablet by mouth 2 (two) times daily.    . nitroGLYCERIN (NITROSTAT) 0.4 MG SL tablet Place 1 tablet (0.4 mg total) under the tongue every 5 (five) minutes as needed for chest pain. 20 tablet 3  . potassium chloride (K-DUR,KLOR-CON) 10 MEQ tablet TAKE 1 TABLET BY MOUTH ONCE DAILY 90 tablet 3  . simvastatin (ZOCOR) 40 MG tablet TAKE 1 TABLET BY MOUTH AT BEDTIME (Patient taking differently: TAKE 1 TABLET ('40mg'$ ) BY MOUTH AT BEDTIME) 90 tablet 3  . spironolactone (ALDACTONE) 25 MG tablet TAKE 1/2 (ONE-HALF) TABLET BY MOUTH ONCE DAILY 45 tablet 3  .  glucose blood (ACCU-CHEK SMARTVIEW) test strip 1 each by Other route 2 (two) times daily. Use to check blood sugars twice a day Dx E11.9 100 each 3  . glucose blood test strip 1 each by Other route 2 (two) times daily. Please dispense brand of choice per pt and insurance preference. Dx:250.00 100 each 11   No facility-administered medications prior to visit.     ROS: Review of Systems  Constitutional: Positive for fatigue. Negative for appetite change and unexpected weight change.  HENT: Negative for congestion, nosebleeds, sneezing, sore throat and trouble swallowing.   Eyes: Negative for itching and visual disturbance.  Respiratory: Negative for cough.   Cardiovascular: Negative for chest pain, palpitations and leg swelling.  Gastrointestinal: Negative for abdominal distention, blood in stool, diarrhea and nausea.  Genitourinary: Negative for frequency and hematuria.  Musculoskeletal: Positive for back pain and gait problem. Negative for joint swelling and neck pain.  Skin: Negative for rash.  Neurological: Negative for dizziness, tremors, speech difficulty and weakness.  Psychiatric/Behavioral: Negative for agitation, dysphoric mood, sleep disturbance and suicidal ideas. The patient is not nervous/anxious.     Objective:  BP 136/72 (BP Location: Left Arm, Patient Position: Sitting, Cuff Size: Large)   Pulse 67   Temp 97.8 F (36.6 C) (Oral)   Ht 6' (1.829 m)   Wt 236  lb (107 kg)   SpO2 98%   BMI 32.01 kg/m   BP Readings from Last 3 Encounters:  03/24/18 136/72  03/13/18 (!) 145/65  01/01/18 124/72    Wt Readings from Last 3 Encounters:  03/24/18 236 lb (107 kg)  03/13/18 236 lb (107 kg)  01/01/18 234 lb (106.1 kg)    Physical Exam  Constitutional: He is oriented to person, place, and time. He appears well-developed. No distress.  NAD  HENT:  Mouth/Throat: Oropharynx is clear and moist.  Eyes: Pupils are equal, round, and reactive to light. Conjunctivae are normal.   Neck: Normal range of motion. No JVD present. No thyromegaly present.  Cardiovascular: Normal rate, regular rhythm, normal heart sounds and intact distal pulses. Exam reveals no gallop and no friction rub.  No murmur heard. Pulmonary/Chest: Effort normal and breath sounds normal. No respiratory distress. He has no wheezes. He has no rales. He exhibits no tenderness.  Abdominal: Soft. Bowel sounds are normal. He exhibits no distension and no mass. There is no tenderness. There is no rebound and no guarding.  Musculoskeletal: Normal range of motion. He exhibits no edema or tenderness.  Lymphadenopathy:    He has no cervical adenopathy.  Neurological: He is alert and oriented to person, place, and time. He has normal reflexes. No cranial nerve deficit. He exhibits normal muscle tone. He displays a negative Romberg sign. Coordination and gait normal.  Skin: Skin is warm and dry. No rash noted.  Psychiatric: He has a normal mood and affect. His behavior is normal. Judgment and thought content normal.    Lab Results  Component Value Date   WBC 10.1 08/27/2017   HGB 11.6 (L) 08/27/2017   HCT 36.6 (L) 08/27/2017   PLT 291 08/27/2017   GLUCOSE 211 (H) 09/27/2017   CHOL 128 08/06/2017   TRIG 117 08/06/2017   HDL 39 (L) 08/06/2017   LDLCALC 66 08/06/2017   ALT 13 (L) 08/26/2017   AST 15 08/26/2017   NA 141 09/27/2017   K 4.9 09/27/2017   CL 102 09/27/2017   CREATININE 1.25 09/27/2017   BUN 28 (H) 09/27/2017   CO2 24 09/27/2017   TSH 0.879 06/25/2017   PSA 0.00 Repeated and verified X2. (L) 12/26/2012   INR 1.01 08/19/2017   HGBA1C 6.2 (H) 08/25/2017    No results found.  Assessment & Plan:   There are no diagnoses linked to this encounter.   No orders of the defined types were placed in this encounter.    Follow-up: No follow-ups on file.  Walker Kehr, MD

## 2018-03-24 NOTE — Assessment & Plan Note (Signed)
reduce Metoprolol to 25 mg bid

## 2018-03-24 NOTE — Assessment & Plan Note (Signed)
No CP 

## 2018-03-24 NOTE — Patient Instructions (Signed)
Reduce Metoprolol to 25 mg twice a day

## 2018-03-25 ENCOUNTER — Other Ambulatory Visit (INDEPENDENT_AMBULATORY_CARE_PROVIDER_SITE_OTHER): Payer: Medicare Other

## 2018-03-25 DIAGNOSIS — E118 Type 2 diabetes mellitus with unspecified complications: Secondary | ICD-10-CM | POA: Diagnosis not present

## 2018-03-25 DIAGNOSIS — R5383 Other fatigue: Secondary | ICD-10-CM | POA: Diagnosis not present

## 2018-03-25 LAB — BASIC METABOLIC PANEL
BUN: 34 mg/dL — ABNORMAL HIGH (ref 6–23)
CALCIUM: 9.6 mg/dL (ref 8.4–10.5)
CO2: 25 meq/L (ref 19–32)
Chloride: 105 mEq/L (ref 96–112)
Creatinine, Ser: 1.46 mg/dL (ref 0.40–1.50)
GFR: 49.84 mL/min — AB (ref >60.00)
Glucose, Bld: 230 mg/dL — ABNORMAL HIGH (ref 70–99)
Potassium: 4.7 mEq/L (ref 3.5–5.1)
SODIUM: 137 meq/L (ref 135–145)

## 2018-03-25 LAB — TSH: TSH: 2.19 u[IU]/mL (ref 0.35–4.50)

## 2018-03-25 LAB — HEMOGLOBIN A1C: HEMOGLOBIN A1C: 6.9 % — AB (ref 4.6–6.5)

## 2018-04-07 ENCOUNTER — Other Ambulatory Visit: Payer: Self-pay

## 2018-04-07 DIAGNOSIS — I6529 Occlusion and stenosis of unspecified carotid artery: Secondary | ICD-10-CM

## 2018-04-07 NOTE — Addendum Note (Signed)
Encounter addended by: Sol Passer on: 04/07/2018 9:23 AM  Actions taken: Flowsheet accepted, Flowsheet data copied forward

## 2018-04-08 NOTE — Addendum Note (Signed)
Encounter addended by: Sol Passer on: 04/08/2018 11:27 AM  Actions taken: Flowsheet data copied forward, Visit Navigator Flowsheet section accepted

## 2018-04-08 NOTE — Addendum Note (Signed)
Encounter addended by: Sol Passer on: 04/08/2018 9:02 AM  Actions taken: Flowsheet data copied forward, Visit Navigator Flowsheet section accepted

## 2018-05-05 DIAGNOSIS — D1801 Hemangioma of skin and subcutaneous tissue: Secondary | ICD-10-CM | POA: Diagnosis not present

## 2018-05-05 DIAGNOSIS — L218 Other seborrheic dermatitis: Secondary | ICD-10-CM | POA: Diagnosis not present

## 2018-05-05 DIAGNOSIS — L570 Actinic keratosis: Secondary | ICD-10-CM | POA: Diagnosis not present

## 2018-06-12 ENCOUNTER — Encounter: Payer: Self-pay | Admitting: Interventional Cardiology

## 2018-06-18 DIAGNOSIS — D2310 Other benign neoplasm of skin of unspecified eyelid, including canthus: Secondary | ICD-10-CM | POA: Diagnosis not present

## 2018-06-18 DIAGNOSIS — H40013 Open angle with borderline findings, low risk, bilateral: Secondary | ICD-10-CM | POA: Diagnosis not present

## 2018-06-18 DIAGNOSIS — H01003 Unspecified blepharitis right eye, unspecified eyelid: Secondary | ICD-10-CM | POA: Diagnosis not present

## 2018-06-25 ENCOUNTER — Ambulatory Visit (INDEPENDENT_AMBULATORY_CARE_PROVIDER_SITE_OTHER): Payer: Medicare Other | Admitting: Internal Medicine

## 2018-06-25 ENCOUNTER — Encounter: Payer: Self-pay | Admitting: Internal Medicine

## 2018-06-25 VITALS — BP 128/72 | HR 70 | Temp 97.6°F | Ht 72.0 in | Wt 243.0 lb

## 2018-06-25 DIAGNOSIS — I10 Essential (primary) hypertension: Secondary | ICD-10-CM | POA: Diagnosis not present

## 2018-06-25 DIAGNOSIS — I5042 Chronic combined systolic (congestive) and diastolic (congestive) heart failure: Secondary | ICD-10-CM | POA: Diagnosis not present

## 2018-06-25 DIAGNOSIS — I259 Chronic ischemic heart disease, unspecified: Secondary | ICD-10-CM | POA: Diagnosis not present

## 2018-06-25 DIAGNOSIS — E1159 Type 2 diabetes mellitus with other circulatory complications: Secondary | ICD-10-CM

## 2018-06-25 DIAGNOSIS — Z951 Presence of aortocoronary bypass graft: Secondary | ICD-10-CM | POA: Diagnosis not present

## 2018-06-25 DIAGNOSIS — Z23 Encounter for immunization: Secondary | ICD-10-CM

## 2018-06-25 DIAGNOSIS — K5901 Slow transit constipation: Secondary | ICD-10-CM

## 2018-06-25 NOTE — Assessment & Plan Note (Signed)
Doing well 

## 2018-06-25 NOTE — Progress Notes (Signed)
Subjective:  Patient ID: Larry [name], male    DOB: [birth year]  Age: 77 y.o. MRN: [id number]  CC: No chief complaint on file.   HPI Larry [name] presents for CAD, HTN, DM-2 f/u  Outpatient Medications Prior to Visit  Medication Sig Dispense Refill  . ACCU-CHEK FASTCLIX LANCETS MISC 1 each by Does not apply route 2 (two) times daily. Dx:E11.9 100 each 11  . aspirin EC 325 MG EC tablet Take 1 tablet (325 mg total) daily by mouth. 30 tablet 0  . Cholecalciferol (VITAMIN D3) 1000 UNITS CAPS Take 1,000 Units by mouth daily.     . furosemide (LASIX) 40 MG tablet Take 40 mg by mouth daily.    [name] glimepiride (AMARYL) 2 MG tablet Take 1 tablet (2 mg total) by mouth 2 (two) times daily. 180 tablet 3  . glucose monitoring kit (FREESTYLE) monitoring kit 1 each by Does not apply route as needed for other. Please dispense brand of choice per ins/patient. Dx: 250.00. 1 each 0  . losartan (COZAAR) 100 MG tablet Take 100 mg by mouth daily.    . metFORMIN (GLUCOPHAGE) 1000 MG tablet TAKE 1 TABLET BY MOUTH TWICE DAILY WITH MEALS (Patient taking differently: TAKE 1 TABLET ('1000mg'$ ) BY MOUTH TWICE DAILY WITH MEALS) 180 tablet 3  . metoprolol tartrate (LOPRESSOR) 50 MG tablet Take 0.5 tablets (25 mg total) by mouth 2 (two) times daily. 180 tablet 3  . Multiple Vitamins-Minerals (MACULAR VITAMIN BENEFIT) TABS Take 1 tablet by mouth 2 (two) times daily.    . nitroGLYCERIN (NITROSTAT) 0.4 MG SL tablet Place 1 tablet (0.4 mg total) under the tongue every 5 (five) minutes as needed for chest pain. 20 tablet 3  . potassium chloride (K-DUR,KLOR-CON) 10 MEQ tablet TAKE 1 TABLET BY MOUTH ONCE DAILY 90 tablet 3  . simvastatin (ZOCOR) 40 MG tablet TAKE 1 TABLET BY MOUTH AT BEDTIME (Patient taking differently: TAKE 1 TABLET ('40mg'$ ) BY MOUTH AT BEDTIME) 90 tablet 3  . spironolactone (ALDACTONE) 25 MG tablet TAKE 1/2 (ONE-HALF) TABLET BY MOUTH ONCE DAILY 45 tablet 3   No facility-administered medications prior to visit.      ROS: Review of Systems  Constitutional: Positive for unexpected weight change. Negative for appetite change and fatigue.  HENT: Negative for congestion, nosebleeds, sneezing, sore throat and trouble swallowing.   Eyes: Negative for itching and visual disturbance.  Respiratory: Negative for cough.   Cardiovascular: Negative for chest pain, palpitations and leg swelling.  Gastrointestinal: Negative for abdominal distention, blood in stool, diarrhea and nausea.  Genitourinary: Negative for frequency and hematuria.  Musculoskeletal: Negative for back pain, gait problem, joint swelling and neck pain.  Skin: Negative for rash.  Neurological: Negative for dizziness, tremors, speech difficulty and weakness.  Psychiatric/Behavioral: Negative for agitation, dysphoric mood, sleep disturbance and suicidal ideas. The patient is not nervous/anxious.     Objective:  BP 128/72 (BP Location: Right Arm, Patient Position: Sitting, Cuff Size: Large)   Pulse 70   Temp 97.6 F (36.4 C) (Oral)   Ht 6' (1.829 m)   Wt 243 lb (110.2 kg)   SpO2 96%   BMI 32.96 kg/m   BP Readings from Last 3 Encounters:  06/25/18 128/72  03/24/18 136/72  03/13/18 (!) 145/65    Wt Readings from Last 3 Encounters:  06/25/18 243 lb (110.2 kg)  03/24/18 236 lb (107 kg)  03/13/18 236 lb (107 kg)    Physical Exam  Constitutional: He is oriented to person, place, and  time. He appears well-developed. No distress.  NAD  HENT:  Mouth/Throat: Oropharynx is clear and moist.  Eyes: Pupils are equal, round, and reactive to light. Conjunctivae are normal.  Neck: Normal range of motion. No JVD present. No thyromegaly present.  Cardiovascular: Normal rate, regular rhythm, normal heart sounds and intact distal pulses. Exam reveals no gallop and no friction rub.  No murmur heard. Pulmonary/Chest: Effort normal and breath sounds normal. No respiratory distress. He has no wheezes. He has no rales. He exhibits no tenderness.    Abdominal: Soft. Bowel sounds are normal. He exhibits no distension and no mass. There is no tenderness. There is no rebound and no guarding.  Musculoskeletal: Normal range of motion. He exhibits no edema or tenderness.  Lymphadenopathy:    He has no cervical adenopathy.  Neurological: He is alert and oriented to person, place, and time. He has normal reflexes. No cranial nerve deficit. He exhibits normal muscle tone. He displays a negative Romberg sign. Coordination and gait normal.  Skin: Skin is warm and dry. No rash noted.  Psychiatric: He has a normal mood and affect. His behavior is normal. Judgment and thought content normal.  Obese  Lab Results  Component Value Date   WBC 10.1 08/27/2017   HGB 11.6 (L) 08/27/2017   HCT 36.6 (L) 08/27/2017   PLT 291 08/27/2017   GLUCOSE 230 (H) 03/25/2018   CHOL 128 08/06/2017   TRIG 117 08/06/2017   HDL 39 (L) 08/06/2017   LDLCALC 66 08/06/2017   ALT 13 (L) 08/26/2017   AST 15 08/26/2017   NA 137 03/25/2018   K 4.7 03/25/2018   CL 105 03/25/2018   CREATININE 1.46 03/25/2018   BUN 34 (H) 03/25/2018   CO2 25 03/25/2018   TSH 2.19 03/25/2018   PSA 0.00 Repeated and verified X2. (L) 12/26/2012   INR 1.01 08/19/2017   HGBA1C 6.9 (H) 03/25/2018    Vas US Carotid  Result Date: 03/05/2018 Carotid Arterial Duplex Study Indications:       Carotid artery disease and Left Endarterectomy 08/22/2017. Risk Factors:      Hypertension, no history of smoking. Comparison Study:  CT 08/01/2017 Performing Technologist: Caralee Ates RVT, RDMS  Examination Guidelines: A complete evaluation includes B-mode imaging, spectral Doppler, color Doppler, and power Doppler as needed of all accessible portions of each vessel. Bilateral testing is considered an integral part of a complete examination. Limited examinations for reoccurring indications may be performed as noted.  Right Carotid Findings: +----------+--------+--------+--------+------------+--------+            PSV cm/sEDV cm/sStenosisDescribe    Comments +----------+--------+--------+--------+------------+--------+ CCA Prox  97      19              heterogenous         +----------+--------+--------+--------+------------+--------+ CCA Mid   108     21              heterogenous         +----------+--------+--------+--------+------------+--------+ CCA Distal160     35              heterogenous         +----------+--------+--------+--------+------------+--------+ ICA Prox  210     43      40-59%  heterogenousWith PST +----------+--------+--------+--------+------------+--------+ ICA Mid   200     31              heterogenous         +----------+--------+--------+--------+------------+--------+ ICA [id number]  48                                   +----------+--------+--------+--------+------------+--------+ ECA       174     13                                   +----------+--------+--------+--------+------------+--------+ +----------+--------+-------+----------------+-------------------+           PSV cm/sEDV cmsDescribe        Arm Pressure (mmHG) +----------+--------+-------+----------------+-------------------+ Subclavian180            Multiphasic, WNL                    +----------+--------+-------+----------------+-------------------+ +---------+--------+---+--------+--+---------+ VertebralPSV cm/s102EDV cm/s21Antegrade +---------+--------+---+--------+--+---------+  Left Carotid Findings: +----------+--------+--------+--------+------------+--------+           PSV cm/sEDV cm/sStenosisDescribe    Comments +----------+--------+--------+--------+------------+--------+ CCA Prox  98      18              heterogenous         +----------+--------+--------+--------+------------+--------+ CCA Mid   139     23              heterogenous         +----------+--------+--------+--------+------------+--------+ CCA Distal153     20               heterogenous         +----------+--------+--------+--------+------------+--------+ ICA Prox  146     27              heterogenous         +----------+--------+--------+--------+------------+--------+ ICA Mid   124     28              heterogenous         +----------+--------+--------+--------+------------+--------+ ICA Distal367     80      60-79%  heterogenousWith PST +----------+--------+--------+--------+------------+--------+ ECA       214     7                                    +----------+--------+--------+--------+------------+--------+ +----------+--------+--------+----------------+-------------------+ SubclavianPSV cm/sEDV cm/sDescribe        Arm Pressure (mmHG) +----------+--------+--------+----------------+-------------------+           230             Multiphasic, WNL                    +----------+--------+--------+----------------+-------------------+ +---------+--------+--+--------+--+---------+ VertebralPSV cm/s92EDV cm/s18Antegrade +---------+--------+--+--------+--+---------+  Final Interpretation: Right Carotid: Velocities in the right ICA are consistent with a 40-59%                stenosis. Left Carotid: Velocities in the left ICA are consistent with a 60-79% stenosis. Vertebrals:  Bilateral vertebral arteries demonstrate antegrade flow. Subclavians: Normal flow hemodynamics were seen in bilateral subclavian              arteries. *See table(s) above for measurements and observations.  Electronically signed by Deitra Mayo MD on 03/05/2018 at 9:57:36 AM.    Final     Assessment & Plan:   There are no diagnoses linked to this encounter.   No orders of the defined types were placed in this encounter.    Follow-up: No follow-ups on file.  Walker Kehr, MD

## 2018-06-25 NOTE — Assessment & Plan Note (Signed)
Aldactone Furosemide 

## 2018-06-25 NOTE — Assessment & Plan Note (Addendum)
  Aldactone Furosemide

## 2018-06-25 NOTE — Addendum Note (Signed)
Addended by: Karren Cobble on: 06/25/2018 03:32 PM   Modules accepted: Orders

## 2018-06-25 NOTE — Assessment & Plan Note (Signed)
Metformin, Glimepiride  

## 2018-07-11 ENCOUNTER — Ambulatory Visit: Payer: Medicare Other | Admitting: Interventional Cardiology

## 2018-08-14 ENCOUNTER — Encounter: Payer: Self-pay | Admitting: Interventional Cardiology

## 2018-08-25 ENCOUNTER — Ambulatory Visit (INDEPENDENT_AMBULATORY_CARE_PROVIDER_SITE_OTHER): Payer: Medicare Other | Admitting: Surgery

## 2018-08-25 ENCOUNTER — Ambulatory Visit (HOSPITAL_COMMUNITY)
Admission: RE | Admit: 2018-08-25 | Discharge: 2018-08-25 | Disposition: A | Discharge status: Home / Self Care | Payer: Medicare Other | Source: Ambulatory Visit | Attending: Surgery | Admitting: Surgery

## 2018-08-25 ENCOUNTER — Encounter: Payer: Self-pay | Admitting: Surgery

## 2018-08-25 ENCOUNTER — Other Ambulatory Visit: Payer: Self-pay

## 2018-08-25 VITALS — BP 136/56 | HR 58 | Temp 97.6°F | Resp 20 | Ht 72.0 in | Wt 243.0 lb

## 2018-08-25 DIAGNOSIS — I6523 Occlusion and stenosis of bilateral carotid arteries: Secondary | ICD-10-CM | POA: Diagnosis not present

## 2018-08-25 DIAGNOSIS — I6529 Occlusion and stenosis of unspecified carotid artery: Secondary | ICD-10-CM | POA: Diagnosis not present

## 2018-08-25 NOTE — Progress Notes (Signed)
[facility]  Patient name: Larry [name] MRN: [id number] DOB: October 07, 1940 Sex: male   REASON FOR VISIT:    Follow up  HISOTRY OF PRESENT ILLNESS:    Larry [name] a 78 y.o.malewho returns today for follow up.  He is status post left carotid endarterectomy with patch angioplasty on 08/22/2017. This was done for asymptomatic left carotid stenosis. Intraoperative findings included a fair amount of inflammatory tissue around the internal carotid artery. The disease went into the internal carotid artery approximately 2.5 cm, therefore a shunt could not be placed. His postoperative course was uncomplicated he was discharged home. He was readmitted for pulmonary edema which responded to Lasix.  The patient's carotid stenosis was detected during a preoperative workup for CABG. he has no complaints today.  He denies any neurologic symptoms.   PAST MEDICAL HISTORY:   Past Medical History:  Diagnosis Date  . Carotid stenosis, left   . Cataracts, bilateral    removed  . Coronary artery disease   . Hyperlipemia   . Prostate CA ([place])   . Sleep apnea    Dr Ledora Bottcher ; uses CPAP  . Type II or unspecified type diabetes mellitus without mention of complication, not stated as uncontrolled   . Unspecified essential hypertension      FAMILY HISTORY:   Family History  Problem Relation Age of Onset  . Heart disease Mother   . Diabetes Other   . Hypertension Other   . Colon cancer Neg Hx     SOCIAL HISTORY:   Social History   Tobacco Use  . Smoking status: Never Smoker  . Smokeless tobacco: Never Used  Substance Use Topics  . Alcohol use: No    Alcohol/week: 0.0 standard drinks     ALLERGIES:   Allergies  Allergen Reactions  . Amlodipine Swelling    Leg swelling  . Verapamil Swelling    Edema w/high dose  . Clindamycin Hcl     UNSPECIFIED REACTION   . Contrast Media [Iodinated Diagnostic Agents]  Nausea And Vomiting     CURRENT MEDICATIONS:   Current Outpatient Medications  Medication Sig Dispense Refill  . ACCU-CHEK FASTCLIX LANCETS MISC 1 each by Does not apply route 2 (two) times daily. Dx:E11.9 100 each 11  . aspirin EC 325 MG EC tablet Take 1 tablet (325 mg total) daily by mouth. 30 tablet 0  . Cholecalciferol (VITAMIN D3) 1000 UNITS CAPS Take 1,000 Units by mouth daily.     . furosemide (LASIX) 40 MG tablet Take 40 mg by mouth daily.    [name] glimepiride (AMARYL) 2 MG tablet Take 1 tablet (2 mg total) by mouth 2 (two) times daily. 180 tablet 3  . glucose monitoring kit (FREESTYLE) monitoring kit 1 each by Does not apply route as needed for other. Please dispense brand of choice per ins/patient. Dx: 250.00. 1 each 0  . losartan (COZAAR) 100 MG tablet Take 100 mg by mouth daily.    . metFORMIN (GLUCOPHAGE) 1000 MG tablet TAKE 1 TABLET BY MOUTH TWICE DAILY WITH MEALS (Patient taking differently: TAKE 1 TABLET ('1000mg'$ ) BY MOUTH TWICE DAILY WITH MEALS) 180 tablet 3  . metoprolol tartrate (LOPRESSOR) 50 MG tablet Take 0.5 tablets (25 mg total) by mouth 2 (two) times daily. 180 tablet 3  . Multiple Vitamins-Minerals (MACULAR VITAMIN BENEFIT) TABS Take 1 tablet by mouth 2 (two) times daily.    . nitroGLYCERIN (NITROSTAT) 0.4 MG SL tablet Place 1 tablet (0.4 mg total)  under the tongue every 5 (five) minutes as needed for chest pain. 20 tablet 3  . potassium chloride (K-DUR,KLOR-CON) 10 MEQ tablet TAKE 1 TABLET BY MOUTH ONCE DAILY 90 tablet 3  . simvastatin (ZOCOR) 40 MG tablet TAKE 1 TABLET BY MOUTH AT BEDTIME (Patient taking differently: TAKE 1 TABLET ('40mg'$ ) BY MOUTH AT BEDTIME) 90 tablet 3  . spironolactone (ALDACTONE) 25 MG tablet TAKE 1/2 (ONE-HALF) TABLET BY MOUTH ONCE DAILY 45 tablet 3   No current facility-administered medications for this visit.     REVIEW OF SYSTEMS:   '[X]'$  denotes positive finding, '[ ]'$  denotes negative finding Cardiac  Comments:  Chest pain or chest pressure:     Shortness of breath upon exertion:    Short of breath when lying flat:    Irregular heart rhythm:        Vascular    Pain in calf, thigh, or hip brought on by ambulation:    Pain in feet at night that wakes you up from your sleep:     Blood clot in your veins:    Leg swelling:         Pulmonary    Oxygen at home:    Productive cough:     Wheezing:         Neurologic    Sudden weakness in arms or legs:     Sudden numbness in arms or legs:     Sudden onset of difficulty speaking or slurred speech:    Temporary loss of vision in one eye:     Problems with dizziness:         Gastrointestinal    Blood in stool:     Vomited blood:         Genitourinary    Burning when urinating:     Blood in urine:        Psychiatric    Major depression:         Hematologic    Bleeding problems:    Problems with blood clotting too easily:        Skin    Rashes or ulcers:        Constitutional    Fever or chills:      PHYSICAL EXAM:   Vitals:   08/25/18 0930 08/25/18 0934  BP: (!) 127/57 (!) 136/56  Pulse: (!) 58   Resp: 20   Temp: 97.6 F (36.4 C)   SpO2: 97%   Weight: 243 lb (110.2 kg)   Height: 6' (1.829 m)     GENERAL: The patient is a well-nourished male, in no acute distress. The vital signs are documented above. CARDIAC: There is a regular rate and rhythm.  VASCULAR: No carotid bruits PULMONARY: Non-labored respirations MUSCULOSKELETAL: There are no major deformities or cyanosis. NEUROLOGIC: No focal weakness or paresthesias are detected. SKIN: There are no ulcers or rashes noted. PSYCHIATRIC: The patient has a normal affect.  STUDIES:   I have ordered and reviewed his vascular lab studies with the following findings:  Right Carotid: Velocities in the right ICA are consistent with a 40-59%                stenosis. Non-hemodynamically significant plaque <50% noted in                the CCA.  Left Carotid: Velocities in the left ICA are consistent with a  60-79% stenosis.               Non-hemodynamically significant  plaque noted in the CCA. Normal antegrade flow noted in the vertebral arteries.    . Normal flow hemodynamics were seen in bilateral subclavian arteries.  MEDICAL ISSUES:   Status post left carotid endarterectomy: The patient's stenosis, which is in the 60-79% category has remained stable over the past 6 months.  I plan on having him come back in 6 months for repeat ultrasound.  We discussed going to yearly surveillance if his disease remains stable.  If it progresses, I will consider stenting.    [name], MD [facility] [phone] Pager [phone]

## 2018-08-26 ENCOUNTER — Other Ambulatory Visit: Payer: Self-pay | Admitting: Internal Medicine

## 2018-09-07 ENCOUNTER — Telehealth: Payer: Self-pay

## 2018-09-07 NOTE — Telephone Encounter (Signed)
Patient meets inclusion criteria for current pharmacy residency project to initiate SGLT2i or GLP1-RA therapy for cardiovascular benefit due to current diagnosis of ASCVD, HF, and DM.  Patient has scheduled appointment with Dr. Tamala Julian on 09/11/18.   Pharmacist will discuss initiation of Jardiance 10mg  daily and enrollment into study with patient and provider at upcoming office visit.   Relevant labs and dates: Lab Results  Component Value Date   HGBA1C 6.9 (H) 03/25/2018   Lab Results  Component Value Date   CREATININE 1.46 03/25/2018   CREATININE 1.25 09/27/2017   CREATININE 1.14 09/18/2017   CrCl cannot be calculated (Patient's most recent lab result is older than the maximum 21 days allowed.). Wt Readings from Last 1 Encounters:  08/25/18 243 lb (110.2 kg)   BP Readings from Last 1 Encounters:  08/25/18 (!) 136/56    Metformin use: Tor Netters D PGY1 Pharmacy Resident   09/07/2018      7:06 PM

## 2018-09-10 NOTE — Progress Notes (Signed)
Cardiology Office Note:    Date:  09/11/2018   ID:  Larry [name], DOB Mar 16, 1941, MRN [id number]  PCP:  Cassandria Anger, MD  Cardiologist:  No primary care provider on file.   Referring MD: Cassandria Anger, MD   Chief Complaint  Patient presents with  . Coronary Artery Disease    History of Present Illness:    Larry [name] is a 78 y.o. male with a hx of CAD, coronary bypass grafting 2018 (LIMA to LAD, SVG to [place] Intermediate, SVG to OM, and SVG to RCA), obesity, type 2 diabetes, chronic combined systolic and diastolic heart failure (most recent hospital stay August 25, 2017,, carotid artery disease status post left carotid endarterectomy January 2019 and severe obesity.  LVEF 45%  He denies angina.  He has not had significant exertional dyspnea, orthopnea, PND, palpitations, or syncope.  He denies medication side effects.  Has been physically and active.  He does have a [employer] membership but has not partaken.  He is compliant with his medical regimen.  He complains of increased urinary frequency.  Past Medical History:  Diagnosis Date  . Carotid stenosis, left   . Cataracts, bilateral    removed  . Coronary artery disease   . Hyperlipemia   . Prostate CA ([place])   . Sleep apnea    Dr Ledora Bottcher ; uses CPAP  . Type II or unspecified type diabetes mellitus without mention of complication, not stated as uncontrolled   . Unspecified essential hypertension     Past Surgical History:  Procedure Laterality Date  . APPENDECTOMY    . CARDIAC CATHETERIZATION    . CATARACT EXTRACTION, BILATERAL    . CORONARY ARTERY BYPASS GRAFT N/A 06/27/2017   Procedure: CORONARY ARTERY BYPASS GRAFTING (CABG), time four   using the left internal mammary artery, and right saphenous leg vein harvested endoscopically.;  Surgeon: Ivin Poot, MD;  Location: [place];  Service: Open Heart Surgery;  Laterality: N/A;  . CYSTOSCOPY N/A 06/27/2017   Procedure: CYSTOSCOPY FLEXIBLE,  Balloon dilatation placement of foley catheter;  Surgeon: Ivin Poot, MD;  Location: [place];  Service: Open Heart Surgery;  Laterality: N/A;  . ENDARTERECTOMY Left 08/22/2017   Procedure: ENDARTERECTOMY CAROTID LEFT;  Surgeon: Serafina Mitchell, MD;  Location: [place];  Service: Vascular;  Laterality: Left;  . INTRAVASCULAR ULTRASOUND/IVUS N/A 06/24/2017   Procedure: Intravascular Ultrasound/IVUS;  Surgeon: Belva Crome, MD;  Location: [facility];  Service: Cardiovascular;  Laterality: N/A;  LEFT MAIN  . LEFT HEART CATH AND CORONARY ANGIOGRAPHY N/A 06/24/2017   Procedure: LEFT HEART CATH AND CORONARY ANGIOGRAPHY;  Surgeon: Belva Crome, MD;  Location: [place] [facility];  Service: Cardiovascular;  Laterality: N/A;  . PATCH ANGIOPLASTY Left 08/22/2017   Procedure: PATCH ANGIOPLASTY USING Rueben Bash BIOLOGIC PATCH;  Surgeon: Serafina Mitchell, MD;  Location: [place];  Service: Vascular;  Laterality: Left;  . PROSTATECTOMY    . TEE WITHOUT CARDIOVERSION N/A 06/27/2017   Procedure: TRANSESOPHAGEAL ECHOCARDIOGRAM (TEE);  Surgeon: Prescott Gum, Collier Salina, MD;  Location: [place];  Service: Open Heart Surgery;  Laterality: N/A;  . TONSILLECTOMY    . ULTRASOUND GUIDANCE FOR VASCULAR ACCESS  06/24/2017   Procedure: Ultrasound Guidance For Vascular Access;  Surgeon: Belva Crome, MD;  Location: [place] [facility];  Service: Cardiovascular;;    Current Medications: Current Meds  Medication Sig  . ACCU-CHEK FASTCLIX LANCETS MISC 1 each by Does not apply route 2 (two)  times daily. Dx:E11.9  . aspirin EC 325 MG EC tablet Take 1 tablet (325 mg total) daily by mouth.  . Cholecalciferol (VITAMIN D3) 1000 UNITS CAPS Take 1,000 Units by mouth daily.   . furosemide (LASIX) 40 MG tablet Take 1 tablet (40 mg total) by mouth daily.  [name] glimepiride (AMARYL) 2 MG tablet Take 1 tablet (2 mg total) by mouth 2 (two) times daily.  [name] glucose monitoring kit (FREESTYLE) monitoring kit 1 each by Does not apply route as needed for  other. Please dispense brand of choice per ins/patient. Dx: 250.00.  [name] losartan (COZAAR) 100 MG tablet Take 100 mg by mouth daily.  . metFORMIN (GLUCOPHAGE) 1000 MG tablet TAKE 1 TABLET BY MOUTH TWICE DAILY WITH MEALS (Patient taking differently: TAKE 1 TABLET ('1000mg'$ ) BY MOUTH TWICE DAILY WITH MEALS)  . metoprolol tartrate (LOPRESSOR) 50 MG tablet Take 0.5 tablets (25 mg total) by mouth 2 (two) times daily.  . Multiple Vitamins-Minerals (MACULAR VITAMIN BENEFIT) TABS Take 1 tablet by mouth 2 (two) times daily.  . nitroGLYCERIN (NITROSTAT) 0.4 MG SL tablet Place 1 tablet (0.4 mg total) under the tongue every 5 (five) minutes as needed for chest pain.  . potassium chloride (K-DUR,KLOR-CON) 10 MEQ tablet TAKE 1 TABLET BY MOUTH ONCE DAILY  . simvastatin (ZOCOR) 40 MG tablet TAKE 1 TABLET BY MOUTH AT BEDTIME (Patient taking differently: TAKE 1 TABLET ('40mg'$ ) BY MOUTH AT BEDTIME)  . spironolactone (ALDACTONE) 25 MG tablet TAKE 1/2 (ONE-HALF) TABLET BY MOUTH ONCE DAILY     Allergies:   Amlodipine; Verapamil; Clindamycin hcl; and Contrast media [iodinated diagnostic agents]   Social History   Socioeconomic History  . Marital status: Married    Spouse name: Not on file  . Number of children: Not on file  . Years of education: Not on file  . Highest education level: Bachelor's degree (e.g., BA, AB, BS)  Occupational History  . Occupation: Teaching laboratory technician  . Financial resource strain: Not hard at all  . Food insecurity:    Worry: Never true    Inability: Never true  . Transportation needs:    Medical: Not on file    Non-medical: Not on file  Tobacco Use  . Smoking status: Never Smoker  . Smokeless tobacco: Never Used  Substance and Sexual Activity  . Alcohol use: No    Alcohol/week: 0.0 standard drinks  . Drug use: No  . Sexual activity: Yes  Lifestyle  . Physical activity:    Days per week: 0 days    Minutes per session: 0 min  . Stress: Not at all  Relationships  . Social  connections:    Talks on phone: Not on file    Gets together: Not on file    Attends religious service: Not on file    Active member of club or organization: Not on file    Attends meetings of clubs or organizations: Not on file    Relationship status: Not on file  Other Topics Concern  . Not on file  Social History Narrative   Regular Exercise- no, golf     Family History: The patient's family history includes Diabetes in an other family member; Heart disease in his mother; Hypertension in an other family member. There is no history of Colon cancer.  ROS:   Please see the history of present illness.    Easy bruising.  Frequent urination.  All other systems reviewed and are negative.  EKGs/Labs/Other Studies Reviewed:  The following studies were reviewed today: 2D Doppler echocardiogram February 2019: Study Conclusions  - Left ventricle: The cavity size was normal. There was moderate   concentric hypertrophy. Systolic function was mildly reduced. The   estimated ejection fraction was in the range of 45% to 50%. There   is hypokinesis in the basal and mid anteroseptal, anterior and   apical septal walls. Doppler parameters are consistent with   abnormal left ventricular relaxation (grade 1 diastolic   dysfunction). There was no evidence of elevated ventricular   filling pressure by Doppler parameters. - Aortic valve: There was no regurgitation. - Mitral valve: Calcified annulus. Mildly thickened leaflets .   There was trivial regurgitation. - Left atrium: The atrium was moderately dilated. - Right ventricle: Systolic function was normal. - Right atrium: The atrium was normal in size. - Tricuspid valve: There was trivial regurgitation. - Pulmonary arteries: Systolic pressure was within the normal   range. - Pericardium, extracardiac: There was no pericardial effusion.  Impressions:  - Since the last exam on 06/26/2017 LVEF has mildly improved from   40-45% to  45-50%.  Bilateral carotid Doppler August 25, 2018: Summary: Right Carotid: Velocities in the right ICA are consistent with a 40-59%                stenosis. Non-hemodynamically significant plaque <50% noted in                the CCA.  Left Carotid: Velocities in the left ICA are consistent with a 60-79% stenosis.               Non-hemodynamically significant plaque noted in the CCA. Normal antegrade flow noted in the vertebral arteries.    . Normal flow hemodynamics were seen in bilateral subclavian arteries.  *See table(s) above for measurements and observations.     EKG:  EKG right bundle branch block, heart rate 74 bpm in sinus rhythm, premature ventricular contraction, first-degree AV block.  Bifascicular block noted.  When compared to the prior tracing performed in January 2019, first-degree AV block is new.  Recent Labs: 09/18/2017: NT-Pro BNP 3,601 03/25/2018: BUN 34; Creatinine, Ser 1.46; Potassium 4.7; Sodium 137; TSH 2.19  Recent Lipid Panel    Component Value Date/Time   CHOL 128 08/06/2017 1518   TRIG 117 08/06/2017 1518   HDL 39 (L) 08/06/2017 1518   CHOLHDL 3.3 08/06/2017 1518   CHOLHDL 3 06/18/2017 0847   VLDL 21.0 06/18/2017 0847   LDLCALC 66 08/06/2017 1518    Physical Exam:    VS:  Ht 6' (1.829 m)   Wt 245 lb 12.8 oz (111.5 kg)   BMI 33.34 kg/m     Wt Readings from Last 3 Encounters:  09/11/18 245 lb 12.8 oz (111.5 kg)  08/25/18 243 lb (110.2 kg)  06/25/18 243 lb (110.2 kg)     GEN: Obese.. No acute distress HEENT: Normal NECK: No JVD. LYMPHATICS: No lymphadenopathy CARDIAC: RRR.  No murmur, no gallop, no edema VASCULAR: 2+ bilateral radial pulses, no carotid bruits RESPIRATORY:  Clear to auscultation without rales, wheezing or rhonchi  ABDOMEN: Soft, non-tender, non-distended, No pulsatile mass, MUSCULOSKELETAL: No deformity  SKIN: Warm and dry NEUROLOGIC:  Alert and oriented x 3 PSYCHIATRIC:  Normal affect   ASSESSMENT:    1. Chronic  combined systolic and diastolic heart failure ([place])   2. Coronary artery disease involving coronary bypass graft of native heart with angina pectoris ([place])   3. OBSTRUCTIVE SLEEP APNEA  4. Essential hypertension   5. Dyslipidemia   6. Type 2 diabetes mellitus with other circulatory complication, without long-term current use of insulin ([facility])    PLAN:    In order of problems listed above:  1. Near normal LV systolic function.  No heart failure symptoms.  Continue ARB, convert metoprolol tartrate to metoprolol succinate 50 mg daily (monitor for progressive PR prolongation), and must consider adding an SGLT2/dapagliflozin which will give additional protection against development of frank heart failure.  Discontinue potassium supplementation.  Decrease furosemide to 20 mg/day.  Increase spironolactone to 25 mg/day.  Comprehensive metabolic panel in 7 to 10 days. 2. Secondary risk modification is discussed at length including the importance of 150 minutes of moderate aerobic activity per week. 3. Continue compliance with sleep apnea. 4. Target blood pressure 130/80 mmHg. 5. High intensity statin therapy should be continued with a target LDL less than 70.  Lipid panel in 7 to 10 days. 6. HAD SGLT2 THERAPY -DAPAGLIFLOZIN.  Will message primary care to initiate therapy.  Overall education and awareness concerning primary/secondary risk prevention was discussed in detail: LDL less than 70, hemoglobin A1c less than 7, blood pressure target less than 130/80 mmHg, >150 minutes of moderate aerobic activity per week, avoidance of smoking, weight control (via diet and exercise), and continued surveillance/management of/for obstructive sleep apnea.  In addition to targeting a hemoglobin A1c less than 7 by conventional means, additional therapies to decrease CV risk should be considered.  SGLT2 inhibitor therapy ("-gliflozin" e.g.Jardiance, Invokana) decreases risk of heart failure development.  GLP-1 agonist  therapy ("-glutide" agents e.g. Trulicity; or "-atide" agents e.g.Byetta) to reduce vascular events.  1-Year follow-up   Medication Adjustments/Labs and Tests Ordered: Current medicines are reviewed at length with the patient today.  Concerns regarding medicines are outlined above.  No orders of the defined types were placed in this encounter.  No orders of the defined types were placed in this encounter.   There are no Patient Instructions on file for this visit.   Signed, Larry Grooms, MD  09/11/2018 8:13 AM    [place]

## 2018-09-10 NOTE — Telephone Encounter (Signed)
I'm in favor. Is there something I need to do?

## 2018-09-11 ENCOUNTER — Encounter: Payer: Self-pay | Admitting: Interventional Cardiology

## 2018-09-11 ENCOUNTER — Ambulatory Visit (INDEPENDENT_AMBULATORY_CARE_PROVIDER_SITE_OTHER): Payer: Medicare Other | Admitting: Interventional Cardiology

## 2018-09-11 VITALS — BP 138/64 | Ht 72.0 in | Wt 245.8 lb

## 2018-09-11 DIAGNOSIS — I5042 Chronic combined systolic (congestive) and diastolic (congestive) heart failure: Secondary | ICD-10-CM | POA: Diagnosis not present

## 2018-09-11 DIAGNOSIS — I25709 Atherosclerosis of coronary artery bypass graft(s), unspecified, with unspecified angina pectoris: Secondary | ICD-10-CM | POA: Diagnosis not present

## 2018-09-11 DIAGNOSIS — E1159 Type 2 diabetes mellitus with other circulatory complications: Secondary | ICD-10-CM | POA: Diagnosis not present

## 2018-09-11 DIAGNOSIS — I10 Essential (primary) hypertension: Secondary | ICD-10-CM | POA: Diagnosis not present

## 2018-09-11 DIAGNOSIS — E785 Hyperlipidemia, unspecified: Secondary | ICD-10-CM

## 2018-09-11 DIAGNOSIS — I6523 Occlusion and stenosis of bilateral carotid arteries: Secondary | ICD-10-CM

## 2018-09-11 DIAGNOSIS — G4733 Obstructive sleep apnea (adult) (pediatric): Secondary | ICD-10-CM | POA: Diagnosis not present

## 2018-09-11 MED ORDER — ASPIRIN EC 81 MG PO TBEC: 81.0000 mg | DELAYED_RELEASE_TABLET | Freq: Every day | ORAL | 3 refills | Status: DC

## 2018-09-11 MED ORDER — METOPROLOL SUCCINATE ER 50 MG PO TB24: 50.0000 mg | ORAL_TABLET | Freq: Every day | ORAL | 3 refills | Status: DC

## 2018-09-11 MED ORDER — FUROSEMIDE 20 MG PO TABS: 20.0000 mg | ORAL_TABLET | Freq: Every day | ORAL | 3 refills | Status: DC

## 2018-09-11 MED ORDER — SPIRONOLACTONE 25 MG PO TABS: 25.0000 mg | ORAL_TABLET | Freq: Every day | ORAL | 3 refills | Status: DC

## 2018-09-11 NOTE — Patient Instructions (Signed)
Medication Instructions:  1) DISCONTINUE Metoprolol Tartrate 2) DISCONTINUE Potassium 3) START Metoprolol Succinate 50mg  once daily 4) INCREASE Spironolactone to 25mg  once daily 5) DECREASE Aspirin to 81mg  once daily 6) DECREASE Furosemide to 20mg  once daily  If you need a refill on your cardiac medications before your next appointment, please call your pharmacy.   Lab work: Your physician recommends that you return for lab work in: 7-10 days (Lipid, liver, BMET).  Please make sure you are fasting for these labs (nothing to eat or drink after midnight except water and black coffee.    If you have labs (blood work) drawn today and your tests are completely normal, you will receive your results only by: [name] MyChart Message (if you have MyChart) OR . A paper copy in the mail If you have any lab test that is abnormal or we need to change your treatment, we will call you to review the results.  Testing/Procedures: None  Follow-Up: At [facility], you and your health needs are our priority.  As part of our continuing mission to provide you with exceptional heart care, we have created designated Provider Care Teams.  These Care Teams include your primary Cardiologist (physician) and Advanced Practice Providers (APPs -  Physician Assistants and Nurse Practitioners) who all work together to provide you with the care you need, when you need it. You will need a follow up appointment in 12 months.  Please call our office 2 months in advance to schedule this appointment.  You may see Dr. Tamala Julian or one of the following Advanced Practice Providers on your designated Care Team:   Truitt Merle, NP Cecilie Kicks, NP . Kathyrn Drown, NP  Any Other Special Instructions Will Be Listed Below (If Applicable).

## 2018-09-12 NOTE — Telephone Encounter (Signed)
Unable to reach patient while in office for his appointment Thursday. Called patient to discuss initiation of therapy and meeting with a pharmacist when he is in for labs 09/19/18. Left message. Will call patient again Monday.

## 2018-09-13 ENCOUNTER — Other Ambulatory Visit: Payer: Self-pay | Admitting: Internal Medicine

## 2018-09-19 ENCOUNTER — Other Ambulatory Visit: Payer: Self-pay | Admitting: *Deleted

## 2018-09-19 ENCOUNTER — Other Ambulatory Visit: Payer: Medicare Other | Admitting: *Deleted

## 2018-09-19 DIAGNOSIS — I25709 Atherosclerosis of coronary artery bypass graft(s), unspecified, with unspecified angina pectoris: Secondary | ICD-10-CM | POA: Diagnosis not present

## 2018-09-19 DIAGNOSIS — E785 Hyperlipidemia, unspecified: Secondary | ICD-10-CM | POA: Diagnosis not present

## 2018-09-19 DIAGNOSIS — E1159 Type 2 diabetes mellitus with other circulatory complications: Secondary | ICD-10-CM

## 2018-09-19 DIAGNOSIS — E875 Hyperkalemia: Secondary | ICD-10-CM

## 2018-09-19 DIAGNOSIS — I5042 Chronic combined systolic (congestive) and diastolic (congestive) heart failure: Secondary | ICD-10-CM | POA: Diagnosis not present

## 2018-09-19 DIAGNOSIS — I10 Essential (primary) hypertension: Secondary | ICD-10-CM

## 2018-09-19 LAB — HEPATIC FUNCTION PANEL
ALK PHOS: 81 IU/L (ref 39–117)
ALT: 19 IU/L (ref 0–44)
AST: 14 IU/L (ref 0–40)
Albumin: 4.3 g/dL (ref 3.7–4.7)
BILIRUBIN, DIRECT: 0.09 mg/dL (ref 0.00–0.40)
Bilirubin Total: 0.5 mg/dL (ref 0.0–1.2)
Total Protein: 6.6 g/dL (ref 6.0–8.5)

## 2018-09-19 LAB — LIPID PANEL
Chol/HDL Ratio: 3.5 ratio (ref 0.0–5.0)
Cholesterol, Total: 137 mg/dL (ref 100–199)
HDL: 39 mg/dL — AB (ref >39)
LDL Calculated: 67 mg/dL (ref 0–99)
TRIGLYCERIDES: 156 mg/dL — AB (ref 0–149)
VLDL Cholesterol Cal: 31 mg/dL (ref 5–40)

## 2018-09-19 LAB — BASIC METABOLIC PANEL
BUN / CREAT RATIO: 23 (ref 10–24)
BUN: 36 mg/dL — ABNORMAL HIGH (ref 8–27)
CHLORIDE: 104 mmol/L (ref 96–106)
CO2: 21 mmol/L (ref 20–29)
Calcium: 9.4 mg/dL (ref 8.6–10.2)
Creatinine, Ser: 1.6 mg/dL — ABNORMAL HIGH (ref 0.76–1.27)
GFR calc Af Amer: 48 mL/min/{1.73_m2} — ABNORMAL LOW (ref >59)
GFR calc non Af Amer: 41 mL/min/{1.73_m2} — ABNORMAL LOW (ref >59)
GLUCOSE: 212 mg/dL — AB (ref 65–99)
Potassium: 5.6 mmol/L — ABNORMAL HIGH (ref 3.5–5.2)
SODIUM: 140 mmol/L (ref 134–144)

## 2018-09-22 ENCOUNTER — Ambulatory Visit (INDEPENDENT_AMBULATORY_CARE_PROVIDER_SITE_OTHER): Payer: Medicare Other | Admitting: Internal Medicine

## 2018-09-22 ENCOUNTER — Encounter: Payer: Self-pay | Admitting: Internal Medicine

## 2018-09-22 VITALS — BP 134/68 | HR 65 | Temp 98.0°F | Ht 72.0 in | Wt 246.0 lb

## 2018-09-22 DIAGNOSIS — E785 Hyperlipidemia, unspecified: Secondary | ICD-10-CM

## 2018-09-22 DIAGNOSIS — E1159 Type 2 diabetes mellitus with other circulatory complications: Secondary | ICD-10-CM

## 2018-09-22 DIAGNOSIS — I6523 Occlusion and stenosis of bilateral carotid arteries: Secondary | ICD-10-CM | POA: Diagnosis not present

## 2018-09-22 DIAGNOSIS — J069 Acute upper respiratory infection, unspecified: Secondary | ICD-10-CM | POA: Insufficient documentation

## 2018-09-22 MED ORDER — DAPAGLIFLOZIN PROPANEDIOL 5 MG PO TABS: 5.0000 mg | ORAL_TABLET | Freq: Every day | ORAL | 11 refills | Status: DC

## 2018-09-22 NOTE — Assessment & Plan Note (Signed)
Simvastatin 

## 2018-09-22 NOTE — Progress Notes (Signed)
Subjective:  Patient ID: Larry [name], male    DOB: 1941/06/10  Age: 78 y.o. MRN: [id number]  CC: No chief complaint on file.   HPI Larry [name] presents for elevated CBG>200 F/u CAD, CRF  Outpatient Medications Prior to Visit  Medication Sig Dispense Refill  . ACCU-CHEK FASTCLIX LANCETS MISC 1 each by Does not apply route 2 (two) times daily. Dx:E11.9 100 each 11  . aspirin EC 81 MG tablet Take 1 tablet (81 mg total) by mouth daily. 90 tablet 3  . Cholecalciferol (VITAMIN D3) 1000 UNITS CAPS Take 1,000 Units by mouth daily.     . furosemide (LASIX) 20 MG tablet Take 1 tablet (20 mg total) by mouth daily. 90 tablet 3  . glimepiride (AMARYL) 2 MG tablet TAKE 1 TABLET BY MOUTH TWICE DAILY 180 tablet 3  . glucose monitoring kit (FREESTYLE) monitoring kit 1 each by Does not apply route as needed for other. Please dispense brand of choice per ins/patient. Dx: 250.00. 1 each 0  . losartan (COZAAR) 100 MG tablet Take 100 mg by mouth daily.    . metFORMIN (GLUCOPHAGE) 1000 MG tablet TAKE 1 TABLET BY MOUTH TWICE DAILY WITH MEALS (Patient taking differently: TAKE 1 TABLET ('1000mg'$ ) BY MOUTH TWICE DAILY WITH MEALS) 180 tablet 3  . metoprolol succinate (TOPROL-XL) 50 MG 24 hr tablet Take 1 tablet (50 mg total) by mouth daily. Take with or immediately following a meal. 90 tablet 3  . Multiple Vitamins-Minerals (MACULAR VITAMIN BENEFIT) TABS Take 1 tablet by mouth 2 (two) times daily.    . nitroGLYCERIN (NITROSTAT) 0.4 MG SL tablet Place 1 tablet (0.4 mg total) under the tongue every 5 (five) minutes as needed for chest pain. 20 tablet 3  . simvastatin (ZOCOR) 40 MG tablet TAKE 1 TABLET BY MOUTH AT BEDTIME (Patient taking differently: TAKE 1 TABLET ('40mg'$ ) BY MOUTH AT BEDTIME) 90 tablet 3  . spironolactone (ALDACTONE) 25 MG tablet Take 1 tablet (25 mg total) by mouth daily. 90 tablet 3   No facility-administered medications prior to visit.     ROS: Review of Systems  Constitutional: Positive  for unexpected weight change. Negative for appetite change and fatigue.  HENT: Negative for congestion, nosebleeds, sneezing, sore throat and trouble swallowing.   Eyes: Negative for itching and visual disturbance.  Respiratory: Negative for cough.   Cardiovascular: Negative for chest pain, palpitations and leg swelling.  Gastrointestinal: Negative for abdominal distention, blood in stool, diarrhea and nausea.  Genitourinary: Negative for frequency and hematuria.  Musculoskeletal: Negative for back pain, gait problem, joint swelling and neck pain.  Skin: Negative for rash.  Neurological: Negative for dizziness, tremors, speech difficulty and weakness.  Psychiatric/Behavioral: Negative for agitation, dysphoric mood and sleep disturbance. The patient is not nervous/anxious.     Objective:  BP 134/68 (BP Location: Left Arm, Patient Position: Sitting, Cuff Size: Large)   Pulse 65   Temp 98 F (36.7 C) (Oral)   Ht 6' (1.829 m)   Wt 246 lb (111.6 kg)   SpO2 97%   BMI 33.36 kg/m   BP Readings from Last 3 Encounters:  09/22/18 134/68  09/11/18 138/64  08/25/18 (!) 136/56    Wt Readings from Last 3 Encounters:  09/22/18 246 lb (111.6 kg)  09/11/18 245 lb 12.8 oz (111.5 kg)  08/25/18 243 lb (110.2 kg)    Physical Exam Constitutional:      General: He is not in acute distress.    Appearance: He is well-developed.  Comments: NAD  Eyes:     Conjunctiva/sclera: Conjunctivae normal.     Pupils: Pupils are equal, round, and reactive to light.  Neck:     Musculoskeletal: Normal range of motion.     Thyroid: No thyromegaly.     Vascular: No JVD.  Cardiovascular:     Rate and Rhythm: Normal rate and regular rhythm.     Heart sounds: Normal heart sounds. No murmur. No friction rub. No gallop.   Pulmonary:     Effort: Pulmonary effort is normal. No respiratory distress.     Breath sounds: Normal breath sounds. No wheezing or rales.  Chest:     Chest wall: No tenderness.    Abdominal:     General: Bowel sounds are normal. There is no distension.     Palpations: Abdomen is soft. There is no mass.     Tenderness: There is no abdominal tenderness. There is no guarding or rebound.  Musculoskeletal: Normal range of motion.        General: No tenderness.  Lymphadenopathy:     Cervical: No cervical adenopathy.  Skin:    General: Skin is warm and dry.     Findings: No rash.  Neurological:     Mental Status: He is alert and oriented to person, place, and time.     Cranial Nerves: No cranial nerve deficit.     Motor: No abnormal muscle tone.     Coordination: Coordination normal.     Gait: Gait normal.     Deep Tendon Reflexes: Reflexes are normal and symmetric.  Psychiatric:        Behavior: Behavior normal.        Thought Content: Thought content normal.        Judgment: Judgment normal.   obese   Lab Results  Component Value Date   WBC 10.1 08/27/2017   HGB 11.6 (L) 08/27/2017   HCT 36.6 (L) 08/27/2017   PLT 291 08/27/2017   GLUCOSE 212 (H) 09/19/2018   CHOL 137 09/19/2018   TRIG 156 (H) 09/19/2018   HDL 39 (L) 09/19/2018   LDLCALC 67 09/19/2018   ALT 19 09/19/2018   AST 14 09/19/2018   NA 140 09/19/2018   K 5.6 (H) 09/19/2018   CL 104 09/19/2018   CREATININE 1.60 (H) 09/19/2018   BUN 36 (H) 09/19/2018   CO2 21 09/19/2018   TSH 2.19 03/25/2018   PSA 0.00 Repeated and verified X2. (L) 12/26/2012   INR 1.01 08/19/2017   HGBA1C 6.9 (H) 03/25/2018    Vas US Carotid  Result Date: 08/25/2018 Carotid Arterial Duplex Study Indications:       Endarterectomy and Previous exam 03/05/2018. Other Factors:     Left endarterectomy 08/22/2017. Comparison Study:  Previous exam 03/05/2018 Performing Technologist: Kathrine Comfort RVT, RDCS  Examination Guidelines: A complete evaluation includes B-mode imaging, spectral Doppler, color Doppler, and power Doppler as needed of all accessible portions of each vessel. Bilateral testing is considered an integral part  of a complete examination. Limited examinations for reoccurring indications may be performed as noted.  Right Carotid Findings: +----------+--------+--------+--------+----------------------+--------+           PSV cm/sEDV cm/sStenosisDescribe              Comments +----------+--------+--------+--------+----------------------+--------+ CCA Prox  63      12                                             +----------+--------+--------+--------+----------------------+--------+  CCA Mid   139     13      <50%    irregular                      +----------+--------+--------+--------+----------------------+--------+ CCA Distal132     22                                             +----------+--------+--------+--------+----------------------+--------+ ICA Prox  150     33      40-59%  irregular and calcific         +----------+--------+--------+--------+----------------------+--------+ ICA Mid   112     35                                             +----------+--------+--------+--------+----------------------+--------+ ICA Distal107     26                                             +----------+--------+--------+--------+----------------------+--------+ ECA       176     15                                             +----------+--------+--------+--------+----------------------+--------+ +----------+--------+-------+----------------+-------------------+           PSV cm/sEDV cmsDescribe        Arm Pressure (mmHG) +----------+--------+-------+----------------+-------------------+ [id number]            Multiphasic, WNL                    +----------+--------+-------+----------------+-------------------+ +---------+--------+--+--------+---------+ VertebralPSV cm/s80EDV cm/sAntegrade +---------+--------+--+--------+---------+  Left Carotid Findings: +----------+--------+--------+--------+---------+--------+           PSV cm/sEDV cm/sStenosisDescribe  Comments +----------+--------+--------+--------+---------+--------+ CCA Prox  153     21                                +----------+--------+--------+--------+---------+--------+ CCA Mid   168     13      <50%    irregular         +----------+--------+--------+--------+---------+--------+ CCA Distal201     20                                +----------+--------+--------+--------+---------+--------+ ICA Prox  338     77      60-79%  smooth            +----------+--------+--------+--------+---------+--------+ ICA Mid   340     85                                +----------+--------+--------+--------+---------+--------+ ICA Distal299     64                                +----------+--------+--------+--------+---------+--------+ ECA       303  0                                 +----------+--------+--------+--------+---------+--------+ +----------+--------+--------+----------------+-------------------+ SubclavianPSV cm/sEDV cm/sDescribe        Arm Pressure (mmHG) +----------+--------+--------+----------------+-------------------+           260             Multiphasic, WNL                    +----------+--------+--------+----------------+-------------------+ +---------+--------+--+--------+---------+ VertebralPSV cm/s70EDV cm/sAntegrade +---------+--------+--+--------+---------+  Summary: Right Carotid: Velocities in the right ICA are consistent with a 40-59%                stenosis. Non-hemodynamically significant plaque <50% noted in                the CCA. Left Carotid: Velocities in the left ICA are consistent with a 60-79% stenosis.               Non-hemodynamically significant plaque noted in the CCA. Normal antegrade flow noted in the vertebral arteries.   . Normal flow hemodynamics were seen in bilateral subclavian arteries. *See table(s) above for measurements and observations.  Electronically signed by Harold Barban MD on 08/25/2018 at 9:34:27  AM.    Final     Assessment & Plan:   There are no diagnoses linked to this encounter.   No orders of the defined types were placed in this encounter.    Follow-up: No follow-ups on file.  Walker Kehr, MD

## 2018-09-22 NOTE — Assessment & Plan Note (Signed)
Worse Add Farxiga 5 mg GFR 48-56

## 2018-09-22 NOTE — Assessment & Plan Note (Deleted)
-  Tamiflu 

## 2018-09-25 ENCOUNTER — Telehealth: Payer: Self-pay

## 2018-09-25 ENCOUNTER — Other Ambulatory Visit: Payer: Medicare Other

## 2018-09-25 DIAGNOSIS — E875 Hyperkalemia: Secondary | ICD-10-CM

## 2018-09-25 LAB — BASIC METABOLIC PANEL
BUN/Creatinine Ratio: 27 — ABNORMAL HIGH (ref 10–24)
BUN: 38 mg/dL — ABNORMAL HIGH (ref 8–27)
CO2: 21 mmol/L (ref 20–29)
Calcium: 10.2 mg/dL (ref 8.6–10.2)
Chloride: 103 mmol/L (ref 96–106)
Creatinine, Ser: 1.43 mg/dL — ABNORMAL HIGH (ref 0.76–1.27)
GFR calc non Af Amer: 47 mL/min/{1.73_m2} — ABNORMAL LOW (ref >59)
GFR, EST AFRICAN AMERICAN: 55 mL/min/{1.73_m2} — AB (ref >59)
Glucose: 178 mg/dL — ABNORMAL HIGH (ref 65–99)
Potassium: 5.6 mmol/L — ABNORMAL HIGH (ref 3.5–5.2)
Sodium: 140 mmol/L (ref 134–144)

## 2018-09-25 NOTE — Telephone Encounter (Signed)
Notes recorded by Frederik Schmidt, RN on 09/25/2018 at 4:58 PM EST Reviewed labs with Dr Tamala Julian and the fact that the Potassium remains at 5.6 since 1/31. Dr Tamala Julian advises to stop Spironolactone and f/u with him in 4-6 weeks and repeat BMET. I had to leave a message with the patient.

## 2018-09-26 NOTE — Telephone Encounter (Signed)
Spoke with wife, DPR on file.  They did receive the message to stop Spironolactone.  Scheduled pt to see Dr. Tamala Julian on 3/13.

## 2018-10-01 ENCOUNTER — Ambulatory Visit: Payer: Medicare Other | Admitting: Internal Medicine

## 2018-10-07 ENCOUNTER — Telehealth: Payer: Self-pay

## 2018-10-07 NOTE — Telephone Encounter (Signed)
Copied from [place] [id number]. Topic: General - Other >> Oct 02, 2018  1:55 PM Windy Kalata wrote: Reason for CRM: Patient would like for Dr. Judeen Hammans nurse to return his call, about a prescription that is costing him 400.00 out of pocket, it is the [place] 5mg . Please advise if he can get something different.  Best call back is [phone]

## 2018-10-08 NOTE — Telephone Encounter (Signed)
What Wilder Glade alternative does his insurance pay for? Thx

## 2018-10-14 NOTE — Telephone Encounter (Signed)
jardiance is a covered Careers information officer

## 2018-10-14 NOTE — Telephone Encounter (Signed)
pls ref to [facility] for help Thx

## 2018-10-14 NOTE — Telephone Encounter (Signed)
Following this patient with Cardiology. Based on his [employer] [place] insurance Jardiance (empagliflozin) is tier 3 and should be covered, however he may still have a large copay depending on his plan detuctible. Also patient is covered under [facility], so if the medication is unaffordable consider [facility] consult to pursue patient assistance programs.   Isaias Sakai, Sherian Rein D PGY1 Pharmacy Resident  10/14/2018      3:57 PM

## 2018-10-15 NOTE — Telephone Encounter (Signed)
Can you send in RX so we can find out cost of jardiance?

## 2018-10-16 MED ORDER — EMPAGLIFLOZIN 10 MG PO TABS: 10.0000 mg | ORAL_TABLET | Freq: Every day | ORAL | 11 refills | Status: DC

## 2018-10-16 NOTE — Telephone Encounter (Signed)
Done. Thx.

## 2018-10-16 NOTE — Addendum Note (Signed)
Addended by: Cassandria Anger on: 10/16/2018 07:43 AM   Modules accepted: Orders

## 2018-10-19 ENCOUNTER — Other Ambulatory Visit: Payer: Self-pay | Admitting: Internal Medicine

## 2018-10-31 ENCOUNTER — Ambulatory Visit (INDEPENDENT_AMBULATORY_CARE_PROVIDER_SITE_OTHER): Payer: Medicare Other | Admitting: Interventional Cardiology

## 2018-10-31 ENCOUNTER — Encounter: Payer: Self-pay | Admitting: Interventional Cardiology

## 2018-10-31 ENCOUNTER — Encounter (INDEPENDENT_AMBULATORY_CARE_PROVIDER_SITE_OTHER): Payer: Self-pay

## 2018-10-31 ENCOUNTER — Other Ambulatory Visit: Payer: Self-pay

## 2018-10-31 VITALS — BP 148/74 | HR 69 | Ht 72.0 in | Wt 252.6 lb

## 2018-10-31 DIAGNOSIS — E875 Hyperkalemia: Secondary | ICD-10-CM | POA: Diagnosis not present

## 2018-10-31 DIAGNOSIS — I6523 Occlusion and stenosis of bilateral carotid arteries: Secondary | ICD-10-CM

## 2018-10-31 DIAGNOSIS — E785 Hyperlipidemia, unspecified: Secondary | ICD-10-CM

## 2018-10-31 DIAGNOSIS — G4733 Obstructive sleep apnea (adult) (pediatric): Secondary | ICD-10-CM | POA: Diagnosis not present

## 2018-10-31 DIAGNOSIS — E1159 Type 2 diabetes mellitus with other circulatory complications: Secondary | ICD-10-CM

## 2018-10-31 DIAGNOSIS — I25709 Atherosclerosis of coronary artery bypass graft(s), unspecified, with unspecified angina pectoris: Secondary | ICD-10-CM

## 2018-10-31 DIAGNOSIS — I10 Essential (primary) hypertension: Secondary | ICD-10-CM

## 2018-10-31 DIAGNOSIS — I5042 Chronic combined systolic (congestive) and diastolic (congestive) heart failure: Secondary | ICD-10-CM | POA: Diagnosis not present

## 2018-10-31 LAB — BASIC METABOLIC PANEL
BUN/Creatinine Ratio: 20 (ref 10–24)
BUN: 27 mg/dL (ref 8–27)
CO2: 24 mmol/L (ref 20–29)
CREATININE: 1.34 mg/dL — AB (ref 0.76–1.27)
Calcium: 10.4 mg/dL — ABNORMAL HIGH (ref 8.6–10.2)
Chloride: 102 mmol/L (ref 96–106)
GFR calc Af Amer: 59 mL/min/{1.73_m2} — ABNORMAL LOW (ref >59)
GFR calc non Af Amer: 51 mL/min/{1.73_m2} — ABNORMAL LOW (ref >59)
GLUCOSE: 152 mg/dL — AB (ref 65–99)
Potassium: 4.9 mmol/L (ref 3.5–5.2)
Sodium: 142 mmol/L (ref 134–144)

## 2018-10-31 MED ORDER — METOPROLOL SUCCINATE ER 25 MG PO TB24: 75.0000 mg | ORAL_TABLET | Freq: Every day | ORAL | 3 refills | Status: DC

## 2018-10-31 NOTE — Progress Notes (Signed)
Cardiology Office Note:    Date:  10/31/2018   ID:  Larry [name], DOB 09/10/1940, MRN [id number]  PCP:  Cassandria Anger, MD  Cardiologist:  Sinclair Grooms, MD   Referring MD: Cassandria Anger, MD   Chief Complaint  Patient presents with  . Coronary Artery Disease  . Congestive Heart Failure    History of Present Illness:    Larry [name] is a 78 y.o. male with a hx of CAD, coronary bypass grafting 2018 (LIMA to LAD, SVG to Ramus Intermediate, SVG to OM, and SVG to RCA), obesity, type 2 diabetes, chronic combined systolic and diastolic heart failure (most recent hospital stay August 25, 2017, carotid artery disease status post left carotid endarterectomy January 2019 and severe obesity.  LVEF 45%.  Overall, [name] is doing well.  He is asymptomatic.  When last seen we made some changes to optimize heart failure management to prevent progressive LV dysfunction.  Adding a mineralocorticoid antagonist led to hyperkalemia.  This medication was discontinued.  He is back today for clinical follow-up and repeat blood work.  He denies orthopnea, PND, syncope, palpitations, angina, and difficulty performing daily activities.  No neurological complaints.  Past Medical History:  Diagnosis Date  . Carotid stenosis, left   . Cataracts, bilateral    removed  . Coronary artery disease   . Hyperlipemia   . Prostate CA ([place])   . Sleep apnea    Dr Ledora Bottcher ; uses CPAP  . Type II or unspecified type diabetes mellitus without mention of complication, not stated as uncontrolled   . Unspecified essential hypertension     Past Surgical History:  Procedure Laterality Date  . APPENDECTOMY    . CARDIAC CATHETERIZATION    . CATARACT EXTRACTION, BILATERAL    . CORONARY ARTERY BYPASS GRAFT N/A 06/27/2017   Procedure: CORONARY ARTERY BYPASS GRAFTING (CABG), time four   using the left internal mammary artery, and right saphenous leg vein harvested endoscopically.;  Surgeon: Ivin Poot, MD;  Location: [place];  Service: Open Heart Surgery;  Laterality: N/A;  . CYSTOSCOPY N/A 06/27/2017   Procedure: CYSTOSCOPY FLEXIBLE, Balloon dilatation placement of foley catheter;  Surgeon: Ivin Poot, MD;  Location: [facility];  Service: Open Heart Surgery;  Laterality: N/A;  . ENDARTERECTOMY Left 08/22/2017   Procedure: ENDARTERECTOMY CAROTID LEFT;  Surgeon: Serafina Mitchell, MD;  Location: [facility];  Service: Vascular;  Laterality: Left;  . INTRAVASCULAR ULTRASOUND/IVUS N/A 06/24/2017   Procedure: Intravascular Ultrasound/IVUS;  Surgeon: Belva Crome, MD;  Location: [place] [facility];  Service: Cardiovascular;  Laterality: N/A;  LEFT MAIN  . LEFT HEART CATH AND CORONARY ANGIOGRAPHY N/A 06/24/2017   Procedure: LEFT HEART CATH AND CORONARY ANGIOGRAPHY;  Surgeon: Belva Crome, MD;  Location: [place] [facility];  Service: Cardiovascular;  Laterality: N/A;  . PATCH ANGIOPLASTY Left 08/22/2017   Procedure: PATCH ANGIOPLASTY USING Rueben Bash BIOLOGIC PATCH;  Surgeon: Serafina Mitchell, MD;  Location: [place];  Service: Vascular;  Laterality: Left;  . PROSTATECTOMY    . TEE WITHOUT CARDIOVERSION N/A 06/27/2017   Procedure: TRANSESOPHAGEAL ECHOCARDIOGRAM (TEE);  Surgeon: Prescott Gum, Collier Salina, MD;  Location: [facility];  Service: Open Heart Surgery;  Laterality: N/A;  . TONSILLECTOMY    . ULTRASOUND GUIDANCE FOR VASCULAR ACCESS  06/24/2017   Procedure: Ultrasound Guidance For Vascular Access;  Surgeon: Belva Crome, MD;  Location: [place] [facility];  Service: Cardiovascular;;    Current Medications: Current  Meds  Medication Sig  . ACCU-CHEK FASTCLIX LANCETS MISC 1 each by Does not apply route 2 (two) times daily. Dx:E11.9  . aspirin EC 81 MG tablet Take 1 tablet (81 mg total) by mouth daily.  . Cholecalciferol (VITAMIN D3) 1000 UNITS CAPS Take 1,000 Units by mouth daily.   . empagliflozin (JARDIANCE) 10 MG TABS tablet Take 10 mg by mouth daily.  . furosemide (LASIX) 20 MG tablet Take 1 tablet (20 mg total)  by mouth daily.  [name] glimepiride (AMARYL) 2 MG tablet TAKE 1 TABLET BY MOUTH TWICE DAILY  . glucose monitoring kit (FREESTYLE) monitoring kit 1 each by Does not apply route as needed for other. Please dispense brand of choice per ins/patient. Dx: 250.00.  [name] losartan (COZAAR) 100 MG tablet Take 1 tablet by mouth once daily  . metFORMIN (GLUCOPHAGE) 1000 MG tablet Take 1,000 mg by mouth 2 (two) times daily with a meal.  . Multiple Vitamins-Minerals (MACULAR VITAMIN BENEFIT) TABS Take 1 tablet by mouth 2 (two) times daily.  . nitroGLYCERIN (NITROSTAT) 0.4 MG SL tablet Place 1 tablet (0.4 mg total) under the tongue every 5 (five) minutes as needed for chest pain.  . simvastatin (ZOCOR) 40 MG tablet Take 40 mg by mouth daily.  . [DISCONTINUED] metoprolol succinate (TOPROL-XL) 50 MG 24 hr tablet Take 1 tablet (50 mg total) by mouth daily. Take with or immediately following a meal.     Allergies:   Amlodipine; Verapamil; Clindamycin hcl; and Contrast media [iodinated diagnostic agents]   Social History   Socioeconomic History  . Marital status: Married    Spouse name: Not on file  . Number of children: Not on file  . Years of education: Not on file  . Highest education level: Bachelor's degree (e.g., BA, AB, BS)  Occupational History  . Occupation: Teaching laboratory technician  . Financial resource strain: Not hard at all  . Food insecurity:    Worry: Never true    Inability: Never true  . Transportation needs:    Medical: Not on file    Non-medical: Not on file  Tobacco Use  . Smoking status: Never Smoker  . Smokeless tobacco: Never Used  Substance and Sexual Activity  . Alcohol use: No    Alcohol/week: 0.0 standard drinks  . Drug use: No  . Sexual activity: Yes  Lifestyle  . Physical activity:    Days per week: 0 days    Minutes per session: 0 min  . Stress: Not at all  Relationships  . Social connections:    Talks on phone: Not on file    Gets together: Not on file    Attends  religious service: Not on file    Active member of club or organization: Not on file    Attends meetings of clubs or organizations: Not on file    Relationship status: Not on file  Other Topics Concern  . Not on file  Social History Narrative   Regular Exercise- no, golf     Family History: The patient's family history includes Diabetes in an other family member; Heart disease in his mother; Hypertension in an other family member. There is no history of Colon cancer.  ROS:   Please see the history of present illness.    No complaints other than bilateral lower extremity swelling all other systems reviewed and are negative.  EKGs/Labs/Other Studies Reviewed:    The following studies were reviewed today: Doppler echocardiogram February 2019:  Study Conclusions  -  Left ventricle: The cavity size was normal. There was moderate   concentric hypertrophy. Systolic function was mildly reduced. The   estimated ejection fraction was in the range of 45% to 50%. There   is hypokinesis in the basal and mid anteroseptal, anterior and   apical septal walls. Doppler parameters are consistent with   abnormal left ventricular relaxation (grade 1 diastolic   dysfunction). There was no evidence of elevated ventricular   filling pressure by Doppler parameters. - Aortic valve: There was no regurgitation. - Mitral valve: Calcified annulus. Mildly thickened leaflets .   There was trivial regurgitation. - Left atrium: The atrium was moderately dilated. - Right ventricle: Systolic function was normal. - Right atrium: The atrium was normal in size. - Tricuspid valve: There was trivial regurgitation. - Pulmonary arteries: Systolic pressure was within the normal   range. - Pericardium, extracardiac: There was no pericardial effusion.  Impressions:  - Since the last exam on 06/26/2017 LVEF has mildly improved from   40-45% to 45-50%.   EKG:  EKG not repeated  Recent Labs: 03/25/2018: TSH 2.19  09/19/2018: ALT 19 09/25/2018: BUN 38; Creatinine, Ser 1.43; Potassium 5.6; Sodium 140  Recent Lipid Panel    Component Value Date/Time   CHOL 137 09/19/2018 0743   TRIG 156 (H) 09/19/2018 0743   HDL 39 (L) 09/19/2018 0743   CHOLHDL 3.5 09/19/2018 0743   CHOLHDL 3 06/18/2017 0847   VLDL 21.0 06/18/2017 0847   LDLCALC 67 09/19/2018 0743    Physical Exam:    VS:  BP (!) 148/74   Pulse 69   Ht 6' (1.829 m)   Wt 252 lb 9.6 oz (114.6 kg)   SpO2 97%   BMI 34.26 kg/m     Wt Readings from Last 3 Encounters:  10/31/18 252 lb 9.6 oz (114.6 kg)  09/22/18 246 lb (111.6 kg)  09/11/18 245 lb 12.8 oz (111.5 kg)     GEN: Bees. No acute distress HEENT: Normal NECK: No JVD. LYMPHATICS: No lymphadenopathy CARDIAC: RRR.  No murmur, no gallop, no edema VASCULAR: 2+ bilateral radial and carotid pulses, no bruits RESPIRATORY:  Clear to auscultation without rales, wheezing or rhonchi  ABDOMEN: Soft, non-tender, non-distended, No pulsatile mass, MUSCULOSKELETAL: No deformity  SKIN: Warm and dry NEUROLOGIC:  Alert and oriented x 3 PSYCHIATRIC:  Normal affect   ASSESSMENT:    1. Coronary artery disease involving coronary bypass graft of native heart with angina pectoris ([place])   2. Chronic combined systolic and diastolic heart failure ([place])   3. Essential hypertension   4. Hyperkalemia   5. OBSTRUCTIVE SLEEP APNEA   6. Dyslipidemia   7. Type 2 diabetes mellitus with other circulatory complication, without long-term current use of insulin ([facility])    PLAN:    In order of problems listed above:  1. Stable without angina.  We discussed secondary prevention on the last office visit in January. 2. No volume overload/pulmonary congestion.  Neck veins are flat.  He does have bilateral ankle edema.  He is on 20 mg of furosemide.  Encouraged to monitor the lower extremity swelling.  He developed significant hyperkalemia after adding low-dose Spironolactone.   B- met will be checked today. 3. Blood  pressure is mildly elevated 148/74 mmHg.  Increase Toprol-XL to 75 mg/day.  May eventually need 100 mg/day if blood pressure remains elevated. 4. Last potassium was 5.6.  It is being rechecked today. 5. Courage compliance with CPAP. 6. LDL target less than 70. 7. Agree  with the use of Jardiance.  Wilder Glade may have better data (dapagliflozin) for cardioprotection.  Clinical follow-up in 1 year.  Monitor blood pressure.  If remains above 130/80 mmHg on the current regimen, should further increase beta-blocker as tolerated by heart rate.   Medication Adjustments/Labs and Tests Ordered: Current medicines are reviewed at length with the patient today.  Concerns regarding medicines are outlined above.  Orders Placed This Encounter  Procedures  . Basic metabolic panel   Meds ordered this encounter  Medications  . metoprolol succinate (TOPROL-XL) 25 MG 24 hr tablet    Sig: Take 3 tablets (75 mg total) by mouth daily. Take with or immediately following a meal.    Dispense:  270 tablet    Refill:  3    Dose change    Patient Instructions  Medication Instructions:  1) INCREASE Metoprolol Succinate to 59m once daily  If you need a refill on your cardiac medications before your next appointment, please call your pharmacy.   Lab work: BMET today If you have labs (blood work) drawn today and your tests are completely normal, you will receive your results only by: .Marland KitchenMyChart Message (if you have MyChart) OR . A paper copy in the mail If you have any lab test that is abnormal or we need to change your treatment, we will call you to review the results.  Testing/Procedures: None  Follow-Up: [facility] and your health needs are our priority.  As part of our continuing mission to provide you with exceptional heart care, we have created designated Provider Care Teams.  These Care Teams include your primary Cardiologist (physician) and Advanced Practice Providers (APPs -  Physician  Assistants and Nurse Practitioners) who all work together to provide you with the care you need, when you need it. You will need a follow up appointment in 12 months.  Please call our office 2 months in advance to schedule this appointment.  You may see HSinclair Grooms MD or one of the following Advanced Practice Providers on your designated Care Team:   LTruitt Merle NP LCecilie Kicks NP . JKathyrn Drown NP  Any Other Special Instructions Will Be Listed Below (If Applicable).       Signed, HSinclair Grooms MD  10/31/2018 10:22 AM    [place]

## 2018-10-31 NOTE — Patient Instructions (Signed)
Medication Instructions:  1) INCREASE Metoprolol Succinate to 75mg  once daily  If you need a refill on your cardiac medications before your next appointment, please call your pharmacy.   Lab work: BMET today If you have labs (blood work) drawn today and your tests are completely normal, you will receive your results only by: [name] MyChart Message (if you have MyChart) OR . A paper copy in the mail If you have any lab test that is abnormal or we need to change your treatment, we will call you to review the results.  Testing/Procedures: None  Follow-Up: At [facility], you and your health needs are our priority.  As part of our continuing mission to provide you with exceptional heart care, we have created designated Provider Care Teams.  These Care Teams include your primary Cardiologist (physician) and Advanced Practice Providers (APPs -  Physician Assistants and Nurse Practitioners) who all work together to provide you with the care you need, when you need it. You will need a follow up appointment in 12 months.  Please call our office 2 months in advance to schedule this appointment.  You may see Sinclair Grooms, MD or one of the following Advanced Practice Providers on your designated Care Team:   Truitt Merle, NP Cecilie Kicks, NP . Kathyrn Drown, NP  Any Other Special Instructions Will Be Listed Below (If Applicable).

## 2018-11-04 ENCOUNTER — Other Ambulatory Visit: Payer: Self-pay

## 2018-11-04 ENCOUNTER — Encounter: Payer: Self-pay | Admitting: Internal Medicine

## 2018-11-04 ENCOUNTER — Ambulatory Visit (INDEPENDENT_AMBULATORY_CARE_PROVIDER_SITE_OTHER): Payer: Medicare Other | Admitting: Internal Medicine

## 2018-11-04 DIAGNOSIS — E1159 Type 2 diabetes mellitus with other circulatory complications: Secondary | ICD-10-CM | POA: Diagnosis not present

## 2018-11-04 DIAGNOSIS — E785 Hyperlipidemia, unspecified: Secondary | ICD-10-CM

## 2018-11-04 DIAGNOSIS — I10 Essential (primary) hypertension: Secondary | ICD-10-CM | POA: Diagnosis not present

## 2018-11-04 DIAGNOSIS — I25709 Atherosclerosis of coronary artery bypass graft(s), unspecified, with unspecified angina pectoris: Secondary | ICD-10-CM | POA: Diagnosis not present

## 2018-11-04 DIAGNOSIS — I6523 Occlusion and stenosis of bilateral carotid arteries: Secondary | ICD-10-CM

## 2018-11-04 LAB — POCT GLYCOSYLATED HEMOGLOBIN (HGB A1C): Hemoglobin A1C: 6.5 % — AB (ref 4.0–5.6)

## 2018-11-04 NOTE — Assessment & Plan Note (Addendum)
On Metformin, Glimepiride   Farxiga 5 mg - d/c $600 GFR 48-56

## 2018-11-04 NOTE — Assessment & Plan Note (Signed)
Simvastatin 

## 2018-11-04 NOTE — Patient Instructions (Signed)
Hgb A1c 6.5% - great

## 2018-11-04 NOTE — Addendum Note (Signed)
Addended by: Karren Cobble on: 11/04/2018 09:47 AM   Modules accepted: Orders

## 2018-11-04 NOTE — Progress Notes (Signed)
Subjective:  Patient ID: Larry [name], male    DOB: 1941/08/04  Age: 78 y.o. MRN: [id number]  CC: No chief complaint on file.   HPI Larry [name] presents for DM, CAD, HTN f/u  Outpatient Medications Prior to Visit  Medication Sig Dispense Refill  . ACCU-CHEK FASTCLIX LANCETS MISC 1 each by Does not apply route 2 (two) times daily. Dx:E11.9 100 each 11  . aspirin EC 81 MG tablet Take 1 tablet (81 mg total) by mouth daily. 90 tablet 3  . Cholecalciferol (VITAMIN D3) 1000 UNITS CAPS Take 1,000 Units by mouth daily.     . empagliflozin (JARDIANCE) 10 MG TABS tablet Take 10 mg by mouth daily. 30 tablet 11  . furosemide (LASIX) 20 MG tablet Take 1 tablet (20 mg total) by mouth daily. 90 tablet 3  . glimepiride (AMARYL) 2 MG tablet TAKE 1 TABLET BY MOUTH TWICE DAILY 180 tablet 3  . glucose monitoring kit (FREESTYLE) monitoring kit 1 each by Does not apply route as needed for other. Please dispense brand of choice per ins/patient. Dx: 250.00. 1 each 0  . losartan (COZAAR) 100 MG tablet Take 1 tablet by mouth once daily 90 tablet 3  . metFORMIN (GLUCOPHAGE) 1000 MG tablet Take 1,000 mg by mouth 2 (two) times daily with a meal.    . metoprolol succinate (TOPROL-XL) 25 MG 24 hr tablet Take 3 tablets (75 mg total) by mouth daily. Take with or immediately following a meal. 270 tablet 3  . Multiple Vitamins-Minerals (MACULAR VITAMIN BENEFIT) TABS Take 1 tablet by mouth 2 (two) times daily.    . nitroGLYCERIN (NITROSTAT) 0.4 MG SL tablet Place 1 tablet (0.4 mg total) under the tongue every 5 (five) minutes as needed for chest pain. 20 tablet 3  . simvastatin (ZOCOR) 40 MG tablet Take 40 mg by mouth daily.     No facility-administered medications prior to visit.     ROS: Review of Systems  Constitutional: Negative for appetite change, fatigue and unexpected weight change.  HENT: Negative for congestion, nosebleeds, sneezing, sore throat and trouble swallowing.   Eyes: Negative for itching and  visual disturbance.  Respiratory: Negative for cough.   Cardiovascular: Negative for chest pain, palpitations and leg swelling.  Gastrointestinal: Negative for abdominal distention, blood in stool, diarrhea and nausea.  Genitourinary: Negative for frequency and hematuria.  Musculoskeletal: Negative for back pain, gait problem, joint swelling and neck pain.  Skin: Negative for rash.  Neurological: Negative for dizziness, tremors, speech difficulty and weakness.  Psychiatric/Behavioral: Negative for agitation, dysphoric mood, sleep disturbance and suicidal ideas. The patient is not nervous/anxious.     Objective:  BP (!) 152/74 (BP Location: Left Arm, Patient Position: Sitting, Cuff Size: Large)   Pulse 65   Temp 97.6 F (36.4 C) (Oral)   Ht 6' (1.829 m)   Wt 251 lb (113.9 kg)   SpO2 96%   BMI 34.04 kg/m   BP Readings from Last 3 Encounters:  11/04/18 (!) 152/74  10/31/18 (!) 148/74  09/22/18 134/68    Wt Readings from Last 3 Encounters:  11/04/18 251 lb (113.9 kg)  10/31/18 252 lb 9.6 oz (114.6 kg)  09/22/18 246 lb (111.6 kg)    Physical Exam Constitutional:      General: He is not in acute distress.    Appearance: He is well-developed.     Comments: NAD  Eyes:     Conjunctiva/sclera: Conjunctivae normal.     Pupils: Pupils are equal, round,  and reactive to light.  Neck:     Musculoskeletal: Normal range of motion.     Thyroid: No thyromegaly.     Vascular: No JVD.  Cardiovascular:     Rate and Rhythm: Normal rate and regular rhythm.     Heart sounds: Normal heart sounds. No murmur. No friction rub. No gallop.   Pulmonary:     Effort: Pulmonary effort is normal. No respiratory distress.     Breath sounds: Normal breath sounds. No wheezing or rales.  Chest:     Chest wall: No tenderness.  Abdominal:     General: Bowel sounds are normal. There is no distension.     Palpations: Abdomen is soft. There is no mass.     Tenderness: There is no abdominal tenderness.  There is no guarding or rebound.  Musculoskeletal: Normal range of motion.        General: No tenderness.  Lymphadenopathy:     Cervical: No cervical adenopathy.  Skin:    General: Skin is warm and dry.     Findings: No rash.  Neurological:     Mental Status: He is alert and oriented to person, place, and time.     Cranial Nerves: No cranial nerve deficit.     Motor: No abnormal muscle tone.     Coordination: Coordination normal.     Gait: Gait normal.     Deep Tendon Reflexes: Reflexes are normal and symmetric.  Psychiatric:        Behavior: Behavior normal.        Thought Content: Thought content normal.        Judgment: Judgment normal.   obese  Lab Results  Component Value Date   WBC 10.1 08/27/2017   HGB 11.6 (L) 08/27/2017   HCT 36.6 (L) 08/27/2017   PLT 291 08/27/2017   GLUCOSE 152 (H) 10/31/2018   CHOL 137 09/19/2018   TRIG 156 (H) 09/19/2018   HDL 39 (L) 09/19/2018   LDLCALC 67 09/19/2018   ALT 19 09/19/2018   AST 14 09/19/2018   NA 142 10/31/2018   K 4.9 10/31/2018   CL 102 10/31/2018   CREATININE 1.34 (H) 10/31/2018   BUN 27 10/31/2018   CO2 24 10/31/2018   TSH 2.19 03/25/2018   PSA 0.00 Repeated and verified X2. (L) 12/26/2012   INR 1.01 08/19/2017   HGBA1C 6.9 (H) 03/25/2018    Vas US Carotid  Result Date: 08/25/2018 Carotid Arterial Duplex Study Indications:       Endarterectomy and Previous exam 03/05/2018. Other Factors:     Left endarterectomy 08/22/2017. Comparison Study:  Previous exam 03/05/2018 Performing Technologist: Kathrine Comfort RVT, RDCS  Examination Guidelines: A complete evaluation includes B-mode imaging, spectral Doppler, color Doppler, and power Doppler as needed of all accessible portions of each vessel. Bilateral testing is considered an integral part of a complete examination. Limited examinations for reoccurring indications may be performed as noted.  Right Carotid Findings:  +----------+--------+--------+--------+----------------------+--------+           PSV cm/sEDV cm/sStenosisDescribe              Comments +----------+--------+--------+--------+----------------------+--------+ CCA Prox  63      12                                             +----------+--------+--------+--------+----------------------+--------+ CCA Mid   139  13      <50%    irregular                      +----------+--------+--------+--------+----------------------+--------+ CCA Distal132     22                                             +----------+--------+--------+--------+----------------------+--------+ ICA Prox  150     33      40-59%  irregular and calcific         +----------+--------+--------+--------+----------------------+--------+ ICA Mid   112     35                                             +----------+--------+--------+--------+----------------------+--------+ ICA Distal107     26                                             +----------+--------+--------+--------+----------------------+--------+ ECA       176     15                                             +----------+--------+--------+--------+----------------------+--------+ +----------+--------+-------+----------------+-------------------+           PSV cm/sEDV cmsDescribe        Arm Pressure (mmHG) +----------+--------+-------+----------------+-------------------+ [id number]            Multiphasic, WNL                    +----------+--------+-------+----------------+-------------------+ +---------+--------+--+--------+---------+ VertebralPSV cm/s80EDV cm/sAntegrade +---------+--------+--+--------+---------+  Left Carotid Findings: +----------+--------+--------+--------+---------+--------+           PSV cm/sEDV cm/sStenosisDescribe Comments +----------+--------+--------+--------+---------+--------+ CCA Prox  153     21                                 +----------+--------+--------+--------+---------+--------+ CCA Mid   168     13      <50%    irregular         +----------+--------+--------+--------+---------+--------+ CCA Distal201     20                                +----------+--------+--------+--------+---------+--------+ ICA Prox  338     77      60-79%  smooth            +----------+--------+--------+--------+---------+--------+ ICA Mid   340     85                                +----------+--------+--------+--------+---------+--------+ ICA Distal299     64                                +----------+--------+--------+--------+---------+--------+ ECA       303     0                                 +----------+--------+--------+--------+---------+--------+ +----------+--------+--------+----------------+-------------------+  SubclavianPSV cm/sEDV cm/sDescribe        Arm Pressure (mmHG) +----------+--------+--------+----------------+-------------------+           260             Multiphasic, WNL                    +----------+--------+--------+----------------+-------------------+ +---------+--------+--+--------+---------+ VertebralPSV cm/s70EDV cm/sAntegrade +---------+--------+--+--------+---------+  Summary: Right Carotid: Velocities in the right ICA are consistent with a 40-59%                stenosis. Non-hemodynamically significant plaque <50% noted in                the CCA. Left Carotid: Velocities in the left ICA are consistent with a 60-79% stenosis.               Non-hemodynamically significant plaque noted in the CCA. Normal antegrade flow noted in the vertebral arteries.   . Normal flow hemodynamics were seen in bilateral subclavian arteries. *See table(s) above for measurements and observations.  Electronically signed by Harold Barban MD on 08/25/2018 at 9:34:27 AM.    Final     Assessment & Plan:   There are no diagnoses linked to this encounter.   No orders of the defined types  were placed in this encounter.    Follow-up: No follow-ups on file.  Walker Kehr, MD

## 2018-11-04 NOTE — Assessment & Plan Note (Signed)
No CP 

## 2018-11-04 NOTE — Assessment & Plan Note (Addendum)
Aldactone Furosemide Toprol was increased

## 2018-11-10 DIAGNOSIS — L570 Actinic keratosis: Secondary | ICD-10-CM | POA: Diagnosis not present

## 2018-11-10 DIAGNOSIS — Z85828 Personal history of other malignant neoplasm of skin: Secondary | ICD-10-CM | POA: Diagnosis not present

## 2018-11-10 DIAGNOSIS — L821 Other seborrheic keratosis: Secondary | ICD-10-CM | POA: Diagnosis not present

## 2018-11-10 DIAGNOSIS — Z08 Encounter for follow-up examination after completed treatment for malignant neoplasm: Secondary | ICD-10-CM | POA: Diagnosis not present

## 2018-11-11 ENCOUNTER — Ambulatory Visit: Payer: Self-pay | Admitting: Internal Medicine

## 2018-11-11 NOTE — Telephone Encounter (Signed)
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 He was in direct contact with him last week for 1-2 hours. He has no symptoms. Will self quarantine x 14 days and will call back if develops symptoms. Instructed to go to ED for shortness of breath.Verbalizes understanding. Reason for Disposition . [1] COVID-19 EXPOSURE (Close Contact) AND [2] 15 or more days ago AND [3] NO cough or fever or breathing difficulty  Answer Assessment - Initial Assessment Questions 1. CONFIRMED CASE: "Who is the person with the confirmed COVID-19 infection that you were exposed to?"     Boss 2. PLACE of CONTACT: "Where were you when you were exposed to COVID-19  (coronavirus disease 2019)?" (e.g., city, state, country)     [place] 3. TYPE of CONTACT: "How much contact was there?" (e.g., live in same house, work in same office, same school)     Work 4. DATE of CONTACT: "When did you have contact with a coronavirus patient?" (e.g., days)     Last week 5. DURATION of CONTACT: "How long were you in contact with the COVID-19 (coronavirus disease) patient?" (e.g., a few seconds, passed by person, a few minutes, live with the patient)     1-2 hours 6. SYMPTOMS: "Do you have any symptoms?" (e.g., fever, cough, breathing difficulty)     No symptoms 7. PREGNANCY OR POSTPARTUM: "Is there any chance you are pregnant?" "When was your last menstrual period?" "Did you deliver in the last 2 weeks?"     n/a 8. HIGH RISK: "Do you have any heart or lung problems? Do you have a weakened immune system?" (e.g., CHF, COPD, asthma, HIV positive, chemotherapy, renal failure, diabetes mellitus, sickle cell anemia)     Open heart surgery, Diabetic  Protocols used: CORONAVIRUS (COVID-19) EXPOSURE-A-AH

## 2018-11-11 NOTE — Telephone Encounter (Signed)
FYI

## 2018-11-19 ENCOUNTER — Emergency Department (HOSPITAL_COMMUNITY): Payer: Medicare Other

## 2018-11-19 ENCOUNTER — Encounter (HOSPITAL_COMMUNITY): Payer: Self-pay | Admitting: Emergency Medicine

## 2018-11-19 ENCOUNTER — Other Ambulatory Visit: Payer: Self-pay

## 2018-11-19 ENCOUNTER — Inpatient Hospital Stay (HOSPITAL_COMMUNITY): Payer: Medicare Other

## 2018-11-19 ENCOUNTER — Inpatient Hospital Stay (HOSPITAL_COMMUNITY)
Admission: EM | Admit: 2018-11-19 | Discharge: 2018-11-24 | DRG: 871 | Disposition: A | Discharge status: Home / Self Care | Payer: Medicare Other | Attending: Internal Medicine | Admitting: Internal Medicine

## 2018-11-19 DIAGNOSIS — J9690 Respiratory failure, unspecified, unspecified whether with hypoxia or hypercapnia: Secondary | ICD-10-CM | POA: Diagnosis not present

## 2018-11-19 DIAGNOSIS — E1122 Type 2 diabetes mellitus with diabetic chronic kidney disease: Secondary | ICD-10-CM | POA: Diagnosis present

## 2018-11-19 DIAGNOSIS — Z4682 Encounter for fitting and adjustment of non-vascular catheter: Secondary | ICD-10-CM | POA: Diagnosis not present

## 2018-11-19 DIAGNOSIS — I6522 Occlusion and stenosis of left carotid artery: Secondary | ICD-10-CM | POA: Diagnosis not present

## 2018-11-19 DIAGNOSIS — Z833 Family history of diabetes mellitus: Secondary | ICD-10-CM

## 2018-11-19 DIAGNOSIS — R6520 Severe sepsis without septic shock: Secondary | ICD-10-CM | POA: Diagnosis not present

## 2018-11-19 DIAGNOSIS — E785 Hyperlipidemia, unspecified: Secondary | ICD-10-CM | POA: Diagnosis present

## 2018-11-19 DIAGNOSIS — F10239 Alcohol dependence with withdrawal, unspecified: Secondary | ICD-10-CM | POA: Diagnosis not present

## 2018-11-19 DIAGNOSIS — I6523 Occlusion and stenosis of bilateral carotid arteries: Secondary | ICD-10-CM | POA: Diagnosis present

## 2018-11-19 DIAGNOSIS — N189 Chronic kidney disease, unspecified: Secondary | ICD-10-CM | POA: Diagnosis present

## 2018-11-19 DIAGNOSIS — Z20822 Contact with and (suspected) exposure to covid-19: Secondary | ICD-10-CM | POA: Diagnosis present

## 2018-11-19 DIAGNOSIS — Z9289 Personal history of other medical treatment: Secondary | ICD-10-CM

## 2018-11-19 DIAGNOSIS — R29810 Facial weakness: Secondary | ICD-10-CM | POA: Diagnosis not present

## 2018-11-19 DIAGNOSIS — E872 Acidosis: Secondary | ICD-10-CM | POA: Diagnosis not present

## 2018-11-19 DIAGNOSIS — B999 Unspecified infectious disease: Secondary | ICD-10-CM | POA: Diagnosis not present

## 2018-11-19 DIAGNOSIS — A419 Sepsis, unspecified organism: Secondary | ICD-10-CM | POA: Diagnosis not present

## 2018-11-19 DIAGNOSIS — Z7984 Long term (current) use of oral hypoglycemic drugs: Secondary | ICD-10-CM | POA: Diagnosis not present

## 2018-11-19 DIAGNOSIS — Z4659 Encounter for fitting and adjustment of other gastrointestinal appliance and device: Secondary | ICD-10-CM | POA: Diagnosis not present

## 2018-11-19 DIAGNOSIS — Z978 Presence of other specified devices: Secondary | ICD-10-CM

## 2018-11-19 DIAGNOSIS — J9601 Acute respiratory failure with hypoxia: Secondary | ICD-10-CM | POA: Diagnosis present

## 2018-11-19 DIAGNOSIS — Z79899 Other long term (current) drug therapy: Secondary | ICD-10-CM | POA: Diagnosis not present

## 2018-11-19 DIAGNOSIS — G92 Toxic encephalopathy: Secondary | ICD-10-CM | POA: Diagnosis present

## 2018-11-19 DIAGNOSIS — R0902 Hypoxemia: Secondary | ICD-10-CM

## 2018-11-19 DIAGNOSIS — A4189 Other specified sepsis: Secondary | ICD-10-CM | POA: Diagnosis not present

## 2018-11-19 DIAGNOSIS — Z9911 Dependence on respirator [ventilator] status: Secondary | ICD-10-CM | POA: Diagnosis not present

## 2018-11-19 DIAGNOSIS — T85598A Other mechanical complication of other gastrointestinal prosthetic devices, implants and grafts, initial encounter: Secondary | ICD-10-CM

## 2018-11-19 DIAGNOSIS — J9621 Acute and chronic respiratory failure with hypoxia: Secondary | ICD-10-CM | POA: Diagnosis present

## 2018-11-19 DIAGNOSIS — Z781 Physical restraint status: Secondary | ICD-10-CM

## 2018-11-19 DIAGNOSIS — Z951 Presence of aortocoronary bypass graft: Secondary | ICD-10-CM | POA: Diagnosis not present

## 2018-11-19 DIAGNOSIS — Z6833 Body mass index (BMI) 33.0-33.9, adult: Secondary | ICD-10-CM

## 2018-11-19 DIAGNOSIS — I639 Cerebral infarction, unspecified: Secondary | ICD-10-CM

## 2018-11-19 DIAGNOSIS — R4182 Altered mental status, unspecified: Secondary | ICD-10-CM

## 2018-11-19 DIAGNOSIS — G9340 Encephalopathy, unspecified: Secondary | ICD-10-CM | POA: Diagnosis not present

## 2018-11-19 DIAGNOSIS — G4733 Obstructive sleep apnea (adult) (pediatric): Secondary | ICD-10-CM | POA: Diagnosis present

## 2018-11-19 DIAGNOSIS — N179 Acute kidney failure, unspecified: Secondary | ICD-10-CM | POA: Diagnosis not present

## 2018-11-19 DIAGNOSIS — Z20828 Contact with and (suspected) exposure to other viral communicable diseases: Secondary | ICD-10-CM | POA: Diagnosis not present

## 2018-11-19 DIAGNOSIS — I6340 Cerebral infarction due to embolism of unspecified cerebral artery: Secondary | ICD-10-CM

## 2018-11-19 DIAGNOSIS — Z7982 Long term (current) use of aspirin: Secondary | ICD-10-CM | POA: Diagnosis not present

## 2018-11-19 DIAGNOSIS — Z8249 Family history of ischemic heart disease and other diseases of the circulatory system: Secondary | ICD-10-CM

## 2018-11-19 DIAGNOSIS — J1289 Other viral pneumonia: Secondary | ICD-10-CM | POA: Diagnosis present

## 2018-11-19 DIAGNOSIS — R05 Cough: Secondary | ICD-10-CM | POA: Diagnosis not present

## 2018-11-19 DIAGNOSIS — Z789 Other specified health status: Secondary | ICD-10-CM

## 2018-11-19 DIAGNOSIS — E669 Obesity, unspecified: Secondary | ICD-10-CM | POA: Diagnosis present

## 2018-11-19 DIAGNOSIS — Z91041 Radiographic dye allergy status: Secondary | ICD-10-CM

## 2018-11-19 DIAGNOSIS — Z8546 Personal history of malignant neoplasm of prostate: Secondary | ICD-10-CM | POA: Diagnosis not present

## 2018-11-19 DIAGNOSIS — I2510 Atherosclerotic heart disease of native coronary artery without angina pectoris: Secondary | ICD-10-CM | POA: Diagnosis present

## 2018-11-19 DIAGNOSIS — G43109 Migraine with aura, not intractable, without status migrainosus: Secondary | ICD-10-CM

## 2018-11-19 DIAGNOSIS — Z881 Allergy status to other antibiotic agents status: Secondary | ICD-10-CM

## 2018-11-19 DIAGNOSIS — Z888 Allergy status to other drugs, medicaments and biological substances status: Secondary | ICD-10-CM

## 2018-11-19 DIAGNOSIS — R000 Tachycardia, unspecified: Secondary | ICD-10-CM | POA: Diagnosis not present

## 2018-11-19 DIAGNOSIS — I129 Hypertensive chronic kidney disease with stage 1 through stage 4 chronic kidney disease, or unspecified chronic kidney disease: Secondary | ICD-10-CM | POA: Diagnosis present

## 2018-11-19 LAB — URINALYSIS, MICROSCOPIC (REFLEX): WBC, UA: NONE SEEN WBC/hpf (ref 0–5)

## 2018-11-19 LAB — COMPREHENSIVE METABOLIC PANEL
ALT: 23 U/L (ref 0–44)
AST: 26 U/L (ref 15–41)
Albumin: 3.9 g/dL (ref 3.5–5.0)
Alkaline Phosphatase: 79 U/L (ref 38–126)
Anion gap: 15 (ref 5–15)
BUN: 21 mg/dL (ref 8–23)
CO2: 22 mmol/L (ref 22–32)
Calcium: 9.6 mg/dL (ref 8.9–10.3)
Chloride: 100 mmol/L (ref 98–111)
Creatinine, Ser: 1.64 mg/dL — ABNORMAL HIGH (ref 0.61–1.24)
GFR calc Af Amer: 46 mL/min — ABNORMAL LOW (ref >60)
GFR calc non Af Amer: 40 mL/min — ABNORMAL LOW (ref >60)
Glucose, Bld: 325 mg/dL — ABNORMAL HIGH (ref 70–99)
Potassium: 4.6 mmol/L (ref 3.5–5.1)
Sodium: 137 mmol/L (ref 135–145)
Total Bilirubin: 1.1 mg/dL (ref 0.3–1.2)
Total Protein: 7.3 g/dL (ref 6.5–8.1)

## 2018-11-19 LAB — DIFFERENTIAL
Abs Immature Granulocytes: 0.09 10*3/uL — ABNORMAL HIGH (ref 0.00–0.07)
Basophils Absolute: 0.1 10*3/uL (ref 0.0–0.1)
Basophils Relative: 1 %
Eosinophils Absolute: 0 10*3/uL (ref 0.0–0.5)
Eosinophils Relative: 0 %
Immature Granulocytes: 1 %
Lymphocytes Relative: 9 %
Lymphs Abs: 1.4 10*3/uL (ref 0.7–4.0)
Monocytes Absolute: 0.5 10*3/uL (ref 0.1–1.0)
Monocytes Relative: 3 %
Neutro Abs: 13.8 10*3/uL — ABNORMAL HIGH (ref 1.7–7.7)
Neutrophils Relative %: 86 %

## 2018-11-19 LAB — URINALYSIS, ROUTINE W REFLEX MICROSCOPIC
Bilirubin Urine: NEGATIVE
Glucose, UA: >=500 mg/dL — AB
Ketones, ur: 40 mg/dL — AB
Leukocytes,Ua: NEGATIVE
Nitrite: NEGATIVE
Protein, ur: >300 mg/dL — AB
Specific Gravity, Urine: 1.01 (ref 1.005–1.030)
pH: 5 (ref 5.0–8.0)

## 2018-11-19 LAB — POCT I-STAT 7, (LYTES, BLD GAS, ICA,H+H)
Acid-base deficit: 6 mmol/L — ABNORMAL HIGH (ref 0.0–2.0)
Acid-base deficit: 1 mmol/L (ref 0.0–2.0)
Bicarbonate: 18.4 mmol/L — ABNORMAL LOW (ref 20.0–28.0)
Bicarbonate: 23.4 mmol/L (ref 20.0–28.0)
Calcium, Ion: 1.13 mmol/L — ABNORMAL LOW (ref 1.15–1.40)
Calcium, Ion: 1.12 mmol/L — ABNORMAL LOW (ref 1.15–1.40)
HCT: 47 % (ref 39.0–52.0)
HCT: 40 % (ref 39.0–52.0)
Hemoglobin: 16 g/dL (ref 13.0–17.0)
Hemoglobin: 13.6 g/dL (ref 13.0–17.0)
O2 Saturation: 90 %
O2 Saturation: 100 %
Patient temperature: 103.5
Patient temperature: 103.5
Potassium: 4.2 mmol/L (ref 3.5–5.1)
Potassium: 4.1 mmol/L (ref 3.5–5.1)
Sodium: 137 mmol/L (ref 135–145)
Sodium: 141 mmol/L (ref 135–145)
TCO2: 19 mmol/L — ABNORMAL LOW (ref 22–32)
TCO2: 25 mmol/L (ref 22–32)
pCO2 arterial: 36.1 mmHg (ref 32.0–48.0)
pCO2 arterial: 43.5 mmHg (ref 32.0–48.0)
pH, Arterial: 7.327 — ABNORMAL LOW (ref 7.350–7.450)
pH, Arterial: 7.35 (ref 7.350–7.450)
pO2, Arterial: 73 mmHg — ABNORMAL LOW (ref 83.0–108.0)
pO2, Arterial: 501 mmHg — ABNORMAL HIGH (ref 83.0–108.0)

## 2018-11-19 LAB — PROTIME-INR
INR: 1 (ref 0.8–1.2)
Prothrombin Time: 13.1 seconds (ref 11.4–15.2)

## 2018-11-19 LAB — CBC
HCT: 50 % (ref 39.0–52.0)
Hemoglobin: 15.8 g/dL (ref 13.0–17.0)
MCH: 29.1 pg (ref 26.0–34.0)
MCHC: 31.6 g/dL (ref 30.0–36.0)
MCV: 92.1 fL (ref 80.0–100.0)
Platelets: 324 10*3/uL (ref 150–400)
RBC: 5.43 MIL/uL (ref 4.22–5.81)
RDW: 11.9 % (ref 11.5–15.5)
WBC: 15.9 10*3/uL — ABNORMAL HIGH (ref 4.0–10.5)
nRBC: 0 % (ref 0.0–0.2)

## 2018-11-19 LAB — INFLUENZA PANEL BY PCR (TYPE A & B)
Influenza A By PCR: NEGATIVE
Influenza B By PCR: NEGATIVE

## 2018-11-19 LAB — GLUCOSE, CAPILLARY
Glucose-Capillary: 320 mg/dL — ABNORMAL HIGH (ref 70–99)
Glucose-Capillary: 246 mg/dL — ABNORMAL HIGH (ref 70–99)
Glucose-Capillary: 223 mg/dL — ABNORMAL HIGH (ref 70–99)
Glucose-Capillary: 198 mg/dL — ABNORMAL HIGH (ref 70–99)

## 2018-11-19 LAB — ETHANOL: Alcohol, Ethyl (B): <10 mg/dL (ref <10)

## 2018-11-19 LAB — LACTATE DEHYDROGENASE: LDH: 173 U/L (ref 98–192)

## 2018-11-19 LAB — LACTIC ACID, PLASMA
Lactic Acid, Venous: 4.1 mmol/L (ref 0.5–1.9)
Lactic Acid, Venous: 5.9 mmol/L (ref 0.5–1.9)
Lactic Acid, Venous: 3.4 mmol/L (ref 0.5–1.9)

## 2018-11-19 LAB — RAPID URINE DRUG SCREEN, HOSP PERFORMED
Amphetamines: NOT DETECTED
Barbiturates: NOT DETECTED
Benzodiazepines: NOT DETECTED
Cocaine: NOT DETECTED
Opiates: NOT DETECTED
Tetrahydrocannabinol: NOT DETECTED

## 2018-11-19 LAB — CSF CELL COUNT WITH DIFFERENTIAL
RBC Count, CSF: 27 /mm3 — ABNORMAL HIGH
Tube #: 4
WBC, CSF: 2 /mm3 (ref 0–5)

## 2018-11-19 LAB — APTT: aPTT: 25 seconds (ref 24–36)

## 2018-11-19 LAB — C-REACTIVE PROTEIN: CRP: <0.8 mg/dL (ref <1.0)

## 2018-11-19 LAB — I-STAT CREATININE, ED: Creatinine, Ser: 1.4 mg/dL — ABNORMAL HIGH (ref 0.61–1.24)

## 2018-11-19 LAB — FERRITIN: Ferritin: 36 ng/mL (ref 24–336)

## 2018-11-19 LAB — CK: Total CK: 58 U/L (ref 49–397)

## 2018-11-19 LAB — CBG MONITORING, ED: Glucose-Capillary: 290 mg/dL — ABNORMAL HIGH (ref 70–99)

## 2018-11-19 LAB — PROTEIN AND GLUCOSE, CSF
Glucose, CSF: 141 mg/dL — ABNORMAL HIGH (ref 40–70)
Total  Protein, CSF: 52 mg/dL — ABNORMAL HIGH (ref 15–45)

## 2018-11-19 LAB — STREP PNEUMONIAE URINARY ANTIGEN: Strep Pneumo Urinary Antigen: NEGATIVE

## 2018-11-19 MED ORDER — SODIUM CHLORIDE 0.9 % IV BOLUS
1000.0000 mL | Freq: Once | INTRAVENOUS | Status: AC
  Administered 2018-11-19: 06:00:00 1000 mL via INTRAVENOUS

## 2018-11-19 MED ORDER — THIAMINE HCL 100 MG/ML IJ SOLN
100.0000 mg | Freq: Every day | INTRAMUSCULAR | Status: DC
  Administered 2018-11-20 – 2018-11-21 (×2): 100 mg via INTRAVENOUS
  Filled 2018-11-19 (×2): qty 2

## 2018-11-19 MED ORDER — MIDAZOLAM HCL 2 MG/2ML IJ SOLN
1.0000 mg | INTRAMUSCULAR | Status: DC | PRN
  Administered 2018-11-19 – 2018-11-21 (×3): 1 mg via INTRAVENOUS
  Filled 2018-11-19 (×3): qty 2

## 2018-11-19 MED ORDER — ORAL CARE MOUTH RINSE
15.0000 mL | OROMUCOSAL | Status: DC
  Administered 2018-11-19 – 2018-11-23 (×35): 15 mL via OROMUCOSAL

## 2018-11-19 MED ORDER — ASPIRIN 325 MG PO TABS
325.0000 mg | ORAL_TABLET | Freq: Every day | ORAL | Status: DC
  Administered 2018-11-19 – 2018-11-24 (×5): 325 mg via ORAL
  Filled 2018-11-19 (×6): qty 1

## 2018-11-19 MED ORDER — ACETAMINOPHEN 650 MG RE SUPP
650.0000 mg | Freq: Four times a day (QID) | RECTAL | Status: DC | PRN
  Administered 2018-11-21: 650 mg via RECTAL
  Filled 2018-11-19: qty 1

## 2018-11-19 MED ORDER — THIAMINE HCL 100 MG/ML IJ SOLN: 100.0000 mg | Freq: Every day | INTRAMUSCULAR | Status: DC

## 2018-11-19 MED ORDER — HYDROXYCHLOROQUINE SULFATE 200 MG PO TABS
400.0000 mg | ORAL_TABLET | Freq: Two times a day (BID) | ORAL | Status: DC
  Filled 2018-11-19: qty 2

## 2018-11-19 MED ORDER — HYDROXYCHLOROQUINE SULFATE 200 MG PO TABS
200.0000 mg | ORAL_TABLET | Freq: Two times a day (BID) | ORAL | Status: DC
  Filled 2018-11-19: qty 1

## 2018-11-19 MED ORDER — CHLORHEXIDINE GLUCONATE 0.12 % MT SOLN: 15.0000 mL | Freq: Two times a day (BID) | OROMUCOSAL | Status: DC

## 2018-11-19 MED ORDER — FENTANYL CITRATE (PF) 100 MCG/2ML IJ SOLN
INTRAMUSCULAR | Status: AC
  Administered 2018-11-19: 100 ug via INTRAVENOUS
  Filled 2018-11-19: qty 2

## 2018-11-19 MED ORDER — SODIUM CHLORIDE 0.9 % IV SOLN
2.0000 g | INTRAVENOUS | Status: DC
  Filled 2018-11-19: qty 20

## 2018-11-19 MED ORDER — SODIUM CHLORIDE 0.9 % IV BOLUS
1000.0000 mL | Freq: Once | INTRAVENOUS | Status: AC
  Administered 2018-11-19: 1000 mL via INTRAVENOUS

## 2018-11-19 MED ORDER — SODIUM CHLORIDE 0.9 % IV SOLN
500.0000 mg | INTRAVENOUS | Status: DC
  Administered 2018-11-19 – 2018-11-21 (×3): 500 mg via INTRAVENOUS
  Filled 2018-11-19 (×5): qty 500

## 2018-11-19 MED ORDER — FENTANYL CITRATE (PF) 100 MCG/2ML IJ SOLN
50.0000 ug | INTRAMUSCULAR | Status: DC | PRN
  Filled 2018-11-19: qty 2

## 2018-11-19 MED ORDER — PANTOPRAZOLE SODIUM 40 MG IV SOLR
40.0000 mg | INTRAVENOUS | Status: DC
  Administered 2018-11-19 – 2018-11-20 (×2): 40 mg via INTRAVENOUS
  Filled 2018-11-19 (×2): qty 40

## 2018-11-19 MED ORDER — MIDAZOLAM HCL 2 MG/2ML IJ SOLN
INTRAMUSCULAR | Status: AC
  Administered 2018-11-19: 11:00:00 2 mg via INTRAVENOUS
  Filled 2018-11-19: qty 2

## 2018-11-19 MED ORDER — CHLORHEXIDINE GLUCONATE 0.12% ORAL RINSE (MEDLINE KIT)
15.0000 mL | Freq: Two times a day (BID) | OROMUCOSAL | Status: DC
  Administered 2018-11-19 – 2018-11-24 (×9): 15 mL via OROMUCOSAL

## 2018-11-19 MED ORDER — ASPIRIN EC 325 MG PO TBEC
325.0000 mg | DELAYED_RELEASE_TABLET | Freq: Every day | ORAL | Status: DC
  Filled 2018-11-19: qty 1

## 2018-11-19 MED ORDER — ACETAMINOPHEN 650 MG RE SUPP
650.0000 mg | Freq: Once | RECTAL | Status: AC
  Administered 2018-11-19: 650 mg via RECTAL
  Filled 2018-11-19: qty 1

## 2018-11-19 MED ORDER — ETOMIDATE 2 MG/ML IV SOLN
20.0000 mg | Freq: Once | INTRAVENOUS | Status: AC
  Administered 2018-11-19: 11:00:00 20 mg via INTRAVENOUS

## 2018-11-19 MED ORDER — SODIUM CHLORIDE 0.9 % IV SOLN
2.0000 g | Freq: Every day | INTRAVENOUS | Status: AC
  Administered 2018-11-19 – 2018-11-23 (×5): 2 g via INTRAVENOUS
  Filled 2018-11-19 (×5): qty 20

## 2018-11-19 MED ORDER — VANCOMYCIN HCL IN DEXTROSE 1-5 GM/200ML-% IV SOLN: 1000.0000 mg | Freq: Once | INTRAVENOUS | Status: DC

## 2018-11-19 MED ORDER — HYDROXYCHLOROQUINE SULFATE 200 MG PO TABS: 200.0000 mg | ORAL_TABLET | Freq: Two times a day (BID) | ORAL | Status: DC

## 2018-11-19 MED ORDER — FENTANYL 2500MCG IN NS 250ML (10MCG/ML) PREMIX INFUSION
0.0000 ug/h | INTRAVENOUS | Status: DC
  Administered 2018-11-19: 12:00:00 50 ug/h via INTRAVENOUS
  Administered 2018-11-21 (×2): 400 ug/h via INTRAVENOUS
  Filled 2018-11-19 (×3): qty 250

## 2018-11-19 MED ORDER — FENTANYL CITRATE (PF) 100 MCG/2ML IJ SOLN
50.0000 ug | INTRAMUSCULAR | Status: DC | PRN
  Administered 2018-11-19: 12:00:00 50 ug via INTRAVENOUS

## 2018-11-19 MED ORDER — HEPARIN SODIUM (PORCINE) 5000 UNIT/ML IJ SOLN
5000.0000 [IU] | Freq: Three times a day (TID) | INTRAMUSCULAR | Status: DC
  Administered 2018-11-19 – 2018-11-20 (×3): 5000 [IU] via SUBCUTANEOUS
  Filled 2018-11-19 (×3): qty 1

## 2018-11-19 MED ORDER — ROCURONIUM BROMIDE 50 MG/5ML IV SOLN
100.0000 mg | Freq: Once | INTRAVENOUS | Status: DC
  Filled 2018-11-19: qty 10

## 2018-11-19 MED ORDER — MIDAZOLAM HCL 2 MG/2ML IJ SOLN
2.0000 mg | Freq: Once | INTRAMUSCULAR | Status: AC
  Administered 2018-11-19: 2 mg via INTRAVENOUS

## 2018-11-19 MED ORDER — ASPIRIN 300 MG RE SUPP: 300.0000 mg | Freq: Every day | RECTAL | Status: DC

## 2018-11-19 MED ORDER — SODIUM CHLORIDE 0.9 % IV SOLN
2.0000 g | Freq: Once | INTRAVENOUS | Status: AC
  Administered 2018-11-19: 2 g via INTRAVENOUS
  Filled 2018-11-19: qty 2

## 2018-11-19 MED ORDER — ORAL CARE MOUTH RINSE: 15.0000 mL | Freq: Two times a day (BID) | OROMUCOSAL | Status: DC

## 2018-11-19 MED ORDER — FENTANYL CITRATE (PF) 100 MCG/2ML IJ SOLN
100.0000 ug | Freq: Once | INTRAMUSCULAR | Status: AC
  Administered 2018-11-19: 100 ug via INTRAVENOUS

## 2018-11-19 MED ORDER — SODIUM CHLORIDE 0.9 % IV SOLN: 2.0000 g | Freq: Three times a day (TID) | INTRAVENOUS | Status: DC

## 2018-11-19 MED ORDER — PANTOPRAZOLE SODIUM 40 MG IV SOLR: 40.0000 mg | Freq: Every day | INTRAVENOUS | Status: DC

## 2018-11-19 MED ORDER — MIDAZOLAM HCL 2 MG/2ML IJ SOLN
1.0000 mg | Freq: Once | INTRAMUSCULAR | Status: AC
  Administered 2018-11-19: 1 mg via INTRAVENOUS
  Filled 2018-11-19 (×2): qty 2

## 2018-11-19 MED ORDER — FOLIC ACID 5 MG/ML IJ SOLN
1.0000 mg | Freq: Every day | INTRAMUSCULAR | Status: DC
  Administered 2018-11-20 – 2018-11-21 (×2): 1 mg via INTRAVENOUS
  Filled 2018-11-19 (×5): qty 0.2

## 2018-11-19 MED ORDER — DEXMEDETOMIDINE HCL IN NACL 400 MCG/100ML IV SOLN
0.2000 ug/kg/h | INTRAVENOUS | Status: DC
  Administered 2018-11-19: 0.4 ug/kg/h via INTRAVENOUS
  Administered 2018-11-19 – 2018-11-20 (×7): 0.7 ug/kg/h via INTRAVENOUS
  Administered 2018-11-21 (×3): 1.2 ug/kg/h via INTRAVENOUS
  Filled 2018-11-19 (×11): qty 100

## 2018-11-19 MED ORDER — INSULIN ASPART 100 UNIT/ML ~~LOC~~ SOLN
0.0000 [IU] | SUBCUTANEOUS | Status: DC
  Administered 2018-11-19: 15 [IU] via SUBCUTANEOUS
  Administered 2018-11-19: 5 [IU] via SUBCUTANEOUS
  Administered 2018-11-19 (×2): 3 [IU] via SUBCUTANEOUS
  Administered 2018-11-19: 5 [IU] via SUBCUTANEOUS
  Administered 2018-11-20: 3 [IU] via SUBCUTANEOUS
  Administered 2018-11-20: 2 [IU] via SUBCUTANEOUS
  Administered 2018-11-20 (×2): 3 [IU] via SUBCUTANEOUS
  Administered 2018-11-20 – 2018-11-21 (×2): 2 [IU] via SUBCUTANEOUS
  Administered 2018-11-21: 3 [IU] via SUBCUTANEOUS
  Administered 2018-11-21 (×2): 2 [IU] via SUBCUTANEOUS
  Administered 2018-11-22: 5 [IU] via SUBCUTANEOUS
  Administered 2018-11-22 (×2): 2 [IU] via SUBCUTANEOUS
  Administered 2018-11-22 (×2): 3 [IU] via SUBCUTANEOUS
  Administered 2018-11-22: 2 [IU] via SUBCUTANEOUS

## 2018-11-19 MED ORDER — FUROSEMIDE 10 MG/ML IJ SOLN
40.0000 mg | Freq: Once | INTRAMUSCULAR | Status: AC
  Administered 2018-11-19: 05:00:00 40 mg via INTRAVENOUS
  Filled 2018-11-19: qty 4

## 2018-11-19 MED ORDER — METRONIDAZOLE IN NACL 5-0.79 MG/ML-% IV SOLN
500.0000 mg | Freq: Once | INTRAVENOUS | Status: AC
  Administered 2018-11-19: 500 mg via INTRAVENOUS
  Filled 2018-11-19: qty 100

## 2018-11-19 MED ORDER — VANCOMYCIN HCL 10 G IV SOLR
2000.0000 mg | Freq: Once | INTRAVENOUS | Status: DC
  Filled 2018-11-19: qty 2000

## 2018-11-19 MED ORDER — IOHEXOL 350 MG/ML SOLN
100.0000 mL | Freq: Once | INTRAVENOUS | Status: AC | PRN
  Administered 2018-11-19: 04:00:00 100 mL via INTRAVENOUS

## 2018-11-19 MED ORDER — ASPIRIN 300 MG RE SUPP
300.0000 mg | Freq: Every day | RECTAL | Status: DC
  Administered 2018-11-21: 300 mg via RECTAL
  Filled 2018-11-19 (×4): qty 1

## 2018-11-19 MED ORDER — FOLIC ACID 5 MG/ML IJ SOLN
1.0000 mg | Freq: Every day | INTRAMUSCULAR | Status: DC
  Administered 2018-11-19: 1 mg via INTRAVENOUS
  Filled 2018-11-19 (×2): qty 0.2

## 2018-11-19 MED ORDER — VANCOMYCIN HCL 10 G IV SOLR
1250.0000 mg | INTRAVENOUS | Status: DC
  Filled 2018-11-19: qty 1250

## 2018-11-19 MED ORDER — HYDROXYCHLOROQUINE SULFATE 200 MG PO TABS
400.0000 mg | ORAL_TABLET | Freq: Two times a day (BID) | ORAL | Status: AC
  Filled 2018-11-19 (×2): qty 2

## 2018-11-19 NOTE — Progress Notes (Signed)
STROKE TEAM PROGRESS NOTE  INTERVAL HISTORY Patient was just intubated by Dr. Nelda Marseille for progressive respiratory failureand is currently paralyzed and sedated.he is on COVID rule out precautions  Vitals:   11/19/18 1215 11/19/18 1230 11/19/18 1245 11/19/18 1354  BP: 96/62 110/69 113/71 108/68  Pulse: 66 66 66 62  Resp: 15 14 14 16   Temp:      TempSrc:      SpO2: 100% 100% 100% 100%  Weight:      Height:        CBC:  Recent Labs  Lab 11/19/18 0320 11/19/18 0549 11/19/18 1139  WBC 15.9*  --   --   NEUTROABS 13.8*  --   --   HGB 15.8 16.0 13.6  HCT 50.0 47.0 40.0  MCV 92.1  --   --   PLT 324  --   --     Basic Metabolic Panel:  Recent Labs  Lab 11/19/18 0320 11/19/18 0402 11/19/18 0549 11/19/18 1139  NA 137  --  137 141  K 4.6  --  4.2 4.1  CL 100  --   --   --   CO2 22  --   --   --   GLUCOSE 325*  --   --   --   BUN 21  --   --   --   CREATININE 1.64* 1.40*  --   --   CALCIUM 9.6  --   --   --    Lipid Panel:     Component Value Date/Time   CHOL 137 09/19/2018 0743   TRIG 156 (H) 09/19/2018 0743   HDL 39 (L) 09/19/2018 0743   CHOLHDL 3.5 09/19/2018 0743   CHOLHDL 3 06/18/2017 0847   VLDL 21.0 06/18/2017 0847   LDLCALC 67 09/19/2018 0743   HgbA1c:  Lab Results  Component Value Date   HGBA1C 6.5 (A) 11/04/2018   Urine Drug Screen:     Component Value Date/Time   LABOPIA NONE DETECTED 11/19/2018 0459   COCAINSCRNUR NONE DETECTED 11/19/2018 0459   LABBENZ NONE DETECTED 11/19/2018 0459   AMPHETMU NONE DETECTED 11/19/2018 0459   THCU NONE DETECTED 11/19/2018 0459   LABBARB NONE DETECTED 11/19/2018 0459    Alcohol Level     Component Value Date/Time   ETH <10 11/19/2018 0320    IMAGING Ct Angio Head W Or Wo Contrast  Result Date: 11/19/2018 CLINICAL DATA:  Aphasia and confusion. Follow up code stroke. History of LEFT carotid endarterectomy, hypertension and diabetes. EXAM: CT ANGIOGRAPHY HEAD AND NECK CT PERFUSION BRAIN TECHNIQUE: Multidetector  CT imaging of the head and neck was performed using the standard protocol during bolus administration of intravenous contrast. Multiplanar CT image reconstructions and MIPs were obtained to evaluate the vascular anatomy. Carotid stenosis measurements (when applicable) are obtained utilizing NASCET criteria, using the distal internal carotid diameter as the denominator. Multiphase CT imaging of the brain was performed following IV bolus contrast injection. Subsequent parametric perfusion maps were calculated using RAPID software. CONTRAST:  179mL OMNIPAQUE IOHEXOL 350 MG/ML SOLN COMPARISON:  CT HEAD November 19, 2018 and CT angiogram neck August 01, 2017 FINDINGS: CTA NECK FINDINGS: AORTIC ARCH: Normal appearance of the thoracic arch, 2 vessel arch is a normal variant. Mild calcific atherosclerosis and intimal thickening. The origins of the innominate, left Common carotid artery and subclavian artery are patent. RIGHT CAROTID SYSTEM: Common carotid artery is patent. Moderate calcific atherosclerosis of the carotid bifurcation without hemodynamically significant stenosis by NASCET criteria. Normal  appearance of the internal carotid artery. LEFT CAROTID SYSTEM: Common carotid artery is patent, moderate intimal thickening and mild stenosis. Status post LEFT carotid endarterectomy. Within 1 cm of the origin is and 11 mm segment of 60% stenosis by NASCET criteria. VERTEBRAL ARTERIES:Codominant vertebral arteries. Moderate to severe bilateral V1 segment stenosis. SKELETON: No acute osseous process though bone windows have not been submitted. OTHER NECK: Soft tissues of the neck are nonacute though, not tailored for evaluation. UPPER CHEST: Small bilateral pleural effusions. Interlobular septal thickening, bronchial wall thickening and mosaic attenuation seen with pulmonary edema. Status post median sternotomy. CTA HEAD FINDINGS: ANTERIOR CIRCULATION: Patent cervical internal carotid arteries, petrous, cavernous and supra  clinoid internal carotid arteries. Calcific atherosclerosis resulting in moderate stenosis bilateral carotid siphon. Patent anterior communicating artery. Patent anterior and middle cerebral arteries. Moderate stenosis RIGHT A2 segment, RIGHT M2 No large vessel occlusion, flow-limiting stenosis, contrast extravasation or aneurysm. POSTERIOR CIRCULATION: Patent vertebral arteries, vertebrobasilar junction and basilar artery, as well as main branch vessels. Calcific atherosclerosis resulting in moderate stenosis moderate stenosis distal RIGHT V4 segment. Patent posterior cerebral arteries, moderate luminal irregularity compatible with atherosclerosis. No large vessel occlusion, flow-limiting stenosis, contrast extravasation or aneurysm. VENOUS SINUSES: Major dural venous sinuses are patent though not tailored for evaluation on this angiographic examination. ANATOMIC VARIANTS: Hypoplastic LEFT A1 segment. DELAYED PHASE: Not performed. MIP images reviewed. CT Brain Perfusion Findings: CBF (<30%) Volume: 73mL Perfusion (Tmax>6.0s) volume: 54mL Mismatch Volume: 57mL Infarction Location:LEFT occipital lobe. IMPRESSION: CTA NECK: 1. Status post LEFT carotid endarterectomy with 60% stenosis LEFT ICA by NASCET criteria. 2. Patent vertebral arteries. Moderate to severe stenosis bilateral V1 segments. 3. Small pleural effusions and findings of pulmonary edema. Recommend chest radiograph. CTA HEAD: 1. No emergent large vessel occlusion. 2. Moderate stenosis bilateral ICA. RIGHT vertebral artery, anterior, middle and posterior cerebral arteries multifocal moderate stenoses compatible with atherosclerosis. CT PERFUSION: 1. 4 cc LEFT occipital penumbra without core infarct. Critical Value/emergent results text paged to Dr.MCNEILL [facility] via AMION secure system on 11/19/2018 at 3:57 am, including interpreting physician's phone number. Electronically Signed   By: Elon Alas M.D.   On: 11/19/2018 04:00   Ct Angio Neck W Or Wo  Contrast  Result Date: 11/19/2018 CLINICAL DATA:  Aphasia and confusion. Follow up code stroke. History of LEFT carotid endarterectomy, hypertension and diabetes. EXAM: CT ANGIOGRAPHY HEAD AND NECK CT PERFUSION BRAIN TECHNIQUE: Multidetector CT imaging of the head and neck was performed using the standard protocol during bolus administration of intravenous contrast. Multiplanar CT image reconstructions and MIPs were obtained to evaluate the vascular anatomy. Carotid stenosis measurements (when applicable) are obtained utilizing NASCET criteria, using the distal internal carotid diameter as the denominator. Multiphase CT imaging of the brain was performed following IV bolus contrast injection. Subsequent parametric perfusion maps were calculated using RAPID software. CONTRAST:  175mL OMNIPAQUE IOHEXOL 350 MG/ML SOLN COMPARISON:  CT HEAD November 19, 2018 and CT angiogram neck August 01, 2017 FINDINGS: CTA NECK FINDINGS: AORTIC ARCH: Normal appearance of the thoracic arch, 2 vessel arch is a normal variant. Mild calcific atherosclerosis and intimal thickening. The origins of the innominate, left Common carotid artery and subclavian artery are patent. RIGHT CAROTID SYSTEM: Common carotid artery is patent. Moderate calcific atherosclerosis of the carotid bifurcation without hemodynamically significant stenosis by NASCET criteria. Normal appearance of the internal carotid artery. LEFT CAROTID SYSTEM: Common carotid artery is patent, moderate intimal thickening and mild stenosis. Status post LEFT carotid endarterectomy. Within 1 cm of the origin  is and 11 mm segment of 60% stenosis by NASCET criteria. VERTEBRAL ARTERIES:Codominant vertebral arteries. Moderate to severe bilateral V1 segment stenosis. SKELETON: No acute osseous process though bone windows have not been submitted. OTHER NECK: Soft tissues of the neck are nonacute though, not tailored for evaluation. UPPER CHEST: Small bilateral pleural effusions. Interlobular  septal thickening, bronchial wall thickening and mosaic attenuation seen with pulmonary edema. Status post median sternotomy. CTA HEAD FINDINGS: ANTERIOR CIRCULATION: Patent cervical internal carotid arteries, petrous, cavernous and supra clinoid internal carotid arteries. Calcific atherosclerosis resulting in moderate stenosis bilateral carotid siphon. Patent anterior communicating artery. Patent anterior and middle cerebral arteries. Moderate stenosis RIGHT A2 segment, RIGHT M2 No large vessel occlusion, flow-limiting stenosis, contrast extravasation or aneurysm. POSTERIOR CIRCULATION: Patent vertebral arteries, vertebrobasilar junction and basilar artery, as well as main branch vessels. Calcific atherosclerosis resulting in moderate stenosis moderate stenosis distal RIGHT V4 segment. Patent posterior cerebral arteries, moderate luminal irregularity compatible with atherosclerosis. No large vessel occlusion, flow-limiting stenosis, contrast extravasation or aneurysm. VENOUS SINUSES: Major dural venous sinuses are patent though not tailored for evaluation on this angiographic examination. ANATOMIC VARIANTS: Hypoplastic LEFT A1 segment. DELAYED PHASE: Not performed. MIP images reviewed. CT Brain Perfusion Findings: CBF (<30%) Volume: 71mL Perfusion (Tmax>6.0s) volume: 36mL Mismatch Volume: 1mL Infarction Location:LEFT occipital lobe. IMPRESSION: CTA NECK: 1. Status post LEFT carotid endarterectomy with 60% stenosis LEFT ICA by NASCET criteria. 2. Patent vertebral arteries. Moderate to severe stenosis bilateral V1 segments. 3. Small pleural effusions and findings of pulmonary edema. Recommend chest radiograph. CTA HEAD: 1. No emergent large vessel occlusion. 2. Moderate stenosis bilateral ICA. RIGHT vertebral artery, anterior, middle and posterior cerebral arteries multifocal moderate stenoses compatible with atherosclerosis. CT PERFUSION: 1. 4 cc LEFT occipital penumbra without core infarct. Critical Value/emergent  results text paged to Dr.MCNEILL [facility] via AMION secure system on 11/19/2018 at 3:57 am, including interpreting physician's phone number. Electronically Signed   By: Elon Alas M.D.   On: 11/19/2018 04:00   Ct Cerebral Perfusion W Contrast  Result Date: 11/19/2018 CLINICAL DATA:  Aphasia and confusion. Follow up code stroke. History of LEFT carotid endarterectomy, hypertension and diabetes. EXAM: CT ANGIOGRAPHY HEAD AND NECK CT PERFUSION BRAIN TECHNIQUE: Multidetector CT imaging of the head and neck was performed using the standard protocol during bolus administration of intravenous contrast. Multiplanar CT image reconstructions and MIPs were obtained to evaluate the vascular anatomy. Carotid stenosis measurements (when applicable) are obtained utilizing NASCET criteria, using the distal internal carotid diameter as the denominator. Multiphase CT imaging of the brain was performed following IV bolus contrast injection. Subsequent parametric perfusion maps were calculated using RAPID software. CONTRAST:  172mL OMNIPAQUE IOHEXOL 350 MG/ML SOLN COMPARISON:  CT HEAD November 19, 2018 and CT angiogram neck August 01, 2017 FINDINGS: CTA NECK FINDINGS: AORTIC ARCH: Normal appearance of the thoracic arch, 2 vessel arch is a normal variant. Mild calcific atherosclerosis and intimal thickening. The origins of the innominate, left Common carotid artery and subclavian artery are patent. RIGHT CAROTID SYSTEM: Common carotid artery is patent. Moderate calcific atherosclerosis of the carotid bifurcation without hemodynamically significant stenosis by NASCET criteria. Normal appearance of the internal carotid artery. LEFT CAROTID SYSTEM: Common carotid artery is patent, moderate intimal thickening and mild stenosis. Status post LEFT carotid endarterectomy. Within 1 cm of the origin is and 11 mm segment of 60% stenosis by NASCET criteria. VERTEBRAL ARTERIES:Codominant vertebral arteries. Moderate to severe bilateral V1  segment stenosis. SKELETON: No acute osseous process though bone windows have not  been submitted. OTHER NECK: Soft tissues of the neck are nonacute though, not tailored for evaluation. UPPER CHEST: Small bilateral pleural effusions. Interlobular septal thickening, bronchial wall thickening and mosaic attenuation seen with pulmonary edema. Status post median sternotomy. CTA HEAD FINDINGS: ANTERIOR CIRCULATION: Patent cervical internal carotid arteries, petrous, cavernous and supra clinoid internal carotid arteries. Calcific atherosclerosis resulting in moderate stenosis bilateral carotid siphon. Patent anterior communicating artery. Patent anterior and middle cerebral arteries. Moderate stenosis RIGHT A2 segment, RIGHT M2 No large vessel occlusion, flow-limiting stenosis, contrast extravasation or aneurysm. POSTERIOR CIRCULATION: Patent vertebral arteries, vertebrobasilar junction and basilar artery, as well as main branch vessels. Calcific atherosclerosis resulting in moderate stenosis moderate stenosis distal RIGHT V4 segment. Patent posterior cerebral arteries, moderate luminal irregularity compatible with atherosclerosis. No large vessel occlusion, flow-limiting stenosis, contrast extravasation or aneurysm. VENOUS SINUSES: Major dural venous sinuses are patent though not tailored for evaluation on this angiographic examination. ANATOMIC VARIANTS: Hypoplastic LEFT A1 segment. DELAYED PHASE: Not performed. MIP images reviewed. CT Brain Perfusion Findings: CBF (<30%) Volume: 75mL Perfusion (Tmax>6.0s) volume: 24mL Mismatch Volume: 59mL Infarction Location:LEFT occipital lobe. IMPRESSION: CTA NECK: 1. Status post LEFT carotid endarterectomy with 60% stenosis LEFT ICA by NASCET criteria. 2. Patent vertebral arteries. Moderate to severe stenosis bilateral V1 segments. 3. Small pleural effusions and findings of pulmonary edema. Recommend chest radiograph. CTA HEAD: 1. No emergent large vessel occlusion. 2. Moderate  stenosis bilateral ICA. RIGHT vertebral artery, anterior, middle and posterior cerebral arteries multifocal moderate stenoses compatible with atherosclerosis. CT PERFUSION: 1. 4 cc LEFT occipital penumbra without core infarct. Critical Value/emergent results text paged to Dr.MCNEILL [facility] via AMION secure system on 11/19/2018 at 3:57 am, including interpreting physician's phone number. Electronically Signed   By: Elon Alas M.D.   On: 11/19/2018 04:00   Portable Chest X-ray  Result Date: 11/19/2018 CLINICAL DATA:  Endotracheal tube placement EXAM: PORTABLE CHEST 1 VIEW COMPARISON:  11/19/2018 FINDINGS: Endotracheal tube with the tip 5 cm above above the carina. Nasogastric tube coursing below the diaphragm. Bilateral mild interstitial thickening similar in appearance to the prior exam. No pleural effusion or pneumothorax. Stable cardiomediastinal silhouette. Prior CABG. IMPRESSION: 1. Endotracheal tube with the tip 5 cm above the carina. 2. Persistent mild CHF. Electronically Signed   By: Kathreen Devoid   On: 11/19/2018 11:28   Dg Chest Portable 1 View  Result Date: 11/19/2018 CLINICAL DATA:  Hypoxia.  Facial droop EXAM: PORTABLE CHEST 1 VIEW COMPARISON:  08/25/2017 FINDINGS: Diffuse interstitial coarsening with Kerley lines. Cardiomegaly. Prior CABG. There is pleural fluid by preceding neck CTA. IMPRESSION: CHF pattern. Electronically Signed   By: Monte Fantasia M.D.   On: 11/19/2018 04:14   Ct Head Code Stroke Wo Contrast  Addendum Date: 11/19/2018   ADDENDUM REPORT: 11/19/2018 03:45 ADDENDUM: Critical Value/emergent results text paged to [facility], Neurology via AMION secure system on 11/19/2018 at 3:35 am, including interpreting physician's phone number. Electronically Signed   By: Elon Alas M.D.   On: 11/19/2018 03:45   Result Date: 11/19/2018 CLINICAL DATA:  Code stroke. Aphasia and confusion. History of LEFT carotid endarterectomy, hypertension and diabetes. EXAM: CT HEAD  WITHOUT CONTRAST TECHNIQUE: Contiguous axial images were obtained from the base of the skull through the vertex without intravenous contrast. COMPARISON:  None. FINDINGS: BRAIN: No intraparenchymal hemorrhage, mass effect nor midline shift. No parenchymal brain volume loss for age. No hydrocephalus. Patchy supratentorial white matter hypodensities less than expected for patient's age, though non-specific are most compatible with chronic small vessel  ischemic disease. Cystic RIGHT basal ganglia lacunar infarcts. No acute large vascular territory infarcts. No abnormal extra-axial fluid collections. Basal cisterns are patent. VASCULAR: Mild-to-moderate calcific atherosclerosis of the carotid siphons. SKULL: No skull fracture. No significant scalp soft tissue swelling. SINUSES/ORBITS: Trace paranasal sinus mucosal thickening. Mastoid air cells are well aerated.The included ocular globes and orbital contents are non-suspicious. Status post bilateral ocular lens implants. OTHER: None. ASPECTS [facility] Stroke Program Early CT Score) - Ganglionic level infarction (caudate, lentiform nuclei, internal capsule, insula, M1-M3 cortex): 7 - Supraganglionic infarction (M4-M6 cortex): 3 Total score (0-10 with 10 being normal): 10 IMPRESSION: 1. No acute intracranial process. 2. ASPECTS is 10. 3. 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 Electronically Signed: By: Elon Alas M.D. On: 11/19/2018 03:36    PHYSICAL EXAM Elderly Caucasian male who is sedated intubated and paralyzed. . Afebrile. Head is nontraumatic. Neck is supple without bruit.    Cardiac exam no murmur or gallop. Lungs are clear to auscultation. Distal pulses are well felt. Neurological Exam : limited due to sedation and paralysis and intubation Eyes are closed. pupils are 2 mm sluggishly reactive. Doll's eye movements absent. Corneal reflexes are sluggish bilaterally.minimal cough and gag.no response to  sternal rub. No involuntary or involuntary extremity moments. Deep tendon reflexes are depressed. Plantars are not elicitable.  ASSESSMENT/PLAN [name] is a 78 y.o. male with history of HTN, HLD , DM, ALCOHOL abuse presenting with altered mental status and R facial droop, R arm drift. Found to have fever with tachycardia and hypoxia. D/t rapid progression, LP performed. [name]   Possible L brain Stroke  Code Stroke CT head No acute stroke. Old R BG lacune. ASPECTS 10.     CTA head no LVO. Mod B ICA stenosis. R VA, ACA, MCA and PCA atherosclerosis   CTA neck s/p L CEA w/ 60% stenosis. Mod to severe B VA stenosis V1. Small pleural effusion and pulm edema  CT perfusion 4cc L occipital penumbra w/o core infarct  CXR CHF  LP CSF RBC 27, WBC 2, Glu 141, TP 52  UDS neg  MRI  pending   LDL 67  HgbA1c 6.5  Heparin 5000 units sq tid for VTE prophylaxis Diet Order            Diet NPO time specified  Diet effective now               aspirin 81 mg daily prior to admission, now on aspirin 300 mg suppository daily or 325 mg per tube daily.   Therapy recommendations:  pending   Disposition:  pending   Acute Respiratory Failure with hypoxemia Close exposure to COVID  Febrile  TM 104.2  Lactic acid 5.9  Intubated   Novel coronavirus pending   Hypotension Hx Hypertension . If acute stroke, Permissive hypertension (OK if < 220/120) but gradually normalize in 5-7 days . Long-term BP goal normotensive  Hyperlipidemia  Home meds:  zocor 40  Not on statin in hospital  LDL 67, goal < 70 if stroke  Recommend Continuation of statin at discharge  Diabetes type II, controlled  HgbA1c 6.5, goal < 7.0  DB RN coordinator following. Consider adding levemir 10 bid.  Other Stroke Risk Factors  Advanced age  Hx ETOH abuse   Obesity, Body mass index is 33.52 kg/m., recommend weight loss, diet and exercise as appropriate   Coronary artery disease  Obstructive  sleep apnea, on CPAP at home  Hospital day # 0 I have personally obtained history,examined this patient, reviewed notes, independently viewed imaging studies, participated in medical decision making and plan of care.ROS completed by me personally and pertinent positives fully documented  I have made any additions or clarifications directly to the above note. He presented with sudden onset of speech difficulty and right-sided weakness and CT perfusion shows a small left occipital perfusion defect. He shortly thereafter developed respiratory distress requiring intubation and is currently being ruled out for COVID pneumonia. Spinal tap shows no evidence of inflammation except elevated CSF protein is likely related to his diabetes. Neurological exam is quite limited due to his sedation and paralysis. He likely will need MRI scan to look for suspected embolic left hemispheric infarct. Moderate left carotid stenosis may be to blame but alternative sources need to be looked for as well.This may BE held for 24-36 hours if the results of COVID diagnostic testing can be obtained but if it is going to take 5-7 days for the results would like to complete MRI before the results.if left hemispheric infarct is confirmed on MRI may need elective left carotid revascularization.continue aspirin and for now. Discuss with Dr. Nelda Marseille and Dr. Jeralyn Ruths neuroradiologist and answered questions.This patient is critically ill and at significant risk of neurological worsening, death and care requires constant monitoring of vital signs, hemodynamics,respiratory and cardiac monitoring, extensive review of multiple databases, frequent neurological assessment, discussion with family, other specialists and medical decision making of high complexity.I have made any additions or clarifications directly to the above note.This critical care time does not reflect procedure time, or teaching time or supervisory time of PA/NP/Med Resident etc but could  involve care discussion time.  I spent 30 minutes of neurocritical care time  in the care of  this patient.      Antony Contras, MD Medical Director [facility] Pager: [phone] 11/19/2018 3:24 PM   To contact Stroke Continuity provider, please refer to [email or web address] After hours, contact General Neurology

## 2018-11-19 NOTE — Consult Note (Addendum)
Neurology Consultation Reason for Consult: Aphasia Referring Physician: Rancor, S  CC: Difficulty speaking  History is obtained from: Patient  HPI: Larry [name] is a 78 y.o. male who was in his normal state of health until around 8 PM.  At that point he Dropped his head and said I feel bad I need to go to bed.  He then over the course of the night he developed severe nausea and vomiting.  His wife gave him something for nausea subsequently he began slurring his words and not making sense.  She drove him to the hospital.  Due to aphasia and some concern for right-sided weakness a code stroke was activated.  After arrival to the emergency department, he developed progressive hypoxia and tachycardia.  Chest x-ray shows CHF type pattern.Though an allergy was listed in epic to contrast, this was simply nausea and vomiting, not true allergy.    LKW: 8 PM tpa given?: no, outside of window   ROS: Unable to obtain due to altered mental status.   Past Medical History:  Diagnosis Date  . Carotid stenosis, left   . Cataracts, bilateral    removed  . Coronary artery disease   . Hyperlipemia   . Prostate CA ([place])   . Sleep apnea    Dr Ledora Bottcher ; uses CPAP  . Type II or unspecified type diabetes mellitus without mention of complication, not stated as uncontrolled   . Unspecified essential hypertension      Family History  Problem Relation Age of Onset  . Heart disease Mother   . Diabetes Other   . Hypertension Other   . Colon cancer Neg Hx      Social History:  reports that he has never smoked. He has never used smokeless tobacco. He reports that he does not drink alcohol or use drugs.   Exam: Current vital signs: BP (!) 192/117   Pulse (!) 109   Temp (!) 102.4 F (39.1 C) (Rectal)   Resp 20   SpO2 91%  Vital signs in last 24 hours: Temp:  [102.4 F (39.1 C)] 102.4 F (39.1 C) (04/01 0412) Pulse Rate:  [109] 109 (04/01 0400) Resp:  [20] 20 (04/01 0400) BP: (192)/(117)  192/117 (04/01 0400) SpO2:  [91 %] 91 % (04/01 0400)   Physical Exam  Constitutional: Appears well-developed and well-nourished.  Psych: Affect appropriate to situation Eyes: No scleral injection HENT: No OP obstrucion Head: Normocephalic.  Cardiovascular: Normal rate and regular rhythm.  Respiratory: Effort normal, non-labored breathing GI: Soft.  No distension. There is no tenderness.  Skin: WDI  Neuro: Mental Status: Patient is awake, he does not reliably follow commands, he does answer some questions at time, but it is clearly fairly severely aphasic  Cranial Nerves: II: Does not respond to visual stimuli in the right visual field, does respond on the left pupils are equal, round, and reactive to light.   III,IV, VI: EOMI without ptosis or diploplia.  V: Facial sensation is symmetric to temperature VII: Facial movement with?  Mild right facial weakness VIII: hearing is intact to voice X: Uvula elevates symmetrically XI: Shoulder shrug is symmetric. XII: tongue is midline without atrophy or fasciculations.  Motor: Tone is normal. Bulk is normal. 5/5 strength was present in bilateral legs, and left arm, he appears to have some mild 4/5 weakness the right arm Sensory: He responds to noxious stimulation in all 4 extremities Cerebellar: He does not have any clear ataxia, but does not cooperate with formal  testing.   I have reviewed labs in epic and the results pertinent to this consultation are: lactic acidosis of 4.1   I have reviewed the images obtained: CT/CTA-no LVO or acute findings on CT CT perfusion there is a small area of hypoperfusion in the left parietal lobe which is smaller than the patient's symptoms.  Impression: 78 year old male with progressive confusion, nausea and vomiting, hypoxia, tachycardia over the course of the night.  He does appear to have some right facial droop and right arm weakness, but looking at his picture in epic, I wonder if some of the  right facial droop is old.  Possibilities at this time include stroke, septic encephalopathy superimposed on baseline deficits, acute CNS infectious process.   I think stroke is the most likely explanation for his neurological change, however this does not explain his tachycardia or hypoxia.  Given the rapid progression of fever, I do think that a lumbar puncture to rule out CNS infection would be prudent at this time.  Recommendations: - HgbA1c, fasting lipid panel - MRI of the brain without contrast - Frequent neuro checks - Echocardiogram - Prophylactic therapy-Antiplatelet med: Aspirin - dose 325mg  PO or 300mg  PR - Risk factor modification - Telemetry monitoring - PT consult, OT consult, Speech consult - Stroke team to follow - Lumbar puncture for cells, glucose, protein, culture, HSV  Roland Rack, MD [facility] [phone]  If 7pm- 7am, please page neurology on call as listed in [place].

## 2018-11-19 NOTE — ED Notes (Signed)
This RN found pt ambulating in room with all monitoring equipment removed and pt confused. EDP notified and order for non-violent restraints placed.

## 2018-11-19 NOTE — Progress Notes (Signed)
Inpatient Diabetes Program Recommendations  AACE/ADA: New Consensus Statement on Inpatient Glycemic Control (2015)  Target Ranges:  Prepandial:   less than 140 mg/dL      Peak postprandial:   less than 180 mg/dL (1-2 hours)      Critically ill patients:  140 - 180 mg/dL   Lab Results  Component Value Date   GLUCAP 290 (H) 11/19/2018   HGBA1C 6.5 (A) 11/04/2018    Review of Glycemic Control Results for Larry [name], Larry [name] (MRN [id number]) as of 11/19/2018 11:29  Ref. Range 11/19/2018 03:16  Glucose-Capillary Latest Ref Range: 70 - 99 mg/dL 290 (H)   Diabetes history: DM 2 Outpatient Diabetes medications:  Amaryl 2 mg bid, Metformin 1000 mg bid Current orders for Inpatient glycemic control:  Novolog moderate q 4 hours Inpatient Diabetes Program Recommendations:    If blood sugars continue to be>goal, may consider adding weight based basal insulin.  Consider adding Levemir 10 units bid.    Thanks  Adah Perl, RN, BC-ADM Inpatient Diabetes Coordinator Pager [phone] (8a-5p)

## 2018-11-19 NOTE — Progress Notes (Signed)
RT NOTES: Advanced ET tube by 2 to 26 at the lip per MD.

## 2018-11-19 NOTE — Procedures (Signed)
OGT Placement By MD  Placed under direct laryngoscopy while intubation box was over the patient to prevent further exposure and verified by auscultation  Rush Farmer, M.D. [facility] Pulmonary/Critical Care Medicine. Pager: [phone]. After hours pager: [phone]

## 2018-11-19 NOTE — Progress Notes (Addendum)
OG suspected to not be in proper position. MD ordered abd x-ray that confirmed it was too high. Attempted to insert further, but was unable to. OG tube pulled, new OG tube inserted. X-ray still showed OG was not in proper position. Tube pulled, MD made aware.  04/01 0000- MD Sommer ordered for the NG/OG tube to be attempted again. Sharyn Lull, RN attempted to insert the tube. Repeat x-ray showed that the tube was not inserted far enough. NG tube advanced and abd x-ray ordered with morning chest x-ray.

## 2018-11-19 NOTE — Progress Notes (Signed)
Pharmacy Antibiotic Note  Larry [name] is a 78 y.o. male admitted on 11/19/2018 with r/o sepsis.  Pharmacy has been consulted for Vancomycin/Cefepime dosing. Neurology would like to r/o CNS infection, will use traditional vancomycin dosing and max cefepime dose. WBC elevated. Noted bump in Scr.   Plan: -Vancomycin 2000 mg IV x 1, then 1250 mg IV q24h (increase dose if Scr improves) -Cefepime 2g IV q8h -Trend WBC, temp, renal function  F/U infectious work-up -Drug levels as indicated   Height: 6' (182.9 cm) Weight: 247 lb 5.7 oz (112.2 kg) IBW/kg (Calculated) : 77.6  Temp (24hrs), Avg:102.4 F (39.1 C), Min:102.4 F (39.1 C), Max:102.4 F (39.1 C)  Recent Labs  Lab 11/19/18 0320 11/19/18 0402 11/19/18 0413  WBC 15.9*  --   --   CREATININE 1.64* 1.40*  --   LATICACIDVEN  --   --  4.1*    Estimated Creatinine Clearance: 57.1 mL/min (A) (by C-G formula based on SCr of 1.4 mg/dL (H)).    Allergies  Allergen Reactions  . Amlodipine Swelling    Leg swelling  . Verapamil Swelling    Edema w/high dose  . Clindamycin Hcl     UNSPECIFIED REACTION   . Contrast Media [Iodinated Diagnostic Agents] Nausea And Vomiting     Narda Bonds 11/19/2018 5:48 AM

## 2018-11-19 NOTE — ED Provider Notes (Signed)
[place] EMERGENCY DEPARTMENT Provider Note   CSN: [id number] Arrival date & time: 11/19/18  0305    History   Chief Complaint Chief Complaint  Patient presents with   Code Stroke    HPI Larry [name] is a 78 y.o. male.     Level 5 caveat for acuity of condition.  Patient with a history of CAD, diabetes, hypertension, left carotid stenosis presenting with acute change in mental status, aphasia and confusion.  Symptoms started acutely about 7:30 PM.  Patient was fine prior to this.  Family reports he had some pizza and soon after that began to feel ill.  He had his head in his hands and said he needed to lie down.  He then developed multiple episodes of nausea and vomiting.  This continued throughout the night.  Patient's wife noticed that he was having difficulty speaking difficulty getting his words out and slurring his words and not making sense.  She then drove him to the hospital.  Patient is able to state his name but not able to give much more information.  Currently reports no recent illnesses up until tonight.  No fever, chills, runny nose, sore throat or cough.  No sick contacts at home.  The history is provided by the patient and the spouse.    Past Medical History:  Diagnosis Date   Carotid stenosis, left    Cataracts, bilateral    removed   Coronary artery disease    Hyperlipemia    Prostate CA [facility])    Sleep apnea    Dr Ledora Bottcher ; uses CPAP   Type II or unspecified type diabetes mellitus without mention of complication, not stated as uncontrolled    Unspecified essential hypertension     Patient Active Problem List   Diagnosis Date Noted   Upper respiratory infection 09/22/2018   Chronic combined systolic and diastolic heart failure ([place]) 08/25/2017   Carotid stenosis 08/22/2017   Coronary artery disease involving coronary bypass graft of native heart with angina pectoris ([place]) 06/27/2017   DOE (dyspnea on exertion)  06/18/2017   Migraine with aura 11/21/2016   Sciatic radiculitis 03/23/2016   OBSTRUCTIVE SLEEP APNEA 10/23/2010   LOW BACK PAIN, ACUTE 07/19/2009   DM2 (diabetes mellitus, type 2) ([place]) 06/24/2007   Dyslipidemia 06/24/2007   Essential hypertension 06/24/2007   PROSTATE CANCER, HX OF 06/24/2007    Past Surgical History:  Procedure Laterality Date   APPENDECTOMY     CARDIAC CATHETERIZATION     CATARACT EXTRACTION, BILATERAL     CORONARY ARTERY BYPASS GRAFT N/A 06/27/2017   Procedure: CORONARY ARTERY BYPASS GRAFTING (CABG), time four   using the left internal mammary artery, and right saphenous leg vein harvested endoscopically.;  Surgeon: Ivin Poot, MD;  Location: [place];  Service: Open Heart Surgery;  Laterality: N/A;   CYSTOSCOPY N/A 06/27/2017   Procedure: CYSTOSCOPY FLEXIBLE, Balloon dilatation placement of foley catheter;  Surgeon: Ivin Poot, MD;  Location: [place];  Service: Open Heart Surgery;  Laterality: N/A;   ENDARTERECTOMY Left 08/22/2017   Procedure: ENDARTERECTOMY CAROTID LEFT;  Surgeon: Serafina Mitchell, MD;  Location: [facility] OR;  Service: Vascular;  Laterality: Left;   INTRAVASCULAR ULTRASOUND/IVUS N/A 06/24/2017   Procedure: Intravascular Ultrasound/IVUS;  Surgeon: Belva Crome, MD;  Location: [place] CV LAB;  Service: Cardiovascular;  Laterality: N/A;  LEFT MAIN   LEFT HEART CATH AND CORONARY ANGIOGRAPHY N/A 06/24/2017   Procedure: LEFT HEART CATH AND CORONARY ANGIOGRAPHY;  Surgeon: Belva Crome, MD;  Location: [place] CV LAB;  Service: Cardiovascular;  Laterality: N/A;   PATCH ANGIOPLASTY Left 08/22/2017   Procedure: PATCH ANGIOPLASTY USING Rueben Bash BIOLOGIC PATCH;  Surgeon: Serafina Mitchell, MD;  Location: [place] OR;  Service: Vascular;  Laterality: Left;   PROSTATECTOMY     TEE WITHOUT CARDIOVERSION N/A 06/27/2017   Procedure: TRANSESOPHAGEAL ECHOCARDIOGRAM (TEE);  Surgeon: Prescott Gum, Collier Salina, MD;  Location: [place];  Service: Open Heart Surgery;   Laterality: N/A;   TONSILLECTOMY     ULTRASOUND GUIDANCE FOR VASCULAR ACCESS  06/24/2017   Procedure: Ultrasound Guidance For Vascular Access;  Surgeon: Belva Crome, MD;  Location: [place] CV LAB;  Service: Cardiovascular;;        Home Medications    Prior to Admission medications   Medication Sig Start Date End Date Taking? Authorizing Provider  ACCU-CHEK FASTCLIX LANCETS MISC 1 each by Does not apply route 2 (two) times daily. Dx:E11.9 11/26/17   Plotnikov, [name], MD  aspirin EC 81 MG tablet Take 1 tablet (81 mg total) by mouth daily. 09/11/18   Belva Crome, MD  Cholecalciferol (VITAMIN D3) 1000 UNITS CAPS Take 1,000 Units by mouth daily.     [provider]  furosemide (LASIX) 20 MG tablet Take 1 tablet (20 mg total) by mouth daily. 09/11/18 12/10/18  Belva Crome, MD  glimepiride (AMARYL) 2 MG tablet TAKE 1 TABLET BY MOUTH TWICE DAILY 09/14/18   Plotnikov, [name], MD  glucose monitoring kit (FREESTYLE) monitoring kit 1 each by Does not apply route as needed for other. Please dispense brand of choice per ins/patient. Dx: 250.00. 05/08/13   Plotnikov, [name], MD  losartan (COZAAR) 100 MG tablet Take 1 tablet by mouth once daily 10/20/18   Plotnikov, [name], MD  metFORMIN (GLUCOPHAGE) 1000 MG tablet Take 1,000 mg by mouth 2 (two) times daily with a meal.    [provider]  metoprolol succinate (TOPROL-XL) 25 MG 24 hr tablet Take 3 tablets (75 mg total) by mouth daily. Take with or immediately following a meal. 10/31/18   Belva Crome, MD  Multiple Vitamins-Minerals (MACULAR VITAMIN BENEFIT) TABS Take 1 tablet by mouth 2 (two) times daily.    [provider]  nitroGLYCERIN (NITROSTAT) 0.4 MG SL tablet Place 1 tablet (0.4 mg total) under the tongue every 5 (five) minutes as needed for chest pain. 06/18/17   Plotnikov, [name], MD  simvastatin (ZOCOR) 40 MG tablet Take 40 mg by mouth daily.    [provider]    Family History Family  History  Problem Relation Age of Onset   Heart disease Mother    Diabetes Other    Hypertension Other    Colon cancer Neg Hx     Social History Social History   Tobacco Use   Smoking status: Never Smoker   Smokeless tobacco: Never Used  Substance Use Topics   Alcohol use: No    Alcohol/week: 0.0 standard drinks   Drug use: No     Allergies   Amlodipine; Verapamil; Clindamycin hcl; and Contrast media [iodinated diagnostic agents]   Review of Systems Review of Systems  Unable to perform ROS: Acuity of condition     Physical Exam Updated [name] Signs BP (!) 177/96    Pulse (!) 119    Temp (!) 102.4 F (39.1 C) (Rectal)    Resp (!) 31    SpO2 92%   Physical Exam Vitals signs and nursing  note reviewed.  Constitutional:      General: He is not in acute distress.    Appearance: He is well-developed. He is obese.  HENT:     Head: Normocephalic and atraumatic.     Mouth/Throat:     Pharynx: No oropharyngeal exudate.  Eyes:     Conjunctiva/sclera: Conjunctivae normal.     Pupils: Pupils are equal, round, and reactive to light.  Neck:     Musculoskeletal: Normal range of motion and neck supple.     Comments: No meningismus. Cardiovascular:     Rate and Rhythm: Regular rhythm. Tachycardia present.     Heart sounds: Normal heart sounds. No murmur.  Pulmonary:     Effort: Pulmonary effort is normal. No respiratory distress.     Breath sounds: Rhonchi present.  Abdominal:     Palpations: Abdomen is soft.     Tenderness: There is no abdominal tenderness. There is no guarding or rebound.  Musculoskeletal: Normal range of motion.        General: No tenderness.  Skin:    General: Skin is warm.     Capillary Refill: Capillary refill takes less than 2 seconds.  Neurological:     Mental Status: He is alert. He is disoriented.     Cranial Nerves: Cranial nerve deficit present.     Motor: No abnormal muscle tone.     Coordination: Coordination normal.      Comments: Patient able to state his name.  Appears to have some receptive and expressive aphasia.  Right facial droop noted.  Able to hold up both arms against gravity.  Pronator drift present on the right. Symmetric strength in lower extremities Some difficulty following commands.      ED Treatments / Results  Labs (all labs ordered are listed, but only abnormal results are displayed) Labs Reviewed  CBC - Abnormal; Notable for the following components:      Result Value   WBC 15.9 (*)    All other components within normal limits  DIFFERENTIAL - Abnormal; Notable for the following components:   Neutro Abs 13.8 (*)    Abs Immature Granulocytes 0.09 (*)    All other components within normal limits  COMPREHENSIVE METABOLIC PANEL - Abnormal; Notable for the following components:   Glucose, Bld 325 (*)    Creatinine, Ser 1.64 (*)    GFR calc non Af Amer 40 (*)    GFR calc Af Amer 46 (*)    All other components within normal limits  URINALYSIS, ROUTINE W REFLEX MICROSCOPIC - Abnormal; Notable for the following components:   APPearance CLOUDY (*)    Glucose, UA >=500 (*)    Hgb urine dipstick MODERATE (*)    Ketones, ur 40 (*)    Protein, ur >300 (*)    All other components within normal limits  LACTIC ACID, PLASMA - Abnormal; Notable for the following components:   Lactic Acid, Venous 4.1 (*)    All other components within normal limits  LACTIC ACID, PLASMA - Abnormal; Notable for the following components:   Lactic Acid, Venous 5.9 (*)    All other components within normal limits  CSF CELL COUNT WITH DIFFERENTIAL - Abnormal; Notable for the following components:   RBC Count, CSF 27 (*)    All other components within normal limits  PROTEIN AND GLUCOSE, CSF - Abnormal; Notable for the following components:   Glucose, CSF 141 (*)    Total  Protein, CSF 52 (*)    All  other components within normal limits  URINALYSIS, MICROSCOPIC (REFLEX) - Abnormal; Notable for the following  components:   Bacteria, UA FEW (*)    All other components within normal limits  I-STAT CREATININE, ED - Abnormal; Notable for the following components:   Creatinine, Ser 1.40 (*)    All other components within normal limits  CBG MONITORING, ED - Abnormal; Notable for the following components:   Glucose-Capillary 290 (*)    All other components within normal limits  POCT I-STAT 7, (LYTES, BLD GAS, ICA,H+H) - Abnormal; Notable for the following components:   pH, Arterial 7.327 (*)    pO2, Arterial 73.0 (*)    Bicarbonate 18.4 (*)    TCO2 19 (*)    Acid-base deficit 6.0 (*)    Calcium, Ion 1.13 (*)    All other components within normal limits  CULTURE, BLOOD (ROUTINE X 2)  CSF CULTURE  CULTURE, BLOOD (ROUTINE X 2)  URINE CULTURE  NOVEL CORONAVIRUS, NAA (HOSPITAL ORDER, SEND-OUT TO REF LAB)  ETHANOL  PROTIME-INR  APTT  RAPID URINE DRUG SCREEN, HOSP PERFORMED  INFLUENZA PANEL BY PCR (TYPE A & B)  CK  LACTATE DEHYDROGENASE  HERPES SIMPLEX VIRUS(HSV) DNA BY PCR  STREP PNEUMONIAE URINARY ANTIGEN  LEGIONELLA PNEUMOPHILA SEROGP 1 UR AG  FERRITIN  C-REACTIVE PROTEIN  I-STAT ARTERIAL BLOOD GAS, ED    EKG EKG Interpretation  Date/Time:  Wednesday November 19 2018 02:31:29 EDT Ventricular Rate:  106 PR Interval:  188 QRS Duration: 156 QT Interval:  376 QTC Calculation: 499 R Axis:   133 Text Interpretation:  ** Poor data quality, interpretation may be adversely affected Sinus tachycardia Possible Left atrial enlargement Right bundle branch block Abnormal ECG Rate faster Confirmed by Ezequiel Essex [phone]) on 11/19/2018 3:37:41 AM   Radiology Ct Angio Head W Or Wo Contrast  Result Date: 11/19/2018 CLINICAL DATA:  Aphasia and confusion. Follow up code stroke. History of LEFT carotid endarterectomy, hypertension and diabetes. EXAM: CT ANGIOGRAPHY HEAD AND NECK CT PERFUSION BRAIN TECHNIQUE: Multidetector CT imaging of the head and neck was performed using the standard protocol during  bolus administration of intravenous contrast. Multiplanar CT image reconstructions and MIPs were obtained to evaluate the vascular anatomy. Carotid stenosis measurements (when applicable) are obtained utilizing NASCET criteria, using the distal internal carotid diameter as the denominator. Multiphase CT imaging of the brain was performed following IV bolus contrast injection. Subsequent parametric perfusion maps were calculated using RAPID software. CONTRAST:  112m OMNIPAQUE IOHEXOL 350 MG/ML SOLN COMPARISON:  CT HEAD November 19, 2018 and CT angiogram neck August 01, 2017 FINDINGS: CTA NECK FINDINGS: AORTIC ARCH: Normal appearance of the thoracic arch, 2 vessel arch is a normal variant. Mild calcific atherosclerosis and intimal thickening. The origins of the innominate, left Common carotid artery and subclavian artery are patent. RIGHT CAROTID SYSTEM: Common carotid artery is patent. Moderate calcific atherosclerosis of the carotid bifurcation without hemodynamically significant stenosis by NASCET criteria. Normal appearance of the internal carotid artery. LEFT CAROTID SYSTEM: Common carotid artery is patent, moderate intimal thickening and mild stenosis. Status post LEFT carotid endarterectomy. Within 1 cm of the origin is and 11 mm segment of 60% stenosis by NASCET criteria. VERTEBRAL ARTERIES:Codominant vertebral arteries. Moderate to severe bilateral V1 segment stenosis. SKELETON: No acute osseous process though bone windows have not been submitted. OTHER NECK: Soft tissues of the neck are nonacute though, not tailored for evaluation. UPPER CHEST: Small bilateral pleural effusions. Interlobular septal thickening, bronchial wall thickening and mosaic attenuation seen  with pulmonary edema. Status post median sternotomy. CTA HEAD FINDINGS: ANTERIOR CIRCULATION: Patent cervical internal carotid arteries, petrous, cavernous and supra clinoid internal carotid arteries. Calcific atherosclerosis resulting in moderate  stenosis bilateral carotid siphon. Patent anterior communicating artery. Patent anterior and middle cerebral arteries. Moderate stenosis RIGHT A2 segment, RIGHT M2 No large vessel occlusion, flow-limiting stenosis, contrast extravasation or aneurysm. POSTERIOR CIRCULATION: Patent vertebral arteries, vertebrobasilar junction and basilar artery, as well as main branch vessels. Calcific atherosclerosis resulting in moderate stenosis moderate stenosis distal RIGHT V4 segment. Patent posterior cerebral arteries, moderate luminal irregularity compatible with atherosclerosis. No large vessel occlusion, flow-limiting stenosis, contrast extravasation or aneurysm. VENOUS SINUSES: Major dural venous sinuses are patent though not tailored for evaluation on this angiographic examination. ANATOMIC VARIANTS: Hypoplastic LEFT A1 segment. DELAYED PHASE: Not performed. MIP images reviewed. CT Brain Perfusion Findings: CBF (<30%) Volume: 50m Perfusion (Tmax>6.0s) volume: 4241mMismatch Volume: 41m19mnfarction Location:LEFT occipital lobe. IMPRESSION: CTA NECK: 1. Status post LEFT carotid endarterectomy with 60% stenosis LEFT ICA by NASCET criteria. 2. Patent vertebral arteries. Moderate to severe stenosis bilateral V1 segments. 3. Small pleural effusions and findings of pulmonary edema. Recommend chest radiograph. CTA HEAD: 1. No emergent large vessel occlusion. 2. Moderate stenosis bilateral ICA. RIGHT vertebral artery, anterior, middle and posterior cerebral arteries multifocal moderate stenoses compatible with atherosclerosis. CT PERFUSION: 1. 4 cc LEFT occipital penumbra without core infarct. Critical Value/emergent results text paged to Dr.[facility]St[facility] AMION secure system on 11/19/2018 at 3:57 am, including interpreting physician's phone number. Electronically Signed   By: CouElon AlasD.   On: 11/19/2018 04:00   Ct Angio Neck W Or Wo Contrast  Result Date: 11/19/2018 CLINICAL DATA:  Aphasia and confusion. Follow  up code stroke. History of LEFT carotid endarterectomy, hypertension and diabetes. EXAM: CT ANGIOGRAPHY HEAD AND NECK CT PERFUSION BRAIN TECHNIQUE: Multidetector CT imaging of the head and neck was performed using the standard protocol during bolus administration of intravenous contrast. Multiplanar CT image reconstructions and MIPs were obtained to evaluate the vascular anatomy. Carotid stenosis measurements (when applicable) are obtained utilizing NASCET criteria, using the distal internal carotid diameter as the denominator. Multiphase CT imaging of the brain was performed following IV bolus contrast injection. Subsequent parametric perfusion maps were calculated using RAPID software. CONTRAST:  100m71mNIPAQUE IOHEXOL 350 MG/ML SOLN COMPARISON:  CT HEAD November 19, 2018 and CT angiogram neck August 01, 2017 FINDINGS: CTA NECK FINDINGS: AORTIC ARCH: Normal appearance of the thoracic arch, 2 vessel arch is a normal variant. Mild calcific atherosclerosis and intimal thickening. The origins of the innominate, left Common carotid artery and subclavian artery are patent. RIGHT CAROTID SYSTEM: Common carotid artery is patent. Moderate calcific atherosclerosis of the carotid bifurcation without hemodynamically significant stenosis by NASCET criteria. Normal appearance of the internal carotid artery. LEFT CAROTID SYSTEM: Common carotid artery is patent, moderate intimal thickening and mild stenosis. Status post LEFT carotid endarterectomy. Within 1 cm of the origin is and 11 mm segment of 60% stenosis by NASCET criteria. VERTEBRAL ARTERIES:Codominant vertebral arteries. Moderate to severe bilateral V1 segment stenosis. SKELETON: No acute osseous process though bone windows have not been submitted. OTHER NECK: Soft tissues of the neck are nonacute though, not tailored for evaluation. UPPER CHEST: Small bilateral pleural effusions. Interlobular septal thickening, bronchial wall thickening and mosaic attenuation seen with  pulmonary edema. Status post median sternotomy. CTA HEAD FINDINGS: ANTERIOR CIRCULATION: Patent cervical internal carotid arteries, petrous, cavernous and supra clinoid internal carotid arteries. Calcific atherosclerosis resulting in moderate  stenosis bilateral carotid siphon. Patent anterior communicating artery. Patent anterior and middle cerebral arteries. Moderate stenosis RIGHT A2 segment, RIGHT M2 No large vessel occlusion, flow-limiting stenosis, contrast extravasation or aneurysm. POSTERIOR CIRCULATION: Patent vertebral arteries, vertebrobasilar junction and basilar artery, as well as main branch vessels. Calcific atherosclerosis resulting in moderate stenosis moderate stenosis distal RIGHT V4 segment. Patent posterior cerebral arteries, moderate luminal irregularity compatible with atherosclerosis. No large vessel occlusion, flow-limiting stenosis, contrast extravasation or aneurysm. VENOUS SINUSES: Major dural venous sinuses are patent though not tailored for evaluation on this angiographic examination. ANATOMIC VARIANTS: Hypoplastic LEFT A1 segment. DELAYED PHASE: Not performed. MIP images reviewed. CT Brain Perfusion Findings: CBF (<30%) Volume: 29m Perfusion (Tmax>6.0s) volume: 439mMismatch Volume: 53m46mnfarction Location:LEFT occipital lobe. IMPRESSION: CTA NECK: 1. Status post LEFT carotid endarterectomy with 60% stenosis LEFT ICA by NASCET criteria. 2. Patent vertebral arteries. Moderate to severe stenosis bilateral V1 segments. 3. Small pleural effusions and findings of pulmonary edema. Recommend chest radiograph. CTA HEAD: 1. No emergent large vessel occlusion. 2. Moderate stenosis bilateral ICA. RIGHT vertebral artery, anterior, middle and posterior cerebral arteries multifocal moderate stenoses compatible with atherosclerosis. CT PERFUSION: 1. 4 cc LEFT occipital penumbra without core infarct. Critical Value/emergent results text paged to Dr.MC[facility]a AMION secure system on 11/19/2018  at 3:57 am, including interpreting physician's phone number. Electronically Signed   By: CouElon AlasD.   On: 11/19/2018 04:00   Ct Cerebral Perfusion W Contrast  Result Date: 11/19/2018 CLINICAL DATA:  Aphasia and confusion. Follow up code stroke. History of LEFT carotid endarterectomy, hypertension and diabetes. EXAM: CT ANGIOGRAPHY HEAD AND NECK CT PERFUSION BRAIN TECHNIQUE: Multidetector CT imaging of the head and neck was performed using the standard protocol during bolus administration of intravenous contrast. Multiplanar CT image reconstructions and MIPs were obtained to evaluate the vascular anatomy. Carotid stenosis measurements (when applicable) are obtained utilizing NASCET criteria, using the distal internal carotid diameter as the denominator. Multiphase CT imaging of the brain was performed following IV bolus contrast injection. Subsequent parametric perfusion maps were calculated using RAPID software. CONTRAST:  100m50mNIPAQUE IOHEXOL 350 MG/ML SOLN COMPARISON:  CT HEAD November 19, 2018 and CT angiogram neck August 01, 2017 FINDINGS: CTA NECK FINDINGS: AORTIC ARCH: Normal appearance of the thoracic arch, 2 vessel arch is a normal variant. Mild calcific atherosclerosis and intimal thickening. The origins of the innominate, left Common carotid artery and subclavian artery are patent. RIGHT CAROTID SYSTEM: Common carotid artery is patent. Moderate calcific atherosclerosis of the carotid bifurcation without hemodynamically significant stenosis by NASCET criteria. Normal appearance of the internal carotid artery. LEFT CAROTID SYSTEM: Common carotid artery is patent, moderate intimal thickening and mild stenosis. Status post LEFT carotid endarterectomy. Within 1 cm of the origin is and 11 mm segment of 60% stenosis by NASCET criteria. VERTEBRAL ARTERIES:Codominant vertebral arteries. Moderate to severe bilateral V1 segment stenosis. SKELETON: No acute osseous process though bone windows have not  been submitted. OTHER NECK: Soft tissues of the neck are nonacute though, not tailored for evaluation. UPPER CHEST: Small bilateral pleural effusions. Interlobular septal thickening, bronchial wall thickening and mosaic attenuation seen with pulmonary edema. Status post median sternotomy. CTA HEAD FINDINGS: ANTERIOR CIRCULATION: Patent cervical internal carotid arteries, petrous, cavernous and supra clinoid internal carotid arteries. Calcific atherosclerosis resulting in moderate stenosis bilateral carotid siphon. Patent anterior communicating artery. Patent anterior and middle cerebral arteries. Moderate stenosis RIGHT A2 segment, RIGHT M2 No large vessel occlusion, flow-limiting stenosis, contrast extravasation or aneurysm. POSTERIOR CIRCULATION:  Patent vertebral arteries, vertebrobasilar junction and basilar artery, as well as main branch vessels. Calcific atherosclerosis resulting in moderate stenosis moderate stenosis distal RIGHT V4 segment. Patent posterior cerebral arteries, moderate luminal irregularity compatible with atherosclerosis. No large vessel occlusion, flow-limiting stenosis, contrast extravasation or aneurysm. VENOUS SINUSES: Major dural venous sinuses are patent though not tailored for evaluation on this angiographic examination. ANATOMIC VARIANTS: Hypoplastic LEFT A1 segment. DELAYED PHASE: Not performed. MIP images reviewed. CT Brain Perfusion Findings: CBF (<30%) Volume: 69m Perfusion (Tmax>6.0s) volume: 466mMismatch Volume: 14m87mnfarction Location:LEFT occipital lobe. IMPRESSION: CTA NECK: 1. Status post LEFT carotid endarterectomy with 60% stenosis LEFT ICA by NASCET criteria. 2. Patent vertebral arteries. Moderate to severe stenosis bilateral V1 segments. 3. Small pleural effusions and findings of pulmonary edema. Recommend chest radiograph. CTA HEAD: 1. No emergent large vessel occlusion. 2. Moderate stenosis bilateral ICA. RIGHT vertebral artery, anterior, middle and posterior cerebral  arteries multifocal moderate stenoses compatible with atherosclerosis. CT PERFUSION: 1. 4 cc LEFT occipital penumbra without core infarct. Critical Value/emergent results text paged to Dr.MCNEILL KIRThe [facility]a AMION secure system on 11/19/2018 at 3:57 am, including interpreting physician's phone number. Electronically Signed   By: CouElon AlasD.   On: 11/19/2018 04:00   Ct Head Code Stroke Wo Contrast  Addendum Date: 11/19/2018   ADDENDUM REPORT: 11/19/2018 03:45 ADDENDUM: Critical Value/emergent results text paged to Dr.Valdez-Cordovaeurology via AMION secure system on 11/19/2018 at 3:35 am, including interpreting physician's phone number. Electronically Signed   By: CouElon AlasD.   On: 11/19/2018 03:45   Result Date: 11/19/2018 CLINICAL DATA:  Code stroke. Aphasia and confusion. History of LEFT carotid endarterectomy, hypertension and diabetes. EXAM: CT HEAD WITHOUT CONTRAST TECHNIQUE: Contiguous axial images were obtained from the base of the skull through the vertex without intravenous contrast. COMPARISON:  None. FINDINGS: BRAIN: No intraparenchymal hemorrhage, mass effect nor midline shift. No parenchymal brain volume loss for age. No hydrocephalus. Patchy supratentorial white matter hypodensities less than expected for patient's age, though non-specific are most compatible with chronic small vessel ischemic disease. Cystic RIGHT basal ganglia lacunar infarcts. No acute large vascular territory infarcts. No abnormal extra-axial fluid collections. Basal cisterns are patent. VASCULAR: Mild-to-moderate calcific atherosclerosis of the carotid siphons. SKULL: No skull fracture. No significant scalp soft tissue swelling. SINUSES/ORBITS: Trace paranasal sinus mucosal thickening. Mastoid air cells are well aerated.The included ocular globes and orbital contents are non-suspicious. Status post bilateral ocular lens implants. OTHER: None. ASPECTS ([facility] Early CT Score) - Ganglionic  level infarction (caudate, lentiform nuclei, internal capsule, insula, M1-M3 cortex): 7 - Supraganglionic infarction (M4-M6 cortex): 3 Total score (0-10 with 10 being normal): 10 IMPRESSION: 1. No acute intracranial process. 2. ASPECTS is 10. 3. 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 Electronically Signed: By: CouElon AlasD. On: 11/19/2018 03:36    Procedures .Lumbar Puncture Date/Time: 11/19/2018 5:29 AM Performed by: RanEzequiel EssexD Authorized by: RanEzequiel EssexD   Consent:    Consent obtained:  Verbal and emergent situation   Consent given by:  Spouse   Risks discussed:  Infection, headache, nerve damage, pain, repeat procedure and bleeding   Alternatives discussed:  Delayed treatment Pre-procedure details:    Procedure purpose:  Diagnostic   Preparation: Patient was prepped and draped in usual sterile fashion   Anesthesia (see MAR for exact dosages):    Anesthesia method:  Local infiltration   Local anesthetic:  Lidocaine 1% w/o epi Procedure details:  Lumbar space:  L4-L5 interspace   Patient position:  L lateral decubitus   Needle gauge:  22   Needle type:  Spinal needle - Quincke tip   Needle length (in):  2.5   Ultrasound guidance: no     Number of attempts:  3   Fluid appearance:  Clear   Tubes of fluid:  4   Total volume (ml):  10 Post-procedure:    Puncture site:  Adhesive bandage applied   Patient tolerance of procedure:  Tolerated well, no immediate complications   (including critical care time)  Medications Ordered in ED Medications - No data to display   Initial Impression / Assessment and Plan / ED Course  I have reviewed the triage [name] signs and the nursing notes.  Pertinent labs & imaging results that were available during my care of the patient were reviewed by me and considered in my medical decision making (see chart for details).       Patient from home with acute change in  mental status with aphasia, nausea, vomiting, and confusion.  Code stroke activated in triage.  Last seen normal at 7:30 PM.  CT head is negative for hemorrhage.  Does show old infarcts.  CTA obtained without large vessel occlusion. Small area of penumbra on perfusion study.  Seen at bedside with Dr. Leonel Ramsay.  Patient's family reports he was doing well all day yesterday had not been sick recently.  He started having nausea and vomiting after eating dinner last night and acting funny.  Code stroke activated in triage.  CT negative for hemorrhage.  Seen in CT with Dr. Leonel Ramsay and CTA and CT perfusion obtained.   Patient found to have new O2 oxygen requirement and fever to 102.4.  Does have leukocytosis. And lactic acidosis. Broad spectrum antibiotics started after cultures obtained.   CT imaging does show acute parietal infarct but is quite small and does not explain patient's hypoxia, fever and altered mental status.   Risks and benefits of lumbar puncture discussed with patient's wife by phone and she consents to the procedure.  Lumbar puncture performed with assistance of Dr. Leonel Ramsay of neurology.  Patient appears to have sepsis of unclear source with leukocytosis, lactic acidosis and hypoxia.  Chest x-ray is consistent with interstitial infiltrates.  However given patient's sepsis he will be treated with IV fluids and IV antibiotics after cultures were obtained.  He also be tested for influenza and coronavirus.  Airborne precautions were maintained. Chart review shows patient contacted his PCP on 3/24 after he was exposed to his boss who tested positive for COVID-19.  High suspicion for COVID-19 sepsis. Maintain airborne precautions.  Continues to maintain airway.  CSF results not consistent with meningitis.  Maintaining airway but remains delirious and requiring soft restraints.  Admission to ICU d.w Dr. Claudie Leach and NP [facility]. Wife updated via phone.  Will start CIWA  protocol as well given reported history of alcohol abuse   Larry [name] was evaluated in Emergency Department on 11/19/2018 for the symptoms described in the history of present illness. He was evaluated in the context of the global COVID-19 pandemic, which necessitated consideration that the patient might be at risk for infection with the SARS-CoV-2 virus that causes COVID-19. Institutional protocols and algorithms that pertain to the evaluation of patients at risk for COVID-19 are in a state of rapid change based on information released by regulatory bodies including the CDC and federal and state organizations. These policies and algorithms were followed during  the patient's care in the ED.   CRITICAL CARE Performed by: Ezequiel Essex Total critical care time: 90 minutes Critical care time was exclusive of separately billable procedures and treating other patients. Critical care was necessary to treat or prevent imminent or life-threatening deterioration. Critical care was time spent personally by me on the following activities: development of treatment plan with patient and/or surrogate as well as nursing, discussions with consultants, evaluation of patient's response to treatment, examination of patient, obtaining history from patient or surrogate, ordering and performing treatments and interventions, ordering and review of laboratory studies, ordering and review of radiographic studies, pulse oximetry and re-evaluation of patient's condition.   Final Clinical Impressions(s) / ED Diagnoses   Final diagnoses:  Acute ischemic stroke ([place])  Encephalopathy  Hypoxia  Sepsis with encephalopathy without septic shock, due to unspecified organism [facility])    ED Discharge Orders    None       Ezequiel Essex, MD 11/19/18 [phone]

## 2018-11-19 NOTE — Progress Notes (Signed)
Name: Larry [name] MRN: [id number] DOB: 1941/06/20 Cc: AMS   LOS: 0  [facility] Critical Care Note   History of Present Illness:  78 yo male with hx of HTN, HLD , DM, ALCOHOL abuse presenting with altered mental status and right facial droop. Last seen ok at 6 pm last night. He was a code stroke, CT's performed , no big stroke found and seen by Neurology. He then was seen febrile, still altered and LP was performed. Then noted to be hypoxic and requiring 4 L [place]. CXR with diffuse infiltrates. CCM consuLTED During our evaluation the patient is AAO3 but confused and acting strangely , he is protecting his airway and speaking in full sentences. Denies any cp , n/v/d.  Unable to complete full ROS given pts mental statust.  Lines / Drains:PIV   Cultures: pending   Antibiotics: azithromycin   Tests / Events: LP done in ED and code stroke      Vital Signs: Temp:  [102.4 F (39.1 C)-104.2 F (40.1 C)] 103.5 F (39.7 C) (04/01 1000) Pulse Rate:  [64-127] 78 (04/01 1040) Resp:  [16-31] 16 (04/01 1040) BP: (103-192)/(52-117) 105/72 (04/01 1040) SpO2:  [91 %-100 %] 100 % (04/01 1040) FiO2 (%):  [100 %] 100 % (04/01 1040) Weight:  [112.1 kg-112.2 kg] 112.1 kg (04/01 1000) No intake/output data recorded.  Physical Examination: General: 78 year old male who was neuro tendon prior to intubation HEENT: Endotracheal tube gastric tube in place Neuro: Currently sedated for intubation CV: Heart sounds are distant PULM: even/non-labored, lungs bilaterally coarse rhonchi bilaterally OY:DXAJ, non-tender, bsx4 active  Extremities: warm/dry, 1+ edema  Skin: no rashes or lesions    Ventilator settings:  Vent Mode: PRVC FiO2 (%):  [100 %] 100 % Set Rate:  [16 bmp] 16 bmp Vt Set:  [500 mL] 500 mL PEEP:  [18 cmH20] 18 cmH20 Plateau Pressure:  [20 cmH20] 20 cmH20  Labs    CBC Recent Labs  Lab 11/19/18 0320 11/19/18 0549  HGB 15.8 16.0  HCT 50.0 47.0  WBC 15.9*  --   PLT 324  --       BMET Recent Labs  Lab 11/19/18 0320 11/19/18 0402 11/19/18 0549  NA 137  --  137  K 4.6  --  4.2  CL 100  --   --   CO2 22  --   --   GLUCOSE 325*  --   --   BUN 21  --   --   CREATININE 1.64* 1.40*  --   CALCIUM 9.6  --   --     Recent Labs  Lab 11/19/18 0320  INR 1.0    Recent Labs  Lab 11/19/18 0549  PHART 7.327*  PCO2ART 36.1  PO2ART 73.0*  HCO3 18.4*  TCO2 19*  O2SAT 90.0     Radiology: CXR - diffuse infiltrates  Assessment and Plan: Principal Problem:   Acute respiratory failure with hypoxemia (HCC) Active Problems:   Acute encephalopathy   Close Exposure to Covid-19 Virus   Sepsis (HCC)  Vent dependent respiratory failure secondary inability to protect airway decreased level of consciousness suspected COVID-19 virus.  Questionable.  Community-acquired pneumonia. OSA Intubated 11/19/2018 for inability to protect airway Adjust ventilator per ABG Follow-up chest x-ray post intubation shows endotracheal tube at the level clavicular heads will advance 2 to 3 cm Serial chest x-ray Bronchodilators as needed Continue antimicrobial therapy  CHF/CAD -Cardiac monitoring  Altered mental status, presumed toxic metabolic encephalopathy, questionable EtOH abuse noted to  have a small CVA on work-up.  Neurology is following -Neurology's input appreciated -He is now sedated on full mechanical ventilatory support -Continue to monitor neuro status  AKI Lab Results  Component Value Date   CREATININE 1.40 (H) 11/19/2018   CREATININE 1.64 (H) 11/19/2018   CREATININE 1.34 (H) 10/31/2018   Recent Labs  Lab 11/19/18 0320 11/19/18 0549  K 4.6 4.2   -Monitor renal function -Serial electrolytes -Avoid nephrotoxins  DM Sliding scale insulin    App Critical Care time spent : 45 minutes Richardson Landry Shedrick Sarli ACNP Maryanna Shape PCCM Pager [phone] till 1 pm If no answer page [phone] 11/19/2018, 11:19 AM

## 2018-11-19 NOTE — ED Notes (Signed)
ED TO INPATIENT HANDOFF REPORT  ED Nurse Name and Phone #: [email or web address]/liz [phone]  S Name/Age/Gender Larry [name] 78 y.o. male Room/Bed: 019C/019C  Code Status   Code Status: Full Code  Home/SNF/Other Home Patient oriented to: none Is this baseline? No   Triage Complete: Triage complete  Chief Complaint stroke sx started around 6p  Triage Note Patient last seen normal around 6pm last night.  Patient with facial droop on the right, aphasic and confusion.  Patient is able to state his name. Patient with weakness on the right arm.  Patient taken to CT.   Allergies Allergies  Allergen Reactions  . Amlodipine Swelling    Leg swelling  . Verapamil Swelling    Edema w/high dose  . Clindamycin Hcl     UNSPECIFIED REACTION   . Contrast Media [Iodinated Diagnostic Agents] Nausea And Vomiting    Level of Care/Admitting Diagnosis ED Disposition    ED Disposition Condition [facility] Area: [place] [[id number]]  Level of Care: ICU [6]  Diagnosis: Sepsis [facility]) [[id number]]  Admitting Physician: Roxanne Mins [[id number]]  Attending Physician: Roxanne Mins [[id number]]  Estimated length of stay: 5 - 7 days  Certification:: I certify this patient will need inpatient services for at least 2 midnights  Bed request comments: 66M negative pressure room. COVID rule out  PT Class (Do Not Modify): Inpatient [101]  PT Acc Code (Do Not Modify): Private [1]       B Medical/Surgery History Past Medical History:  Diagnosis Date  . Carotid stenosis, left   . Cataracts, bilateral    removed  . Coronary artery disease   . Hyperlipemia   . Prostate CA ([facility])   . Sleep apnea    Dr Ledora Bottcher ; uses CPAP  . Type II or unspecified type diabetes mellitus without mention of complication, not stated as uncontrolled   . Unspecified essential hypertension    Past Surgical History:  Procedure Laterality Date  . APPENDECTOMY    . CARDIAC CATHETERIZATION    .  CATARACT EXTRACTION, BILATERAL    . CORONARY ARTERY BYPASS GRAFT N/A 06/27/2017   Procedure: CORONARY ARTERY BYPASS GRAFTING (CABG), time four   using the left internal mammary artery, and right saphenous leg vein harvested endoscopically.;  Surgeon: Ivin Poot, MD;  Location: [place];  Service: Open Heart Surgery;  Laterality: N/A;  . CYSTOSCOPY N/A 06/27/2017   Procedure: CYSTOSCOPY FLEXIBLE, Balloon dilatation placement of foley catheter;  Surgeon: Ivin Poot, MD;  Location: [place];  Service: Open Heart Surgery;  Laterality: N/A;  . ENDARTERECTOMY Left 08/22/2017   Procedure: ENDARTERECTOMY CAROTID LEFT;  Surgeon: Serafina Mitchell, MD;  Location: [place];  Service: Vascular;  Laterality: Left;  . INTRAVASCULAR ULTRASOUND/IVUS N/A 06/24/2017   Procedure: Intravascular Ultrasound/IVUS;  Surgeon: Belva Crome, MD;  Location: [place] [facility];  Service: Cardiovascular;  Laterality: N/A;  LEFT MAIN  . LEFT HEART CATH AND CORONARY ANGIOGRAPHY N/A 06/24/2017   Procedure: LEFT HEART CATH AND CORONARY ANGIOGRAPHY;  Surgeon: Belva Crome, MD;  Location: [place] [facility];  Service: Cardiovascular;  Laterality: N/A;  . PATCH ANGIOPLASTY Left 08/22/2017   Procedure: PATCH ANGIOPLASTY USING Rueben Bash BIOLOGIC PATCH;  Surgeon: Serafina Mitchell, MD;  Location: [place];  Service: Vascular;  Laterality: Left;  . PROSTATECTOMY    . TEE WITHOUT CARDIOVERSION N/A 06/27/2017   Procedure: TRANSESOPHAGEAL ECHOCARDIOGRAM (TEE);  Surgeon: Prescott Gum, Collier Salina, MD;  Location: [facility]  OR;  Service: Open Heart Surgery;  Laterality: N/A;  . TONSILLECTOMY    . ULTRASOUND GUIDANCE FOR VASCULAR ACCESS  06/24/2017   Procedure: Ultrasound Guidance For Vascular Access;  Surgeon: Belva Crome, MD;  Location: [place] [facility];  Service: Cardiovascular;;     A IV Location/Drains/Wounds Patient Lines/Drains/Airways Status   Active Line/Drains/Airways    Name:   Placement date:   Placement time:   Site:   Days:   Peripheral IV  11/19/18 Right Antecubital   11/19/18    0412    Antecubital   less than 1   Peripheral IV 11/19/18 Left Antecubital   11/19/18    0343    Antecubital   less than 1   Urethral Catheter Ben, RN Temperature probe 16 Fr.   11/19/18    0456    Temperature probe   less than 1   Incision (Closed) 06/27/17 Sternum Anterior;Mid   06/27/17    1434     510   Incision (Closed) 06/27/17 Leg Right   06/27/17    1434     510          Intake/Output Last 24 hours No intake or output data in the 24 hours ending 11/19/18 0730  Labs/Imaging Results for orders placed or performed during the hospital encounter of 11/19/18 (from the past 48 hour(s))  CBG monitoring, ED     Status: Abnormal   Collection Time: 11/19/18  3:16 AM  Result Value Ref Range   Glucose-Capillary 290 (H) 70 - 99 mg/dL  Ethanol     Status: None   Collection Time: 11/19/18  3:20 AM  Result Value Ref Range   Alcohol, Ethyl (B) <10 <10 mg/dL    Comment: (NOTE) Lowest detectable limit for serum alcohol is 10 mg/dL. For medical purposes only. Performed at [facility], [place] [address] [place], [place] [address]   Protime-INR     Status: None   Collection Time: 11/19/18  3:20 AM  Result Value Ref Range   Prothrombin Time 13.1 11.4 - 15.2 seconds   INR 1.0 0.8 - 1.2    Comment: (NOTE) INR goal varies based on device and disease states. Performed at [facility], [place] [address], [place], [place] [address]   APTT     Status: None   Collection Time: 11/19/18  3:20 AM  Result Value Ref Range   aPTT 25 24 - 36 seconds    Comment: Performed at [facility] [address], [place], [place] [address]  CBC     Status: Abnormal   Collection Time: 11/19/18  3:20 AM  Result Value Ref Range   WBC 15.9 (H) 4.0 - 10.5 K/uL   RBC 5.43 4.22 - 5.81 MIL/uL   Hemoglobin 15.8 13.0 - 17.0 g/dL   HCT 50.0 39.0 - 52.0 %   MCV 92.1 80.0 - 100.0 fL   MCH 29.1 26.0 - 34.0 pg   MCHC 31.6 30.0 - 36.0 g/dL   RDW 11.9 11.5 - 15.5 %    Platelets 324 150 - 400 K/uL   nRBC 0.0 0.0 - 0.2 %    Comment: Performed at [facility], [place] [address], [place],  [address]  Differential     Status: Abnormal   Collection Time: 11/19/18  3:20 AM  Result Value Ref Range   Neutrophils Relative % 86 %   Neutro Abs 13.8 (H) 1.7 - 7.7 K/uL   Lymphocytes Relative  9 %   Lymphs Abs 1.4 0.7 - 4.0 K/uL   Monocytes Relative 3 %   Monocytes Absolute 0.5 0.1 - 1.0 K/uL   Eosinophils Relative 0 %   Eosinophils Absolute 0.0 0.0 - 0.5 K/uL   Basophils Relative 1 %   Basophils Absolute 0.1 0.0 - 0.1 K/uL   Immature Granulocytes 1 %   Abs Immature Granulocytes 0.09 (H) 0.00 - 0.07 K/uL    Comment: Performed at [facility] [address] [place], [place] [address]  Comprehensive metabolic panel     Status: Abnormal   Collection Time: 11/19/18  3:20 AM  Result Value Ref Range   Sodium 137 135 - 145 mmol/L   Potassium 4.6 3.5 - 5.1 mmol/L   Chloride 100 98 - 111 mmol/L   CO2 22 22 - 32 mmol/L   Glucose, Bld 325 (H) 70 - 99 mg/dL   BUN 21 8 - 23 mg/dL   Creatinine, Ser 1.64 (H) 0.61 - 1.24 mg/dL   Calcium 9.6 8.9 - 10.3 mg/dL   Total Protein 7.3 6.5 - 8.1 g/dL   Albumin 3.9 3.5 - 5.0 g/dL   AST 26 15 - 41 U/L   ALT 23 0 - 44 U/L   Alkaline Phosphatase 79 38 - 126 U/L   Total Bilirubin 1.1 0.3 - 1.2 mg/dL   GFR calc non Af Amer 40 (L) >60 mL/min   GFR calc Af Amer 46 (L) >60 mL/min   Anion gap 15 5 - 15    Comment: Performed at [facility] [address] [place], [place] [address]  I-stat Creatinine, ED     Status: Abnormal   Collection Time: 11/19/18  4:02 AM  Result Value Ref Range   Creatinine, Ser 1.40 (H) 0.61 - 1.24 mg/dL  Blood culture (routine x 2)     Status: None (Preliminary result)   Collection Time: 11/19/18  4:10 AM  Result Value Ref Range   Specimen Description BLOOD LEFT ARM    Special Requests      BOTTLES DRAWN AEROBIC ONLY Blood Culture results may not be optimal due to an inadequate  volume of blood received in culture bottles Performed at [facility] [address] [place], [place] [address]    Culture PENDING    Report Status PENDING   Lactic acid, plasma     Status: Abnormal   Collection Time: 11/19/18  4:13 AM  Result Value Ref Range   Lactic Acid, Venous 4.1 (HH) 0.5 - 1.9 mmol/L    Comment: CRITICAL RESULT CALLED TO, READ BACK BY AND VERIFIED WITH: T.JORDAN,RN 0443 11/19/2018 M.CAMPBELL Performed at [facility], [place] [address], [place], [place] [address]   Influenza panel by PCR (type A & B)     Status: None   Collection Time: 11/19/18  4:23 AM  Result Value Ref Range   Influenza A By PCR NEGATIVE NEGATIVE   Influenza B By PCR NEGATIVE NEGATIVE    Comment: (NOTE) The Xpert Xpress Flu assay is intended as an aid in the diagnosis of  influenza and should not be used as a sole basis for treatment.  This  assay is FDA approved for nasopharyngeal swab specimens only. Nasal  washings and aspirates are unacceptable for Xpert Xpress Flu testing. Performed at [facility], [place] [address], [place], [place] [address]   CSF cell count with differential collection tube #: 4     Status: Abnormal  Collection Time: 11/19/18  4:48 AM  Result Value Ref Range   Tube # 4    Color, CSF COLORLESS COLORLESS   Appearance, CSF CLEAR CLEAR   Supernatant NOT INDICATED    RBC Count, CSF 27 (H) 0 /cu mm   WBC, CSF 2 0 - 5 /cu mm   Other Cells, CSF TOO FEW TO COUNT, SMEAR AVAILABLE FOR REVIEW     Comment: RARE LYMPHOCYTES, RARE NEUTROPHILS, RARE MONOCYTES Performed at [facility] [address], [place], [place] [address]   CSF culture with Stat gram stain     Status: None (Preliminary result)   Collection Time: 11/19/18  4:48 AM  Result Value Ref Range   Specimen Description CSF    Special Requests TUBE 2    Gram Stain      NO WBC SEEN NO ORGANISMS SEEN CYTOSPIN SMEAR Performed at [facility], [place] [address], [place], [place]  [address]    Culture PENDING    Report Status PENDING   Protein and glucose, CSF     Status: Abnormal   Collection Time: 11/19/18  4:48 AM  Result Value Ref Range   Glucose, CSF 141 (H) 40 - 70 mg/dL   Total  Protein, CSF 52 (H) 15 - 45 mg/dL    Comment: Performed at [facility] [address] [place], [place] [address]  Urine rapid drug screen (hosp performed)     Status: None   Collection Time: 11/19/18  4:59 AM  Result Value Ref Range   Opiates NONE DETECTED NONE DETECTED   Cocaine NONE DETECTED NONE DETECTED   Benzodiazepines NONE DETECTED NONE DETECTED   Amphetamines NONE DETECTED NONE DETECTED   Tetrahydrocannabinol NONE DETECTED NONE DETECTED   Barbiturates NONE DETECTED NONE DETECTED    Comment: (NOTE) DRUG SCREEN FOR MEDICAL PURPOSES ONLY.  IF CONFIRMATION IS NEEDED FOR ANY PURPOSE, NOTIFY LAB WITHIN 5 DAYS. LOWEST DETECTABLE LIMITS FOR URINE DRUG SCREEN Drug Class                     Cutoff (ng/mL) Amphetamine and metabolites    1000 Barbiturate and metabolites    200 Benzodiazepine                 209 Tricyclics and metabolites     300 Opiates and metabolites        300 Cocaine and metabolites        300 THC                            50 Performed at [facility], [place] [address] [place], [place] [address]   Urinalysis, Routine w reflex microscopic     Status: Abnormal   Collection Time: 11/19/18  4:59 AM  Result Value Ref Range   Color, Urine YELLOW YELLOW   APPearance CLOUDY (A) CLEAR   Specific Gravity, Urine 1.010 1.005 - 1.030   pH 5.0 5.0 - 8.0   Glucose, UA >=500 (A) NEGATIVE mg/dL   Hgb urine dipstick MODERATE (A) NEGATIVE   Bilirubin Urine NEGATIVE NEGATIVE   Ketones, ur 40 (A) NEGATIVE mg/dL   Protein, ur >300 (A) NEGATIVE mg/dL   Nitrite NEGATIVE NEGATIVE   Leukocytes,Ua NEGATIVE NEGATIVE    Comment: Performed at [facility] [address], [place], [place] [address]  Urinalysis, Microscopic (reflex)     Status: Abnormal    Collection  Time: 11/19/18  4:59 AM  Result Value Ref Range   RBC / HPF 0-5 0 - 5 RBC/hpf   WBC, UA NONE SEEN 0 - 5 WBC/hpf   Bacteria, UA FEW (A) NONE SEEN   Squamous Epithelial / LPF 0-5 0 - 5   Amorphous Crystal PRESENT     Comment: Performed at [facility], [place] [address], [place], [place] [address]  I-STAT 7, (LYTES, BLD GAS, ICA, H+H)     Status: Abnormal   Collection Time: 11/19/18  5:49 AM  Result Value Ref Range   pH, Arterial 7.327 (L) 7.350 - 7.450   pCO2 arterial 36.1 32.0 - 48.0 mmHg   pO2, Arterial 73.0 (L) 83.0 - 108.0 mmHg   Bicarbonate 18.4 (L) 20.0 - 28.0 mmol/L   TCO2 19 (L) 22 - 32 mmol/L   O2 Saturation 90.0 %   Acid-base deficit 6.0 (H) 0.0 - 2.0 mmol/L   Sodium 137 135 - 145 mmol/L   Potassium 4.2 3.5 - 5.1 mmol/L   Calcium, Ion 1.13 (L) 1.15 - 1.40 mmol/L   HCT 47.0 39.0 - 52.0 %   Hemoglobin 16.0 13.0 - 17.0 g/dL   Patient temperature 103.5 F    Collection site RADIAL, ALLEN'S TEST ACCEPTABLE    Drawn by RT    Sample type ARTERIAL    Ct Angio Head W Or Wo Contrast  Result Date: 11/19/2018 CLINICAL DATA:  Aphasia and confusion. Follow up code stroke. History of LEFT carotid endarterectomy, hypertension and diabetes. EXAM: CT ANGIOGRAPHY HEAD AND NECK CT PERFUSION BRAIN TECHNIQUE: Multidetector CT imaging of the head and neck was performed using the standard protocol during bolus administration of intravenous contrast. Multiplanar CT image reconstructions and MIPs were obtained to evaluate the vascular anatomy. Carotid stenosis measurements (when applicable) are obtained utilizing NASCET criteria, using the distal internal carotid diameter as the denominator. Multiphase CT imaging of the brain was performed following IV bolus contrast injection. Subsequent parametric perfusion maps were calculated using RAPID software. CONTRAST:  152mL OMNIPAQUE IOHEXOL 350 MG/ML SOLN COMPARISON:  CT HEAD November 19, 2018 and CT angiogram neck August 01, 2017 FINDINGS: CTA  NECK FINDINGS: AORTIC ARCH: Normal appearance of the thoracic arch, 2 vessel arch is a normal variant. Mild calcific atherosclerosis and intimal thickening. The origins of the innominate, left Common carotid artery and subclavian artery are patent. RIGHT CAROTID SYSTEM: Common carotid artery is patent. Moderate calcific atherosclerosis of the carotid bifurcation without hemodynamically significant stenosis by NASCET criteria. Normal appearance of the internal carotid artery. LEFT CAROTID SYSTEM: Common carotid artery is patent, moderate intimal thickening and mild stenosis. Status post LEFT carotid endarterectomy. Within 1 cm of the origin is and 11 mm segment of 60% stenosis by NASCET criteria. VERTEBRAL ARTERIES:Codominant vertebral arteries. Moderate to severe bilateral V1 segment stenosis. SKELETON: No acute osseous process though bone windows have not been submitted. OTHER NECK: Soft tissues of the neck are nonacute though, not tailored for evaluation. UPPER CHEST: Small bilateral pleural effusions. Interlobular septal thickening, bronchial wall thickening and mosaic attenuation seen with pulmonary edema. Status post median sternotomy. CTA HEAD FINDINGS: ANTERIOR CIRCULATION: Patent cervical internal carotid arteries, petrous, cavernous and supra clinoid internal carotid arteries. Calcific atherosclerosis resulting in moderate stenosis bilateral carotid siphon. Patent anterior communicating artery. Patent anterior and middle cerebral arteries. Moderate stenosis RIGHT A2 segment, RIGHT M2 No large vessel occlusion, flow-limiting stenosis, contrast extravasation or aneurysm. POSTERIOR CIRCULATION: Patent vertebral arteries, vertebrobasilar junction and basilar artery, as well as main  branch vessels. Calcific atherosclerosis resulting in moderate stenosis moderate stenosis distal RIGHT V4 segment. Patent posterior cerebral arteries, moderate luminal irregularity compatible with atherosclerosis. No large vessel  occlusion, flow-limiting stenosis, contrast extravasation or aneurysm. VENOUS SINUSES: Major dural venous sinuses are patent though not tailored for evaluation on this angiographic examination. ANATOMIC VARIANTS: Hypoplastic LEFT A1 segment. DELAYED PHASE: Not performed. MIP images reviewed. CT Brain Perfusion Findings: CBF (<30%) Volume: 79mL Perfusion (Tmax>6.0s) volume: 43mL Mismatch Volume: 47mL Infarction Location:LEFT occipital lobe. IMPRESSION: CTA NECK: 1. Status post LEFT carotid endarterectomy with 60% stenosis LEFT ICA by NASCET criteria. 2. Patent vertebral arteries. Moderate to severe stenosis bilateral V1 segments. 3. Small pleural effusions and findings of pulmonary edema. Recommend chest radiograph. CTA HEAD: 1. No emergent large vessel occlusion. 2. Moderate stenosis bilateral ICA. RIGHT vertebral artery, anterior, middle and posterior cerebral arteries multifocal moderate stenoses compatible with atherosclerosis. CT PERFUSION: 1. 4 cc LEFT occipital penumbra without core infarct. Critical Value/emergent results text paged to Dr.MCNEILL [facility] via AMION secure system on 11/19/2018 at 3:57 am, including interpreting physician's phone number. Electronically Signed   By: Elon Alas M.D.   On: 11/19/2018 04:00   Ct Angio Neck W Or Wo Contrast  Result Date: 11/19/2018 CLINICAL DATA:  Aphasia and confusion. Follow up code stroke. History of LEFT carotid endarterectomy, hypertension and diabetes. EXAM: CT ANGIOGRAPHY HEAD AND NECK CT PERFUSION BRAIN TECHNIQUE: Multidetector CT imaging of the head and neck was performed using the standard protocol during bolus administration of intravenous contrast. Multiplanar CT image reconstructions and MIPs were obtained to evaluate the vascular anatomy. Carotid stenosis measurements (when applicable) are obtained utilizing NASCET criteria, using the distal internal carotid diameter as the denominator. Multiphase CT imaging of the brain was performed following  IV bolus contrast injection. Subsequent parametric perfusion maps were calculated using RAPID software. CONTRAST:  150mL OMNIPAQUE IOHEXOL 350 MG/ML SOLN COMPARISON:  CT HEAD November 19, 2018 and CT angiogram neck August 01, 2017 FINDINGS: CTA NECK FINDINGS: AORTIC ARCH: Normal appearance of the thoracic arch, 2 vessel arch is a normal variant. Mild calcific atherosclerosis and intimal thickening. The origins of the innominate, left Common carotid artery and subclavian artery are patent. RIGHT CAROTID SYSTEM: Common carotid artery is patent. Moderate calcific atherosclerosis of the carotid bifurcation without hemodynamically significant stenosis by NASCET criteria. Normal appearance of the internal carotid artery. LEFT CAROTID SYSTEM: Common carotid artery is patent, moderate intimal thickening and mild stenosis. Status post LEFT carotid endarterectomy. Within 1 cm of the origin is and 11 mm segment of 60% stenosis by NASCET criteria. VERTEBRAL ARTERIES:Codominant vertebral arteries. Moderate to severe bilateral V1 segment stenosis. SKELETON: No acute osseous process though bone windows have not been submitted. OTHER NECK: Soft tissues of the neck are nonacute though, not tailored for evaluation. UPPER CHEST: Small bilateral pleural effusions. Interlobular septal thickening, bronchial wall thickening and mosaic attenuation seen with pulmonary edema. Status post median sternotomy. CTA HEAD FINDINGS: ANTERIOR CIRCULATION: Patent cervical internal carotid arteries, petrous, cavernous and supra clinoid internal carotid arteries. Calcific atherosclerosis resulting in moderate stenosis bilateral carotid siphon. Patent anterior communicating artery. Patent anterior and middle cerebral arteries. Moderate stenosis RIGHT A2 segment, RIGHT M2 No large vessel occlusion, flow-limiting stenosis, contrast extravasation or aneurysm. POSTERIOR CIRCULATION: Patent vertebral arteries, vertebrobasilar junction and basilar artery, as  well as main branch vessels. Calcific atherosclerosis resulting in moderate stenosis moderate stenosis distal RIGHT V4 segment. Patent posterior cerebral arteries, moderate luminal irregularity compatible with atherosclerosis. No large vessel occlusion, flow-limiting stenosis,  contrast extravasation or aneurysm. VENOUS SINUSES: Major dural venous sinuses are patent though not tailored for evaluation on this angiographic examination. ANATOMIC VARIANTS: Hypoplastic LEFT A1 segment. DELAYED PHASE: Not performed. MIP images reviewed. CT Brain Perfusion Findings: CBF (<30%) Volume: 58mL Perfusion (Tmax>6.0s) volume: 36mL Mismatch Volume: 8mL Infarction Location:LEFT occipital lobe. IMPRESSION: CTA NECK: 1. Status post LEFT carotid endarterectomy with 60% stenosis LEFT ICA by NASCET criteria. 2. Patent vertebral arteries. Moderate to severe stenosis bilateral V1 segments. 3. Small pleural effusions and findings of pulmonary edema. Recommend chest radiograph. CTA HEAD: 1. No emergent large vessel occlusion. 2. Moderate stenosis bilateral ICA. RIGHT vertebral artery, anterior, middle and posterior cerebral arteries multifocal moderate stenoses compatible with atherosclerosis. CT PERFUSION: 1. 4 cc LEFT occipital penumbra without core infarct. Critical Value/emergent results text paged to Dr.MCNEILL [facility] via AMION secure system on 11/19/2018 at 3:57 am, including interpreting physician's phone number. Electronically Signed   By: Elon Alas M.D.   On: 11/19/2018 04:00   Ct Cerebral Perfusion W Contrast  Result Date: 11/19/2018 CLINICAL DATA:  Aphasia and confusion. Follow up code stroke. History of LEFT carotid endarterectomy, hypertension and diabetes. EXAM: CT ANGIOGRAPHY HEAD AND NECK CT PERFUSION BRAIN TECHNIQUE: Multidetector CT imaging of the head and neck was performed using the standard protocol during bolus administration of intravenous contrast. Multiplanar CT image reconstructions and MIPs were  obtained to evaluate the vascular anatomy. Carotid stenosis measurements (when applicable) are obtained utilizing NASCET criteria, using the distal internal carotid diameter as the denominator. Multiphase CT imaging of the brain was performed following IV bolus contrast injection. Subsequent parametric perfusion maps were calculated using RAPID software. CONTRAST:  114mL OMNIPAQUE IOHEXOL 350 MG/ML SOLN COMPARISON:  CT HEAD November 19, 2018 and CT angiogram neck August 01, 2017 FINDINGS: CTA NECK FINDINGS: AORTIC ARCH: Normal appearance of the thoracic arch, 2 vessel arch is a normal variant. Mild calcific atherosclerosis and intimal thickening. The origins of the innominate, left Common carotid artery and subclavian artery are patent. RIGHT CAROTID SYSTEM: Common carotid artery is patent. Moderate calcific atherosclerosis of the carotid bifurcation without hemodynamically significant stenosis by NASCET criteria. Normal appearance of the internal carotid artery. LEFT CAROTID SYSTEM: Common carotid artery is patent, moderate intimal thickening and mild stenosis. Status post LEFT carotid endarterectomy. Within 1 cm of the origin is and 11 mm segment of 60% stenosis by NASCET criteria. VERTEBRAL ARTERIES:Codominant vertebral arteries. Moderate to severe bilateral V1 segment stenosis. SKELETON: No acute osseous process though bone windows have not been submitted. OTHER NECK: Soft tissues of the neck are nonacute though, not tailored for evaluation. UPPER CHEST: Small bilateral pleural effusions. Interlobular septal thickening, bronchial wall thickening and mosaic attenuation seen with pulmonary edema. Status post median sternotomy. CTA HEAD FINDINGS: ANTERIOR CIRCULATION: Patent cervical internal carotid arteries, petrous, cavernous and supra clinoid internal carotid arteries. Calcific atherosclerosis resulting in moderate stenosis bilateral carotid siphon. Patent anterior communicating artery. Patent anterior and middle  cerebral arteries. Moderate stenosis RIGHT A2 segment, RIGHT M2 No large vessel occlusion, flow-limiting stenosis, contrast extravasation or aneurysm. POSTERIOR CIRCULATION: Patent vertebral arteries, vertebrobasilar junction and basilar artery, as well as main branch vessels. Calcific atherosclerosis resulting in moderate stenosis moderate stenosis distal RIGHT V4 segment. Patent posterior cerebral arteries, moderate luminal irregularity compatible with atherosclerosis. No large vessel occlusion, flow-limiting stenosis, contrast extravasation or aneurysm. VENOUS SINUSES: Major dural venous sinuses are patent though not tailored for evaluation on this angiographic examination. ANATOMIC VARIANTS: Hypoplastic LEFT A1 segment. DELAYED PHASE: Not performed. MIP images  reviewed. CT Brain Perfusion Findings: CBF (<30%) Volume: 23mL Perfusion (Tmax>6.0s) volume: 55mL Mismatch Volume: 75mL Infarction Location:LEFT occipital lobe. IMPRESSION: CTA NECK: 1. Status post LEFT carotid endarterectomy with 60% stenosis LEFT ICA by NASCET criteria. 2. Patent vertebral arteries. Moderate to severe stenosis bilateral V1 segments. 3. Small pleural effusions and findings of pulmonary edema. Recommend chest radiograph. CTA HEAD: 1. No emergent large vessel occlusion. 2. Moderate stenosis bilateral ICA. RIGHT vertebral artery, anterior, middle and posterior cerebral arteries multifocal moderate stenoses compatible with atherosclerosis. CT PERFUSION: 1. 4 cc LEFT occipital penumbra without core infarct. Critical Value/emergent results text paged to Dr.MCNEILL [facility] via [employer] secure system on 11/19/2018 at 3:57 am, including interpreting physician's phone number. Electronically Signed   By: Elon Alas M.D.   On: 11/19/2018 04:00   Dg Chest Portable 1 View  Result Date: 11/19/2018 CLINICAL DATA:  Hypoxia.  Facial droop EXAM: PORTABLE CHEST 1 VIEW COMPARISON:  08/25/2017 FINDINGS: Diffuse interstitial coarsening with Kerley lines.  Cardiomegaly. Prior CABG. There is pleural fluid by preceding neck CTA. IMPRESSION: CHF pattern. Electronically Signed   By: Monte Fantasia M.D.   On: 11/19/2018 04:14   Ct Head Code Stroke Wo Contrast  Addendum Date: 11/19/2018   ADDENDUM REPORT: 11/19/2018 03:45 ADDENDUM: Critical Value/emergent results text paged to [place], Neurology via AMION secure system on 11/19/2018 at 3:35 am, including interpreting physician's phone number. Electronically Signed   By: Elon Alas M.D.   On: 11/19/2018 03:45   Result Date: 11/19/2018 CLINICAL DATA:  Code stroke. Aphasia and confusion. History of LEFT carotid endarterectomy, hypertension and diabetes. EXAM: CT HEAD WITHOUT CONTRAST TECHNIQUE: Contiguous axial images were obtained from the base of the skull through the vertex without intravenous contrast. COMPARISON:  None. FINDINGS: BRAIN: No intraparenchymal hemorrhage, mass effect nor midline shift. No parenchymal brain volume loss for age. No hydrocephalus. Patchy supratentorial white matter hypodensities less than expected for patient's age, though non-specific are most compatible with chronic small vessel ischemic disease. Cystic RIGHT basal ganglia lacunar infarcts. No acute large vascular territory infarcts. No abnormal extra-axial fluid collections. Basal cisterns are patent. VASCULAR: Mild-to-moderate calcific atherosclerosis of the carotid siphons. SKULL: No skull fracture. No significant scalp soft tissue swelling. SINUSES/ORBITS: Trace paranasal sinus mucosal thickening. Mastoid air cells are well aerated.The included ocular globes and orbital contents are non-suspicious. Status post bilateral ocular lens implants. OTHER: None. ASPECTS [facility] Stroke Program Early CT Score) - Ganglionic level infarction (caudate, lentiform nuclei, internal capsule, insula, M1-M3 cortex): 7 - Supraganglionic infarction (M4-M6 cortex): 3 Total score (0-10 with 10 being normal): 10 IMPRESSION: 1. No acute  intracranial process. 2. ASPECTS is 10. 3. 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 Electronically Signed: By: Elon Alas M.D. On: 11/19/2018 03:36    Pending Labs Unresulted Labs (From admission, onward)    Start     Ordered   11/20/18 0500  CBC  Tomorrow morning,   R     11/19/18 0705   11/20/18 3500  Basic metabolic panel  Tomorrow morning,   R     11/19/18 0705   11/20/18 0500  Magnesium  Tomorrow morning,   R     11/19/18 0705   11/20/18 0500  Phosphorus  Tomorrow morning,   R     11/19/18 0705   11/19/18 0707  Ferritin  Once,   R     11/19/18 0708   11/19/18 0707  Lactate dehydrogenase  Once,   R  11/19/18 0708   11/19/18 0707  C-reactive protein  Once,   R     11/19/18 0708   11/19/18 0706  Strep pneumoniae urinary antigen  (not at [facility])  Once,   R     11/19/18 0705   11/19/18 0706  Legionella Pneumophila Serogp 1 Ur Ag  Once,   R     11/19/18 0705   11/19/18 0605  CK  Add-on,   STAT     11/19/18 0604   11/19/18 0525  Herpes simplex virus (HSV), DNA by PCR Cerebrospinal Fluid  Once,   R     11/19/18 0525   11/19/18 0421  Novel Coronavirus, NAA (hospital order; send-out to ref lab)  (Novel Coronavirus, NAA [facility] Order; send-out to ref lab) with precautions panel)  Once,   R    Question Answer Comment  Current symptoms Fever and Cough   Excluded other viral illnesses Yes   Exposure Risk None   Patient immune status Normal      11/19/18 0421   11/19/18 0420  Urine culture  ONCE - STAT,   STAT     11/19/18 0419   11/19/18 0354  Blood culture (routine x 2)  BLOOD CULTURE X 2,   STAT     11/19/18 0353   11/19/18 0354  Lactic acid, plasma  Now then every 2 hours,   STAT     11/19/18 0353          Vitals/Pain Today's Vitals   11/19/18 0535 11/19/18 0600 11/19/18 0630 11/19/18 0645  BP:  (!) 154/92 (!) 175/93 (!) 130/107  Pulse:  64 (!) 118 (!) 122  Resp:   (!) 23 (!) 21  Temp:      TempSrc:       SpO2:  94% 95% 93%  Weight:      Height:      PainSc: 0-No pain       Isolation Precautions Airborne and Contact precautions  Medications Medications  midazolam (VERSED) injection 1 mg (1 mg Intravenous Not Given 11/19/18 0533)  heparin injection 5,000 Units (has no administration in time range)  pantoprazole (PROTONIX) injection 40 mg (has no administration in time range)  thiamine (B-1) injection 100 mg (has no administration in time range)  folic acid injection 1 mg (has no administration in time range)  azithromycin (ZITHROMAX) 500 mg in sodium chloride 0.9 % 250 mL IVPB (has no administration in time range)  dexmedetomidine (PRECEDEX) 200 MCG/50ML (4 mcg/mL) infusion (has no administration in time range)  cefTRIAXone (ROCEPHIN) 2 g in sodium chloride 0.9 % 100 mL IVPB (has no administration in time range)  hydroxychloroquine (PLAQUENIL) tablet 400 mg (has no administration in time range)  hydroxychloroquine (PLAQUENIL) tablet 200 mg (has no administration in time range)  insulin aspart (novoLOG) injection 0-15 Units (has no administration in time range)  iohexol (OMNIPAQUE) 350 MG/ML injection 100 mL (100 mLs Intravenous Contrast Given 11/19/18 0335)  acetaminophen (TYLENOL) suppository 650 mg (650 mg Rectal Given 11/19/18 0422)  ceFEPIme (MAXIPIME) 2 g in sodium chloride 0.9 % 100 mL IVPB (0 g Intravenous Stopped 11/19/18 0532)  metroNIDAZOLE (FLAGYL) IVPB 500 mg (0 mg Intravenous Stopped 11/19/18 0554)  furosemide (LASIX) injection 40 mg (40 mg Intravenous Given 11/19/18 0438)  sodium chloride 0.9 % bolus 1,000 mL (1,000 mLs Intravenous New Bag/Given 11/19/18 0532)  sodium chloride 0.9 % bolus 1,000 mL (1,000 mLs Intravenous New Bag/Given 11/19/18 0532)    Mobility non-ambulatory Moderate fall risk  Focused Assessments Neuro Assessment Handoff:  Swallow screen pass? No    NIH Stroke Scale ( + Modified Stroke Scale Criteria)  Interval: Initial Level of Consciousness (1a.)   :  Alert, keenly responsive LOC Questions (1b. )   +: Answers neither question correctly LOC Commands (1c. )   + : Performs one task correctly Best Gaze (2. )  +: Partial gaze palsy Visual (3. )  +: Complete hemianopia Facial Palsy (4. )    : Minor paralysis Motor Arm, Left (5a. )   +: No drift Motor Arm, Right (5b. )   +: Drift Motor Leg, Left (6a. )   +: No drift Motor Leg, Right (6b. )   +: No drift Limb Ataxia (7. ): Present in two limbs Sensory (8. )   +: Mild-to-moderate sensory loss, patient feels pinprick is less sharp or is dull on the affected side, or there is a loss of superficial pain with pinprick, but patient is aware of being touched Best Language (9. )   +: Severe aphasia Dysarthria (10. ): Severe dysarthria, patient's speech is so slurred as to be unintelligible in the absence of or out of proportion to any dysphasia, or is mute/anarthric Extinction/Inattention (11.)   +: Profound hemi-inattention or extinction to more than one modality Modified SS Total  +: 12 Complete NIHSS TOTAL: 17 Last date known well: 11/18/18 Last time known well: 1800 Neuro Assessment:   Neuro Checks:   Initial (11/19/18 0400)  Last Documented NIHSS Modified Score: 12 (11/19/18 0400) Has TPA been given? No If patient is a Neuro Trauma and patient is going to OR before floor call report to [place] nurse: [phone] or [phone]     R Recommendations: See Admitting Provider Note  Report given to:   Additional Notes:  covid rule out, disoriented, cooling blanket

## 2018-11-19 NOTE — Progress Notes (Signed)
Patient intubated by MD using glidescope S4.  Patient intubated with 8.0 ETT secured at 24 at the lip.  Patient preoxygenated with 100% via ambu bag and peep valve prior to intubation.  Patient tolerated procedure well. Placed on vent with PRVC RR 16, VT 500 ml. FiO2 100%, Peep 18.  Will continue to monitor.

## 2018-11-19 NOTE — H&P (Signed)
Name: Larry [name] MRN: [id number] DOB: February 03, 1941 Cc: AMS   LOS: 0  Home Critical Care Note   History of Present Illness:  78 yo male with hx of HTN, HLD , DM, ALCOHOL abuse presenting with altered mental status and right facial droop. Last seen ok at 6 pm last night. He was a code stroke, CT's performed , no big stroke found and seen by Neurology. He then was seen febrile, still altered and LP was performed. Then noted to be hypoxic and requiring 4 L . CXR with diffuse infiltrates. CCM consuLTED During our evaluation the patient is AAO3 but confused and acting strangely , he is protecting his airway and speaking in full sentences. Denies any cp , n/v/d.  Unable to complete full ROS given pts mental statust.  Lines / Drains:PIV   Cultures: pending   Antibiotics: azithromycin   Tests / Events: LP done in ED and code stroke    Past Medical History:  Diagnosis Date  . Carotid stenosis, left   . Cataracts, bilateral    removed  . Coronary artery disease   . Hyperlipemia   . Prostate CA ([place])   . Sleep apnea    Dr Ledora Bottcher ; uses CPAP  . Type II or unspecified type diabetes mellitus without mention of complication, not stated as uncontrolled   . Unspecified essential hypertension     Past Surgical History:  Procedure Laterality Date  . APPENDECTOMY    . CARDIAC CATHETERIZATION    . CATARACT EXTRACTION, BILATERAL    . CORONARY ARTERY BYPASS GRAFT N/A 06/27/2017   Procedure: CORONARY ARTERY BYPASS GRAFTING (CABG), time four   using the left internal mammary artery, and right saphenous leg vein harvested endoscopically.;  Surgeon: Ivin Poot, MD;  Location: [place];  Service: Open Heart Surgery;  Laterality: N/A;  . CYSTOSCOPY N/A 06/27/2017   Procedure: CYSTOSCOPY FLEXIBLE, Balloon dilatation placement of foley catheter;  Surgeon: Ivin Poot, MD;  Location: [place];  Service: Open Heart Surgery;  Laterality: N/A;  . ENDARTERECTOMY Left 08/22/2017   Procedure:  ENDARTERECTOMY CAROTID LEFT;  Surgeon: Serafina Mitchell, MD;  Location: [place];  Service: Vascular;  Laterality: Left;  . INTRAVASCULAR ULTRASOUND/IVUS N/A 06/24/2017   Procedure: Intravascular Ultrasound/IVUS;  Surgeon: Belva Crome, MD;  Location: [place] CV LAB;  Service: Cardiovascular;  Laterality: N/A;  LEFT MAIN  . LEFT HEART CATH AND CORONARY ANGIOGRAPHY N/A 06/24/2017   Procedure: LEFT HEART CATH AND CORONARY ANGIOGRAPHY;  Surgeon: Belva Crome, MD;  Location: [place] CV [facility];  Service: Cardiovascular;  Laterality: N/A;  . PATCH ANGIOPLASTY Left 08/22/2017   Procedure: PATCH ANGIOPLASTY USING Rueben Bash BIOLOGIC PATCH;  Surgeon: Serafina Mitchell, MD;  Location: [place];  Service: Vascular;  Laterality: Left;  . PROSTATECTOMY    . TEE WITHOUT CARDIOVERSION N/A 06/27/2017   Procedure: TRANSESOPHAGEAL ECHOCARDIOGRAM (TEE);  Surgeon: Prescott Gum, Collier Salina, MD;  Location: [place];  Service: Open Heart Surgery;  Laterality: N/A;  . TONSILLECTOMY    . ULTRASOUND GUIDANCE FOR VASCULAR ACCESS  06/24/2017   Procedure: Ultrasound Guidance For Vascular Access;  Surgeon: Belva Crome, MD;  Location: [place] CV [facility];  Service: Cardiovascular;;    Prior to Admission medications   Medication Sig Start Date End Date Taking? Authorizing Provider  ACCU-CHEK FASTCLIX LANCETS MISC 1 each by Does not apply route 2 (two) times daily. Dx:E11.9 11/26/17  Yes Plotnikov, [name], MD  aspirin EC 81 MG tablet Take 1 tablet (  81 mg total) by mouth daily. 09/11/18  Yes Belva Crome, MD  Cholecalciferol (VITAMIN D3) 1000 UNITS CAPS Take 1,000 Units by mouth daily.    Yes [provider]  furosemide (LASIX) 20 MG tablet Take 1 tablet (20 mg total) by mouth daily. Patient taking differently: Take 10 mg by mouth daily.  09/11/18 12/10/18 Yes Belva Crome, MD  glimepiride (AMARYL) 2 MG tablet TAKE 1 TABLET BY MOUTH TWICE DAILY Patient taking differently: Take 2 mg by mouth 2 (two) times daily.  09/14/18  Yes  Plotnikov, [name], MD  glucose monitoring kit (FREESTYLE) monitoring kit 1 each by Does not apply route as needed for other. Please dispense brand of choice per ins/patient. Dx: 250.00. 05/08/13  Yes Plotnikov, [name], MD  losartan (COZAAR) 100 MG tablet Take 1 tablet by mouth once daily 10/20/18  Yes Plotnikov, [name], MD  metFORMIN (GLUCOPHAGE) 1000 MG tablet Take 1,000 mg by mouth 2 (two) times daily with a meal.   Yes [provider]  metoprolol succinate (TOPROL-XL) 25 MG 24 hr tablet Take 3 tablets (75 mg total) by mouth daily. Take with or immediately following a meal. 10/31/18  Yes Belva Crome, MD  Multiple Vitamins-Minerals (MACULAR VITAMIN BENEFIT) TABS Take 1 tablet by mouth 2 (two) times daily.   Yes [provider]  nitroGLYCERIN (NITROSTAT) 0.4 MG SL tablet Place 1 tablet (0.4 mg total) under the tongue every 5 (five) minutes as needed for chest pain. 06/18/17  Yes Plotnikov, [name], MD  simvastatin (ZOCOR) 40 MG tablet Take 40 mg by mouth every evening.    Yes [provider]    Allergies Allergies  Allergen Reactions  . Amlodipine Swelling    Leg swelling  . Verapamil Swelling    Edema w/high dose  . Clindamycin Hcl     UNSPECIFIED REACTION   . Contrast Media [Iodinated Diagnostic Agents] Nausea And Vomiting    Family History Family History  Problem Relation Age of Onset  . Heart disease Mother   . Diabetes Other   . Hypertension Other   . Colon cancer Neg Hx     Social History  reports that he has never smoked. He has never used smokeless tobacco. He reports that he does not drink alcohol or use drugs.  Review Of Systems:   Vital Signs: Temp:  [102.4 F (39.1 C)-104.1 F (40.1 C)] 104.1 F (40.1 C) (04/01 0534) Pulse Rate:  [64-127] 122 (04/01 0645) Resp:  [20-31] 21 (04/01 0645) BP: (130-192)/(92-117) 130/107 (04/01 0645) SpO2:  [91 %-95 %] 93 % (04/01 0645) Weight:  [112.2 kg] 112.2 kg (04/01 0534) No intake/output  data recorded.  Physical Examination: General: confused but able to be AAO3 intermittently Neuro: moving all extremities , does not seem to have R facial droop anymore, he follows commands but is agitated at times CV: tachycardic , s1 s2 n , no m/r/g PULM: diffuse ronchi bilaterally, no wheezing or crackles GI: obese, BS , nt nd Extremities: + 1 pitting edema in the LE b/l   Ventilator settings:     Labs    CBC Recent Labs  Lab 11/19/18 0320 11/19/18 0549  HGB 15.8 16.0  HCT 50.0 47.0  WBC 15.9*  --   PLT 324  --      BMET Recent Labs  Lab 11/19/18 0320 11/19/18 0402 11/19/18 0549  NA 137  --  137  K 4.6  --  4.2  CL 100  --   --  CO2 22  --   --   GLUCOSE 325*  --   --   BUN 21  --   --   CREATININE 1.64* 1.40*  --   CALCIUM 9.6  --   --     Recent Labs  Lab 11/19/18 0320  INR 1.0    Recent Labs  Lab 11/19/18 0549  PHART 7.327*  PCO2ART 36.1  PO2ART 73.0*  HCO3 18.4*  TCO2 19*  O2SAT 90.0     Radiology: CXR - diffuse infiltrates  Assessment and Plan: Principal Problem:   Acute respiratory failure with hypoxemia (HCC) Active Problems:   Acute encephalopathy   Close Exposure to Covid-19 Virus  Neuro - toxic metabolic encephalopathy , suspect this is secondary to sepsis Delirium - says he drinks but cannot quantify, will start precedex drip , thiamine and folic acid Need to clarify drinking hx but no family available Cannot rule out  Small CVA on workup today , did not explain his R facial droop symptoms Being seen by Neurology   CVS- HD stable , tachycardic because of fever TTE last year EF 45%  Pulmonary/ID - acute resp failure with hypoxemia , diffuse infiltrates on CXR, ON 4 L [place] looks comfortable for now High suspicion for COVID-19 sepsis , recent exposure , his boss tested positive RVP and COVID-19 test sent Will cover for CAP , azithro and rocephin , consult ID for opinion re: plaquenil  Renal - AKI on CKD , making good urine  , will hold off IVF. Lactic acid elevated , will repeat  GI - keep NPO   Endo- DM2 , NISS  Best practices / Disposition: -->Code Status: full -->DVT Px: HSQ -->GI Px: NOT INDICATED -->Diet:NPO except meds  [place] PRECAUTIONS  Critical Care time spent : 45 minutes  [place]  11/19/2018, 7:07 AM

## 2018-11-19 NOTE — ED Triage Notes (Signed)
Patient last seen normal around 6pm last night.  Patient with facial droop on the right, aphasic and confusion.  Patient is able to state his name. Patient with weakness on the right arm.  Patient taken to CT.

## 2018-11-19 NOTE — Progress Notes (Signed)
S: Agitated per RN earlier,   O: Vitals:   11/19/18 1230 11/19/18 1245  BP: 110/69 113/71  Pulse: 66 66  Resp: 14 14  Temp:    SpO2: 100% 100%   General: Acutely ill appearing male, respiratory distress HEENT: [place]/AT, PERRL, EOM-I and MMM Heart: Regular, tachy, Nl S1/S2 and -M/R/G Lung: Coarse BS diffusely Abdomen: Soft, NT, ND and +BS Ext: -edema and -tenderness Neuro: obtunded but moving all ext to command Skin: intact  I reviewed CXR myself, ETT is high and infiltrate noted  All labs reviewed  A/P 78 year old male with reported close contact with COVID 19 patient (unsure what that is at this point but test has been sent) who presents to PCCM with AMS, etoh withdrawal and acute hypoxemic respiratory failure.  Discussed with PCCM-NP  Acute respiratory failure: hypoxic and not protecting his airway  - Intubate  - Full vent support with high PEEP  - ABG  - Adjust vent   - Abx as ordered  - Maintain dry  ID: ?COVID vs CAP  - Rocephin  - Zithromax  - Plaquinel started, will continue til covid test results  - F/U on COVID swab  AMS:  - Minimize sedation as able  - F/U on LP  - Neurology following  ETOH  - Thimaine  - Folate  - MVI  - Precedex  The patient is critically ill with multiple organ systems failure and requires high complexity decision making for assessment and support, frequent evaluation and titration of therapies, application of advanced monitoring technologies and extensive interpretation of multiple databases.   Critical Care Time devoted to patient care services described in this note is  75  Minutes. This time reflects time of care of this signee Dr Jennet Maduro. This critical care time does not reflect procedure time, or teaching time or supervisory time of PA/NP/Med student/Med Resident etc but could involve care discussion time.  Rush Farmer, M.D. [facility] Pulmonary/Critical Care Medicine. Pager: [phone]. After hours pager: [phone].

## 2018-11-19 NOTE — ED Notes (Signed)
EDP ([facility]) called pt's wife at home due to visitor restrictions. EDP explained Lumbar Puncture procedure and the reason fo the procedure. This RN witnessed wife give verbal consent over the telephone to EDP and staff for procedure.

## 2018-11-19 NOTE — Progress Notes (Signed)
[facility] Progress Note Patient Name: Larry [name] DOB: 10-10-1940 MRN: [id number]   Date of Service  11/19/2018  HPI/Events of Note  Multiple issues: 1. Patient intubated and sedated - Do we still need Q 1 hour neuro checks and 2. Unable to auscultate OGT - Request for abdominal film to confirm placement.   eICU Interventions  Will order: 1. Change Neuro checks to Q 4 hours. 2. Abdominal film STAT to comfirm OGT placement.      Intervention Category Major Interventions: Other:  [name] 11/19/2018, 8:50 PM

## 2018-11-19 NOTE — Procedures (Signed)
Intubation Procedure Note RIGBY LEONHARDT [id number] 1941/07/17  Procedure: Intubation Indications: Airway protection and maintenance  Procedure Details Consent: Unable to obtain consent because of emergent medical necessity. Time Out: Verified patient identification, verified procedure, site/side was marked, verified correct patient position, special equipment/implants available, medications/allergies/relevent history reviewed, required imaging and test results available.  Performed  Maximum sterile technique was used including cap, gloves, gown, hand hygiene, mask and sheet.  MAC    Evaluation Hemodynamic Status: BP stable throughout; O2 sats: stable throughout Patient's Current Condition: stable Complications: No apparent complications Patient did tolerate procedure well. Chest X-ray ordered to verify placement.  CXR: pending.  During procedure, patient coughed vigorously prior to paralytic being given but then was sedated, intubated and intubation box was placed over the patient to prevent further exposure  Venissa Nappi 11/19/2018

## 2018-11-20 ENCOUNTER — Inpatient Hospital Stay (HOSPITAL_COMMUNITY): Payer: Medicare Other

## 2018-11-20 DIAGNOSIS — G9340 Encephalopathy, unspecified: Secondary | ICD-10-CM

## 2018-11-20 DIAGNOSIS — A419 Sepsis, unspecified organism: Secondary | ICD-10-CM

## 2018-11-20 DIAGNOSIS — I6340 Cerebral infarction due to embolism of unspecified cerebral artery: Secondary | ICD-10-CM

## 2018-11-20 LAB — CBC
HCT: 42.6 % (ref 39.0–52.0)
Hemoglobin: 14.1 g/dL (ref 13.0–17.0)
MCH: 30.3 pg (ref 26.0–34.0)
MCHC: 33.1 g/dL (ref 30.0–36.0)
MCV: 91.6 fL (ref 80.0–100.0)
Platelets: 208 10*3/uL (ref 150–400)
RBC: 4.65 MIL/uL (ref 4.22–5.81)
RDW: 12.7 % (ref 11.5–15.5)
WBC: 16.8 10*3/uL — ABNORMAL HIGH (ref 4.0–10.5)
nRBC: 0 % (ref 0.0–0.2)

## 2018-11-20 LAB — GLUCOSE, CAPILLARY
Glucose-Capillary: 175 mg/dL — ABNORMAL HIGH (ref 70–99)
Glucose-Capillary: 186 mg/dL — ABNORMAL HIGH (ref 70–99)
Glucose-Capillary: 159 mg/dL — ABNORMAL HIGH (ref 70–99)
Glucose-Capillary: 145 mg/dL — ABNORMAL HIGH (ref 70–99)
Glucose-Capillary: 166 mg/dL — ABNORMAL HIGH (ref 70–99)
Glucose-Capillary: 148 mg/dL — ABNORMAL HIGH (ref 70–99)

## 2018-11-20 LAB — BASIC METABOLIC PANEL
Anion gap: 8 (ref 5–15)
BUN: 30 mg/dL — ABNORMAL HIGH (ref 8–23)
CO2: 22 mmol/L (ref 22–32)
Calcium: 8.5 mg/dL — ABNORMAL LOW (ref 8.9–10.3)
Chloride: 110 mmol/L (ref 98–111)
Creatinine, Ser: 1.61 mg/dL — ABNORMAL HIGH (ref 0.61–1.24)
GFR calc Af Amer: 47 mL/min — ABNORMAL LOW (ref >60)
GFR calc non Af Amer: 41 mL/min — ABNORMAL LOW (ref >60)
Glucose, Bld: 196 mg/dL — ABNORMAL HIGH (ref 70–99)
Potassium: 4.2 mmol/L (ref 3.5–5.1)
Sodium: 140 mmol/L (ref 135–145)

## 2018-11-20 LAB — LIPID PANEL
Cholesterol: 129 mg/dL (ref 0–200)
HDL: 38 mg/dL — ABNORMAL LOW (ref >40)
LDL Cholesterol: 68 mg/dL (ref 0–99)
Total CHOL/HDL Ratio: 3.4 RATIO
Triglycerides: 113 mg/dL (ref <150)
VLDL: 23 mg/dL (ref 0–40)

## 2018-11-20 LAB — URINE CULTURE: Culture: NO GROWTH

## 2018-11-20 LAB — HEMOGLOBIN A1C
Hgb A1c MFr Bld: 6.8 % — ABNORMAL HIGH (ref 4.8–5.6)
Mean Plasma Glucose: 148.46 mg/dL

## 2018-11-20 LAB — HSV DNA BY PCR (REFERENCE LAB)
HSV 1 DNA: NEGATIVE
HSV 2 DNA: NEGATIVE

## 2018-11-20 LAB — MAGNESIUM: Magnesium: 1.7 mg/dL (ref 1.7–2.4)

## 2018-11-20 LAB — LEGIONELLA PNEUMOPHILA SEROGP 1 UR AG: L. pneumophila Serogp 1 Ur Ag: NEGATIVE

## 2018-11-20 LAB — PHOSPHORUS: Phosphorus: 1.6 mg/dL — ABNORMAL LOW (ref 2.5–4.6)

## 2018-11-20 MED ORDER — PRO-STAT SUGAR FREE PO LIQD
60.0000 mL | Freq: Three times a day (TID) | ORAL | Status: DC
  Administered 2018-11-21 – 2018-11-22 (×4): 60 mL
  Filled 2018-11-20 (×6): qty 60

## 2018-11-20 MED ORDER — SODIUM PHOSPHATES 45 MMOLE/15ML IV SOLN
30.0000 mmol | Freq: Once | INTRAVENOUS | Status: AC
  Administered 2018-11-20: 30 mmol via INTRAVENOUS
  Filled 2018-11-20: qty 10

## 2018-11-20 MED ORDER — SODIUM CHLORIDE 0.9 % IV BOLUS
500.0000 mL | Freq: Once | INTRAVENOUS | Status: AC
  Administered 2018-11-20: 500 mL via INTRAVENOUS

## 2018-11-20 MED ORDER — HYDROXYCHLOROQUINE SULFATE 200 MG PO TABS
400.0000 mg | ORAL_TABLET | Freq: Two times a day (BID) | ORAL | Status: AC
  Administered 2018-11-20: 400 mg via ORAL
  Filled 2018-11-20: qty 2

## 2018-11-20 MED ORDER — ADULT MULTIVITAMIN W/MINERALS CH
1.0000 | ORAL_TABLET | Freq: Every day | ORAL | Status: DC
  Administered 2018-11-21 – 2018-11-23 (×3): 1
  Filled 2018-11-20 (×5): qty 1

## 2018-11-20 MED ORDER — HYDROXYCHLOROQUINE SULFATE 200 MG PO TABS
200.0000 mg | ORAL_TABLET | Freq: Two times a day (BID) | ORAL | Status: DC
  Administered 2018-11-21 – 2018-11-24 (×6): 200 mg via ORAL
  Filled 2018-11-20 (×7): qty 1

## 2018-11-20 MED ORDER — VITAL HIGH PROTEIN PO LIQD
1000.0000 mL | ORAL | Status: DC
  Filled 2018-11-20 (×2): qty 1000

## 2018-11-20 MED ORDER — ENOXAPARIN SODIUM 60 MG/0.6ML ~~LOC~~ SOLN
50.0000 mg | SUBCUTANEOUS | Status: DC
  Administered 2018-11-20 – 2018-11-23 (×4): 50 mg via SUBCUTANEOUS
  Filled 2018-11-20 (×3): qty 0.5
  Filled 2018-11-20 (×2): qty 0.6

## 2018-11-20 MED ORDER — MAGNESIUM SULFATE 2 GM/50ML IV SOLN
2.0000 g | Freq: Once | INTRAVENOUS | Status: AC
  Administered 2018-11-20: 2 g via INTRAVENOUS
  Filled 2018-11-20: qty 50

## 2018-11-20 NOTE — Progress Notes (Signed)
Pt's urinary output is low. MD made aware. 500 mL bolus of NS ordered. Will continue to assess.

## 2018-11-20 NOTE — Progress Notes (Signed)
Name: Larry [name] MRN: [id number] DOB: Oct 23, 1940 Cc: AMS   LOS: 1  [facility] Note   History of Present Illness:  78 yo male with hx of HTN, HLD , DM, ALCOHOL abuse presenting with altered mental status and right facial droop. Last seen ok at 6 pm last night. He was a code stroke, CT's performed , no big stroke found and seen by Neurology. He then was seen febrile, still altered and LP was performed. Then noted to be hypoxic and requiring 4 L [place]. CXR with diffuse infiltrates. CCM consuLTED During our evaluation the patient is AAO3 but confused and acting strangely , he is protecting his airway and speaking in full sentences. Denies any cp , n/v/d.  Unable to complete full ROS given pts mental statust.  Lines / Drains:PIV ETT 4/1>>>  Cultures:  Blood 4/1>>> Sputum 4/1>>> Urine 4/1>>> RVP 4/1>>> Covid 4/1>>>  Antibiotics:  Azithromycin 4/1>>>  Tests / Events: LP done in ED and code stroke ETT 4/1  Vital Signs: Temp:  [97.6 F (36.4 C)-101.2 F (38.4 C)] 98.2 F (36.8 C) (04/02 0800) Pulse Rate:  [53-87] 82 (04/02 1000) Resp:  [13-17] 16 (04/02 1000) BP: (89-145)/(49-78) 129/66 (04/02 1000) SpO2:  [100 %] 100 % (04/02 1000) FiO2 (%):  [50 %-100 %] 50 % (04/02 0800) Weight:  [112.3 kg] 112.3 kg (04/02 0000) I/O last 3 completed shifts: In: 1872.6 [I.V.:529.7; IV Piggyback:1342.9] Out: 1270 [Urine:1270]  Physical Examination: General: Acutely ill appearing male, NAD Neuro: Arousable but not following commands HEENT: /AT, PERRL, EOM-I and MMM CV: RRR, Nl S1/s2 and -M/R/G PULM: Diffuse crackles GI: Soft, NT, ND and +BS Extremities: + 1 pitting edema in the LE b/l Skin: Intact  Ventilator settings:  Vent Mode: PRVC FiO2 (%):  [50 %-100 %] 50 % Set Rate:  [16 bmp] 16 bmp Vt Set:  [500 mL] 500 mL PEEP:  [10 IHK74-25 cmH20] 10 cmH20 Plateau Pressure:  [17 cmH20-21 cmH20] 21 cmH20  Labs    CBC Recent Labs  Lab 11/19/18 0320 11/19/18 0549  11/19/18 1139 11/20/18 0425  HGB 15.8 16.0 13.6 14.1  HCT 50.0 47.0 40.0 42.6  WBC 15.9*  --   --  16.8*  PLT 324  --   --  208   BMET Recent Labs  Lab 11/19/18 0320 11/19/18 0402 11/19/18 0549 11/19/18 1139 11/20/18 0425  NA 137  --  137 141 140  K 4.6  --  4.2 4.1 4.2  CL 100  --   --   --  110  CO2 22  --   --   --  22  GLUCOSE 325*  --   --   --  196*  BUN 21  --   --   --  30*  CREATININE 1.64* 1.40*  --   --  1.61*  CALCIUM 9.6  --   --   --  8.5*  MG  --   --   --   --  1.7  PHOS  --   --   --   --  1.6*   Recent Labs  Lab 11/19/18 0320  INR 1.0   Recent Labs  Lab 11/19/18 0549 11/19/18 1139  PHART 7.327* 7.350  PCO2ART 36.1 43.5  PO2ART 73.0* 501.0*  HCO3 18.4* 23.4  TCO2 19* 25  O2SAT 90.0 100.0   Radiology: CXR - diffuse infiltrates  Assessment and Plan: Principal Problem:   Acute respiratory failure with hypoxemia (HCC) Active Problems:  Acute encephalopathy   Close Exposure to Covid-19 Virus   Sepsis ([place])   Encephalopathy  Neuro - toxic metabolic encephalopathy , suspect this is secondary to sepsis Delirium - Sedation via precedex and fentanyl Small CVA on workup today , did not explain his R facial droop symptoms Neurology input appreciated  CVS- HD stable , tachycardic because of fever TTE last year EF 45%  Pulmonary/ID -  Full vent support Adjust vent for ABG Patient's coworkers as positive for COVID RVP and COVID-19 test sent CAP coverage Hold plaquinel for now  Renal -  Keep dry as able  GI -  TF per nutrition   Endo- DM2 , NISS  Best practices / Disposition: -->Code Status: full -->DVT Px: HSQ -->GI Px: NOT INDICATED -->Diet:NPO except meds  [place] PRECAUTIONS  The patient is critically ill with multiple organ systems failure and requires high complexity decision making for assessment and support, frequent evaluation and titration of therapies, application of advanced monitoring technologies and extensive  interpretation of multiple databases.   Critical Care Time devoted to patient care services described in this note is  32  Minutes. This time reflects time of care of this signee Dr Jennet Maduro. This critical care time does not reflect procedure time, or teaching time or supervisory time of PA/NP/Med student/Med Resident etc but could involve care discussion time.  Rush Farmer, M.D. [facility] Pulmonary/Critical Care Medicine. Pager: [phone]. After hours pager: [phone].

## 2018-11-20 NOTE — Progress Notes (Addendum)
Initial Nutrition Assessment  RD working remotely.  DOCUMENTATION CODES:   Obesity unspecified  INTERVENTION:    Vital High Protein at 35 ml/h (840 ml per day)  Pro-stat 60 ml TID  Provides 1440 kcal, 164 gm protein, 702 ml free water daily  NUTRITION DIAGNOSIS:   Inadequate oral intake related to inability to eat as evidenced by NPO status.  GOAL:   Provide needs based on ASPEN/SCCM guidelines  MONITOR:   Vent status, TF tolerance, Labs  REASON FOR ASSESSMENT:   Ventilator    ASSESSMENT:   78 yo male with PMH of HLD, DM-2, HTN, prostate CA, CAD, and reported close contact with a COVID-19 patient who was admitted with AMS, ETOH withdrawal, and acute hypoxemia respiratory failure requiring intubation.   Patient is currently intubated on ventilator support. MV: 7.6 L/min Temp (24hrs), Avg:98.1 F (36.7 C), Min:97.6 F (36.4 C), Max:98.5 F (36.9 C)   OG/NG tube unable to be placed in correct position at bedside yesterday. OGT placed today by RN. Reports says tip is at the GE junction, not in the stomach. Per discussion with CCM physician, he will advance tube today and okay to begin TF today per RD.   Labs reviewed. Phosphorus 1.6 (L) CBG's: 186-159 COVID-19 results pending  Medications reviewed and include folic acid, Plaquenil, novolog, thiamine, precedex, fentanyl.   Moderate edema to R & L LE per RN documentation this morning.   NUTRITION - FOCUSED PHYSICAL EXAM:  unable to complete  Diet Order:   Diet Order            Diet NPO time specified  Diet effective now              EDUCATION NEEDS:   No education needs have been identified at this time  Skin:  Skin Assessment: Reviewed RN Assessment  Last BM:  none documented since admission  Height:   Ht Readings from Last 1 Encounters:  11/19/18 6' (1.829 m)    Weight:   Wt Readings from Last 1 Encounters:  11/20/18 112.3 kg    Ideal Body Weight:  80.9 kg  BMI:  Body mass index  is 33.58 kg/m.  Estimated Nutritional Needs:   Kcal:  8110-3159  Protein:  162 gm  Fluid:  2 L    Molli Barrows, RD, LDN, [place] Pager [phone] After Hours Pager [phone]

## 2018-11-20 NOTE — Progress Notes (Signed)
Virtual Visit via Video Note  I connected with [name]    by a video enabled telemedicine application and verified that I am speaking with the correct person using two identifiers the help of his nurse who was at the bedside throughout this video telemetry consultation.  I  was unable discussed the limitations of evaluation and management by telemedicine and the availability of in person appointments. With the  patient  As he ws sedated and intubated.   STROKE TEAM PROGRESS NOTE  INTERVAL HISTORY He remains on ventilatory support but off sedation he is able to follow commands and move all 4 extremities against gravity. His test results for Deniece Portela is not back yet and critical care team to not plan to extubate him until this happens.his blood pressure is adequately controlled. He is afebrile.his MRI scan has not yet been done due to concerns about his corona virus status  Vitals:   11/20/18 0400 11/20/18 0500 11/20/18 0600 11/20/18 0758  BP: 128/60 (!) 121/58 120/64 (!) 145/71  Pulse: (!) 53 (!) 53 (!) 55 81  Resp: 16 16 16 16   Temp: 97.6 F (36.4 C)     TempSrc: Rectal     SpO2: 100% 100% 100% 100%  Weight:      Height:        CBC:  Recent Labs  Lab 11/19/18 0320  11/19/18 1139 11/20/18 0425  WBC 15.9*  --   --  16.8*  NEUTROABS 13.8*  --   --   --   HGB 15.8   < > 13.6 14.1  HCT 50.0   < > 40.0 42.6  MCV 92.1  --   --  91.6  PLT 324  --   --  208   < > = values in this interval not displayed.    Basic Metabolic Panel:  Recent Labs  Lab 11/19/18 0320 11/19/18 0402  11/19/18 1139 11/20/18 0425  NA 137  --    < > 141 140  K 4.6  --    < > 4.1 4.2  CL 100  --   --   --  110  CO2 22  --   --   --  22  GLUCOSE 325*  --   --   --  196*  BUN 21  --   --   --  30*  CREATININE 1.64* 1.40*  --   --  1.61*  CALCIUM 9.6  --   --   --  8.5*  MG  --   --   --   --  1.7  PHOS  --   --   --   --  1.6*   < > = values in this interval not displayed.   Lipid  Panel:     Component Value Date/Time   CHOL 129 11/20/2018 0425   CHOL 137 09/19/2018 0743   TRIG 113 11/20/2018 0425   HDL 38 (L) 11/20/2018 0425   HDL 39 (L) 09/19/2018 0743   CHOLHDL 3.4 11/20/2018 0425   VLDL 23 11/20/2018 0425   LDLCALC 68 11/20/2018 0425   LDLCALC 67 09/19/2018 0743   HgbA1c:  Lab Results  Component Value Date   HGBA1C 6.8 (H) 11/20/2018   Urine Drug Screen:     Component Value Date/Time   LABOPIA NONE DETECTED 11/19/2018 0459   COCAINSCRNUR NONE DETECTED 11/19/2018 0459   LABBENZ NONE DETECTED 11/19/2018 0459   AMPHETMU NONE DETECTED 11/19/2018 0459   THCU  NONE DETECTED 11/19/2018 0459   LABBARB NONE DETECTED 11/19/2018 0459    Alcohol Level     Component Value Date/Time   ETH <10 11/19/2018 0320    IMAGING Ct Angio Head W Or Wo Contrast  Result Date: 11/19/2018 CLINICAL DATA:  Aphasia and confusion. Follow up code stroke. History of LEFT carotid endarterectomy, hypertension and diabetes. EXAM: CT ANGIOGRAPHY HEAD AND NECK CT PERFUSION BRAIN TECHNIQUE: Multidetector CT imaging of the head and neck was performed using the standard protocol during bolus administration of intravenous contrast. Multiplanar CT image reconstructions and MIPs were obtained to evaluate the vascular anatomy. Carotid stenosis measurements (when applicable) are obtained utilizing NASCET criteria, using the distal internal carotid diameter as the denominator. Multiphase CT imaging of the brain was performed following IV bolus contrast injection. Subsequent parametric perfusion maps were calculated using RAPID software. CONTRAST:  187mL OMNIPAQUE IOHEXOL 350 MG/ML SOLN COMPARISON:  CT HEAD November 19, 2018 and CT angiogram neck August 01, 2017 FINDINGS: CTA NECK FINDINGS: AORTIC ARCH: Normal appearance of the thoracic arch, 2 vessel arch is a normal variant. Mild calcific atherosclerosis and intimal thickening. The origins of the innominate, left Common carotid artery and subclavian  artery are patent. RIGHT CAROTID SYSTEM: Common carotid artery is patent. Moderate calcific atherosclerosis of the carotid bifurcation without hemodynamically significant stenosis by NASCET criteria. Normal appearance of the internal carotid artery. LEFT CAROTID SYSTEM: Common carotid artery is patent, moderate intimal thickening and mild stenosis. Status post LEFT carotid endarterectomy. Within 1 cm of the origin is and 11 mm segment of 60% stenosis by NASCET criteria. VERTEBRAL ARTERIES:Codominant vertebral arteries. Moderate to severe bilateral V1 segment stenosis. SKELETON: No acute osseous process though bone windows have not been submitted. OTHER NECK: Soft tissues of the neck are nonacute though, not tailored for evaluation. UPPER CHEST: Small bilateral pleural effusions. Interlobular septal thickening, bronchial wall thickening and mosaic attenuation seen with pulmonary edema. Status post median sternotomy. CTA HEAD FINDINGS: ANTERIOR CIRCULATION: Patent cervical internal carotid arteries, petrous, cavernous and supra clinoid internal carotid arteries. Calcific atherosclerosis resulting in moderate stenosis bilateral carotid siphon. Patent anterior communicating artery. Patent anterior and middle cerebral arteries. Moderate stenosis RIGHT A2 segment, RIGHT M2 No large vessel occlusion, flow-limiting stenosis, contrast extravasation or aneurysm. POSTERIOR CIRCULATION: Patent vertebral arteries, vertebrobasilar junction and basilar artery, as well as main branch vessels. Calcific atherosclerosis resulting in moderate stenosis moderate stenosis distal RIGHT V4 segment. Patent posterior cerebral arteries, moderate luminal irregularity compatible with atherosclerosis. No large vessel occlusion, flow-limiting stenosis, contrast extravasation or aneurysm. VENOUS SINUSES: Major dural venous sinuses are patent though not tailored for evaluation on this angiographic examination. ANATOMIC VARIANTS: Hypoplastic LEFT A1  segment. DELAYED PHASE: Not performed. MIP images reviewed. CT Brain Perfusion Findings: CBF (<30%) Volume: 58mL Perfusion (Tmax>6.0s) volume: 63mL Mismatch Volume: 44mL Infarction Location:LEFT occipital lobe. IMPRESSION: CTA NECK: 1. Status post LEFT carotid endarterectomy with 60% stenosis LEFT ICA by NASCET criteria. 2. Patent vertebral arteries. Moderate to severe stenosis bilateral V1 segments. 3. Small pleural effusions and findings of pulmonary edema. Recommend chest radiograph. CTA HEAD: 1. No emergent large vessel occlusion. 2. Moderate stenosis bilateral ICA. RIGHT vertebral artery, anterior, middle and posterior cerebral arteries multifocal moderate stenoses compatible with atherosclerosis. CT PERFUSION: 1. 4 cc LEFT occipital penumbra without core infarct. Critical Value/emergent results text paged to Dr.MCNEILL [facility] via AMION secure system on 11/19/2018 at 3:57 am, including interpreting physician's phone number. Electronically Signed   By: Elon Alas M.D.   On: 11/19/2018 04:00  Ct Angio Neck W Or Wo Contrast  Result Date: 11/19/2018 CLINICAL DATA:  Aphasia and confusion. Follow up code stroke. History of LEFT carotid endarterectomy, hypertension and diabetes. EXAM: CT ANGIOGRAPHY HEAD AND NECK CT PERFUSION BRAIN TECHNIQUE: Multidetector CT imaging of the head and neck was performed using the standard protocol during bolus administration of intravenous contrast. Multiplanar CT image reconstructions and MIPs were obtained to evaluate the vascular anatomy. Carotid stenosis measurements (when applicable) are obtained utilizing NASCET criteria, using the distal internal carotid diameter as the denominator. Multiphase CT imaging of the brain was performed following IV bolus contrast injection. Subsequent parametric perfusion maps were calculated using RAPID software. CONTRAST:  125mL OMNIPAQUE IOHEXOL 350 MG/ML SOLN COMPARISON:  CT HEAD November 19, 2018 and CT angiogram neck August 01, 2017  FINDINGS: CTA NECK FINDINGS: AORTIC ARCH: Normal appearance of the thoracic arch, 2 vessel arch is a normal variant. Mild calcific atherosclerosis and intimal thickening. The origins of the innominate, left Common carotid artery and subclavian artery are patent. RIGHT CAROTID SYSTEM: Common carotid artery is patent. Moderate calcific atherosclerosis of the carotid bifurcation without hemodynamically significant stenosis by NASCET criteria. Normal appearance of the internal carotid artery. LEFT CAROTID SYSTEM: Common carotid artery is patent, moderate intimal thickening and mild stenosis. Status post LEFT carotid endarterectomy. Within 1 cm of the origin is and 11 mm segment of 60% stenosis by NASCET criteria. VERTEBRAL ARTERIES:Codominant vertebral arteries. Moderate to severe bilateral V1 segment stenosis. SKELETON: No acute osseous process though bone windows have not been submitted. OTHER NECK: Soft tissues of the neck are nonacute though, not tailored for evaluation. UPPER CHEST: Small bilateral pleural effusions. Interlobular septal thickening, bronchial wall thickening and mosaic attenuation seen with pulmonary edema. Status post median sternotomy. CTA HEAD FINDINGS: ANTERIOR CIRCULATION: Patent cervical internal carotid arteries, petrous, cavernous and supra clinoid internal carotid arteries. Calcific atherosclerosis resulting in moderate stenosis bilateral carotid siphon. Patent anterior communicating artery. Patent anterior and middle cerebral arteries. Moderate stenosis RIGHT A2 segment, RIGHT M2 No large vessel occlusion, flow-limiting stenosis, contrast extravasation or aneurysm. POSTERIOR CIRCULATION: Patent vertebral arteries, vertebrobasilar junction and basilar artery, as well as main branch vessels. Calcific atherosclerosis resulting in moderate stenosis moderate stenosis distal RIGHT V4 segment. Patent posterior cerebral arteries, moderate luminal irregularity compatible with atherosclerosis. No  large vessel occlusion, flow-limiting stenosis, contrast extravasation or aneurysm. VENOUS SINUSES: Major dural venous sinuses are patent though not tailored for evaluation on this angiographic examination. ANATOMIC VARIANTS: Hypoplastic LEFT A1 segment. DELAYED PHASE: Not performed. MIP images reviewed. CT Brain Perfusion Findings: CBF (<30%) Volume: 21mL Perfusion (Tmax>6.0s) volume: 102mL Mismatch Volume: 91mL Infarction Location:LEFT occipital lobe. IMPRESSION: CTA NECK: 1. Status post LEFT carotid endarterectomy with 60% stenosis LEFT ICA by NASCET criteria. 2. Patent vertebral arteries. Moderate to severe stenosis bilateral V1 segments. 3. Small pleural effusions and findings of pulmonary edema. Recommend chest radiograph. CTA HEAD: 1. No emergent large vessel occlusion. 2. Moderate stenosis bilateral ICA. RIGHT vertebral artery, anterior, middle and posterior cerebral arteries multifocal moderate stenoses compatible with atherosclerosis. CT PERFUSION: 1. 4 cc LEFT occipital penumbra without core infarct. Critical Value/emergent results text paged to Dr.MCNEILL [facility] via AMION secure system on 11/19/2018 at 3:57 am, including interpreting physician's phone number. Electronically Signed   By: Elon Alas M.D.   On: 11/19/2018 04:00   Ct Cerebral Perfusion W Contrast  Result Date: 11/19/2018 CLINICAL DATA:  Aphasia and confusion. Follow up code stroke. History of LEFT carotid endarterectomy, hypertension and diabetes. EXAM: CT ANGIOGRAPHY HEAD  AND NECK CT PERFUSION BRAIN TECHNIQUE: Multidetector CT imaging of the head and neck was performed using the standard protocol during bolus administration of intravenous contrast. Multiplanar CT image reconstructions and MIPs were obtained to evaluate the vascular anatomy. Carotid stenosis measurements (when applicable) are obtained utilizing NASCET criteria, using the distal internal carotid diameter as the denominator. Multiphase CT imaging of the brain was  performed following IV bolus contrast injection. Subsequent parametric perfusion maps were calculated using RAPID software. CONTRAST:  157mL OMNIPAQUE IOHEXOL 350 MG/ML SOLN COMPARISON:  CT HEAD November 19, 2018 and CT angiogram neck August 01, 2017 FINDINGS: CTA NECK FINDINGS: AORTIC ARCH: Normal appearance of the thoracic arch, 2 vessel arch is a normal variant. Mild calcific atherosclerosis and intimal thickening. The origins of the innominate, left Common carotid artery and subclavian artery are patent. RIGHT CAROTID SYSTEM: Common carotid artery is patent. Moderate calcific atherosclerosis of the carotid bifurcation without hemodynamically significant stenosis by NASCET criteria. Normal appearance of the internal carotid artery. LEFT CAROTID SYSTEM: Common carotid artery is patent, moderate intimal thickening and mild stenosis. Status post LEFT carotid endarterectomy. Within 1 cm of the origin is and 11 mm segment of 60% stenosis by NASCET criteria. VERTEBRAL ARTERIES:Codominant vertebral arteries. Moderate to severe bilateral V1 segment stenosis. SKELETON: No acute osseous process though bone windows have not been submitted. OTHER NECK: Soft tissues of the neck are nonacute though, not tailored for evaluation. UPPER CHEST: Small bilateral pleural effusions. Interlobular septal thickening, bronchial wall thickening and mosaic attenuation seen with pulmonary edema. Status post median sternotomy. CTA HEAD FINDINGS: ANTERIOR CIRCULATION: Patent cervical internal carotid arteries, petrous, cavernous and supra clinoid internal carotid arteries. Calcific atherosclerosis resulting in moderate stenosis bilateral carotid siphon. Patent anterior communicating artery. Patent anterior and middle cerebral arteries. Moderate stenosis RIGHT A2 segment, RIGHT M2 No large vessel occlusion, flow-limiting stenosis, contrast extravasation or aneurysm. POSTERIOR CIRCULATION: Patent vertebral arteries, vertebrobasilar junction and  basilar artery, as well as main branch vessels. Calcific atherosclerosis resulting in moderate stenosis moderate stenosis distal RIGHT V4 segment. Patent posterior cerebral arteries, moderate luminal irregularity compatible with atherosclerosis. No large vessel occlusion, flow-limiting stenosis, contrast extravasation or aneurysm. VENOUS SINUSES: Major dural venous sinuses are patent though not tailored for evaluation on this angiographic examination. ANATOMIC VARIANTS: Hypoplastic LEFT A1 segment. DELAYED PHASE: Not performed. MIP images reviewed. CT Brain Perfusion Findings: CBF (<30%) Volume: 86mL Perfusion (Tmax>6.0s) volume: 32mL Mismatch Volume: 88mL Infarction Location:LEFT occipital lobe. IMPRESSION: CTA NECK: 1. Status post LEFT carotid endarterectomy with 60% stenosis LEFT ICA by NASCET criteria. 2. Patent vertebral arteries. Moderate to severe stenosis bilateral V1 segments. 3. Small pleural effusions and findings of pulmonary edema. Recommend chest radiograph. CTA HEAD: 1. No emergent large vessel occlusion. 2. Moderate stenosis bilateral ICA. RIGHT vertebral artery, anterior, middle and posterior cerebral arteries multifocal moderate stenoses compatible with atherosclerosis. CT PERFUSION: 1. 4 cc LEFT occipital penumbra without core infarct. Critical Value/emergent results text paged to Dr.MCNEILL [facility] via AMION secure system on 11/19/2018 at 3:57 am, including interpreting physician's phone number. Electronically Signed   By: Elon Alas M.D.   On: 11/19/2018 04:00   Dg Chest Port 1 View  Result Date: 11/20/2018 CLINICAL DATA:  Hypoxia.  History of endotracheal tube EXAM: PORTABLE CHEST 1 VIEW COMPARISON:  Yesterday FINDINGS: Endotracheal tube tip just below the clavicular heads. The orogastric tube is difficult to visualize separate from overlapping EKG leads. The side port appears to overlap the thoracic inlet, need correlation with catheter length. Low volume chest  with hazy opacity at  the bases. There may be pleural fluid. Stable heart size. CABG. No pneumothorax. These results will be called to the ordering clinician or representative by the Radiologist Assistant, and communication documented in the PACS or zVision Dashboard. IMPRESSION: 1. Uncertain positioning of the orogastric tube, the side port appears to project over the thoracic inlet-please correlate with catheter length. 2. Stable low volume chest. Electronically Signed   By: Monte Fantasia M.D.   On: 11/20/2018 07:01   Portable Chest X-ray  Result Date: 11/19/2018 CLINICAL DATA:  Endotracheal tube placement EXAM: PORTABLE CHEST 1 VIEW COMPARISON:  11/19/2018 FINDINGS: Endotracheal tube with the tip 5 cm above above the carina. Nasogastric tube coursing below the diaphragm. Bilateral mild interstitial thickening similar in appearance to the prior exam. No pleural effusion or pneumothorax. Stable cardiomediastinal silhouette. Prior CABG. IMPRESSION: 1. Endotracheal tube with the tip 5 cm above the carina. 2. Persistent mild CHF. Electronically Signed   By: Kathreen Devoid   On: 11/19/2018 11:28   Dg Chest Portable 1 View  Result Date: 11/19/2018 CLINICAL DATA:  Hypoxia.  Facial droop EXAM: PORTABLE CHEST 1 VIEW COMPARISON:  08/25/2017 FINDINGS: Diffuse interstitial coarsening with Kerley lines. Cardiomegaly. Prior CABG. There is pleural fluid by preceding neck CTA. IMPRESSION: CHF pattern. Electronically Signed   By: Monte Fantasia M.D.   On: 11/19/2018 04:14   Dg Abd Portable 1v  Result Date: 11/20/2018 CLINICAL DATA:  Impaired orogastric feeding tube EXAM: PORTABLE ABDOMEN - 1 VIEW COMPARISON:  Yesterday FINDINGS: The orogastric tube tip is loops through the lower esophagus with tip at the thoracic inlet based on prior chest x-ray. Relatively gasless abdomen without obstructive process or concerning mass effect. These results will be called to the ordering clinician or representative by the Radiologist Assistant, and  communication documented in the PACS or zVision Dashboard. IMPRESSION: Malpositioned orogastric tube which loops through the lower esophagus with tip in the neck region. Electronically Signed   By: Monte Fantasia M.D.   On: 11/20/2018 07:06   Dg Abd Portable 1v  Result Date: 11/20/2018 CLINICAL DATA:  OG tube placement attempt #4 EXAM: PORTABLE ABDOMEN - 1 VIEW COMPARISON:  11/19/2018 at 2159 hours FINDINGS: Enteric tube looped in the distal esophagus with its tip not visualized above the upper edge of the image. IMPRESSION: Enteric tube looped in the distal esophagus with its tip not visualized above the upper edge of the image. Electronically Signed   By: Julian Hy M.D.   On: 11/20/2018 01:10   Dg Abd Portable 1v  Result Date: 11/20/2018 CLINICAL DATA:  OG tube attempt #3 EXAM: PORTABLE ABDOMEN - 1 VIEW COMPARISON:  11/19/2018 at 2159 hours FINDINGS: Enteric tube terminates in the distal esophagus. IMPRESSION: Enteric tube terminates in the distal esophagus. Electronically Signed   By: Julian Hy M.D.   On: 11/20/2018 01:09   Dg Abd Portable 1v  Result Date: 11/20/2018 CLINICAL DATA:  NG tube placement #2 EXAM: PORTABLE ABDOMEN - 1 VIEW COMPARISON:  11/19/2018 2146 hours FINDINGS: Enteric tube terminates at the GE junction. IMPRESSION: Enteric tube terminates at the GE junction. Electronically Signed   By: Julian Hy M.D.   On: 11/20/2018 01:08   Dg Abd Portable 1v  Result Date: 11/20/2018 CLINICAL DATA:  Nasogastric tube placement. EXAM: PORTABLE ABDOMEN - 1 VIEW COMPARISON:  None. FINDINGS: Nasogastric tube tip projects at GE junction. Contrast in the urinary collecting system, no hydronephrosis. No intra-abdominal mass effect. Bowel gas pattern is nondilated and  nonobstructive. Soft tissue planes and included osseous structures are non suspicious. IMPRESSION: Nasogastric tube tip projects at GE junction.  Sing spine Electronically Signed   By: Elon Alas M.D.   On:  11/20/2018 00:22   Ct Head Code Stroke Wo Contrast  Addendum Date: 11/19/2018   ADDENDUM REPORT: 11/19/2018 03:45 ADDENDUM: Critical Value/emergent results text paged to [place], Neurology via AMION secure system on 11/19/2018 at 3:35 am, including interpreting physician's phone number. Electronically Signed   By: Elon Alas M.D.   On: 11/19/2018 03:45   Result Date: 11/19/2018 CLINICAL DATA:  Code stroke. Aphasia and confusion. History of LEFT carotid endarterectomy, hypertension and diabetes. EXAM: CT HEAD WITHOUT CONTRAST TECHNIQUE: Contiguous axial images were obtained from the base of the skull through the vertex without intravenous contrast. COMPARISON:  None. FINDINGS: BRAIN: No intraparenchymal hemorrhage, mass effect nor midline shift. No parenchymal brain volume loss for age. No hydrocephalus. Patchy supratentorial white matter hypodensities less than expected for patient's age, though non-specific are most compatible with chronic small vessel ischemic disease. Cystic RIGHT basal ganglia lacunar infarcts. No acute large vascular territory infarcts. No abnormal extra-axial fluid collections. Basal cisterns are patent. VASCULAR: Mild-to-moderate calcific atherosclerosis of the carotid siphons. SKULL: No skull fracture. No significant scalp soft tissue swelling. SINUSES/ORBITS: Trace paranasal sinus mucosal thickening. Mastoid air cells are well aerated.The included ocular globes and orbital contents are non-suspicious. Status post bilateral ocular lens implants. OTHER: None. ASPECTS [facility] Stroke Program Early CT Score) - Ganglionic level infarction (caudate, lentiform nuclei, internal capsule, insula, M1-M3 cortex): 7 - Supraganglionic infarction (M4-M6 cortex): 3 Total score (0-10 with 10 being normal): 10 IMPRESSION: 1. No acute intracranial process. 2. ASPECTS is 10. 3. 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 Electronically Signed: By:  Elon Alas M.D. On: 11/19/2018 03:36    PHYSICAL EXAM: Elderly Caucasian male who is sedated intubated and paralyzed. . Afebrile. Head is nontraumatic. Neck is supple without bruit.    Cardiac exam no murmur or gallop. Lungs are clear to auscultation. Distal pulses are well felt. Neurological Exam : limited due to video telemetry consultation and intubation Eyes are closed. pupils cannot be examined..patient was able to follow gaze in all directions upon command by his nurse. He was able to smile symmetrically. He was able to move both upper and lower extremities against gravity without drift or focal weakness. Sensory system exam was not done. Coordination symmetric. Gait not tested.  ASSESSMENT/PLAN Mr. BLAYKE CORDREY is a 78 y.o. male with history of HTN, HLD , DM, ALCOHOL abuse presenting with altered mental status and R facial droop, R arm drift. Found to have fever with tachycardia and hypoxia. D/t rapid progression, LP performed. [name]   Possible L brain Stroke  Code Stroke CT head No acute stroke. Old R BG lacune. ASPECTS 10.     CTA head no LVO. Mod B ICA stenosis. R VA, ACA, MCA and PCA atherosclerosis   CTA neck s/p L CEA w/ 60% stenosis. Mod to severe B VA stenosis V1. Small pleural effusion and pulm edema  CT perfusion 4cc L occipital penumbra w/o core infarct  CXR CHF  LP CSF RBC 27, WBC 2, Glu 141, TP 52  UDS neg  MRI  pending   LDL 67  HgbA1c 6.5  Heparin 5000 units sq tid for VTE prophylaxis Diet Order            Diet NPO time specified  Diet effective now  aspirin 81 mg daily prior to admission, now on aspirin 300 mg suppository daily or 325 mg per tube daily.   Therapy recommendations:  pending   Disposition:  pending   Acute Respiratory Failure with hypoxemia Close exposure to COVID  Febrile  TM 104.2  Lactic acid 5.9  Intubated   Novel coronavirus pending   Hypotension Hx Hypertension . If acute stroke, Permissive  hypertension (OK if < 220/120) but gradually normalize in 5-7 days . Long-term BP goal normotensive  Hyperlipidemia  Home meds:  zocor 40  Not on statin in hospital  LDL 67, goal < 70 if stroke  Recommend Continuation of statin at discharge  Diabetes type II, controlled  HgbA1c 6.5, goal < 7.0  DB RN coordinator following. Consider adding levemir 10 bid.  Other Stroke Risk Factors  Advanced age  Hx ETOH abuse   Obesity, Body mass index is 33.58 kg/m., recommend weight loss, diet and exercise as appropriate   Coronary artery disease  Obstructive sleep apnea, on CPAP at home  Hospital day # 1  Patient seems to be neurologically improving without obvious focal neurological deficits though neurological exam is limited due to constraints of video consolidation. Patient can be extubated from neurological standpoint but it would be reasonable to wait till his corona virus test comes back. MRI scan of the brain will also be obtained for this. Discussed with patient, his bedside nurse and Dr. Nelda Marseille critical care medicine and answered questions. I provided 32 minutes of non-face-to-face time during this encounter. This patient is critically ill and at significant risk of neurological worsening, death and care requires constant monitoring of vital signs, hemodynamics,respiratory and cardiac monitoring, extensive review of multiple databases, frequent neurological assessment, discussion with family, other specialists and medical decision making of high complexity.I have made any additions or clarifications directly to the above note.This critical care time does not reflect procedure time, or teaching time or supervisory time of PA/NP/Med Resident etc but could involve care discussion time.  I spent 32 minutes of neurocritical care time  in the care of  this patient.      Antony Contras, MD Antony Contras, MD Medical Director [facility] Pager: [phone] 11/20/2018 8:39  AM   To contact Stroke Continuity provider, please refer to [email or web address] After hours, contact General Neurology

## 2018-11-20 NOTE — Progress Notes (Signed)
ABG Results not crossing over on ISTAT. PH 7.44, CO2 38.7, PAO2 276, HCO3 26.1, SAO2 100

## 2018-11-20 NOTE — Progress Notes (Signed)
Attempted for the third time to place NG/OG with a 45F. I did meet resistance before the extent of my measurement but did hear auscultation of air. Received stat x-ray. However radiologist states that the tube is still in the GE junction. Will await further orders.

## 2018-11-20 NOTE — Progress Notes (Signed)
E-Link Tele-conference held at the patient bedside with Dr. Leonie Man and Autumn RN.

## 2018-11-20 NOTE — Progress Notes (Addendum)
[place] Progress Note Patient Name: Larry [name] DOB: August 29, 1940 MRN: [id number]   Date of Service  11/20/2018  HPI/Events of Note  Notified by bedside ICU RN that OGT could not be advanced further.  Abdominal xray shows the tip of the OGT terminating at the GE junction.   eICU Interventions  Ok to give plaquenil via tube.      Intervention Category Minor Interventions: Other:  Elsie Lincoln 11/20/2018, 9:25 PM   9:59 PM Upon review of EKG, QTc is prolonged at 560. Hold Plaquenil for now.  Repeat EKG in the AM.     4:49 AM Pt trying to get out of bed and almost self-extubated.  Pt is on fentanyl.  He will get Versed. Restraints ordered.

## 2018-11-21 ENCOUNTER — Inpatient Hospital Stay (HOSPITAL_COMMUNITY): Payer: Medicare Other

## 2018-11-21 DIAGNOSIS — Z9911 Dependence on respirator [ventilator] status: Secondary | ICD-10-CM

## 2018-11-21 DIAGNOSIS — B999 Unspecified infectious disease: Secondary | ICD-10-CM

## 2018-11-21 LAB — CBC
HCT: 41.9 % (ref 39.0–52.0)
Hemoglobin: 13.3 g/dL (ref 13.0–17.0)
MCH: 29.4 pg (ref 26.0–34.0)
MCHC: 31.7 g/dL (ref 30.0–36.0)
MCV: 92.7 fL (ref 80.0–100.0)
Platelets: 198 10*3/uL (ref 150–400)
RBC: 4.52 MIL/uL (ref 4.22–5.81)
RDW: 12.6 % (ref 11.5–15.5)
WBC: 15.1 10*3/uL — ABNORMAL HIGH (ref 4.0–10.5)
nRBC: 0 % (ref 0.0–0.2)

## 2018-11-21 LAB — RESPIRATORY PANEL BY PCR

## 2018-11-21 LAB — BASIC METABOLIC PANEL
Anion gap: 13 (ref 5–15)
BUN: 24 mg/dL — ABNORMAL HIGH (ref 8–23)
CO2: 20 mmol/L — ABNORMAL LOW (ref 22–32)
Calcium: 8.6 mg/dL — ABNORMAL LOW (ref 8.9–10.3)
Chloride: 109 mmol/L (ref 98–111)
Creatinine, Ser: 1.55 mg/dL — ABNORMAL HIGH (ref 0.61–1.24)
GFR calc Af Amer: 49 mL/min — ABNORMAL LOW (ref >60)
GFR calc non Af Amer: 43 mL/min — ABNORMAL LOW (ref >60)
Glucose, Bld: 174 mg/dL — ABNORMAL HIGH (ref 70–99)
Potassium: 3.5 mmol/L (ref 3.5–5.1)
Sodium: 142 mmol/L (ref 135–145)

## 2018-11-21 LAB — GLUCOSE, CAPILLARY
Glucose-Capillary: 115 mg/dL — ABNORMAL HIGH (ref 70–99)
Glucose-Capillary: 144 mg/dL — ABNORMAL HIGH (ref 70–99)
Glucose-Capillary: 149 mg/dL — ABNORMAL HIGH (ref 70–99)
Glucose-Capillary: 155 mg/dL — ABNORMAL HIGH (ref 70–99)
Glucose-Capillary: 187 mg/dL — ABNORMAL HIGH (ref 70–99)
Glucose-Capillary: 93 mg/dL (ref 70–99)

## 2018-11-21 LAB — NOVEL CORONAVIRUS, NAA (HOSP ORDER, SEND-OUT TO REF LAB; TAT 18-24 HRS): SARS-CoV-2, NAA: NOT DETECTED

## 2018-11-21 LAB — MAGNESIUM: Magnesium: 2.2 mg/dL (ref 1.7–2.4)

## 2018-11-21 LAB — PHOSPHORUS: Phosphorus: 2.9 mg/dL (ref 2.5–4.6)

## 2018-11-21 MED ORDER — POTASSIUM CHLORIDE 20 MEQ PO PACK
40.0000 meq | PACK | ORAL | Status: DC
  Filled 2018-11-21 (×2): qty 2

## 2018-11-21 MED ORDER — ACETAMINOPHEN 325 MG PO TABS
650.0000 mg | ORAL_TABLET | Freq: Four times a day (QID) | ORAL | Status: DC | PRN
  Administered 2018-11-21: 650 mg via ORAL
  Filled 2018-11-21: qty 2

## 2018-11-21 MED ORDER — POTASSIUM CHLORIDE 20 MEQ/15ML (10%) PO SOLN
40.0000 meq | ORAL | Status: AC
  Administered 2018-11-21 (×2): 40 meq via ORAL
  Filled 2018-11-21 (×2): qty 30

## 2018-11-21 MED ORDER — VITAMIN B-1 100 MG PO TABS
100.0000 mg | ORAL_TABLET | Freq: Every day | ORAL | Status: DC
  Administered 2018-11-22 – 2018-11-24 (×3): 100 mg via ORAL
  Filled 2018-11-21 (×3): qty 1

## 2018-11-21 MED ORDER — PANTOPRAZOLE SODIUM 40 MG PO PACK
40.0000 mg | PACK | ORAL | Status: DC
  Administered 2018-11-21: 40 mg
  Filled 2018-11-21: qty 20

## 2018-11-21 MED ORDER — FUROSEMIDE 10 MG/ML IJ SOLN
20.0000 mg | Freq: Once | INTRAMUSCULAR | Status: AC
  Administered 2018-11-21: 20 mg via INTRAVENOUS
  Filled 2018-11-21: qty 2

## 2018-11-21 MED ORDER — FOLIC ACID 1 MG PO TABS
1.0000 mg | ORAL_TABLET | Freq: Every day | ORAL | Status: DC
  Administered 2018-11-22 – 2018-11-24 (×3): 1 mg via ORAL
  Filled 2018-11-21 (×3): qty 1

## 2018-11-21 MED ORDER — AZITHROMYCIN 250 MG PO TABS
500.0000 mg | ORAL_TABLET | ORAL | Status: AC
  Administered 2018-11-22 – 2018-11-23 (×2): 500 mg via ORAL
  Filled 2018-11-21 (×2): qty 1
  Filled 2018-11-21: qty 2

## 2018-11-21 NOTE — Progress Notes (Signed)
Pt noted to be attempting to get out of bed and attempting to grab ETT.  Pt restrained bilaterally at the wrist, on Fentanyl at 366mcg/her and Precedex at 1.0/mcg/kg/hr.  Bolused pt with 85mcg Fentanyl and 1mg  Versed given.  Received orders for bilateral ankle restraints.  Will continue to monitor pt closely.

## 2018-11-21 NOTE — Progress Notes (Addendum)
Virtual Visit via Video Note  I connected with Larry [name]    by a video enabled telemedicine application and verified that I am speaking with the correct person using two identifiers the help of his nurse who was at the bedside throughout this video telemetry consultation.  I  was unable to adequately discuss  the limitations of evaluation and management by telemedicine  with the  patient  As he was sedated and intubated.   STROKE TEAM PROGRESS NOTE  INTERVAL HISTORY Patient is doing well neurologically. He is easily arousable and is able to follow commands and has been moving all 4 extremities purposefully without focal weakness. His prelim test for COVID 19 has come negative but because he had contact with a positive patient he is still being kept on respiratory isolation. Plan is to extubate today.MRI scan of the brain has not been done.  Vitals:   11/21/18 0800 11/21/18 0801 11/21/18 0818 11/21/18 0900  BP: (!) 148/83  (!) 148/83 138/82  Pulse: 86  86 86  Resp: 16  16 16   Temp:  98.6 F (37 C)    TempSrc:  Axillary    SpO2: 100%  100% 100%  Weight:      Height:        CBC:  Recent Labs  Lab 11/19/18 0320  11/20/18 0425 11/21/18 0517  WBC 15.9*  --  16.8* 15.1*  NEUTROABS 13.8*  --   --   --   HGB 15.8   < > 14.1 13.3  HCT 50.0   < > 42.6 41.9  MCV 92.1  --  91.6 92.7  PLT 324  --  208 198   < > = values in this interval not displayed.    Basic Metabolic Panel:  Recent Labs  Lab 11/20/18 0425 11/21/18 0517  NA 140 142  K 4.2 3.5  CL 110 109  CO2 22 20*  GLUCOSE 196* 174*  BUN 30* 24*  CREATININE 1.61* 1.55*  CALCIUM 8.5* 8.6*  MG 1.7 2.2  PHOS 1.6* 2.9   Lipid Panel:     Component Value Date/Time   CHOL 129 11/20/2018 0425   CHOL 137 09/19/2018 0743   TRIG 113 11/20/2018 0425   HDL 38 (L) 11/20/2018 0425   HDL 39 (L) 09/19/2018 0743   CHOLHDL 3.4 11/20/2018 0425   VLDL 23 11/20/2018 0425   LDLCALC 68 11/20/2018 0425   LDLCALC 67 09/19/2018 0743    HgbA1c:  Lab Results  Component Value Date   HGBA1C 6.8 (H) 11/20/2018   Urine Drug Screen:     Component Value Date/Time   LABOPIA NONE DETECTED 11/19/2018 0459   COCAINSCRNUR NONE DETECTED 11/19/2018 0459   LABBENZ NONE DETECTED 11/19/2018 0459   AMPHETMU NONE DETECTED 11/19/2018 0459   THCU NONE DETECTED 11/19/2018 0459   LABBARB NONE DETECTED 11/19/2018 0459    Alcohol Level     Component Value Date/Time   ETH <10 11/19/2018 0320    IMAGING Dg Chest Port 1 View  Result Date: 11/21/2018 CLINICAL DATA:  Intubation EXAM: PORTABLE CHEST 1 VIEW COMPARISON:  Yesterday FINDINGS: Interval improvement in orogastric tube positioning with tip now overlapping the stomach. Endotracheal tube tip between the clavicular heads and carina. Cardiomegaly. CABG. Low volume chest with hazy opacity at the bases, stable. There may be layering pleural fluid. No pneumothorax. IMPRESSION: 1. Improved orogastric tube positioning with tip over the proximal stomach. 2. Stable low volume chest. Electronically Signed   By: Angelica Chessman  Watts M.D.   On: 11/21/2018 06:10   Dg Chest Port 1 View  Result Date: 11/20/2018 CLINICAL DATA:  Hypoxia.  History of endotracheal tube EXAM: PORTABLE CHEST 1 VIEW COMPARISON:  Yesterday FINDINGS: Endotracheal tube tip just below the clavicular heads. The orogastric tube is difficult to visualize separate from overlapping EKG leads. The side port appears to overlap the thoracic inlet, need correlation with catheter length. Low volume chest with hazy opacity at the bases. There may be pleural fluid. Stable heart size. CABG. No pneumothorax. These results will be called to the ordering clinician or representative by the Radiologist Assistant, and communication documented in the PACS or zVision Dashboard. IMPRESSION: 1. Uncertain positioning of the orogastric tube, the side port appears to project over the thoracic inlet-please correlate with catheter length. 2. Stable low volume chest.  Electronically Signed   By: Monte Fantasia M.D.   On: 11/20/2018 07:01   Portable Chest X-ray  Result Date: 11/19/2018 CLINICAL DATA:  Endotracheal tube placement EXAM: PORTABLE CHEST 1 VIEW COMPARISON:  11/19/2018 FINDINGS: Endotracheal tube with the tip 5 cm above above the carina. Nasogastric tube coursing below the diaphragm. Bilateral mild interstitial thickening similar in appearance to the prior exam. No pleural effusion or pneumothorax. Stable cardiomediastinal silhouette. Prior CABG. IMPRESSION: 1. Endotracheal tube with the tip 5 cm above the carina. 2. Persistent mild CHF. Electronically Signed   By: Kathreen Devoid   On: 11/19/2018 11:28   Dg Abd Portable 1v  Result Date: 11/20/2018 CLINICAL DATA:  Orogastric tube placement. EXAM: PORTABLE ABDOMEN - 1 VIEW COMPARISON:  11/20/2018 at 4:22 a.m. FINDINGS: Orogastric tube tip now projects at the gastroesophageal junction. It does not appear to enter the stomach. IMPRESSION: Orogastric tube tip projects at the gastroesophageal junction. This will need to be further inserted, approximately 10 cm, to allow the tube to fully into the stomach. Electronically Signed   By: Lajean Manes M.D.   On: 11/20/2018 09:24   Dg Abd Portable 1v  Result Date: 11/20/2018 CLINICAL DATA:  Impaired orogastric feeding tube EXAM: PORTABLE ABDOMEN - 1 VIEW COMPARISON:  Yesterday FINDINGS: The orogastric tube tip is loops through the lower esophagus with tip at the thoracic inlet based on prior chest x-ray. Relatively gasless abdomen without obstructive process or concerning mass effect. These results will be called to the ordering clinician or representative by the Radiologist Assistant, and communication documented in the PACS or zVision Dashboard. IMPRESSION: Malpositioned orogastric tube which loops through the lower esophagus with tip in the neck region. Electronically Signed   By: Monte Fantasia M.D.   On: 11/20/2018 07:06   Dg Abd Portable 1v  Result Date:  11/20/2018 CLINICAL DATA:  OG tube placement attempt #4 EXAM: PORTABLE ABDOMEN - 1 VIEW COMPARISON:  11/19/2018 at 2159 hours FINDINGS: Enteric tube looped in the distal esophagus with its tip not visualized above the upper edge of the image. IMPRESSION: Enteric tube looped in the distal esophagus with its tip not visualized above the upper edge of the image. Electronically Signed   By: Julian Hy M.D.   On: 11/20/2018 01:10   Dg Abd Portable 1v  Result Date: 11/20/2018 CLINICAL DATA:  OG tube attempt #3 EXAM: PORTABLE ABDOMEN - 1 VIEW COMPARISON:  11/19/2018 at 2159 hours FINDINGS: Enteric tube terminates in the distal esophagus. IMPRESSION: Enteric tube terminates in the distal esophagus. Electronically Signed   By: Julian Hy M.D.   On: 11/20/2018 01:09   Dg Abd Portable 1v  Result Date:  11/20/2018 CLINICAL DATA:  NG tube placement #2 EXAM: PORTABLE ABDOMEN - 1 VIEW COMPARISON:  11/19/2018 2146 hours FINDINGS: Enteric tube terminates at the GE junction. IMPRESSION: Enteric tube terminates at the GE junction. Electronically Signed   By: Julian Hy M.D.   On: 11/20/2018 01:08   Dg Abd Portable 1v  Result Date: 11/20/2018 CLINICAL DATA:  Nasogastric tube placement. EXAM: PORTABLE ABDOMEN - 1 VIEW COMPARISON:  None. FINDINGS: Nasogastric tube tip projects at GE junction. Contrast in the urinary collecting system, no hydronephrosis. No intra-abdominal mass effect. Bowel gas pattern is nondilated and nonobstructive. Soft tissue planes and included osseous structures are non suspicious. IMPRESSION: Nasogastric tube tip projects at GE junction.  Sing spine Electronically Signed   By: Elon Alas M.D.   On: 11/20/2018 00:22    PHYSICAL EXAM:: Elderly Caucasian male who is   intubated and not in distress. . Afebrile. Head is nontraumatic.  Neurological Exam : limited due to video telemetry consultation and intubationi.patient can be easily aroused but Eyes are closed. pupils cannot be  examined..patient was able to follow gaze in all directions upon command by his nurse. He was able to smile symmetrically. He was able to move both upper and lower extremities against gravity without drift or focal weakness. Sensory system exam was not done. Coordination symmetric. Gait not tested.  ASSESSMENT/PLAN [name] is a 78 y.o. male with history of HTN, HLD , DM, ALCOHOL abuse presenting with altered mental status and R facial droop, R arm drift. Found to have fever with tachycardia and hypoxia. D/t rapid progression, LP performed. [name]   Possible L brain Stroke  Code Stroke CT head No acute stroke. Old R BG lacune. ASPECTS 10.     CTA head no LVO. Mod B ICA stenosis. R VA, ACA, MCA and PCA atherosclerosis   CTA neck s/p L CEA w/ 60% stenosis. Mod to severe B VA stenosis V1. Small pleural effusion and pulm edema  CT perfusion 4cc L occipital penumbra w/o core infarct  Initial CXR CHF  LP CSF RBC 27, WBC 2, Glu 141, TP 52  UDS neg  MRI  pending    LDL 67  HgbA1c 6.5  Lovenox 50 mg sq daily sq tid for VTE prophylaxis Diet Order            Diet NPO time specified  Diet effective now              aspirin 81 mg daily prior to admission, now on aspirin 300 mg suppository daily or 325 mg per tube daily.   Therapy recommendations:  pending   Disposition:  pending   Acute on Chronic Respiratory Failure with hypoxemia Close exposure to COVID  Febrile  TM 99.6  WBC 15.1  Lactic acid 5.9  Intubated   Novel coronavirus negative. Coworker positive. Per ID, continue isolation given pt exposure.  Hypotension Hx Hypertension . If acute stroke, Permissive hypertension (OK if < 220/120) but gradually normalize in 5-7 days . Long-term BP goal normotensive  Hyperlipidemia  Home meds:  zocor 40  Not on statin in hospital  LDL 67, goal < 70 if stroke  Recommend Continuation of statin at discharge  Diabetes type II, controlled  HgbA1c 6.5, goal <  7.0  DB RN coordinator following. Consider adding levemir 10 bid.  Other Stroke Risk Factors  Advanced age  Hx ETOH abuse   Obesity, Body mass index is 33.1 kg/m., recommend weight loss, diet and exercise as appropriate  Coronary artery disease  Obstructive sleep apnea, on CPAP at home  Other Problems  AKI Cr 1.55  Hospital day # 2   patient neurological status status appears to have improved. Initial CT scan was negative and MRI has not yet been obtained due to constraints of COVID 19.recommend extubate. Will obtain brain imaging when it is safe to transport patient later as patient is still remains on respiratory isolation. Continue aspirin. Aggressive risk factor modification. Discussed with Dr. Valeta Harms from critical care medicine and patient's bedside nurse. Time spent in this non-face-to-face video consult visit  30 minutes This patient is critically ill and at significant risk of neurological worsening, death and care requires constant monitoring of vital signs, hemodynamics,respiratory and cardiac monitoring, extensive review of multiple databases, frequent neurological assessment, discussion with family, other specialists and medical decision making of high complexity.I have made any additions or clarifications directly to the above note.This critical care time does not reflect procedure time, or teaching time or supervisory time of PA/NP/Med Resident etc but could involve care discussion time.  I spent 30 minutes of neurocritical care time  in the care of  this patient. I called the patient's wife listed cell phone and home number but was unable to reach her.    Antony Contras, MD Antony Contras, MD Medical Director [facility] St[facility] Pager: [phone] 11/21/2018 9:49 AM   To contact Stroke Continuity provider, please refer to [email or web address] After hours, contact General Neurology

## 2018-11-21 NOTE — Progress Notes (Signed)
Name: Larry [name] MRN: [id number] DOB: 1941/06/05 Cc: AMS   LOS: 2  [place] Critical Care Note    Presume +                                                    COVD testing negative Known COVID + Contact Continue AirBorne Precautions    History of Present Illness:  78 yo male with hx of HTN, HLD , DM, ALCOHOL abuse presenting with altered mental status and right facial droop. Last seen ok at 6 pm last night. He was a code stroke, CT's performed , no big stroke found and seen by Neurology. He then was seen febrile, still altered and LP was performed. Then noted to be hypoxic and requiring 4 L [place]. CXR with diffuse infiltrates. CCM consuLTED During our evaluation the patient is AAO3 but confused and acting strangely , he is protecting his airway and speaking in full sentences. Denies any cp , n/v/d.  Unable to complete full ROS given pts mental statust.  Lines / Drains:PIV ETT 4/1>>>  Cultures:  Blood 4/1>>> Sputum 4/1>>> Urine 4/1>>>No Growth RVP 4/1>>> Covid 4/1>>>Not detected, But presumed positive due to Covid + contact CSF Culture 4/1>> Antibiotics:  Azithromycin 4/1>>> Rocephin 4/1>> Plaquenil 4/1>>  Tests / Events: LP done in ED and code stroke ETT 4/1  Vital Signs: Temp:  [98.5 F (36.9 C)-99.6 F (37.6 C)] 98.6 F (37 C) (04/03 0801) Pulse Rate:  [60-90] 86 (04/03 0900) Resp:  [13-21] 16 (04/03 0900) BP: (127-156)/(58-99) 138/82 (04/03 0900) SpO2:  [100 %] 100 % (04/03 0900) FiO2 (%):  [40 %] 40 % (04/03 0900) Weight:  [110.7 kg] 110.7 kg (04/03 0500) I/O last 3 completed shifts: In: 2505.3 [I.V.:1128; NG/GT:130; IV Piggyback:1247.3] Out: 1195 [Urine:1195]  Physical Examination: General: Acutely ill appearing male, NAD, intubated and minimally sedated Neuro:  MAE x 4, follows commands HEENT: [place]/AT, PERRL, EOM-I and MMM, ETT in place and secure CV: RRR, Nl S1/s2 and -M/R/G PULM: Bilateral chest excursion, Clear ventilated breath sounds, diminished  per bases GI: Soft, NT, ND and +BS, obese Extremities:  pitting edema in the LE b/l, 1+ Skin: Intact, warm and dry  Ventilator settings:  Vent Mode: PRVC FiO2 (%):  [40 %] 40 % Set Rate:  [16 bmp] 16 bmp Vt Set:  [500 mL] 500 mL PEEP:  [5 cmH20-8 cmH20] 5 cmH20 Plateau Pressure:  [15 cmH20-20 cmH20] 15 cmH20  Labs    CBC Recent Labs  Lab 11/19/18 0320  11/19/18 1139 11/20/18 0425 11/21/18 0517  HGB 15.8   < > 13.6 14.1 13.3  HCT 50.0   < > 40.0 42.6 41.9  WBC 15.9*  --   --  16.8* 15.1*  PLT 324  --   --  208 198   < > = values in this interval not displayed.   BMET Recent Labs  Lab 11/19/18 0320 11/19/18 0402 11/19/18 0549 11/19/18 1139 11/20/18 0425 11/21/18 0517  NA 137  --  137 141 140 142  K 4.6  --  4.2 4.1 4.2 3.5  CL 100  --   --   --  110 109  CO2 22  --   --   --  22 20*  GLUCOSE 325*  --   --   --  196* 174*  BUN 21  --   --   --  30* 24*  CREATININE 1.64* 1.40*  --   --  1.61* 1.55*  CALCIUM 9.6  --   --   --  8.5* 8.6*  MG  --   --   --   --  1.7 2.2  PHOS  --   --   --   --  1.6* 2.9   Recent Labs  Lab 11/19/18 0320  INR 1.0   Recent Labs  Lab 11/19/18 0549 11/19/18 1139  PHART 7.327* 7.350  PCO2ART 36.1 43.5  PO2ART 73.0* 501.0*  HCO3 18.4* 23.4  TCO2 19* 25  O2SAT 90.0 100.0     Assessment and Plan: Principal Problem:   Acute respiratory failure with hypoxemia (HCC) Active Problems:   Acute encephalopathy   Close Exposure to Covid-19 Virus   Sepsis (HCC)   Encephalopathy   Cerebral embolism with cerebral infarction  Neuro - toxic metabolic encephalopathy , suspect this is secondary to sepsis Delirium - Sedation via precedex and fentanyl Small CVA on workup today , did not explain his R facial droop symptoms Neurology input appreciated Plan: Per Neuro Continue assessments per unit protocol Minimize sedation as able   Pulmonary/ ID Pt is COVID - 11/21/2018, but presume + as COVID + contact Acute on Chronic Respiratory  Failure Radiology: CXR 4/3>> - Low volume chest with hazy opacity at the bases, stable.  ? layering pleural fluid. No pneumothorax. Patient's coworkers as positive for [employer] 5/5 on 21% Lasix 20 mg x 1 dose  Adjust vent for ABG CXR prn ABG prn RVP and COVID-19 test sent>?> resulted as above CAP coverage Continue Plaquenil Consider extubation  Renal -  Creatinine down trending + 1500 cc's Plan: Keep dry as able Trend BMET/ UO Lasix 20 x 1 on 4/3   Cards HD stable , tachycardic because of fever TTE last year EF 45% Plan Tele  MAP > 65 Support as needed   GI -  TF per nutrition Protonix as ordered   Endo- DM2 , NISS Plan CBG Q 4 SSI as needed  Best practices / Disposition: -->Code Status: full -->DVT Px: HSQ -->GI VX:[id number] -->Diet:TF  Continue AIRBORNE PRECAUTIONS   Magdalen Spatz, AGACNP-BC [place] Pager # [phone] If no answer [phone] 11/21/2018 9:59 AM

## 2018-11-21 NOTE — Procedures (Signed)
Extubation Procedure Note  Patient Details:   Name: Larry [name] DOB: 07-06-1941 MRN: [id number]   Airway Documentation:  Airway 8 mm (Active)  Secured at (cm) 25 cm 11/21/2018  8:18 AM  Measured From Lips 11/21/2018  8:18 AM  Secured Location Left 11/21/2018  8:18 AM  Secured By [employer] 11/21/2018  8:18 AM  Tube Holder Repositioned Yes 11/21/2018  8:18 AM  Cuff Pressure (cm H2O) 28 cm H2O 11/21/2018  8:18 AM  Site Condition Dry 11/21/2018  8:18 AM   Vent end date: (not recorded) Vent end time: (not recorded)   Evaluation  O2 sats: stable throughout Complications: No apparent complications Patient did tolerate procedure well. Bilateral Breath Sounds: Diminished   Yes  RT extubated patient to 2L [place] with RN at bedside per MD order. Positive cuff leak noted. Patient tolerated well. No stridor noted. RT will continue to monitor as needed.  Vernona Rieger 11/21/2018, 4:55 PM

## 2018-11-21 NOTE — Progress Notes (Signed)
Spoke to [employer]. States pt not in need of additional access. Consult cleared.

## 2018-11-22 ENCOUNTER — Other Ambulatory Visit: Payer: Self-pay

## 2018-11-22 ENCOUNTER — Inpatient Hospital Stay (HOSPITAL_COMMUNITY): Payer: Medicare Other

## 2018-11-22 DIAGNOSIS — I639 Cerebral infarction, unspecified: Secondary | ICD-10-CM

## 2018-11-22 DIAGNOSIS — R6520 Severe sepsis without septic shock: Secondary | ICD-10-CM

## 2018-11-22 LAB — CBC
HCT: 41.5 % (ref 39.0–52.0)
Hemoglobin: 12.9 g/dL — ABNORMAL LOW (ref 13.0–17.0)
MCH: 28.9 pg (ref 26.0–34.0)
MCHC: 31.1 g/dL (ref 30.0–36.0)
MCV: 92.8 fL (ref 80.0–100.0)
Platelets: 222 10*3/uL (ref 150–400)
RBC: 4.47 MIL/uL (ref 4.22–5.81)
RDW: 12.7 % (ref 11.5–15.5)
WBC: 13.3 10*3/uL — ABNORMAL HIGH (ref 4.0–10.5)
nRBC: 0 % (ref 0.0–0.2)

## 2018-11-22 LAB — GLUCOSE, CAPILLARY
Glucose-Capillary: 136 mg/dL — ABNORMAL HIGH (ref 70–99)
Glucose-Capillary: 143 mg/dL — ABNORMAL HIGH (ref 70–99)
Glucose-Capillary: 149 mg/dL — ABNORMAL HIGH (ref 70–99)
Glucose-Capillary: 153 mg/dL — ABNORMAL HIGH (ref 70–99)
Glucose-Capillary: 177 mg/dL — ABNORMAL HIGH (ref 70–99)
Glucose-Capillary: 218 mg/dL — ABNORMAL HIGH (ref 70–99)

## 2018-11-22 LAB — CSF CULTURE W GRAM STAIN
Culture: NO GROWTH
Gram Stain: NONE SEEN

## 2018-11-22 LAB — BASIC METABOLIC PANEL
Anion gap: 10 (ref 5–15)
BUN: 28 mg/dL — ABNORMAL HIGH (ref 8–23)
CO2: 24 mmol/L (ref 22–32)
Calcium: 8.9 mg/dL (ref 8.9–10.3)
Chloride: 110 mmol/L (ref 98–111)
Creatinine, Ser: 1.55 mg/dL — ABNORMAL HIGH (ref 0.61–1.24)
GFR calc Af Amer: 49 mL/min — ABNORMAL LOW (ref >60)
GFR calc non Af Amer: 43 mL/min — ABNORMAL LOW (ref >60)
Glucose, Bld: 156 mg/dL — ABNORMAL HIGH (ref 70–99)
Potassium: 3.7 mmol/L (ref 3.5–5.1)
Sodium: 144 mmol/L (ref 135–145)

## 2018-11-22 LAB — CSF CULTURE

## 2018-11-22 LAB — MAGNESIUM: Magnesium: 2.2 mg/dL (ref 1.7–2.4)

## 2018-11-22 MED ORDER — METOPROLOL SUCCINATE ER 50 MG PO TB24
75.0000 mg | ORAL_TABLET | Freq: Every day | ORAL | Status: DC
  Administered 2018-11-22: 75 mg via ORAL
  Filled 2018-11-22: qty 1

## 2018-11-22 MED ORDER — INSULIN ASPART 100 UNIT/ML ~~LOC~~ SOLN: 0.0000 [IU] | Freq: Every day | SUBCUTANEOUS | Status: DC

## 2018-11-22 MED ORDER — CARVEDILOL 12.5 MG PO TABS
12.5000 mg | ORAL_TABLET | Freq: Two times a day (BID) | ORAL | Status: DC
  Administered 2018-11-22 – 2018-11-24 (×5): 12.5 mg via ORAL
  Filled 2018-11-22 (×6): qty 1

## 2018-11-22 MED ORDER — INSULIN ASPART 100 UNIT/ML ~~LOC~~ SOLN
0.0000 [IU] | Freq: Three times a day (TID) | SUBCUTANEOUS | Status: DC
  Administered 2018-11-23 (×2): 3 [IU] via SUBCUTANEOUS
  Administered 2018-11-23: 5 [IU] via SUBCUTANEOUS
  Administered 2018-11-24: 6 [IU] via SUBCUTANEOUS

## 2018-11-22 MED ORDER — HYDRALAZINE HCL 25 MG PO TABS
25.0000 mg | ORAL_TABLET | Freq: Three times a day (TID) | ORAL | Status: DC
  Administered 2018-11-22 – 2018-11-24 (×7): 25 mg via ORAL
  Filled 2018-11-22 (×7): qty 1

## 2018-11-22 NOTE — Progress Notes (Signed)
Spoke with son Jude and updated on family. Family asked if they should take their mother d/t mother being exposed to father and how the family should proceed. Pt family informed that was not able to tell them what to do. Information provided about CDC hospital recommendations within CDC guidelines, that pt family may want to speak to a medical professional for concerns and if they had desires for testing.

## 2018-11-22 NOTE — Progress Notes (Signed)
STROKE TEAM PROGRESS NOTE  INTERVAL HISTORY  Patient doing extremely well neurologically.  He has been extubated and his exam is nonfocal except possible right NL flattening.  I spoke with him today and he denied any focal weakness, aphasia, he says that he came to [facility] because he started feeling very poorly and he had been exposed to his boss who confirmed COVID-19.  MRI of the brain was ordered and pending.    Vitals:   11/22/18 1000 11/22/18 1100 11/22/18 1200 11/22/18 1302  BP: (!) 160/86 (!) 146/80 136/69 (!) 150/92  Pulse: (!) 106 (!) 101 96   Resp: (!) 24 11 11    Temp:      TempSrc:      SpO2: 100% 99% 99%   Weight:      Height:        CBC:  Recent Labs  Lab 11/19/18 0320  11/21/18 0517 11/22/18 0256  WBC 15.9*   < > 15.1* 13.3*  NEUTROABS 13.8*  --   --   --   HGB 15.8   < > 13.3 12.9*  HCT 50.0   < > 41.9 41.5  MCV 92.1   < > 92.7 92.8  PLT 324   < > 198 222   < > = values in this interval not displayed.    Basic Metabolic Panel:  Recent Labs  Lab 11/20/18 0425 11/21/18 0517 11/22/18 0256  NA 140 142 144  K 4.2 3.5 3.7  CL 110 109 110  CO2 22 20* 24  GLUCOSE 196* 174* 156*  BUN 30* 24* 28*  CREATININE 1.61* 1.55* 1.55*  CALCIUM 8.5* 8.6* 8.9  MG 1.7 2.2 2.2  PHOS 1.6* 2.9  --    Lipid Panel:     Component Value Date/Time   CHOL 129 11/20/2018 0425   CHOL 137 09/19/2018 0743   TRIG 113 11/20/2018 0425   HDL 38 (L) 11/20/2018 0425   HDL 39 (L) 09/19/2018 0743   CHOLHDL 3.4 11/20/2018 0425   VLDL 23 11/20/2018 0425   LDLCALC 68 11/20/2018 0425   LDLCALC 67 09/19/2018 0743   HgbA1c:  Lab Results  Component Value Date   HGBA1C 6.8 (H) 11/20/2018   Urine Drug Screen:     Component Value Date/Time   LABOPIA NONE DETECTED 11/19/2018 0459   COCAINSCRNUR NONE DETECTED 11/19/2018 0459   LABBENZ NONE DETECTED 11/19/2018 0459   AMPHETMU NONE DETECTED 11/19/2018 0459   THCU NONE DETECTED 11/19/2018 0459   LABBARB NONE DETECTED  11/19/2018 0459    Alcohol Level     Component Value Date/Time   ETH <10 11/19/2018 0320    IMAGING  Dg Chest Port 1 View 11/22/2018 IMPRESSION:  No acute cardiopulmonary disease.    Dg Chest Port 1 View 11/21/2018 IMPRESSION:  1. Improved orogastric tube positioning with tip over the proximal stomach.  2. Stable low volume chest.    Dg Abd Portable 1v 11/20/2018 IMPRESSION:  Orogastric tube tip projects at the gastroesophageal junction. This will need to be further inserted, approximately 10 cm, to allow the tube to fully into the stomach.     PHYSICAL EXAM:  Exam: NAD, pleasant                  Speech:    Speech is normal; fluent and spontaneous with normal comprehension.  Cognition:    The patient is oriented to person, place, and time;     recent and remote memory intact;  language fluent;  Naming, repeat intact.   Cranial Nerves:    The pupils are equal, round, and reactive to light.VF full to confrontation. Trigeminal sensation is intact and the muscles of mastication are normal.  Right NL flattening. The palate elevates in the midline. Hearing intact. Voice is normal. Shoulder shrug is normal. The tongue has normal motion without fasciculations.   Coordination:  No dysmetria  Motor Observation:    No asymmetry, no atrophy, and no involuntary movements noted. Tone:    Normal muscle tone.     Strength:    Strength is V/V in the upper and lower limbs.      Sensation: intact to LT     ASSESSMENT/PLAN [name] is a 78 y.o. male with history of HTN, HLD , DM, ALCOHOL abuse presenting with altered mental status and R facial droop, R arm drift. Found to have fever with tachycardia and hypoxia. D/t rapid progression, LP performed.   Possible L brain Stroke  Code Stroke CT head No acute stroke. Old R BG lacune. ASPECTS 10.     CTA head no LVO. Mod B ICA stenosis. R VA, ACA, MCA and PCA atherosclerosis   CTA neck s/p L CEA w/ 60% stenosis. Mod  to severe B VA stenosis V1. Small pleural effusion and pulm edema  CT perfusion 4cc L occipital penumbra w/o core infarct  Initial CXR CHF  LP CSF RBC 27, WBC 2, Glu 141, TP 52  UDS neg  MRI  pending    LDL 67  HgbA1c 6.5  Lovenox 50 mg sq daily sq tid for VTE prophylaxis Diet Order            Diet heart healthy/carb modified Room service appropriate? Yes; Fluid consistency: Thin  Diet effective now              aspirin 81 mg daily prior to admission, now on aspirin 300 mg suppository daily or 325 mg per tube daily.   Therapy recommendations:  pending   Disposition:  pending   Acute on Chronic Respiratory Failure with hypoxemia Close exposure to COVID  Febrile  TM 99.6  WBC 15.1->13.3  Lactic acid 5.9  Intubated   Novel coronavirus negative. Coworker positive. Per ID, continue isolation given pt exposure.  Hypotension Hx Hypertension . If acute stroke, Permissive hypertension (OK if < 220/120) but gradually normalize in 5-7 days . Long-term BP goal normotensive  Hyperlipidemia  Home meds:  zocor 40  Not on statin in hospital  LDL 67, goal < 70 if stroke  Recommend Continuation of statin at discharge  Diabetes type II, controlled  HgbA1c 6.5, goal < 7.0  DB RN coordinator following. Consider adding levemir 10 bid.  Other Stroke Risk Factors  Advanced age  Hx ETOH abuse   Obesity, Body mass index is 34.12 kg/m., recommend weight loss, diet and exercise as appropriate   Coronary artery disease  Obstructive sleep apnea, on CPAP at home  Other Problems  AKI Cr 1.55  Hospital day # 3  Patient neurologically improved.  Patient denies any aphasia or focal weakness bring him to the hospital, states he was exposed to his boss who was positive for COVID-19 and started feeling poorly with altered mentation and fever and shortness of breath and came to the hospital.  Dr. Leonel Ramsay noted some possible focal deficits on exam such as right  lower facial weakness and CT perfusion showed a left occipital penumbra.  Pending MRI of the brain.  At  this time continue aspirin.  Personally examined patient and images, and have participated in and made any corrections needed to history, physical, neuro exam,assessment and plan as stated above.  I have personally obtained the history, evaluated lab date, reviewed imaging studies and agree with radiology interpretations.    Sarina Ill, MD Stroke Neurology   A total of 25 minutes was spent for the care of this patient, spent on counseling patient and family on different diagnostic and therapeutic options, counseling and coordination of care, riskd ans benefits of management, compliance, or risk factor reduction and education.   To contact Stroke Continuity provider, please refer to [email or web address] After hours, contact General Neurology

## 2018-11-22 NOTE — Progress Notes (Signed)
Name: Larry [name] MRN: [id number] DOB: March 24, 1941 Cc: AMS   LOS: 3  [place] [facility] Note  Presume +                                                    COVD testing negative Known COVID + Contact Continue AirBorne Precautions    History of Present Illness:  78 yo male with hx of HTN, HLD , DM, ALCOHOL abuse presenting with altered mental status and right facial droop. Last seen ok at 6 pm last night. He was a code stroke, CT's performed , no big stroke found and seen by Neurology. He then was seen febrile, still altered and LP was performed. Then noted to be hypoxic and requiring 4 L [place]. CXR with diffuse infiltrates. CCM consuLTED During our evaluation the patient is AAO3 but confused and acting strangely , he is protecting his airway and speaking in full sentences. Denies any cp , n/v/d. Unable to complete full ROS given pts mental statust.  Subjective:  Stable overnight. Titrated off O2 today. Patient states that he is feeling better.   Lines / Drains:PIV ETT 4/1>>>  Cultures:  Blood 4/1>>> Sputum 4/1>>> Urine 4/1>>>No Growth RVP 4/1>>> Covid 4/1>>>Not detected, But presumed positive due to Covid + contact CSF Culture 4/1>> Antibiotics:  Azithromycin 4/1>>> 5 days  Rocephin 4/1>>5 days  Plaquenil 4/1>>  Tests / Events: LP done in ED and code stroke ETT 4/1  Vital Signs: Temp:  [99.8 F (37.7 C)-101.2 F (38.4 C)] 99.8 F (37.7 C) (04/04 0400) Pulse Rate:  [55-106] 96 (04/04 1200) Resp:  [9-24] 11 (04/04 1200) BP: (119-178)/(52-96) 150/92 (04/04 1302) SpO2:  [94 %-100 %] 99 % (04/04 1200) Weight:  [114.1 kg] 114.1 kg (04/04 0500) I/O last 3 completed shifts: In: 2098.9 [P.O.:720; I.V.:882.6; NG/GT:200; IV Piggyback:296.3] Out: 1705 [Urine:1705]  Physical Examination: General: extubated, alert, doing well  Neuro:  Awake alert, following commands no deficit  HEENT: NCAT, sclera clear CV: RRR, s1 s2  PULM: BL post crackles, no rhonchi, no wheeze  GI:  soft, nt, nd  Extremities:  Trace BL LE edema  Skin: dry, intact    Labs    CBC Recent Labs  Lab 11/20/18 0425 11/21/18 0517 11/22/18 0256  HGB 14.1 13.3 12.9*  HCT 42.6 41.9 41.5  WBC 16.8* 15.1* 13.3*  PLT 208 198 222   BMET Recent Labs  Lab 11/19/18 0320 11/19/18 0402 11/19/18 0549 11/19/18 1139 11/20/18 0425 11/21/18 0517 11/22/18 0256  NA 137  --  137 141 140 142 144  K 4.6  --  4.2 4.1 4.2 3.5 3.7  CL 100  --   --   --  110 109 110  CO2 22  --   --   --  22 20* 24  GLUCOSE 325*  --   --   --  196* 174* 156*  BUN 21  --   --   --  30* 24* 28*  CREATININE 1.64* 1.40*  --   --  1.61* 1.55* 1.55*  CALCIUM 9.6  --   --   --  8.5* 8.6* 8.9  MG  --   --   --   --  1.7 2.2 2.2  PHOS  --   --   --   --  1.6* 2.9  --  Recent Labs  Lab 11/19/18 0320  INR 1.0   Recent Labs  Lab 11/19/18 0549 11/19/18 1139  PHART 7.327* 7.350  PCO2ART 36.1 43.5  PO2ART 73.0* 501.0*  HCO3 18.4* 23.4  TCO2 19* 25  O2SAT 90.0 100.0     Assessment and Plan: Principal Problem:   Acute respiratory failure with hypoxemia (HCC) Active Problems:   Acute encephalopathy   Close Exposure to Covid-19 Virus   Sepsis ([place])   Encephalopathy   Cerebral embolism with cerebral infarction    Initial testing is negative but remains a presumed + as patient had IFI with known COVID + contact Acute on Chronic Respiratory Failure Radiology: CXR 4/3>>  Direct positive contact with COVID+ coworker  Plan Weaned off o2 today  Continue plaquenil  Stable for transfer from the ICU   Toxic metabolic encephalopathy , suspect this is secondary to sepsis, resolved  Delirium - resolved  Small CVA on workup today , did not explain his R facial droop symptoms Neurology input appreciated Plan: Additional stroke work up per neurology   AKI, Elevated Scr  +CFB  Plan: Diuresis as needed to maintain euvolemai   HTN - start oral regimen today  - will add prn   HFrEF - continue diuresis to  maintain euvolemia   GI -  TF per nutrition Protonix as ordered   DM2 Plan CBGS with SSI   Best practices / Disposition: -->Code Status: FULL -->DVT Px: HSQ -->GI TG:[id number] -->Diet:TF  Remain on AIRBORNE PRECAUTIONS   Care discussed with TRH, Dr. Sherral Hammers that has agreed to take the patient on to his service for tomorrow. Stable for transfer to 2W.   Garner Nash, DO [facility] 11/22/2018 1:12 PM  Personal pager: [phone] If unanswered, please page CCM On-call: [phone]

## 2018-11-22 NOTE — Plan of Care (Signed)
  Problem: Education: Goal: Knowledge of General Education information will improve Description Including pain rating scale, medication(s)/side effects and non-pharmacologic comfort measures Outcome: Progressing   Problem: Health Behavior/Discharge Planning: Goal: Ability to manage health-related needs will improve Outcome: Progressing   Problem: Clinical Measurements: Goal: Ability to maintain clinical measurements within normal limits will improve Outcome: Progressing Goal: Will remain free from infection Outcome: Progressing Goal: Diagnostic test results will improve Outcome: Progressing Goal: Respiratory complications will improve Outcome: Progressing   Problem: Activity: Goal: Risk for activity intolerance will decrease Outcome: Progressing   Problem: Nutrition: Goal: Adequate nutrition will be maintained Outcome: Progressing   Problem: Elimination: Goal: Will not experience complications related to bowel motility Outcome: Progressing Goal: Will not experience complications related to urinary retention Outcome: Progressing   Problem: Safety: Goal: Ability to remain free from injury will improve Outcome: Progressing   Problem: Skin Integrity: Goal: Risk for impaired skin integrity will decrease Outcome: Progressing   Problem: Clinical Measurements: Goal: Cardiovascular complication will be avoided Outcome: Completed/Met   Problem: Coping: Goal: Level of anxiety will decrease Outcome: Completed/Met   Problem: Pain Managment: Goal: General experience of comfort will improve Outcome: Completed/Met

## 2018-11-22 NOTE — Plan of Care (Signed)
Patient continues to progress. Stable on 2L [place]. BP elevated this AM. Restarted home BP meds, which have caused pressures to trend down. HR stable at 100 bpm. QTC 427. Febrile with cooling blanket on. Family updated. Transfer orders in place for 2w.

## 2018-11-22 NOTE — Progress Notes (Signed)
SLP Cancellation Note  Patient Details Name: Larry [name] MRN: [id number] DOB: May 22, 1941   Cancelled treatment:       Reason Eval/Treat Not Completed: Other (comment) Speech-Language Pathology Contact Note:   Due to COVID-19 isolation restrictions, SLP conducted an indirect screening of swallowing function via discussion with RN  Findings: Pt is suspected to have CVA though MRI pending. Pt observed to have aphasia per RN. Pt passed [employer] screen on 11/21/18 1800, RN has been administering thin liquids and meds appropriately without adverse finding. Vocal quality is clear per her report. No clinical concern for dysphagia.   Recommendations:    Diet: Regular solids and Thin Liquids  Medications: Whole with liquids  Supervision: Intermittent supervision  Strategies: Sit upright  SLP services will sign off. Please do not hesitate to re-order our service if patient's swallowing function deteriorates.  Please order Cognitive linguistic eval when pt confirmed COVID negative and off contact precautions.    Herbie Baltimore, MA [place]  [facility] Pager [phone] Office [phone]  Lynann Beaver 11/22/2018, 10:58 AM

## 2018-11-23 DIAGNOSIS — R05 Cough: Secondary | ICD-10-CM

## 2018-11-23 DIAGNOSIS — Z20828 Contact with and (suspected) exposure to other viral communicable diseases: Secondary | ICD-10-CM

## 2018-11-23 LAB — CBC WITH DIFFERENTIAL/PLATELET
Abs Immature Granulocytes: 0.08 10*3/uL — ABNORMAL HIGH (ref 0.00–0.07)
Basophils Absolute: 0.1 10*3/uL (ref 0.0–0.1)
Basophils Relative: 1 %
Eosinophils Absolute: 0.2 10*3/uL (ref 0.0–0.5)
Eosinophils Relative: 1 %
HCT: 43.1 % (ref 39.0–52.0)
Hemoglobin: 13.5 g/dL (ref 13.0–17.0)
Immature Granulocytes: 1 %
Lymphocytes Relative: 19 %
Lymphs Abs: 2.7 10*3/uL (ref 0.7–4.0)
MCH: 29.3 pg (ref 26.0–34.0)
MCHC: 31.3 g/dL (ref 30.0–36.0)
MCV: 93.5 fL (ref 80.0–100.0)
Monocytes Absolute: 1.4 10*3/uL — ABNORMAL HIGH (ref 0.1–1.0)
Monocytes Relative: 10 %
Neutro Abs: 9.5 10*3/uL — ABNORMAL HIGH (ref 1.7–7.7)
Neutrophils Relative %: 68 %
Platelets: 232 10*3/uL (ref 150–400)
RBC: 4.61 MIL/uL (ref 4.22–5.81)
RDW: 12.6 % (ref 11.5–15.5)
WBC: 13.9 10*3/uL — ABNORMAL HIGH (ref 4.0–10.5)
nRBC: 0 % (ref 0.0–0.2)

## 2018-11-23 LAB — COMPREHENSIVE METABOLIC PANEL
ALT: 19 U/L (ref 0–44)
AST: 20 U/L (ref 15–41)
Albumin: 3.2 g/dL — ABNORMAL LOW (ref 3.5–5.0)
Alkaline Phosphatase: 65 U/L (ref 38–126)
Anion gap: 16 — ABNORMAL HIGH (ref 5–15)
BUN: 24 mg/dL — ABNORMAL HIGH (ref 8–23)
CO2: 20 mmol/L — ABNORMAL LOW (ref 22–32)
Calcium: 9.5 mg/dL (ref 8.9–10.3)
Chloride: 106 mmol/L (ref 98–111)
Creatinine, Ser: 1.5 mg/dL — ABNORMAL HIGH (ref 0.61–1.24)
GFR calc Af Amer: 51 mL/min — ABNORMAL LOW (ref >60)
GFR calc non Af Amer: 44 mL/min — ABNORMAL LOW (ref >60)
Glucose, Bld: 222 mg/dL — ABNORMAL HIGH (ref 70–99)
Potassium: 4.2 mmol/L (ref 3.5–5.1)
Sodium: 142 mmol/L (ref 135–145)
Total Bilirubin: 1 mg/dL (ref 0.3–1.2)
Total Protein: 6.5 g/dL (ref 6.5–8.1)

## 2018-11-23 LAB — GLUCOSE, CAPILLARY
Glucose-Capillary: 160 mg/dL — ABNORMAL HIGH (ref 70–99)
Glucose-Capillary: 237 mg/dL — ABNORMAL HIGH (ref 70–99)
Glucose-Capillary: 200 mg/dL — ABNORMAL HIGH (ref 70–99)
Glucose-Capillary: 188 mg/dL — ABNORMAL HIGH (ref 70–99)

## 2018-11-23 MED ORDER — CEFDINIR 300 MG PO CAPS
300.0000 mg | ORAL_CAPSULE | Freq: Two times a day (BID) | ORAL | Status: DC
  Administered 2018-11-24: 300 mg via ORAL
  Filled 2018-11-23 (×3): qty 1

## 2018-11-23 MED ORDER — ORAL CARE MOUTH RINSE: 15.0000 mL | Freq: Two times a day (BID) | OROMUCOSAL | Status: DC

## 2018-11-23 NOTE — Discharge Instructions (Addendum)
Person Under Monitoring Name: Larry [name]  Location: [place] [address]   CORONAVIRUS DISEASE 2019 (COVID-19) Guidance for Persons Under Investigation You are being tested for the virus that causes coronavirus disease 2019 (COVID-19). Public health actions are necessary to ensure protection of your health and the health of others, and to prevent further spread of infection. COVID-19 is caused by a virus that can cause symptoms, such as fever, cough, and shortness of breath. The primary transmission from person to person is by coughing or sneezing. On September 18, 2018, the [place] announced a [employer] Emergency of International Concern and on September 19, 2018 the [place]. Department of Health and Human Services declared a public health emergency. If the virus that causesCOVID-19 spreads in the community, it could have severe public health consequences.  As a person under investigation for COVID-19, the [facility] advises you to adhere to the following guidance until your test results are reported to you. If your test result is positive, you will receive additional information from your provider and your local health department at that time.   Remain at home until you are cleared by your health provider or public health authorities.   Keep a log of visitors to your home using the form provided. Any visitors to your home must be aware of your isolation status.  If you plan to move to a new address or leave the county, notify the local health department in your county.  Call a doctor or seek care if you have an urgent medical need. Before seeking medical care, call ahead and get instructions from the provider before arriving at the medical office, clinic or hospital. Notify them that you are being tested for the virus that causes COVID-19 so arrangements can be made, as necessary, to  prevent transmission to others in the healthcare setting. Next, notify the local health department in your county.  If a medical emergency arises and you need to call 911, inform the first responders that you are being tested for the virus that causes COVID-19. Next, notify the local health department in your county.  Adhere to all guidance set forth by the [facility] for [facility] of patients that is based on guidance from the Center for Disease Control and Prevention with suspected or confirmed COVID-19. It is provided with this guidance for Persons Under Investigation.  Your health and the health of our community are our top priorities. Public Health officials remain available to provide assistance and counseling to you about COVID-19 and compliance with this guidance.  Provider: ____________________________________________________________ Date: ______/_____/_________  By signing below, you acknowledge that you have read and agree to comply with this Guidance for Persons Under Investigation. ______________________________________________________________ Date: ______/_____/_________  WHO DO I CALL? You can find a list of local health departments here: [email or web address] Health Department: ____________________________________________________________________ Contact Name: ________________________________________________________________________ Telephone: ___________________________________________________________________________  Marice Potter, [place], Communicable Disease Branch COVID-19 Guidance for Persons Under Investigation October 25, 2018     Person Under Monitoring Name: Larry [name]  Location: [place] [address]   Infection Prevention Recommendations for Individuals Confirmed to have, or Being Evaluated for, 2019 Novel Coronavirus (COVID-19) Infection  Who Receive Care at Home  Individuals who are confirmed to have, or are being evaluated for, COVID-19 should follow the prevention steps below until a healthcare provider  or local or state health department says they can return to normal activities.  Stay home except to get medical care You should restrict activities outside your home, except for getting medical care. Do not go to work, school, or public areas, and do not use public transportation or taxis.  Call ahead before visiting your doctor Before your medical appointment, call the healthcare provider and tell them that you have, or are being evaluated for, COVID-19 infection. This will help the healthcare providers office take steps to keep other people from getting infected. Ask your healthcare provider to call the local or state health department.  Monitor your symptoms Seek prompt medical attention if your illness is worsening (e.g., difficulty breathing). Before going to your medical appointment, call the healthcare provider and tell them that you have, or are being evaluated for, COVID-19 infection. Ask your healthcare provider to call the local or state health department.  Wear a facemask You should wear a facemask that covers your nose and mouth when you are in the same room with other people and when you visit a healthcare provider. People who live with or visit you should also wear a facemask while they are in the same room with you.  Separate yourself from other people in your home As much as possible, you should stay in a different room from other people in your home. Also, you should use a separate bathroom, if available.  Avoid sharing household items You should not share dishes, drinking glasses, cups, eating utensils, towels, bedding, or other items with other people in your home. After using these items, you should wash them thoroughly with soap and water.  Cover your coughs and sneezes Cover your mouth and  nose with a tissue when you cough or sneeze, or you can cough or sneeze into your sleeve. Throw used tissues in a lined trash can, and immediately wash your hands with soap and water for at least 20 seconds or use an alcohol-based hand rub.  Wash your [facility] your hands often and thoroughly with soap and water for at least 20 seconds. You can use an alcohol-based hand sanitizer if soap and water are not available and if your hands are not visibly dirty. Avoid touching your eyes, nose, and mouth with unwashed hands.   Prevention Steps for Caregivers and Household Members of Individuals Confirmed to have, or Being Evaluated for, COVID-19 Infection Being Cared for in the Home  If you live with, or provide care at home for, a person confirmed to have, or being evaluated for, COVID-19 infection please follow these guidelines to prevent infection:  Follow healthcare providers instructions Make sure that you understand and can help the patient follow any healthcare provider instructions for all care.  Provide for the patients basic needs You should help the patient with basic needs in the home and provide support for getting groceries, prescriptions, and other personal needs.  Monitor the patients symptoms If they are getting sicker, call his or her medical provider and tell them that the patient has, or is being evaluated for, COVID-19 infection. This will help the healthcare providers office take steps to keep other people from getting infected. Ask the healthcare provider to call the local or state health department.  Limit the number of people who have contact with the patient  If possible, have only one caregiver for the patient.  Other household members should stay in another home or place of residence. If this is not possible, they should stay  in another room, or be separated from the patient as much as possible. Use a separate bathroom, if available.  Restrict visitors  who do not have an essential need to be in the home.  Keep older adults, very young children, and other sick people away from the patient Keep older adults, very young children, and those who have compromised immune systems or chronic health conditions away from the patient. This includes people with chronic heart, lung, or kidney conditions, diabetes, and cancer.  Ensure good ventilation Make sure that shared spaces in the home have good air flow, such as from an air conditioner or an opened window, weather permitting.  Wash your hands often  Wash your hands often and thoroughly with soap and water for at least 20 seconds. You can use an alcohol based hand sanitizer if soap and water are not available and if your hands are not visibly dirty.  Avoid touching your eyes, nose, and mouth with unwashed hands.  Use disposable paper towels to dry your hands. If not available, use dedicated cloth towels and replace them when they become wet.  Wear a facemask and gloves  Wear a disposable facemask at all times in the room and gloves when you touch or have contact with the patients blood, body fluids, and/or secretions or excretions, such as sweat, saliva, sputum, nasal mucus, vomit, urine, or feces.  Ensure the mask fits over your nose and mouth tightly, and do not touch it during use.  Throw out disposable facemasks and gloves after using them. Do not reuse.  Wash your hands immediately after removing your facemask and gloves.  If your personal clothing becomes contaminated, carefully remove clothing and launder. Wash your hands after handling contaminated clothing.  Place all used disposable facemasks, gloves, and other waste in a lined container before disposing them with other household waste.  Remove gloves and wash your hands immediately after handling these items.  Do not share dishes, glasses, or other household items with the patient  Avoid sharing household items. You should not  share dishes, drinking glasses, cups, eating utensils, towels, bedding, or other items with a patient who is confirmed to have, or being evaluated for, COVID-19 infection.  After the person uses these items, you should wash them thoroughly with soap and water.  Wash laundry thoroughly  Immediately remove and wash clothes or bedding that have blood, body fluids, and/or secretions or excretions, such as sweat, saliva, sputum, nasal mucus, vomit, urine, or feces, on them.  Wear gloves when handling laundry from the patient.  Read and follow directions on labels of laundry or clothing items and detergent. In general, wash and dry with the warmest temperatures recommended on the label.  Clean all areas the individual has used often  Clean all touchable surfaces, such as counters, tabletops, doorknobs, bathroom fixtures, toilets, phones, keyboards, tablets, and bedside tables, every day. Also, clean any surfaces that may have blood, body fluids, and/or secretions or excretions on them.  Wear gloves when cleaning surfaces the patient has come in contact with.  Use a diluted bleach solution (e.g., dilute bleach with 1 part bleach and 10 parts water) or a household disinfectant with a label that says EPA-registered for coronaviruses. To make a bleach solution at home, add 1 tablespoon of bleach to 1 quart (4 cups) of water. For a larger supply, add  cup of bleach to 1 gallon (16 cups) of water.  Read labels of cleaning products and follow recommendations provided on product labels.  Labels contain instructions for safe and effective use of the cleaning product including precautions you should take when applying the product, such as wearing gloves or eye protection and making sure you have good ventilation during use of the product.  Remove gloves and wash hands immediately after cleaning.  Monitor yourself for signs and symptoms of illness Caregivers and household members are considered close  contacts, should monitor their health, and will be asked to limit movement outside of the home to the extent possible. Follow the monitoring steps for close contacts listed on the symptom monitoring form.   ? If you have additional questions, contact your local health department or call the epidemiologist on call at [phone] (available 24/7). ? This guidance is subject to change. For the most up-to-date guidance from [facility], please refer to their website: [email or web address]

## 2018-11-23 NOTE — Plan of Care (Addendum)
Chart reviewed and discussed with Dr. Jaynee Eagles yesterday as well as Dr. Illene Silver today. Pt neuro intact without focal deficit. Pt neuro presentation likely to be encephalopathy vs. Seizure vs. Possible mild stroke. Given current concerning for COVID-19 disease and intact neuro exam, do not think repeat CT or perform MRI brain is warranted at this time. May also consider TTE and EEG as outpt. Currently on ASA 325mg . LDL 68 and A1C 6.8. Recommend to continue ASA on discharge. He will follow up with [facility] in 6 weeks. Other management as per primary team. Thank you for the consult.   Rosalin Hawking, MD PhD Stroke Neurology 11/23/2018 2:03 PM

## 2018-11-23 NOTE — Progress Notes (Signed)
[facility] PROGRESS NOTE  DUJUAN STANKOWSKI IRC:[id number] DOB: 1940-09-17 DOA: 11/19/2018 PCP: Cassandria Anger, MD  Assessment/Plan: Acute hypoxic respiratory failure and severe sepsis with end organ dysfunction secondary to COVID pneumonia. Sepsis physiology resolved.  Patient no longer requiring oxygen has normal respiratory status on room air.  Outpatient PCR for CO VID was negative but ID recommends treating as positive given highly suspicious clinical symptoms/syndrome as well as known sick contact.  He has responded well to therapy, tolerating Plaquenil therapy.  Continue to monitor QTC with EKG.  RVP and flu panel negative will continue coverage for community-acquired pneumonia with azithromycin and Rocephin.  Acute metabolic encephalopathy, resolved.  Likely all driven by sepsis from above infection.  LP negative for infection on admission alert and oriented x4 currently.  Does have alcohol history and required Precedex drip during ICU stay doing well currently.  Concern for possible left-sided CVA CTA with no acute stroke, CT perfusion to the left occipital to number without for infarct has been seen by neurology no acute focal deficits currently does have small right facial droop.  Neurology following pending MRI brain.  Continue aspirin  AKI on CKD, resolved Creatinine stable at baseline.  Monitor output.  Avoid nephrotoxins.  Type 2 diabetes.   Holding home oral hypoglycemics monitor CBGs, sliding scale as needed.  Alcohol abuse No signs of withdrawal.  Did not require Precedex while intubated.  Folic acid, multivitamin,Thiamine   HTN, improving BP 24 hours 143-176/77-87 continue carvedilol, hydralazine  Hyperlipidemia Holding home simvastatin  Code Status: Full code Family Communication: No family at bedside (indicate person spoken with, relationship, and if by phone, the number) Disposition Plan: Awaiting neurology recommendations, transition to oral antibiotics,  anticipate discharge in 24 hours if remains stable on room air with COVID precautions   Consultants:  PCCM, neurology, ID  Procedures:  Intubation 4/1, extubation 4/3  Antibiotics: Flagyl 3/31 Azithromycin 4/1- 4/5 Cefepime 3/31 Ceftriaxone 4/1-  HPI/Subjective:  Larry [name] is a 78 y.o. year old male with medical history significant for HTN, HLD, type 2 diabetes, alcohol abuse who presented on 11/19/2018 with strokelike symptoms with code stroke being called on arrival to ED.  Initially found to have fever in setting of altered status and underwent LP which was unremarkable.  Patient then developed tachycardia, tachypnea, lactic acidosis, and hypoxia requiring 4 L nasal cannula and chest x-ray with diffuse infiltrates.  Patient was admitted to ICU level of care due to acute hypoxic respiratory failure and sepsis secondary to CO VID pneumonia despite outpatient PCR negative given patient's significant clinical presentation and known contact with coworker with COVID per ID recommendations.   Doing well this morning Only slight cough Denies any shortness of breath No chest pain, no abdominal pain Normal BMs, normal appetite  Objective: Vitals:   11/23/18 0837 11/23/18 0911  BP:    Pulse:  (!) 113  Resp:    Temp: 98.6 F (37 C)   SpO2:      Intake/Output Summary (Last 24 hours) at 11/23/2018 1019 Last data filed at 11/23/2018 0600 Gross per 24 hour  Intake 600 ml  Output 750 ml  Net -150 ml   Filed Weights   11/22/18 0500 11/22/18 1348 11/23/18 0600  Weight: 114.1 kg 113.8 kg 115.4 kg    Exam:   General: Elderly male, lying in bed in no distress  Cardiovascular: Regular rate and rhythm, no peripheral edema  Respiratory: Normal respiratory effort on room air, diminished breath sounds  Abdomen:  Soft, nontender, nondistended  Musculoskeletal: Normal range of motion  Skin no rashes or ulcers  Neurologic slight right facial droop, otherwise no appreciable focal  deficits on exam  Data Reviewed: Basic Metabolic Panel: Recent Labs  Lab 11/19/18 0320 11/19/18 0402 11/19/18 0549 11/19/18 1139 11/20/18 0425 11/21/18 0517 11/22/18 0256  NA 137  --  137 141 140 142 144  K 4.6  --  4.2 4.1 4.2 3.5 3.7  CL 100  --   --   --  110 109 110  CO2 22  --   --   --  22 20* 24  GLUCOSE 325*  --   --   --  196* 174* 156*  BUN 21  --   --   --  30* 24* 28*  CREATININE 1.64* 1.40*  --   --  1.61* 1.55* 1.55*  CALCIUM 9.6  --   --   --  8.5* 8.6* 8.9  MG  --   --   --   --  1.7 2.2 2.2  PHOS  --   --   --   --  1.6* 2.9  --    Liver Function Tests: Recent Labs  Lab 11/19/18 0320  AST 26  ALT 23  ALKPHOS 79  BILITOT 1.1  PROT 7.3  ALBUMIN 3.9   No results for input(s): LIPASE, AMYLASE in the last 168 hours. No results for input(s): AMMONIA in the last 168 hours. CBC: Recent Labs  Lab 11/19/18 0320  11/19/18 1139 11/20/18 0425 11/21/18 0517 11/22/18 0256 11/23/18 0915  WBC 15.9*  --   --  16.8* 15.1* 13.3* 13.9*  NEUTROABS 13.8*  --   --   --   --   --  9.5*  HGB 15.8   < > 13.6 14.1 13.3 12.9* 13.5  HCT 50.0   < > 40.0 42.6 41.9 41.5 43.1  MCV 92.1  --   --  91.6 92.7 92.8 93.5  PLT 324  --   --  208 198 222 232   < > = values in this interval not displayed.   Cardiac Enzymes: Recent Labs  Lab 11/19/18 0320  CKTOTAL 58   BNP (last 3 results) No results for input(s): BNP in the last 8760 hours.  ProBNP (last 3 results) No results for input(s): PROBNP in the last 8760 hours.  CBG: Recent Labs  Lab 11/22/18 0900 11/22/18 1300 11/22/18 1647 11/22/18 2015 11/23/18 0828  GLUCAP 153* 149* 218* 177* 160*    Recent Results (from the past 240 hour(s))  Blood culture (routine x 2)     Status: None (Preliminary result)   Collection Time: 11/19/18  4:10 AM  Result Value Ref Range Status   Specimen Description BLOOD LEFT ARM  Final   Special Requests   Final    BOTTLES DRAWN AEROBIC ONLY Blood Culture results may not be optimal  due to an inadequate volume of blood received in culture bottles   Culture   Final    NO GROWTH 3 DAYS Performed at [facility], [place] [address], [place], [place] [address]    Report Status PENDING  Incomplete  Blood culture (routine x 2)     Status: None (Preliminary result)   Collection Time: 11/19/18  4:13 AM  Result Value Ref Range Status   Specimen Description BLOOD RIGHT ANTECUBITAL  Final   Special Requests   Final    BOTTLES DRAWN AEROBIC AND ANAEROBIC Blood Culture adequate volume  Culture   Final    NO GROWTH 3 DAYS Performed at [facility], [place] [address], [place], [place] [address]    Report Status PENDING  Incomplete  Novel Coronavirus, NAA (hospital order; send-out to ref lab)     Status: None   Collection Time: 11/19/18  4:23 AM  Result Value Ref Range Status   SARS-CoV-2, NAA NOT DETECTED NOT DETECTED Final    Comment: Negative (Not Detected) results do not exclude infection caused by SARS CoV 2 and should not be used as the sole basis for treatment or other patient management decisions. Optimum specimen types and timing for peak viral levels during infections caused  by SARS CoV 2 have not been determined. Collection of multiple specimens (types and time points) from the same patient may be necessary to detect the virus. Improper specimen collection and handling, sequence variability underlying assay primers and or probes, or the presence of organisms in  quantities less than the limit of detection of the assay may lead to false negative results. Positive and negative predictive values of testing are highly dependent on prevalence. False negative results are more likely when prevalence of disease is high. (NOTE) The expected result is Negative (Not Detected). The SARS CoV 2 test is intended for the presumptive qualitative  detection of nucleic acid from SARS CoV 2 in upper and lower  respir atory specimens. Testing methodology is real time RT PCR. Test  results must be correlated with clinical presentation and  evaluated in the context of other laboratory and epidemiologic data.  Test performance can be affected because the epidemiology and  clinical spectrum of infection caused by SARS CoV 2 is not fully  known. For example, the optimum types of specimens to collect and  when during the course of infection these specimens are most likely  to contain detectable viral RNA may not be known. This test has not been Food and Drug Administration (FDA) cleared or  approved and has been authorized by FDA under an Emergency Use  Authorization (EUA). The test is only authorized for the duration of  the declaration that circumstances exist justifying the authorization  of emergency use of in vitro diagnostic tests for detection and or  diagnosis of SARS CoV 2 under Section 564(b)(1) of the Act, 21 U.S.C.  section 670 724 8539 3(b)(1), unless the authorization is terminated or   revoked sooner. [facility] is certified under the  Clinical Laboratory Improvement Amendments of 1988 (CLIA), 42 U.S.C.  section 9541260899, to perform high complexity tests. Performed at [facility] [id number], [place], [place] [address] Laboratory Director: Loleta Books, MD    Coronavirus Source NASAL SWAB  Corrected    Comment: Performed at [facility], [place] [address], [place], [place] [address] CORRECTED ON 04/01 AT 6283: PREVIOUSLY REPORTED AS NASOPHARYNGEAL   CSF culture with Stat gram stain     Status: None   Collection Time: 11/19/18  4:48 AM  Result Value Ref Range Status   Specimen Description CSF  Final   Special Requests TUBE 2  Final   Gram Stain NO WBC SEEN NO ORGANISMS SEEN CYTOSPIN SMEAR   Final   Culture   Final    NO GROWTH 3 DAYS Performed at [facility], [place] [address], [place],  [address]    Report Status 11/22/2018 FINAL  Final  Urine culture     Status:  None  Collection Time: 11/19/18  4:50 AM  Result Value Ref Range Status   Specimen Description URINE, RANDOM  Final   Special Requests NONE  Final   Culture   Final    NO GROWTH Performed at [facility], [address] [place], [place] [address]    Report Status 11/20/2018 FINAL  Final  Respiratory Panel by PCR     Status: None   Collection Time: 11/21/18 12:40 PM  Result Value Ref Range Status   Adenovirus NOT DETECTED NOT DETECTED Final   Coronavirus 229E NOT DETECTED NOT DETECTED Final    Comment: (NOTE) The Coronavirus on the Respiratory Panel, DOES NOT test for the novel  Coronavirus (2019 nCoV)    Coronavirus HKU1 NOT DETECTED NOT DETECTED Final   Coronavirus NL63 NOT DETECTED NOT DETECTED Final   Coronavirus OC43 NOT DETECTED NOT DETECTED Final   Metapneumovirus NOT DETECTED NOT DETECTED Final   Rhinovirus / Enterovirus NOT DETECTED NOT DETECTED Final   Influenza A NOT DETECTED NOT DETECTED Final   Influenza B NOT DETECTED NOT DETECTED Final   Parainfluenza Virus 1 NOT DETECTED NOT DETECTED Final   Parainfluenza Virus 2 NOT DETECTED NOT DETECTED Final   Parainfluenza Virus 3 NOT DETECTED NOT DETECTED Final   Parainfluenza Virus 4 NOT DETECTED NOT DETECTED Final   Respiratory Syncytial Virus NOT DETECTED NOT DETECTED Final   Bordetella pertussis NOT DETECTED NOT DETECTED Final   Chlamydophila pneumoniae NOT DETECTED NOT DETECTED Final   Mycoplasma pneumoniae NOT DETECTED NOT DETECTED Final    Comment: Performed at [facility], [address] [place], [place] [address]     Studies: Dg Chest Port 1 View  Result Date: 11/22/2018 CLINICAL DATA:  Respiratory failurePt on airborne/contact isolation. EXAM: PORTABLE CHEST 1 VIEW COMPARISON:  11/21/2018 and older exams. FINDINGS: Interstitial thickening/edema noted on 11/19/2018 has resolved. There mild persistent prominent bronchovascular markings most evident in the lower lungs. No evidence of pneumonia. No  pleural effusion or pneumothorax. 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 IMPRESSION: No acute cardiopulmonary disease. Electronically Signed   By: Lajean Manes M.D.   On: 11/22/2018 05:52    Scheduled Meds: . aspirin  325 mg Oral Daily   Or  . aspirin  300 mg Rectal Daily  . carvedilol  12.5 mg Oral BID WC  . chlorhexidine gluconate (MEDLINE KIT)  15 mL Mouth Rinse BID  . enoxaparin (LOVENOX) injection  50 mg Subcutaneous Q24H  . feeding supplement (PRO-STAT SUGAR FREE 64)  60 mL Per Tube TID  . feeding supplement (VITAL HIGH PROTEIN)  1,000 mL Per Tube Q24H  . folic acid  1 mg Oral Daily  . hydrALAZINE  25 mg Oral Q8H  . hydroxychloroquine  200 mg Oral BID  . insulin aspart  0-15 Units Subcutaneous TID WC  . insulin aspart  0-5 Units Subcutaneous QHS  . mouth rinse  15 mL Mouth Rinse 10 times per day  . multivitamin with minerals  1 tablet Per Tube Daily  . thiamine  100 mg Oral Daily   Continuous Infusions:  Principal Problem:   Acute respiratory failure with hypoxemia (HCC) Active Problems:   Acute encephalopathy   Close Exposure to Covid-19 Virus   Sepsis (HCC)   Encephalopathy   Cerebral embolism with cerebral infarction      Desiree Hane  [facility]

## 2018-11-23 NOTE — Progress Notes (Signed)
Pt assisted with calling his wife

## 2018-11-24 ENCOUNTER — Telehealth: Payer: Self-pay | Admitting: Internal Medicine

## 2018-11-24 LAB — CULTURE, BLOOD (ROUTINE X 2)
Culture: NO GROWTH
Culture: NO GROWTH
Special Requests: ADEQUATE

## 2018-11-24 LAB — GLUCOSE, CAPILLARY
Glucose-Capillary: 211 mg/dL — ABNORMAL HIGH (ref 70–99)
Glucose-Capillary: 168 mg/dL — ABNORMAL HIGH (ref 70–99)

## 2018-11-24 MED ORDER — FOLIC ACID 1 MG PO TABS: 1.0000 mg | ORAL_TABLET | Freq: Every day | ORAL | 0 refills | Status: DC

## 2018-11-24 MED ORDER — HYDRALAZINE HCL 25 MG PO TABS: 25.0000 mg | ORAL_TABLET | Freq: Three times a day (TID) | ORAL | 0 refills | Status: DC

## 2018-11-24 MED ORDER — CEFDINIR 300 MG PO CAPS: 300.0000 mg | ORAL_CAPSULE | Freq: Two times a day (BID) | ORAL | 0 refills | Status: DC

## 2018-11-24 MED ORDER — HYDROXYCHLOROQUINE SULFATE 200 MG PO TABS: 200.0000 mg | ORAL_TABLET | Freq: Two times a day (BID) | ORAL | 0 refills | Status: AC

## 2018-11-24 MED ORDER — THIAMINE HCL 100 MG PO TABS: 100.0000 mg | ORAL_TABLET | Freq: Every day | ORAL | 0 refills | Status: DC

## 2018-11-24 MED ORDER — ASPIRIN 325 MG PO TABS: 325.0000 mg | ORAL_TABLET | Freq: Every day | ORAL | 0 refills | Status: DC

## 2018-11-24 MED ORDER — LOSARTAN POTASSIUM 100 MG PO TABS: 100.0000 mg | ORAL_TABLET | Freq: Every day | ORAL | 3 refills | Status: DC

## 2018-11-24 MED ORDER — CARVEDILOL 12.5 MG PO TABS: 12.5000 mg | ORAL_TABLET | Freq: Two times a day (BID) | ORAL | 0 refills | Status: DC

## 2018-11-24 NOTE — Telephone Encounter (Signed)
Copied from [place] [phone]. Topic: General - Inquiry >> Nov 24, 2018  4:41 PM Alanda Slim E wrote: Reason for CRM: Pt had a stroke last week and is being treated for corona virus and wife has questions/ the drs did not communicate any info and she wants Dr. Alain Marion to look at the Pt chart. Pt took a test and results for virus came back negative. Pt legs, arm, and feet are swollen and Pt wife wants to speak with Dr. Alain Marion and advise

## 2018-11-24 NOTE — Progress Notes (Signed)
SATURATION QUALIFICATIONS: (This note is used to comply with regulatory documentation for home oxygen)  Patient Saturations on Room Air at Rest = 100%  Patient Saturations on Room Air while Ambulating = 100%  Patient Saturations on  Liters of oxygen while Ambulating = %  Please briefly explain why patient needs home oxygen: patient up walking with no distress

## 2018-11-24 NOTE — Progress Notes (Signed)
Patient has been given discharge instruction with teach back I also talk to his wife and son 1 of 9 children. Patient waiting on wife to bring clothes to dress.

## 2018-11-24 NOTE — Discharge Summary (Addendum)
Discharge Summary  Larry [name] LOV:[id number] DOB: 1941-04-16  PCP: Cassandria Anger, MD  Admit date: 11/19/2018 Discharge date: 11/24/2018   Time spent: < 25 minutes  Admitted From: home Disposition:  home  Recommendations for Outpatient Follow-up:  1. FOLLOW COVID PRECAUTIONS, social distancing, contract signed on discharge 2. New medications: Plaquenil, hydralazine, carvedilol, aspirin 3. 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    Discharge Condition: STABLE   CODE STATUS:FULL CODE  Diet recommendation:    Vitals:   11/24/18 0547 11/24/18 0818  BP:  (!) 161/92  Pulse:  (!) 109  Resp:    Temp: 99.5 F (37.5 C)   SpO2:      History of present illness:  Larry [name] is a 78 y.o. year old male with medical history significant for HTN, HLD, type 2 diabetes, alcohol abuse who presented on 11/19/2018 with strokelike symptoms with code stroke being called on arrival to ED.  Initially found to have fever in setting of altered mental status and underwent LP which was unremarkable.  Patient then developed tachycardia, tachypnea, lactic acidosis, and hypoxia requiring 4 L nasal cannula and chest x-ray with diffuse infiltrates.  Patient was admitted to ICU level of care due to acute hypoxic respiratory failure and sepsis secondary to CO VID pneumonia despite outpatient PCR negative given patient's significant clinical presentation and known contact with coworker with COVID per ID recommendations. Remaining hospital course addressed in problem based format below:   Hospital Course:   Acute hypoxic  respiratory failure and severe sepsis with end organ dysfunction secondary to COVID pneumonia, resolved Initially on arrival was found to have 4 L nasal cannula before having to be intubated on day of admission due to worsening mental status and inability to protect airway, patient was extubated on 4/3 is able to wean back to room air. Outpatient PCR for CO VID was negative but ID recommended treating as positive given highly suspicious clinical symptoms/syndrome as well as known sick contact (co-worker).  RVP, flu panel was negative community-acquired pneumonia was also covered with IV ceftriaxone and azithromycin before transitioning to cefdinir prior to discharge patient was able to ambulate and maintain normal oxygenation.  He will continue with Plaquenil and cefdinir therapy at home.  Social contract and instructions were provided regarding self quarantining, appropriate social distancing on discharge  Acute metabolic encephalopathy, resolved.  Likely all driven by sepsis from above infection.  LP negative for infection on admission alert and oriented x4 currently.  Does have alcohol history and required Precedex drip during ICU  Concern for possible left-sided CVA CTA with no acute stroke, CT perfusion to the left occipital showed LEFT occipital penumbra currently does have small right facial droop. Given patient is being treated as CO VID positive neurology does not recommend MRI brain at this time as this is likely combination of encephalopathy from sepsis versus possible mild stroke.  He will continue aspirin 325 mg on discharge and has follow-up with neurology in 6 weeks arranged  AKI on CKD, resolved Creatinine stable at baseline.    Instructed to hold losartan until follow-up with PCP  Type 2  diabetes.   Held home oral hypoglycemics during hospital stay can reinitiate on discharge.  Alcohol abuse No signs of withdrawal during hospital stay.  Folic acid, multivitamin,Thiamine   HTN,  improving Started on carvedilol and hydralazine here.  Encouraged to discontinue metoprolol on discharge.  We will continue to hold on losartan given AKI on CKD and to follow with PCP.    Hyperlipidemia home simvastatin   Consultations:  Critical care, neurology, infectious disease  Procedures:  Intubation 4/1, extubation 4/3    Discharge Exam: BP (!) 161/92    Pulse (!) 109    Temp 99.5 F (37.5 C) (Oral)    Resp 18    Ht 6' (1.829 m)    Wt 115.4 kg    SpO2 98%    BMI 34.50 kg/m   General: Lying in bed, no apparent distress Eyes: EOMI, anicteric ENT: Oral Mucosa clear and moist Cardiovascular: regular rate and rhythm, no murmurs, rubs or gallops, no edema, Respiratory: Normal respiratory effort on room air, lungs clear to auscultation bilaterally Abdomen: soft, non-distended, non-tender, normal bowel sounds Skin: No Rash Neurologic: mild right facial droop, Grossly no focal neuro deficit.Mental status AAOx3, speech normal, Psychiatric:Appropriate affect, and mood   Discharge Instructions You were cared for by a hospitalist during your hospital stay. If you have any questions about your discharge medications or the care you received while you were in the hospital after you are discharged, you can call the unit and asked to speak with the hospitalist on call if the hospitalist that took care of you is not available. Once you are discharged, your primary care physician will handle any further medical issues. Please note that NO REFILLS for any discharge medications will be authorized once you are discharged, as it is imperative that you return to your primary care physician (or establish a relationship with a primary care physician if you do not have one) for your aftercare needs so that they can reassess your need for medications and monitor your lab values.  Discharge Instructions    Ambulatory referral to Neurology   Complete by:  As directed    Follow up with Dr. Leonie Man at  [facility] in 6 weeks. Too complicated for RN to follow. Thanks.   Diet - low sodium heart healthy   Complete by:  As directed    Increase activity slowly   Complete by:  As directed      Allergies as of 11/24/2018      Reactions   Amlodipine Swelling   Leg swelling   Verapamil Swelling   Edema w/high dose   Clindamycin Hcl    UNSPECIFIED REACTION    Contrast Media [iodinated Diagnostic Agents] Nausea And Vomiting      Medication List    STOP taking these medications   aspirin EC 81 MG tablet Replaced by:  aspirin 325 MG tablet   metoprolol succinate 25 MG 24 hr tablet Commonly known as:  TOPROL-XL     TAKE these medications   Accu-Chek FastClix Lancets Misc 1 each by Does not apply route 2 (two) times daily. Dx:E11.9   aspirin 325 MG tablet Take 1 tablet (325 mg total) by mouth daily. Replaces:  aspirin EC 81 MG tablet   carvedilol 12.5 MG tablet Commonly known as:  COREG Take 1 tablet (12.5 mg total) by mouth 2 (two) times daily with a meal.   cefdinir 300 MG capsule Commonly known as:  OMNICEF Take 1 capsule (300 mg total) by mouth every 12 (  twelve) hours.   folic acid 1 MG tablet Commonly known as:  FOLVITE Take 1 tablet (1 mg total) by mouth daily.   furosemide 20 MG tablet Commonly known as:  LASIX Take 1 tablet (20 mg total) by mouth daily. What changed:  how much to take   glimepiride 2 MG tablet Commonly known as:  AMARYL TAKE 1 TABLET BY MOUTH TWICE DAILY   glucose monitoring kit monitoring kit 1 each by Does not apply route as needed for other. Please dispense brand of choice per ins/patient. Dx: 250.00.   hydrALAZINE 25 MG tablet Commonly known as:  APRESOLINE Take 1 tablet (25 mg total) by mouth every 8 (eight) hours.   hydroxychloroquine 200 MG tablet Commonly known as:  PLAQUENIL Take 1 tablet (200 mg total) by mouth 2 (two) times daily for 2 doses.   losartan 100 MG tablet Commonly known as:  COZAAR Take 1 tablet (100 mg total) by mouth  daily. HOLD UNTIL SEEN BY YOUR PCP What changed:  additional instructions   Macular Vitamin Benefit Tabs Take 1 tablet by mouth 2 (two) times daily.   metFORMIN 1000 MG tablet Commonly known as:  GLUCOPHAGE Take 1,000 mg by mouth 2 (two) times daily with a meal.   nitroGLYCERIN 0.4 MG SL tablet Commonly known as:  NITROSTAT Place 1 tablet (0.4 mg total) under the tongue every 5 (five) minutes as needed for chest pain.   simvastatin 40 MG tablet Commonly known as:  ZOCOR Take 40 mg by mouth every evening.   thiamine 100 MG tablet Take 1 tablet (100 mg total) by mouth daily.   Vitamin D3 25 MCG (1000 UT) Caps Take 1,000 Units by mouth daily.      Allergies  Allergen Reactions   Amlodipine Swelling    Leg swelling   Verapamil Swelling    Edema w/high dose   Clindamycin Hcl     UNSPECIFIED REACTION    Contrast Media [Iodinated Diagnostic Agents] Nausea And Vomiting   Follow-up Information    [facility]. Schedule an appointment as soon as possible for a visit in 6 week(s).   Specialty:  Neurology Contact information: [address] [place] [phone]           The results of significant diagnostics from this hospitalization (including imaging, microbiology, ancillary and laboratory) are listed below for reference.    Significant Diagnostic Studies: Ct Angio Head W Or Wo Contrast  Result Date: 11/19/2018 CLINICAL DATA:  Aphasia and confusion. Follow up code stroke. History of LEFT carotid endarterectomy, hypertension and diabetes. EXAM: CT ANGIOGRAPHY HEAD AND NECK CT PERFUSION BRAIN TECHNIQUE: Multidetector CT imaging of the head and neck was performed using the standard protocol during bolus administration of intravenous contrast. Multiplanar CT image reconstructions and MIPs were obtained to evaluate the vascular anatomy. Carotid stenosis measurements (when applicable) are obtained utilizing NASCET  criteria, using the distal internal carotid diameter as the denominator. Multiphase CT imaging of the brain was performed following IV bolus contrast injection. Subsequent parametric perfusion maps were calculated using RAPID software. CONTRAST:  128m OMNIPAQUE IOHEXOL 350 MG/ML SOLN COMPARISON:  CT HEAD November 19, 2018 and CT angiogram neck August 01, 2017 FINDINGS: CTA NECK FINDINGS: AORTIC ARCH: Normal appearance of the thoracic arch, 2 vessel arch is a normal variant. Mild calcific atherosclerosis and intimal thickening. The origins of the innominate, left Common carotid artery and subclavian artery are patent. RIGHT CAROTID SYSTEM: Common carotid artery is patent. Moderate  calcific atherosclerosis of the carotid bifurcation without hemodynamically significant stenosis by NASCET criteria. Normal appearance of the internal carotid artery. LEFT CAROTID SYSTEM: Common carotid artery is patent, moderate intimal thickening and mild stenosis. Status post LEFT carotid endarterectomy. Within 1 cm of the origin is and 11 mm segment of 60% stenosis by NASCET criteria. VERTEBRAL ARTERIES:Codominant vertebral arteries. Moderate to severe bilateral V1 segment stenosis. SKELETON: No acute osseous process though bone windows have not been submitted. OTHER NECK: Soft tissues of the neck are nonacute though, not tailored for evaluation. UPPER CHEST: Small bilateral pleural effusions. Interlobular septal thickening, bronchial wall thickening and mosaic attenuation seen with pulmonary edema. Status post median sternotomy. CTA HEAD FINDINGS: ANTERIOR CIRCULATION: Patent cervical internal carotid arteries, petrous, cavernous and supra clinoid internal carotid arteries. Calcific atherosclerosis resulting in moderate stenosis bilateral carotid siphon. Patent anterior communicating artery. Patent anterior and middle cerebral arteries. Moderate stenosis RIGHT A2 segment, RIGHT M2 No large vessel occlusion, flow-limiting stenosis,  contrast extravasation or aneurysm. POSTERIOR CIRCULATION: Patent vertebral arteries, vertebrobasilar junction and basilar artery, as well as main branch vessels. Calcific atherosclerosis resulting in moderate stenosis moderate stenosis distal RIGHT V4 segment. Patent posterior cerebral arteries, moderate luminal irregularity compatible with atherosclerosis. No large vessel occlusion, flow-limiting stenosis, contrast extravasation or aneurysm. VENOUS SINUSES: Major dural venous sinuses are patent though not tailored for evaluation on this angiographic examination. ANATOMIC VARIANTS: Hypoplastic LEFT A1 segment. DELAYED PHASE: Not performed. MIP images reviewed. CT Brain Perfusion Findings: CBF (<30%) Volume: 6m Perfusion (Tmax>6.0s) volume: 477mMismatch Volume: 12m13mnfarction Location:LEFT occipital lobe. IMPRESSION: CTA NECK: 1. Status post LEFT carotid endarterectomy with 60% stenosis LEFT ICA by NASCET criteria. 2. Patent vertebral arteries. Moderate to severe stenosis bilateral V1 segments. 3. Small pleural effusions and findings of pulmonary edema. Recommend chest radiograph. CTA HEAD: 1. No emergent large vessel occlusion. 2. Moderate stenosis bilateral ICA. RIGHT vertebral artery, anterior, middle and posterior cerebral arteries multifocal moderate stenoses compatible with atherosclerosis. CT PERFUSION: 1. 4 cc LEFT occipital penumbra without core infarct. Critical Value/emergent results text paged to [facility] AM[facility] secure system on 11/19/2018 at 3:57 am, including interpreting physician's phone number. Electronically Signed   By: CouElon AlasD.   On: 11/19/2018 04:00   Ct Angio Neck W Or Wo Contrast  Result Date: 11/19/2018 CLINICAL DATA:  Aphasia and confusion. Follow up code stroke. History of LEFT carotid endarterectomy, hypertension and diabetes. EXAM: CT ANGIOGRAPHY HEAD AND NECK CT PERFUSION BRAIN TECHNIQUE: Multidetector CT imaging of the head and neck was performed using  the standard protocol during bolus administration of intravenous contrast. Multiplanar CT image reconstructions and MIPs were obtained to evaluate the vascular anatomy. Carotid stenosis measurements (when applicable) are obtained utilizing NASCET criteria, using the distal internal carotid diameter as the denominator. Multiphase CT imaging of the brain was performed following IV bolus contrast injection. Subsequent parametric perfusion maps were calculated using RAPID software. CONTRAST:  100m27mNIPAQUE IOHEXOL 350 MG/ML SOLN COMPARISON:  CT HEAD November 19, 2018 and CT angiogram neck August 01, 2017 FINDINGS: CTA NECK FINDINGS: AORTIC ARCH: Normal appearance of the thoracic arch, 2 vessel arch is a normal variant. Mild calcific atherosclerosis and intimal thickening. The origins of the innominate, left Common carotid artery and subclavian artery are patent. RIGHT CAROTID SYSTEM: Common carotid artery is patent. Moderate calcific atherosclerosis of the carotid bifurcation without hemodynamically significant stenosis by NASCET criteria. Normal appearance of the internal carotid artery. LEFT CAROTID SYSTEM: Common carotid artery is patent, moderate intimal thickening  and mild stenosis. Status post LEFT carotid endarterectomy. Within 1 cm of the origin is and 11 mm segment of 60% stenosis by NASCET criteria. VERTEBRAL ARTERIES:Codominant vertebral arteries. Moderate to severe bilateral V1 segment stenosis. SKELETON: No acute osseous process though bone windows have not been submitted. OTHER NECK: Soft tissues of the neck are nonacute though, not tailored for evaluation. UPPER CHEST: Small bilateral pleural effusions. Interlobular septal thickening, bronchial wall thickening and mosaic attenuation seen with pulmonary edema. Status post median sternotomy. CTA HEAD FINDINGS: ANTERIOR CIRCULATION: Patent cervical internal carotid arteries, petrous, cavernous and supra clinoid internal carotid arteries. Calcific  atherosclerosis resulting in moderate stenosis bilateral carotid siphon. Patent anterior communicating artery. Patent anterior and middle cerebral arteries. Moderate stenosis RIGHT A2 segment, RIGHT M2 No large vessel occlusion, flow-limiting stenosis, contrast extravasation or aneurysm. POSTERIOR CIRCULATION: Patent vertebral arteries, vertebrobasilar junction and basilar artery, as well as main branch vessels. Calcific atherosclerosis resulting in moderate stenosis moderate stenosis distal RIGHT V4 segment. Patent posterior cerebral arteries, moderate luminal irregularity compatible with atherosclerosis. No large vessel occlusion, flow-limiting stenosis, contrast extravasation or aneurysm. VENOUS SINUSES: Major dural venous sinuses are patent though not tailored for evaluation on this angiographic examination. ANATOMIC VARIANTS: Hypoplastic LEFT A1 segment. DELAYED PHASE: Not performed. MIP images reviewed. CT Brain Perfusion Findings: CBF (<30%) Volume: 75m Perfusion (Tmax>6.0s) volume: 476mMismatch Volume: 28m728mnfarction Location:LEFT occipital lobe. IMPRESSION: CTA NECK: 1. Status post LEFT carotid endarterectomy with 60% stenosis LEFT ICA by NASCET criteria. 2. Patent vertebral arteries. Moderate to severe stenosis bilateral V1 segments. 3. Small pleural effusions and findings of pulmonary edema. Recommend chest radiograph. CTA HEAD: 1. No emergent large vessel occlusion. 2. Moderate stenosis bilateral ICA. RIGHT vertebral artery, anterior, middle and posterior cerebral arteries multifocal moderate stenoses compatible with atherosclerosis. CT PERFUSION: 1. 4 cc LEFT occipital penumbra without core infarct. Critical Value/emergent results text paged to Dr[facility] AM[facility] secure system on 11/19/2018 at 3:57 am, including interpreting physician's phone number. Electronically Signed   By: CouElon AlasD.   On: 11/19/2018 04:00   Ct Cerebral Perfusion W Contrast  Result Date:  11/19/2018 CLINICAL DATA:  Aphasia and confusion. Follow up code stroke. History of LEFT carotid endarterectomy, hypertension and diabetes. EXAM: CT ANGIOGRAPHY HEAD AND NECK CT PERFUSION BRAIN TECHNIQUE: Multidetector CT imaging of the head and neck was performed using the standard protocol during bolus administration of intravenous contrast. Multiplanar CT image reconstructions and MIPs were obtained to evaluate the vascular anatomy. Carotid stenosis measurements (when applicable) are obtained utilizing NASCET criteria, using the distal internal carotid diameter as the denominator. Multiphase CT imaging of the brain was performed following IV bolus contrast injection. Subsequent parametric perfusion maps were calculated using RAPID software. CONTRAST:  100m30mNIPAQUE IOHEXOL 350 MG/ML SOLN COMPARISON:  CT HEAD November 19, 2018 and CT angiogram neck August 01, 2017 FINDINGS: CTA NECK FINDINGS: AORTIC ARCH: Normal appearance of the thoracic arch, 2 vessel arch is a normal variant. Mild calcific atherosclerosis and intimal thickening. The origins of the innominate, left Common carotid artery and subclavian artery are patent. RIGHT CAROTID SYSTEM: Common carotid artery is patent. Moderate calcific atherosclerosis of the carotid bifurcation without hemodynamically significant stenosis by NASCET criteria. Normal appearance of the internal carotid artery. LEFT CAROTID SYSTEM: Common carotid artery is patent, moderate intimal thickening and mild stenosis. Status post LEFT carotid endarterectomy. Within 1 cm of the origin is and 11 mm segment of 60% stenosis by NASCET criteria. VERTEBRAL ARTERIES:Codominant vertebral arteries. Moderate to severe bilateral  V1 segment stenosis. SKELETON: No acute osseous process though bone windows have not been submitted. OTHER NECK: Soft tissues of the neck are nonacute though, not tailored for evaluation. UPPER CHEST: Small bilateral pleural effusions. Interlobular septal thickening,  bronchial wall thickening and mosaic attenuation seen with pulmonary edema. Status post median sternotomy. CTA HEAD FINDINGS: ANTERIOR CIRCULATION: Patent cervical internal carotid arteries, petrous, cavernous and supra clinoid internal carotid arteries. Calcific atherosclerosis resulting in moderate stenosis bilateral carotid siphon. Patent anterior communicating artery. Patent anterior and middle cerebral arteries. Moderate stenosis RIGHT A2 segment, RIGHT M2 No large vessel occlusion, flow-limiting stenosis, contrast extravasation or aneurysm. POSTERIOR CIRCULATION: Patent vertebral arteries, vertebrobasilar junction and basilar artery, as well as main branch vessels. Calcific atherosclerosis resulting in moderate stenosis moderate stenosis distal RIGHT V4 segment. Patent posterior cerebral arteries, moderate luminal irregularity compatible with atherosclerosis. No large vessel occlusion, flow-limiting stenosis, contrast extravasation or aneurysm. VENOUS SINUSES: Major dural venous sinuses are patent though not tailored for evaluation on this angiographic examination. ANATOMIC VARIANTS: Hypoplastic LEFT A1 segment. DELAYED PHASE: Not performed. MIP images reviewed. CT Brain Perfusion Findings: CBF (<30%) Volume: 79m Perfusion (Tmax>6.0s) volume: 465mMismatch Volume: 20m13mnfarction Location:LEFT occipital lobe. IMPRESSION: CTA NECK: 1. Status post LEFT carotid endarterectomy with 60% stenosis LEFT ICA by NASCET criteria. 2. Patent vertebral arteries. Moderate to severe stenosis bilateral V1 segments. 3. Small pleural effusions and findings of pulmonary edema. Recommend chest radiograph. CTA HEAD: 1. No emergent large vessel occlusion. 2. Moderate stenosis bilateral ICA. RIGHT vertebral artery, anterior, middle and posterior cerebral arteries multifocal moderate stenoses compatible with atherosclerosis. CT PERFUSION: 1. 4 cc LEFT occipital penumbra without core infarct. Critical Value/emergent results text paged to  Dr.MCNE[facility] AMION secure system on 11/19/2018 at 3:57 am, including interpreting physician's phone number. Electronically Signed   By: CouElon AlasD.   On: 11/19/2018 04:00   Dg Chest Port 1 View  Result Date: 11/22/2018 CLINICAL DATA:  Respiratory failurePt on airborne/contact isolation. EXAM: PORTABLE CHEST 1 VIEW COMPARISON:  11/21/2018 and older exams. FINDINGS: Interstitial thickening/edema noted on 11/19/2018 has resolved. There mild persistent prominent bronchovascular markings most evident in the lower lungs. No evidence of pneumonia. No pleural effusion or pneumothorax. 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 IMPRESSION: No acute cardiopulmonary disease. Electronically Signed   By: DavLajean ManesD.   On: 11/22/2018 05:52   Dg Chest Port 1 View  Result Date: 11/21/2018 CLINICAL DATA:  Intubation EXAM: PORTABLE CHEST 1 VIEW COMPARISON:  Yesterday FINDINGS: Interval improvement in orogastric tube positioning with tip now overlapping the stomach. Endotracheal tube tip between the clavicular heads and carina. Cardiomegaly. CABG. Low volume chest with hazy opacity at the bases, stable. There may be layering pleural fluid. No pneumothorax. IMPRESSION: 1. Improved orogastric tube positioning with tip over the proximal stomach. 2. Stable low volume chest. Electronically Signed   By: JonMonte FantasiaD.   On: 11/21/2018 06:10   Dg Chest Port 1 View  Result Date: 11/20/2018 CLINICAL DATA:  Hypoxia.  History of endotracheal tube EXAM: PORTABLE CHEST 1 VIEW COMPARISON:  Yesterday FINDINGS: Endotracheal tube tip just below the clavicular heads. The orogastric tube is difficult to visualize separate from overlapping EKG leads. The side port appears to overlap the thoracic inlet, need correlation with catheter length. Low volume chest with hazy opacity at the bases. There may be pleural fluid. Stable heart size. CABG. No pneumothorax. These results will be  called to the ordering clinician or representative by  the Radiologist Assistant, and communication documented in the PACS or zVision Dashboard. IMPRESSION: 1. Uncertain positioning of the orogastric tube, the side port appears to project over the thoracic inlet-please correlate with catheter length. 2. Stable low volume chest. Electronically Signed   By: Monte Fantasia M.D.   On: 11/20/2018 07:01   Portable Chest X-ray  Result Date: 11/19/2018 CLINICAL DATA:  Endotracheal tube placement EXAM: PORTABLE CHEST 1 VIEW COMPARISON:  11/19/2018 FINDINGS: Endotracheal tube with the tip 5 cm above above the carina. Nasogastric tube coursing below the diaphragm. Bilateral mild interstitial thickening similar in appearance to the prior exam. No pleural effusion or pneumothorax. Stable cardiomediastinal silhouette. Prior CABG. IMPRESSION: 1. Endotracheal tube with the tip 5 cm above the carina. 2. Persistent mild CHF. Electronically Signed   By: Kathreen Devoid   On: 11/19/2018 11:28   Dg Chest Portable 1 View  Result Date: 11/19/2018 CLINICAL DATA:  Hypoxia.  Facial droop EXAM: PORTABLE CHEST 1 VIEW COMPARISON:  08/25/2017 FINDINGS: Diffuse interstitial coarsening with Kerley lines. Cardiomegaly. Prior CABG. There is pleural fluid by preceding neck CTA. IMPRESSION: CHF pattern. Electronically Signed   By: Monte Fantasia M.D.   On: 11/19/2018 04:14   Dg Abd Portable 1v  Result Date: 11/20/2018 CLINICAL DATA:  Orogastric tube placement. EXAM: PORTABLE ABDOMEN - 1 VIEW COMPARISON:  11/20/2018 at 4:22 a.m. FINDINGS: Orogastric tube tip now projects at the gastroesophageal junction. It does not appear to enter the stomach. IMPRESSION: Orogastric tube tip projects at the gastroesophageal junction. This will need to be further inserted, approximately 10 cm, to allow the tube to fully into the stomach. Electronically Signed   By: Lajean Manes M.D.   On: 11/20/2018 09:24   Dg Abd Portable 1v  Result Date:  11/20/2018 CLINICAL DATA:  Impaired orogastric feeding tube EXAM: PORTABLE ABDOMEN - 1 VIEW COMPARISON:  Yesterday FINDINGS: The orogastric tube tip is loops through the lower esophagus with tip at the thoracic inlet based on prior chest x-ray. Relatively gasless abdomen without obstructive process or concerning mass effect. These results will be called to the ordering clinician or representative by the Radiologist Assistant, and communication documented in the PACS or zVision Dashboard. IMPRESSION: Malpositioned orogastric tube which loops through the lower esophagus with tip in the neck region. Electronically Signed   By: Monte Fantasia M.D.   On: 11/20/2018 07:06   Dg Abd Portable 1v  Result Date: 11/20/2018 CLINICAL DATA:  OG tube placement attempt #4 EXAM: PORTABLE ABDOMEN - 1 VIEW COMPARISON:  11/19/2018 at 2159 hours FINDINGS: Enteric tube looped in the distal esophagus with its tip not visualized above the upper edge of the image. IMPRESSION: Enteric tube looped in the distal esophagus with its tip not visualized above the upper edge of the image. Electronically Signed   By: Julian Hy M.D.   On: 11/20/2018 01:10   Dg Abd Portable 1v  Result Date: 11/20/2018 CLINICAL DATA:  OG tube attempt #3 EXAM: PORTABLE ABDOMEN - 1 VIEW COMPARISON:  11/19/2018 at 2159 hours FINDINGS: Enteric tube terminates in the distal esophagus. IMPRESSION: Enteric tube terminates in the distal esophagus. Electronically Signed   By: Julian Hy M.D.   On: 11/20/2018 01:09   Dg Abd Portable 1v  Result Date: 11/20/2018 CLINICAL DATA:  NG tube placement #2 EXAM: PORTABLE ABDOMEN - 1 VIEW COMPARISON:  11/19/2018 2146 hours FINDINGS: Enteric tube terminates at the GE junction. IMPRESSION: Enteric tube terminates at the GE junction. Electronically Signed   By: Bertis Ruddy  [place] Pink M.D.   On: 11/20/2018 01:08   Dg Abd Portable 1v  Result Date: 11/20/2018 CLINICAL DATA:  Nasogastric tube placement. EXAM: PORTABLE ABDOMEN  - 1 VIEW COMPARISON:  None. FINDINGS: Nasogastric tube tip projects at GE junction. Contrast in the urinary collecting system, no hydronephrosis. No intra-abdominal mass effect. Bowel gas pattern is nondilated and nonobstructive. Soft tissue planes and included osseous structures are non suspicious. IMPRESSION: Nasogastric tube tip projects at GE junction.  Sing spine Electronically Signed   By: Elon Alas M.D.   On: 11/20/2018 00:22   Ct Head Code Stroke Wo Contrast  Addendum Date: 11/19/2018   ADDENDUM REPORT: 11/19/2018 03:45 ADDENDUM: Critical Value/emergent results text paged to [place], Neurology via AMION secure system on 11/19/2018 at 3:35 am, including interpreting physician's phone number. Electronically Signed   By: Elon Alas M.D.   On: 11/19/2018 03:45   Result Date: 11/19/2018 CLINICAL DATA:  Code stroke. Aphasia and confusion. History of LEFT carotid endarterectomy, hypertension and diabetes. EXAM: CT HEAD WITHOUT CONTRAST TECHNIQUE: Contiguous axial images were obtained from the base of the skull through the vertex without intravenous contrast. COMPARISON:  None. FINDINGS: BRAIN: No intraparenchymal hemorrhage, mass effect nor midline shift. No parenchymal brain volume loss for age. No hydrocephalus. Patchy supratentorial white matter hypodensities less than expected for patient's age, though non-specific are most compatible with chronic small vessel ischemic disease. Cystic RIGHT basal ganglia lacunar infarcts. No acute large vascular territory infarcts. No abnormal extra-axial fluid collections. Basal cisterns are patent. VASCULAR: Mild-to-moderate calcific atherosclerosis of the carotid siphons. SKULL: No skull fracture. No significant scalp soft tissue swelling. SINUSES/ORBITS: Trace paranasal sinus mucosal thickening. Mastoid air cells are well aerated.The included ocular globes and orbital contents are non-suspicious. Status post bilateral ocular lens implants. OTHER:  None. ASPECTS [facility] Stroke Program Early CT Score) - Ganglionic level infarction (caudate, lentiform nuclei, internal capsule, insula, M1-M3 cortex): 7 - Supraganglionic infarction (M4-M6 cortex): 3 Total score (0-10 with 10 being normal): 10 IMPRESSION: 1. No acute intracranial process. 2. ASPECTS is 10. 3. 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 Electronically Signed: By: Elon Alas M.D. On: 11/19/2018 03:36    Microbiology: Recent Results (from the past 240 hour(s))  Blood culture (routine x 2)     Status: None   Collection Time: 11/19/18  4:10 AM  Result Value Ref Range Status   Specimen Description BLOOD LEFT ARM  Final   Special Requests   Final    BOTTLES DRAWN AEROBIC ONLY Blood Culture results may not be optimal due to an inadequate volume of blood received in culture bottles   Culture   Final    NO GROWTH 5 DAYS Performed at [facility], [place] [address], [place], [place] [address]    Report Status 11/24/2018 FINAL  Final  Blood culture (routine x 2)     Status: None   Collection Time: 11/19/18  4:13 AM  Result Value Ref Range Status   Specimen Description BLOOD RIGHT ANTECUBITAL  Final   Special Requests   Final    BOTTLES DRAWN AEROBIC AND ANAEROBIC Blood Culture adequate volume   Culture   Final    NO GROWTH 5 DAYS Performed at [facility], [place] [address] [place], [place] [address]    Report Status 11/24/2018 FINAL  Final  Novel Coronavirus, NAA (hospital order; send-out to ref lab)     Status: None   Collection Time: 11/19/18  4:23 AM  Result Value Ref Range Status   SARS-CoV-2, NAA NOT DETECTED NOT DETECTED Final    Comment: Negative (Not Detected) results do not exclude infection caused by SARS CoV 2 and should not be used as the sole basis for treatment or other patient management decisions. Optimum specimen types and timing for peak viral levels during infections caused  by SARS CoV 2 have not  been determined. Collection of multiple specimens (types and time points) from the same patient may be necessary to detect the virus. Improper specimen collection and handling, sequence variability underlying assay primers and or probes, or the presence of organisms in  quantities less than the limit of detection of the assay may lead to false negative results. Positive and negative predictive values of testing are highly dependent on prevalence. False negative results are more likely when prevalence of disease is high. (NOTE) The expected result is Negative (Not Detected). The SARS CoV 2 test is intended for the presumptive qualitative  detection of nucleic acid from SARS CoV 2 in upper and lower  respir atory specimens. Testing methodology is real time RT PCR. Test results must be correlated with clinical presentation and  evaluated in the context of other laboratory and epidemiologic data.  Test performance can be affected because the epidemiology and  clinical spectrum of infection caused by SARS CoV 2 is not fully  known. For example, the optimum types of specimens to collect and  when during the course of infection these specimens are most likely  to contain detectable viral RNA may not be known. This test has not been Food and Drug Administration (FDA) cleared or  approved and has been authorized by FDA under an Emergency Use  Authorization (EUA). The test is only authorized for the duration of  the declaration that circumstances exist justifying the authorization  of emergency use of in vitro diagnostic tests for detection and or  diagnosis of SARS CoV 2 under Section 564(b)(1) of the Act, 21 U.S.C.  section [phone] 3(b)(1), unless the authorization is terminated or   revoked sooner. [facility] is certified under the  Clinical Laboratory Improvement Amendments of 1988 (CLIA), 42 U.S.C.  section [phone], to perform high complexity tests. Performed at [facility] [id number] [address], [place], [place] [address] Laboratory Director: Loleta Books, MD    Coronavirus Source NASAL SWAB  Corrected    Comment: Performed at [facility], [place] [address] [place], [place] [address] CORRECTED ON 04/01 AT 0321: PREVIOUSLY REPORTED AS NASOPHARYNGEAL   CSF culture with Stat gram stain     Status: None   Collection Time: 11/19/18  4:48 AM  Result Value Ref Range Status   Specimen Description CSF  Final   Special Requests TUBE 2  Final   Gram Stain NO WBC SEEN NO ORGANISMS SEEN CYTOSPIN SMEAR   Final   Culture   Final    NO GROWTH 3 DAYS Performed at [facility], [place] [address] [place], [place] [address]    Report Status 11/22/2018 FINAL  Final  Urine culture     Status: None   Collection Time: 11/19/18  4:50 AM  Result Value Ref Range Status   Specimen Description URINE, RANDOM  Final   Special Requests NONE  Final   Culture   Final    NO GROWTH Performed at [facility], [place] [address] [place],  [address]    Report Status  11/20/2018 FINAL  Final  Respiratory Panel by PCR     Status: None   Collection Time: 11/21/18 12:40 PM  Result Value Ref Range Status   Adenovirus NOT DETECTED NOT DETECTED Final   Coronavirus 229E NOT DETECTED NOT DETECTED Final    Comment: (NOTE) The Coronavirus on the Respiratory Panel, DOES NOT test for the novel  Coronavirus (2019 nCoV)    Coronavirus HKU1 NOT DETECTED NOT DETECTED Final   Coronavirus NL63 NOT DETECTED NOT DETECTED Final   Coronavirus OC43 NOT DETECTED NOT DETECTED Final   Metapneumovirus NOT DETECTED NOT DETECTED Final   Rhinovirus / Enterovirus NOT DETECTED NOT DETECTED Final   Influenza A NOT DETECTED NOT DETECTED Final   Influenza B NOT DETECTED NOT DETECTED Final   Parainfluenza Virus 1 NOT DETECTED NOT DETECTED Final   Parainfluenza Virus 2 NOT DETECTED NOT DETECTED Final   Parainfluenza Virus 3 NOT DETECTED NOT  DETECTED Final   Parainfluenza Virus 4 NOT DETECTED NOT DETECTED Final   Respiratory Syncytial Virus NOT DETECTED NOT DETECTED Final   Bordetella pertussis NOT DETECTED NOT DETECTED Final   Chlamydophila pneumoniae NOT DETECTED NOT DETECTED Final   Mycoplasma pneumoniae NOT DETECTED NOT DETECTED Final    Comment: Performed at [facility], [address], [place], [place] [address]     Labs: Basic Metabolic Panel: Recent Labs  Lab 11/19/18 0320 11/19/18 0402  11/19/18 1139 11/20/18 0425 11/21/18 0517 11/22/18 0256 11/23/18 0915  NA 137  --    < > 141 140 142 144 142  K 4.6  --    < > 4.1 4.2 3.5 3.7 4.2  CL 100  --   --   --  110 109 110 106  CO2 22  --   --   --  22 20* 24 20*  GLUCOSE 325*  --   --   --  196* 174* 156* 222*  BUN 21  --   --   --  30* 24* 28* 24*  CREATININE 1.64* 1.40*  --   --  1.61* 1.55* 1.55* 1.50*  CALCIUM 9.6  --   --   --  8.5* 8.6* 8.9 9.5  MG  --   --   --   --  1.7 2.2 2.2  --   PHOS  --   --   --   --  1.6* 2.9  --   --    < > = values in this interval not displayed.   Liver Function Tests: Recent Labs  Lab 11/19/18 0320 11/23/18 0915  AST 26 20  ALT 23 19  ALKPHOS 79 65  BILITOT 1.1 1.0  PROT 7.3 6.5  ALBUMIN 3.9 3.2*   No results for input(s): LIPASE, AMYLASE in the last 168 hours. No results for input(s): AMMONIA in the last 168 hours. CBC: Recent Labs  Lab 11/19/18 0320  11/19/18 1139 11/20/18 0425 11/21/18 0517 11/22/18 0256 11/23/18 0915  WBC 15.9*  --   --  16.8* 15.1* 13.3* 13.9*  NEUTROABS 13.8*  --   --   --   --   --  9.5*  HGB 15.8   < > 13.6 14.1 13.3 12.9* 13.5  HCT 50.0   < > 40.0 42.6 41.9 41.5 43.1  MCV 92.1  --   --  91.6 92.7 92.8 93.5  PLT 324  --   --  208 198 222 232   < > = values in this interval not displayed.  Cardiac Enzymes: Recent Labs  Lab 11/19/18 0320  CKTOTAL 58   BNP: BNP (last 3 results) No results for input(s): BNP in the last 8760 hours.  ProBNP (last 3 results) No  results for input(s): PROBNP in the last 8760 hours.  CBG: Recent Labs  Lab 11/23/18 0828 11/23/18 1116 11/23/18 1644 11/23/18 2043 11/24/18 0812  GLUCAP 160* 237* 200* 188* 211*       Signed:  Desiree Hane, MD [facility] 11/24/2018, 9:29 AM

## 2018-11-24 NOTE — Care Management Important Message (Signed)
Important Message  Patient Details  Name: Larry [name] MRN: [id number] Date of Birth: 15-Dec-1940   Medicare Important Message Given:  Yes    Zenon Mayo, RN 11/24/2018, 10:24 AM

## 2018-11-25 ENCOUNTER — Telehealth (INDEPENDENT_AMBULATORY_CARE_PROVIDER_SITE_OTHER): Payer: Medicare Other | Admitting: *Deleted

## 2018-11-25 DIAGNOSIS — Z20828 Contact with and (suspected) exposure to other viral communicable diseases: Secondary | ICD-10-CM | POA: Diagnosis not present

## 2018-11-25 DIAGNOSIS — Z8701 Personal history of pneumonia (recurrent): Secondary | ICD-10-CM

## 2018-11-25 NOTE — Telephone Encounter (Signed)
Transition Care Management Follow-up Telephone Call   Date discharged? 11/24/18   How have you been since you were released from the hospital? Spoke w/wife she states he is doing ok now since he is home. Wife states he was in the hospital for 5 days and had a horrible time.    Do you understand why you were in the hospital? YES   Do you understand the discharge instructions? YES, but wife states they prescribe the experiment drug for him " Hydroxychloroquine 200 mg"  She states the medication was m,aking him very delusional. He was not him self. He thought he had killed people and was determine to get out of the hospital. Wife states she did not give him the dose for today, and not sure he need to taske since his Covid 41 test was negative.    Where were you discharged to? Home   Items Reviewed:  Medications reviewed: YES, pt has not been taking Glimepiride. Wife states that was one concern she wanted to talk w/Dr. Alain Marion due to his BS being elevated in the hospital. She states she check BS this am it was 208  Allergies reviewed: YES  Dietary changes reviewed: YES  Referrals reviewed: YES. Waiting on neurology to call for appt   Functional Questionnaire:   Activities of Daily Living (ADLs):   she states he are independent in the following: ambulation, bathing and hygiene, feeding, continence, grooming, toileting and dressing States he doesn't require assistance    Any transportation issues/concerns?: NO   Any patient concerns? YES, wanting to let MD know about the Hydroxychloroquine, and how it had husband acting   Confirmed importance and date/time of follow-up visits scheduled NO, pt/wife not able to do an virtual visit, and requesting MD to give them a call    Confirmed with patient if condition begins to worsen call PCP or go to the ER.  Patient was given the office number and encouraged to call back with question or concerns.  : YES

## 2018-11-25 NOTE — Telephone Encounter (Signed)
Please advise 

## 2018-11-25 NOTE — Telephone Encounter (Signed)
Patient's wife says he is taking glimepiride 2mg and she accidentally said he was'nt.

## 2018-11-25 NOTE — Telephone Encounter (Signed)
Pt was on the TCM report had to call pt this am. Spoke w/wife sending Dr. Alain Marion TCM report on pt/wife issues.Marland KitchenJohny [name]

## 2018-11-25 NOTE — Telephone Encounter (Signed)
Cumulative time during 7-day interval 11 min, there was not an associated office visit for this concern within a 7 day period. Verbal consent for services obtained from patient prior to services given. Names of all persons present for services: Walker Kehr, MD, Chief complaint: post-hospital f/u History, background, results pertinent: d/c'd on 4/6 after probable COVID19 ARF, confusion and a small ?CVA  [name] is tired, ?a bit psychotic from Plaquenil (on his last day)  Past Medical History:  Diagnosis Date  . Carotid stenosis, left   . Cataracts, bilateral    removed  . Coronary artery disease   . Hyperlipemia   . Prostate CA ([place])   . Sleep apnea    Dr Ledora Bottcher ; uses CPAP  . Type II or unspecified type diabetes mellitus without mention of complication, not stated as uncontrolled   . Unspecified essential hypertension     Per hx:   Ercole Georg Erdmanis a 78 y.o.year old [place] medical history significant for HTN, HLD, type 2 diabetes, alcohol abusewho presented on 4/1/2020with strokelike symptoms with code stroke being called on arrival to ED. Initially found to have fever in setting of altered mental status and underwent LP which was unremarkable. Patient then developed tachycardia, tachypnea, lactic acidosis, and hypoxia requiring 4 L nasal cannula and chest x-ray with diffuse infiltrates. Patient was admitted to ICU level of care due to acute hypoxic respiratory failure and sepsis secondary to CO VID pneumonia despite outpatient PCR negative given patient's significant clinical presentation and known contact with coworker with COVID per ID recommendations.   A/P/next steps: ?COVID19: d/c plaquenil due to side effects. Cont all other meds  11 min

## 2018-11-25 NOTE — Telephone Encounter (Signed)
Dr. Alain [name] pt is not able to do an virtual visit.[name] they do not have any Internet access. Wife is requesting you to give them a call. See phone note below concerning issues w/medication.Marland KitchenJohny Chess

## 2018-12-03 ENCOUNTER — Ambulatory Visit (INDEPENDENT_AMBULATORY_CARE_PROVIDER_SITE_OTHER): Payer: Medicare Other | Admitting: Internal Medicine

## 2018-12-03 DIAGNOSIS — R059 Cough, unspecified: Secondary | ICD-10-CM

## 2018-12-03 DIAGNOSIS — R05 Cough: Secondary | ICD-10-CM

## 2018-12-03 MED ORDER — HYDROCODONE-HOMATROPINE 5-1.5 MG/5ML PO SYRP: 5.0000 mL | ORAL_SOLUTION | Freq: Four times a day (QID) | ORAL | 0 refills | Status: DC | PRN

## 2018-12-03 NOTE — Telephone Encounter (Addendum)
Patient's wife is concerned because her husband has has asymptomatic pnuenomia before and she is very nervous about his sinus symptoms and his non productive cough at this time. Patient sounds like he should be moving chest congestion- but nothing comes up. Patient is drinking plenty of fluids and treating cough with cough drops. Cough not improving and patient has developed runny nose for a couple days.  Call to office- they are going to set up another call for patient to talk to PCP about symptoms.

## 2018-12-03 NOTE — Telephone Encounter (Signed)
  Reason for Disposition . SEVERE coughing spells (e.g., whooping sound after coughing, vomiting after coughing)  Protocols used: COUGH - ACUTE NON-PRODUCTIVE-A-AH

## 2018-12-03 NOTE — Addendum Note (Signed)
Addended by: Cassandria Anger on: 12/03/2018 10:43 AM   Modules accepted: Orders

## 2018-12-03 NOTE — Telephone Encounter (Signed)
  Answer Assessment - Initial Assessment Questions 1. ONSET: "When did the cough begin?"      Patient developed cough after coming home from hospital- ( 2 days after coming home) 2. SEVERITY: "How bad is the cough today?"      Chest seems clear- but voice seems raspy and can tell he has head cold. Sinus issues 3. RESPIRATORY DISTRESS: "Describe your breathing."      No problems with breathing 4. FEVER: "Do you have a fever?" If so, ask: "What is your temperature, how was it measured, and when did it start?"     No fever- 97. 5. HEMOPTYSIS: "Are you coughing up any blood?" If so ask: "How much?" (flecks, streaks, tablespoons, etc.)     Not coughing up anything 6. TREATMENT: "What have you done so far to treat the cough?" (e.g., meds, fluids, humidifier)     Cough drops, patient wears CPAP 7. CARDIAC HISTORY: "Do you have any history of heart disease?" (e.g., heart attack, congestive heart failure)      Patient was just released from hospital- stroke 8. LUNG HISTORY: "Do you have any history of lung disease?"  (e.g., pulmonary embolus, asthma, emphysema)     Pneumonia, possible COVID-19 9. PE RISK FACTORS: "Do you have a history of blood clots?" (or: recent major surgery, recent prolonged travel, bedridden)     no 10. OTHER SYMPTOMS: "Do you have any other symptoms? (e.g., runny nose, wheezing, chest pain)       Runny nose- mucus is clear- a lot of mucus 11. PREGNANCY: "Is there any chance you are pregnant?" "When was your last menstrual period?"       n/a 12. TRAVEL: "Have you traveled out of the country in the last month?" (e.g., travel history, exposures)       No travel  Protocols used: [place]

## 2018-12-03 NOTE — Telephone Encounter (Signed)
I returned call to wife Terique Kawabata. (on [place])  While triaging her regarding her husband my phone kept cutting in and out.   She wasn't able to hear me.    It finally disconnected [place].   She returned the call and asked for me however the agent was still having trouble hearing me so I requested she put Mrs. Hohn with another nurse since my phone is not working.    See triage notes below.   Pt was recently in the hospital for a stroke and pneumonia.   They were treating him for the COVID-19 virus.  His test came back negative.    Wife is concerned about her husband coughing and having chest congestion.   Also he is sneezing a lot.   She was wanting to know what she should give him for the cough and chest congestion.   I wasn't able to advise her due to being disconnected.   (See above note).  I did not send these notes to Dr. Alain Marion since another triage RN is going to talk with her.   Reason for Disposition . [1] Known COPD or other severe lung disease (i.e., bronchiectasis, cystic fibrosis, lung surgery) AND [2] worsening symptoms (i.e., increased sputum purulence or amount, increased breathing difficulty    Just out of the hospital for pneumonia and stroke  11/19/2018-11/24/2018.  Wife concerned about coughing and chest congestion.   COVID negative.  No fever.  Answer Assessment - Initial Assessment Questions 1. ONSET: "When did the cough begin?"    Wife calling in.  Husband with her.  Wife giving sugar free cough drops.  He had pneumonia and was in the hospital.  He developed a cough shortly after leaving the hospital.    2. SEVERITY: "How bad is the cough today?"      He is coughing and sneezing a lot morning.   No fever.   COVID-19 test was negative 3. RESPIRATORY DISTRESS: "Describe your breathing."      No trouble breathing.   I'm afraid it could go into pneumonia. 4. FEVER: "Do you have a fever?" If so, ask: "What is your temperature, how was it measured, and when did it start?"      No 5. SPUTUM: "Describe the color of your sputum" (clear, white, yellow, green)      6. HEMOPTYSIS: "Are you coughing up any blood?" If so ask: "How much?" (flecks, streaks, tablespoons, etc.)     *No Answer* 7. CARDIAC HISTORY: "Do you have any history of heart disease?" (e.g., heart attack, congestive heart failure)      *No Answer* 8. LUNG HISTORY: "Do you have any history of lung disease?"  (e.g., pulmonary embolus, asthma, emphysema)     Just in the hospital for respiratory failure, stroke and pneumonia. 9. PE RISK FACTORS: "Do you have a history of blood clots?" (or: recent major surgery, recent prolonged travel, bedridden)     *No Answer* 10. OTHER SYMPTOMS: "Do you have any other symptoms?" (e.g., runny nose, wheezing, chest pain)       Sneezing a lot  11. PREGNANCY: "Is there any chance you are pregnant?" "When was your last menstrual period?"       N/A 12. TRAVEL: "Have you traveled out of the country in the last month?" (e.g., travel history, exposures)       Not asked  Protocols used: [place]

## 2018-12-03 NOTE — Telephone Encounter (Addendum)
Cumulative time during 7-day interval 11 min, there was not an associated office visit for this concern within a 7 day period. Verbal consent for services obtained from patient prior to services given. Names of all persons present for services: Walker Kehr, MD,  Chief complaint: dry cough, no fever; no confusion History, background, results pertinent:  Past Medical History:  Diagnosis Date  . Carotid stenosis, left   . Cataracts, bilateral    removed  . Coronary artery disease   . Hyperlipemia   . Prostate CA ([place])   . Sleep apnea    Dr Ledora Bottcher ; uses CPAP  . Type II or unspecified type diabetes mellitus without mention of complication, not stated as uncontrolled   . Unspecified essential hypertension    @RESULTS48 @ A/P/next steps: Post-URI/COVID cough. Will call in [place]. Report back if not better, if fever etc. Encephalopathy - resolved

## 2018-12-22 ENCOUNTER — Other Ambulatory Visit: Payer: Self-pay

## 2018-12-22 ENCOUNTER — Other Ambulatory Visit (INDEPENDENT_AMBULATORY_CARE_PROVIDER_SITE_OTHER): Payer: Medicare Other

## 2018-12-22 ENCOUNTER — Encounter: Payer: Self-pay | Admitting: Internal Medicine

## 2018-12-22 ENCOUNTER — Ambulatory Visit (INDEPENDENT_AMBULATORY_CARE_PROVIDER_SITE_OTHER): Payer: Medicare Other | Admitting: Internal Medicine

## 2018-12-22 DIAGNOSIS — Z20828 Contact with and (suspected) exposure to other viral communicable diseases: Secondary | ICD-10-CM

## 2018-12-22 DIAGNOSIS — E1159 Type 2 diabetes mellitus with other circulatory complications: Secondary | ICD-10-CM | POA: Diagnosis not present

## 2018-12-22 DIAGNOSIS — E118 Type 2 diabetes mellitus with unspecified complications: Secondary | ICD-10-CM | POA: Diagnosis not present

## 2018-12-22 DIAGNOSIS — G9340 Encephalopathy, unspecified: Secondary | ICD-10-CM

## 2018-12-22 DIAGNOSIS — I10 Essential (primary) hypertension: Secondary | ICD-10-CM

## 2018-12-22 DIAGNOSIS — Z20822 Contact with and (suspected) exposure to covid-19: Secondary | ICD-10-CM

## 2018-12-22 DIAGNOSIS — I639 Cerebral infarction, unspecified: Secondary | ICD-10-CM | POA: Diagnosis not present

## 2018-12-22 LAB — HEPATIC FUNCTION PANEL
ALT: 13 U/L (ref 0–53)
AST: 13 U/L (ref 0–37)
Albumin: 4.2 g/dL (ref 3.5–5.2)
Alkaline Phosphatase: 59 U/L (ref 39–117)
Bilirubin, Direct: 0.1 mg/dL (ref 0.0–0.3)
Total Bilirubin: 0.6 mg/dL (ref 0.2–1.2)
Total Protein: 6.8 g/dL (ref 6.0–8.3)

## 2018-12-22 LAB — BASIC METABOLIC PANEL
BUN: 19 mg/dL (ref 6–23)
CO2: 29 mEq/L (ref 19–32)
Calcium: 9.5 mg/dL (ref 8.4–10.5)
Chloride: 103 mEq/L (ref 96–112)
Creatinine, Ser: 1.3 mg/dL (ref 0.40–1.50)
GFR: 53.51 mL/min — ABNORMAL LOW (ref >60.00)
Glucose, Bld: 118 mg/dL — ABNORMAL HIGH (ref 70–99)
Potassium: 4.3 mEq/L (ref 3.5–5.1)
Sodium: 141 mEq/L (ref 135–145)

## 2018-12-22 LAB — HEMOGLOBIN A1C: Hgb A1c MFr Bld: 6.8 % — ABNORMAL HIGH (ref 4.6–6.5)

## 2018-12-22 MED ORDER — METFORMIN HCL 1000 MG PO TABS: 1000.0000 mg | ORAL_TABLET | Freq: Two times a day (BID) | ORAL | 11 refills | Status: DC

## 2018-12-22 MED ORDER — SIMVASTATIN 40 MG PO TABS: 40.0000 mg | ORAL_TABLET | Freq: Every evening | ORAL | 11 refills | Status: DC

## 2018-12-22 MED ORDER — THIAMINE HCL 100 MG PO TABS: 100.0000 mg | ORAL_TABLET | Freq: Every day | ORAL | 5 refills | Status: DC

## 2018-12-22 MED ORDER — HYDRALAZINE HCL 25 MG PO TABS: 25.0000 mg | ORAL_TABLET | Freq: Two times a day (BID) | ORAL | 11 refills | Status: DC

## 2018-12-22 MED ORDER — CARVEDILOL 12.5 MG PO TABS: 12.5000 mg | ORAL_TABLET | Freq: Two times a day (BID) | ORAL | 11 refills | Status: DC

## 2018-12-22 MED ORDER — FOLIC ACID 1 MG PO TABS: 1.0000 mg | ORAL_TABLET | Freq: Every day | ORAL | 3 refills | Status: DC

## 2018-12-22 NOTE — Assessment & Plan Note (Signed)
?  COVID 19 related - April 2020, resolved COVID 19 IgM, IgG

## 2018-12-22 NOTE — Progress Notes (Signed)
Subjective:  Patient ID: Larry [name], male    DOB: May 12, 1941  Age: 78 y.o. MRN: [id number]  CC: No chief complaint on file.   HPI [name] presents for URI, resp failure (COVID 19 (-)), encephalopathy f/u. Pt lost wt, doing well...  Post-hosp d/c:  "History of present illness:  Larry [name] a 78 y.o.year old [place] medical history significant for HTN, HLD, type 2 diabetes, alcohol abusewho presented on 4/1/2020with strokelike symptoms with code stroke being called on arrival to ED. Initially found to have fever in setting of altered mental status and underwent LP which was unremarkable. Patient then developed tachycardia, tachypnea, lactic acidosis, and hypoxia requiring 4 L nasal cannula and chest x-ray with diffuse infiltrates. Patient was admitted to ICU level of care due to acute hypoxic respiratory failure and sepsis secondary to CO VID pneumonia despite outpatient PCR negative given patient's significant clinical presentation and known contact with coworker with COVID per ID recommendations. Remaining hospital course addressed in problem based format below:   Hospital Course:   Acute hypoxic respiratory failure andsevere sepsis with end organ dysfunction secondary toCOVIDpneumonia, resolved Initially on arrival was found to have 4 L nasal cannula before having to be intubated on day of admission due to worsening mental status and inability to protect airway, patient was extubated on 4/3 is able to wean back to room air.Outpatient PCR for CO VID was negative but ID recommended treating as positive given highly suspicious clinical symptoms/syndrome as well as known sick contact (co-worker).  RVP, flu panel was negative community-acquired pneumonia was also covered with IV ceftriaxone and azithromycin before transitioning to cefdinir prior to discharge patient was able to ambulate and maintain normal oxygenation.  He will continue with Plaquenil and cefdinir  therapy at home.  Social contract and instructions were provided regarding self quarantining, appropriate social distancing on discharge  Acute metabolic encephalopathy, resolved. Likely all driven by sepsis from above infection. LP negative for infection on admission alert and oriented x4 currently. Does have alcohol history and required Precedex drip during ICU  Concern for possible left-sided CVA CTA with no acute stroke, CT perfusion to the left occipital showed LEFT occipital penumbra currently does have small right facial droop. Given patient is being treated as CO VID positive neurology does not recommend MRI brain at this time as this is likely combination of encephalopathy from sepsis versus possible mild stroke.  He will continue aspirin 325 mg on discharge and has follow-up with neurology in 6 weeks arranged  AKI on CKD, resolved Creatinine stable at baseline.   Instructed to hold losartan until follow-up with PCP  Type 2 diabetes.  Held home oral hypoglycemics during hospital stay can reinitiate on discharge.  Alcohol abuse No signs of withdrawal during hospital stay. Folic acid, multivitamin,Thiamine  HTN, improving Started on carvedilol and hydralazine here.  Encouraged to discontinue metoprolol on discharge.  We will continue to hold on losartan given AKI on CKD and to follow with PCP.    Hyperlipidemia home simvastatin   Consultations:  Critical care, neurology, infectious disease  Procedures:  Intubation 4/1, extubation 4/3    Discharge Exam: BP (!) 161/92    Pulse (!) 109    Temp 99.5 F (37.5 C) (Oral)    Resp 18    Ht 6' (1.829 m)    Wt 115.4 kg    SpO2 98%    BMI 34.50 kg/m "    Outpatient Medications Prior to Visit  Medication Sig Dispense  Refill   ACCU-CHEK FASTCLIX LANCETS MISC 1 each by Does not apply route 2 (two) times daily. Dx:E11.9 100 each 11   aspirin 325 MG tablet Take 1 tablet (325 mg total) by mouth daily. 60 tablet  0   carvedilol (COREG) 12.5 MG tablet Take 1 tablet (12.5 mg total) by mouth 2 (two) times daily with a meal. 60 tablet 0   Cholecalciferol (VITAMIN D3) 1000 UNITS CAPS Take 1,000 Units by mouth daily.      folic acid (FOLVITE) 1 MG tablet Take 1 tablet (1 mg total) by mouth daily. 30 tablet 0   glimepiride (AMARYL) 2 MG tablet TAKE 1 TABLET BY MOUTH TWICE DAILY (Patient taking differently: Take 2 mg by mouth 2 (two) times daily. ) 180 tablet 3   glucose monitoring kit (FREESTYLE) monitoring kit 1 each by Does not apply route as needed for other. Please dispense brand of choice per ins/patient. Dx: 250.00. 1 each 0   hydrALAZINE (APRESOLINE) 25 MG tablet Take 1 tablet (25 mg total) by mouth every 8 (eight) hours. 90 tablet 0   losartan (COZAAR) 100 MG tablet Take 1 tablet (100 mg total) by mouth daily. HOLD UNTIL SEEN BY YOUR PCP 90 tablet 3   metFORMIN (GLUCOPHAGE) 1000 MG tablet Take 1,000 mg by mouth 2 (two) times daily with a meal.     Multiple Vitamins-Minerals (MACULAR VITAMIN BENEFIT) TABS Take 1 tablet by mouth 2 (two) times daily.     simvastatin (ZOCOR) 40 MG tablet Take 40 mg by mouth every evening.      thiamine 100 MG tablet Take 1 tablet (100 mg total) by mouth daily. 30 tablet 0   cefdinir (OMNICEF) 300 MG capsule Take 1 capsule (300 mg total) by mouth every 12 (twelve) hours. (Patient not taking: Reported on 12/22/2018) 2 capsule 0   furosemide (LASIX) 20 MG tablet Take 1 tablet (20 mg total) by mouth daily. (Patient taking differently: Take 10 mg by mouth daily. ) 90 tablet 3   HYDROcodone-homatropine (HYCODAN) 5-1.5 MG/5ML syrup Take 5 mLs by mouth every 6 (six) hours as needed for cough. (Patient not taking: Reported on 12/22/2018) 240 mL 0   nitroGLYCERIN (NITROSTAT) 0.4 MG SL tablet Place 1 tablet (0.4 mg total) under the tongue every 5 (five) minutes as needed for chest pain. (Patient not taking: Reported on 12/22/2018) 20 tablet 3   No facility-administered  medications prior to visit.     ROS: Review of Systems  Constitutional: Negative for appetite change, fatigue and unexpected weight change.  HENT: Negative for congestion, nosebleeds, sneezing, sore throat and trouble swallowing.   Eyes: Negative for itching and visual disturbance.  Respiratory: Negative for cough.   Cardiovascular: Positive for leg swelling. Negative for chest pain and palpitations.  Gastrointestinal: Negative for abdominal distention, blood in stool, diarrhea and nausea.  Genitourinary: Negative for frequency and hematuria.  Musculoskeletal: Negative for back pain, gait problem, joint swelling and neck pain.  Skin: Negative for rash.  Neurological: Negative for dizziness, tremors, speech difficulty and weakness.  Psychiatric/Behavioral: Negative for agitation, dysphoric mood, sleep disturbance and suicidal ideas. The patient is not nervous/anxious.     Objective:  BP 130/70 (BP Location: Left Arm, Patient Position: Sitting, Cuff Size: Normal)    Pulse 68    Temp 98.3 F (36.8 C) (Oral)    Ht 6' (1.829 m)    Wt 247 lb 4 oz (112.2 kg)    SpO2 95%    BMI 33.53 kg/m  BP Readings from Last 3 Encounters:  12/22/18 130/70  11/24/18 (!) 161/92  11/04/18 (!) 152/74    Wt Readings from Last 3 Encounters:  12/22/18 247 lb 4 oz (112.2 kg)  11/23/18 254 lb 6.6 oz (115.4 kg)  11/04/18 251 lb (113.9 kg)    Physical Exam Constitutional:      General: He is not in acute distress.    Appearance: He is well-developed.     Comments: NAD  Eyes:     Conjunctiva/sclera: Conjunctivae normal.     Pupils: Pupils are equal, round, and reactive to light.  Neck:     Musculoskeletal: Normal range of motion.     Thyroid: No thyromegaly.     Vascular: No JVD.  Cardiovascular:     Rate and Rhythm: Normal rate and regular rhythm.     Heart sounds: Normal heart sounds. No murmur. No friction rub. No gallop.   Pulmonary:     Effort: Pulmonary effort is normal. No respiratory  distress.     Breath sounds: Normal breath sounds. No wheezing or rales.  Chest:     Chest wall: No tenderness.  Abdominal:     General: Bowel sounds are normal. There is no distension.     Palpations: Abdomen is soft. There is no mass.     Tenderness: There is no abdominal tenderness. There is no guarding or rebound.  Musculoskeletal: Normal range of motion.        General: No tenderness.  Lymphadenopathy:     Cervical: No cervical adenopathy.  Skin:    General: Skin is warm and dry.     Findings: No rash.  Neurological:     Mental Status: He is alert and oriented to person, place, and time.     Cranial Nerves: No cranial nerve deficit.     Motor: No abnormal muscle tone.     Coordination: Coordination normal.     Gait: Gait normal.     Deep Tendon Reflexes: Reflexes are normal and symmetric.  Psychiatric:        Behavior: Behavior normal.        Thought Content: Thought content normal.        Judgment: Judgment normal.   ankles are less swollen - 1+ B  Lab Results  Component Value Date   WBC 13.9 (H) 11/23/2018   HGB 13.5 11/23/2018   HCT 43.1 11/23/2018   PLT 232 11/23/2018   GLUCOSE 222 (H) 11/23/2018   CHOL 129 11/20/2018   TRIG 113 11/20/2018   HDL 38 (L) 11/20/2018   LDLCALC 68 11/20/2018   ALT 19 11/23/2018   AST 20 11/23/2018   NA 142 11/23/2018   K 4.2 11/23/2018   CL 106 11/23/2018   CREATININE 1.50 (H) 11/23/2018   BUN 24 (H) 11/23/2018   CO2 20 (L) 11/23/2018   TSH 2.19 03/25/2018   PSA 0.00 Repeated and verified X2. (L) 12/26/2012   INR 1.0 11/19/2018   HGBA1C 6.8 (H) 11/20/2018    Ct Angio Head W Or Wo Contrast  Result Date: 11/19/2018 CLINICAL DATA:  Aphasia and confusion. Follow up code stroke. History of LEFT carotid endarterectomy, hypertension and diabetes. EXAM: CT ANGIOGRAPHY HEAD AND NECK CT PERFUSION BRAIN TECHNIQUE: Multidetector CT imaging of the head and neck was performed using the standard protocol during bolus administration of  intravenous contrast. Multiplanar CT image reconstructions and MIPs were obtained to evaluate the vascular anatomy. Carotid stenosis measurements (when applicable) are obtained utilizing NASCET criteria, using the  distal internal carotid diameter as the denominator. Multiphase CT imaging of the brain was performed following IV bolus contrast injection. Subsequent parametric perfusion maps were calculated using RAPID software. CONTRAST:  167m OMNIPAQUE IOHEXOL 350 MG/ML SOLN COMPARISON:  CT HEAD November 19, 2018 and CT angiogram neck August 01, 2017 FINDINGS: CTA NECK FINDINGS: AORTIC ARCH: Normal appearance of the thoracic arch, 2 vessel arch is a normal variant. Mild calcific atherosclerosis and intimal thickening. The origins of the innominate, left Common carotid artery and subclavian artery are patent. RIGHT CAROTID SYSTEM: Common carotid artery is patent. Moderate calcific atherosclerosis of the carotid bifurcation without hemodynamically significant stenosis by NASCET criteria. Normal appearance of the internal carotid artery. LEFT CAROTID SYSTEM: Common carotid artery is patent, moderate intimal thickening and mild stenosis. Status post LEFT carotid endarterectomy. Within 1 cm of the origin is and 11 mm segment of 60% stenosis by NASCET criteria. VERTEBRAL ARTERIES:Codominant vertebral arteries. Moderate to severe bilateral V1 segment stenosis. SKELETON: No acute osseous process though bone windows have not been submitted. OTHER NECK: Soft tissues of the neck are nonacute though, not tailored for evaluation. UPPER CHEST: Small bilateral pleural effusions. Interlobular septal thickening, bronchial wall thickening and mosaic attenuation seen with pulmonary edema. Status post median sternotomy. CTA HEAD FINDINGS: ANTERIOR CIRCULATION: Patent cervical internal carotid arteries, petrous, cavernous and supra clinoid internal carotid arteries. Calcific atherosclerosis resulting in moderate stenosis bilateral carotid  siphon. Patent anterior communicating artery. Patent anterior and middle cerebral arteries. Moderate stenosis RIGHT A2 segment, RIGHT M2 No large vessel occlusion, flow-limiting stenosis, contrast extravasation or aneurysm. POSTERIOR CIRCULATION: Patent vertebral arteries, vertebrobasilar junction and basilar artery, as well as main branch vessels. Calcific atherosclerosis resulting in moderate stenosis moderate stenosis distal RIGHT V4 segment. Patent posterior cerebral arteries, moderate luminal irregularity compatible with atherosclerosis. No large vessel occlusion, flow-limiting stenosis, contrast extravasation or aneurysm. VENOUS SINUSES: Major dural venous sinuses are patent though not tailored for evaluation on this angiographic examination. ANATOMIC VARIANTS: Hypoplastic LEFT A1 segment. DELAYED PHASE: Not performed. MIP images reviewed. CT Brain Perfusion Findings: CBF (<30%) Volume: 025mPerfusion (Tmax>6.0s) volume: 38m73mismatch Volume: 38mL78mfarction Location:LEFT occipital lobe. IMPRESSION: CTA NECK: 1. Status post LEFT carotid endarterectomy with 60% stenosis LEFT ICA by NASCET criteria. 2. Patent vertebral arteries. Moderate to severe stenosis bilateral V1 segments. 3. Small pleural effusions and findings of pulmonary edema. Recommend chest radiograph. CTA HEAD: 1. No emergent large vessel occlusion. 2. Moderate stenosis bilateral ICA. RIGHT vertebral artery, anterior, middle and posterior cerebral arteries multifocal moderate stenoses compatible with atherosclerosis. CT PERFUSION: 1. 4 cc LEFT occipital penumbra without core infarct. Critical Value/emergent results text paged to Dr[facility] AMION secure system on 11/19/2018 at 3:57 am, including interpreting physician's phone number. Electronically Signed   By: CourElon Alas.   On: 11/19/2018 04:00   Ct Angio Neck W Or Wo Contrast  Result Date: 11/19/2018 CLINICAL DATA:  Aphasia and confusion. Follow up code stroke. History of  LEFT carotid endarterectomy, hypertension and diabetes. EXAM: CT ANGIOGRAPHY HEAD AND NECK CT PERFUSION BRAIN TECHNIQUE: Multidetector CT imaging of the head and neck was performed using the standard protocol during bolus administration of intravenous contrast. Multiplanar CT image reconstructions and MIPs were obtained to evaluate the vascular anatomy. Carotid stenosis measurements (when applicable) are obtained utilizing NASCET criteria, using the distal internal carotid diameter as the denominator. Multiphase CT imaging of the brain was performed following IV bolus contrast injection. Subsequent parametric perfusion maps were calculated using RAPID software. CONTRAST:  1652m OMNIPAQUE IOHEXOL 350 MG/ML SOLN COMPARISON:  CT HEAD November 19, 2018 and CT angiogram neck August 01, 2017 FINDINGS: CTA NECK FINDINGS: AORTIC ARCH: Normal appearance of the thoracic arch, 2 vessel arch is a normal variant. Mild calcific atherosclerosis and intimal thickening. The origins of the innominate, left Common carotid artery and subclavian artery are patent. RIGHT CAROTID SYSTEM: Common carotid artery is patent. Moderate calcific atherosclerosis of the carotid bifurcation without hemodynamically significant stenosis by NASCET criteria. Normal appearance of the internal carotid artery. LEFT CAROTID SYSTEM: Common carotid artery is patent, moderate intimal thickening and mild stenosis. Status post LEFT carotid endarterectomy. Within 1 cm of the origin is and 11 mm segment of 60% stenosis by NASCET criteria. VERTEBRAL ARTERIES:Codominant vertebral arteries. Moderate to severe bilateral V1 segment stenosis. SKELETON: No acute osseous process though bone windows have not been submitted. OTHER NECK: Soft tissues of the neck are nonacute though, not tailored for evaluation. UPPER CHEST: Small bilateral pleural effusions. Interlobular septal thickening, bronchial wall thickening and mosaic attenuation seen with pulmonary edema. Status post  median sternotomy. CTA HEAD FINDINGS: ANTERIOR CIRCULATION: Patent cervical internal carotid arteries, petrous, cavernous and supra clinoid internal carotid arteries. Calcific atherosclerosis resulting in moderate stenosis bilateral carotid siphon. Patent anterior communicating artery. Patent anterior and middle cerebral arteries. Moderate stenosis RIGHT A2 segment, RIGHT M2 No large vessel occlusion, flow-limiting stenosis, contrast extravasation or aneurysm. POSTERIOR CIRCULATION: Patent vertebral arteries, vertebrobasilar junction and basilar artery, as well as main branch vessels. Calcific atherosclerosis resulting in moderate stenosis moderate stenosis distal RIGHT V4 segment. Patent posterior cerebral arteries, moderate luminal irregularity compatible with atherosclerosis. No large vessel occlusion, flow-limiting stenosis, contrast extravasation or aneurysm. VENOUS SINUSES: Major dural venous sinuses are patent though not tailored for evaluation on this angiographic examination. ANATOMIC VARIANTS: Hypoplastic LEFT A1 segment. DELAYED PHASE: Not performed. MIP images reviewed. CT Brain Perfusion Findings: CBF (<30%) Volume: 08mPerfusion (Tmax>6.0s) volume: 52m31mismatch Volume: 52mL17mfarction Location:LEFT occipital lobe. IMPRESSION: CTA NECK: 1. Status post LEFT carotid endarterectomy with 60% stenosis LEFT ICA by NASCET criteria. 2. Patent vertebral arteries. Moderate to severe stenosis bilateral V1 segments. 3. Small pleural effusions and findings of pulmonary edema. Recommend chest radiograph. CTA HEAD: 1. No emergent large vessel occlusion. 2. Moderate stenosis bilateral ICA. RIGHT vertebral artery, anterior, middle and posterior cerebral arteries multifocal moderate stenoses compatible with atherosclerosis. CT PERFUSION: 1. 4 cc LEFT occipital penumbra without core infarct. Critical Value/emergent results text paged to [facility] AMION secure system on 11/19/2018 at 3:57 am, including  interpreting physician's phone number. Electronically Signed   By: CourElon Alas.   On: 11/19/2018 04:00   Ct Cerebral Perfusion W Contrast  Result Date: 11/19/2018 CLINICAL DATA:  Aphasia and confusion. Follow up code stroke. History of LEFT carotid endarterectomy, hypertension and diabetes. EXAM: CT ANGIOGRAPHY HEAD AND NECK CT PERFUSION BRAIN TECHNIQUE: Multidetector CT imaging of the head and neck was performed using the standard protocol during bolus administration of intravenous contrast. Multiplanar CT image reconstructions and MIPs were obtained to evaluate the vascular anatomy. Carotid stenosis measurements (when applicable) are obtained utilizing NASCET criteria, using the distal internal carotid diameter as the denominator. Multiphase CT imaging of the brain was performed following IV bolus contrast injection. Subsequent parametric perfusion maps were calculated using RAPID software. CONTRAST:  100mL21mIPAQUE IOHEXOL 350 MG/ML SOLN COMPARISON:  CT HEAD November 19, 2018 and CT angiogram neck August 01, 2017 FINDINGS: CTA NECK FINDINGS: AORTIC ARCH: Normal appearance of the thoracic arch, 2  vessel arch is a normal variant. Mild calcific atherosclerosis and intimal thickening. The origins of the innominate, left Common carotid artery and subclavian artery are patent. RIGHT CAROTID SYSTEM: Common carotid artery is patent. Moderate calcific atherosclerosis of the carotid bifurcation without hemodynamically significant stenosis by NASCET criteria. Normal appearance of the internal carotid artery. LEFT CAROTID SYSTEM: Common carotid artery is patent, moderate intimal thickening and mild stenosis. Status post LEFT carotid endarterectomy. Within 1 cm of the origin is and 11 mm segment of 60% stenosis by NASCET criteria. VERTEBRAL ARTERIES:Codominant vertebral arteries. Moderate to severe bilateral V1 segment stenosis. SKELETON: No acute osseous process though bone windows have not been submitted. OTHER  NECK: Soft tissues of the neck are nonacute though, not tailored for evaluation. UPPER CHEST: Small bilateral pleural effusions. Interlobular septal thickening, bronchial wall thickening and mosaic attenuation seen with pulmonary edema. Status post median sternotomy. CTA HEAD FINDINGS: ANTERIOR CIRCULATION: Patent cervical internal carotid arteries, petrous, cavernous and supra clinoid internal carotid arteries. Calcific atherosclerosis resulting in moderate stenosis bilateral carotid siphon. Patent anterior communicating artery. Patent anterior and middle cerebral arteries. Moderate stenosis RIGHT A2 segment, RIGHT M2 No large vessel occlusion, flow-limiting stenosis, contrast extravasation or aneurysm. POSTERIOR CIRCULATION: Patent vertebral arteries, vertebrobasilar junction and basilar artery, as well as main branch vessels. Calcific atherosclerosis resulting in moderate stenosis moderate stenosis distal RIGHT V4 segment. Patent posterior cerebral arteries, moderate luminal irregularity compatible with atherosclerosis. No large vessel occlusion, flow-limiting stenosis, contrast extravasation or aneurysm. VENOUS SINUSES: Major dural venous sinuses are patent though not tailored for evaluation on this angiographic examination. ANATOMIC VARIANTS: Hypoplastic LEFT A1 segment. DELAYED PHASE: Not performed. MIP images reviewed. CT Brain Perfusion Findings: CBF (<30%) Volume: 70m Perfusion (Tmax>6.0s) volume: 451mMismatch Volume: 62m33mnfarction Location:LEFT occipital lobe. IMPRESSION: CTA NECK: 1. Status post LEFT carotid endarterectomy with 60% stenosis LEFT ICA by NASCET criteria. 2. Patent vertebral arteries. Moderate to severe stenosis bilateral V1 segments. 3. Small pleural effusions and findings of pulmonary edema. Recommend chest radiograph. CTA HEAD: 1. No emergent large vessel occlusion. 2. Moderate stenosis bilateral ICA. RIGHT vertebral artery, anterior, middle and posterior cerebral arteries multifocal  moderate stenoses compatible with atherosclerosis. CT PERFUSION: 1. 4 cc LEFT occipital penumbra without core infarct. Critical Value/emergent results text paged to Dr[facility] AMION secure system on 11/19/2018 at 3:57 am, including interpreting physician's phone number. Electronically Signed   By: CouElon AlasD.   On: 11/19/2018 04:00   Dg Chest Port 1 View  Result Date: 11/20/2018 CLINICAL DATA:  Hypoxia.  History of endotracheal tube EXAM: PORTABLE CHEST 1 VIEW COMPARISON:  Yesterday FINDINGS: Endotracheal tube tip just below the clavicular heads. The orogastric tube is difficult to visualize separate from overlapping EKG leads. The side port appears to overlap the thoracic inlet, need correlation with catheter length. Low volume chest with hazy opacity at the bases. There may be pleural fluid. Stable heart size. CABG. No pneumothorax. These results will be called to the ordering clinician or representative by the Radiologist Assistant, and communication documented in the PACS or zVision Dashboard. IMPRESSION: 1. Uncertain positioning of the orogastric tube, the side port appears to project over the thoracic inlet-please correlate with catheter length. 2. Stable low volume chest. Electronically Signed   By: JonMonte FantasiaD.   On: 11/20/2018 07:01   Portable Chest X-ray  Result Date: 11/19/2018 CLINICAL DATA:  Endotracheal tube placement EXAM: PORTABLE CHEST 1 VIEW COMPARISON:  11/19/2018 FINDINGS: Endotracheal tube with the tip 5 cm above above  the carina. Nasogastric tube coursing below the diaphragm. Bilateral mild interstitial thickening similar in appearance to the prior exam. No pleural effusion or pneumothorax. Stable cardiomediastinal silhouette. Prior CABG. IMPRESSION: 1. Endotracheal tube with the tip 5 cm above the carina. 2. Persistent mild CHF. Electronically Signed   By: Kathreen Devoid   On: 11/19/2018 11:28   Dg Chest Portable 1 View  Result Date: 11/19/2018 CLINICAL  DATA:  Hypoxia.  Facial droop EXAM: PORTABLE CHEST 1 VIEW COMPARISON:  08/25/2017 FINDINGS: Diffuse interstitial coarsening with Kerley lines. Cardiomegaly. Prior CABG. There is pleural fluid by preceding neck CTA. IMPRESSION: CHF pattern. Electronically Signed   By: Monte Fantasia M.D.   On: 11/19/2018 04:14   Dg Abd Portable 1v  Result Date: 11/20/2018 CLINICAL DATA:  Orogastric tube placement. EXAM: PORTABLE ABDOMEN - 1 VIEW COMPARISON:  11/20/2018 at 4:22 a.m. FINDINGS: Orogastric tube tip now projects at the gastroesophageal junction. It does not appear to enter the stomach. IMPRESSION: Orogastric tube tip projects at the gastroesophageal junction. This will need to be further inserted, approximately 10 cm, to allow the tube to fully into the stomach. Electronically Signed   By: Lajean Manes M.D.   On: 11/20/2018 09:24   Dg Abd Portable 1v  Result Date: 11/20/2018 CLINICAL DATA:  Impaired orogastric feeding tube EXAM: PORTABLE ABDOMEN - 1 VIEW COMPARISON:  Yesterday FINDINGS: The orogastric tube tip is loops through the lower esophagus with tip at the thoracic inlet based on prior chest x-ray. Relatively gasless abdomen without obstructive process or concerning mass effect. These results will be called to the ordering clinician or representative by the Radiologist Assistant, and communication documented in the PACS or zVision Dashboard. IMPRESSION: Malpositioned orogastric tube which loops through the lower esophagus with tip in the neck region. Electronically Signed   By: Monte Fantasia M.D.   On: 11/20/2018 07:06   Dg Abd Portable 1v  Result Date: 11/20/2018 CLINICAL DATA:  OG tube placement attempt #4 EXAM: PORTABLE ABDOMEN - 1 VIEW COMPARISON:  11/19/2018 at 2159 hours FINDINGS: Enteric tube looped in the distal esophagus with its tip not visualized above the upper edge of the image. IMPRESSION: Enteric tube looped in the distal esophagus with its tip not visualized above the upper edge of the  image. Electronically Signed   By: Julian Hy M.D.   On: 11/20/2018 01:10   Dg Abd Portable 1v  Result Date: 11/20/2018 CLINICAL DATA:  OG tube attempt #3 EXAM: PORTABLE ABDOMEN - 1 VIEW COMPARISON:  11/19/2018 at 2159 hours FINDINGS: Enteric tube terminates in the distal esophagus. IMPRESSION: Enteric tube terminates in the distal esophagus. Electronically Signed   By: Julian Hy M.D.   On: 11/20/2018 01:09   Dg Abd Portable 1v  Result Date: 11/20/2018 CLINICAL DATA:  NG tube placement #2 EXAM: PORTABLE ABDOMEN - 1 VIEW COMPARISON:  11/19/2018 2146 hours FINDINGS: Enteric tube terminates at the GE junction. IMPRESSION: Enteric tube terminates at the GE junction. Electronically Signed   By: Julian Hy M.D.   On: 11/20/2018 01:08   Dg Abd Portable 1v  Result Date: 11/20/2018 CLINICAL DATA:  Nasogastric tube placement. EXAM: PORTABLE ABDOMEN - 1 VIEW COMPARISON:  None. FINDINGS: Nasogastric tube tip projects at GE junction. Contrast in the urinary collecting system, no hydronephrosis. No intra-abdominal mass effect. Bowel gas pattern is nondilated and nonobstructive. Soft tissue planes and included osseous structures are non suspicious. IMPRESSION: Nasogastric tube tip projects at GE junction.  Sing spine Electronically Signed   By:  Elon Alas M.D.   On: 11/20/2018 00:22   Ct Head Code Stroke Wo Contrast  Addendum Date: 11/19/2018   ADDENDUM REPORT: 11/19/2018 03:45 ADDENDUM: Critical Value/emergent results text paged to [place], Neurology via AMION secure system on 11/19/2018 at 3:35 am, including interpreting physician's phone number. Electronically Signed   By: Elon Alas M.D.   On: 11/19/2018 03:45   Result Date: 11/19/2018 CLINICAL DATA:  Code stroke. Aphasia and confusion. History of LEFT carotid endarterectomy, hypertension and diabetes. EXAM: CT HEAD WITHOUT CONTRAST TECHNIQUE: Contiguous axial images were obtained from the base of the skull through the  vertex without intravenous contrast. COMPARISON:  None. FINDINGS: BRAIN: No intraparenchymal hemorrhage, mass effect nor midline shift. No parenchymal brain volume loss for age. No hydrocephalus. Patchy supratentorial white matter hypodensities less than expected for patient's age, though non-specific are most compatible with chronic small vessel ischemic disease. Cystic RIGHT basal ganglia lacunar infarcts. No acute large vascular territory infarcts. No abnormal extra-axial fluid collections. Basal cisterns are patent. VASCULAR: Mild-to-moderate calcific atherosclerosis of the carotid siphons. SKULL: No skull fracture. No significant scalp soft tissue swelling. SINUSES/ORBITS: Trace paranasal sinus mucosal thickening. Mastoid air cells are well aerated.The included ocular globes and orbital contents are non-suspicious. Status post bilateral ocular lens implants. OTHER: None. ASPECTS [facility] Stroke Program Early CT Score) - Ganglionic level infarction (caudate, lentiform nuclei, internal capsule, insula, M1-M3 cortex): 7 - Supraganglionic infarction (M4-M6 cortex): 3 Total score (0-10 with 10 being normal): 10 IMPRESSION: 1. No acute intracranial process. 2. ASPECTS is 10. 3. 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 Electronically Signed: By: Elon Alas M.D. On: 11/19/2018 03:36    Assessment & Plan:   There are no diagnoses linked to this encounter.   No orders of the defined types were placed in this encounter.    Follow-up: No follow-ups on file.  Walker Kehr, MD

## 2018-12-22 NOTE — Assessment & Plan Note (Addendum)
Home CBGs reviewed  On Metformin, Glimepiride

## 2018-12-22 NOTE — Assessment & Plan Note (Signed)
COVID 19 IgM, IgG

## 2018-12-22 NOTE — Assessment & Plan Note (Signed)
Furosemide Toprol

## 2018-12-23 LAB — SAR COV2 SEROLOGY (COVID19)AB(IGG),IA: SARS CoV2 AB IGG: NEGATIVE

## 2019-01-13 ENCOUNTER — Ambulatory Visit (INDEPENDENT_AMBULATORY_CARE_PROVIDER_SITE_OTHER): Payer: Medicare Other | Admitting: Internal Medicine

## 2019-01-13 ENCOUNTER — Ambulatory Visit: Payer: Self-pay

## 2019-01-13 ENCOUNTER — Encounter: Payer: Self-pay | Admitting: Internal Medicine

## 2019-01-13 DIAGNOSIS — R6520 Severe sepsis without septic shock: Secondary | ICD-10-CM

## 2019-01-13 DIAGNOSIS — E662 Morbid (severe) obesity with alveolar hypoventilation: Secondary | ICD-10-CM

## 2019-01-13 DIAGNOSIS — I10 Essential (primary) hypertension: Secondary | ICD-10-CM | POA: Diagnosis not present

## 2019-01-13 MED ORDER — HYDRALAZINE HCL 50 MG PO TABS: 50.0000 mg | ORAL_TABLET | Freq: Three times a day (TID) | ORAL | 3 refills | Status: DC

## 2019-01-13 MED ORDER — CARVEDILOL 25 MG PO TABS: 25.0000 mg | ORAL_TABLET | Freq: Two times a day (BID) | ORAL | 3 refills | Status: DC

## 2019-01-13 NOTE — Progress Notes (Signed)
Virtual Visit via Telephone Note  I connected with Larry [name] on 01/13/19 at  2:00 PM EDT by telephone and verified that I am speaking with the correct person using two identifiers.   I discussed the limitations, risks, security and privacy concerns of performing an evaluation and management service by telephone and the availability of in person appointments. I also discussed with the patient that there may be a patient responsible charge related to this service. The patient expressed understanding and agreed to proceed.   History of Present Illness:   Larry [name] is complaining of his blood pressure being high in the past week.  His blood pressure has been ranging between 180-190/76-82.  He has been taking Coreg 12.5 mg twice a day and hydralazine 25 mg 3 times a day. Observations/Objective:  Bowel sounds normal.  No acute distress.  He tells me that his heart rate has been ranging in the 70-76 range Assessment and Plan:  See plan Follow Up Instructions:    I discussed the assessment and treatment plan with the patient. The patient was provided an opportunity to ask questions and all were answered. The patient agreed with the plan and demonstrated an understanding of the instructions.   The patient was advised to call back or seek an in-person evaluation if the symptoms worsen or if the condition fails to improve as anticipated.  I provided 12 minutes of non-face-to-face time during this encounter.   Walker Kehr, MD

## 2019-01-13 NOTE — Telephone Encounter (Signed)
Pt calling with HTN. BP this morning was 190/71. Pt stated that he has been following his BP and the systolic has ranged 208-022. Pt denies missing any BP med. Pt did mention that he increase hydralazine to three times per day instead of twice daily as ordered.  Pt stated that he is having Back shoulder pain that worsen at night time. Pt has been taking Aleve for the pain and he stated that he does get relief. No other symptoms. Care advice given to pt and pt verbalized understanding. Pt informed visit will be virtual and pt is ok with that. Pt transferred to [facility] to schedule appt.  Reason for Disposition . Systolic BP  >= 336 OR Diastolic >= 122  Answer Assessment - Initial Assessment Questions 1. BLOOD PRESSURE: "What is the blood pressure?" "Did you take at least two measurements 5 minutes apart?"     449/75 systolic elevated 2. ONSET: "When did you take your blood pressure?"     This morning 3. HOW: "How did you obtain the blood pressure?" (e.g., visiting nurse, automatic home BP monitor)     Automatic BP home BP machine 4. HISTORY: "Do you have a history of high blood pressure?"     Yes  5. MEDICATIONS: "Are you taking any medications for blood pressure?" "Have you missed any doses recently?"     Yes- no 6. OTHER SYMPTOMS: "Do you have any symptoms?" (e.g., headache, chest pain, blurred vision, difficulty breathing, weakness)     Back pain shoulder pain worse at night-no help with NSAIDS 7. PREGNANCY: "Is there any chance you are pregnant?" "When was your last menstrual period?"     n/a  Protocols used: HIGH BLOOD PRESSURE-A-AH

## 2019-01-13 NOTE — Assessment & Plan Note (Signed)
Worse.  Increase Coreg to 25 mg twice a day and hydralazine to 50 mg 3 times a day. NAS diet

## 2019-01-24 ENCOUNTER — Emergency Department (HOSPITAL_BASED_OUTPATIENT_CLINIC_OR_DEPARTMENT_OTHER)
Admission: EM | Admit: 2019-01-24 | Discharge: 2019-01-24 | Disposition: A | Discharge status: Home / Self Care | Payer: Medicare Other | Attending: Emergency Medicine | Admitting: Emergency Medicine

## 2019-01-24 ENCOUNTER — Encounter (HOSPITAL_BASED_OUTPATIENT_CLINIC_OR_DEPARTMENT_OTHER): Payer: Self-pay | Admitting: Emergency Medicine

## 2019-01-24 ENCOUNTER — Emergency Department (HOSPITAL_BASED_OUTPATIENT_CLINIC_OR_DEPARTMENT_OTHER): Payer: Medicare Other

## 2019-01-24 ENCOUNTER — Other Ambulatory Visit: Payer: Self-pay

## 2019-01-24 DIAGNOSIS — Z8546 Personal history of malignant neoplasm of prostate: Secondary | ICD-10-CM | POA: Diagnosis not present

## 2019-01-24 DIAGNOSIS — Z79899 Other long term (current) drug therapy: Secondary | ICD-10-CM | POA: Diagnosis not present

## 2019-01-24 DIAGNOSIS — S8002XA Contusion of left knee, initial encounter: Secondary | ICD-10-CM | POA: Diagnosis not present

## 2019-01-24 DIAGNOSIS — E119 Type 2 diabetes mellitus without complications: Secondary | ICD-10-CM | POA: Insufficient documentation

## 2019-01-24 DIAGNOSIS — Y929 Unspecified place or not applicable: Secondary | ICD-10-CM | POA: Insufficient documentation

## 2019-01-24 DIAGNOSIS — Z951 Presence of aortocoronary bypass graft: Secondary | ICD-10-CM | POA: Diagnosis not present

## 2019-01-24 DIAGNOSIS — Y999 Unspecified external cause status: Secondary | ICD-10-CM | POA: Diagnosis not present

## 2019-01-24 DIAGNOSIS — Y939 Activity, unspecified: Secondary | ICD-10-CM | POA: Insufficient documentation

## 2019-01-24 DIAGNOSIS — Z7982 Long term (current) use of aspirin: Secondary | ICD-10-CM | POA: Insufficient documentation

## 2019-01-24 DIAGNOSIS — S8992XA Unspecified injury of left lower leg, initial encounter: Secondary | ICD-10-CM | POA: Diagnosis not present

## 2019-01-24 DIAGNOSIS — I2510 Atherosclerotic heart disease of native coronary artery without angina pectoris: Secondary | ICD-10-CM | POA: Insufficient documentation

## 2019-01-24 DIAGNOSIS — I10 Essential (primary) hypertension: Secondary | ICD-10-CM | POA: Diagnosis not present

## 2019-01-24 NOTE — ED Triage Notes (Signed)
Patient arrived via POV c/o left knee pain 1 day post lawn mower rollover. Patient is able to ambulate with some stiffness. Patient able to flex and extend left knee with minor pain through full ROM. Interior left knee shows large bruising. Patient states he is on blood thinners. Patient is AO x 4, VS stable.

## 2019-01-24 NOTE — ED Provider Notes (Signed)
[facility] EMERGENCY DEPARTMENT Provider Note   CSN: [id number] Arrival date & time: 01/24/19  1953    History   Chief Complaint Chief Complaint  Patient presents with  . Knee Pain    HPI Larry [name] is a 78 y.o. male.     HPI Patient states he was driving a riding lawnmower when it flipped over.  States he struck his left knee.  He denies hitting his head or loss of consciousness.  States that since that time he has had increased swelling and bruising to the medial surface of the knee and some muscular strain to the lateral surface of the thigh.  No weakness or numbness.  Patient is able to ambulate.  Patient takes daily aspirin but no other blood thinner. Past Medical History:  Diagnosis Date  . Carotid stenosis, left   . Cataracts, bilateral    removed  . Coronary artery disease   . Hyperlipemia   . Prostate CA ([employer])   . Sleep apnea    Dr Ledora Bottcher ; uses CPAP  . Type II or unspecified type diabetes mellitus without mention of complication, not stated as uncontrolled   . Unspecified essential hypertension     Patient Active Problem List   Diagnosis Date Noted  . Morbid (severe) obesity with alveolar hypoventilation ([place]) 01/13/2019  . Cerebral embolism with cerebral infarction 11/20/2018  . Close Exposure to Covid-19 Virus 11/19/2018  . Acute respiratory failure with hypoxemia ([place]) 11/19/2018  . Sepsis ([place]) 11/19/2018  . Upper respiratory infection 09/22/2018  . Chronic combined systolic and diastolic heart failure ([place]) 08/25/2017  . Carotid stenosis 08/22/2017  . Coronary artery disease involving coronary bypass graft of native heart with angina pectoris ([place]) 06/27/2017  . DOE (dyspnea on exertion) 06/18/2017  . Acute encephalopathy 11/21/2016  . Sciatic radiculitis 03/23/2016  . OBSTRUCTIVE SLEEP APNEA 10/23/2010  . LOW BACK PAIN, ACUTE 07/19/2009  . DM2 (diabetes mellitus, type 2) ([place]) 06/24/2007  . Dyslipidemia 06/24/2007  . Essential  hypertension 06/24/2007  . PROSTATE CANCER, HX OF 06/24/2007    Past Surgical History:  Procedure Laterality Date  . APPENDECTOMY    . CARDIAC CATHETERIZATION    . CATARACT EXTRACTION, BILATERAL    . CORONARY ARTERY BYPASS GRAFT N/A 06/27/2017   Procedure: CORONARY ARTERY BYPASS GRAFTING (CABG), time four   using the left internal mammary artery, and right saphenous leg vein harvested endoscopically.;  Surgeon: Ivin Poot, MD;  Location: [facility];  Service: Open Heart Surgery;  Laterality: N/A;  . CYSTOSCOPY N/A 06/27/2017   Procedure: CYSTOSCOPY FLEXIBLE, Balloon dilatation placement of foley catheter;  Surgeon: Ivin Poot, MD;  Location: [facility];  Service: Open Heart Surgery;  Laterality: N/A;  . ENDARTERECTOMY Left 08/22/2017   Procedure: ENDARTERECTOMY CAROTID LEFT;  Surgeon: Serafina Mitchell, MD;  Location: [place] [facility];  Service: Vascular;  Laterality: Left;  . INTRAVASCULAR ULTRASOUND/IVUS N/A 06/24/2017   Procedure: Intravascular Ultrasound/IVUS;  Surgeon: Belva Crome, MD;  Location: [facility];  Service: Cardiovascular;  Laterality: N/A;  LEFT MAIN  . LEFT HEART CATH AND CORONARY ANGIOGRAPHY N/A 06/24/2017   Procedure: LEFT HEART CATH AND CORONARY ANGIOGRAPHY;  Surgeon: Belva Crome, MD;  Location: [facility];  Service: Cardiovascular;  Laterality: N/A;  . PATCH ANGIOPLASTY Left 08/22/2017   Procedure: PATCH ANGIOPLASTY USING Rueben Bash BIOLOGIC PATCH;  Surgeon: Serafina Mitchell, MD;  Location: [facility];  Service: Vascular;  Laterality: Left;  . PROSTATECTOMY    .  TEE WITHOUT CARDIOVERSION N/A 06/27/2017   Procedure: TRANSESOPHAGEAL ECHOCARDIOGRAM (TEE);  Surgeon: Prescott Gum, Collier Salina, MD;  Location: [facility];  Service: Open Heart Surgery;  Laterality: N/A;  . TONSILLECTOMY    . ULTRASOUND GUIDANCE FOR VASCULAR ACCESS  06/24/2017   Procedure: Ultrasound Guidance For Vascular Access;  Surgeon: Belva Crome, MD;  Location: [facility];  Service: Cardiovascular;;         Home Medications    Prior to Admission medications   Medication Sig Start Date End Date Taking? Authorizing Provider  ACCU-CHEK FASTCLIX LANCETS MISC 1 each by Does not apply route 2 (two) times daily. Dx:E11.9 11/26/17   Plotnikov, [name], MD  aspirin 325 MG tablet Take 1 tablet (325 mg total) by mouth daily. 11/24/18   Desiree Hane, MD  carvedilol (COREG) 25 MG tablet Take 1 tablet (25 mg total) by mouth 2 (two) times daily. 01/13/19 01/13/20  Plotnikov, [name], MD  Cholecalciferol (VITAMIN D3) 1000 UNITS CAPS Take 1,000 Units by mouth daily.     [provider]  folic acid (FOLVITE) 1 MG tablet Take 1 tablet (1 mg total) by mouth daily. 12/22/18   Plotnikov, [name], MD  furosemide (LASIX) 20 MG tablet Take 1 tablet (20 mg total) by mouth daily. Patient taking differently: Take 10 mg by mouth daily.  09/11/18 12/10/18  Belva Crome, MD  glimepiride (AMARYL) 2 MG tablet TAKE 1 TABLET BY MOUTH TWICE DAILY Patient taking differently: Take 2 mg by mouth 2 (two) times daily.  09/14/18   Plotnikov, [name], MD  glucose monitoring kit (FREESTYLE) monitoring kit 1 each by Does not apply route as needed for other. Please dispense brand of choice per ins/patient. Dx: 250.00. 05/08/13   Plotnikov, [name], MD  hydrALAZINE (APRESOLINE) 50 MG tablet Take 1 tablet (50 mg total) by mouth 3 (three) times daily. 01/13/19   Plotnikov, [name], MD  losartan (COZAAR) 100 MG tablet Take 1 tablet (100 mg total) by mouth daily. HOLD UNTIL SEEN BY YOUR PCP 11/24/18   [name] D, MD  metFORMIN (GLUCOPHAGE) 1000 MG tablet Take 1 tablet (1,000 mg total) by mouth 2 (two) times daily with a meal. 12/22/18   Plotnikov, [name], MD  Multiple Vitamins-Minerals (MACULAR VITAMIN BENEFIT) TABS Take 1 tablet by mouth 2 (two) times daily.    [provider]  nitroGLYCERIN (NITROSTAT) 0.4 MG SL tablet Place 1 tablet (0.4 mg total) under the tongue every 5 (five) minutes as needed for chest pain.  Patient not taking: Reported on 12/22/2018 06/18/17   Plotnikov, [name], MD  simvastatin (ZOCOR) 40 MG tablet Take 1 tablet (40 mg total) by mouth every evening. 12/22/18   Plotnikov, [name], MD  thiamine 100 MG tablet Take 1 tablet (100 mg total) by mouth daily. 12/22/18   Plotnikov, [name], MD    Family History Family History  Problem Relation Age of Onset  . Heart disease Mother   . Diabetes Other   . Hypertension Other   . Colon cancer Neg Hx     Social History Social History   Tobacco Use  . Smoking status: Never Smoker  . Smokeless tobacco: Never Used  Substance Use Topics  . Alcohol use: No    Alcohol/week: 0.0 standard drinks  . Drug use: No     Allergies   Amlodipine; Verapamil; Clindamycin hcl; and Contrast media [iodinated diagnostic agents]   Review of Systems Review of Systems  Constitutional: Negative  for chills and fever.  HENT: Negative for facial swelling.   Respiratory: Negative for shortness of breath.   Cardiovascular: Negative for chest pain.  Gastrointestinal: Negative for abdominal pain, diarrhea, nausea and vomiting.  Musculoskeletal: Positive for arthralgias, joint swelling and myalgias. Negative for back pain, neck pain and neck stiffness.  Skin: Negative for rash and wound.  Neurological: Negative for dizziness, weakness, light-headedness, numbness and headaches.  All other systems reviewed and are negative.    Physical Exam Updated Vital Signs BP (!) 160/57 (BP Location: Left Arm)   Pulse 72   Temp 98.3 F (36.8 C) (Oral)   Resp 18   Ht '6\' 1"'$  (1.854 m)   Wt 108.9 kg   SpO2 98%   BMI 31.66 kg/m   Physical Exam Vitals signs and nursing note reviewed.  Constitutional:      General: He is not in acute distress.    Appearance: Normal appearance. He is well-developed. He is not ill-appearing.  HENT:     Head: Normocephalic and atraumatic.  Eyes:     Pupils: Pupils are equal, round, and reactive to light.  Neck:      Musculoskeletal: Normal range of motion and neck supple. No muscular tenderness.     Comments: No posterior midline cervical tenderness to palpation. Cardiovascular:     Rate and Rhythm: Normal rate and regular rhythm.     Heart sounds: No murmur. No friction rub. No gallop.   Pulmonary:     Effort: Pulmonary effort is normal. No respiratory distress.     Breath sounds: Normal breath sounds. No wheezing or rales.  Abdominal:     General: Bowel sounds are normal. There is no distension.     Palpations: Abdomen is soft.     Tenderness: There is no abdominal tenderness. There is no guarding.  Musculoskeletal: Normal range of motion.        General: Swelling and tenderness present. No deformity.     Right lower leg: No edema.     Left lower leg: No edema.     Comments: Patient with mild anterior left knee tenderness to palpation.  There is obvious bruising over the medial surface of the left knee.  No obvious effusion.  Patient has full range of motion of the left knee without ligamentous instability.  Mild tenderness palpation over the left lateral thigh.  No obvious injury.  Pelvis is stable.  Distal pulses intact.  Lymphadenopathy:     Cervical: No cervical adenopathy.  Skin:    General: Skin is warm and dry.     Findings: No erythema or rash.  Neurological:     General: No focal deficit present.     Mental Status: He is alert and oriented to person, place, and time.  Psychiatric:        Behavior: Behavior normal.      ED Treatments / Results  Labs (all labs ordered are listed, but only abnormal results are displayed) Labs Reviewed - No data to display  EKG None  Radiology Dg Knee Complete 4 Views Left  Result Date: 01/24/2019 CLINICAL DATA:  78 year old male with history of trauma after falling off a writing lawnmower. Left knee pain. EXAM: LEFT KNEE - COMPLETE 4+ VIEW COMPARISON:  No priors. FINDINGS: No evidence of fracture, dislocation, or joint effusion. No evidence of  arthropathy or other focal bone abnormality. Soft tissues are unremarkable. IMPRESSION: Negative. Electronically Signed   By: Vinnie Langton M.D.   On: 01/24/2019 20:27  Procedures Procedures (including critical care time)  Medications Ordered in ED Medications - No data to display   Initial Impression / Assessment and Plan / ED Course  I have reviewed the triage vital signs and the nursing notes.  Pertinent labs & imaging results that were available during my care of the patient were reviewed by me and considered in my medical decision making (see chart for details).        No evidence of bony injury on x-ray.  Advised rice treatment and follow-up with sports medicine if symptoms persist.  Final Clinical Impressions(s) / ED Diagnoses   Final diagnoses:  Contusion of left knee, initial encounter    ED Discharge Orders    None       Julianne Rice, MD 01/24/19 2035

## 2019-01-28 ENCOUNTER — Ambulatory Visit (INDEPENDENT_AMBULATORY_CARE_PROVIDER_SITE_OTHER): Payer: Medicare Other | Admitting: Internal Medicine

## 2019-01-28 ENCOUNTER — Telehealth: Payer: Self-pay

## 2019-01-28 ENCOUNTER — Encounter: Payer: Self-pay | Admitting: Internal Medicine

## 2019-01-28 ENCOUNTER — Other Ambulatory Visit (INDEPENDENT_AMBULATORY_CARE_PROVIDER_SITE_OTHER): Payer: Medicare Other

## 2019-01-28 ENCOUNTER — Other Ambulatory Visit: Payer: Self-pay

## 2019-01-28 VITALS — BP 144/72 | HR 75 | Temp 97.8°F | Ht 73.0 in | Wt 257.0 lb

## 2019-01-28 DIAGNOSIS — T148XXA Other injury of unspecified body region, initial encounter: Secondary | ICD-10-CM

## 2019-01-28 DIAGNOSIS — E1159 Type 2 diabetes mellitus with other circulatory complications: Secondary | ICD-10-CM

## 2019-01-28 DIAGNOSIS — E118 Type 2 diabetes mellitus with unspecified complications: Secondary | ICD-10-CM | POA: Diagnosis not present

## 2019-01-28 DIAGNOSIS — I639 Cerebral infarction, unspecified: Secondary | ICD-10-CM

## 2019-01-28 DIAGNOSIS — R600 Localized edema: Secondary | ICD-10-CM | POA: Diagnosis not present

## 2019-01-28 LAB — CBC
HCT: 32.3 % — ABNORMAL LOW (ref 39.0–52.0)
Hemoglobin: 10.7 g/dL — ABNORMAL LOW (ref 13.0–17.0)
MCHC: 33 g/dL (ref 30.0–36.0)
MCV: 91.3 fl (ref 78.0–100.0)
Platelets: 239 10*3/uL (ref 150.0–400.0)
RBC: 3.53 Mil/uL — ABNORMAL LOW (ref 4.22–5.81)
RDW: 13.3 % (ref 11.5–15.5)
WBC: 10.4 10*3/uL (ref 4.0–10.5)

## 2019-01-28 LAB — COMPREHENSIVE METABOLIC PANEL
ALT: 12 U/L (ref 0–53)
AST: 13 U/L (ref 0–37)
Albumin: 3.9 g/dL (ref 3.5–5.2)
Alkaline Phosphatase: 55 U/L (ref 39–117)
BUN: 30 mg/dL — ABNORMAL HIGH (ref 6–23)
CO2: 27 mEq/L (ref 19–32)
Calcium: 9.1 mg/dL (ref 8.4–10.5)
Chloride: 106 mEq/L (ref 96–112)
Creatinine, Ser: 1.27 mg/dL (ref 0.40–1.50)
GFR: 54.96 mL/min — ABNORMAL LOW (ref >60.00)
Glucose, Bld: 74 mg/dL (ref 70–99)
Potassium: 4.4 mEq/L (ref 3.5–5.1)
Sodium: 141 mEq/L (ref 135–145)
Total Bilirubin: 0.6 mg/dL (ref 0.2–1.2)
Total Protein: 6.4 g/dL (ref 6.0–8.3)

## 2019-01-28 LAB — HEMOGLOBIN A1C: Hgb A1c MFr Bld: 6.7 % — ABNORMAL HIGH (ref 4.6–6.5)

## 2019-01-28 NOTE — Assessment & Plan Note (Signed)
On Metformin, Glimepiride

## 2019-01-28 NOTE — Patient Instructions (Addendum)
Hematoma A hematoma is a collection of blood. A hematoma can happen:  Under the skin.  In an organ.  In a body space.  In a joint space.  In other tissues. The blood can thicken (clot) to form a lump that you can see and feel. The lump is often hard and may become sore and tender. The lump can be very small or very big. Most hematomas get better in a few days to weeks. However, some hematomas may be serious and need medical care. What are the causes? This condition is caused by:  An injury.  Blood that leaks under the skin.  Problems from surgeries.  Medical conditions that cause bleeding or bruising. What increases the risk? You are more likely to develop this condition if:  You are an older adult.  You use medicines that thin your blood. What are the signs or symptoms? Symptoms depend on where the hematoma is in your body.  If the hematoma is under the skin, there is: ? A firm lump on the body. ? Pain and tenderness in the area. ? Bruising. The skin above the lump may be blue, dark blue, purple-red, or yellowish.  If the hematoma is deep in the tissues or body spaces, there may be: ? Blood in the stomach. This may cause pain in the belly (abdomen), weakness, passing out (fainting), and shortness of breath. ? Blood in the head. This may cause a headache, weakness, trouble speaking or understanding speech, or passing out. How is this diagnosed? This condition is diagnosed based on:  Your medical history.  A physical exam.  Imaging tests, such as ultrasound or CT scan.  Blood tests. How is this treated? Treatment depends on the cause, size, and location of the hematoma. Treatment may include:  Doing nothing. Many hematomas go away on their own without treatment.  Surgery or close monitoring. This may be needed for large hematomas or hematomas that affect the body's organs.  Medicines. These may be given if a medical condition caused the hematoma. Follow these  instructions at home: Managing pain, stiffness, and swelling   If told, put ice on the area. ? Put ice in a plastic bag. ? Place a towel between your skin and the bag. ? Leave the ice on for 20 minutes, 2-3 times a day for the first two days.  If told, put heat on the affected area after putting ice on the area for two days. Use the heat source that your doctor tells you to use. This could be a moist heat pack or a heating pad. To do this: ? Place a towel between your skin and the heat source. ? Leave the heat on for 20-30 minutes. ? Remove the heat if your skin turns bright red. This is very important if you are unable to feel pain, heat, or cold. You may have a greater risk of getting burned.  Raise (elevate) the affected area above the level of your heart while you are sitting or lying down.  Wrap the affected area with an elastic bandage, if told by your doctor. Do not wrap the bandage too tightly.  If your hematoma is on a leg or foot and is painful, your doctor may give you crutches. Use them as told by your doctor. General instructions  Take over-the-counter and prescription medicines only as told by your doctor.  Keep all follow-up visits as told by your doctor. This is important. Contact a doctor if:  You have a fever.    The swelling or bruising gets worse.  You start to get more hematomas. Get help right away if:  Your pain gets worse.  Your pain is not getting better with medicine.  Your skin over the hematoma breaks or starts to bleed.  Your hematoma is in your chest or belly and you: ? Pass out. ? Feel weak. ? Become short of breath.  You have a hematoma on your scalp that is caused by a fall or injury, and you: ? Have a headache that gets worse. ? Have trouble speaking or understanding speech. ? Become less alert or you pass out. Summary  A hematoma is a collection of blood in any part of your body.  Most hematomas get better on their own in a few days  to weeks. Some may need medical care.  Follow instructions from your doctor about how to care for your hematoma.  Contact a doctor if the swelling or bruising gets worse, or if you are short of breath. This information is not intended to replace advice given to you by your health care provider. Make sure you discuss any questions you have with your health care provider. Document Released: 09/13/2004 Document Revised: 01/09/2018 Document Reviewed: 01/09/2018 Elsevier Interactive Patient Education  2019 [place]:  Arnica is an herb that grows mainly in [place] and central [place], as well as temperate climates in [place]. The flowers of the plant are used in medicine.  Arnica is most commonly used for pain caused by osteoarthritis, sore throat, surgery, and other conditions. Arnica is also used for bleeding, bruising, swelling after surgery, and other conditions, but there is no good scientific evidence to support these uses. Arnica can also be unsafe when taken by mouth.   In foods, arnica is a flavor ingredient in beverages, frozen dairy desserts, candy, baked goods, gelatins, and puddings.  In manufacturing, arnica is used in hair tonics and anti-dandruff preparations. The oil is used in perfumes and cosmetics.    "The Obesity Code" by Sharman Cheek

## 2019-01-28 NOTE — Assessment & Plan Note (Signed)
Worse Compression socks Labs

## 2019-01-28 NOTE — Telephone Encounter (Signed)
IF pt calls back he is high risk factors will have to be video visit.   Left vm for patient that appt will be change to virtual video visit due to COVID 19. I stated to please call back for further instructions. Pt needs to give verbal consent to do video and to file insurance. Need to know where to send link for video visit.

## 2019-01-28 NOTE — Progress Notes (Signed)
Subjective:  Patient ID: Larry [name], male    DOB: [birth year]  Age: 78 y.o. MRN: [id number]  CC: No chief complaint on file.   HPI Larry [name] presents for LLE injury - riding mower rolled over Pt went to ER on 6/6, had L knee X rays - ok F/u LE edema, DM  Outpatient Medications Prior to Visit  Medication Sig Dispense Refill  . ACCU-CHEK FASTCLIX LANCETS MISC 1 each by Does not apply route 2 (two) times daily. Dx:E11.9 100 each 11  . aspirin 325 MG tablet Take 1 tablet (325 mg total) by mouth daily. 60 tablet 0  . carvedilol (COREG) 25 MG tablet Take 1 tablet (25 mg total) by mouth 2 (two) times daily. 180 tablet 3  . Cholecalciferol (VITAMIN D3) 1000 UNITS CAPS Take 1,000 Units by mouth daily.     . folic acid (FOLVITE) 1 MG tablet Take 1 tablet (1 mg total) by mouth daily. 30 tablet 3  . glimepiride (AMARYL) 2 MG tablet TAKE 1 TABLET BY MOUTH TWICE DAILY (Patient taking differently: Take 2 mg by mouth 2 (two) times daily. ) 180 tablet 3  . glucose monitoring kit (FREESTYLE) monitoring kit 1 each by Does not apply route as needed for other. Please dispense brand of choice per ins/patient. Dx: 250.00. 1 each 0  . hydrALAZINE (APRESOLINE) 50 MG tablet Take 1 tablet (50 mg total) by mouth 3 (three) times daily. 270 tablet 3  . losartan (COZAAR) 100 MG tablet Take 1 tablet (100 mg total) by mouth daily. HOLD UNTIL SEEN BY YOUR PCP 90 tablet 3  . metFORMIN (GLUCOPHAGE) 1000 MG tablet Take 1 tablet (1,000 mg total) by mouth 2 (two) times daily with a meal. 60 tablet 11  . Multiple Vitamins-Minerals (MACULAR VITAMIN BENEFIT) TABS Take 1 tablet by mouth 2 (two) times daily.    . nitroGLYCERIN (NITROSTAT) 0.4 MG SL tablet Place 1 tablet (0.4 mg total) under the tongue every 5 (five) minutes as needed for chest pain. 20 tablet 3  . simvastatin (ZOCOR) 40 MG tablet Take 1 tablet (40 mg total) by mouth every evening. 30 tablet 11  . thiamine 100 MG tablet Take 1 tablet (100 mg total) by  mouth daily. 30 tablet 5  . furosemide (LASIX) 20 MG tablet Take 1 tablet (20 mg total) by mouth daily. (Patient taking differently: Take 10 mg by mouth daily. ) 90 tablet 3   No facility-administered medications prior to visit.     ROS: Review of Systems  Constitutional: Negative for appetite change, fatigue and unexpected weight change.  HENT: Negative for congestion, nosebleeds, sneezing, sore throat and trouble swallowing.   Eyes: Negative for itching and visual disturbance.  Respiratory: Negative for cough.   Cardiovascular: Positive for leg swelling. Negative for chest pain and palpitations.  Gastrointestinal: Negative for abdominal distention, blood in stool, diarrhea and nausea.  Genitourinary: Negative for frequency and hematuria.  Musculoskeletal: Negative for back pain, gait problem, joint swelling and neck pain.  Skin: Negative for rash.  Neurological: Negative for dizziness, tremors, speech difficulty and weakness.  Psychiatric/Behavioral: Negative for agitation, dysphoric mood, sleep disturbance and suicidal ideas. The patient is not nervous/anxious.     Objective:  BP (!) 144/72 (BP Location: Right Arm, Patient Position: Sitting, Cuff Size: Large)   Pulse 75   Temp 97.8 F (36.6 C) (Oral)   Ht _0  (1.854 m)   Wt 257 lb (116.6 kg)   SpO2 96%   BMI 33.91  kg/m   BP Readings from Last 3 Encounters:  01/28/19 (!) 144/72  01/24/19 (!) 160/57  12/22/18 130/70    Wt Readings from Last 3 Encounters:  01/28/19 257 lb (116.6 kg)  01/24/19 240 lb (108.9 kg)  12/22/18 247 lb 4 oz (112.2 kg)    Physical Exam Constitutional:      General: He is not in acute distress.    Appearance: He is well-developed.     Comments: NAD  Eyes:     Conjunctiva/sclera: Conjunctivae normal.     Pupils: Pupils are equal, round, and reactive to light.  Neck:     Musculoskeletal: Normal range of motion.     Thyroid: No thyromegaly.     Vascular: No JVD.  Cardiovascular:     Rate  and Rhythm: Normal rate and regular rhythm.     Heart sounds: Normal heart sounds. No murmur. No friction rub. No gallop.   Pulmonary:     Effort: Pulmonary effort is normal. No respiratory distress.     Breath sounds: Normal breath sounds. No wheezing or rales.  Chest:     Chest wall: No tenderness.  Abdominal:     General: Bowel sounds are normal. There is no distension.     Palpations: Abdomen is soft. There is no mass.     Tenderness: There is no abdominal tenderness. There is no guarding or rebound.  Musculoskeletal: Normal range of motion.        General: No tenderness.  Lymphadenopathy:     Cervical: No cervical adenopathy.  Skin:    General: Skin is warm and dry.     Findings: Bruising present. No rash.  Neurological:     Mental Status: He is alert and oriented to person, place, and time.     Cranial Nerves: No cranial nerve deficit.     Motor: No abnormal muscle tone.     Coordination: Coordination abnormal.     Gait: Gait abnormal.     Deep Tendon Reflexes: Reflexes are normal and symmetric.  Psychiatric:        Behavior: Behavior normal.        Thought Content: Thought content normal.        Judgment: Judgment normal.    Limp LLE w/hematoma, bruising - distal thigh, post knee and proximal calf  Cane 2+ B edema   Lab Results  Component Value Date   WBC 13.9 (H) 11/23/2018   HGB 13.5 11/23/2018   HCT 43.1 11/23/2018   PLT 232 11/23/2018   GLUCOSE 118 (H) 12/22/2018   CHOL 129 11/20/2018   TRIG 113 11/20/2018   HDL 38 (L) 11/20/2018   LDLCALC 68 11/20/2018   ALT 13 12/22/2018   AST 13 12/22/2018   NA 141 12/22/2018   K 4.3 12/22/2018   CL 103 12/22/2018   CREATININE 1.30 12/22/2018   BUN 19 12/22/2018   CO2 29 12/22/2018   TSH 2.19 03/25/2018   PSA 0.00 Repeated and verified X2. (L) 12/26/2012   INR 1.0 11/19/2018   HGBA1C 6.8 (H) 12/22/2018    Dg Knee Complete 4 Views Left  Result Date: 01/24/2019 CLINICAL DATA:  78 year old male with history of  trauma after falling off a writing lawnmower. Left knee pain. EXAM: LEFT KNEE - COMPLETE 4+ VIEW COMPARISON:  No priors. FINDINGS: No evidence of fracture, dislocation, or joint effusion. No evidence of arthropathy or other focal bone abnormality. Soft tissues are unremarkable. IMPRESSION: Negative. Electronically Signed   By: Mauri Brooklyn.D.  On: 01/24/2019 20:27    Assessment & Plan:   There are no diagnoses linked to this encounter.   No orders of the defined types were placed in this encounter.    Follow-up: No follow-ups on file.  Walker Kehr, MD

## 2019-01-29 NOTE — Telephone Encounter (Signed)
01-29-19 Pt called, he gave verbal consent to file insurance for [email or web address] vv cellphone # and carrier confirmed   Pt understands that although there may be some limitations with this type of visit, we will take all precautions to reduce any security or privacy concerns.  Pt understands that this will be treated like an in office visit and we will file with pt's insurance, and there may be a patient responsible charge related to this service. Cellphone [phone] [employer] pt confirmed receiving the text link

## 2019-02-03 ENCOUNTER — Other Ambulatory Visit: Payer: Self-pay

## 2019-02-03 ENCOUNTER — Telehealth: Payer: Self-pay | Admitting: Neurology

## 2019-02-03 ENCOUNTER — Encounter: Payer: Self-pay | Admitting: Neurology

## 2019-02-03 ENCOUNTER — Ambulatory Visit (INDEPENDENT_AMBULATORY_CARE_PROVIDER_SITE_OTHER): Payer: Medicare Other | Admitting: Neurology

## 2019-02-03 DIAGNOSIS — I639 Cerebral infarction, unspecified: Secondary | ICD-10-CM

## 2019-02-03 DIAGNOSIS — I6389 Other cerebral infarction: Secondary | ICD-10-CM

## 2019-02-03 NOTE — Telephone Encounter (Signed)
Medicare/bcbs supp order sent to GI. No auth they will reach out to the patient to schedule.  

## 2019-02-03 NOTE — Progress Notes (Signed)
Virtual Visit via Video Note  I connected with Larry [name] on 02/03/19 at  9:00 AM EDT by a video enabled telemedicine application and verified that I am speaking with the correct person using two identifiers.  Location: Patient: at home with his wife Provider: at [facility] office   I discussed the limitations of evaluation and management by telemedicine and the availability of in person appointments. The patient expressed understanding and agreed to proceed.  This visit was performed using [email or web address] app for audio and visual.  History of Present Illness: Larry [name] is seen today for virtual video follow-up visit following hospital admission for possible stroke in April 2020.  History is obtained from the patient and his wife and review of electronic medical records.  I personally reviewed imaging films in PACS.  The patient was in his normal state of health till 8 PM on the night prior to admission.  He started complaining that he is feeling better and needed to go to bed.  Over the course of the night he developed severe nausea and frequent bouts of vomiting.  His wife noticed that when he tried to speak he is was speaking gibberish and did not make much sense.  He initially resisted admission but subsequently wife brought him to the hospital.  EMS called a code stroke in route due to aphasia and right-sided weakness.  After arrival to the emergency department developed progressive hypoxia and tachycardia and chest x-ray showed CHF pattern.  TPA was not given as he presented outside time window.  Patient was intubated for respiratory failure.  CT head showed old right basal ganglia lacune.  CT angiogram showed no large vessel occlusion.  There was moderate bilateral ICA stenosis with mild atherosclerotic changes and right vertebral artery, anterior, middle cerebral and posterior cerebral arteries.  CT angiogram of the neck showed evidence of old left carotid endarterectomy with 60% stenosis.  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  CT perfusion scan showed a 4 cc left occipital penumbra without any cord infarct.  Spinal tap showed 27 red cells, 2 white cells, normal glucose and protein was minimally elevated at 52 mg percent.  Urine drug screen was negative.  LDL cholesterol 67 mg percent.  Hemoglobin A1c was 6.5.  MRI scan was planned but could not be obtained due to restrictions due to the ongoing COVID pandemic.  Patient's COVID test was negative.  Patient's mental status improved significantly he was extubated.  He was back to his baseline soon with no neurological deficits.  He was started on aspirin and continued on his statin and diabetic and blood pressure medications.   Patient states is done well since discharge.  He has had no further episodes of speech difficulties or other strokelike symptoms.  He is tolerating aspirin well without any side effects.  States his blood pressure sugar and cholesterol all are under good control.  He has no new complaints today. Observations/Objective: Pleasant elderly mildly obese Caucasian male not in distress.  Physical and neurological exams are limited secondary to constraints from virtual video visit.  He is awake alert oriented to time place and person.  Speech and language appear normal.  He is able to name repeat and comprehend quite well.  Recall is 3/3.  He is able to name 10 animals which walk on 4 legs.  Extraocular movements are full range without nystagmus.  Face is symmetric without weakness.  Tongue is midline.  Motor system  exam shows symmetric upper and lower extremity strength.  He is able to stand on either foot unsupported and walks with a fairly steady gait.  Assessment and Plan: 78 year old gentleman with transient episode of nausea vomiting and expressive speech difficulties with questionable right-sided weakness in April 2020 possibly brainstem infarct not visualized on CT scan.  MRI not done.   Vascular risk factors of diabetes, hypertension, hyperlipidemia, obesity and carotid disease and intra-and extracranial atherosclerosis Plan : I recommend continue aspirin for stroke prevention with aggressive risk factor modification with strict control of hypertension with blood pressure goal below 130/90, diabetes with hemoglobin A1c goal below 6.5% and lipids with LDL cholesterol goal below 70 mg percent.  I encouraged the patient to eat a healthy diet with lots of fruits, vegetables, cereals and whole grains and to exercise regularly for 30 minutes and to lose weight.  Follow Up Instructions:  Check MRI scan of the brain and EEG as they could not be done in the hospital.  Return for follow-up in 3 months in person or call earlier if necessary  I discussed the assessment and treatment plan with the patient. The patient was provided an opportunity to ask questions and all were answered. The patient agreed with the plan and demonstrated an understanding of the instructions.   The patient was advised to call back or seek an in-person evaluation if the symptoms worsen or if the condition fails to improve as anticipated.  I provided 25 minutes of non-face-to-face time during this encounter.   Antony Contras, MD

## 2019-02-04 ENCOUNTER — Ambulatory Visit: Payer: Medicare Other | Admitting: Internal Medicine

## 2019-02-16 ENCOUNTER — Other Ambulatory Visit: Payer: Self-pay

## 2019-02-16 ENCOUNTER — Ambulatory Visit (INDEPENDENT_AMBULATORY_CARE_PROVIDER_SITE_OTHER): Payer: Medicare Other | Admitting: Neurology

## 2019-02-16 DIAGNOSIS — I6389 Other cerebral infarction: Secondary | ICD-10-CM

## 2019-02-16 DIAGNOSIS — R4701 Aphasia: Secondary | ICD-10-CM

## 2019-02-23 ENCOUNTER — Telehealth: Payer: Self-pay | Admitting: Neurology

## 2019-02-23 NOTE — Telephone Encounter (Signed)
Called patient and LVM to schedule a 3 month follow-up from his 6/16 appointment requested by Dr. Leonie Man. Requested patient call back. Office contact info provided.

## 2019-03-10 ENCOUNTER — Telehealth: Payer: Self-pay

## 2019-03-10 NOTE — Telephone Encounter (Signed)
-----   Message from Garvin Fila, MD sent at 03/06/2019  3:06 PM EDT ----- Larry [name] inform the patient that EEG study was normal and there was no evidence of seizure activity.

## 2019-03-10 NOTE — Telephone Encounter (Signed)
Notes recorded by Marval Regal, RN on 03/10/2019 at 2:55 PM EDT  I called pts wife on emergency contact list and stated the EEG was normal. There was no evidence of seizure activity. The wife verbalized understanding.  ------

## 2019-03-14 ENCOUNTER — Ambulatory Visit
Admission: RE | Admit: 2019-03-14 | Discharge: 2019-03-14 | Disposition: A | Discharge status: Home / Self Care | Payer: Medicare Other | Source: Ambulatory Visit | Attending: Neurology | Admitting: Neurology

## 2019-03-14 ENCOUNTER — Other Ambulatory Visit: Payer: Self-pay

## 2019-03-14 DIAGNOSIS — I6389 Other cerebral infarction: Secondary | ICD-10-CM | POA: Diagnosis not present

## 2019-03-14 MED ORDER — GADOBENATE DIMEGLUMINE 529 MG/ML IV SOLN
20.0000 mL | Freq: Once | INTRAVENOUS | Status: AC | PRN
  Administered 2019-03-14: 20 mL via INTRAVENOUS

## 2019-03-17 ENCOUNTER — Telehealth: Payer: Self-pay

## 2019-03-17 NOTE — Telephone Encounter (Signed)
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 There were also mild changes of hardening of the arteries and shrinkage of the brain which were age compatible. No new or worrisome finding was seen. The pt verbalized understanding.

## 2019-03-17 NOTE — Telephone Encounter (Signed)
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  There were also mild changes of hardening of the arteries and shrinkage of the brain which were age compatible.  No new or worrisome finding was seen.

## 2019-03-24 ENCOUNTER — Ambulatory Visit (INDEPENDENT_AMBULATORY_CARE_PROVIDER_SITE_OTHER): Payer: Medicare Other | Admitting: Internal Medicine

## 2019-03-24 ENCOUNTER — Other Ambulatory Visit: Payer: Self-pay

## 2019-03-24 ENCOUNTER — Encounter: Payer: Self-pay | Admitting: Internal Medicine

## 2019-03-24 DIAGNOSIS — R600 Localized edema: Secondary | ICD-10-CM

## 2019-03-24 DIAGNOSIS — E1159 Type 2 diabetes mellitus with other circulatory complications: Secondary | ICD-10-CM | POA: Diagnosis not present

## 2019-03-24 DIAGNOSIS — I6310 Cerebral infarction due to embolism of unspecified precerebral artery: Secondary | ICD-10-CM | POA: Diagnosis not present

## 2019-03-24 DIAGNOSIS — N393 Stress incontinence (female) (male): Secondary | ICD-10-CM | POA: Diagnosis not present

## 2019-03-24 DIAGNOSIS — M25552 Pain in left hip: Secondary | ICD-10-CM | POA: Diagnosis not present

## 2019-03-24 DIAGNOSIS — I639 Cerebral infarction, unspecified: Secondary | ICD-10-CM

## 2019-03-24 DIAGNOSIS — Z8546 Personal history of malignant neoplasm of prostate: Secondary | ICD-10-CM | POA: Diagnosis not present

## 2019-03-24 DIAGNOSIS — N5201 Erectile dysfunction due to arterial insufficiency: Secondary | ICD-10-CM | POA: Diagnosis not present

## 2019-03-24 MED ORDER — THIAMINE HCL 100 MG PO TABS: 100.0000 mg | ORAL_TABLET | Freq: Every day | ORAL | 3 refills | Status: DC

## 2019-03-24 MED ORDER — METFORMIN HCL 1000 MG PO TABS: 1000.0000 mg | ORAL_TABLET | Freq: Two times a day (BID) | ORAL | 3 refills | Status: DC

## 2019-03-24 MED ORDER — SIMVASTATIN 40 MG PO TABS: 40.0000 mg | ORAL_TABLET | Freq: Every evening | ORAL | 3 refills | Status: DC

## 2019-03-24 NOTE — Assessment & Plan Note (Signed)
Chronic Wt loss Diet

## 2019-03-24 NOTE — Patient Instructions (Signed)
These suggestions will probably help you to improve your metabolism if you are not overweight and to lose weight if you are overweight: 1.  Reduce your consumption of sugars and starches.  Eliminate high fructose corn syrup from your diet.  Reduce your consumption of processed foods.  For desserts try to have seasonal fruits, berries, nuts, cheeses or dark chocolate with more than 70% cacao. 2.  Do not snack 3.  You do not have to eat breakfast.  If you choose to have breakfast-eat plain greek yogurt, eggs, oatmeal (without sugar) 4.  Drink water, freshly brewed unsweetened tea (green, black or herbal) or coffee.  Do not drink sodas including diet sodas , juices, beverages sweetened with artificial sweeteners. 5.  Reduce your consumption of refined grains. 6.  Avoid protein drinks such as Optifast, Slim fast etc. Eat chicken, fish, meat, dairy and beans for your sources of protein 7.  Natural unprocessed fats like cold pressed virgin olive oil, butter, coconut oil are good for you.  Eat avocados 8.  Increase your consumption of fiber.  Fruits, berries, vegetables, whole grains, flaxseeds, Chia seeds, beans, popcorn, nuts, oatmeal are good sources of fiber 9.  Use vinegar in your diet, i.e. apple cider vinegar, red wine or balsamic vinegar 10.  You can try fasting.  For example you can skip breakfast and lunch every other day (24-hour fast) 11.  Stress reduction, good night sleep, relaxation, meditation, yoga and other physical activity is likely to help you to maintain low weight too. 12.  If you drink alcohol, limit your alcohol intake to no more than 2 drinks a day.   Mediterranean diet is good for you. ([employer] has a typical Mediterranean cuisine menu) The Mediterranean diet is a way of eating based on the traditional cuisine of countries bordering the [place] Sea. While there is no single definition of the Mediterranean diet, it is typically high in vegetables, fruits, whole grains,  beans, nut and seeds, and olive oil. The main components of Mediterranean diet include: . Daily consumption of vegetables, fruits, whole grains and healthy fats  . Weekly intake of fish, poultry, beans and eggs  . Moderate portions of dairy products  . Limited intake of red meat Other important elements of the Mediterranean diet are sharing meals with family and friends, enjoying a glass of red wine and being physically active. Health benefits of a Mediterranean diet: A traditional Mediterranean diet consisting of large quantities of fresh fruits and vegetables, nuts, fish and olive oil-coupled with physical activity-can reduce your risk of serious mental and physical health problems by: Preventing heart disease and strokes. Following a Mediterranean diet limits your intake of refined breads, processed foods, and red meat, and encourages drinking red wine instead of hard liquor-all factors that can help prevent heart disease and stroke. Keeping you agile. If you're an older adult, the nutrients gained with a Mediterranean diet may reduce your risk of developing muscle weakness and other signs of frailty by about 70 percent. Reducing the risk of Alzheimer's. Research suggests that the Mediterranean diet may improve cholesterol, blood sugar levels, and overall blood vessel health, which in turn may reduce your risk of Alzheimer's disease or dementia. Halving the risk of Parkinson's disease. The high levels of antioxidants in the Mediterranean diet can prevent cells from undergoing a damaging process called oxidative stress, thereby cutting the risk of Parkinson's disease in half. Increasing longevity. By reducing your risk of developing heart disease or cancer with the Mediterranean diet,   you're reducing your risk of death at any age by 20%. Protecting against type 2 diabetes. A Mediterranean diet is rich in fiber which digests slowly, prevents huge swings in blood sugar, and can help you maintain a  healthy weight.    Cabbage soup recipe that will not make you gain weight: Take 1 small head of cabbage, 1 average pack of celery, 4 green peppers, 4 onions, 2 cans diced tomatoes (they are not available without salt), salt and spices to taste.  Chop cabbage, celery, peppers and onions.  And tomatoes and 2-2.5 liters (2.5 quarts) of water so that it would just cover the vegetables.  Bring to boil.  Add spices and salt.  Turn heat to low/medium and simmer for 20-25 minutes.  Naturally, you can make a smaller batch and change some of the ingredients.  Hip Bursitis  Hip bursitis is swelling of a fluid-filled sac (bursa) in your hip joint. This swelling (inflammation) can be painful. This condition may come and go over time. What are the causes?  Injury to the hip.  Overuse of the muscles that surround the hip joint.  An earlier injury or surgery of the hip.  Arthritis or gout.  Diabetes.  Thyroid disease.  Infection.  In some cases, the cause may not be known. What are the signs or symptoms?  Mild or moderate pain in the hip area. Pain may get worse with movement.  Tenderness and swelling of the hip, especially on the outer side of the hip.  In rare cases, the bursa may become infected. This may cause: ? A fever. ? Warmth and redness in the area. Symptoms may come and go. How is this treated? This condition is treated by resting, icing, applying pressure (compression), and raising (elevating) the injured area. You may hear this called the RICE treatment. Treatment may also include:  Using crutches.  Draining fluid out of the bursa to help relieve swelling.  Giving a shot of (injecting) medicine that helps to reduce swelling (cortisone).  Other medicines if the bursa is infected. Follow these instructions at home: Managing pain, stiffness, and swelling   If told, put ice on the painful area. ? Put ice in a plastic bag. ? Place a towel between your skin and the bag.  ? Leave the ice on for 20 minutes, 2-3 times a day. ? Raise (elevate) your hip above the level of your heart as much as you can without pain. To do this, try putting a pillow under your hips while you lie down. Stop if this causes pain. Activity  Return to your normal activities as told by your doctor. Ask your doctor what activities are safe for you.  Rest and protect your hip as much as you can until you feel better. General instructions  Take over-the-counter and prescription medicines only as told by your doctor.  Wear wraps that put pressure on your hip (compression wraps) only as told by your doctor.  Do not use your hip to support your body weight until your doctor says that you can.  Use crutches as told by your doctor.  Gently rub and stretch your injured area as often as is comfortable.  Keep all follow-up visits as told by your doctor. This is important. How is this prevented?  Exercise regularly, as told by your doctor.  Warm up and stretch before being active.  Cool down and stretch after being active.  Avoid activities that bother your hip or cause pain.  Avoid sitting  down for long periods at a time. Contact a doctor if:  You have a fever.  You get new symptoms.  You have trouble walking.  You have trouble doing everyday activities.  You have pain that gets worse.  You have pain that does not get better with medicine.  You get red skin on your hip area.  You get a feeling of warmth in your hip area. Get help right away if:  You cannot move your hip.  You have very bad pain. Summary  Hip bursitis is swelling of a fluid-filled sac (bursa) in your hip.  Hip bursitis can be painful.  Symptoms often come and go over time.  This condition is treated with rest, ice, compression, elevation, and medicines. This information is not intended to replace advice given to you by your health care provider. Make sure you discuss any questions you have with  your health care provider. Document Released: 09/08/2010 Document Revised: 04/14/2018 Document Reviewed: 04/14/2018 Elsevier Patient Education  [place].   Voltaren gel, ice

## 2019-03-24 NOTE — Assessment & Plan Note (Signed)
Appt w/dr Karsten Ro today

## 2019-03-24 NOTE — Progress Notes (Signed)
Subjective:  Patient ID: Larry [name], male    DOB: 06/19/1941  Age: 78 y.o. MRN: [id number]  CC: No chief complaint on file.   HPI Larry [name] presents for DM, CAD, edema C/o severe L hip pain x 4 at night only  Outpatient Medications Prior to Visit  Medication Sig Dispense Refill  . ACCU-CHEK FASTCLIX LANCETS MISC 1 each by Does not apply route 2 (two) times daily. Dx:E11.9 100 each 11  . aspirin 325 MG tablet Take 1 tablet (325 mg total) by mouth daily. 60 tablet 0  . carvedilol (COREG) 25 MG tablet Take 1 tablet (25 mg total) by mouth 2 (two) times daily. 180 tablet 3  . Cholecalciferol (VITAMIN D3) 1000 UNITS CAPS Take 1,000 Units by mouth daily.     . folic acid (FOLVITE) 1 MG tablet Take 1 tablet (1 mg total) by mouth daily. 30 tablet 3  . glimepiride (AMARYL) 2 MG tablet TAKE 1 TABLET BY MOUTH TWICE DAILY (Patient taking differently: Take 2 mg by mouth 2 (two) times daily. ) 180 tablet 3  . glucose monitoring kit (FREESTYLE) monitoring kit 1 each by Does not apply route as needed for other. Please dispense brand of choice per ins/patient. Dx: 250.00. 1 each 0  . hydrALAZINE (APRESOLINE) 50 MG tablet Take 1 tablet (50 mg total) by mouth 3 (three) times daily. 270 tablet 3  . losartan (COZAAR) 100 MG tablet Take 1 tablet (100 mg total) by mouth daily. HOLD UNTIL SEEN BY YOUR PCP 90 tablet 3  . metFORMIN (GLUCOPHAGE) 1000 MG tablet Take 1 tablet (1,000 mg total) by mouth 2 (two) times daily with a meal. 60 tablet 11  . Multiple Vitamins-Minerals (MACULAR VITAMIN BENEFIT) TABS Take 1 tablet by mouth 2 (two) times daily.    . nitroGLYCERIN (NITROSTAT) 0.4 MG SL tablet Place 1 tablet (0.4 mg total) under the tongue every 5 (five) minutes as needed for chest pain. 20 tablet 3  . simvastatin (ZOCOR) 40 MG tablet Take 1 tablet (40 mg total) by mouth every evening. 30 tablet 11  . thiamine 100 MG tablet Take 1 tablet (100 mg total) by mouth daily. 30 tablet 5  . furosemide (LASIX) 20  MG tablet Take 1 tablet (20 mg total) by mouth daily. (Patient taking differently: Take 10 mg by mouth daily. ) 90 tablet 3   No facility-administered medications prior to visit.     ROS: Review of Systems  Constitutional: Negative for appetite change, fatigue and unexpected weight change.  HENT: Negative for congestion, nosebleeds, sneezing, sore throat and trouble swallowing.   Eyes: Negative for itching and visual disturbance.  Respiratory: Negative for cough.   Cardiovascular: Positive for leg swelling. Negative for chest pain and palpitations.  Gastrointestinal: Negative for abdominal distention, blood in stool, diarrhea and nausea.  Genitourinary: Negative for frequency and hematuria.  Musculoskeletal: Positive for arthralgias. Negative for back pain, gait problem, joint swelling and neck pain.  Skin: Negative for rash.  Neurological: Negative for dizziness, tremors, speech difficulty and weakness.  Psychiatric/Behavioral: Negative for agitation, dysphoric mood and sleep disturbance. The patient is not nervous/anxious.     Objective:  BP (!) 150/90 (BP Location: Left Arm, Patient Position: Sitting, Cuff Size: Large)   Pulse 68   Temp 98.5 F (36.9 C) (Oral)   Ht 6' 1" (1.854 m)   Wt 256 lb (116.1 kg)   SpO2 96%   BMI 33.78 kg/m   BP Readings from Last 3 Encounters:  03/24/19 (!) 150/90  01/28/19 (!) 144/72  01/24/19 (!) 160/57    Wt Readings from Last 3 Encounters:  03/24/19 256 lb (116.1 kg)  01/28/19 257 lb (116.6 kg)  01/24/19 240 lb (108.9 kg)    Physical Exam Constitutional:      General: He is not in acute distress.    Appearance: He is well-developed.     Comments: NAD  Eyes:     Conjunctiva/sclera: Conjunctivae normal.     Pupils: Pupils are equal, round, and reactive to light.  Neck:     Musculoskeletal: Normal range of motion.     Thyroid: No thyromegaly.     Vascular: No JVD.  Cardiovascular:     Rate and Rhythm: Normal rate and regular rhythm.      Heart sounds: Normal heart sounds. No murmur. No friction rub. No gallop.   Pulmonary:     Effort: Pulmonary effort is normal. No respiratory distress.     Breath sounds: Normal breath sounds. No wheezing or rales.  Chest:     Chest wall: No tenderness.  Abdominal:     General: Bowel sounds are normal. There is no distension.     Palpations: Abdomen is soft. There is no mass.     Tenderness: There is no abdominal tenderness. There is no guarding or rebound.  Musculoskeletal: Normal range of motion.        General: Tenderness present.     Right lower leg: Edema present.     Left lower leg: Edema present.  Lymphadenopathy:     Cervical: No cervical adenopathy.  Skin:    General: Skin is warm and dry.     Findings: No rash.  Neurological:     Mental Status: He is alert and oriented to person, place, and time.     Cranial Nerves: No cranial nerve deficit.     Motor: No abnormal muscle tone.     Coordination: Coordination normal.     Gait: Gait normal.     Deep Tendon Reflexes: Reflexes are normal and symmetric.  Psychiatric:        Behavior: Behavior normal.        Thought Content: Thought content normal.        Judgment: Judgment normal.    Trace to 1+ edema L lat hip tender to palp ROM in hips ok   Lab Results  Component Value Date   WBC 10.4 01/28/2019   HGB 10.7 (L) 01/28/2019   HCT 32.3 (L) 01/28/2019   PLT 239.0 01/28/2019   GLUCOSE 74 01/28/2019   CHOL 129 11/20/2018   TRIG 113 11/20/2018   HDL 38 (L) 11/20/2018   LDLCALC 68 11/20/2018   ALT 12 01/28/2019   AST 13 01/28/2019   NA 141 01/28/2019   K 4.4 01/28/2019   CL 106 01/28/2019   CREATININE 1.27 01/28/2019   BUN 30 (H) 01/28/2019   CO2 27 01/28/2019   TSH 2.19 03/25/2018   PSA 0.00 Repeated and verified X2. (L) 12/26/2012   INR 1.0 11/19/2018   HGBA1C 6.7 (H) 01/28/2019    Mr Brain W AS Contrast  Result Date: 03/15/2019  [facility] [address] [place],  [place] [address] [phone] NEUROIMAGING REPORT STUDY DATE: 03/14/2019 PATIENT NAME: Larry [name] DOB: 1940-10-31 MRN: [id number] EXAM: MRI Brain with and without contrast ORDERING CLINICIAN: Antony Contras, MD CLINICAL HISTORY: 78 year old man with stroke COMPARISON FILMS: CT 11/19/2018 TECHNIQUE:MRI of the brain with and without contrast was obtained  utilizing 5 mm axial slices with T1, T2, T2 flair, SWI and diffusion weighted views.  T1 sagittal, T2 coronal and postcontrast views in the axial and coronal plane were obtained. CONTRAST: 20 ml [employer] IMAGING SITE: [facility], [place]. FINDINGS: On sagittal images, the spinal cord is imaged caudally to C2-C3 and is normal in caliber.   The contents of the posterior fossa are of normal size and position.   The pituitary gland and optic chiasm appear normal.    There is mild generalized cortical atrophy.  There are no abnormal extra-axial collections of fluid.  The cerebellum and brainstem appears normal.   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  There are some scattered T2/flair hyperintense foci in the subcortical and deep white matter of the hemispheres.  None of these appear to be acute..   Diffusion weighted images are normal.  Susceptibility weighted images show a single chronic microhemorrhage in the superior left occipital lobe..   The VIIth/VIIIth nerve complex appears normal.  There have been bilateral lens replacements.  The orbits are otherwise normal.  The mastoid air cells appear normal.  The paranasal sinuses appear normal.  Flow voids are identified within the major intracerebral arteries.  After the infusion of contrast material, a normal enhancement pattern is noted.   This MRI of the brain with and without contrast shows the following: 1.   Mild generalized cortical atrophy. 2.   Chronic right basal ganglia lacune that was also chronic on the 11/19/2018 CT  scan. 3.   Mild chronic microvascular ischemic changes 4.   Single nonspecific chronic microhemorrhage in the superior left occipital lobe. 5.   There are no acute findings and there is a normal enhancement pattern. INTERPRETING PHYSICIAN: Richard A. Felecia Shelling, MD, PhD, FAAN Certified in  Neuroimaging by [employer]    Assessment & Plan:    Follow-up: No follow-ups on file.  Walker Kehr, MD

## 2019-03-24 NOTE — Assessment & Plan Note (Addendum)
L hip bursitis Voltaren gel, ice We can inject Stretch

## 2019-03-24 NOTE — Assessment & Plan Note (Signed)
Dr Leonie Man 7/20 MRI IMPRESSION: This MRI of the brain with and without contrast shows the following: 1.   Mild generalized cortical atrophy. 2.   Chronic right basal ganglia lacune that was also chronic on the 11/19/2018 CT scan. 3.   Mild chronic microvascular ischemic changes 4.   Single nonspecific chronic microhemorrhage in the superior left occipital lobe. 5.   There are no acute findings and there is a normal enhancement pattern  INTERPRETING PHYSICIAN:  Richard A. Felecia Shelling, MD, PhD, FAAN Certified in  Neuroimaging by [employer]  ASA, Simvastatin

## 2019-03-24 NOTE — Assessment & Plan Note (Signed)
Labs

## 2019-03-26 ENCOUNTER — Telehealth: Payer: Self-pay | Admitting: Internal Medicine

## 2019-03-26 NOTE — Telephone Encounter (Signed)
Did you tell him to come back for a injection? Last OV note states 3 months.  Does it need to be a nurse visit for IM injection or OV?

## 2019-03-26 NOTE — Telephone Encounter (Signed)
Patient's wife called requesting to speak to you. She said that the patient was seen on Tuesday and was told to come back next week for a cortisone injection in his hip. She said that he can not wait until next week. Please advise.

## 2019-03-26 NOTE — Telephone Encounter (Signed)
OV is scheduled 03/27/19 @ 8:00am.

## 2019-03-26 NOTE — Telephone Encounter (Addendum)
Wife calling back to ask if pt can come in today for the cortisone.. She does not feel he can make until next week. Pt wants to come now.  Transferred call to the office

## 2019-03-27 ENCOUNTER — Encounter: Payer: Self-pay | Admitting: Internal Medicine

## 2019-03-27 ENCOUNTER — Ambulatory Visit (INDEPENDENT_AMBULATORY_CARE_PROVIDER_SITE_OTHER): Payer: Medicare Other | Admitting: Internal Medicine

## 2019-03-27 ENCOUNTER — Other Ambulatory Visit: Payer: Self-pay

## 2019-03-27 VITALS — BP 136/70 | HR 65 | Temp 97.7°F

## 2019-03-27 DIAGNOSIS — I25709 Atherosclerosis of coronary artery bypass graft(s), unspecified, with unspecified angina pectoris: Secondary | ICD-10-CM | POA: Diagnosis not present

## 2019-03-27 DIAGNOSIS — I639 Cerebral infarction, unspecified: Secondary | ICD-10-CM

## 2019-03-27 DIAGNOSIS — M25552 Pain in left hip: Secondary | ICD-10-CM

## 2019-03-27 DIAGNOSIS — E1159 Type 2 diabetes mellitus with other circulatory complications: Secondary | ICD-10-CM | POA: Diagnosis not present

## 2019-03-27 MED ORDER — METHYLPREDNISOLONE ACETATE 40 MG/ML IJ SUSP
40.0000 mg | Freq: Once | INTRAMUSCULAR | Status: AC
  Administered 2019-03-27: 40 mg via INTRA_ARTICULAR

## 2019-03-27 MED ORDER — THIAMINE HCL 100 MG PO TABS: 100.0000 mg | ORAL_TABLET | Freq: Every day | ORAL | 3 refills | Status: DC

## 2019-03-27 MED ORDER — FOLIC ACID 1 MG PO TABS: 1.0000 mg | ORAL_TABLET | Freq: Every day | ORAL | 3 refills | Status: DC

## 2019-03-27 MED ORDER — NITROGLYCERIN 0.4 MG SL SUBL: 0.4000 mg | SUBLINGUAL_TABLET | SUBLINGUAL | 3 refills | Status: DC | PRN

## 2019-03-27 NOTE — Assessment & Plan Note (Signed)
No angina - meds renewed

## 2019-03-27 NOTE — Assessment & Plan Note (Signed)
Discussed steroid inj and sugars

## 2019-03-27 NOTE — Assessment & Plan Note (Signed)
Worse See procedure

## 2019-03-27 NOTE — Progress Notes (Signed)
Subjective:  Patient ID: Larry [name], male    DOB: 29-Aug-1940  Age: 78 y.o. MRN: [id number]  CC: No chief complaint on file.   HPI Larry [name] presents for L hip pain - worse F/u CAD, DM - needs meds renewed  Outpatient Medications Prior to Visit  Medication Sig Dispense Refill   ACCU-CHEK FASTCLIX LANCETS MISC 1 each by Does not apply route 2 (two) times daily. Dx:E11.9 100 each 11   aspirin 325 MG tablet Take 1 tablet (325 mg total) by mouth daily. 60 tablet 0   carvedilol (COREG) 25 MG tablet Take 1 tablet (25 mg total) by mouth 2 (two) times daily. 180 tablet 3   Cholecalciferol (VITAMIN D3) 1000 UNITS CAPS Take 1,000 Units by mouth daily.      folic acid (FOLVITE) 1 MG tablet Take 1 tablet (1 mg total) by mouth daily. 30 tablet 3   glimepiride (AMARYL) 2 MG tablet TAKE 1 TABLET BY MOUTH TWICE DAILY (Patient taking differently: Take 2 mg by mouth 2 (two) times daily. ) 180 tablet 3   glucose monitoring kit (FREESTYLE) monitoring kit 1 each by Does not apply route as needed for other. Please dispense brand of choice per ins/patient. Dx: 250.00. 1 each 0   hydrALAZINE (APRESOLINE) 50 MG tablet Take 1 tablet (50 mg total) by mouth 3 (three) times daily. 270 tablet 3   losartan (COZAAR) 100 MG tablet Take 1 tablet (100 mg total) by mouth daily. HOLD UNTIL SEEN BY YOUR PCP 90 tablet 3   metFORMIN (GLUCOPHAGE) 1000 MG tablet Take 1 tablet (1,000 mg total) by mouth 2 (two) times daily with a meal. 180 tablet 3   Multiple Vitamins-Minerals (MACULAR VITAMIN BENEFIT) TABS Take 1 tablet by mouth 2 (two) times daily.     nitroGLYCERIN (NITROSTAT) 0.4 MG SL tablet Place 1 tablet (0.4 mg total) under the tongue every 5 (five) minutes as needed for chest pain. 20 tablet 3   simvastatin (ZOCOR) 40 MG tablet Take 1 tablet (40 mg total) by mouth every evening. 90 tablet 3   thiamine 100 MG tablet Take 1 tablet (100 mg total) by mouth daily. 90 tablet 3   furosemide (LASIX) 20 MG  tablet Take 1 tablet (20 mg total) by mouth daily. (Patient taking differently: Take 10 mg by mouth daily. ) 90 tablet 3   No facility-administered medications prior to visit.     ROS: Review of Systems  Constitutional: Negative for fever.  HENT: Negative for congestion.   Respiratory: Negative for cough.   Cardiovascular: Positive for leg swelling. Negative for chest pain and palpitations.  Musculoskeletal: Positive for arthralgias and gait problem.  Psychiatric/Behavioral: Negative for suicidal ideas.    Objective:  BP 136/70    Pulse 65    Temp 97.7 F (36.5 C)    SpO2 97%   BP Readings from Last 3 Encounters:  03/27/19 136/70  03/24/19 (!) 150/90  01/28/19 (!) 144/72    Wt Readings from Last 3 Encounters:  03/24/19 256 lb (116.1 kg)  01/28/19 257 lb (116.6 kg)  01/24/19 240 lb (108.9 kg)    Physical Exam Constitutional:      Appearance: Normal appearance. He is obese.  Cardiovascular:     Rate and Rhythm: Normal rate.  Abdominal:     General: There is no distension.     Tenderness: There is no abdominal tenderness.  Musculoskeletal:        General: Tenderness present.  Skin:  General: Skin is warm.     Coloration: Skin is not pale.     Findings: No lesion or rash.  Neurological:     Mental Status: He is oriented to person, place, and time.   L lat hip - tender   Procedure Note :     Procedure : Joint Injection,  L  hip   Indication:  Trochanteric bursitis with refractory  chronic pain.   Risks including unsuccessful procedure , bleeding, infection, bruising, skin atrophy, "steroid flare-up" and others were explained to the patient in detail as well as the benefits. Informed consent was obtained and signed.   Tthe patient was placed in a comfortable lateral decubitus position. The point of maximal tenderness was identified. Skin was prepped with Betadine and alcohol. Then, a 5 cc syringe with a 2 inch long 24-gauge needle was used for a bursa injection..  The needle was advanced  Into the bursa. I injected the bursa with 4 mL of 2% lidocaine and 40 mg of Depo-Medrol .  Band-Aid was applied.   Tolerated well. Complications: None. Good pain relief following the procedure.    Lab Results  Component Value Date   WBC 10.4 01/28/2019   HGB 10.7 (L) 01/28/2019   HCT 32.3 (L) 01/28/2019   PLT 239.0 01/28/2019   GLUCOSE 74 01/28/2019   CHOL 129 11/20/2018   TRIG 113 11/20/2018   HDL 38 (L) 11/20/2018   LDLCALC 68 11/20/2018   ALT 12 01/28/2019   AST 13 01/28/2019   NA 141 01/28/2019   K 4.4 01/28/2019   CL 106 01/28/2019   CREATININE 1.27 01/28/2019   BUN 30 (H) 01/28/2019   CO2 27 01/28/2019   TSH 2.19 03/25/2018   PSA 0.00 Repeated and verified X2. (L) 12/26/2012   INR 1.0 11/19/2018   HGBA1C 6.7 (H) 01/28/2019    Mr Brain W KV Contrast  Result Date: 03/15/2019  [facility] [address] [place], [place] [address] [phone] NEUROIMAGING REPORT STUDY DATE: 03/14/2019 PATIENT NAME: Larry [name] DOB: 1941-03-30 MRN: [id number] EXAM: MRI Brain with and without contrast ORDERING CLINICIAN: Antony Contras, MD CLINICAL HISTORY: 78 year old man with stroke COMPARISON FILMS: CT 11/19/2018 TECHNIQUE:MRI of the brain with and without contrast was obtained utilizing 5 mm axial slices with T1, T2, T2 flair, SWI and diffusion weighted views.  T1 sagittal, T2 coronal and postcontrast views in the axial and coronal plane were obtained. CONTRAST: 20 ml [employer] IMAGING SITE: [facility], [place]. FINDINGS: On sagittal images, the spinal cord is imaged caudally to C2-C3 and is normal in caliber.   The contents of the posterior fossa are of normal size and position.   The pituitary gland and optic chiasm appear normal.    There is mild generalized cortical atrophy.  There are no abnormal extra-axial collections of fluid.  The cerebellum and brainstem appears normal.   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  There are some scattered T2/flair hyperintense foci in the subcortical and deep white matter of the hemispheres.  None of these appear to be acute..   Diffusion weighted images are normal.  Susceptibility weighted images show a single chronic microhemorrhage in the superior left occipital lobe..   The VIIth/VIIIth nerve complex appears normal.  There have been bilateral lens replacements.  The orbits are otherwise normal.  The mastoid air cells appear normal.  The paranasal sinuses  appear normal.  Flow voids are identified within the major intracerebral arteries.  After the infusion of contrast material, a normal enhancement pattern is noted.   This MRI of the brain with and without contrast shows the following: 1.   Mild generalized cortical atrophy. 2.   Chronic right basal ganglia lacune that was also chronic on the 11/19/2018 CT scan. 3.   Mild chronic microvascular ischemic changes 4.   Single nonspecific chronic microhemorrhage in the superior left occipital lobe. 5.   There are no acute findings and there is a normal enhancement pattern. INTERPRETING PHYSICIAN: Richard A. Felecia Shelling, MD, PhD, FAAN Certified in  Neuroimaging by [employer]    Assessment & Plan:   There are no diagnoses linked to this encounter.   No orders of the defined types were placed in this encounter.    Follow-up: No follow-ups on file.  Walker Kehr, MD

## 2019-03-27 NOTE — Addendum Note (Signed)
Addended by: Karle Barr on: 03/27/2019 09:26 AM   Modules accepted: Orders

## 2019-03-30 DIAGNOSIS — H40013 Open angle with borderline findings, low risk, bilateral: Secondary | ICD-10-CM | POA: Diagnosis not present

## 2019-03-30 DIAGNOSIS — E119 Type 2 diabetes mellitus without complications: Secondary | ICD-10-CM | POA: Diagnosis not present

## 2019-03-30 DIAGNOSIS — H35033 Hypertensive retinopathy, bilateral: Secondary | ICD-10-CM | POA: Diagnosis not present

## 2019-03-30 DIAGNOSIS — H353132 Nonexudative age-related macular degeneration, bilateral, intermediate dry stage: Secondary | ICD-10-CM | POA: Diagnosis not present

## 2019-04-07 DIAGNOSIS — N5231 Erectile dysfunction following radical prostatectomy: Secondary | ICD-10-CM | POA: Diagnosis not present

## 2019-05-11 ENCOUNTER — Other Ambulatory Visit: Payer: Self-pay

## 2019-05-11 ENCOUNTER — Ambulatory Visit (INDEPENDENT_AMBULATORY_CARE_PROVIDER_SITE_OTHER): Payer: Medicare Other | Admitting: Neurology

## 2019-05-11 ENCOUNTER — Encounter: Payer: Self-pay | Admitting: Neurology

## 2019-05-11 ENCOUNTER — Telehealth: Payer: Self-pay

## 2019-05-11 VITALS — BP 152/62 | HR 65 | Ht 72.0 in | Wt 255.0 lb

## 2019-05-11 DIAGNOSIS — G459 Transient cerebral ischemic attack, unspecified: Secondary | ICD-10-CM | POA: Diagnosis not present

## 2019-05-11 DIAGNOSIS — I639 Cerebral infarction, unspecified: Secondary | ICD-10-CM

## 2019-05-11 NOTE — Progress Notes (Signed)
[facility] [address] [place]. [place] [address] [phone]       OFFICE FOLLOW-UP NOTE  [name] Date of Birth:  1940-11-20 Medical Record Number:  [id number]   HPI  Initial virtual video visit 02/03/2019 :  Larry [name] is seen today for virtual video follow-up visit following hospital admission for possible stroke in April 2020.  History is obtained from the patient and his wife and review of electronic medical records.  I personally reviewed imaging films in PACS.  The patient was in his normal state of health till 8 PM on the night prior to admission.  He started complaining that he is feeling better and needed to go to bed.  Over the course of the night he developed severe nausea and frequent bouts of vomiting.  His wife noticed that when he tried to speak he is was speaking gibberish and did not make much sense.  He initially resisted admission but subsequently wife brought him to the hospital.  EMS called a code stroke in route due to aphasia and right-sided weakness.  After arrival to the emergency department developed progressive hypoxia and tachycardia and chest x-ray showed CHF pattern.  TPA was not given as he presented outside time window.  Patient was intubated for respiratory failure.  CT head showed old right basal ganglia lacune.  CT angiogram showed no large vessel occlusion.  There was moderate bilateral ICA stenosis with mild atherosclerotic changes and right vertebral artery, anterior, middle cerebral and posterior cerebral arteries.  CT angiogram of the neck showed evidence of old left carotid endarterectomy with 60% stenosis.  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  CT perfusion scan showed a 4 cc left occipital penumbra without any cord infarct.  Spinal tap showed 27 red cells, 2 white cells, normal glucose and protein was minimally elevated at 52 mg percent.  Urine drug screen was negative.  LDL  cholesterol 67 mg percent.  Hemoglobin A1c was 6.5.  MRI scan was planned but could not be obtained due to restrictions due to the ongoing COVID pandemic.  Patient's COVID test was negative.  Patient's mental status improved significantly he was extubated.  He was back to his baseline soon with no neurological deficits.  He was started on aspirin and continued on his statin and diabetic and blood pressure medications.  Patient states is done well since discharge.  He has had no further episodes of speech difficulties or other strokelike symptoms.  He is tolerating aspirin well without any side effects.  States his blood pressure sugar and cholesterol all are under good control.  He has no new complaints today. Update 05/11/2019 : He returns for follow-up after last visit 3 months ago.  He states he is doing well.  He has had no further TIA or stroke symptoms since initial episode in April 2020.  He did undergo EEG on 02/19/2019 which was normal.  MRI scan of the brain on 03/15/2019 showed mild generalized atrophy and old right basal ganglia infarct.  No brainstem infarct was noted.  Patient states he is tolerating aspirin well without bruising or bleeding.  His blood pressure has been running slightly high with systolic in 188-416 range and today it is 152/62.  He plans to discuss this with his primary physician Dr. Alain Marion with upcoming visit.  He states his diabetes control is good and last hemoglobin A1c was 6.3.  Is tolerating Zocor well without muscle aches and  pains.  He does have mild paresthesias in his feet and has mild diabetic neuropathy.  He is complaining of increased swelling in his feet.  He was previously on Norvasc which has been changed.  He plans to discuss this with his primary physician as well.  He has no new neurological complaints today. ROS:   14 system review of systems is positive for leg swelling, tingling in the feet, imbalance and all other systems negative PMH:  Past Medical  History:  Diagnosis Date   Carotid stenosis, left    Cataracts, bilateral    removed   Coronary artery disease    Hyperlipemia    Prostate CA (HCC)    Sleep apnea    Dr Ledora Bottcher ; uses CPAP   Stroke ([facility])    Type II or unspecified type diabetes mellitus without mention of complication, not stated as uncontrolled    Unspecified essential hypertension     Social History:  Social History   Socioeconomic History   Marital status: Married    Spouse name: Not on file   Number of children: Not on file   Years of education: Not on file   Highest education level: Bachelor's degree (e.g., BA, AB, BS)  Occupational History   Occupation: Field seismologist strain: Not hard at all   Food insecurity    Worry: Never true    Inability: Never true   Transportation needs    Medical: Not on file    Non-medical: Not on file  Tobacco Use   Smoking status: Never Smoker   Smokeless tobacco: Never Used  Substance and Sexual Activity   Alcohol use: No    Alcohol/week: 0.0 standard drinks   Drug use: No   Sexual activity: Yes  Lifestyle   Physical activity    Days per week: 0 days    Minutes per session: 0 min   Stress: Not at all  Relationships   Social connections    Talks on phone: Not on file    Gets together: Not on file    Attends religious service: Not on file    Active member of club or organization: Not on file    Attends meetings of clubs or organizations: Not on file    Relationship status: Not on file   Intimate partner violence    Fear of current or ex partner: Not on file    Emotionally abused: Not on file    Physically abused: Not on file    Forced sexual activity: Not on file  Other Topics Concern   Not on file  Social History Narrative   Regular Exercise- no, golf    Medications:   Current Outpatient Medications on File Prior to Visit  Medication Sig Dispense Refill   ACCU-CHEK FASTCLIX LANCETS MISC 1 each  by Does not apply route 2 (two) times daily. Dx:E11.9 100 each 11   aspirin 325 MG tablet Take 1 tablet (325 mg total) by mouth daily. 60 tablet 0   carvedilol (COREG) 25 MG tablet Take 1 tablet (25 mg total) by mouth 2 (two) times daily. 180 tablet 3   Cholecalciferol (VITAMIN D3) 1000 UNITS CAPS Take 1,000 Units by mouth daily.      folic acid (FOLVITE) 1 MG tablet Take 1 tablet (1 mg total) by mouth daily. 90 tablet 3   furosemide (LASIX) 20 MG tablet Take 1 tablet (20 mg total) by mouth daily. (Patient taking differently: Take 10 mg by mouth  daily. ) 90 tablet 3   glimepiride (AMARYL) 2 MG tablet TAKE 1 TABLET BY MOUTH TWICE DAILY (Patient taking differently: Take 2 mg by mouth 2 (two) times daily. ) 180 tablet 3   glucose monitoring kit (FREESTYLE) monitoring kit 1 each by Does not apply route as needed for other. Please dispense brand of choice per ins/patient. Dx: 250.00. 1 each 0   hydrALAZINE (APRESOLINE) 50 MG tablet Take 1 tablet (50 mg total) by mouth 3 (three) times daily. 270 tablet 3   losartan (COZAAR) 100 MG tablet Take 1 tablet (100 mg total) by mouth daily. HOLD UNTIL SEEN BY YOUR PCP 90 tablet 3   metFORMIN (GLUCOPHAGE) 1000 MG tablet Take 1 tablet (1,000 mg total) by mouth 2 (two) times daily with a meal. 180 tablet 3   Multiple Vitamins-Minerals (MACULAR VITAMIN BENEFIT) TABS Take 1 tablet by mouth 2 (two) times daily.     nitroGLYCERIN (NITROSTAT) 0.4 MG SL tablet Place 1 tablet (0.4 mg total) under the tongue every 5 (five) minutes as needed for chest pain. 20 tablet 3   simvastatin (ZOCOR) 40 MG tablet Take 1 tablet (40 mg total) by mouth every evening. 90 tablet 3   thiamine 100 MG tablet Take 1 tablet (100 mg total) by mouth daily. 90 tablet 3   No current facility-administered medications on file prior to visit.     Allergies:   Allergies  Allergen Reactions   Amlodipine Swelling    Leg swelling   Verapamil Swelling    Edema w/high dose    Clindamycin Hcl     UNSPECIFIED REACTION    Contrast Media [Iodinated Diagnostic Agents] Nausea And Vomiting    Physical Exam General: Obese elderly Caucasian male, seated, in no evident distress Head: head normocephalic and atraumatic.  Neck: supple with no carotid or supraclavicular bruits Cardiovascular: regular rate and rhythm, no murmurs Musculoskeletal: no deformity Skin:  no rash/petichiae.. Pedal edema 2+ in both lower extremities. Vascular:  Normal pulses all extremities Vitals:   05/11/19 0954  BP: (!) 152/62  Pulse: 65   Neurologic Exam Mental Status: Awake and fully alert. Oriented to place and time. Recent and remote memory intact. Attention span, concentration and fund of knowledge appropriate. Mood and affect appropriate.  Cranial Nerves: Fundoscopic exam reveals sharp disc margins. Pupils equal, briskly reactive to light. Extraocular movements full without nystagmus. Visual fields full to confrontation. Hearing intact. Facial sensation intact. Face, tongue, palate moves normally and symmetrically.  Motor: Normal bulk and tone. Normal strength in all tested extremity muscles. Sensory.: intact to touch ,pinprick .position  sensation.  But diminished vibration sensation in both feet from ankle down. Coordination: Rapid alternating movements normal in all extremities. Finger-to-nose and heel-to-shin performed accurately bilaterally.  Slightly unsteady while standing on either foot unsupported. Gait and Station: Arises from chair without difficulty. Stance is normal. Gait demonstrates normal stride length and balance .  Unable to walk tandem. Reflexes: 1+ and symmetric except knee jerks are depressed and ankle jerks absent bilaterally.. Toes downgoing.       ASSESSMENT: 78 year old gentleman with transient episode of nausea vomiting and expressive speech difficulties with questionable right-sided weakness in April 2020 possibly brainstem infarct not visualized on CT scan.   MRI not done.  Vascular risk factors of diabetes, hypertension, hyperlipidemia, obesity and carotid disease and intra-and extracranial atherosclerosis     PLAN: I had a long d/w patient and his wife about his recent TIA,silent stroke on MRI, risk for recurrent stroke/TIAs, personally  independently reviewed imaging studies and stroke evaluation results and answered questions.Continue aspirin 325 mg daily  for secondary stroke prevention and maintain strict control of hypertension with blood pressure goal below 130/90, diabetes with hemoglobin A1c goal below 6.5% and lipids with LDL cholesterol goal below 70 mg/dL. I also advised the patient to eat a healthy diet with plenty of whole grains, cereals, fruits and vegetables, exercise regularly and maintain ideal body weight Followup in the future with my nurse practitioner Janett Billow in 6 months or call earlier if necessary. Greater than 50% of time during this 25 minute visit was spent on counseling,explanation of diagnosis, planning of further management, discussion with patient and family and coordination of care Larry Contras, MD  [facility] [address] [place], [place] [address]  Phone [phone] Fax [phone] Note: This document was prepared with digital dictation and possible smart phrase technology. Any transcriptional errors that result from this process are unintentional

## 2019-05-11 NOTE — Telephone Encounter (Signed)
IF patient calls back please schedule him with Janett Billow NP in 6 months per D SEthi note.  Vm was left for patient to call back to r/s follow up appt with Janett Billow NP.

## 2019-05-11 NOTE — Patient Instructions (Signed)
I had a long d/w patient and his wife about his recent TIA,silent stroke on MRI, risk for recurrent stroke/TIAs, personally independently reviewed imaging studies and stroke evaluation results and answered questions.Continue aspirin 325 mg daily  for secondary stroke prevention and maintain strict control of hypertension with blood pressure goal below 130/90, diabetes with hemoglobin A1c goal below 6.5% and lipids with LDL cholesterol goal below 70 mg/dL. I also advised the patient to eat a healthy diet with plenty of whole grains, cereals, fruits and vegetables, exercise regularly and maintain ideal body weight Followup in the future with my nurse practitioner Janett Billow in 6 months or call earlier if necessary.

## 2019-05-12 NOTE — Telephone Encounter (Signed)
I called pt and spoke with wife about scheduling a 6 month follow up with Janett Billow NP.PT schedule and wife has appt. I stated to check in at 0915am. The wife stated her husband is on mychart and can see the appt.

## 2019-05-18 DIAGNOSIS — L821 Other seborrheic keratosis: Secondary | ICD-10-CM | POA: Diagnosis not present

## 2019-05-18 DIAGNOSIS — L570 Actinic keratosis: Secondary | ICD-10-CM | POA: Diagnosis not present

## 2019-05-18 DIAGNOSIS — Z85828 Personal history of other malignant neoplasm of skin: Secondary | ICD-10-CM | POA: Diagnosis not present

## 2019-06-15 DIAGNOSIS — C61 Malignant neoplasm of prostate: Secondary | ICD-10-CM | POA: Diagnosis not present

## 2019-06-24 ENCOUNTER — Other Ambulatory Visit: Payer: Self-pay

## 2019-06-24 ENCOUNTER — Other Ambulatory Visit (INDEPENDENT_AMBULATORY_CARE_PROVIDER_SITE_OTHER): Payer: Medicare Other

## 2019-06-24 ENCOUNTER — Ambulatory Visit (INDEPENDENT_AMBULATORY_CARE_PROVIDER_SITE_OTHER): Payer: Medicare Other | Admitting: Internal Medicine

## 2019-06-24 ENCOUNTER — Encounter: Payer: Self-pay | Admitting: Internal Medicine

## 2019-06-24 VITALS — BP 138/64 | HR 67 | Temp 97.9°F | Ht 72.0 in | Wt 256.0 lb

## 2019-06-24 DIAGNOSIS — D509 Iron deficiency anemia, unspecified: Secondary | ICD-10-CM

## 2019-06-24 DIAGNOSIS — I639 Cerebral infarction, unspecified: Secondary | ICD-10-CM | POA: Diagnosis not present

## 2019-06-24 DIAGNOSIS — R5383 Other fatigue: Secondary | ICD-10-CM

## 2019-06-24 DIAGNOSIS — E1159 Type 2 diabetes mellitus with other circulatory complications: Secondary | ICD-10-CM | POA: Diagnosis not present

## 2019-06-24 DIAGNOSIS — R600 Localized edema: Secondary | ICD-10-CM

## 2019-06-24 DIAGNOSIS — I10 Essential (primary) hypertension: Secondary | ICD-10-CM

## 2019-06-24 DIAGNOSIS — G459 Transient cerebral ischemic attack, unspecified: Secondary | ICD-10-CM | POA: Insufficient documentation

## 2019-06-24 DIAGNOSIS — Z23 Encounter for immunization: Secondary | ICD-10-CM | POA: Diagnosis not present

## 2019-06-24 LAB — CBC WITH DIFFERENTIAL/PLATELET
Basophils Absolute: 0.1 10*3/uL (ref 0.0–0.1)
Basophils Relative: 1.1 % (ref 0.0–3.0)
Eosinophils Absolute: 0.2 10*3/uL (ref 0.0–0.7)
Eosinophils Relative: 2.3 % (ref 0.0–5.0)
HCT: 37 % — ABNORMAL LOW (ref 39.0–52.0)
Hemoglobin: 12.1 g/dL — ABNORMAL LOW (ref 13.0–17.0)
Lymphocytes Relative: 31.5 % (ref 12.0–46.0)
Lymphs Abs: 2.9 10*3/uL (ref 0.7–4.0)
MCHC: 32.7 g/dL (ref 30.0–36.0)
MCV: 89.8 fl (ref 78.0–100.0)
Monocytes Absolute: 0.7 10*3/uL (ref 0.1–1.0)
Monocytes Relative: 8.1 % (ref 3.0–12.0)
Neutro Abs: 5.2 10*3/uL (ref 1.4–7.7)
Neutrophils Relative %: 57 % (ref 43.0–77.0)
Platelets: 236 10*3/uL (ref 150.0–400.0)
RBC: 4.12 Mil/uL — ABNORMAL LOW (ref 4.22–5.81)
RDW: 13.8 % (ref 11.5–15.5)
WBC: 9.2 10*3/uL (ref 4.0–10.5)

## 2019-06-24 LAB — TSH: TSH: 2.18 u[IU]/mL (ref 0.35–4.50)

## 2019-06-24 LAB — HEMOGLOBIN A1C: Hgb A1c MFr Bld: 6.6 % — ABNORMAL HIGH (ref 4.6–6.5)

## 2019-06-24 MED ORDER — THIAMINE HCL 100 MG PO TABS: 100.0000 mg | ORAL_TABLET | Freq: Every day | ORAL | 3 refills | Status: AC

## 2019-06-24 MED ORDER — REPAGLINIDE 1 MG PO TABS: 1.0000 mg | ORAL_TABLET | Freq: Three times a day (TID) | ORAL | 3 refills | Status: DC

## 2019-06-24 NOTE — Patient Instructions (Addendum)
If you have medicare related insurance (such as traditional Medicare, Blue [employer], [employer], or similar), Please make an appointment at the scheduling desk with Sharee Pimple, the [employer], for your Wellness visit in this office, which is a benefit with your insurance.   Stop Glimepiride. Start Prandin    These suggestions will probably help you to improve your metabolism if you are not overweight and to lose weight if you are overweight: 1.  Reduce your consumption of sugars and starches.  Eliminate high fructose corn syrup from your diet.  Reduce your consumption of processed foods.  For desserts try to have seasonal fruits, berries, nuts, cheeses or dark chocolate with more than 70% cacao. 2.  Do not snack 3.  You do not have to eat breakfast.  If you choose to have breakfast-eat plain greek yogurt, eggs, oatmeal (without sugar) 4.  Drink water, freshly brewed unsweetened tea (green, black or herbal) or coffee.  Do not drink sodas including diet sodas , juices, beverages sweetened with artificial sweeteners. 5.  Reduce your consumption of refined grains. 6.  Avoid protein drinks such as Optifast, Slim fast etc. Eat chicken, fish, meat, dairy and beans for your sources of protein 7.  Natural unprocessed fats like cold pressed virgin olive oil, butter, coconut oil are good for you.  Eat avocados 8.  Increase your consumption of fiber.  Fruits, berries, vegetables, whole grains, flaxseeds, Chia seeds, beans, popcorn, nuts, oatmeal are good sources of fiber 9.  Use vinegar in your diet, i.e. apple cider vinegar, red wine or balsamic vinegar 10.  You can try fasting.  For example you can skip breakfast and lunch every other day (24-hour fast) 11.  Stress reduction, good night sleep, relaxation, meditation, yoga and other physical activity is likely to help you to maintain low weight too. 12.  If you drink alcohol, limit your alcohol intake to no more than 2 drinks a  day.

## 2019-06-24 NOTE — Assessment & Plan Note (Signed)
Increase Coreg to 25 mg twice a day and hydralazine to 50 mg 3 times a day. NAS diet Loose wt

## 2019-06-24 NOTE — Assessment & Plan Note (Signed)
Chronic Wt loss

## 2019-06-24 NOTE — Assessment & Plan Note (Signed)
Metformin, Glimepiride - d/c, Prandin

## 2019-06-24 NOTE — Progress Notes (Signed)
Subjective:  Patient ID: Larry [name], male    DOB: [birth year]  Age: 78 y.o. MRN: [id number]  CC: No chief complaint on file.   HPI Larry [name] presents for fatigue, DM, edema f/u  Outpatient Medications Prior to Visit  Medication Sig Dispense Refill  . ACCU-CHEK FASTCLIX LANCETS MISC 1 each by Does not apply route 2 (two) times daily. Dx:E11.9 100 each 11  . aspirin 325 MG tablet Take 1 tablet (325 mg total) by mouth daily. 60 tablet 0  . carvedilol (COREG) 25 MG tablet Take 1 tablet (25 mg total) by mouth 2 (two) times daily. 180 tablet 3  . Cholecalciferol (VITAMIN D3) 1000 UNITS CAPS Take 1,000 Units by mouth daily.     . folic acid (FOLVITE) 1 MG tablet Take 1 tablet (1 mg total) by mouth daily. 90 tablet 3  . glimepiride (AMARYL) 2 MG tablet TAKE 1 TABLET BY MOUTH TWICE DAILY (Patient taking differently: Take 2 mg by mouth 2 (two) times daily. ) 180 tablet 3  . glucose monitoring kit (FREESTYLE) monitoring kit 1 each by Does not apply route as needed for other. Please dispense brand of choice per ins/patient. Dx: 250.00. 1 each 0  . hydrALAZINE (APRESOLINE) 50 MG tablet Take 1 tablet (50 mg total) by mouth 3 (three) times daily. 270 tablet 3  . losartan (COZAAR) 100 MG tablet Take 1 tablet (100 mg total) by mouth daily. HOLD UNTIL SEEN BY YOUR PCP 90 tablet 3  . metFORMIN (GLUCOPHAGE) 1000 MG tablet Take 1 tablet (1,000 mg total) by mouth 2 (two) times daily with a meal. 180 tablet 3  . Multiple Vitamins-Minerals (MACULAR VITAMIN BENEFIT) TABS Take 1 tablet by mouth 2 (two) times daily.    . nitroGLYCERIN (NITROSTAT) 0.4 MG SL tablet Place 1 tablet (0.4 mg total) under the tongue every 5 (five) minutes as needed for chest pain. 20 tablet 3  . simvastatin (ZOCOR) 40 MG tablet Take 1 tablet (40 mg total) by mouth every evening. 90 tablet 3  . thiamine 100 MG tablet Take 1 tablet (100 mg total) by mouth daily. 90 tablet 3  . furosemide (LASIX) 20 MG tablet Take 1 tablet (20 mg  total) by mouth daily. (Patient taking differently: Take 10 mg by mouth daily. ) 90 tablet 3   No facility-administered medications prior to visit.     ROS: Review of Systems  Constitutional: Positive for fatigue. Negative for appetite change and unexpected weight change.  HENT: Negative for congestion, nosebleeds, sneezing, sore throat and trouble swallowing.   Eyes: Negative for itching and visual disturbance.  Respiratory: Negative for cough.   Cardiovascular: Positive for leg swelling. Negative for chest pain and palpitations.  Gastrointestinal: Negative for abdominal distention, blood in stool, diarrhea and nausea.  Genitourinary: Negative for frequency and hematuria.  Musculoskeletal: Negative for back pain, gait problem, joint swelling and neck pain.  Skin: Negative for rash.  Neurological: Negative for dizziness, tremors, speech difficulty and weakness.  Psychiatric/Behavioral: Negative for agitation, dysphoric mood and sleep disturbance. The patient is not nervous/anxious.     Objective:  Ht 6' (1.829 m)   Wt 256 lb (116.1 kg)   BMI 34.72 kg/m   BP Readings from Last 3 Encounters:  05/11/19 (!) 152/62  03/27/19 136/70  03/24/19 (!) 150/90    Wt Readings from Last 3 Encounters:  06/24/19 256 lb (116.1 kg)  05/11/19 255 lb (115.7 kg)  03/24/19 256 lb (116.1 kg)    Physical Exam  Constitutional:      General: He is not in acute distress.    Appearance: He is well-developed. He is obese.     Comments: NAD  Eyes:     Conjunctiva/sclera: Conjunctivae normal.     Pupils: Pupils are equal, round, and reactive to light.  Neck:     Musculoskeletal: Normal range of motion.     Thyroid: No thyromegaly.     Vascular: No JVD.  Cardiovascular:     Rate and Rhythm: Normal rate and regular rhythm.     Heart sounds: Normal heart sounds. No murmur. No friction rub. No gallop.   Pulmonary:     Effort: Pulmonary effort is normal. No respiratory distress.     Breath sounds:  Normal breath sounds. No wheezing or rales.  Chest:     Chest wall: No tenderness.  Abdominal:     General: Bowel sounds are normal. There is no distension.     Palpations: Abdomen is soft. There is no mass.     Tenderness: There is no abdominal tenderness. There is no guarding or rebound.  Musculoskeletal: Normal range of motion.        General: Swelling present. No tenderness.  Lymphadenopathy:     Cervical: No cervical adenopathy.  Skin:    General: Skin is warm and dry.     Findings: No rash.  Neurological:     Mental Status: He is alert.     Cranial Nerves: No cranial nerve deficit.     Motor: No abnormal muscle tone.     Coordination: Coordination normal.     Gait: Gait normal.     Deep Tendon Reflexes: Reflexes are normal and symmetric.  Psychiatric:        Behavior: Behavior normal.        Thought Content: Thought content normal.        Judgment: Judgment normal.   1+ B edema  Lab Results  Component Value Date   WBC 10.4 01/28/2019   HGB 10.7 (L) 01/28/2019   HCT 32.3 (L) 01/28/2019   PLT 239.0 01/28/2019   GLUCOSE 74 01/28/2019   CHOL 129 11/20/2018   TRIG 113 11/20/2018   HDL 38 (L) 11/20/2018   LDLCALC 68 11/20/2018   ALT 12 01/28/2019   AST 13 01/28/2019   NA 141 01/28/2019   K 4.4 01/28/2019   CL 106 01/28/2019   CREATININE 1.27 01/28/2019   BUN 30 (H) 01/28/2019   CO2 27 01/28/2019   TSH 2.19 03/25/2018   PSA 0.00 Repeated and verified X2. (L) 12/26/2012   INR 1.0 11/19/2018   HGBA1C 6.7 (H) 01/28/2019    Mr Brain W KG Contrast  Result Date: 03/15/2019  [facility] [address], [place], [place] [address] [phone] NEUROIMAGING REPORT STUDY DATE: 03/14/2019 PATIENT NAME: Larry [name] DOB: May 17, 1941 MRN: [id number] EXAM: MRI Brain with and without contrast ORDERING CLINICIAN: Antony Contras, MD CLINICAL HISTORY: 78 year old man with stroke COMPARISON FILMS: CT 11/19/2018 TECHNIQUE:MRI of the brain with and without contrast  was obtained utilizing 5 mm axial slices with T1, T2, T2 flair, SWI and diffusion weighted views.  T1 sagittal, T2 coronal and postcontrast views in the axial and coronal plane were obtained. CONTRAST: 20 ml Multihance IMAGING SITE: [facility], [place]. FINDINGS: On sagittal images, the spinal cord is imaged caudally to C2-C3 and is normal in caliber.   The contents of the posterior fossa are of normal size and position.  The pituitary gland and optic chiasm appear normal.    There is mild generalized cortical atrophy.  There are no abnormal extra-axial collections of fluid.  The cerebellum and brainstem appears normal.   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  There are some scattered T2/flair hyperintense foci in the subcortical and deep white matter of the hemispheres.  None of these appear to be acute..   Diffusion weighted images are normal.  Susceptibility weighted images show a single chronic microhemorrhage in the superior left occipital lobe..   The VIIth/VIIIth nerve complex appears normal.  There have been bilateral lens replacements.  The orbits are otherwise normal.  The mastoid air cells appear normal.  The paranasal sinuses appear normal.  Flow voids are identified within the major intracerebral arteries.  After the infusion of contrast material, a normal enhancement pattern is noted.   This MRI of the brain with and without contrast shows the following: 1.   Mild generalized cortical atrophy. 2.   Chronic right basal ganglia lacune that was also chronic on the 11/19/2018 CT scan. 3.   Mild chronic microvascular ischemic changes 4.   Single nonspecific chronic microhemorrhage in the superior left occipital lobe. 5.   There are no acute findings and there is a normal enhancement pattern. INTERPRETING PHYSICIAN: Richard A. Felecia Shelling, MD, PhD, FAAN Certified in  Neuroimaging by [employer]     Assessment & Plan:   There are no diagnoses linked to this encounter.   No orders of the defined types were placed in this encounter.    Follow-up: No follow-ups on file.  Walker Kehr, MD

## 2019-06-24 NOTE — Assessment & Plan Note (Signed)
F/u w/Dr Leonie Man Cont w/ASA

## 2019-06-25 LAB — IRON,TIBC AND FERRITIN PANEL
%SAT: 25 % (calc) (ref 20–48)
Ferritin: 17 ng/mL — ABNORMAL LOW (ref 24–380)
Iron: 103 ug/dL (ref 50–180)
TIBC: 408 mcg/dL (calc) (ref 250–425)

## 2019-06-25 NOTE — Addendum Note (Signed)
Addended by: Karren Cobble on: 06/25/2019 08:24 AM   Modules accepted: Orders

## 2019-06-30 ENCOUNTER — Other Ambulatory Visit: Payer: Self-pay | Admitting: Interventional Cardiology

## 2019-07-10 ENCOUNTER — Ambulatory Visit: Payer: Self-pay

## 2019-07-10 NOTE — Telephone Encounter (Signed)
Patient wife called and states that her husbands leges rt and left are swollen into his feet.  She states she is worried because they look red and hare warm to touch.  She states that his right lower leg has a chronic sore. His left leg has an injury that happed several weeks ago and will not heal.  Patient has hx of heart Dx and diabetes. He has no fever. He has no pain. Care advice read to patient's wife.  She verbalized understanding. Call placed to office for scheduling but was unable to speak with scheduling x 3 attempts.  Note will be routed to office for follow up and scheduling. Patients wife is aware.  Reason for Disposition . [1] MODERATE leg swelling (e.g., swelling extends up to knees) AND [2] new onset or worsening  Answer Assessment - Initial Assessment Questions 1. ONSET: "When did the swelling start?" (e.g., minutes, hours, days)     A while 2. LOCATION: "What part of the leg is swollen?"  "Are both legs swollen or just one leg?"     Both below the knee down 3. SEVERITY: "How bad is the swelling?" (e.g., localized; mild, moderate, severe)  - Localized - small area of swelling localized to one leg  - MILD pedal edema - swelling limited to foot and ankle, pitting edema < 1/4 inch (6 mm) deep, rest and elevation eliminate most or all swelling  - MODERATE edema - swelling of lower leg to knee, pitting edema > 1/4 inch (6 mm) deep, rest and elevation only partially reduce swelling  - SEVERE edema - swelling extends above knee, facial or hand swelling present     moderate wears compression socks 4. REDNESS: "Does the swelling look red or infected?"     Yes and sore on leg left leg that has not healed rt leg sore that is chronic 5. PAIN: "Is the swelling painful to touch?" If so, ask: "How painful is it?"   (Scale 1-10; mild, moderate or severe)     no 6. FEVER: "Do you have a fever?" If so, ask: "What is it, how was it measured, and when did it start?"      no 7. CAUSE: "What do you  think is causing the leg swelling?"     Heart condition 8. MEDICAL HISTORY: "Do you have a history of heart failure, kidney disease, liver failure, or cancer?"   Heart surgery 9. RECURRENT SYMPTOM: "Have you had leg swelling before?" If so, ask: "When was the last time?" "What happened that time?"    Paced on medication 10. OTHER SYMPTOMS: "Do you have any other symptoms?" (e.g., chest pain, difficulty breathing)      Red warn to touch legs 11. PREGNANCY: "Is there any chance you are pregnant?" "When was your last menstrual period?"      N/A  Protocols used: LEG SWELLING AND EDEMA-A-AH

## 2019-07-10 NOTE — Telephone Encounter (Signed)
Appointment scheduled for Monday. Patient's wife preferred to wait until Monday.

## 2019-07-13 ENCOUNTER — Encounter: Payer: Self-pay | Admitting: Internal Medicine

## 2019-07-13 ENCOUNTER — Ambulatory Visit (INDEPENDENT_AMBULATORY_CARE_PROVIDER_SITE_OTHER): Payer: Medicare Other | Admitting: Internal Medicine

## 2019-07-13 ENCOUNTER — Other Ambulatory Visit: Payer: Self-pay

## 2019-07-13 VITALS — BP 128/62 | HR 71 | Temp 98.2°F | Ht 72.0 in | Wt 255.0 lb

## 2019-07-13 DIAGNOSIS — I639 Cerebral infarction, unspecified: Secondary | ICD-10-CM | POA: Diagnosis not present

## 2019-07-13 DIAGNOSIS — I25709 Atherosclerosis of coronary artery bypass graft(s), unspecified, with unspecified angina pectoris: Secondary | ICD-10-CM | POA: Diagnosis not present

## 2019-07-13 DIAGNOSIS — R600 Localized edema: Secondary | ICD-10-CM | POA: Diagnosis not present

## 2019-07-13 DIAGNOSIS — I872 Venous insufficiency (chronic) (peripheral): Secondary | ICD-10-CM

## 2019-07-13 DIAGNOSIS — Z23 Encounter for immunization: Secondary | ICD-10-CM

## 2019-07-13 MED ORDER — TRIAMCINOLONE ACETONIDE 0.1 % EX OINT: 1.0000 "application " | TOPICAL_OINTMENT | Freq: Two times a day (BID) | CUTANEOUS | 2 refills | Status: DC

## 2019-07-13 MED ORDER — FUROSEMIDE 20 MG PO TABS: 20.0000 mg | ORAL_TABLET | Freq: Every day | ORAL | 3 refills | Status: DC

## 2019-07-13 NOTE — Progress Notes (Signed)
 Subjective:  Patient ID: Larry [name], male    DOB: 12/30/1940  Age: 78 y.o. MRN: [id number]  CC: No chief complaint on file.   HPI Larry [name] presents for B LE swelling C/o sore on LLE  Outpatient Medications Prior to Visit  Medication Sig Dispense Refill  . ACCU-CHEK FASTCLIX LANCETS MISC 1 each by Does not apply route 2 (two) times daily. Dx:E11.9 100 each 11  . aspirin 325 MG tablet Take 1 tablet (325 mg total) by mouth daily. 60 tablet 0  . carvedilol (COREG) 25 MG tablet Take 1 tablet (25 mg total) by mouth 2 (two) times daily. 180 tablet 3  . Cholecalciferol (VITAMIN D3) 1000 UNITS CAPS Take 1,000 Units by mouth daily.     . folic acid (FOLVITE) 1 MG tablet Take 1 tablet (1 mg total) by mouth daily. 90 tablet 3  . furosemide (LASIX) 20 MG tablet Take 1 tablet by mouth once daily 90 tablet 1  . glucose monitoring kit (FREESTYLE) monitoring kit 1 each by Does not apply route as needed for other. Please dispense brand of choice per ins/patient. Dx: 250.00. 1 each 0  . hydrALAZINE (APRESOLINE) 50 MG tablet Take 1 tablet (50 mg total) by mouth 3 (three) times daily. 270 tablet 3  . losartan (COZAAR) 100 MG tablet Take 1 tablet (100 mg total) by mouth daily. HOLD UNTIL SEEN BY YOUR PCP 90 tablet 3  . metFORMIN (GLUCOPHAGE) 1000 MG tablet Take 1 tablet (1,000 mg total) by mouth 2 (two) times daily with a meal. 180 tablet 3  . Multiple Vitamins-Minerals (MACULAR VITAMIN BENEFIT) TABS Take 1 tablet by mouth 2 (two) times daily.    . nitroGLYCERIN (NITROSTAT) 0.4 MG SL tablet Place 1 tablet (0.4 mg total) under the tongue every 5 (five) minutes as needed for chest pain. 20 tablet 3  . repaglinide (PRANDIN) 1 MG tablet Take 1 tablet (1 mg total) by mouth 3 (three) times daily before meals. 270 tablet 3  . simvastatin (ZOCOR) 40 MG tablet Take 1 tablet (40 mg total) by mouth every evening. 90 tablet 3  . thiamine 100 MG tablet Take 1 tablet (100 mg total) by mouth daily. 90 tablet 3   No facility-administered medications prior to visit.     ROS: Review of Systems  Constitutional: Negative for appetite change, fatigue and unexpected weight change.  HENT: Negative for congestion, nosebleeds, sneezing, sore throat and trouble swallowing.   Eyes: Negative for itching and visual disturbance.  Respiratory: Negative for cough.   Cardiovascular: Positive for leg swelling. Negative for chest pain and palpitations.  Gastrointestinal: Negative for abdominal distention, blood in stool, diarrhea and nausea.  Genitourinary: Negative for frequency and hematuria.  Musculoskeletal: Negative for back pain, gait problem, joint swelling and neck pain.  Skin: Negative for rash.  Neurological: Negative for dizziness, tremors, speech difficulty and weakness.  Psychiatric/Behavioral: Negative for agitation, dysphoric mood, sleep disturbance and suicidal ideas. The patient is not nervous/anxious.     Objective:  BP 128/62 (BP Location: Left Arm, Patient Position: Sitting, Cuff Size: Large)   Pulse 71   Temp 98.2 F (36.8 C) (Oral)   Ht 6' (1.829 m)   Wt 255 lb (115.7 kg)   SpO2 96%   BMI 34.58 kg/m   BP Readings from Last 3 Encounters:  07/13/19 128/62  06/24/19 138/64  05/11/19 (!) 152/62    Wt Readings from Last 3 Encounters:  07/13/19 255 lb (115.7 kg)  06/24/19 256 lb (  116.1 kg)  05/11/19 255 lb (115.7 kg)    Physical Exam Constitutional:      General: He is not in acute distress.    Appearance: He is well-developed.     Comments: NAD  Eyes:     Conjunctiva/sclera: Conjunctivae normal.     Pupils: Pupils are equal, round, and reactive to light.  Neck:     Musculoskeletal: Normal range of motion.     Thyroid: No thyromegaly.     Vascular: No JVD.  Cardiovascular:     Rate and Rhythm: Normal rate and regular rhythm.     Heart sounds: Normal heart sounds. No murmur. No friction rub. No gallop.   Pulmonary:     Effort: Pulmonary effort is normal. No respiratory  distress.     Breath sounds: Normal breath sounds. No wheezing or rales.  Chest:     Chest wall: No tenderness.  Abdominal:     General: Bowel sounds are normal. There is no distension.     Palpations: Abdomen is soft. There is no mass.     Tenderness: There is no abdominal tenderness. There is no guarding or rebound.  Musculoskeletal: Normal range of motion.        General: No tenderness.     Right lower leg: Edema present.     Left lower leg: Edema present.  Lymphadenopathy:     Cervical: No cervical adenopathy.  Skin:    General: Skin is warm and dry.     Findings: No rash.  Neurological:     Mental Status: He is alert and oriented to person, place, and time.     Cranial Nerves: No cranial nerve deficit.     Motor: No abnormal muscle tone.     Coordination: Coordination normal.     Gait: Gait normal.     Deep Tendon Reflexes: Reflexes are normal and symmetric.  Psychiatric:        Behavior: Behavior normal.        Thought Content: Thought content normal.        Judgment: Judgment normal.    2+ B legs edema at ankles LLE distal 1.5x1.5 cm healing wound  Lab Results  Component Value Date   WBC 9.2 06/24/2019   HGB 12.1 (L) 06/24/2019   HCT 37.0 (L) 06/24/2019   PLT 236.0 06/24/2019   GLUCOSE 74 01/28/2019   CHOL 129 11/20/2018   TRIG 113 11/20/2018   HDL 38 (L) 11/20/2018   LDLCALC 68 11/20/2018   ALT 12 01/28/2019   AST 13 01/28/2019   NA 141 01/28/2019   K 4.4 01/28/2019   CL 106 01/28/2019   CREATININE 1.27 01/28/2019   BUN 30 (H) 01/28/2019   CO2 27 01/28/2019   TSH 2.18 06/24/2019   PSA 0.00 Repeated and verified X2. (L) 12/26/2012   INR 1.0 11/19/2018   HGBA1C 6.6 (H) 06/24/2019    Mr Brain W Wo Contrast  Result Date: 03/15/2019  [facility] [address], [address] [place], [place] [address] [phone] NEUROIMAGING REPORT STUDY DATE: 03/14/2019 PATIENT NAME: Larry [name] DOB: 03/24/1941 MRN: [id number] EXAM: MRI Brain with and  without contrast ORDERING CLINICIAN: Pramod Sethi, MD CLINICAL HISTORY: 78-year-old man with stroke COMPARISON FILMS: CT 11/19/2018 TECHNIQUE:MRI of the brain with and without contrast was obtained utilizing 5 mm axial slices with T1, T2, T2 flair, SWI and diffusion weighted views.  T1 sagittal, T2 coronal and postcontrast views in the axial and coronal plane were obtained. CONTRAST: 20   ml Multihance IMAGING SITE: [facility], [place]. FINDINGS: On sagittal images, the spinal cord is imaged caudally to C2-C3 and is normal in caliber.   The contents of the posterior fossa are of normal size and position.   The pituitary gland and optic chiasm appear normal.    There is mild generalized cortical atrophy.  There are no abnormal extra-axial collections of fluid.  The cerebellum and brainstem appears normal.   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  There are some scattered T2/flair hyperintense foci in the subcortical and deep white matter of the hemispheres.  None of these appear to be acute..   Diffusion weighted images are normal.  Susceptibility weighted images show a single chronic microhemorrhage in the superior left occipital lobe..   The VIIth/VIIIth nerve complex appears normal.  There have been bilateral lens replacements.  The orbits are otherwise normal.  The mastoid air cells appear normal.  The paranasal sinuses appear normal.  Flow voids are identified within the major intracerebral arteries.  After the infusion of contrast material, a normal enhancement pattern is noted.   This MRI of the brain with and without contrast shows the following: 1.   Mild generalized cortical atrophy. 2.   Chronic right basal ganglia lacune that was also chronic on the 11/19/2018 CT scan. 3.   Mild chronic microvascular ischemic changes 4.   Single nonspecific chronic microhemorrhage in the superior left occipital lobe. 5.   There are  no acute findings and there is a normal enhancement pattern. INTERPRETING PHYSICIAN: Richard A. Felecia Shelling, MD, PhD, FAAN Certified in  Neuroimaging by [employer]    Assessment & Plan:   There are no diagnoses linked to this encounter.   No orders of the defined types were placed in this encounter.    Follow-up: No follow-ups on file.  Walker Kehr, MD

## 2019-07-13 NOTE — Assessment & Plan Note (Signed)
Worse: 2+ B legs edema at ankles LLE distal 1.5x1.5 cm healing wound Increase Lasix dose Compression socks [facility] ref Triamc for the wound

## 2019-07-13 NOTE — Addendum Note (Signed)
Addended by: Karren Cobble on: 07/13/2019 04:45 PM   Modules accepted: Orders

## 2019-08-08 ENCOUNTER — Other Ambulatory Visit: Payer: Self-pay | Admitting: Internal Medicine

## 2019-09-03 ENCOUNTER — Emergency Department (HOSPITAL_BASED_OUTPATIENT_CLINIC_OR_DEPARTMENT_OTHER)
Admission: EM | Admit: 2019-09-03 | Discharge: 2019-09-03 | Disposition: A | Discharge status: Home / Self Care | Payer: Medicare Other | Attending: Emergency Medicine | Admitting: Emergency Medicine

## 2019-09-03 ENCOUNTER — Other Ambulatory Visit: Payer: Self-pay

## 2019-09-03 ENCOUNTER — Emergency Department (HOSPITAL_BASED_OUTPATIENT_CLINIC_OR_DEPARTMENT_OTHER): Payer: Medicare Other

## 2019-09-03 ENCOUNTER — Encounter (HOSPITAL_BASED_OUTPATIENT_CLINIC_OR_DEPARTMENT_OTHER): Payer: Self-pay

## 2019-09-03 DIAGNOSIS — Y999 Unspecified external cause status: Secondary | ICD-10-CM | POA: Insufficient documentation

## 2019-09-03 DIAGNOSIS — M25572 Pain in left ankle and joints of left foot: Secondary | ICD-10-CM | POA: Diagnosis not present

## 2019-09-03 DIAGNOSIS — Z8546 Personal history of malignant neoplasm of prostate: Secondary | ICD-10-CM | POA: Diagnosis not present

## 2019-09-03 DIAGNOSIS — Y9389 Activity, other specified: Secondary | ICD-10-CM | POA: Diagnosis not present

## 2019-09-03 DIAGNOSIS — E119 Type 2 diabetes mellitus without complications: Secondary | ICD-10-CM | POA: Insufficient documentation

## 2019-09-03 DIAGNOSIS — Z7982 Long term (current) use of aspirin: Secondary | ICD-10-CM | POA: Diagnosis not present

## 2019-09-03 DIAGNOSIS — I110 Hypertensive heart disease with heart failure: Secondary | ICD-10-CM | POA: Diagnosis not present

## 2019-09-03 DIAGNOSIS — Z79899 Other long term (current) drug therapy: Secondary | ICD-10-CM | POA: Insufficient documentation

## 2019-09-03 DIAGNOSIS — Y92410 Unspecified street and highway as the place of occurrence of the external cause: Secondary | ICD-10-CM | POA: Diagnosis not present

## 2019-09-03 DIAGNOSIS — I5042 Chronic combined systolic (congestive) and diastolic (congestive) heart failure: Secondary | ICD-10-CM | POA: Diagnosis not present

## 2019-09-03 DIAGNOSIS — S8992XA Unspecified injury of left lower leg, initial encounter: Secondary | ICD-10-CM | POA: Diagnosis not present

## 2019-09-03 DIAGNOSIS — Z951 Presence of aortocoronary bypass graft: Secondary | ICD-10-CM | POA: Diagnosis not present

## 2019-09-03 DIAGNOSIS — M25562 Pain in left knee: Secondary | ICD-10-CM | POA: Diagnosis not present

## 2019-09-03 DIAGNOSIS — Z7984 Long term (current) use of oral hypoglycemic drugs: Secondary | ICD-10-CM | POA: Diagnosis not present

## 2019-09-03 DIAGNOSIS — S8392XA Sprain of unspecified site of left knee, initial encounter: Secondary | ICD-10-CM | POA: Insufficient documentation

## 2019-09-03 DIAGNOSIS — S93402A Sprain of unspecified ligament of left ankle, initial encounter: Secondary | ICD-10-CM | POA: Diagnosis not present

## 2019-09-03 DIAGNOSIS — R600 Localized edema: Secondary | ICD-10-CM | POA: Diagnosis not present

## 2019-09-03 DIAGNOSIS — I2510 Atherosclerotic heart disease of native coronary artery without angina pectoris: Secondary | ICD-10-CM | POA: Diagnosis not present

## 2019-09-03 DIAGNOSIS — M79662 Pain in left lower leg: Secondary | ICD-10-CM | POA: Diagnosis not present

## 2019-09-03 NOTE — ED Provider Notes (Signed)
[facility] EMERGENCY DEPARTMENT Provider Note   CSN: [id number] Arrival date & time: 09/03/19  1132     History Chief Complaint  Patient presents with  . Leg Injury    Larry [name] is a 79 y.o. male.  Patient is a 79 year old male with past medical history of TIA, sleep apnea, hyperlipidemia.  He presents today for evaluation of a leg injury.  Patient reports falling while getting into his car yesterday.  He slid under the car and twisted his left ankle and lower leg.  He describes ongoing weakness and occasional episodes where his "leg gives out" since July 2020.  He experienced an accident during which he overturned his riding lawnmower and it landed on top of him.  The history is provided by the patient.       Past Medical History:  Diagnosis Date  . Carotid stenosis, left   . Cataracts, bilateral    removed  . Coronary artery disease   . Hyperlipemia   . Prostate CA ([place])   . Sleep apnea    Dr Ledora Bottcher ; uses CPAP  . Stroke ([place])   . Type II or unspecified type diabetes mellitus without mention of complication, not stated as uncontrolled   . Unspecified essential hypertension     Patient Active Problem List   Diagnosis Date Noted  . TIA (transient ischemic attack) 06/24/2019  . Hip pain, acute, left 03/24/2019  . Morbid (severe) obesity with alveolar hypoventilation ([place]) 01/13/2019  . Cerebral embolism with cerebral infarction 11/20/2018  . Close exposure to COVID-19 virus 11/19/2018  . Acute respiratory failure with hypoxemia ([place]) 11/19/2018  . Upper respiratory infection 09/22/2018  . Chronic combined systolic and diastolic heart failure ([place]) 08/25/2017  . Carotid stenosis 08/22/2017  . Coronary artery disease involving coronary bypass graft of native heart with angina pectoris ([place]) 06/27/2017  . DOE (dyspnea on exertion) 06/18/2017  . Acute encephalopathy 11/21/2016  . Sciatic radiculitis 03/23/2016  . Edema 04/23/2012  . OBSTRUCTIVE SLEEP  APNEA 10/23/2010  . LOW BACK PAIN, ACUTE 07/19/2009  . DM2 (diabetes mellitus, type 2) ([place]) 06/24/2007  . Dyslipidemia 06/24/2007  . Essential hypertension 06/24/2007  . PROSTATE CANCER, HX OF 06/24/2007    Past Surgical History:  Procedure Laterality Date  . APPENDECTOMY    . CARDIAC CATHETERIZATION    . CATARACT EXTRACTION, BILATERAL    . CORONARY ARTERY BYPASS GRAFT N/A 06/27/2017   Procedure: CORONARY ARTERY BYPASS GRAFTING (CABG), time four   using the left internal mammary artery, and right saphenous leg vein harvested endoscopically.;  Surgeon: Ivin Poot, MD;  Location: [facility];  Service: Open Heart Surgery;  Laterality: N/A;  . CYSTOSCOPY N/A 06/27/2017   Procedure: CYSTOSCOPY FLEXIBLE, Balloon dilatation placement of foley catheter;  Surgeon: Ivin Poot, MD;  Location: [facility];  Service: Open Heart Surgery;  Laterality: N/A;  . ENDARTERECTOMY Left 08/22/2017   Procedure: ENDARTERECTOMY CAROTID LEFT;  Surgeon: Serafina Mitchell, MD;  Location: [facility];  Service: Vascular;  Laterality: Left;  . INTRAVASCULAR ULTRASOUND/IVUS N/A 06/24/2017   Procedure: Intravascular Ultrasound/IVUS;  Surgeon: Belva Crome, MD;  Location: [facility] CV LAB;  Service: Cardiovascular;  Laterality: N/A;  LEFT MAIN  . LEFT HEART CATH AND CORONARY ANGIOGRAPHY N/A 06/24/2017   Procedure: LEFT HEART CATH AND CORONARY ANGIOGRAPHY;  Surgeon: Belva Crome, MD;  Location: [facility] CV LAB;  Service: Cardiovascular;  Laterality: N/A;  . PATCH ANGIOPLASTY Left 08/22/2017   Procedure: PATCH ANGIOPLASTY USING  Rueben Bash BIOLOGIC PATCH;  Surgeon: Serafina Mitchell, MD;  Location: [facility];  Service: Vascular;  Laterality: Left;  . PROSTATECTOMY    . TEE WITHOUT CARDIOVERSION N/A 06/27/2017   Procedure: TRANSESOPHAGEAL ECHOCARDIOGRAM (TEE);  Surgeon: Prescott Gum, Collier Salina, MD;  Location: [facility];  Service: Open Heart Surgery;  Laterality: N/A;  . TONSILLECTOMY    . ULTRASOUND GUIDANCE FOR VASCULAR ACCESS  06/24/2017    Procedure: Ultrasound Guidance For Vascular Access;  Surgeon: Belva Crome, MD;  Location: [facility] CV LAB;  Service: Cardiovascular;;       Family History  Problem Relation Age of Onset  . Heart disease Mother   . Diabetes Other   . Hypertension Other   . Colon cancer Neg Hx     Social History   Tobacco Use  . Smoking status: Never Smoker  . Smokeless tobacco: Never Used  Substance Use Topics  . Alcohol use: No    Alcohol/week: 0.0 standard drinks  . Drug use: No    Home Medications Prior to Admission medications   Medication Sig Start Date End Date Taking? Authorizing Provider  Accu-Chek FastClix Lancets MISC USE 1  TO CHECK GLUCOSE TWICE DAILY 08/19/19   Plotnikov, [name], MD  aspirin 325 MG tablet Take 1 tablet (325 mg total) by mouth daily. 11/24/18   Desiree Hane, MD  carvedilol (COREG) 25 MG tablet Take 1 tablet (25 mg total) by mouth 2 (two) times daily. 01/13/19 01/13/20  Plotnikov, [name], MD  Cholecalciferol (VITAMIN D3) 1000 UNITS CAPS Take 1,000 Units by mouth daily.     [provider]  folic acid (FOLVITE) 1 MG tablet Take 1 tablet (1 mg total) by mouth daily. 03/27/19   Plotnikov, [name], MD  furosemide (LASIX) 20 MG tablet Take 1-2 tablets (20-40 mg total) by mouth daily. 07/13/19   Plotnikov, [name], MD  glucose monitoring kit (FREESTYLE) monitoring kit 1 each by Does not apply route as needed for other. Please dispense brand of choice per ins/patient. Dx: 250.00. 05/08/13   Plotnikov, [name], MD  hydrALAZINE (APRESOLINE) 50 MG tablet Take 1 tablet (50 mg total) by mouth 3 (three) times daily. 01/13/19   Plotnikov, [name], MD  losartan (COZAAR) 100 MG tablet Take 1 tablet (100 mg total) by mouth daily. HOLD UNTIL SEEN BY YOUR PCP 11/24/18   [name] D, MD  metFORMIN (GLUCOPHAGE) 1000 MG tablet Take 1 tablet (1,000 mg total) by mouth 2 (two) times daily with a meal. 03/24/19   Plotnikov, [name], MD  Multiple Vitamins-Minerals  (MACULAR VITAMIN BENEFIT) TABS Take 1 tablet by mouth 2 (two) times daily.    [provider]  nitroGLYCERIN (NITROSTAT) 0.4 MG SL tablet Place 1 tablet (0.4 mg total) under the tongue every 5 (five) minutes as needed for chest pain. 03/27/19   Plotnikov, [name], MD  repaglinide (PRANDIN) 1 MG tablet Take 1 tablet (1 mg total) by mouth 3 (three) times daily before meals. 06/24/19   Plotnikov, [name], MD  simvastatin (ZOCOR) 40 MG tablet Take 1 tablet (40 mg total) by mouth every evening. 03/24/19   Plotnikov, [name], MD  thiamine 100 MG tablet Take 1 tablet (100 mg total) by mouth daily. 06/24/19   Plotnikov, [name], MD  triamcinolone ointment (KENALOG) 0.1 % Apply 1 application topically 2 (two) times daily. On leg sores 07/13/19   Plotnikov, [name], MD    Allergies    Amlodipine, Verapamil, Clindamycin hcl, and  Contrast media [iodinated diagnostic agents]  Review of Systems   Review of Systems  All other systems reviewed and are negative.   Physical Exam Updated Vital Signs BP (!) 121/56 (BP Location: Right Arm)   Pulse 70   Temp 97.9 F (36.6 C) (Oral)   Resp 16   Ht 6' (1.829 m)   Wt 108.9 kg   SpO2 98%   BMI 32.55 kg/m   Physical Exam Vitals and nursing note reviewed.  Constitutional:      General: He is not in acute distress.    Appearance: Normal appearance. He is not ill-appearing.  HENT:     Head: Normocephalic and atraumatic.  Pulmonary:     Effort: Pulmonary effort is normal.  Musculoskeletal:     Comments: The left lower extremity has 1-2+ pitting edema.  There is tenderness over the distal leg/ankle.  DP pulses are palpable and motor/sensation are intact distally.  Skin:    General: Skin is warm and dry.  Neurological:     Mental Status: He is alert and oriented to person, place, and time.     ED Results / Procedures / Treatments   Labs (all labs ordered are listed, but only abnormal results are displayed) Labs Reviewed - No data to  display  EKG None  Radiology No results found.  Procedures Procedures (including critical care time)  Medications Ordered in ED Medications - No data to display  ED Course  I have reviewed the triage vital signs and the nursing notes.  Pertinent labs & imaging results that were available during my care of the patient were reviewed by me and considered in my medical decision making (see chart for details).    MDM Rules/Calculators/A&P  Patient presenting here after a slip and fall this morning.  He is complaining of pain in his left lower leg.  X-rays of his knee, tib-fib, and ankle are all negative.  Patient to follow-up with primary doctor if not improving in the next week.  He does report intermittent numbness and weakness in his left leg since being entrapped under his riding mower in July.  Patient advised to follow-up with his primary doctor regarding this.  He may require further imaging such as an MRI.  But this does not appear emergent, nor is it available at [facility] today.  Final Clinical Impression(s) / ED Diagnoses Final diagnoses:  None    Rx / DC Orders ED Discharge Orders    None       Veryl Speak, MD 09/03/19 1404

## 2019-09-03 NOTE — Discharge Instructions (Addendum)
Rest.  Elevate your leg is much as possible.  Elevate for 20 minutes every 2 hours while awake for the next 2 days.  Follow-up with your primary doctor if symptoms or not improving in the next 1 to 2 weeks.

## 2019-09-03 NOTE — ED Notes (Signed)
Pt. returned from XR. 

## 2019-09-03 NOTE — ED Triage Notes (Signed)
Pt states he fell last night getting out of a car-pain from "the ankle to to the hip"-pt NAD-slow gait with own crutch x 1

## 2019-09-07 ENCOUNTER — Other Ambulatory Visit: Payer: Self-pay

## 2019-09-07 DIAGNOSIS — I872 Venous insufficiency (chronic) (peripheral): Secondary | ICD-10-CM

## 2019-09-08 ENCOUNTER — Other Ambulatory Visit: Payer: Self-pay

## 2019-09-08 ENCOUNTER — Ambulatory Visit (HOSPITAL_COMMUNITY)
Admission: RE | Admit: 2019-09-08 | Discharge: 2019-09-08 | Disposition: A | Discharge status: Home / Self Care | Payer: Medicare Other | Source: Ambulatory Visit | Attending: Vascular Surgery | Admitting: Vascular Surgery

## 2019-09-08 ENCOUNTER — Encounter: Payer: Self-pay | Admitting: Vascular Surgery

## 2019-09-08 ENCOUNTER — Ambulatory Visit (INDEPENDENT_AMBULATORY_CARE_PROVIDER_SITE_OTHER): Payer: Medicare Other | Admitting: Vascular Surgery

## 2019-09-08 VITALS — BP 140/66 | HR 68 | Temp 97.6°F | Resp 20 | Ht 72.0 in | Wt 244.0 lb

## 2019-09-08 DIAGNOSIS — I872 Venous insufficiency (chronic) (peripheral): Secondary | ICD-10-CM

## 2019-09-08 NOTE — Progress Notes (Signed)
Vascular and Vein Specialist of [facility]  Patient name: Larry [name] MRN: [id number] DOB: Jan 28, 1941 Sex: male  REASON FOR VISIT: Evaluation lower extremity swelling  HPI: Larry [name] is a 79 y.o. male here today for follow-up of lower extremity swelling.  He is very pleasant gentleman who reports that this has been somewhat progressive although stabilized.  He has no history of DVT.  Does have a history of prior coronary artery bypass grafting with vein harvest from the right leg.  He also underwent left carotid endarterectomy with Dr. Trula Slade.  He has tried compression and increased diuresis.  Past Medical History:  Diagnosis Date  . Carotid stenosis, left   . Cataracts, bilateral    removed  . Coronary artery disease   . Hyperlipemia   . Prostate CA ([place])   . Sleep apnea    Dr Ledora Bottcher ; uses CPAP  . Stroke ([place])   . Type II or unspecified type diabetes mellitus without mention of complication, not stated as uncontrolled   . Unspecified essential hypertension     Family History  Problem Relation Age of Onset  . Heart disease Mother   . Diabetes Other   . Hypertension Other   . Colon cancer Neg Hx     SOCIAL HISTORY: Social History   Tobacco Use  . Smoking status: Never Smoker  . Smokeless tobacco: Never Used  Substance Use Topics  . Alcohol use: No    Alcohol/week: 0.0 standard drinks    Allergies  Allergen Reactions  . Amlodipine Swelling    Leg swelling  . Verapamil Swelling    Edema w/high dose  . Clindamycin Hcl     UNSPECIFIED REACTION   . Contrast Media [Iodinated Diagnostic Agents] Nausea And Vomiting    Current Outpatient Medications  Medication Sig Dispense Refill  . Accu-Chek FastClix Lancets MISC USE 1  TO CHECK GLUCOSE TWICE DAILY 102 each 0  . aspirin 325 MG tablet Take 1 tablet (325 mg total) by mouth daily. 60 tablet 0  . carvedilol (COREG) 25 MG tablet Take 1 tablet (25 mg total) by mouth 2 (two)  times daily. 180 tablet 3  . Cholecalciferol (VITAMIN D3) 1000 UNITS CAPS Take 1,000 Units by mouth daily.     . folic acid (FOLVITE) 1 MG tablet Take 1 tablet (1 mg total) by mouth daily. 90 tablet 3  . furosemide (LASIX) 20 MG tablet Take 1-2 tablets (20-40 mg total) by mouth daily. 180 tablet 3  . glucose monitoring kit (FREESTYLE) monitoring kit 1 each by Does not apply route as needed for other. Please dispense brand of choice per ins/patient. Dx: 250.00. 1 each 0  . hydrALAZINE (APRESOLINE) 50 MG tablet Take 1 tablet (50 mg total) by mouth 3 (three) times daily. 270 tablet 3  . losartan (COZAAR) 100 MG tablet Take 1 tablet (100 mg total) by mouth daily. HOLD UNTIL SEEN BY YOUR PCP 90 tablet 3  . metFORMIN (GLUCOPHAGE) 1000 MG tablet Take 1 tablet (1,000 mg total) by mouth 2 (two) times daily with a meal. 180 tablet 3  . Multiple Vitamins-Minerals (MACULAR VITAMIN BENEFIT) TABS Take 1 tablet by mouth 2 (two) times daily.    . nitroGLYCERIN (NITROSTAT) 0.4 MG SL tablet Place 1 tablet (0.4 mg total) under the tongue every 5 (five) minutes as needed for chest pain. 20 tablet 3  . repaglinide (PRANDIN) 1 MG tablet Take 1 tablet (1 mg total) by mouth 3 (three) times daily before meals.  270 tablet 3  . simvastatin (ZOCOR) 40 MG tablet Take 1 tablet (40 mg total) by mouth every evening. 90 tablet 3  . thiamine 100 MG tablet Take 1 tablet (100 mg total) by mouth daily. 90 tablet 3  . triamcinolone ointment (KENALOG) 0.1 % Apply 1 application topically 2 (two) times daily. On leg sores 80 g 2   No current facility-administered medications for this visit.    REVIEW OF SYSTEMS:  '[X]'$  denotes positive finding, '[ ]'$  denotes negative finding Cardiac  Comments:  Chest pain or chest pressure:    Shortness of breath upon exertion:    Short of breath when lying flat:    Irregular heart rhythm:        Vascular    Pain in calf, thigh, or hip brought on by ambulation:    Pain in feet at night that wakes  you up from your sleep:     Blood clot in your veins:    Leg swelling:  x         PHYSICAL EXAM: Vitals:   09/08/19 1438  BP: 140/66  Pulse: 68  Resp: 20  Temp: 97.6 F (36.4 C)  SpO2: 98%  Weight: 244 lb (110.7 kg)  Height: 6' (1.829 m)    GENERAL: The patient is a well-nourished male, in no acute distress. The vital signs are documented above. CARDIOVASCULAR: Plus radial and 2+ dorsalis pedis pulses.  Pitting edema bilaterally left greater than right.  Does not extend onto his foot PULMONARY: There is good air exchange  MUSCULOSKELETAL: There are no major deformities or cyanosis. NEUROLOGIC: No focal weakness or paresthesias are detected. SKIN: There are no ulcers or rashes noted. PSYCHIATRIC: The patient has a normal affect.  DATA:  Noninvasive studies revealed no evidence of DVT with patent deep venous system bilaterally  MEDICAL ISSUES: Discussed these findings with the patient.  He had right great saphenous vein harvested for his coronaries.  Does not have any evidence of superficial venous hypertension.  I suspect that this may be somewhat related to deep venous reflux but also overall volume status.  Would recommend continued diuresis.  I did explain the critical importance of keeping his legs elevated whenever possible above the level of his heart and also compression garments which she is wearing.  He was reassured and will see [place] again on an as-needed basis    Rosetta Posner, MD [facility] of [facility] Tel [phone] Pager [phone]

## 2019-09-10 ENCOUNTER — Encounter: Payer: Self-pay | Admitting: Internal Medicine

## 2019-09-10 ENCOUNTER — Other Ambulatory Visit: Payer: Self-pay

## 2019-09-10 ENCOUNTER — Ambulatory Visit (INDEPENDENT_AMBULATORY_CARE_PROVIDER_SITE_OTHER): Payer: Medicare Other | Admitting: Internal Medicine

## 2019-09-10 VITALS — BP 110/78 | HR 70 | Temp 98.1°F | Ht 72.0 in | Wt 244.2 lb

## 2019-09-10 DIAGNOSIS — E1159 Type 2 diabetes mellitus with other circulatory complications: Secondary | ICD-10-CM | POA: Diagnosis not present

## 2019-09-10 DIAGNOSIS — Z23 Encounter for immunization: Secondary | ICD-10-CM | POA: Diagnosis not present

## 2019-09-10 DIAGNOSIS — R296 Repeated falls: Secondary | ICD-10-CM | POA: Insufficient documentation

## 2019-09-10 DIAGNOSIS — R29898 Other symptoms and signs involving the musculoskeletal system: Secondary | ICD-10-CM | POA: Diagnosis not present

## 2019-09-10 DIAGNOSIS — W19XXXD Unspecified fall, subsequent encounter: Secondary | ICD-10-CM | POA: Diagnosis not present

## 2019-09-10 DIAGNOSIS — W19XXXA Unspecified fall, initial encounter: Secondary | ICD-10-CM | POA: Insufficient documentation

## 2019-09-10 MED ORDER — ACCU-CHEK FASTCLIX LANCETS MISC: 11 refills | Status: DC

## 2019-09-10 MED ORDER — DOXYCYCLINE HYCLATE 100 MG PO TABS: 100.0000 mg | ORAL_TABLET | Freq: Two times a day (BID) | ORAL | 0 refills | Status: DC

## 2019-09-10 NOTE — Assessment & Plan Note (Signed)
LS MRI PT Neurol ref

## 2019-09-10 NOTE — Assessment & Plan Note (Signed)
Lancets Rx Monitor labs

## 2019-09-10 NOTE — Progress Notes (Signed)
Subjective:  Patient ID: Larry [name], male    DOB: [birth year]  Age: 79 y.o. MRN: [id number]  CC: No chief complaint on file.   HPI Larry [name] presents for chronic LLE weaknes worse  - fell a couple times backwards - L leg gave away. No LOC.  S/p lawn mower accident in Jul 2021:  He experienced an accident during which he overturned his riding lawnmower and it landed on top of him. C/o hitting L shin  - more swelling  ER notes reviewed  Outpatient Medications Prior to Visit  Medication Sig Dispense Refill  . Accu-Chek FastClix Lancets MISC USE 1  TO CHECK GLUCOSE TWICE DAILY 102 each 0  . aspirin 325 MG tablet Take 1 tablet (325 mg total) by mouth daily. 60 tablet 0  . carvedilol (COREG) 25 MG tablet Take 1 tablet (25 mg total) by mouth 2 (two) times daily. 180 tablet 3  . Cholecalciferol (VITAMIN D3) 1000 UNITS CAPS Take 1,000 Units by mouth daily.     . folic acid (FOLVITE) 1 MG tablet Take 1 tablet (1 mg total) by mouth daily. 90 tablet 3  . furosemide (LASIX) 20 MG tablet Take 1-2 tablets (20-40 mg total) by mouth daily. 180 tablet 3  . glucose monitoring kit (FREESTYLE) monitoring kit 1 each by Does not apply route as needed for other. Please dispense brand of choice per ins/patient. Dx: 250.00. 1 each 0  . hydrALAZINE (APRESOLINE) 50 MG tablet Take 1 tablet (50 mg total) by mouth 3 (three) times daily. 270 tablet 3  . losartan (COZAAR) 100 MG tablet Take 1 tablet (100 mg total) by mouth daily. HOLD UNTIL SEEN BY YOUR PCP 90 tablet 3  . metFORMIN (GLUCOPHAGE) 1000 MG tablet Take 1 tablet (1,000 mg total) by mouth 2 (two) times daily with a meal. 180 tablet 3  . Multiple Vitamins-Minerals (MACULAR VITAMIN BENEFIT) TABS Take 1 tablet by mouth 2 (two) times daily.    . nitroGLYCERIN (NITROSTAT) 0.4 MG SL tablet Place 1 tablet (0.4 mg total) under the tongue every 5 (five) minutes as needed for chest pain. 20 tablet 3  . repaglinide (PRANDIN) 1 MG tablet Take 1 tablet (1 mg  total) by mouth 3 (three) times daily before meals. 270 tablet 3  . simvastatin (ZOCOR) 40 MG tablet Take 1 tablet (40 mg total) by mouth every evening. 90 tablet 3  . thiamine 100 MG tablet Take 1 tablet (100 mg total) by mouth daily. 90 tablet 3  . triamcinolone ointment (KENALOG) 0.1 % Apply 1 application topically 2 (two) times daily. On leg sores 80 g 2   No facility-administered medications prior to visit.    ROS: Review of Systems  Constitutional: Negative for appetite change, fatigue and unexpected weight change.  HENT: Negative for congestion, nosebleeds, sneezing, sore throat and trouble swallowing.   Eyes: Negative for itching and visual disturbance.  Respiratory: Negative for cough.   Cardiovascular: Positive for leg swelling. Negative for chest pain and palpitations.  Gastrointestinal: Negative for abdominal distention, blood in stool, diarrhea and nausea.  Genitourinary: Negative for frequency and hematuria.  Musculoskeletal: Positive for arthralgias and gait problem. Negative for back pain, joint swelling and neck pain.  Skin: Negative for rash.  Neurological: Positive for weakness. Negative for dizziness, tremors and speech difficulty.  Psychiatric/Behavioral: Negative for agitation, dysphoric mood and sleep disturbance. The patient is not nervous/anxious.     Objective:  BP 110/78 (BP Location: Left Arm, Patient Position: Sitting, Cuff  Size: Normal)   Pulse 70   Temp 98.1 F (36.7 C) (Oral)   Ht 6' (1.829 m)   Wt 244 lb 4 oz (110.8 kg)   SpO2 96%   BMI 33.13 kg/m   BP Readings from Last 3 Encounters:  09/10/19 110/78  09/08/19 140/66  09/03/19 (!) 191/67    Wt Readings from Last 3 Encounters:  09/10/19 244 lb 4 oz (110.8 kg)  09/08/19 244 lb (110.7 kg)  09/03/19 240 lb (108.9 kg)    Physical Exam Constitutional:      General: He is not in acute distress.    Appearance: He is well-developed.     Comments: NAD  Eyes:     Conjunctiva/sclera:  Conjunctivae normal.     Pupils: Pupils are equal, round, and reactive to light.  Neck:     Thyroid: No thyromegaly.     Vascular: No JVD.  Cardiovascular:     Rate and Rhythm: Normal rate and regular rhythm.     Heart sounds: Normal heart sounds. No murmur. No friction rub. No gallop.   Pulmonary:     Effort: Pulmonary effort is normal. No respiratory distress.     Breath sounds: Normal breath sounds. No wheezing or rales.  Chest:     Chest wall: No tenderness.  Abdominal:     General: Bowel sounds are normal. There is no distension.     Palpations: Abdomen is soft. There is no mass.     Tenderness: There is no abdominal tenderness. There is no guarding or rebound.  Musculoskeletal:        General: Swelling present. No tenderness. Normal range of motion.     Cervical back: Normal range of motion.  Lymphadenopathy:     Cervical: No cervical adenopathy.  Skin:    General: Skin is warm and dry.     Findings: No rash.  Neurological:     Mental Status: He is alert and oriented to person, place, and time.     Cranial Nerves: No cranial nerve deficit.     Motor: No abnormal muscle tone.     Coordination: Coordination normal.     Gait: Gait normal.     Deep Tendon Reflexes: Reflexes are normal and symmetric.  Psychiatric:        Behavior: Behavior normal.        Thought Content: Thought content normal.        Judgment: Judgment normal.   1 + B swelling L dist shin - erythema L leg extenders are 4/5 in MS Romberg (-) Mild ataxia  A complex case  Lab Results  Component Value Date   WBC 9.2 06/24/2019   HGB 12.1 (L) 06/24/2019   HCT 37.0 (L) 06/24/2019   PLT 236.0 06/24/2019   GLUCOSE 74 01/28/2019   CHOL 129 11/20/2018   TRIG 113 11/20/2018   HDL 38 (L) 11/20/2018   LDLCALC 68 11/20/2018   ALT 12 01/28/2019   AST 13 01/28/2019   NA 141 01/28/2019   K 4.4 01/28/2019   CL 106 01/28/2019   CREATININE 1.27 01/28/2019   BUN 30 (H) 01/28/2019   CO2 27 01/28/2019   TSH  2.18 06/24/2019   PSA 0.00 Repeated and verified X2. (L) 12/26/2012   INR 1.0 11/19/2018   HGBA1C 6.6 (H) 06/24/2019    VAS [place] LOWER EXTREMITY VENOUS REFLUX  Result Date: 09/08/2019  LOWER VENOUS REFLUX STUDY Indications: Swelling.  Risk Factors: Diuretics. Performing Technologist: Alvia Grove RVT  Examination Guidelines:  A complete evaluation includes B-mode imaging, spectral Doppler, color Doppler, and power Doppler as needed of all accessible portions of each vessel. Bilateral testing is considered an integral part of a complete examination. Limited examinations for reoccurring indications may be performed as noted. The reflux portion of the exam is performed with the patient in reverse Trendelenburg. Significant venous reflux is defined as ?500 ms in the superficial venous system, and >1 second in the deep venous system.  Venous Reflux Times Normal value < 0.5 sec +-------------+----------+---------+              Right (ms)Left (ms) +-------------+----------+---------+ CFV                    1724.00   +-------------+----------+---------+ GSV mid thigh          1724.00   +-------------+----------+---------+ +------------------------------+----------+---------+ VEIN DIAMETERS:               Right (cm)Left (cm) +------------------------------+----------+---------+ GSV at Saphenofemoral junction0.65      0.53      +------------------------------+----------+---------+ GSV at prox thigh             NV        0.0.55    +------------------------------+----------+---------+ GSV at mid thigh              0.20      0.0.46    +------------------------------+----------+---------+ GSV at distal thigh           0.26      0.50      +------------------------------+----------+---------+ GSV at knee                   NV        0.32      +------------------------------+----------+---------+ GSV prox calf                 0.26      0.24       +------------------------------+----------+---------+ GSV mid calf                  0.21      0.24      +------------------------------+----------+---------+ SSV origin                    0.18      0.20      +------------------------------+----------+---------+ SSV prox                      0.24      0.26      +------------------------------+----------+---------+ SSV mid                       0.28      0.32      +------------------------------+----------+---------+   Summary: Right: No evidence of deep vein thrombosis in the lower extremity from the common femoral vein to the popliteal vein. No reflux noted in the superficial system. . Left: Abnormal reflux times were noted in the common femoral vein, and great saphenous vein at the mid thigh. No reflux in the small saphenous vein.  *See table(s) above for measurements and observations. Electronically signed by Curt Jews MD on 09/08/2019 at 2:53:14 PM.    Final     Assessment & Plan:   There are no diagnoses linked to this encounter.   No orders of the defined types were placed in this encounter.    Follow-up: No follow-ups on file.  Walker Kehr, MD

## 2019-09-10 NOTE — Assessment & Plan Note (Signed)
PT ref MRI LS spine

## 2019-09-16 ENCOUNTER — Encounter: Payer: Self-pay | Admitting: Neurology

## 2019-09-18 ENCOUNTER — Encounter: Payer: Self-pay | Admitting: Physical Therapy

## 2019-09-18 ENCOUNTER — Other Ambulatory Visit: Payer: Self-pay

## 2019-09-18 ENCOUNTER — Ambulatory Visit: Payer: Medicare Other | Attending: Internal Medicine | Admitting: Physical Therapy

## 2019-09-18 DIAGNOSIS — M6281 Muscle weakness (generalized): Secondary | ICD-10-CM | POA: Diagnosis not present

## 2019-09-18 DIAGNOSIS — R296 Repeated falls: Secondary | ICD-10-CM | POA: Insufficient documentation

## 2019-09-18 DIAGNOSIS — R2689 Other abnormalities of gait and mobility: Secondary | ICD-10-CM | POA: Diagnosis not present

## 2019-09-18 NOTE — Therapy (Signed)
[place], [place], [address] Phone: [phone]   Fax:  [phone]  Physical Therapy Treatment  Patient Details  Name: Larry [name] MRN: [id number] Date of Birth: 30-Dec-1940 Referring Provider (PT): Dr Elwin Mocha    Encounter Date: 09/18/2019  PT End of Session - 09/18/19 1310    Visit Number  1    Number of Visits  6    Date for PT Re-Evaluation  10/30/19    Authorization Type  M[employer] 10th visit progress note    PT Start Time  1015    PT Stop Time  1058    PT Time Calculation (min)  43 min    Activity Tolerance  Patient tolerated treatment well    Behavior During Therapy  [facility] for tasks assessed/performed       Past Medical History:  Diagnosis Date  . Carotid stenosis, left   . Cataracts, bilateral    removed  . Coronary artery disease   . Hyperlipemia   . Prostate CA ([place])   . Sleep apnea    Dr Ledora Bottcher ; uses CPAP  . Stroke ([place])   . Type II or unspecified type diabetes mellitus without mention of complication, not stated as uncontrolled   . Unspecified essential hypertension     Past Surgical History:  Procedure Laterality Date  . APPENDECTOMY    . CARDIAC CATHETERIZATION    . CATARACT EXTRACTION, BILATERAL    . CORONARY ARTERY BYPASS GRAFT N/A 06/27/2017   Procedure: CORONARY ARTERY BYPASS GRAFTING (CABG), time four   using the left internal mammary artery, and right saphenous leg vein harvested endoscopically.;  Surgeon: Ivin Poot, MD;  Location: [place];  Service: Open Heart Surgery;  Laterality: N/A;  . CYSTOSCOPY N/A 06/27/2017   Procedure: CYSTOSCOPY FLEXIBLE, Balloon dilatation placement of foley catheter;  Surgeon: Ivin Poot, MD;  Location: [place];  Service: Open Heart Surgery;  Laterality: N/A;  . ENDARTERECTOMY Left 08/22/2017   Procedure: ENDARTERECTOMY CAROTID LEFT;  Surgeon: Serafina Mitchell, MD;  Location: [place];  Service: Vascular;  Laterality: Left;  .  INTRAVASCULAR ULTRASOUND/IVUS N/A 06/24/2017   Procedure: Intravascular Ultrasound/IVUS;  Surgeon: Belva Crome, MD;  Location: [place] [facility];  Service: Cardiovascular;  Laterality: N/A;  LEFT MAIN  . LEFT HEART CATH AND CORONARY ANGIOGRAPHY N/A 06/24/2017   Procedure: LEFT HEART CATH AND CORONARY ANGIOGRAPHY;  Surgeon: Belva Crome, MD;  Location: [place] [facility];  Service: Cardiovascular;  Laterality: N/A;  . PATCH ANGIOPLASTY Left 08/22/2017   Procedure: PATCH ANGIOPLASTY USING Rueben Bash BIOLOGIC PATCH;  Surgeon: Serafina Mitchell, MD;  Location: [place];  Service: Vascular;  Laterality: Left;  . PROSTATECTOMY    . TEE WITHOUT CARDIOVERSION N/A 06/27/2017   Procedure: TRANSESOPHAGEAL ECHOCARDIOGRAM (TEE);  Surgeon: Prescott Gum, Collier Salina, MD;  Location: [place];  Service: Open Heart Surgery;  Laterality: N/A;  . TONSILLECTOMY    . ULTRASOUND GUIDANCE FOR VASCULAR ACCESS  06/24/2017   Procedure: Ultrasound Guidance For Vascular Access;  Surgeon: Belva Crome, MD;  Location: [place] [facility];  Service: Cardiovascular;;    There were no vitals filed for this visit.  Subjective Assessment - 09/18/19 1021    Subjective  Patient flipped a lawnmower in June of 2020. He had no fractures but had pain and numbness into his left leg. He continues to have numbness the pain has improved. Around christams he was going up steps and felt like  his left leg gave out on him. Since that point he has had 2 other incedences where his lefg gave out on him. During one of the falls he had an ankle sprain. It is still a little sore.    Pertinent History  Storke, DMII, CAD    Limitations  Standing;Walking    How long can you walk comfortably?  can walk but left leg gives out    Diagnostic tests  X-ray Renae Gloss (-)  R ankle diffuse swelling    Patient Stated Goals  to improve balance and stability    Currently in Pain?  Yes    Pain Score  2     Pain Location  Ankle    Pain Orientation  Right;Left    Pain Descriptors /  Indicators  Aching    Pain Type  Chronic pain    Pain Onset  More than a month ago    Pain Frequency  Intermittent    Aggravating Factors   Standing and walking    Pain Relieving Factors  rest    Effect of Pain on Daily Activities  difficulty walking         [facility] PT Assessment - 09/18/19 0001      Assessment   Medical Diagnosis  Frequent falls     Referring Provider (PT)  Dr Elwin Mocha     Onset Date/Surgical Date  --   June 2021   Hand Dominance  Right    Next MD Visit  4-5 weeks     Prior Therapy  Has been to a chiroparacter       Precautions   Precautions  None      Restrictions   Weight Bearing Restrictions  No      Balance Screen   Has the patient fallen in the past 6 months  Yes    How many times?  3    Has the patient had a decrease in activity level because of a fear of falling?   No    Is the patient reluctant to leave their home because of a fear of falling?   No      Prior Function   Level of Independence  Independent    Vocation  Full time employment    Vocation Requirements  mobility and transportation     Leisure  just his daily activity       Cognition   Overall Cognitive Status  Within Functional Limits for tasks assessed    Attention  Focused    Focused Attention  Appears intact    Memory  Appears intact    Awareness  Appears intact    Problem Solving  Appears intact      Observation/Other Assessments   Focus on Therapeutic Outcomes (FOTO)   balance       Observation/Other Assessments-Edema    Edema  Figure 8   ankle     Figure 8 Edema   Figure 8 - Right   56    Figure 8 - Left   61      Sensation   Light Touch  Appears Intact      Coordination   Gross Motor Movements are Fluid and Coordinated  Yes    Fine Motor Movements are Fluid and Coordinated  Yes      Posture/Postural Control   Posture/Postural Control  No significant limitations      ROM / Strength   AROM / PROM / Strength  AROM;PROM;Strength  AROM   AROM  Assessment Site  Ankle    Right/Left Ankle  Right;Left    Right Ankle Dorsiflexion  15    Left Ankle Dorsiflexion  5      PROM   PROM Assessment Site  Hip;Knee;Ankle    Right/Left Ankle  Left    Left Ankle Dorsiflexion  8      Strength   Strength Assessment Site  Hip;Knee;Ankle    Right/Left Hip  Left    Left Hip Flexion  3+/5    Left Hip ABduction  4/5    Left Hip ADduction  4+/5    Right/Left Knee  Left    Left Knee Flexion  4+/5    Left Knee Extension  3/5    Right/Left Ankle  Left    Left Ankle Dorsiflexion  4/5    Left Ankle Inversion  4/5    Left Ankle Eversion  4/5      Palpation   Palpation comment  no significant tenderness to plapation on the left       Transfers   Comments  has fell transfering form sit to stand in the past but no LOB today.       Ambulation/Gait   Gait Comments  decreased single leg stance on the left side; mild lateral movement with gait. No LOB;       High Level Balance   High Level Balance Comments  narrow base eyes open/ eyes closed EO independent EC min gaurd; tandem stance Min a with EO and EC                    [facility] Adult PT Treatment/Exercise - 09/18/19 0001      High Level Balance   High Level Balance Comments  narrow base Eo and EC 2x30 sec hold each; tandem stance narrow base 2x30 sec       Exercises   Exercises  Knee/Hip      Knee/Hip Exercises: Seated   Other Seated Knee/Hip Exercises  laq yellow 2x10; hamstring curl 2x10 yellow; hip abduction clamshell 2x10              PT Education - 09/18/19 1032    Education Details  reviewed HEP and symptom mangement    Person(s) Educated  Patient    Methods  Explanation    Comprehension  Verbalized understanding;Returned demonstration;Verbal cues required;Tactile cues required       PT Short Term Goals - 09/18/19 1435      PT SHORT TERM GOAL #1   Title  Patient will demonstrate a 3 cm decrease in swelling in the left leg    Time  3    Period  Weeks     Status  New    Target Date  10/09/19      PT SHORT TERM GOAL #2   Title  Patient will increase left LE gross strength to 4+/5    Time  3    Period  Weeks    Status  New    Target Date  10/09/19      PT SHORT TERM GOAL #3   Title  Patient will demonstrate a 20 sec tandem stance with left leg back independent    Time  3    Period  Weeks    Status  New    Target Date  10/09/19        PT Long Term Goals - 09/18/19 1436      PT LONG TERM  GOAL #1   Title  Patient will have no falls for 2 weeks prior to discharge    Time  6    Period  Weeks    Status  New    Target Date  10/30/19      PT LONG TERM GOAL #2   Title  Patient will go up and down steps with no loss of balance in [place] to increase household safety    Baseline  had fallen backwards down the stairs in December but son caught him    Time  6    Period  Weeks    Status  New    Target Date  10/30/19            Plan - 09/18/19 1310    Clinical Impression Statement  Patient is a 79 year old male with left leg weakness and parasthesias from the left thigh into his left shin area. He has ankle swelling and decreased range of motion 2nd to a recent fall. x-rays were negative of his ankle but he has significant swelling and min to moderate pain depending on activity. He has decreased balance per Rhomberg testing and decreased left LE strength. He will be seesing a neurologist for his lower back. He has spasming in his left  paraspinals. He would benefit from skilled therapy to improve balance, decrease ankle swelling, and to improve left LE strength.    Personal Factors and Comorbidities  Comorbidity 1;Comorbidity 2    Comorbidities  Low back spasming, left ankle sprain    Examination-Activity Limitations  Squat;Stairs;Stand;Transfers    Examination-Participation Restrictions  Shop;Community Activity;Driving    Stability/Clinical Decision Making  Evolving/Moderate complexity   increasing number of falls recently   Clinical  Decision Making  Moderate    Rehab Potential  Good    PT Frequency  1x / week    PT Duration  6 weeks    PT Treatment/Interventions  ADLs/Self Care Home Management;Cryotherapy;Electrical Stimulation;Iontophoresis 4mg /ml Dexamethasone;Gait training;Stair training;Functional mobility training;Therapeutic exercise;Therapeutic activities;Neuromuscular re-education;Patient/family education;Manual techniques;Passive range of motion;Dry needling;Taping    PT Next Visit Plan  consider soft tissue mobilization to lumbar spine. He will see Nurologist in February. Continue balance exercises. Add instability and consider progrssion to single leg activity as able. Progress ther-ex, consider standing march; squat if back dosent hurt. consider light passive ROM and edema management to the ankle    PT Home Exercise Plan  ankle df stretch. Advised not to overstretch; seated laq, hamstring curl and clam with yellow band    Consulted and Agree with Plan of Care  Patient       Patient will benefit from skilled therapeutic intervention in order to improve the following deficits and impairments:  Abnormal gait, Difficulty walking, Decreased mobility, Decreased activity tolerance, Decreased strength, Decreased safety awareness, Increased fascial restricitons, Decreased range of motion, Pain, Improper body mechanics, Increased muscle spasms  Visit Diagnosis: Repeated falls  Other abnormalities of gait and mobility  Muscle weakness (generalized)     Problem List Patient Active Problem List   Diagnosis Date Noted  . Falls 09/10/2019  . Leg weakness 09/10/2019  . TIA (transient ischemic attack) 06/24/2019  . Hip pain, acute, left 03/24/2019  . Morbid (severe) obesity with alveolar hypoventilation ([place]) 01/13/2019  . Cerebral embolism with cerebral infarction 11/20/2018  . Close exposure to COVID-19 virus 11/19/2018  . Acute respiratory failure with hypoxemia ([place]) 11/19/2018  . Upper respiratory infection  09/22/2018  . Chronic combined systolic and diastolic heart failure ([place])  08/25/2017  . Carotid stenosis 08/22/2017  . Coronary artery disease involving coronary bypass graft of native heart with angina pectoris ([place]) 06/27/2017  . DOE (dyspnea on exertion) 06/18/2017  . Acute encephalopathy 11/21/2016  . Sciatic radiculitis 03/23/2016  . Edema 04/23/2012  . OBSTRUCTIVE SLEEP APNEA 10/23/2010  . LOW BACK PAIN, ACUTE 07/19/2009  . DM2 (diabetes mellitus, type 2) ([place]) 06/24/2007  . Dyslipidemia 06/24/2007  . Essential hypertension 06/24/2007  . PROSTATE CANCER, HX OF 06/24/2007    Carney Living PT DPT  09/18/2019, 2:37 PM  [facility] [address] [place], [place], [address] Phone: [phone]   Fax:  [phone]  Name: Larry [name] MRN: [id number] Date of Birth: 12/12/1940

## 2019-09-24 ENCOUNTER — Other Ambulatory Visit: Payer: Self-pay

## 2019-09-24 ENCOUNTER — Encounter: Payer: Self-pay | Admitting: Physical Therapy

## 2019-09-24 ENCOUNTER — Other Ambulatory Visit: Payer: Self-pay | Admitting: Internal Medicine

## 2019-09-24 ENCOUNTER — Ambulatory Visit: Payer: Medicare Other | Admitting: Internal Medicine

## 2019-09-24 ENCOUNTER — Ambulatory Visit: Payer: Medicare Other | Attending: Internal Medicine | Admitting: Physical Therapy

## 2019-09-24 DIAGNOSIS — R2689 Other abnormalities of gait and mobility: Secondary | ICD-10-CM | POA: Diagnosis not present

## 2019-09-24 DIAGNOSIS — M6281 Muscle weakness (generalized): Secondary | ICD-10-CM | POA: Diagnosis not present

## 2019-09-24 DIAGNOSIS — R296 Repeated falls: Secondary | ICD-10-CM | POA: Diagnosis not present

## 2019-09-25 NOTE — Therapy (Signed)
[place], [place], [address] Phone: [phone]   Fax:  [phone]  Physical Therapy Treatment  Patient Details  Name: Larry [name] MRN: [id number] Date of Birth: 1941/04/21 Referring Provider (PT): Dr Elwin Mocha    Encounter Date: 09/24/2019  PT End of Session - 09/25/19 1331    Visit Number  2    Number of Visits  6    Date for PT Re-Evaluation  10/30/19    Authorization Type  M[employer] 10th visit progress note    PT Start Time  1500    PT Stop Time  1542    PT Time Calculation (min)  42 min    Activity Tolerance  Patient tolerated treatment well    Behavior During Therapy  [facility] for tasks assessed/performed       Past Medical History:  Diagnosis Date  . Carotid stenosis, left   . Cataracts, bilateral    removed  . Coronary artery disease   . Hyperlipemia   . Prostate CA ([place])   . Sleep apnea    Dr Ledora Bottcher ; uses CPAP  . Stroke ([place])   . Type II or unspecified type diabetes mellitus without mention of complication, not stated as uncontrolled   . Unspecified essential hypertension     Past Surgical History:  Procedure Laterality Date  . APPENDECTOMY    . CARDIAC CATHETERIZATION    . CATARACT EXTRACTION, BILATERAL    . CORONARY ARTERY BYPASS GRAFT N/A 06/27/2017   Procedure: CORONARY ARTERY BYPASS GRAFTING (CABG), time four   using the left internal mammary artery, and right saphenous leg vein harvested endoscopically.;  Surgeon: Ivin Poot, MD;  Location: [place];  Service: Open Heart Surgery;  Laterality: N/A;  . CYSTOSCOPY N/A 06/27/2017   Procedure: CYSTOSCOPY FLEXIBLE, Balloon dilatation placement of foley catheter;  Surgeon: Ivin Poot, MD;  Location: [place];  Service: Open Heart Surgery;  Laterality: N/A;  . ENDARTERECTOMY Left 08/22/2017   Procedure: ENDARTERECTOMY CAROTID LEFT;  Surgeon: Serafina Mitchell, MD;  Location: [place];  Service: Vascular;  Laterality: Left;  .  INTRAVASCULAR ULTRASOUND/IVUS N/A 06/24/2017   Procedure: Intravascular Ultrasound/IVUS;  Surgeon: Belva Crome, MD;  Location: [place] [facility];  Service: Cardiovascular;  Laterality: N/A;  LEFT MAIN  . LEFT HEART CATH AND CORONARY ANGIOGRAPHY N/A 06/24/2017   Procedure: LEFT HEART CATH AND CORONARY ANGIOGRAPHY;  Surgeon: Belva Crome, MD;  Location: [place] [facility];  Service: Cardiovascular;  Laterality: N/A;  . PATCH ANGIOPLASTY Left 08/22/2017   Procedure: PATCH ANGIOPLASTY USING Rueben Bash BIOLOGIC PATCH;  Surgeon: Serafina Mitchell, MD;  Location: [place];  Service: Vascular;  Laterality: Left;  . PROSTATECTOMY    . TEE WITHOUT CARDIOVERSION N/A 06/27/2017   Procedure: TRANSESOPHAGEAL ECHOCARDIOGRAM (TEE);  Surgeon: Prescott Gum, Collier Salina, MD;  Location: [place];  Service: Open Heart Surgery;  Laterality: N/A;  . TONSILLECTOMY    . ULTRASOUND GUIDANCE FOR VASCULAR ACCESS  06/24/2017   Procedure: Ultrasound Guidance For Vascular Access;  Surgeon: Belva Crome, MD;  Location: [place] [facility];  Service: Cardiovascular;;    There were no vitals filed for this visit.  Subjective Assessment - 09/25/19 1326    Subjective  Patien reports no change in his symptoms. He has done most of his exercises but maybe not as much as he should of. No change in his symptoms. His ankle is feeling a little better. Patient also given stretch for  left gluteal. He tolerated instability work well. He reported no increase in ankle pain.    Pertinent History  Storke, DMII, CAD    Limitations  Standing;Walking    How long can you walk comfortably?  can walk but left leg gives out    Diagnostic tests  X-ray Renae Gloss (-)  R ankle diffuse swelling    Patient Stated Goals  to improve balance and stability    Currently in Pain?  No/denies                       [facility] Adult PT Treatment/Exercise - 09/25/19 0001      High Level Balance   High Level Balance Comments  narrow base Eo and EC 2x30 sec hold each;  tandem stance narrow base 2x30 sec ; all exercises repeated on air-ex pad. Min assist for stability. No increase in ankle pain; step onto aiir-ex 2x10; single leg stance 2x20sec hold bilateral mod ue support required for the left LE.       Knee/Hip Exercises: Standing   Other Standing Knee Exercises  standing march 2x10; mini squat with min cuing for technique 2x10.       Knee/Hip Exercises: Seated   Other Seated Knee/Hip Exercises  laq red 2x10; hamstring curl 2x10 red; hip abduction clamshell 2x10 red;        Manual Therapy   Manual therapy comments  trigger point release to left lumbar paraspinal and gluteal; no change noted in parathesias but patient could feel trigger point.              PT Education - 09/25/19 1330    Education Details  updated HEP    Person(s) Educated  Patient    Methods  Explanation;Demonstration;Verbal cues;Tactile cues    Comprehension  Verbalized understanding;Returned demonstration;Verbal cues required;Tactile cues required       PT Short Term Goals - 09/18/19 1435      PT SHORT TERM GOAL #1   Title  Patient will demonstrate a 3 cm decrease in swelling in the left leg    Time  3    Period  Weeks    Status  New    Target Date  10/09/19      PT SHORT TERM GOAL #2   Title  Patient will increase left LE gross strength to 4+/5    Time  3    Period  Weeks    Status  New    Target Date  10/09/19      PT SHORT TERM GOAL #3   Title  Patient will demonstrate a 20 sec tandem stance with left leg back independent    Time  3    Period  Weeks    Status  New    Target Date  10/09/19        PT Long Term Goals - 09/18/19 1436      PT LONG TERM GOAL #1   Title  Patient will have no falls for 2 weeks prior to discharge    Time  6    Period  Weeks    Status  New    Target Date  10/30/19      PT LONG TERM GOAL #2   Title  Patient will go up and down steps with no loss of balance in [place] to increase household safety    Baseline  had fallen  backwards down the stairs in December but son caught him    Time  6    Period  Weeks    Status  New    Target Date  10/30/19            Plan - 09/25/19 1332    Clinical Impression Statement  Patient tolerated treatment well. He had more difficulty with air-ex pad as would be expected. Therapy added functional squat and standing hip flexionto improve left LE strength. He had no increase in radicular symptoms. He was advised if he has any increase in numbness to stop activity right away. He will have an MRI on Saturday. We will advance ther-ex when MRI is back as long as MD dosen't put any restrictions following the MRI.    Personal Factors and Comorbidities  Comorbidity 1;Comorbidity 2    Comorbidities  Low back spasming, left ankle sprain    Examination-Activity Limitations  Squat;Stairs;Stand;Transfers    Examination-Participation Restrictions  Shop;Community Activity;Driving    Stability/Clinical Decision Making  Evolving/Moderate complexity    Clinical Decision Making  Moderate    Rehab Potential  Good    PT Frequency  1x / week    PT Duration  6 weeks    PT Treatment/Interventions  ADLs/Self Care Home Management;Cryotherapy;Electrical Stimulation;Iontophoresis 4mg /ml Dexamethasone;Gait training;Stair training;Functional mobility training;Therapeutic exercise;Therapeutic activities;Neuromuscular re-education;Patient/family education;Manual techniques;Passive range of motion;Dry needling;Taping    PT Next Visit Plan  consider soft tissue mobilization to lumbar spine. He will see Nurologist in February. Continue balance exercises. Add instability and consider progrssion to single leg activity as able. Progress ther-ex, consider standing march; squat if back dosent hurt. consider light passive ROM and edema management to the ankle    PT Home Exercise Plan  ankle df stretch. Advised not to overstretch; seated laq, hamstring curl and clam with yellow band       Patient will benefit from  skilled therapeutic intervention in order to improve the following deficits and impairments:  Abnormal gait, Difficulty walking, Decreased mobility, Decreased activity tolerance, Decreased strength, Decreased safety awareness, Increased fascial restricitons, Decreased range of motion, Pain, Improper body mechanics, Increased muscle spasms  Visit Diagnosis: Repeated falls  Other abnormalities of gait and mobility  Muscle weakness (generalized)     Problem List Patient Active Problem List   Diagnosis Date Noted  . Falls 09/10/2019  . Leg weakness 09/10/2019  . TIA (transient ischemic attack) 06/24/2019  . Hip pain, acute, left 03/24/2019  . Morbid (severe) obesity with alveolar hypoventilation ([place]) 01/13/2019  . Cerebral embolism with cerebral infarction 11/20/2018  . Close exposure to COVID-19 virus 11/19/2018  . Acute respiratory failure with hypoxemia ([place]) 11/19/2018  . Upper respiratory infection 09/22/2018  . Chronic combined systolic and diastolic heart failure ([place]) 08/25/2017  . Carotid stenosis 08/22/2017  . Coronary artery disease involving coronary bypass graft of native heart with angina pectoris ([place]) 06/27/2017  . DOE (dyspnea on exertion) 06/18/2017  . Acute encephalopathy 11/21/2016  . Sciatic radiculitis 03/23/2016  . Edema 04/23/2012  . OBSTRUCTIVE SLEEP APNEA 10/23/2010  . LOW BACK PAIN, ACUTE 07/19/2009  . DM2 (diabetes mellitus, type 2) ([place]) 06/24/2007  . Dyslipidemia 06/24/2007  . Essential hypertension 06/24/2007  . PROSTATE CANCER, HX OF 06/24/2007    Carney Living PT DPT  09/25/2019, 1:39 PM  [facility] [address] [place], [place], [address] Phone: [phone]   Fax:  [phone]  Name: Larry [name] MRN: [id number] Date of Birth: February 28, 1941

## 2019-09-26 ENCOUNTER — Other Ambulatory Visit: Payer: Self-pay

## 2019-09-26 ENCOUNTER — Ambulatory Visit
Admission: RE | Admit: 2019-09-26 | Discharge: 2019-09-26 | Disposition: A | Discharge status: Home / Self Care | Payer: Medicare Other | Source: Ambulatory Visit | Attending: Internal Medicine | Admitting: Internal Medicine

## 2019-09-26 DIAGNOSIS — R29898 Other symptoms and signs involving the musculoskeletal system: Secondary | ICD-10-CM

## 2019-09-26 DIAGNOSIS — W19XXXD Unspecified fall, subsequent encounter: Secondary | ICD-10-CM

## 2019-09-26 DIAGNOSIS — M48061 Spinal stenosis, lumbar region without neurogenic claudication: Secondary | ICD-10-CM | POA: Diagnosis not present

## 2019-09-28 ENCOUNTER — Other Ambulatory Visit: Payer: Self-pay | Admitting: Internal Medicine

## 2019-09-28 DIAGNOSIS — W19XXXD Unspecified fall, subsequent encounter: Secondary | ICD-10-CM

## 2019-09-28 DIAGNOSIS — R29898 Other symptoms and signs involving the musculoskeletal system: Secondary | ICD-10-CM

## 2019-10-02 ENCOUNTER — Ambulatory Visit: Payer: Medicare Other | Admitting: Physical Therapy

## 2019-10-09 ENCOUNTER — Ambulatory Visit: Payer: Medicare Other | Admitting: Physical Therapy

## 2019-10-12 ENCOUNTER — Ambulatory Visit: Payer: Medicare Other | Admitting: Neurology

## 2019-10-12 ENCOUNTER — Other Ambulatory Visit: Payer: Self-pay | Admitting: Internal Medicine

## 2019-10-14 DIAGNOSIS — M48062 Spinal stenosis, lumbar region with neurogenic claudication: Secondary | ICD-10-CM | POA: Diagnosis not present

## 2019-10-14 DIAGNOSIS — M48061 Spinal stenosis, lumbar region without neurogenic claudication: Secondary | ICD-10-CM | POA: Insufficient documentation

## 2019-10-16 ENCOUNTER — Encounter: Payer: Self-pay | Admitting: Physical Therapy

## 2019-10-16 ENCOUNTER — Ambulatory Visit: Payer: Medicare Other | Admitting: Physical Therapy

## 2019-10-16 ENCOUNTER — Other Ambulatory Visit: Payer: Self-pay

## 2019-10-16 DIAGNOSIS — R296 Repeated falls: Secondary | ICD-10-CM | POA: Diagnosis not present

## 2019-10-16 DIAGNOSIS — M6281 Muscle weakness (generalized): Secondary | ICD-10-CM

## 2019-10-16 DIAGNOSIS — R2689 Other abnormalities of gait and mobility: Secondary | ICD-10-CM

## 2019-10-16 NOTE — Therapy (Signed)
[place], [place], [address] Phone: [phone]   Fax:  [phone]  Physical Therapy Treatment  Patient Details  Name: Larry [name] MRN: [id number] Date of Birth: January 20, 1941 Referring Provider (PT): Dr Elwin Mocha    Encounter Date: 10/16/2019  PT End of Session - 10/16/19 1058    Visit Number  3    Number of Visits  6    Date for PT Re-Evaluation  10/30/19    Authorization Type  [employer] 10th visit progress note    PT Start Time  1016    PT Stop Time  1058    PT Time Calculation (min)  42 min    Activity Tolerance  Patient tolerated treatment well    Behavior During Therapy  [facility] for tasks assessed/performed       Past Medical History:  Diagnosis Date  . Carotid stenosis, left   . Cataracts, bilateral    removed  . Coronary artery disease   . Hyperlipemia   . Prostate CA ([place])   . Sleep apnea    Dr Ledora Bottcher ; uses CPAP  . Stroke ([place])   . Type II or unspecified type diabetes mellitus without mention of complication, not stated as uncontrolled   . Unspecified essential hypertension     Past Surgical History:  Procedure Laterality Date  . APPENDECTOMY    . CARDIAC CATHETERIZATION    . CATARACT EXTRACTION, BILATERAL    . CORONARY ARTERY BYPASS GRAFT N/A 06/27/2017   Procedure: CORONARY ARTERY BYPASS GRAFTING (CABG), time four   using the left internal mammary artery, and right saphenous leg vein harvested endoscopically.;  Surgeon: Ivin Poot, MD;  Location: [place];  Service: Open Heart Surgery;  Laterality: N/A;  . CYSTOSCOPY N/A 06/27/2017   Procedure: CYSTOSCOPY FLEXIBLE, Balloon dilatation placement of foley catheter;  Surgeon: Ivin Poot, MD;  Location: [place];  Service: Open Heart Surgery;  Laterality: N/A;  . ENDARTERECTOMY Left 08/22/2017   Procedure: ENDARTERECTOMY CAROTID LEFT;  Surgeon: Serafina Mitchell, MD;  Location: [place];  Service: Vascular;  Laterality: Left;  .  INTRAVASCULAR ULTRASOUND/IVUS N/A 06/24/2017   Procedure: Intravascular Ultrasound/IVUS;  Surgeon: Belva Crome, MD;  Location: [place] [facility];  Service: Cardiovascular;  Laterality: N/A;  LEFT MAIN  . LEFT HEART CATH AND CORONARY ANGIOGRAPHY N/A 06/24/2017   Procedure: LEFT HEART CATH AND CORONARY ANGIOGRAPHY;  Surgeon: Belva Crome, MD;  Location: [place] [facility];  Service: Cardiovascular;  Laterality: N/A;  . PATCH ANGIOPLASTY Left 08/22/2017   Procedure: PATCH ANGIOPLASTY USING Rueben Bash BIOLOGIC PATCH;  Surgeon: Serafina Mitchell, MD;  Location: [place];  Service: Vascular;  Laterality: Left;  . PROSTATECTOMY    . TEE WITHOUT CARDIOVERSION N/A 06/27/2017   Procedure: TRANSESOPHAGEAL ECHOCARDIOGRAM (TEE);  Surgeon: Prescott Gum, Collier Salina, MD;  Location: [place];  Service: Open Heart Surgery;  Laterality: N/A;  . TONSILLECTOMY    . ULTRASOUND GUIDANCE FOR VASCULAR ACCESS  06/24/2017   Procedure: Ultrasound Guidance For Vascular Access;  Surgeon: Belva Crome, MD;  Location: [place] [facility];  Service: Cardiovascular;;    There were no vitals filed for this visit.  Subjective Assessment - 10/16/19 1020    Subjective  Patient continues to have tingling in his leg. He has not had any falls. He had his MRI. He has been to Dr Ellene Route who is confident he can exercise without exacerbating his symptoms.    Pertinent History  [place], DMII, CAD    Limitations  Standing;Walking    How long can you walk comfortably?  can walk but left leg gives out    Diagnostic tests  X-ray Renae Gloss (-)  R ankle diffuse swelling    Patient Stated Goals  to improve balance and stability    Currently in Pain?  No/denies    Pain Onset  More than a month ago                       [facility] Adult PT Treatment/Exercise - 10/16/19 0001      High Level Balance   High Level Balance Comments  Narrow base EC and EO on air-ex 2x30 sec each; tandem on air-ex 2x20 sec hold each       Knee/Hip Exercises: Stretches    Active Hamstring Stretch Limitations  2x20 sec hold     Piriformis Stretch  2 reps;30 seconds      Knee/Hip Exercises: Aerobic   Nustep  5 min L3       Knee/Hip Exercises: Machines for Strengthening   Cybex Knee Flexion  1x10 15 lbs; lowered to 10 patient still reported pressure     Other Machine  reviewed cybex row with abdominal breating 2x10       Knee/Hip Exercises: Standing   Forward Step Up  2 sets;10 reps;Hand Hold: 2;Step Height: 2"      Knee/Hip Exercises: Supine   Bridges Limitations  2x10     Other Supine Knee/Hip Exercises  supine march 2x10       Manual Therapy   Manual Therapy  Manual Traction    Manual therapy comments  trigger point release to left lumbar paraspinal and gluteal; no change noted in parathesias but patient could feel trigger point.     Manual Traction  LAD 5x30 sec hold bilateral                PT Short Term Goals - 10/16/19 1146      PT SHORT TERM GOAL #1   Title  Patient will demonstrate a 3 cm decrease in swelling in the left leg    Period  Weeks    Status  On-going    Target Date  10/09/19      PT SHORT TERM GOAL #2   Title  Patient will increase left LE gross strength to 4+/5    Time  3    Period  Weeks    Status  On-going    Target Date  10/09/19      PT SHORT TERM GOAL #3   Title  Patient will demonstrate a 20 sec tandem stance with left leg back independent    Time  3    Period  Weeks    Status  On-going    Target Date  10/09/19        PT Long Term Goals - 09/18/19 1436      PT LONG TERM GOAL #1   Title  Patient will have no falls for 2 weeks prior to discharge    Time  6    Period  Weeks    Status  New    Target Date  10/30/19      PT LONG TERM GOAL #2   Title  Patient will go up and down steps with no loss of balance in roder to increase household safety    Baseline  had fallen backwards down the stairs in December but son caught  him    Time  6    Period  Weeks    Status  New    Target Date  10/30/19             Plan - 10/16/19 1037    Clinical Impression Statement  Therapy advanced patients ther-ex today. He tolerated well. he had no increase in pain. Therapy also worked on decompression of his spine. He had no increase or decrease in symptoms with LAD with occialations. Therapy will continue to advance ther-ex and balance exercises as tolerated. Therapy reviewed gym exercises with patient. The patient was unabe to do knee extension yet. Therapy also added in low level stair training. he felt uneasy with a 2 inch step. Therapy will continue to progress patient. SBA provided for balance and stair exercises.    Personal Factors and Comorbidities  Comorbidity 1;Comorbidity 2    Comorbidities  Low back spasming, left ankle sprain    Examination-Activity Limitations  Squat;Stairs;Stand;Transfers    Examination-Participation Restrictions  Shop;Community Activity;Driving    Stability/Clinical Decision Making  Evolving/Moderate complexity    Clinical Decision Making  Moderate    Rehab Potential  Good    PT Frequency  1x / week    PT Duration  6 weeks    PT Treatment/Interventions  ADLs/Self Care Home Management;Cryotherapy;Electrical Stimulation;Iontophoresis 4mg /ml Dexamethasone;Gait training;Stair training;Functional mobility training;Therapeutic exercise;Therapeutic activities;Neuromuscular re-education;Patient/family education;Manual techniques;Passive range of motion;Dry needling;Taping    PT Next Visit Plan  consider soft tissue mobilization to lumbar spine. He will see Nurologist in February. Continue balance exercises. Add instability and consider progrssion to single leg activity as able. Progress ther-ex, consider standing march; squat if back dosent hurt. consider light passive ROM and edema management to the ankle    PT Home Exercise Plan  ankle df stretch. Advised not to overstretch; seated laq, hamstring curl and clam with yellow band    Consulted and Agree with Plan of Care  Patient        Patient will benefit from skilled therapeutic intervention in order to improve the following deficits and impairments:  Abnormal gait, Difficulty walking, Decreased mobility, Decreased activity tolerance, Decreased strength, Decreased safety awareness, Increased fascial restricitons, Decreased range of motion, Pain, Improper body mechanics, Increased muscle spasms  Visit Diagnosis: Repeated falls  Other abnormalities of gait and mobility  Muscle weakness (generalized)     Problem List Patient Active Problem List   Diagnosis Date Noted  . Falls 09/10/2019  . Leg weakness 09/10/2019  . TIA (transient ischemic attack) 06/24/2019  . Hip pain, acute, left 03/24/2019  . 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 Cerebral embolism with cerebral infarction 11/20/2018  . Close exposure to COVID-19 virus 11/19/2018  . Acute respiratory failure with hypoxemia ([place]) 11/19/2018  . Upper respiratory infection 09/22/2018  . Chronic combined systolic and diastolic heart failure ([place]) 08/25/2017  . Carotid stenosis 08/22/2017  . Coronary artery disease involving coronary bypass graft of native heart with angina pectoris ([place]) 06/27/2017  . DOE (dyspnea on exertion) 06/18/2017  . Acute encephalopathy 11/21/2016  . Sciatic radiculitis 03/23/2016  . Edema 04/23/2012  . OBSTRUCTIVE SLEEP APNEA 10/23/2010  . LOW BACK PAIN, ACUTE 07/19/2009  . DM2 (diabetes mellitus, type 2) ([place]) 06/24/2007  . Dyslipidemia 06/24/2007  . Essential hypertension 06/24/2007  . PROSTATE CANCER, HX OF 06/24/2007    Carney Living PT DPT  10/16/2019, 11:48 AM  [facility] [address] [place], [place], [address] Phone: [phone]  Fax:  [phone]  Name: Larry [name] MRN: [id number] Date of Birth: [birth year]

## 2019-10-19 DIAGNOSIS — M5416 Radiculopathy, lumbar region: Secondary | ICD-10-CM | POA: Diagnosis not present

## 2019-10-19 DIAGNOSIS — M5116 Intervertebral disc disorders with radiculopathy, lumbar region: Secondary | ICD-10-CM | POA: Diagnosis not present

## 2019-10-22 ENCOUNTER — Other Ambulatory Visit: Payer: Self-pay

## 2019-10-22 ENCOUNTER — Ambulatory Visit (INDEPENDENT_AMBULATORY_CARE_PROVIDER_SITE_OTHER): Payer: Medicare Other | Admitting: Internal Medicine

## 2019-10-22 ENCOUNTER — Encounter: Payer: Self-pay | Admitting: Internal Medicine

## 2019-10-22 VITALS — BP 134/66 | HR 74 | Temp 98.4°F | Ht 72.0 in | Wt 244.0 lb

## 2019-10-22 DIAGNOSIS — I10 Essential (primary) hypertension: Secondary | ICD-10-CM | POA: Diagnosis not present

## 2019-10-22 DIAGNOSIS — E1159 Type 2 diabetes mellitus with other circulatory complications: Secondary | ICD-10-CM | POA: Diagnosis not present

## 2019-10-22 DIAGNOSIS — E118 Type 2 diabetes mellitus with unspecified complications: Secondary | ICD-10-CM | POA: Diagnosis not present

## 2019-10-22 DIAGNOSIS — I5042 Chronic combined systolic (congestive) and diastolic (congestive) heart failure: Secondary | ICD-10-CM

## 2019-10-22 DIAGNOSIS — R600 Localized edema: Secondary | ICD-10-CM

## 2019-10-22 DIAGNOSIS — G4733 Obstructive sleep apnea (adult) (pediatric): Secondary | ICD-10-CM | POA: Diagnosis not present

## 2019-10-22 DIAGNOSIS — G459 Transient cerebral ischemic attack, unspecified: Secondary | ICD-10-CM | POA: Diagnosis not present

## 2019-10-22 LAB — BASIC METABOLIC PANEL
BUN: 37 mg/dL — ABNORMAL HIGH (ref 6–23)
CO2: 28 mEq/L (ref 19–32)
Calcium: 9.9 mg/dL (ref 8.4–10.5)
Chloride: 100 mEq/L (ref 96–112)
Creatinine, Ser: 1.39 mg/dL (ref 0.40–1.50)
GFR: 49.43 mL/min — ABNORMAL LOW (ref >60.00)
Glucose, Bld: 298 mg/dL — ABNORMAL HIGH (ref 70–99)
Potassium: 4.6 mEq/L (ref 3.5–5.1)
Sodium: 137 mEq/L (ref 135–145)

## 2019-10-22 LAB — HEPATIC FUNCTION PANEL
ALT: 13 U/L (ref 0–53)
AST: 13 U/L (ref 0–37)
Albumin: 3.9 g/dL (ref 3.5–5.2)
Alkaline Phosphatase: 61 U/L (ref 39–117)
Bilirubin, Direct: 0.1 mg/dL (ref 0.0–0.3)
Total Bilirubin: 0.4 mg/dL (ref 0.2–1.2)
Total Protein: 6.9 g/dL (ref 6.0–8.3)

## 2019-10-22 LAB — HEMOGLOBIN A1C: Hgb A1c MFr Bld: 7.3 % — ABNORMAL HIGH (ref 4.6–6.5)

## 2019-10-22 MED ORDER — REPAGLINIDE 1 MG PO TABS: 1.0000 mg | ORAL_TABLET | Freq: Three times a day (TID) | ORAL | 3 refills | Status: DC

## 2019-10-22 NOTE — Assessment & Plan Note (Signed)
Labs q 3 mo 

## 2019-10-22 NOTE — Assessment & Plan Note (Signed)
On ASA 

## 2019-10-22 NOTE — Assessment & Plan Note (Signed)
Aldactone Furosemide

## 2019-10-22 NOTE — Assessment & Plan Note (Signed)
F/u labs Potassium has been good

## 2019-10-22 NOTE — Progress Notes (Signed)
Subjective:  Patient ID: Larry [name], male    DOB: 1940/10/26  Age: 79 y.o. MRN: [id number]  CC: No chief complaint on file.   HPI TILAK OAKLEY presents for LS radiculopathy - had injection in the spine. His PA dtr suggested Cymbalta for neuropathy. F/u edema, HTN, DM f/u  Outpatient Medications Prior to Visit  Medication Sig Dispense Refill  . Accu-Chek FastClix Lancets MISC USE 1  TO CHECK GLUCOSE TWICE DAILY 100 each 11  . aspirin 325 MG tablet Take 1 tablet (325 mg total) by mouth daily. 60 tablet 0  . carvedilol (COREG) 25 MG tablet Take 1 tablet (25 mg total) by mouth 2 (two) times daily. 180 tablet 3  . Cholecalciferol (VITAMIN D3) 1000 UNITS CAPS Take 1,000 Units by mouth daily.     [name] doxycycline (VIBRA-TABS) 100 MG tablet Take 1 tablet (100 mg total) by mouth 2 (two) times daily. 20 tablet 0  . folic acid (FOLVITE) 1 MG tablet Take 1 tablet (1 mg total) by mouth daily. 90 tablet 3  . furosemide (LASIX) 20 MG tablet Take 1-2 tablets (20-40 mg total) by mouth daily. 180 tablet 3  . furosemide (LASIX) 40 MG tablet Take 1 tablet by mouth once daily 90 tablet 0  . glucose monitoring kit (FREESTYLE) monitoring kit 1 each by Does not apply route as needed for other. Please dispense brand of choice per ins/patient. Dx: 250.00. 1 each 0  . hydrALAZINE (APRESOLINE) 50 MG tablet Take 1 tablet (50 mg total) by mouth 3 (three) times daily. 270 tablet 3  . losartan (COZAAR) 100 MG tablet Take 1 tablet (100 mg total) by mouth daily. HOLD UNTIL SEEN BY YOUR PCP 90 tablet 3  . metFORMIN (GLUCOPHAGE) 1000 MG tablet Take 1 tablet (1,000 mg total) by mouth 2 (two) times daily with a meal. 180 tablet 3  . Multiple Vitamins-Minerals (MACULAR VITAMIN BENEFIT) TABS Take 1 tablet by mouth 2 (two) times daily.    . nitroGLYCERIN (NITROSTAT) 0.4 MG SL tablet Place 1 tablet (0.4 mg total) under the tongue every 5 (five) minutes as needed for chest pain. 20 tablet 3  . repaglinide (PRANDIN) 1 MG  tablet Take 1 tablet (1 mg total) by mouth 3 (three) times daily before meals. 270 tablet 3  . simvastatin (ZOCOR) 40 MG tablet Take 1 tablet (40 mg total) by mouth every evening. 90 tablet 3  . thiamine 100 MG tablet Take 1 tablet (100 mg total) by mouth daily. 90 tablet 3  . triamcinolone ointment (KENALOG) 0.1 % Apply 1 application topically 2 (two) times daily. On leg sores 80 g 2   No facility-administered medications prior to visit.    ROS: Review of Systems  Constitutional: Positive for fatigue. Negative for appetite change and unexpected weight change.  HENT: Negative for congestion, nosebleeds, sneezing, sore throat and trouble swallowing.   Eyes: Negative for itching and visual disturbance.  Respiratory: Negative for cough.   Cardiovascular: Negative for chest pain, palpitations and leg swelling.  Gastrointestinal: Negative for abdominal distention, blood in stool, diarrhea and nausea.  Genitourinary: Negative for frequency and hematuria.  Musculoskeletal: Positive for back pain and gait problem. Negative for joint swelling and neck pain.  Skin: Negative for rash.  Neurological: Negative for dizziness, tremors, speech difficulty and weakness.  Psychiatric/Behavioral: Negative for agitation, dysphoric mood and sleep disturbance. The patient is not nervous/anxious.     Objective:  BP 134/66 (BP Location: Left Arm, Patient Position: Sitting, Cuff Size:  Large)   Pulse 74   Temp 98.4 F (36.9 C) (Oral)   Ht 6' (1.829 m)   Wt 244 lb (110.7 kg)   SpO2 98%   BMI 33.09 kg/m   BP Readings from Last 3 Encounters:  10/22/19 134/66  09/10/19 110/78  09/08/19 140/66    Wt Readings from Last 3 Encounters:  10/22/19 244 lb (110.7 kg)  09/10/19 244 lb 4 oz (110.8 kg)  09/08/19 244 lb (110.7 kg)    Physical Exam Constitutional:      General: He is not in acute distress.    Appearance: He is well-developed.     Comments: NAD  Eyes:     Conjunctiva/sclera: Conjunctivae  normal.     Pupils: Pupils are equal, round, and reactive to light.  Neck:     Thyroid: No thyromegaly.     Vascular: No JVD.  Cardiovascular:     Rate and Rhythm: Normal rate and regular rhythm.     Heart sounds: Normal heart sounds. No murmur. No friction rub. No gallop.   Pulmonary:     Effort: Pulmonary effort is normal. No respiratory distress.     Breath sounds: Normal breath sounds. No wheezing or rales.  Chest:     Chest wall: No tenderness.  Abdominal:     General: Bowel sounds are normal. There is no distension.     Palpations: Abdomen is soft. There is no mass.     Tenderness: There is no abdominal tenderness. There is no guarding or rebound.  Musculoskeletal:        General: Swelling and tenderness present. Normal range of motion.     Cervical back: Normal range of motion.  Lymphadenopathy:     Cervical: No cervical adenopathy.  Skin:    General: Skin is warm and dry.     Findings: No rash.  Neurological:     Mental Status: He is alert and oriented to person, place, and time.     Cranial Nerves: No cranial nerve deficit.     Motor: No abnormal muscle tone.     Coordination: Coordination normal.     Gait: Gait abnormal.     Deep Tendon Reflexes: Reflexes are normal and symmetric.  Psychiatric:        Behavior: Behavior normal.        Thought Content: Thought content normal.        Judgment: Judgment normal.   Trace edema  LS - better ROM  Lab Results  Component Value Date   WBC 9.2 06/24/2019   HGB 12.1 (L) 06/24/2019   HCT 37.0 (L) 06/24/2019   PLT 236.0 06/24/2019   GLUCOSE 74 01/28/2019   CHOL 129 11/20/2018   TRIG 113 11/20/2018   HDL 38 (L) 11/20/2018   LDLCALC 68 11/20/2018   ALT 12 01/28/2019   AST 13 01/28/2019   NA 141 01/28/2019   K 4.4 01/28/2019   CL 106 01/28/2019   CREATININE 1.27 01/28/2019   BUN 30 (H) 01/28/2019   CO2 27 01/28/2019   TSH 2.18 06/24/2019   PSA 0.00 Repeated and verified X2. (L) 12/26/2012   INR 1.0 11/19/2018    HGBA1C 6.6 (H) 06/24/2019    MR Lumbar Spine Wo Contrast  Result Date: 09/28/2019 CLINICAL DATA:  79 year old male with low back pain radiating down the left leg since July. Symptoms after lawnmower accident. EXAM: MRI LUMBAR SPINE WITHOUT CONTRAST TECHNIQUE: Multiplanar, multisequence MR imaging of the lumbar spine was performed. No intravenous contrast was  administered. COMPARISON:  Lumbar MRI 04/04/2016. Abdominal radiograph 11/20/2018. FINDINGS: Segmentation: Transitional anatomy. For the purposes of this report the same numbering system will be used as on the 2017 radiographs which designates a partially lumbarized S1 level with full size S1-S2 disc space. By this numbering system there are hypoplastic ribs at L1 (series 13 image 1). Correlation with radiographs is recommended prior to any operative intervention. Alignment: Stable lumbar lordosis since 2017. Mild retrolisthesis of L2 on L3, L3 on L4, L5 on S1. Vertebrae: No marrow edema or evidence of acute osseous abnormality. Visualized bone marrow signal is within normal limits. Intact visible sacrum and SI joints. Conus medullaris and cauda equina: Conus extends to the T12-L1 level, minimally visible and negative. Paraspinal and other soft tissues: Negative. Disc levels: T12-L1:  Negative. L1-L2: Mild posterior disc bulge or protrusion is stable without stenosis. Mild left facet hypertrophy. L2-L3: Chronic circumferential disc bulge with broad-based posterior and foraminal components. Mild facet hypertrophy. Mild disc progression since 2017 along with new mild spinal stenosis. No convincing lateral recess or foraminal stenosis. L3-L4: Disc desiccation with circumferential disc bulge. Chronic posterior annular fissure. Moderate posterior element hypertrophy and mild epidural lipomatosis. This level has progressed since 2017 with moderate spinal stenosis. Mild left lateral recess stenosis. Moderate left and mild right L3 foraminal stenosis. L4-L5: Mild  circumferential disc bulge and moderate posterior element hypertrophy appears stable. No significant spinal or lateral recess stenosis. There is chronic moderate to severe left L4 foraminal stenosis on series 5, image 11. L5-S1: Circumferential disc bulge with mild posterior element hypertrophy. No spinal or lateral recess stenosis. Mild to moderate left and mild right L5 foraminal stenosis is stable. S1-S2: Full size disc space. Mostly sacralized. Probable left side assimilation joint. No stenosis. IMPRESSION: 1. 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 Correlation with radiographs is recommended prior to any operative intervention. 2. 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 Moderate multifactorial spinal stenosis at the latter with mild left lateral recess and moderate left foraminal stenosis. Query left L3 and/or L4 radiculitis. 3. Superimposed chronic moderate to severe left L4 foraminal stenosis also at L4-L5. Up to moderate chronic left L5 foraminal stenosis. Electronically Signed   By: Genevie Ann M.D.   On: 09/28/2019 07:29    Assessment & Plan:   Walker Kehr, MD

## 2019-10-23 ENCOUNTER — Ambulatory Visit: Payer: Medicare Other | Attending: Internal Medicine | Admitting: Physical Therapy

## 2019-10-23 DIAGNOSIS — R2689 Other abnormalities of gait and mobility: Secondary | ICD-10-CM | POA: Insufficient documentation

## 2019-10-23 DIAGNOSIS — M6281 Muscle weakness (generalized): Secondary | ICD-10-CM | POA: Insufficient documentation

## 2019-10-23 DIAGNOSIS — R296 Repeated falls: Secondary | ICD-10-CM | POA: Insufficient documentation

## 2019-10-23 NOTE — Therapy (Addendum)
[place], [place], [address] Phone: [phone]   Fax:  [phone]  Physical Therapy Treatment/Discharge   Patient Details  Name: Larry [name] MRN: [id number] Date of Birth: 01-01-1941 Referring Provider (PT): Dr Elwin Mocha    Encounter Date: 10/23/2019  PT End of Session - 10/23/19 1247    Visit Number  3    Number of Visits  6    Date for PT Re-Evaluation  10/30/19    Authorization Type  [employer] 10th visit progress note    PT Start Time  1287    PT Stop Time  1155   no treatment 2nd to fall   PT Time Calculation (min)  10 min       Past Medical History:  Diagnosis Date  . Carotid stenosis, left   . Cataracts, bilateral    removed  . Coronary artery disease   . Hyperlipemia   . Prostate CA ([place])   . Sleep apnea    Dr Ledora Bottcher ; uses CPAP  . Stroke ([place])   . Type II or unspecified type diabetes mellitus without mention of complication, not stated as uncontrolled   . Unspecified essential hypertension     Past Surgical History:  Procedure Laterality Date  . APPENDECTOMY    . CARDIAC CATHETERIZATION    . CATARACT EXTRACTION, BILATERAL    . CORONARY ARTERY BYPASS GRAFT N/A 06/27/2017   Procedure: CORONARY ARTERY BYPASS GRAFTING (CABG), time four   using the left internal mammary artery, and right saphenous leg vein harvested endoscopically.;  Surgeon: Ivin Poot, MD;  Location: [facility];  Service: Open Heart Surgery;  Laterality: N/A;  . CYSTOSCOPY N/A 06/27/2017   Procedure: CYSTOSCOPY FLEXIBLE, Balloon dilatation placement of foley catheter;  Surgeon: Ivin Poot, MD;  Location: [facility];  Service: Open Heart Surgery;  Laterality: N/A;  . ENDARTERECTOMY Left 08/22/2017   Procedure: ENDARTERECTOMY CAROTID LEFT;  Surgeon: Serafina Mitchell, MD;  Location: [facility];  Service: Vascular;  Laterality: Left;  . INTRAVASCULAR ULTRASOUND/IVUS N/A 06/24/2017   Procedure: Intravascular  Ultrasound/IVUS;  Surgeon: Belva Crome, MD;  Location: [facility];  Service: Cardiovascular;  Laterality: N/A;  LEFT MAIN  . LEFT HEART CATH AND CORONARY ANGIOGRAPHY N/A 06/24/2017   Procedure: LEFT HEART CATH AND CORONARY ANGIOGRAPHY;  Surgeon: Belva Crome, MD;  Location: [facility];  Service: Cardiovascular;  Laterality: N/A;  . PATCH ANGIOPLASTY Left 08/22/2017   Procedure: PATCH ANGIOPLASTY USING Rueben Bash BIOLOGIC PATCH;  Surgeon: Serafina Mitchell, MD;  Location: [facility];  Service: Vascular;  Laterality: Left;  . PROSTATECTOMY    . TEE WITHOUT CARDIOVERSION N/A 06/27/2017   Procedure: TRANSESOPHAGEAL ECHOCARDIOGRAM (TEE);  Surgeon: Prescott Gum, Collier Salina, MD;  Location: [facility];  Service: Open Heart Surgery;  Laterality: N/A;  . TONSILLECTOMY    . ULTRASOUND GUIDANCE FOR VASCULAR ACCESS  06/24/2017   Procedure: Ultrasound Guidance For Vascular Access;  Surgeon: Belva Crome, MD;  Location: [facility];  Service: Cardiovascular;;    There were no vitals filed for this visit.                              PT Short Term Goals - 10/16/19 1146      PT SHORT TERM GOAL #1   Title  Patient will demonstrate a 3 cm decrease in swelling in the left leg  Period  Weeks    Status  On-going    Target Date  10/09/19      PT SHORT TERM GOAL #2   Title  Patient will increase left LE gross strength to 4+/5    Time  3    Period  Weeks    Status  On-going    Target Date  10/09/19      PT SHORT TERM GOAL #3   Title  Patient will demonstrate a 20 sec tandem stance with left leg back independent    Time  3    Period  Weeks    Status  On-going    Target Date  10/09/19        PT Long Term Goals - 09/18/19 1436      PT LONG TERM GOAL #1   Title  Patient will have no falls for 2 weeks prior to discharge    Time  6    Period  Weeks    Status  New    Target Date  10/30/19      PT LONG TERM GOAL #2   Title  Patient will go up and down steps with no  loss of balance in roder to increase household safety    Baseline  had fallen backwards down the stairs in December but son caught him    Time  6    Period  Weeks    Status  New    Target Date  10/30/19            Plan - 10/23/19 1245    Clinical Impression Statement  Patient arrives to therapy after haven fallen yesterday. He reports he was doing well and his leg gave out and he fell backwards. he needed assistance to get up. He reports no major injuries but is walking with a crutch. He plans to get checked out with an orthopedic and his neuro surgeon. He will cancel his appointment next week. He does not wish to have treatment until he is seen by MD's. Therapy will proceed per MD reccomendation. He is currently using a crutch for balance.    Personal Factors and Comorbidities  Comorbidity 1;Comorbidity 2    Comorbidities  Low back spasming, left ankle sprain    Examination-Activity Limitations  Squat;Stairs;Stand;Transfers       Patient will benefit from skilled therapeutic intervention in order to improve the following deficits and impairments:  Abnormal gait, Difficulty walking, Decreased mobility, Decreased activity tolerance, Decreased strength, Decreased safety awareness, Increased fascial restricitons, Decreased range of motion, Pain, Improper body mechanics, Increased muscle spasms  Visit Diagnosis: Repeated falls  Other abnormalities of gait and mobility  Muscle weakness (generalized)   PHYSICAL THERAPY DISCHARGE SUMMARY  Visits from Start of Care: 3  Current functional level related to goals / functional outcomes: Continued falls. Patient going for more evaluation.    Remaining deficits:  Falls   Education / Equipment: HEP   Plan: Patient agrees to discharge.  Patient goals were not met. Patient is being discharged due to a change in medical status.  ?????       Problem List Patient Active Problem List   Diagnosis Date Noted  . Falls 09/10/2019  . Leg  weakness 09/10/2019  . TIA (transient ischemic attack) 06/24/2019  . Hip pain, acute, left 03/24/2019  . Morbid (severe) obesity with alveolar hypoventilation ([place]) 01/13/2019  . Cerebral embolism with cerebral infarction 11/20/2018  . Close exposure to COVID-19 virus 11/19/2018  . Acute respiratory  failure with hypoxemia ([place]) 11/19/2018  . Upper respiratory infection 09/22/2018  . Chronic combined systolic and diastolic heart failure ([place]) 08/25/2017  . Carotid stenosis 08/22/2017  . Coronary artery disease involving coronary bypass graft of native heart with angina pectoris ([facility]) 06/27/2017  . DOE (dyspnea on exertion) 06/18/2017  . Acute encephalopathy 11/21/2016  . Sciatic radiculitis 03/23/2016  . Edema 04/23/2012  . OBSTRUCTIVE SLEEP APNEA 10/23/2010  . LOW BACK PAIN, ACUTE 07/19/2009  . DM2 (diabetes mellitus, type 2) ([place]) 06/24/2007  . Dyslipidemia 06/24/2007  . Essential hypertension 06/24/2007  . PROSTATE CANCER, HX OF 06/24/2007    Carney Living  PT DPT  10/23/2019, 12:48 PM  [facility] [address] [place], [place], [address] Phone: [phone]   Fax:  [phone]  Name: BERDELL HOSTETLER MRN: [id number] Date of Birth: 04-18-1941

## 2019-10-26 DIAGNOSIS — M25562 Pain in left knee: Secondary | ICD-10-CM | POA: Diagnosis not present

## 2019-10-26 DIAGNOSIS — M25552 Pain in left hip: Secondary | ICD-10-CM | POA: Diagnosis not present

## 2019-10-30 ENCOUNTER — Telehealth: Payer: Self-pay | Admitting: Internal Medicine

## 2019-10-30 ENCOUNTER — Ambulatory Visit: Payer: Medicare Other | Admitting: Physical Therapy

## 2019-10-30 NOTE — Telephone Encounter (Signed)
Recv'd records from [facility] forwarded 2 pages to Dr. Alain Marion 3/12/21fbg

## 2019-11-04 ENCOUNTER — Ambulatory Visit: Payer: Self-pay | Admitting: Adult Health

## 2019-11-04 DIAGNOSIS — M1612 Unilateral primary osteoarthritis, left hip: Secondary | ICD-10-CM | POA: Diagnosis not present

## 2019-11-04 DIAGNOSIS — M545 Low back pain: Secondary | ICD-10-CM | POA: Diagnosis not present

## 2019-11-05 ENCOUNTER — Ambulatory Visit: Payer: Medicare Other | Admitting: Physical Therapy

## 2019-11-11 ENCOUNTER — Ambulatory Visit (INDEPENDENT_AMBULATORY_CARE_PROVIDER_SITE_OTHER): Payer: Medicare Other | Admitting: Pulmonary Disease

## 2019-11-11 ENCOUNTER — Encounter: Payer: Self-pay | Admitting: Pulmonary Disease

## 2019-11-11 ENCOUNTER — Other Ambulatory Visit: Payer: Self-pay

## 2019-11-11 VITALS — BP 130/60 | HR 70 | Temp 97.6°F | Ht 72.0 in | Wt 232.4 lb

## 2019-11-11 DIAGNOSIS — G4733 Obstructive sleep apnea (adult) (pediatric): Secondary | ICD-10-CM | POA: Diagnosis not present

## 2019-11-11 NOTE — Patient Instructions (Signed)
Prescription for auto CPAP 8 to 15 cm will be sent to [facility] We will check report on this and fine-tune settings on your next visit

## 2019-11-11 NOTE — Progress Notes (Signed)
Subjective:    Patient ID: Larry [name], male    DOB: 1941-02-20, 79 y.o.   MRN: [id number]  HPI   Chief Complaint  Patient presents with  . Consult    Patient needs a new CPAP machine and supplies. Patient has been without for about 2 weeks.    79 year old diabetic, hypertensive, CABG 2018  presents to reestablish care for OSA. This was diagnosed in 2012 with a home sleep test which showed severe OSA.  He had remarkable improvement in his daytime somnolence and fatigue with CPAP and settled with a full facemask, I note auto CPAP settings with average pressure 13 cm noted on his download then.  He is very compliant by history.  About 2 weeks ago his CPAP broke down and he has been having a lot of difficulty sleeping.  Epworth sleepiness score is 7 and denies any problems driving Bedtime is around 10 PM, sleep latency is a few minutes, he generally starts off sleeping on his back but rolls over on his side and uses 1 pillow, reports 1 nocturnal awakening for nocturia and is out of bed at 7 AM feeling rested without dryness of mouth or headaches. His weight is fluctuated within 10 pounds over the last 2 years There is no history suggestive of cataplexy, sleep paralysis or parasomnias  PMH -admission 11/2018 for acute encephalopathy, fever, strokelike symptoms, bilateral infiltrates requiring mechanical ventilation, Covid negative   Significant tests/ events reviewed  HST 09/2010 severe OSA, AHI 42/hour, lowest desaturation 80%  MRI 02/2019 lacunar is in right basal ganglia, microhemorrhage left occipital lobe stable  Echo 09/2017 EF improved to 45 to 50%,  Past Medical History:  Diagnosis Date  . Carotid stenosis, left   . Cataracts, bilateral    removed  . Coronary artery disease   . Hyperlipemia   . Prostate CA ([place])   . Sleep apnea    Dr Ledora Bottcher ; uses CPAP  . Stroke ([place])   . Type II or unspecified type diabetes mellitus without mention of complication, not stated as  uncontrolled   . Unspecified essential hypertension     Past Surgical History:  Procedure Laterality Date  . APPENDECTOMY    . CARDIAC CATHETERIZATION    . CATARACT EXTRACTION, BILATERAL    . CORONARY ARTERY BYPASS GRAFT N/A 06/27/2017   Procedure: CORONARY ARTERY BYPASS GRAFTING (CABG), time four   using the left internal mammary artery, and right saphenous leg vein harvested endoscopically.;  Surgeon: Ivin Poot, MD;  Location: [facility];  Service: Open Heart Surgery;  Laterality: N/A;  . CYSTOSCOPY N/A 06/27/2017   Procedure: CYSTOSCOPY FLEXIBLE, Balloon dilatation placement of foley catheter;  Surgeon: Ivin Poot, MD;  Location: [facility];  Service: Open Heart Surgery;  Laterality: N/A;  . ENDARTERECTOMY Left 08/22/2017   Procedure: ENDARTERECTOMY CAROTID LEFT;  Surgeon: Serafina Mitchell, MD;  Location: [facility];  Service: Vascular;  Laterality: Left;  . INTRAVASCULAR ULTRASOUND/IVUS N/A 06/24/2017   Procedure: Intravascular Ultrasound/IVUS;  Surgeon: Belva Crome, MD;  Location: [facility];  Service: Cardiovascular;  Laterality: N/A;  LEFT MAIN  . LEFT HEART CATH AND CORONARY ANGIOGRAPHY N/A 06/24/2017   Procedure: LEFT HEART CATH AND CORONARY ANGIOGRAPHY;  Surgeon: Belva Crome, MD;  Location: [facility];  Service: Cardiovascular;  Laterality: N/A;  . PATCH ANGIOPLASTY Left 08/22/2017   Procedure: PATCH ANGIOPLASTY USING Rueben Bash BIOLOGIC PATCH;  Surgeon: Serafina Mitchell, MD;  Location: [facility];  Service: Vascular;  Laterality: Left;  . PROSTATECTOMY    . TEE WITHOUT CARDIOVERSION N/A 06/27/2017   Procedure: TRANSESOPHAGEAL ECHOCARDIOGRAM (TEE);  Surgeon: Prescott Gum, Collier Salina, MD;  Location: [facility];  Service: Open Heart Surgery;  Laterality: N/A;  . TONSILLECTOMY    . ULTRASOUND GUIDANCE FOR VASCULAR ACCESS  06/24/2017   Procedure: Ultrasound Guidance For Vascular Access;  Surgeon: Belva Crome, MD;  Location: [facility];  Service: Cardiovascular;;    Allergies    Allergen Reactions  . Amlodipine Swelling    Leg swelling  . Verapamil Swelling    Edema w/high dose  . Clindamycin Hcl     UNSPECIFIED REACTION   . Contrast Media [Iodinated Diagnostic Agents] Nausea And Vomiting    Social History   Socioeconomic History  . Marital status: Married    Spouse name: Not on file  . Number of children: Not on file  . Years of education: Not on file  . Highest education level: Bachelor's degree (e.g., BA, AB, BS)  Occupational History  . Occupation: Salesman  Tobacco Use  . Smoking status: Never Smoker  . Smokeless tobacco: Never Used  Substance and Sexual Activity  . Alcohol use: No    Alcohol/week: 0.0 standard drinks  . Drug use: No  . Sexual activity: Not on file  Other Topics Concern  . Not on file  Social History Narrative   Regular Exercise- no, golf   Social Determinants of Health   Financial Resource Strain:   . Difficulty of Paying Living Expenses:   Food Insecurity:   . Worried About Charity fundraiser in the Last Year:   . Arboriculturist in the Last Year:   Transportation Needs:   . Film/video editor (Medical):   [name] Lack of Transportation (Non-Medical):   Physical Activity:   . Days of Exercise per Week:   . Minutes of Exercise per Session:   Stress:   . Feeling of Stress :   Social Connections:   . Frequency of Communication with Friends and Family:   . Frequency of Social Gatherings with Friends and Family:   . Attends Religious Services:   . Active Member of Clubs or Organizations:   . Attends Archivist Meetings:   [name] Marital Status:   Intimate Partner Violence:   . Fear of Current or Ex-Partner:   . Emotionally Abused:   [name] Physically Abused:   . Sexually Abused:      Family History  Problem Relation Age of Onset  . Heart disease Mother   . Diabetes Other   . Hypertension Other   . Colon cancer Neg Hx       Review of Systems Constitutional: negative for anorexia, fevers and sweats   Eyes: negative for irritation, redness and visual disturbance  Ears, nose, mouth, throat, and face: negative for earaches, epistaxis, nasal congestion and sore throat  Respiratory: negative for cough, dyspnea on exertion, sputum and wheezing  Cardiovascular: negative for chest pain, dyspnea, lower extremity edema, orthopnea, palpitations and syncope  Gastrointestinal: negative for abdominal pain, constipation, diarrhea, melena, nausea and vomiting  Genitourinary:negative for dysuria, frequency and hematuria  Hematologic/lymphatic: negative for bleeding, easy bruising and lymphadenopathy  Musculoskeletal:negative for arthralgias, muscle weakness and stiff joints  Neurological: negative for coordination problems, gait problems, headaches and weakness  Endocrine: negative for diabetic symptoms including polydipsia, polyuria and weight loss     Objective:   Physical Exam  Gen. Pleasant, elderly,well-nourished, in no distress, normal affect ENT -  no pallor,icterus, no post nasal drip Neck: No JVD, no thyromegaly, no carotid bruits Lungs: no use of accessory muscles, no dullness to percussion, clear without rales or rhonchi  Cardiovascular: Rhythm regular, heart sounds  normal, no murmurs or gallops, no peripheral edema Abdomen: soft and non-tender, no hepatosplenomegaly, BS normal. Musculoskeletal: No deformities, no cyanosis or clubbing Neuro:  alert, non focal       Assessment & Plan:

## 2019-11-11 NOTE — Assessment & Plan Note (Addendum)
The pathophysiology of obstructive sleep apnea , it's cardiovascular consequences & modes of treatment including CPAP were discused with the patient in detail & they evidenced understanding. He seems to have severe OSA with remarkable improvement on CPAP, he has been very compliant and his daytime somnolence and fatigue is improved a lot. His CPAP is now broken down and he needs a replacement 1 week. I will write for auto CPAP 8 to 15 cm, he will continue on his current full facemask we will obtain download report a month after he is using it and fine-tune settings as needed  Weight loss encouraged, compliance with goal of at least 4-6 hrs every night is the expectation. Advised against medications with sedative side effects Cautioned against driving when sleepy - understanding that sleepiness will vary on a day to day basis  Given his cardiac history and prior stroke, cardiovascular risk of untreated OSA is high

## 2019-11-12 ENCOUNTER — Ambulatory Visit: Payer: Medicare Other | Admitting: Physical Therapy

## 2019-11-18 DIAGNOSIS — L570 Actinic keratosis: Secondary | ICD-10-CM | POA: Diagnosis not present

## 2019-11-18 DIAGNOSIS — D0439 Carcinoma in situ of skin of other parts of face: Secondary | ICD-10-CM | POA: Diagnosis not present

## 2019-11-18 DIAGNOSIS — L578 Other skin changes due to chronic exposure to nonionizing radiation: Secondary | ICD-10-CM | POA: Diagnosis not present

## 2019-11-18 DIAGNOSIS — L821 Other seborrheic keratosis: Secondary | ICD-10-CM | POA: Diagnosis not present

## 2019-12-09 DIAGNOSIS — M545 Low back pain: Secondary | ICD-10-CM | POA: Diagnosis not present

## 2019-12-09 DIAGNOSIS — M1712 Unilateral primary osteoarthritis, left knee: Secondary | ICD-10-CM | POA: Diagnosis not present

## 2019-12-09 DIAGNOSIS — M1612 Unilateral primary osteoarthritis, left hip: Secondary | ICD-10-CM | POA: Diagnosis not present

## 2019-12-29 ENCOUNTER — Other Ambulatory Visit: Payer: Self-pay | Admitting: Internal Medicine

## 2020-01-03 ENCOUNTER — Other Ambulatory Visit: Payer: Self-pay | Admitting: Internal Medicine

## 2020-01-19 NOTE — Progress Notes (Signed)
Cardiology Office Note:    Date:  01/21/2020   ID:  [name], DOB May 04, 1941, MRN [id number]  PCP:  Cassandria Anger, MD  Cardiologist:  Sinclair Grooms, MD   Referring MD: Cassandria Anger, MD   Chief Complaint  Patient presents with  . Coronary Artery Disease  . Congestive Heart Failure    History of Present Illness:    Larry [name] is a 79 y.o. male with a hx of CAD, coronary bypass grafting 2018 (LIMA to LAD, SVG to Ramus Intermediate, SVG to OM, and SVG to RCA), obesity, type 2 diabetes, chronic combined systolic and diastolic heart failure (most recent hospital stay August 25, 2017, carotid artery disease status post left carotid endarterectomy January 2019 and severe obesity.LVEF 45%.  He has no cardiac complaints.  He does have positional/orthostatic dizziness that lasts for seconds and then resolves.  He has not had syncope or fainting.  He denies angina.  He is not having orthopnea or PND.  He does have bilateral lower extremity swelling.  He is compliant with his medication regimen which includes therapy for systolic dysfunction.  He is not on an SGLT2 agent.  Past Medical History:  Diagnosis Date  . Carotid stenosis, left   . Cataracts, bilateral    removed  . Coronary artery disease   . Hyperlipemia   . Prostate CA ([place])   . Sleep apnea    Dr Ledora Bottcher ; uses CPAP  . Stroke ([place])   . Type II or unspecified type diabetes mellitus without mention of complication, not stated as uncontrolled   . Unspecified essential hypertension     Past Surgical History:  Procedure Laterality Date  . APPENDECTOMY    . CARDIAC CATHETERIZATION    . CATARACT EXTRACTION, BILATERAL    . CORONARY ARTERY BYPASS GRAFT N/A 06/27/2017   Procedure: CORONARY ARTERY BYPASS GRAFTING (CABG), time four   using the left internal mammary artery, and right saphenous leg vein harvested endoscopically.;  Surgeon: Ivin Poot, MD;  Location: [employer];  Service: Open Heart  Surgery;  Laterality: N/A;  . CYSTOSCOPY N/A 06/27/2017   Procedure: CYSTOSCOPY FLEXIBLE, Balloon dilatation placement of foley catheter;  Surgeon: Ivin Poot, MD;  Location: [facility];  Service: Open Heart Surgery;  Laterality: N/A;  . ENDARTERECTOMY Left 08/22/2017   Procedure: ENDARTERECTOMY CAROTID LEFT;  Surgeon: Serafina Mitchell, MD;  Location: [facility];  Service: Vascular;  Laterality: Left;  . INTRAVASCULAR ULTRASOUND/IVUS N/A 06/24/2017   Procedure: Intravascular Ultrasound/IVUS;  Surgeon: Belva Crome, MD;  Location: [facility];  Service: Cardiovascular;  Laterality: N/A;  LEFT MAIN  . LEFT HEART CATH AND CORONARY ANGIOGRAPHY N/A 06/24/2017   Procedure: LEFT HEART CATH AND CORONARY ANGIOGRAPHY;  Surgeon: Belva Crome, MD;  Location: [facility];  Service: Cardiovascular;  Laterality: N/A;  . PATCH ANGIOPLASTY Left 08/22/2017   Procedure: PATCH ANGIOPLASTY USING Rueben Bash BIOLOGIC PATCH;  Surgeon: Serafina Mitchell, MD;  Location: [facility];  Service: Vascular;  Laterality: Left;  . PROSTATECTOMY    . TEE WITHOUT CARDIOVERSION N/A 06/27/2017   Procedure: TRANSESOPHAGEAL ECHOCARDIOGRAM (TEE);  Surgeon: Prescott Gum, Collier Salina, MD;  Location: [facility];  Service: Open Heart Surgery;  Laterality: N/A;  . TONSILLECTOMY    . ULTRASOUND GUIDANCE FOR VASCULAR ACCESS  06/24/2017   Procedure: Ultrasound Guidance For Vascular Access;  Surgeon: Belva Crome, MD;  Location: [facility];  Service: Cardiovascular;;    Current Medications:  Current Meds  Medication Sig  . Accu-Chek FastClix Lancets MISC USE 1  TO CHECK GLUCOSE TWICE DAILY  . aspirin 325 MG tablet Take 1 tablet (325 mg total) by mouth daily.  . carvedilol (COREG) 25 MG tablet Take 1 tablet by mouth twice daily  . Cholecalciferol (VITAMIN D3) 1000 UNITS CAPS Take 1,000 Units by mouth daily.   . folic acid (FOLVITE) 1 MG tablet Take 1 tablet (1 mg total) by mouth daily.  . furosemide (LASIX) 40 MG tablet Take 1 tablet (40 mg total)  by mouth daily.  [name] glucose monitoring kit (FREESTYLE) monitoring kit 1 each by Does not apply route as needed for other. Please dispense brand of choice per ins/patient. Dx: 250.00.  . hydrALAZINE (APRESOLINE) 50 MG tablet Take 1 tablet (50 mg total) by mouth 3 (three) times daily.  [name] losartan (COZAAR) 100 MG tablet Take 1 tablet (100 mg total) by mouth daily. HOLD UNTIL SEEN BY YOUR PCP  . metFORMIN (GLUCOPHAGE) 1000 MG tablet Take 1 tablet (1,000 mg total) by mouth 2 (two) times daily with a meal.  . Multiple Vitamins-Minerals (MACULAR VITAMIN BENEFIT) TABS Take 1 tablet by mouth 2 (two) times daily.  . nitroGLYCERIN (NITROSTAT) 0.4 MG SL tablet Place 1 tablet (0.4 mg total) under the tongue every 5 (five) minutes as needed for chest pain.  . repaglinide (PRANDIN) 1 MG tablet Take 1 tablet (1 mg total) by mouth 3 (three) times daily before meals.  . simvastatin (ZOCOR) 40 MG tablet Take 1 tablet (40 mg total) by mouth every evening.  . thiamine 100 MG tablet Take 1 tablet (100 mg total) by mouth daily.     Allergies:   Amlodipine, Verapamil, Clindamycin hcl, and Contrast media [iodinated diagnostic agents]   Social History   Socioeconomic History  . Marital status: Married    Spouse name: Not on file  . Number of children: Not on file  . Years of education: Not on file  . Highest education level: Bachelor's degree (e.g., BA, AB, BS)  Occupational History  . Occupation: Salesman  Tobacco Use  . Smoking status: Never Smoker  . Smokeless tobacco: Never Used  Substance and Sexual Activity  . Alcohol use: No    Alcohol/week: 0.0 standard drinks  . Drug use: No  . Sexual activity: Not on file  Other Topics Concern  . Not on file  Social History Narrative   Regular Exercise- no, golf   Social Determinants of Health   Financial Resource Strain:   . Difficulty of Paying Living Expenses:   Food Insecurity:   . Worried About Charity fundraiser in the Last Year:   . Arboriculturist  in the Last Year:   Transportation Needs:   . Film/video editor (Medical):   [name] Lack of Transportation (Non-Medical):   Physical Activity:   . Days of Exercise per Week:   . Minutes of Exercise per Session:   Stress:   . Feeling of Stress :   Social Connections:   . Frequency of Communication with Friends and Family:   . Frequency of Social Gatherings with Friends and Family:   . Attends Religious Services:   . Active Member of Clubs or Organizations:   . Attends Archivist Meetings:   [name] Marital Status:      Family History: The patient's family history includes Diabetes in an other family member; Heart disease in his mother; Hypertension in an other family member. There is no  history of Colon cancer.  ROS:   Please see the history of present illness.    Has left knee discomfort.  Has not been vaccinated.  Does not want to use vaccines that have RNA RN activated virus particles.  All other systems reviewed and are negative.  EKGs/Labs/Other Studies Reviewed:    The following studies were reviewed today: No cardiac imaging or testing no recent lipid panel  EKG:  EKG normal sinus rhythm, first-degree AV block at 208 ms, and when compared to the prior tracings from April 2020 heart rate is slower.  Recent Labs: 06/24/2019: Hemoglobin 12.1; Platelets 236.0; TSH 2.18 10/22/2019: ALT 13; BUN 37; Creatinine, Ser 1.39; Potassium 4.6; Sodium 137  Recent Lipid Panel    Component Value Date/Time   CHOL 129 11/20/2018 0425   CHOL 137 09/19/2018 0743   TRIG 113 11/20/2018 0425   HDL 38 (L) 11/20/2018 0425   HDL 39 (L) 09/19/2018 0743   CHOLHDL 3.4 11/20/2018 0425   VLDL 23 11/20/2018 0425   LDLCALC 68 11/20/2018 0425   LDLCALC 67 09/19/2018 0743    Physical Exam:    VS:  BP 124/72   Pulse 78   Ht 6' (1.829 m)   Wt 242 lb (109.8 kg)   SpO2 97%   BMI 32.82 kg/m     Wt Readings from Last 3 Encounters:  01/21/20 242 lb (109.8 kg)  11/11/19 232 lb 6.4 oz (105.4  kg)  10/22/19 244 lb (110.7 kg)     GEN: Moderate obesity. No acute distress HEENT: Normal NECK: No JVD. LYMPHATICS: No lymphadenopathy CARDIAC: Soft gallop, possibly S3.  RRR without murmur, S4 gallop, with 2+ bilateral ankle  edema. VASCULAR:  Normal Pulses. No bruits. RESPIRATORY:  Clear to auscultation without rales, wheezing or rhonchi  ABDOMEN: Soft, non-tender, non-distended, No pulsatile mass, MUSCULOSKELETAL: No deformity  SKIN: Warm and dry NEUROLOGIC:  Alert and oriented x 3 PSYCHIATRIC:  Normal affect   ASSESSMENT:    1. Coronary artery disease involving coronary bypass graft of native heart with angina pectoris ([place])   2. Chronic combined systolic and diastolic heart failure ([place])   3. Essential hypertension   4. Type 2 diabetes mellitus with other circulatory complication, without long-term current use of insulin ([place])   5. Dyslipidemia   6. Educated about COVID-19 virus infection    PLAN:    In order of problems listed above:  1. Stable without angina.  Secondary prevention discussed. 2. Will need an echocardiogram performed before the year is out to reassess LV function.  Options that still remain that could be helpful include an SGLT2, and possibly uptitrating renin-angiotensin blockade by converting Cozaar to Entresto. 3. Blood pressure is excellent.  Continue current therapy including hydralazine, Cozaar, furosemide and carvedilol. 4. Consider adding SGLT2.  Hemoglobin A1c greater than 7.  This agent would help prevent future episodes of heart failure and decrease A1c. 5. LDL target less than 70.  Needs a lipid panel done when he sees Dr. Alain Marion later this year.  For the time being continue moderate dose statin therapy. 6. Chooses not to be vaccinated because of concerns with RNA and attenuated virus platforms.  I encouraged him to continue to practice the 3W's.   Overall education and awareness concerning primary/secondary risk prevention was discussed in  detail: LDL less than 70, hemoglobin A1c less than 7, blood pressure target less than 130/80 mmHg, >150 minutes of moderate aerobic activity per week, avoidance of smoking, weight control (via diet and  exercise), and continued surveillance/management of/for obstructive sleep apnea.    Medication Adjustments/Labs and Tests Ordered: Current medicines are reviewed at length with the patient today.  Concerns regarding medicines are outlined above.  Orders Placed This Encounter  Procedures  . EKG 12-Lead   No orders of the defined types were placed in this encounter.   There are no Patient Instructions on file for this visit.   Signed, Sinclair Grooms, MD  01/21/2020 2:08 PM    [facility]

## 2020-01-21 ENCOUNTER — Other Ambulatory Visit: Payer: Self-pay

## 2020-01-21 ENCOUNTER — Encounter: Payer: Self-pay | Admitting: Interventional Cardiology

## 2020-01-21 ENCOUNTER — Ambulatory Visit (INDEPENDENT_AMBULATORY_CARE_PROVIDER_SITE_OTHER): Payer: Medicare Other | Admitting: Interventional Cardiology

## 2020-01-21 VITALS — BP 124/72 | HR 78 | Ht 72.0 in | Wt 242.0 lb

## 2020-01-21 DIAGNOSIS — I25709 Atherosclerosis of coronary artery bypass graft(s), unspecified, with unspecified angina pectoris: Secondary | ICD-10-CM | POA: Diagnosis not present

## 2020-01-21 DIAGNOSIS — E1159 Type 2 diabetes mellitus with other circulatory complications: Secondary | ICD-10-CM | POA: Diagnosis not present

## 2020-01-21 DIAGNOSIS — I5042 Chronic combined systolic (congestive) and diastolic (congestive) heart failure: Secondary | ICD-10-CM

## 2020-01-21 DIAGNOSIS — I10 Essential (primary) hypertension: Secondary | ICD-10-CM

## 2020-01-21 DIAGNOSIS — Z7189 Other specified counseling: Secondary | ICD-10-CM | POA: Diagnosis not present

## 2020-01-21 DIAGNOSIS — E785 Hyperlipidemia, unspecified: Secondary | ICD-10-CM

## 2020-01-21 NOTE — Patient Instructions (Signed)
Medication Instructions:  Your physician recommends that you continue on your current medications as directed. Please refer to the Current Medication list given to you today.  *If you need a refill on your cardiac medications before your next appointment, please call your pharmacy*   Lab Work: None If you have labs (blood work) drawn today and your tests are completely normal, you will receive your results only by: [name] MyChart Message (if you have MyChart) OR . A paper copy in the mail If you have any lab test that is abnormal or we need to change your treatment, we will call you to review the results.   Testing/Procedures: Your physician has requested that you have an echocardiogram before the end of the year. Echocardiography is a painless test that uses sound waves to create images of your heart. It provides your doctor with information about the size and shape of your heart and how well your heart's chambers and valves are working. This procedure takes approximately one hour. There are no restrictions for this procedure.     Follow-Up: At [facility], you and your health needs are our priority.  As part of our continuing mission to provide you with exceptional heart care, we have created designated Provider Care Teams.  These Care Teams include your primary Cardiologist (physician) and Advanced Practice Providers (APPs -  Physician Assistants and Nurse Practitioners) who all work together to provide you with the care you need, when you need it.  We recommend signing up for the patient portal called "MyChart".  Sign up information is provided on this After Visit Summary.  MyChart is used to connect with patients for Virtual Visits (Telemedicine).  Patients are able to view lab/test results, encounter notes, upcoming appointments, etc.  Non-urgent messages can be sent to your provider as well.   To learn more about what you can do with MyChart, go to [email or web address].    Your next  appointment:   12 month(s)  The format for your next appointment:   In Person  Provider:   You may see Sinclair Grooms, MD or one of the following Advanced Practice Providers on your designated Care Team:    Truitt Merle, NP  Cecilie Kicks, NP  Kathyrn Drown, NP    Other Instructions

## 2020-01-25 ENCOUNTER — Encounter: Payer: Self-pay | Admitting: Internal Medicine

## 2020-01-25 ENCOUNTER — Ambulatory Visit (INDEPENDENT_AMBULATORY_CARE_PROVIDER_SITE_OTHER): Payer: Medicare Other | Admitting: Internal Medicine

## 2020-01-25 ENCOUNTER — Other Ambulatory Visit: Payer: Self-pay

## 2020-01-25 VITALS — BP 120/60 | HR 72 | Temp 98.1°F | Ht 72.0 in | Wt 240.0 lb

## 2020-01-25 DIAGNOSIS — Z20822 Contact with and (suspected) exposure to covid-19: Secondary | ICD-10-CM | POA: Diagnosis not present

## 2020-01-25 DIAGNOSIS — I25709 Atherosclerosis of coronary artery bypass graft(s), unspecified, with unspecified angina pectoris: Secondary | ICD-10-CM

## 2020-01-25 DIAGNOSIS — G4700 Insomnia, unspecified: Secondary | ICD-10-CM

## 2020-01-25 DIAGNOSIS — E1159 Type 2 diabetes mellitus with other circulatory complications: Secondary | ICD-10-CM | POA: Diagnosis not present

## 2020-01-25 DIAGNOSIS — N183 Chronic kidney disease, stage 3 unspecified: Secondary | ICD-10-CM | POA: Diagnosis not present

## 2020-01-25 DIAGNOSIS — R600 Localized edema: Secondary | ICD-10-CM | POA: Diagnosis not present

## 2020-01-25 DIAGNOSIS — I5042 Chronic combined systolic (congestive) and diastolic (congestive) heart failure: Secondary | ICD-10-CM

## 2020-01-25 LAB — BASIC METABOLIC PANEL
BUN: 26 mg/dL — ABNORMAL HIGH (ref 6–23)
CO2: 30 mEq/L (ref 19–32)
Calcium: 9.4 mg/dL (ref 8.4–10.5)
Chloride: 103 mEq/L (ref 96–112)
Creatinine, Ser: 1.28 mg/dL (ref 0.40–1.50)
GFR: 54.32 mL/min — ABNORMAL LOW (ref >60.00)
Glucose, Bld: 160 mg/dL — ABNORMAL HIGH (ref 70–99)
Potassium: 4.2 mEq/L (ref 3.5–5.1)
Sodium: 140 mEq/L (ref 135–145)

## 2020-01-25 LAB — HEMOGLOBIN A1C: Hgb A1c MFr Bld: 7 % — ABNORMAL HIGH (ref 4.6–6.5)

## 2020-01-25 MED ORDER — TRAMADOL HCL 50 MG PO TABS: 50.0000 mg | ORAL_TABLET | Freq: Four times a day (QID) | ORAL | 0 refills | Status: AC | PRN

## 2020-01-25 NOTE — Assessment & Plan Note (Signed)
Advised to get vaccinated

## 2020-01-25 NOTE — Progress Notes (Signed)
Subjective:  Patient ID: Larry [name], male    DOB: 12/23/1940  Age: 79 y.o. MRN: [id number]  CC: No chief complaint on file.   HPI Larry [name] presents for LBP - Dr Percell Miller - getting injections. C/o L knee pain - - a brace. C/o severe L hip pain  - can't sleep x weeks;tired F/u edema, CAD, DM   Outpatient Medications Prior to Visit  Medication Sig Dispense Refill  . Accu-Chek FastClix Lancets MISC USE 1  TO CHECK GLUCOSE TWICE DAILY 100 each 11  . aspirin 325 MG tablet Take 1 tablet (325 mg total) by mouth daily. 60 tablet 0  . carvedilol (COREG) 25 MG tablet Take 1 tablet by mouth twice daily (Patient taking differently: Take 25 mg by mouth daily. ) 180 tablet 1  . Cholecalciferol (VITAMIN D3) 1000 UNITS CAPS Take 1,000 Units by mouth daily.     . folic acid (FOLVITE) 1 MG tablet Take 1 tablet (1 mg total) by mouth daily. 90 tablet 3  . furosemide (LASIX) 40 MG tablet Take 1 tablet (40 mg total) by mouth daily. 90 tablet 3  . glucose monitoring kit (FREESTYLE) monitoring kit 1 each by Does not apply route as needed for other. Please dispense brand of choice per ins/patient. Dx: 250.00. 1 each 0  . hydrALAZINE (APRESOLINE) 50 MG tablet Take 1 tablet (50 mg total) by mouth 3 (three) times daily. 270 tablet 3  . losartan (COZAAR) 100 MG tablet Take 1 tablet (100 mg total) by mouth daily. HOLD UNTIL SEEN BY YOUR PCP 90 tablet 3  . metFORMIN (GLUCOPHAGE) 1000 MG tablet Take 1 tablet (1,000 mg total) by mouth 2 (two) times daily with a meal. 180 tablet 3  . Multiple Vitamins-Minerals (MACULAR VITAMIN BENEFIT) TABS Take 1 tablet by mouth 2 (two) times daily.    . nitroGLYCERIN (NITROSTAT) 0.4 MG SL tablet Place 1 tablet (0.4 mg total) under the tongue every 5 (five) minutes as needed for chest pain. 20 tablet 3  . repaglinide (PRANDIN) 1 MG tablet Take 1 tablet (1 mg total) by mouth 3 (three) times daily before meals. 270 tablet 3  . simvastatin (ZOCOR) 40 MG tablet Take 1 tablet (40 mg  total) by mouth every evening. 90 tablet 3  . thiamine 100 MG tablet Take 1 tablet (100 mg total) by mouth daily. 90 tablet 3   No facility-administered medications prior to visit.    ROS: Review of Systems  Constitutional: Negative for appetite change, fatigue and unexpected weight change.  HENT: Negative for congestion, nosebleeds, sneezing, sore throat and trouble swallowing.   Eyes: Negative for itching and visual disturbance.  Respiratory: Negative for cough.   Cardiovascular: Negative for chest pain, palpitations and leg swelling.  Gastrointestinal: Negative for abdominal distention, blood in stool, diarrhea and nausea.  Genitourinary: Negative for frequency and hematuria.  Musculoskeletal: Positive for arthralgias, back pain and gait problem. Negative for joint swelling and neck pain.  Skin: Negative for rash.  Neurological: Negative for dizziness, tremors, speech difficulty and weakness.  Psychiatric/Behavioral: Negative for agitation, dysphoric mood, sleep disturbance and suicidal ideas. The patient is not nervous/anxious.     Objective:  BP 120/60 (BP Location: Left Arm, Patient Position: Sitting, Cuff Size: Large)   Pulse 72   Temp 98.1 F (36.7 C) (Oral)   Ht 6' (1.829 m)   Wt 240 lb (108.9 kg)   SpO2 96%   BMI 32.55 kg/m   BP Readings from Last 3 Encounters:  01/25/20 120/60  01/21/20 124/72  11/11/19 130/60    Wt Readings from Last 3 Encounters:  01/25/20 240 lb (108.9 kg)  01/21/20 242 lb (109.8 kg)  11/11/19 232 lb 6.4 oz (105.4 kg)    Physical Exam Constitutional:      General: He is not in acute distress.    Appearance: He is well-developed.     Comments: NAD  Eyes:     Conjunctiva/sclera: Conjunctivae normal.     Pupils: Pupils are equal, round, and reactive to light.  Neck:     Thyroid: No thyromegaly.     Vascular: No JVD.  Cardiovascular:     Rate and Rhythm: Normal rate and regular rhythm.     Heart sounds: Normal heart sounds. No  murmur. No friction rub. No gallop.   Pulmonary:     Effort: Pulmonary effort is normal. No respiratory distress.     Breath sounds: Normal breath sounds. No wheezing or rales.  Chest:     Chest wall: No tenderness.  Abdominal:     General: Bowel sounds are normal. There is no distension.     Palpations: Abdomen is soft. There is no mass.     Tenderness: There is no abdominal tenderness. There is no guarding or rebound.  Musculoskeletal:        General: No tenderness. Normal range of motion.     Cervical back: Normal range of motion.  Lymphadenopathy:     Cervical: No cervical adenopathy.  Skin:    General: Skin is warm and dry.     Findings: No rash.  Neurological:     Mental Status: He is alert.     Cranial Nerves: No cranial nerve deficit.     Motor: No abnormal muscle tone.     Coordination: Coordination normal.     Gait: Gait abnormal.     Deep Tendon Reflexes: Reflexes are normal and symmetric.  Psychiatric:        Behavior: Behavior normal.        Thought Content: Thought content normal.        Judgment: Judgment normal.     Lab Results  Component Value Date   WBC 9.2 06/24/2019   HGB 12.1 (L) 06/24/2019   HCT 37.0 (L) 06/24/2019   PLT 236.0 06/24/2019   GLUCOSE 298 (H) 10/22/2019   CHOL 129 11/20/2018   TRIG 113 11/20/2018   HDL 38 (L) 11/20/2018   LDLCALC 68 11/20/2018   ALT 13 10/22/2019   AST 13 10/22/2019   NA 137 10/22/2019   K 4.6 10/22/2019   CL 100 10/22/2019   CREATININE 1.39 10/22/2019   BUN 37 (H) 10/22/2019   CO2 28 10/22/2019   TSH 2.18 06/24/2019   PSA 0.00 Repeated and verified X2. (L) 12/26/2012   INR 1.0 11/19/2018   HGBA1C 7.3 (H) 10/22/2019    MR Lumbar Spine Wo Contrast  Result Date: 09/28/2019 CLINICAL DATA:  79 year old male with low back pain radiating down the left leg since July. Symptoms after lawnmower accident. EXAM: MRI LUMBAR SPINE WITHOUT CONTRAST TECHNIQUE: Multiplanar, multisequence MR imaging of the lumbar spine was  performed. No intravenous contrast was administered. COMPARISON:  Lumbar MRI 04/04/2016. Abdominal radiograph 11/20/2018. FINDINGS: Segmentation: Transitional anatomy. For the purposes of this report the same numbering system will be used as on the 2017 radiographs which designates a partially lumbarized S1 level with full size S1-S2 disc space. By this numbering system there are hypoplastic ribs at L1 (series 13 image 1).  Correlation with radiographs is recommended prior to any operative intervention. Alignment: Stable lumbar lordosis since 2017. Mild retrolisthesis of L2 on L3, L3 on L4, L5 on S1. Vertebrae: No marrow edema or evidence of acute osseous abnormality. Visualized bone marrow signal is within normal limits. Intact visible sacrum and SI joints. Conus medullaris and cauda equina: Conus extends to the T12-L1 level, minimally visible and negative. Paraspinal and other soft tissues: Negative. Disc levels: T12-L1:  Negative. L1-L2: Mild posterior disc bulge or protrusion is stable without stenosis. Mild left facet hypertrophy. L2-L3: Chronic circumferential disc bulge with broad-based posterior and foraminal components. Mild facet hypertrophy. Mild disc progression since 2017 along with new mild spinal stenosis. No convincing lateral recess or foraminal stenosis. L3-L4: Disc desiccation with circumferential disc bulge. Chronic posterior annular fissure. Moderate posterior element hypertrophy and mild epidural lipomatosis. This level has progressed since 2017 with moderate spinal stenosis. Mild left lateral recess stenosis. Moderate left and mild right L3 foraminal stenosis. L4-L5: Mild circumferential disc bulge and moderate posterior element hypertrophy appears stable. No significant spinal or lateral recess stenosis. There is chronic moderate to severe left L4 foraminal stenosis on series 5, image 11. L5-S1: Circumferential disc bulge with mild posterior element hypertrophy. No spinal or lateral recess  stenosis. Mild to moderate left and mild right L5 foraminal stenosis is stable. S1-S2: Full size disc space. Mostly sacralized. Probable left side assimilation joint. No stenosis. IMPRESSION: 1. 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 Correlation with radiographs is recommended prior to any operative intervention. 2. 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 Moderate multifactorial spinal stenosis at the latter with mild left lateral recess and moderate left foraminal stenosis. Query left L3 and/or L4 radiculitis. 3. Superimposed chronic moderate to severe left L4 foraminal stenosis also at L4-L5. Up to moderate chronic left L5 foraminal stenosis. Electronically Signed   By: Genevie Ann M.D.   On: 09/28/2019 07:29    Assessment & Plan:   There are no diagnoses linked to this encounter.   No orders of the defined types were placed in this encounter.    Follow-up: No follow-ups on file.  Walker Kehr, MD

## 2020-01-25 NOTE — Assessment & Plan Note (Signed)
Treat L hip pain, LBP

## 2020-01-25 NOTE — Assessment & Plan Note (Signed)
Hold Losartan Re-start 2nd dose of Coreg D/c Aleve

## 2020-01-25 NOTE — Assessment & Plan Note (Signed)
compr socks 

## 2020-01-29 ENCOUNTER — Other Ambulatory Visit: Payer: Self-pay | Admitting: Internal Medicine

## 2020-02-11 ENCOUNTER — Ambulatory Visit: Payer: Medicare Other | Admitting: Adult Health

## 2020-02-12 ENCOUNTER — Ambulatory Visit: Payer: Medicare Other | Admitting: Adult Health

## 2020-02-24 ENCOUNTER — Ambulatory Visit: Payer: Medicare Other | Admitting: Internal Medicine

## 2020-03-02 ENCOUNTER — Encounter: Payer: Self-pay | Admitting: Internal Medicine

## 2020-03-02 ENCOUNTER — Other Ambulatory Visit: Payer: Self-pay

## 2020-03-02 ENCOUNTER — Ambulatory Visit (INDEPENDENT_AMBULATORY_CARE_PROVIDER_SITE_OTHER): Payer: Medicare Other | Admitting: Internal Medicine

## 2020-03-02 DIAGNOSIS — I25709 Atherosclerosis of coronary artery bypass graft(s), unspecified, with unspecified angina pectoris: Secondary | ICD-10-CM | POA: Diagnosis not present

## 2020-03-02 DIAGNOSIS — Z8546 Personal history of malignant neoplasm of prostate: Secondary | ICD-10-CM

## 2020-03-02 DIAGNOSIS — R29898 Other symptoms and signs involving the musculoskeletal system: Secondary | ICD-10-CM

## 2020-03-02 DIAGNOSIS — E1159 Type 2 diabetes mellitus with other circulatory complications: Secondary | ICD-10-CM | POA: Diagnosis not present

## 2020-03-02 DIAGNOSIS — N183 Chronic kidney disease, stage 3 unspecified: Secondary | ICD-10-CM

## 2020-03-02 MED ORDER — ACCU-CHEK SMARTVIEW VI STRP: ORAL_STRIP | 3 refills | Status: DC

## 2020-03-02 MED ORDER — ACCU-CHEK FASTCLIX LANCETS MISC: 11 refills | Status: DC

## 2020-03-02 NOTE — Assessment & Plan Note (Signed)
BMET 

## 2020-03-02 NOTE — Assessment & Plan Note (Signed)
No CP 

## 2020-03-02 NOTE — Assessment & Plan Note (Signed)
F/u w/Dr Percell Miller

## 2020-03-02 NOTE — Progress Notes (Signed)
Subjective:  Patient ID: Larry [name], male    DOB: 03/24/1941  Age: 79 y.o. MRN: [id number]  CC: No chief complaint on file.   HPI PETER KEYWORTH presents for HTN, edema, LLE pain - better  Outpatient Medications Prior to Visit  Medication Sig Dispense Refill  . Accu-Chek FastClix Lancets MISC USE 1  TO CHECK GLUCOSE TWICE DAILY 100 each 11  . ACCU-CHEK SMARTVIEW test strip USE 1 STRIP TO CHECK GLUCOSE TWICE DAILY 100 each 1  . aspirin 325 MG tablet Take 1 tablet (325 mg total) by mouth daily. 60 tablet 0  . carvedilol (COREG) 25 MG tablet Take 1 tablet by mouth twice daily (Patient taking differently: Take 25 mg by mouth daily. ) 180 tablet 1  . Cholecalciferol (VITAMIN D3) 1000 UNITS CAPS Take 1,000 Units by mouth daily.     . folic acid (FOLVITE) 1 MG tablet Take 1 tablet (1 mg total) by mouth daily. 90 tablet 3  . furosemide (LASIX) 40 MG tablet Take 1 tablet (40 mg total) by mouth daily. 90 tablet 3  . glucose monitoring kit (FREESTYLE) monitoring kit 1 each by Does not apply route as needed for other. Please dispense brand of choice per ins/patient. Dx: 250.00. 1 each 0  . hydrALAZINE (APRESOLINE) 50 MG tablet Take 1 tablet (50 mg total) by mouth 3 (three) times daily. 270 tablet 3  . metFORMIN (GLUCOPHAGE) 1000 MG tablet Take 1 tablet (1,000 mg total) by mouth 2 (two) times daily with a meal. 180 tablet 3  . Multiple Vitamins-Minerals (MACULAR VITAMIN BENEFIT) TABS Take 1 tablet by mouth 2 (two) times daily.    . nitroGLYCERIN (NITROSTAT) 0.4 MG SL tablet Place 1 tablet (0.4 mg total) under the tongue every 5 (five) minutes as needed for chest pain. 20 tablet 3  . repaglinide (PRANDIN) 1 MG tablet Take 1 tablet (1 mg total) by mouth 3 (three) times daily before meals. 270 tablet 3  . simvastatin (ZOCOR) 40 MG tablet Take 1 tablet (40 mg total) by mouth every evening. 90 tablet 3  . thiamine 100 MG tablet Take 1 tablet (100 mg total) by mouth daily. 90 tablet 3   No  facility-administered medications prior to visit.    ROS: Review of Systems  Constitutional: Positive for fatigue.  Cardiovascular: Positive for leg swelling.  Musculoskeletal: Positive for gait problem.    Objective:  BP (!) 132/40 (BP Location: Left Arm, Patient Position: Sitting, Cuff Size: Large)   Pulse 69   Temp (!) 97.5 F (36.4 C) (Oral)   Ht 6' (1.829 m)   Wt 236 lb (107 kg)   SpO2 95%   BMI 32.01 kg/m   BP Readings from Last 3 Encounters:  03/02/20 (!) 132/40  01/25/20 120/60  01/21/20 124/72    Wt Readings from Last 3 Encounters:  03/02/20 236 lb (107 kg)  01/25/20 240 lb (108.9 kg)  01/21/20 242 lb (109.8 kg)    Physical Exam Constitutional:      General: He is not in acute distress.    Appearance: He is well-developed.     Comments: NAD  Eyes:     Conjunctiva/sclera: Conjunctivae normal.     Pupils: Pupils are equal, round, and reactive to light.  Neck:     Thyroid: No thyromegaly.     Vascular: No JVD.  Cardiovascular:     Rate and Rhythm: Normal rate and regular rhythm.     Heart sounds: Normal heart sounds. No murmur  heard.  No friction rub. No gallop.   Pulmonary:     Effort: Pulmonary effort is normal. No respiratory distress.     Breath sounds: Normal breath sounds. No wheezing or rales.  Chest:     Chest wall: No tenderness.  Abdominal:     General: Bowel sounds are normal. There is no distension.     Palpations: Abdomen is soft. There is no mass.     Tenderness: There is no abdominal tenderness. There is no guarding or rebound.  Musculoskeletal:        General: No tenderness. Normal range of motion.     Cervical back: Normal range of motion.  Lymphadenopathy:     Cervical: No cervical adenopathy.  Skin:    General: Skin is warm and dry.     Findings: No rash.  Neurological:     Mental Status: He is alert and oriented to person, place, and time.     Cranial Nerves: No cranial nerve deficit.     Motor: No abnormal muscle tone.      Coordination: Coordination normal.     Gait: Gait normal.     Deep Tendon Reflexes: Reflexes are normal and symmetric.  Psychiatric:        Behavior: Behavior normal.        Thought Content: Thought content normal.        Judgment: Judgment normal.     Lab Results  Component Value Date   WBC 9.2 06/24/2019   HGB 12.1 (L) 06/24/2019   HCT 37.0 (L) 06/24/2019   PLT 236.0 06/24/2019   GLUCOSE 160 (H) 01/25/2020   CHOL 129 11/20/2018   TRIG 113 11/20/2018   HDL 38 (L) 11/20/2018   LDLCALC 68 11/20/2018   ALT 13 10/22/2019   AST 13 10/22/2019   NA 140 01/25/2020   K 4.2 01/25/2020   CL 103 01/25/2020   CREATININE 1.28 01/25/2020   BUN 26 (H) 01/25/2020   CO2 30 01/25/2020   TSH 2.18 06/24/2019   PSA 0.00 Repeated and verified X2. (L) 12/26/2012   INR 1.0 11/19/2018   HGBA1C 7.0 (H) 01/25/2020    MR Lumbar Spine Wo Contrast  Result Date: 09/28/2019 CLINICAL DATA:  79 year old male with low back pain radiating down the left leg since July. Symptoms after lawnmower accident. EXAM: MRI LUMBAR SPINE WITHOUT CONTRAST TECHNIQUE: Multiplanar, multisequence MR imaging of the lumbar spine was performed. No intravenous contrast was administered. COMPARISON:  Lumbar MRI 04/04/2016. Abdominal radiograph 11/20/2018. FINDINGS: Segmentation: Transitional anatomy. For the purposes of this report the same numbering system will be used as on the 2017 radiographs which designates a partially lumbarized S1 level with full size S1-S2 disc space. By this numbering system there are hypoplastic ribs at L1 (series 13 image 1). Correlation with radiographs is recommended prior to any operative intervention. Alignment: Stable lumbar lordosis since 2017. Mild retrolisthesis of L2 on L3, L3 on L4, L5 on S1. Vertebrae: No marrow edema or evidence of acute osseous abnormality. Visualized bone marrow signal is within normal limits. Intact visible sacrum and SI joints. Conus medullaris and cauda equina: Conus extends  to the T12-L1 level, minimally visible and negative. Paraspinal and other soft tissues: Negative. Disc levels: T12-L1:  Negative. L1-L2: Mild posterior disc bulge or protrusion is stable without stenosis. Mild left facet hypertrophy. L2-L3: Chronic circumferential disc bulge with broad-based posterior and foraminal components. Mild facet hypertrophy. Mild disc progression since 2017 along with new mild spinal stenosis. No convincing lateral recess  or foraminal stenosis. L3-L4: Disc desiccation with circumferential disc bulge. Chronic posterior annular fissure. Moderate posterior element hypertrophy and mild epidural lipomatosis. This level has progressed since 2017 with moderate spinal stenosis. Mild left lateral recess stenosis. Moderate left and mild right L3 foraminal stenosis. L4-L5: Mild circumferential disc bulge and moderate posterior element hypertrophy appears stable. No significant spinal or lateral recess stenosis. There is chronic moderate to severe left L4 foraminal stenosis on series 5, image 11. L5-S1: Circumferential disc bulge with mild posterior element hypertrophy. No spinal or lateral recess stenosis. Mild to moderate left and mild right L5 foraminal stenosis is stable. S1-S2: Full size disc space. Mostly sacralized. Probable left side assimilation joint. No stenosis. IMPRESSION: 1. 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 Correlation with radiographs is recommended prior to any operative intervention. 2. 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 Moderate multifactorial spinal stenosis at the latter with mild left lateral recess and moderate left foraminal stenosis. Query left L3 and/or L4 radiculitis. 3. Superimposed chronic moderate to severe left L4 foraminal stenosis also at L4-L5. Up to moderate chronic left L5 foraminal stenosis. Electronically Signed   By: Genevie Ann M.D.   On: 09/28/2019 07:29    Assessment & Plan:      Walker Kehr, MD

## 2020-03-02 NOTE — Assessment & Plan Note (Signed)
Yearly f/u w/Urology

## 2020-03-02 NOTE — Assessment & Plan Note (Signed)
On Prandin 

## 2020-03-08 DIAGNOSIS — M5416 Radiculopathy, lumbar region: Secondary | ICD-10-CM | POA: Diagnosis not present

## 2020-03-08 DIAGNOSIS — M545 Low back pain: Secondary | ICD-10-CM | POA: Diagnosis not present

## 2020-03-08 DIAGNOSIS — M1612 Unilateral primary osteoarthritis, left hip: Secondary | ICD-10-CM | POA: Diagnosis not present

## 2020-03-10 DIAGNOSIS — M1612 Unilateral primary osteoarthritis, left hip: Secondary | ICD-10-CM | POA: Diagnosis not present

## 2020-03-10 DIAGNOSIS — M545 Low back pain: Secondary | ICD-10-CM | POA: Diagnosis not present

## 2020-03-10 DIAGNOSIS — M5416 Radiculopathy, lumbar region: Secondary | ICD-10-CM | POA: Diagnosis not present

## 2020-03-21 ENCOUNTER — Telehealth: Payer: Self-pay | Admitting: Internal Medicine

## 2020-03-21 MED ORDER — ACCU-CHEK SMARTVIEW VI STRP: ORAL_STRIP | 3 refills | Status: DC

## 2020-03-21 NOTE — Telephone Encounter (Signed)
   Patient states pharmacy unable to refill glucose blood (ACCU-CHEK SMARTVIEW) test strip, stating they need a "code" [facility] [address] [place]

## 2020-03-21 NOTE — Telephone Encounter (Signed)
Resent prescription with Dx code

## 2020-03-24 ENCOUNTER — Other Ambulatory Visit: Payer: Self-pay | Admitting: Internal Medicine

## 2020-03-31 DIAGNOSIS — H40013 Open angle with borderline findings, low risk, bilateral: Secondary | ICD-10-CM | POA: Diagnosis not present

## 2020-03-31 DIAGNOSIS — H35372 Puckering of macula, left eye: Secondary | ICD-10-CM | POA: Diagnosis not present

## 2020-03-31 DIAGNOSIS — H353132 Nonexudative age-related macular degeneration, bilateral, intermediate dry stage: Secondary | ICD-10-CM | POA: Diagnosis not present

## 2020-03-31 DIAGNOSIS — E113292 Type 2 diabetes mellitus with mild nonproliferative diabetic retinopathy without macular edema, left eye: Secondary | ICD-10-CM | POA: Diagnosis not present

## 2020-03-31 LAB — HM DIABETES EYE EXAM

## 2020-04-07 ENCOUNTER — Other Ambulatory Visit: Payer: Self-pay

## 2020-04-07 MED ORDER — ACCU-CHEK SMARTVIEW VI STRP: ORAL_STRIP | 3 refills | Status: DC

## 2020-04-07 NOTE — Telephone Encounter (Signed)
The Dx CODE needs to be entered in order to receive test strips   1.Medication Requested:glucose blood (ACCU-CHEK SMARTVIEW) test strip  2. Pharmacy (Name, Street, [place]):[place], [place]  3. On Med List: Yes   4. Last Visit with PCP: 7.14.21  5. Next visit date with PCP: 10.19.21    Agent: Please be advised that RX refills may take up to 3 business days. We ask that you follow-up with your pharmacy.

## 2020-04-15 ENCOUNTER — Encounter: Payer: Self-pay | Admitting: Internal Medicine

## 2020-05-17 DIAGNOSIS — Z8546 Personal history of malignant neoplasm of prostate: Secondary | ICD-10-CM | POA: Diagnosis not present

## 2020-05-17 DIAGNOSIS — E0865 Diabetes mellitus due to underlying condition with hyperglycemia: Secondary | ICD-10-CM | POA: Diagnosis not present

## 2020-05-17 DIAGNOSIS — N5201 Erectile dysfunction due to arterial insufficiency: Secondary | ICD-10-CM | POA: Diagnosis not present

## 2020-05-17 DIAGNOSIS — N393 Stress incontinence (female) (male): Secondary | ICD-10-CM | POA: Diagnosis not present

## 2020-05-23 DIAGNOSIS — D1801 Hemangioma of skin and subcutaneous tissue: Secondary | ICD-10-CM | POA: Diagnosis not present

## 2020-05-23 DIAGNOSIS — L578 Other skin changes due to chronic exposure to nonionizing radiation: Secondary | ICD-10-CM | POA: Diagnosis not present

## 2020-05-23 DIAGNOSIS — L570 Actinic keratosis: Secondary | ICD-10-CM | POA: Diagnosis not present

## 2020-05-23 DIAGNOSIS — L821 Other seborrheic keratosis: Secondary | ICD-10-CM | POA: Diagnosis not present

## 2020-05-23 DIAGNOSIS — D0439 Carcinoma in situ of skin of other parts of face: Secondary | ICD-10-CM | POA: Diagnosis not present

## 2020-05-25 ENCOUNTER — Telehealth: Payer: Self-pay | Admitting: Internal Medicine

## 2020-05-25 NOTE — Telephone Encounter (Signed)
Patient is requesting a refill on the following medication.  glucose blood (ACCU-CHEK SMARTVIEW) test strip   Pharmacy on file.

## 2020-05-26 MED ORDER — ACCU-CHEK SMARTVIEW VI STRP: ORAL_STRIP | 3 refills | Status: DC

## 2020-05-26 NOTE — Telephone Encounter (Signed)
Okay.  Done. 

## 2020-06-07 ENCOUNTER — Ambulatory Visit: Payer: Medicare Other | Admitting: Internal Medicine

## 2020-06-15 ENCOUNTER — Encounter: Payer: Self-pay | Admitting: Internal Medicine

## 2020-06-15 ENCOUNTER — Other Ambulatory Visit: Payer: Self-pay | Admitting: Internal Medicine

## 2020-06-15 ENCOUNTER — Other Ambulatory Visit: Payer: Self-pay

## 2020-06-15 ENCOUNTER — Ambulatory Visit (INDEPENDENT_AMBULATORY_CARE_PROVIDER_SITE_OTHER): Payer: Medicare Other | Admitting: Internal Medicine

## 2020-06-15 VITALS — BP 112/68 | HR 65 | Temp 98.7°F | Wt 246.0 lb

## 2020-06-15 DIAGNOSIS — R202 Paresthesia of skin: Secondary | ICD-10-CM

## 2020-06-15 DIAGNOSIS — I25709 Atherosclerosis of coronary artery bypass graft(s), unspecified, with unspecified angina pectoris: Secondary | ICD-10-CM

## 2020-06-15 DIAGNOSIS — E785 Hyperlipidemia, unspecified: Secondary | ICD-10-CM

## 2020-06-15 DIAGNOSIS — Z23 Encounter for immunization: Secondary | ICD-10-CM

## 2020-06-15 DIAGNOSIS — R5383 Other fatigue: Secondary | ICD-10-CM | POA: Diagnosis not present

## 2020-06-15 DIAGNOSIS — E1159 Type 2 diabetes mellitus with other circulatory complications: Secondary | ICD-10-CM | POA: Diagnosis not present

## 2020-06-15 MED ORDER — ACCU-CHEK SMARTVIEW VI STRP: ORAL_STRIP | 3 refills | Status: DC

## 2020-06-15 MED ORDER — REPAGLINIDE 1 MG PO TABS: 1.0000 mg | ORAL_TABLET | Freq: Three times a day (TID) | ORAL | 1 refills | Status: DC

## 2020-06-15 MED ORDER — SIMVASTATIN 40 MG PO TABS: 40.0000 mg | ORAL_TABLET | Freq: Every evening | ORAL | 3 refills | Status: DC

## 2020-06-15 MED ORDER — CARVEDILOL 25 MG PO TABS: 25.0000 mg | ORAL_TABLET | Freq: Two times a day (BID) | ORAL | 1 refills | Status: DC

## 2020-06-15 NOTE — Assessment & Plan Note (Signed)
Simvastatin, Coreg

## 2020-06-15 NOTE — Progress Notes (Signed)
Subjective:  Patient ID: Larry [name], male    DOB: 05-11-1941  Age: 79 y.o. MRN: [id number]  CC: Follow-up (3 month F/U- Flu shot)   HPI Larry [name] presents for DM, CAD, hydralazine f/u C/o fatigue  Outpatient Medications Prior to Visit  Medication Sig Dispense Refill  . Accu-Chek FastClix Lancets MISC USE 1  TO CHECK GLUCOSE TWICE DAILY 100 each 11  . aspirin 325 MG tablet Take 1 tablet (325 mg total) by mouth daily. 60 tablet 0  . Cholecalciferol (VITAMIN D3) 1000 UNITS CAPS Take 1,000 Units by mouth daily.     . folic acid (FOLVITE) 1 MG tablet Take 1 tablet by mouth once daily 90 tablet 3  . furosemide (LASIX) 40 MG tablet Take 1 tablet (40 mg total) by mouth daily. 90 tablet 3  . hydrALAZINE (APRESOLINE) 50 MG tablet Take 1 tablet (50 mg total) by mouth 3 (three) times daily. 270 tablet 3  . metFORMIN (GLUCOPHAGE) 1000 MG tablet TAKE 1 TABLET BY MOUTH TWICE DAILY WITH A MEAL. 180 tablet 3  . Multiple Vitamins-Minerals (MACULAR VITAMIN BENEFIT) TABS Take 1 tablet by mouth 2 (two) times daily.    . nitroGLYCERIN (NITROSTAT) 0.4 MG SL tablet Place 1 tablet (0.4 mg total) under the tongue every 5 (five) minutes as needed for chest pain. 20 tablet 3  . simvastatin (ZOCOR) 40 MG tablet Take 1 tablet (40 mg total) by mouth every evening. 90 tablet 3  . thiamine 100 MG tablet Take 1 tablet (100 mg total) by mouth daily. 90 tablet 3  . carvedilol (COREG) 25 MG tablet Take 1 tablet by mouth twice daily (Patient taking differently: Take 25 mg by mouth daily. ) 180 tablet 1  . glucose blood (ACCU-CHEK SMARTVIEW) test strip USE 1 STRIP TO CHECK GLUCOSE TWICE DAILY E11.9 100 each 3  . glucose monitoring kit (FREESTYLE) monitoring kit 1 each by Does not apply route as needed for other. Please dispense brand of choice per ins/patient. Dx: 250.00. 1 each 0  . repaglinide (PRANDIN) 1 MG tablet Take 1 tablet (1 mg total) by mouth 3 (three) times daily before meals. 270 tablet 3   No  facility-administered medications prior to visit.    ROS: Review of Systems  Constitutional: Positive for fatigue. Negative for appetite change and unexpected weight change.  HENT: Negative for congestion, nosebleeds, sneezing, sore throat and trouble swallowing.   Eyes: Negative for itching and visual disturbance.  Respiratory: Negative for cough.   Cardiovascular: Positive for leg swelling. Negative for chest pain and palpitations.  Gastrointestinal: Negative for abdominal distention, blood in stool, diarrhea and nausea.  Genitourinary: Negative for frequency and hematuria.  Musculoskeletal: Positive for back pain. Negative for gait problem, joint swelling and neck pain.  Skin: Negative for rash.  Neurological: Negative for dizziness, tremors, speech difficulty and weakness.  Psychiatric/Behavioral: Negative for agitation, dysphoric mood, sleep disturbance and suicidal ideas. The patient is not nervous/anxious.     Objective:  BP 112/68 (BP Location: Left Arm)   Pulse 65   Temp 98.7 F (37.1 C) (Oral)   Wt 246 lb (111.6 kg)   SpO2 96%   BMI 33.36 kg/m   BP Readings from Last 3 Encounters:  06/15/20 112/68  03/02/20 (!) 132/40  01/25/20 120/60    Wt Readings from Last 3 Encounters:  06/15/20 246 lb (111.6 kg)  03/02/20 236 lb (107 kg)  01/25/20 240 lb (108.9 kg)    Physical Exam Constitutional:  General: He is not in acute distress.    Appearance: He is well-developed. He is obese.     Comments: NAD  Eyes:     Conjunctiva/sclera: Conjunctivae normal.     Pupils: Pupils are equal, round, and reactive to light.  Neck:     Thyroid: No thyromegaly.     Vascular: No JVD.  Cardiovascular:     Rate and Rhythm: Normal rate and regular rhythm.     Heart sounds: Normal heart sounds. No murmur heard.  No friction rub. No gallop.   Pulmonary:     Effort: Pulmonary effort is normal. No respiratory distress.     Breath sounds: Normal breath sounds. No wheezing or  rales.  Chest:     Chest wall: No tenderness.  Abdominal:     General: Bowel sounds are normal. There is no distension.     Palpations: Abdomen is soft. There is no mass.     Tenderness: There is no abdominal tenderness. There is no guarding or rebound.  Musculoskeletal:        General: No tenderness. Normal range of motion.     Cervical back: Normal range of motion.     Right lower leg: Edema present.     Left lower leg: Edema present.  Lymphadenopathy:     Cervical: No cervical adenopathy.  Skin:    General: Skin is warm and dry.     Findings: No rash.  Neurological:     Mental Status: He is alert and oriented to person, place, and time.     Cranial Nerves: No cranial nerve deficit.     Motor: No abnormal muscle tone.     Coordination: Coordination normal.     Gait: Gait normal.     Deep Tendon Reflexes: Reflexes are normal and symmetric.  Psychiatric:        Behavior: Behavior normal.        Thought Content: Thought content normal.        Judgment: Judgment normal.   1+ B edema  Lab Results  Component Value Date   WBC 9.2 06/24/2019   HGB 12.1 (L) 06/24/2019   HCT 37.0 (L) 06/24/2019   PLT 236.0 06/24/2019   GLUCOSE 160 (H) 01/25/2020   CHOL 129 11/20/2018   TRIG 113 11/20/2018   HDL 38 (L) 11/20/2018   LDLCALC 68 11/20/2018   ALT 13 10/22/2019   AST 13 10/22/2019   NA 140 01/25/2020   K 4.2 01/25/2020   CL 103 01/25/2020   CREATININE 1.28 01/25/2020   BUN 26 (H) 01/25/2020   CO2 30 01/25/2020   TSH 2.18 06/24/2019   PSA 0.00 Repeated and verified X2. (L) 12/26/2012   INR 1.0 11/19/2018   HGBA1C 7.0 (H) 01/25/2020    MR Lumbar Spine Wo Contrast  Result Date: 09/28/2019 CLINICAL DATA:  80 year old male with low back pain radiating down the left leg since July. Symptoms after lawnmower accident. EXAM: MRI LUMBAR SPINE WITHOUT CONTRAST TECHNIQUE: Multiplanar, multisequence MR imaging of the lumbar spine was performed. No intravenous contrast was administered.  COMPARISON:  Lumbar MRI 04/04/2016. Abdominal radiograph 11/20/2018. FINDINGS: Segmentation: Transitional anatomy. For the purposes of this report the same numbering system will be used as on the 2017 radiographs which designates a partially lumbarized S1 level with full size S1-S2 disc space. By this numbering system there are hypoplastic ribs at L1 (series 13 image 1). Correlation with radiographs is recommended prior to any operative intervention. Alignment: Stable lumbar lordosis since 2017.  Mild retrolisthesis of L2 on L3, L3 on L4, L5 on S1. Vertebrae: No marrow edema or evidence of acute osseous abnormality. Visualized bone marrow signal is within normal limits. Intact visible sacrum and SI joints. Conus medullaris and cauda equina: Conus extends to the T12-L1 level, minimally visible and negative. Paraspinal and other soft tissues: Negative. Disc levels: T12-L1:  Negative. L1-L2: Mild posterior disc bulge or protrusion is stable without stenosis. Mild left facet hypertrophy. L2-L3: Chronic circumferential disc bulge with broad-based posterior and foraminal components. Mild facet hypertrophy. Mild disc progression since 2017 along with new mild spinal stenosis. No convincing lateral recess or foraminal stenosis. L3-L4: Disc desiccation with circumferential disc bulge. Chronic posterior annular fissure. Moderate posterior element hypertrophy and mild epidural lipomatosis. This level has progressed since 2017 with moderate spinal stenosis. Mild left lateral recess stenosis. Moderate left and mild right L3 foraminal stenosis. L4-L5: Mild circumferential disc bulge and moderate posterior element hypertrophy appears stable. No significant spinal or lateral recess stenosis. There is chronic moderate to severe left L4 foraminal stenosis on series 5, image 11. L5-S1: Circumferential disc bulge with mild posterior element hypertrophy. No spinal or lateral recess stenosis. Mild to moderate left and mild right L5  foraminal stenosis is stable. S1-S2: Full size disc space. Mostly sacralized. Probable left side assimilation joint. No stenosis. IMPRESSION: 1. 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 Correlation with radiographs is recommended prior to any operative intervention. 2. 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 Moderate multifactorial spinal stenosis at the latter with mild left lateral recess and moderate left foraminal stenosis. Query left L3 and/or L4 radiculitis. 3. Superimposed chronic moderate to severe left L4 foraminal stenosis also at L4-L5. Up to moderate chronic left L5 foraminal stenosis. Electronically Signed   By: Genevie Ann M.D.   On: 09/28/2019 07:29    Assessment & Plan:   Beulah was seen today for follow-up.  Diagnoses and all orders for this visit:  Needs flu shot -     Flu Vaccine QUAD High Dose(Fluad)  Other orders -     carvedilol (COREG) 25 MG tablet; Take 1 tablet (25 mg total) by mouth 2 (two) times daily. -     repaglinide (PRANDIN) 1 MG tablet; Take 1 tablet (1 mg total) by mouth 3 (three) times daily before meals. -     glucose blood (ACCU-CHEK SMARTVIEW) test strip; USE 1 STRIP TO CHECK GLUCOSE TWICE DAILY E11.9     Meds ordered this encounter  Medications  . carvedilol (COREG) 25 MG tablet    Sig: Take 1 tablet (25 mg total) by mouth 2 (two) times daily.    Dispense:  180 tablet    Refill:  1  . repaglinide (PRANDIN) 1 MG tablet    Sig: Take 1 tablet (1 mg total) by mouth 3 (three) times daily before meals.    Dispense:  270 tablet    Refill:  1  . glucose blood (ACCU-CHEK SMARTVIEW) test strip    Sig: USE 1 STRIP TO CHECK GLUCOSE TWICE DAILY E11.9    Dispense:  180 each    Refill:  3    Dx E11.9     Follow-up: No follow-ups on file.  Walker Kehr, MD

## 2020-06-15 NOTE — Assessment & Plan Note (Signed)
Reduce Coreg in half Reduce Hydralazine in half Labs

## 2020-06-15 NOTE — Assessment & Plan Note (Signed)
Labs

## 2020-06-15 NOTE — Assessment & Plan Note (Signed)
Simvastatin 

## 2020-06-16 ENCOUNTER — Other Ambulatory Visit (INDEPENDENT_AMBULATORY_CARE_PROVIDER_SITE_OTHER): Payer: Medicare Other

## 2020-06-16 DIAGNOSIS — E1159 Type 2 diabetes mellitus with other circulatory complications: Secondary | ICD-10-CM

## 2020-06-16 DIAGNOSIS — Z23 Encounter for immunization: Secondary | ICD-10-CM | POA: Diagnosis not present

## 2020-06-16 DIAGNOSIS — E785 Hyperlipidemia, unspecified: Secondary | ICD-10-CM | POA: Diagnosis not present

## 2020-06-16 DIAGNOSIS — R202 Paresthesia of skin: Secondary | ICD-10-CM | POA: Diagnosis not present

## 2020-06-16 LAB — COMPREHENSIVE METABOLIC PANEL
ALT: 12 U/L (ref 0–53)
AST: 13 U/L (ref 0–37)
Albumin: 4 g/dL (ref 3.5–5.2)
Alkaline Phosphatase: 61 U/L (ref 39–117)
BUN: 27 mg/dL — ABNORMAL HIGH (ref 6–23)
CO2: 28 mEq/L (ref 19–32)
Calcium: 9.2 mg/dL (ref 8.4–10.5)
Chloride: 103 mEq/L (ref 96–112)
Creatinine, Ser: 1.33 mg/dL (ref 0.40–1.50)
GFR: 51.16 mL/min — ABNORMAL LOW (ref >60.00)
Glucose, Bld: 188 mg/dL — ABNORMAL HIGH (ref 70–99)
Potassium: 4 mEq/L (ref 3.5–5.1)
Sodium: 141 mEq/L (ref 135–145)
Total Bilirubin: 0.6 mg/dL (ref 0.2–1.2)
Total Protein: 6.4 g/dL (ref 6.0–8.3)

## 2020-06-16 LAB — CBC WITH DIFFERENTIAL/PLATELET
Basophils Absolute: 0.1 10*3/uL (ref 0.0–0.1)
Basophils Relative: 1.2 % (ref 0.0–3.0)
Eosinophils Absolute: 0.2 10*3/uL (ref 0.0–0.7)
Eosinophils Relative: 1.8 % (ref 0.0–5.0)
HCT: 39.4 % (ref 39.0–52.0)
Hemoglobin: 13.1 g/dL (ref 13.0–17.0)
Lymphocytes Relative: 32 % (ref 12.0–46.0)
Lymphs Abs: 3 10*3/uL (ref 0.7–4.0)
MCHC: 33.2 g/dL (ref 30.0–36.0)
MCV: 90.7 fl (ref 78.0–100.0)
Monocytes Absolute: 0.6 10*3/uL (ref 0.1–1.0)
Monocytes Relative: 6.6 % (ref 3.0–12.0)
Neutro Abs: 5.5 10*3/uL (ref 1.4–7.7)
Neutrophils Relative %: 58.4 % (ref 43.0–77.0)
Platelets: 240 10*3/uL (ref 150.0–400.0)
RBC: 4.34 Mil/uL (ref 4.22–5.81)
RDW: 13.2 % (ref 11.5–15.5)
WBC: 9.4 10*3/uL (ref 4.0–10.5)

## 2020-06-16 LAB — VITAMIN B12: Vitamin B-12: 230 pg/mL (ref 211–911)

## 2020-06-16 LAB — LIPID PANEL
Cholesterol: 125 mg/dL (ref 0–200)
HDL: 41.1 mg/dL (ref >39.00)
LDL Cholesterol: 64 mg/dL (ref 0–99)
NonHDL: 84.26
Total CHOL/HDL Ratio: 3
Triglycerides: 99 mg/dL (ref 0.0–149.0)
VLDL: 19.8 mg/dL (ref 0.0–40.0)

## 2020-06-16 LAB — URINALYSIS
Bilirubin Urine: NEGATIVE
Hgb urine dipstick: NEGATIVE
Leukocytes,Ua: NEGATIVE
Nitrite: NEGATIVE
Specific Gravity, Urine: 1.025 (ref 1.000–1.030)
Total Protein, Urine: NEGATIVE
Urine Glucose: NEGATIVE
Urobilinogen, UA: 0.2 (ref 0.0–1.0)
pH: 6 (ref 5.0–8.0)

## 2020-06-16 LAB — TSH: TSH: 2.62 u[IU]/mL (ref 0.35–4.50)

## 2020-06-16 LAB — T4, FREE: Free T4: 0.87 ng/dL (ref 0.60–1.60)

## 2020-06-20 ENCOUNTER — Encounter: Payer: Self-pay | Admitting: Internal Medicine

## 2020-06-20 ENCOUNTER — Other Ambulatory Visit: Payer: Self-pay | Admitting: Internal Medicine

## 2020-06-20 DIAGNOSIS — E538 Deficiency of other specified B group vitamins: Secondary | ICD-10-CM | POA: Insufficient documentation

## 2020-06-20 MED ORDER — VITAMIN B-12 1000 MCG SL SUBL: 1.0000 | SUBLINGUAL_TABLET | Freq: Every day | SUBLINGUAL | 3 refills | Status: AC

## 2020-06-22 ENCOUNTER — Telehealth: Payer: Self-pay | Admitting: Emergency Medicine

## 2020-06-22 MED ORDER — ACCU-CHEK SMARTVIEW VI STRP: ORAL_STRIP | 3 refills | Status: DC

## 2020-06-22 NOTE — Telephone Encounter (Signed)
Pt is requesting his prescription for his glucose blood test strip be written for once a day instead of twice a day. He is not insulin dependent so his insurance will only pay for 1X daily. Thanks

## 2020-06-22 NOTE — Telephone Encounter (Signed)
Pharmacy is [address]

## 2020-08-02 ENCOUNTER — Other Ambulatory Visit: Payer: Self-pay

## 2020-08-02 ENCOUNTER — Ambulatory Visit (HOSPITAL_COMMUNITY): Payer: Medicare Other | Attending: Cardiology

## 2020-08-02 DIAGNOSIS — I5042 Chronic combined systolic (congestive) and diastolic (congestive) heart failure: Secondary | ICD-10-CM | POA: Diagnosis not present

## 2020-08-02 LAB — ECHOCARDIOGRAM COMPLETE
Area-P 1/2: 4.31 cm2
S' Lateral: 3.8 cm

## 2020-09-15 ENCOUNTER — Encounter: Payer: Self-pay | Admitting: Internal Medicine

## 2020-09-15 ENCOUNTER — Other Ambulatory Visit: Payer: Self-pay

## 2020-09-15 ENCOUNTER — Ambulatory Visit (INDEPENDENT_AMBULATORY_CARE_PROVIDER_SITE_OTHER): Payer: Medicare Other | Admitting: Internal Medicine

## 2020-09-15 DIAGNOSIS — E1159 Type 2 diabetes mellitus with other circulatory complications: Secondary | ICD-10-CM

## 2020-09-15 DIAGNOSIS — E538 Deficiency of other specified B group vitamins: Secondary | ICD-10-CM

## 2020-09-15 DIAGNOSIS — E662 Morbid (severe) obesity with alveolar hypoventilation: Secondary | ICD-10-CM | POA: Diagnosis not present

## 2020-09-15 DIAGNOSIS — I10 Essential (primary) hypertension: Secondary | ICD-10-CM | POA: Diagnosis not present

## 2020-09-15 LAB — COMPREHENSIVE METABOLIC PANEL
ALT: 14 U/L (ref 0–53)
AST: 18 U/L (ref 0–37)
Albumin: 4 g/dL (ref 3.5–5.2)
Alkaline Phosphatase: 68 U/L (ref 39–117)
BUN: 23 mg/dL (ref 6–23)
CO2: 32 mEq/L (ref 19–32)
Calcium: 9.4 mg/dL (ref 8.4–10.5)
Chloride: 104 mEq/L (ref 96–112)
Creatinine, Ser: 1.26 mg/dL (ref 0.40–1.50)
GFR: 54.5 mL/min — ABNORMAL LOW (ref >60.00)
Glucose, Bld: 135 mg/dL — ABNORMAL HIGH (ref 70–99)
Potassium: 4.1 mEq/L (ref 3.5–5.1)
Sodium: 140 mEq/L (ref 135–145)
Total Bilirubin: 0.5 mg/dL (ref 0.2–1.2)
Total Protein: 6.8 g/dL (ref 6.0–8.3)

## 2020-09-15 LAB — HEMOGLOBIN A1C: Hgb A1c MFr Bld: 7.1 % — ABNORMAL HIGH (ref 4.6–6.5)

## 2020-09-15 NOTE — Assessment & Plan Note (Signed)
Coreg to 25 mg twice a day and hydralazine 50 mg 3 times a day. NAS diet

## 2020-09-15 NOTE — Assessment & Plan Note (Signed)
On Vit B12 

## 2020-09-15 NOTE — Assessment & Plan Note (Signed)
A1c °CMET °

## 2020-09-15 NOTE — Assessment & Plan Note (Signed)
[name] lost wt

## 2020-09-15 NOTE — Progress Notes (Signed)
Subjective:  Patient ID: Larry [name], male    DOB: 19-Jun-1941  Age: 80 y.o. MRN: [id number]  CC: Follow-up (3 MONTH F/U)   HPI Larry [name] presents for DM, CAD, HTN f/u  Outpatient Medications Prior to Visit  Medication Sig Dispense Refill  . Accu-Chek FastClix Lancets MISC USE 1  TO CHECK GLUCOSE TWICE DAILY 100 each 11  . aspirin 325 MG tablet Take 1 tablet (325 mg total) by mouth daily. 60 tablet 0  . carvedilol (COREG) 12.5 MG tablet TAKE 1 TABLET BY MOUTH TWICE DAILY WITH MEALS 180 tablet 1  . Cholecalciferol (VITAMIN D3) 1000 UNITS CAPS Take 1,000 Units by mouth daily.    . Cyanocobalamin (VITAMIN B-12) 1000 MCG SUBL Place 1 tablet (1,000 mcg total) under the tongue daily. 350 tablet 3  . folic acid (FOLVITE) 1 MG tablet Take 1 tablet by mouth once daily 90 tablet 3  . furosemide (LASIX) 40 MG tablet Take 1 tablet (40 mg total) by mouth daily. 90 tablet 3  . glucose blood (ACCU-CHEK SMARTVIEW) test strip USE 1 STRIP TO CHECK GLUCOSE TWICE DAILY E11.9 180 each 3  . glucose blood (ACCU-CHEK SMARTVIEW) test strip USE 1 STRIP TO CHECK GLUCOSE  DAILY 90 each 3  . hydrALAZINE (APRESOLINE) 50 MG tablet Take 1 tablet (50 mg total) by mouth 3 (three) times daily. (Patient taking differently: Take 50 mg by mouth 3 (three) times daily. TAKE 1/2 TABLET THREE TIMES A DAY) 270 tablet 3  . metFORMIN (GLUCOPHAGE) 1000 MG tablet TAKE 1 TABLET BY MOUTH TWICE DAILY WITH A MEAL. 180 tablet 3  . Multiple Vitamins-Minerals (MACULAR VITAMIN BENEFIT) TABS Take 1 tablet by mouth 2 (two) times daily.    . nitroGLYCERIN (NITROSTAT) 0.4 MG SL tablet Place 1 tablet (0.4 mg total) under the tongue every 5 (five) minutes as needed for chest pain. 20 tablet 3  . repaglinide (PRANDIN) 1 MG tablet Take 1 tablet (1 mg total) by mouth 3 (three) times daily before meals. 270 tablet 1  . simvastatin (ZOCOR) 40 MG tablet Take 1 tablet (40 mg total) by mouth every evening. 90 tablet 3  . thiamine 100 MG tablet  Take 1 tablet (100 mg total) by mouth daily. 90 tablet 3  . PREVIDENT 5000 DRY MOUTH 1.1 % GEL dental gel USE ONCE DAILY IN PLACE OF REGULAR TOOTHPASTE.    . carvedilol (COREG) 25 MG tablet Take 1 tablet (25 mg total) by mouth 2 (two) times daily. 180 tablet 1   No facility-administered medications prior to visit.    ROS: Review of Systems  Constitutional: Negative for appetite change, fatigue and unexpected weight change.  HENT: Negative for congestion, nosebleeds, sneezing, sore throat and trouble swallowing.   Eyes: Negative for itching and visual disturbance.  Respiratory: Negative for cough.   Cardiovascular: Positive for leg swelling. Negative for chest pain and palpitations.  Gastrointestinal: Negative for abdominal distention, blood in stool, diarrhea and nausea.  Genitourinary: Negative for frequency and hematuria.  Musculoskeletal: Negative for back pain, gait problem, joint swelling and neck pain.  Skin: Negative for rash.  Neurological: Negative for dizziness, tremors, speech difficulty and weakness.  Psychiatric/Behavioral: Negative for agitation, dysphoric mood, sleep disturbance and suicidal ideas. The patient is not nervous/anxious.     Objective:  BP 138/80 (BP Location: Left Arm)   Pulse 65   Temp 97.8 F (36.6 C) (Oral)   Ht 6' (1.829 m)   Wt 237 lb 9.6 oz (107.8 kg)  SpO2 96%   BMI 32.22 kg/m   BP Readings from Last 3 Encounters:  09/15/20 138/80  06/15/20 112/68  03/02/20 (!) 132/40    Wt Readings from Last 3 Encounters:  09/15/20 237 lb 9.6 oz (107.8 kg)  06/15/20 246 lb (111.6 kg)  03/02/20 236 lb (107 kg)    Physical Exam Constitutional:      General: He is not in acute distress.    Appearance: He is well-developed. He is obese.     Comments: NAD  HENT:     Mouth/Throat:     Mouth: Oropharynx is clear and moist.  Eyes:     Conjunctiva/sclera: Conjunctivae normal.     Pupils: Pupils are equal, round, and reactive to light.  Neck:      Thyroid: No thyromegaly.     Vascular: No JVD.  Cardiovascular:     Rate and Rhythm: Normal rate and regular rhythm.     Pulses: Intact distal pulses.     Heart sounds: Normal heart sounds. No murmur heard. No friction rub. No gallop.   Pulmonary:     Effort: Pulmonary effort is normal. No respiratory distress.     Breath sounds: Normal breath sounds. No wheezing or rales.  Chest:     Chest wall: No tenderness.  Abdominal:     General: Bowel sounds are normal. There is no distension.     Palpations: Abdomen is soft. There is no mass.     Tenderness: There is no abdominal tenderness. There is no guarding or rebound.  Musculoskeletal:        General: No tenderness. Normal range of motion.     Cervical back: Normal range of motion.     Right lower leg: Edema present.     Left lower leg: Edema present.  Lymphadenopathy:     Cervical: No cervical adenopathy.  Skin:    General: Skin is warm and dry.     Findings: No rash.  Neurological:     Mental Status: He is alert and oriented to person, place, and time.     Cranial Nerves: No cranial nerve deficit.     Motor: No abnormal muscle tone.     Coordination: He displays a negative Romberg sign. Coordination normal.     Gait: Gait normal.     Deep Tendon Reflexes: Reflexes are normal and symmetric.  Psychiatric:        Mood and Affect: Mood and affect normal.        Behavior: Behavior normal.        Thought Content: Thought content normal.        Judgment: Judgment normal.    Trace edema   Lab Results  Component Value Date   WBC 9.4 06/16/2020   HGB 13.1 06/16/2020   HCT 39.4 06/16/2020   PLT 240.0 06/16/2020   GLUCOSE 188 (H) 06/16/2020   CHOL 125 06/16/2020   TRIG 99.0 06/16/2020   HDL 41.10 06/16/2020   LDLCALC 64 06/16/2020   ALT 12 06/16/2020   AST 13 06/16/2020   NA 141 06/16/2020   K 4.0 06/16/2020   CL 103 06/16/2020   CREATININE 1.33 06/16/2020   BUN 27 (H) 06/16/2020   CO2 28 06/16/2020   TSH 2.62  06/16/2020   PSA 0.00 Repeated and verified X2. (L) 12/26/2012   INR 1.0 11/19/2018   HGBA1C 7.0 (H) 01/25/2020    MR Lumbar Spine Wo Contrast  Result Date: 09/28/2019 CLINICAL DATA:  80 year old male with low back  pain radiating down the left leg since July. Symptoms after lawnmower accident. EXAM: MRI LUMBAR SPINE WITHOUT CONTRAST TECHNIQUE: Multiplanar, multisequence MR imaging of the lumbar spine was performed. No intravenous contrast was administered. COMPARISON:  Lumbar MRI 04/04/2016. Abdominal radiograph 11/20/2018. FINDINGS: Segmentation: Transitional anatomy. For the purposes of this report the same numbering system will be used as on the 2017 radiographs which designates a partially lumbarized S1 level with full size S1-S2 disc space. By this numbering system there are hypoplastic ribs at L1 (series 13 image 1). Correlation with radiographs is recommended prior to any operative intervention. Alignment: Stable lumbar lordosis since 2017. Mild retrolisthesis of L2 on L3, L3 on L4, L5 on S1. Vertebrae: No marrow edema or evidence of acute osseous abnormality. Visualized bone marrow signal is within normal limits. Intact visible sacrum and SI joints. Conus medullaris and cauda equina: Conus extends to the T12-L1 level, minimally visible and negative. Paraspinal and other soft tissues: Negative. Disc levels: T12-L1:  Negative. L1-L2: Mild posterior disc bulge or protrusion is stable without stenosis. Mild left facet hypertrophy. L2-L3: Chronic circumferential disc bulge with broad-based posterior and foraminal components. Mild facet hypertrophy. Mild disc progression since 2017 along with new mild spinal stenosis. No convincing lateral recess or foraminal stenosis. L3-L4: Disc desiccation with circumferential disc bulge. Chronic posterior annular fissure. Moderate posterior element hypertrophy and mild epidural lipomatosis. This level has progressed since 2017 with moderate spinal stenosis. Mild left  lateral recess stenosis. Moderate left and mild right L3 foraminal stenosis. L4-L5: Mild circumferential disc bulge and moderate posterior element hypertrophy appears stable. No significant spinal or lateral recess stenosis. There is chronic moderate to severe left L4 foraminal stenosis on series 5, image 11. L5-S1: Circumferential disc bulge with mild posterior element hypertrophy. No spinal or lateral recess stenosis. Mild to moderate left and mild right L5 foraminal stenosis is stable. S1-S2: Full size disc space. Mostly sacralized. Probable left side assimilation joint. No stenosis. IMPRESSION: 1. 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 Correlation with radiographs is recommended prior to any operative intervention. 2. 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 Moderate multifactorial spinal stenosis at the latter with mild left lateral recess and moderate left foraminal stenosis. Query left L3 and/or L4 radiculitis. 3. Superimposed chronic moderate to severe left L4 foraminal stenosis also at L4-L5. Up to moderate chronic left L5 foraminal stenosis. Electronically Signed   By: Genevie Ann M.D.   On: 09/28/2019 07:29    Assessment & Plan:    Walker Kehr, MD

## 2020-09-22 ENCOUNTER — Telehealth: Payer: Self-pay | Admitting: Internal Medicine

## 2020-09-22 MED ORDER — HYDRALAZINE HCL 50 MG PO TABS: 25.0000 mg | ORAL_TABLET | Freq: Three times a day (TID) | ORAL | 1 refills | Status: DC

## 2020-09-22 NOTE — Telephone Encounter (Signed)
Called pt/wife she states wanted to make sure if husband suppose to take the whole hydralazine or 1/2 like he was doing b4. She states MD reduce it to 1/2 of the 50 mg three times a day. Verified w/Dr. Camila Li pt is to continue taking 1/2 50 mg tid. She states he will need new rx a sent to [facility]. Updated med list and sent rx to [facility]Marland KitchenJohny Chess

## 2020-09-22 NOTE — Telephone Encounter (Signed)
   Spouse calling to clarify dosage of hydrALAZINE (APRESOLINE)  She states patient was taking  25mg .  Please refill to [facility] [place], [place]

## 2020-09-26 ENCOUNTER — Other Ambulatory Visit: Payer: Self-pay

## 2020-09-27 ENCOUNTER — Encounter: Payer: Self-pay | Admitting: Internal Medicine

## 2020-09-27 ENCOUNTER — Ambulatory Visit (INDEPENDENT_AMBULATORY_CARE_PROVIDER_SITE_OTHER): Payer: Medicare Other | Admitting: Internal Medicine

## 2020-09-27 DIAGNOSIS — H6693 Otitis media, unspecified, bilateral: Secondary | ICD-10-CM | POA: Diagnosis not present

## 2020-09-27 DIAGNOSIS — H9193 Unspecified hearing loss, bilateral: Secondary | ICD-10-CM

## 2020-09-27 DIAGNOSIS — E538 Deficiency of other specified B group vitamins: Secondary | ICD-10-CM | POA: Diagnosis not present

## 2020-09-27 DIAGNOSIS — H6690 Otitis media, unspecified, unspecified ear: Secondary | ICD-10-CM | POA: Insufficient documentation

## 2020-09-27 DIAGNOSIS — H9190 Unspecified hearing loss, unspecified ear: Secondary | ICD-10-CM | POA: Insufficient documentation

## 2020-09-27 MED ORDER — LORATADINE 10 MG PO TABS: 10.0000 mg | ORAL_TABLET | Freq: Every day | ORAL | 3 refills | Status: DC

## 2020-09-27 MED ORDER — AFRIN NASAL SPRAY 0.05 % NA SOLN: 1.0000 | Freq: Two times a day (BID) | NASAL | 0 refills | Status: DC

## 2020-09-27 MED ORDER — CEFDINIR 300 MG PO CAPS: 300.0000 mg | ORAL_CAPSULE | Freq: Two times a day (BID) | ORAL | 0 refills | Status: DC

## 2020-09-27 MED ORDER — FLUTICASONE PROPIONATE 50 MCG/ACT NA SUSP: 2.0000 | Freq: Every day | NASAL | 6 refills | Status: DC

## 2020-09-27 MED ORDER — HYDRALAZINE HCL 25 MG PO TABS: 25.0000 mg | ORAL_TABLET | Freq: Three times a day (TID) | ORAL | 1 refills | Status: DC

## 2020-09-27 NOTE — Assessment & Plan Note (Signed)
B - Claritin, Flonase Omnicef Afrin x 1 wk

## 2020-09-27 NOTE — Assessment & Plan Note (Addendum)
Claritin, Flonase Omnicef Afrin x 1 wk ENT ref if not better

## 2020-09-27 NOTE — Assessment & Plan Note (Signed)
On B12 

## 2020-09-27 NOTE — Progress Notes (Signed)
Subjective:  Patient ID: Larry [name], male    DOB: 1941/01/24  Age: 80 y.o. MRN: [id number]  CC: Hearing Loss (Pt also want to get rx for 25 mg for hydralazine instead of cutting the 50 mg in half)   HPI Larry [name] presents for   Outpatient Medications Prior to Visit  Medication Sig Dispense Refill  . Accu-Chek FastClix Lancets MISC USE 1  TO CHECK GLUCOSE TWICE DAILY 100 each 11  . aspirin 325 MG tablet Take 1 tablet (325 mg total) by mouth daily. 60 tablet 0  . carvedilol (COREG) 12.5 MG tablet TAKE 1 TABLET BY MOUTH TWICE DAILY WITH MEALS 180 tablet 1  . Cholecalciferol (VITAMIN D3) 1000 UNITS CAPS Take 1,000 Units by mouth daily.    . Cyanocobalamin (VITAMIN B-12) 1000 MCG SUBL Place 1 tablet (1,000 mcg total) under the tongue daily. 546 tablet 3  . folic acid (FOLVITE) 1 MG tablet Take 1 tablet by mouth once daily 90 tablet 3  . furosemide (LASIX) 40 MG tablet Take 1 tablet (40 mg total) by mouth daily. 90 tablet 3  . glucose blood (ACCU-CHEK SMARTVIEW) test strip USE 1 STRIP TO CHECK GLUCOSE TWICE DAILY E11.9 180 each 3  . glucose blood (ACCU-CHEK SMARTVIEW) test strip USE 1 STRIP TO CHECK GLUCOSE  DAILY 90 each 3  . metFORMIN (GLUCOPHAGE) 1000 MG tablet TAKE 1 TABLET BY MOUTH TWICE DAILY WITH A MEAL. 180 tablet 3  . Multiple Vitamins-Minerals (MACULAR VITAMIN BENEFIT) TABS Take 1 tablet by mouth 2 (two) times daily.    . nitroGLYCERIN (NITROSTAT) 0.4 MG SL tablet Place 1 tablet (0.4 mg total) under the tongue every 5 (five) minutes as needed for chest pain. 20 tablet 3  . PREVIDENT 5000 DRY MOUTH 1.1 % GEL dental gel USE ONCE DAILY IN PLACE OF REGULAR TOOTHPASTE.    . repaglinide (PRANDIN) 1 MG tablet Take 1 tablet (1 mg total) by mouth 3 (three) times daily before meals. 270 tablet 1  . simvastatin (ZOCOR) 40 MG tablet Take 1 tablet (40 mg total) by mouth every evening. 90 tablet 3  . thiamine 100 MG tablet Take 1 tablet (100 mg total) by mouth daily. 90 tablet 3  .  hydrALAZINE (APRESOLINE) 25 MG tablet Take 25 mg by mouth 3 (three) times daily.    . hydrALAZINE (APRESOLINE) 50 MG tablet Take 0.5 tablets (25 mg total) by mouth 3 (three) times daily. 270 tablet 1   No facility-administered medications prior to visit.    ROS: Review of Systems  Constitutional: Negative for appetite change, fatigue and unexpected weight change.  HENT: Positive for congestion and hearing loss. Negative for ear discharge, nosebleeds, sneezing, sore throat and trouble swallowing.   Eyes: Negative for itching and visual disturbance.  Respiratory: Negative for cough.   Cardiovascular: Negative for chest pain, palpitations and leg swelling.  Gastrointestinal: Negative for abdominal distention, blood in stool, diarrhea and nausea.  Genitourinary: Negative for frequency and hematuria.  Musculoskeletal: Negative for back pain, gait problem, joint swelling and neck pain.  Skin: Negative for rash.  Neurological: Negative for dizziness, tremors, speech difficulty and weakness.  Psychiatric/Behavioral: Negative for agitation, dysphoric mood, sleep disturbance and suicidal ideas. The patient is not nervous/anxious.     Objective:  BP 130/62 (BP Location: Left Arm)   Pulse 70   Temp 98.6 F (37 C) (Oral)   SpO2 95%   BP Readings from Last 3 Encounters:  09/27/20 130/62  09/15/20 138/80  06/15/20  112/68    Wt Readings from Last 3 Encounters:  09/15/20 237 lb 9.6 oz (107.8 kg)  06/15/20 246 lb (111.6 kg)  03/02/20 236 lb (107 kg)    Physical Exam Constitutional:      General: He is not in acute distress.    Appearance: He is well-developed. He is obese.     Comments: NAD  HENT:     Mouth/Throat:     Mouth: Oropharynx is clear and moist.  Eyes:     Conjunctiva/sclera: Conjunctivae normal.     Pupils: Pupils are equal, round, and reactive to light.  Neck:     Thyroid: No thyromegaly.     Vascular: No JVD.  Cardiovascular:     Rate and Rhythm: Normal rate and  regular rhythm.     Pulses: Intact distal pulses.     Heart sounds: Normal heart sounds. No murmur heard. No friction rub. No gallop.   Pulmonary:     Effort: Pulmonary effort is normal. No respiratory distress.     Breath sounds: Normal breath sounds. No wheezing or rales.  Chest:     Chest wall: No tenderness.  Abdominal:     General: Bowel sounds are normal. There is no distension.     Palpations: Abdomen is soft. There is no mass.     Tenderness: There is no abdominal tenderness. There is no guarding or rebound.  Musculoskeletal:        General: No tenderness or edema. Normal range of motion.     Cervical back: Normal range of motion.  Lymphadenopathy:     Cervical: No cervical adenopathy.  Skin:    General: Skin is warm and dry.     Findings: No rash.  Neurological:     Mental Status: He is alert and oriented to person, place, and time.     Cranial Nerves: No cranial nerve deficit.     Motor: No abnormal muscle tone.     Coordination: He displays a negative Romberg sign. Coordination normal.     Gait: Gait normal.     Deep Tendon Reflexes: Reflexes are normal and symmetric.  Psychiatric:        Mood and Affect: Mood and affect normal.        Behavior: Behavior normal.        Thought Content: Thought content normal.        Judgment: Judgment normal.   fluid behind B TMs R>L, no wax B  Lab Results  Component Value Date   WBC 9.4 06/16/2020   HGB 13.1 06/16/2020   HCT 39.4 06/16/2020   PLT 240.0 06/16/2020   GLUCOSE 135 (H) 09/15/2020   CHOL 125 06/16/2020   TRIG 99.0 06/16/2020   HDL 41.10 06/16/2020   LDLCALC 64 06/16/2020   ALT 14 09/15/2020   AST 18 09/15/2020   NA 140 09/15/2020   K 4.1 09/15/2020   CL 104 09/15/2020   CREATININE 1.26 09/15/2020   BUN 23 09/15/2020   CO2 32 09/15/2020   TSH 2.62 06/16/2020   PSA 0.00 Repeated and verified X2. (L) 12/26/2012   INR 1.0 11/19/2018   HGBA1C 7.1 (H) 09/15/2020    MR Lumbar Spine Wo Contrast  Result  Date: 09/28/2019 CLINICAL DATA:  80 year old male with low back pain radiating down the left leg since July. Symptoms after lawnmower accident. EXAM: MRI LUMBAR SPINE WITHOUT CONTRAST TECHNIQUE: Multiplanar, multisequence MR imaging of the lumbar spine was performed. No intravenous contrast was administered. COMPARISON:  Lumbar  MRI 04/04/2016. Abdominal radiograph 11/20/2018. FINDINGS: Segmentation: Transitional anatomy. For the purposes of this report the same numbering system will be used as on the 2017 radiographs which designates a partially lumbarized S1 level with full size S1-S2 disc space. By this numbering system there are hypoplastic ribs at L1 (series 13 image 1). Correlation with radiographs is recommended prior to any operative intervention. Alignment: Stable lumbar lordosis since 2017. Mild retrolisthesis of L2 on L3, L3 on L4, L5 on S1. Vertebrae: No marrow edema or evidence of acute osseous abnormality. Visualized bone marrow signal is within normal limits. Intact visible sacrum and SI joints. Conus medullaris and cauda equina: Conus extends to the T12-L1 level, minimally visible and negative. Paraspinal and other soft tissues: Negative. Disc levels: T12-L1:  Negative. L1-L2: Mild posterior disc bulge or protrusion is stable without stenosis. Mild left facet hypertrophy. L2-L3: Chronic circumferential disc bulge with broad-based posterior and foraminal components. Mild facet hypertrophy. Mild disc progression since 2017 along with new mild spinal stenosis. No convincing lateral recess or foraminal stenosis. L3-L4: Disc desiccation with circumferential disc bulge. Chronic posterior annular fissure. Moderate posterior element hypertrophy and mild epidural lipomatosis. This level has progressed since 2017 with moderate spinal stenosis. Mild left lateral recess stenosis. Moderate left and mild right L3 foraminal stenosis. L4-L5: Mild circumferential disc bulge and moderate posterior element hypertrophy  appears stable. No significant spinal or lateral recess stenosis. There is chronic moderate to severe left L4 foraminal stenosis on series 5, image 11. L5-S1: Circumferential disc bulge with mild posterior element hypertrophy. No spinal or lateral recess stenosis. Mild to moderate left and mild right L5 foraminal stenosis is stable. S1-S2: Full size disc space. Mostly sacralized. Probable left side assimilation joint. No stenosis. IMPRESSION: 1. 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 Correlation with radiographs is recommended prior to any operative intervention. 2. 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 Moderate multifactorial spinal stenosis at the latter with mild left lateral recess and moderate left foraminal stenosis. Query left L3 and/or L4 radiculitis. 3. Superimposed chronic moderate to severe left L4 foraminal stenosis also at L4-L5. Up to moderate chronic left L5 foraminal stenosis. Electronically Signed   By: Genevie Ann M.D.   On: 09/28/2019 07:29    Assessment & Plan:   Gerard was seen today for hearing loss.  Diagnoses and all orders for this visit:  Bilateral hearing loss, unspecified hearing loss type  Bilateral otitis media, unspecified otitis media type  Other orders -     hydrALAZINE (APRESOLINE) 25 MG tablet; Take 1 tablet (25 mg total) by mouth 3 (three) times daily. -     cefdinir (OMNICEF) 300 MG capsule; Take 1 capsule (300 mg total) by mouth 2 (two) times daily. -     loratadine (CLARITIN) 10 MG tablet; Take 1 tablet (10 mg total) by mouth daily. Use for 1 month then prn -     fluticasone (FLONASE) 50 MCG/ACT nasal spray; Place 2 sprays into both nostrils daily. Use for 1 month then prn -     oxymetazoline (AFRIN NASAL SPRAY) 0.05 % nasal spray; Place 1 spray into both nostrils 2 (two) times daily. Use for 1 wek then stop     Meds ordered this encounter  Medications  . hydrALAZINE  (APRESOLINE) 25 MG tablet    Sig: Take 1 tablet (25 mg total) by mouth 3 (three) times daily.    Dispense:  270 tablet  Refill:  1  . cefdinir (OMNICEF) 300 MG capsule    Sig: Take 1 capsule (300 mg total) by mouth 2 (two) times daily.    Dispense:  20 capsule    Refill:  0  . loratadine (CLARITIN) 10 MG tablet    Sig: Take 1 tablet (10 mg total) by mouth daily. Use for 1 month then prn    Dispense:  100 tablet    Refill:  3  . fluticasone (FLONASE) 50 MCG/ACT nasal spray    Sig: Place 2 sprays into both nostrils daily. Use for 1 month then prn    Dispense:  16 g    Refill:  6  . oxymetazoline (AFRIN NASAL SPRAY) 0.05 % nasal spray    Sig: Place 1 spray into both nostrils 2 (two) times daily. Use for 1 wek then stop    Dispense:  30 mL    Refill:  0     Follow-up: No follow-ups on file.  Walker Kehr, MD

## 2020-10-24 ENCOUNTER — Telehealth: Payer: Self-pay | Admitting: Internal Medicine

## 2020-10-24 DIAGNOSIS — H9203 Otalgia, bilateral: Secondary | ICD-10-CM

## 2020-10-24 DIAGNOSIS — H9193 Unspecified hearing loss, bilateral: Secondary | ICD-10-CM

## 2020-10-24 NOTE — Telephone Encounter (Signed)
Patient called and is requesting a referral for water in the back of his ears. He can be reached at [phone]. Please advise

## 2020-10-26 DIAGNOSIS — L821 Other seborrheic keratosis: Secondary | ICD-10-CM | POA: Diagnosis not present

## 2020-10-26 DIAGNOSIS — L570 Actinic keratosis: Secondary | ICD-10-CM | POA: Diagnosis not present

## 2020-10-26 DIAGNOSIS — Z85828 Personal history of other malignant neoplasm of skin: Secondary | ICD-10-CM | POA: Diagnosis not present

## 2020-10-26 DIAGNOSIS — L905 Scar conditions and fibrosis of skin: Secondary | ICD-10-CM | POA: Diagnosis not present

## 2020-10-26 DIAGNOSIS — D1801 Hemangioma of skin and subcutaneous tissue: Secondary | ICD-10-CM | POA: Diagnosis not present

## 2020-10-26 DIAGNOSIS — L578 Other skin changes due to chronic exposure to nonionizing radiation: Secondary | ICD-10-CM | POA: Diagnosis not present

## 2020-10-26 DIAGNOSIS — Z859 Personal history of malignant neoplasm, unspecified: Secondary | ICD-10-CM | POA: Diagnosis not present

## 2020-10-27 DIAGNOSIS — H40013 Open angle with borderline findings, low risk, bilateral: Secondary | ICD-10-CM | POA: Diagnosis not present

## 2020-10-27 NOTE — Telephone Encounter (Signed)
Notified pt w/MD response.../lmb 

## 2020-10-27 NOTE — Telephone Encounter (Signed)
OK ENT Thx

## 2020-11-03 DIAGNOSIS — L255 Unspecified contact dermatitis due to plants, except food: Secondary | ICD-10-CM | POA: Diagnosis not present

## 2020-11-07 DIAGNOSIS — L237 Allergic contact dermatitis due to plants, except food: Secondary | ICD-10-CM | POA: Diagnosis not present

## 2020-12-02 DIAGNOSIS — H6991 Unspecified Eustachian tube disorder, right ear: Secondary | ICD-10-CM | POA: Insufficient documentation

## 2020-12-02 DIAGNOSIS — H6981 Other specified disorders of Eustachian tube, right ear: Secondary | ICD-10-CM | POA: Diagnosis not present

## 2020-12-02 DIAGNOSIS — H903 Sensorineural hearing loss, bilateral: Secondary | ICD-10-CM | POA: Insufficient documentation

## 2020-12-05 ENCOUNTER — Other Ambulatory Visit: Payer: Self-pay | Admitting: Internal Medicine

## 2020-12-23 ENCOUNTER — Encounter: Payer: Self-pay | Admitting: Gastroenterology

## 2021-01-01 ENCOUNTER — Other Ambulatory Visit: Payer: Self-pay | Admitting: Internal Medicine

## 2021-01-12 ENCOUNTER — Ambulatory Visit: Payer: Medicare Other | Admitting: Internal Medicine

## 2021-01-14 ENCOUNTER — Other Ambulatory Visit: Payer: Self-pay | Admitting: Internal Medicine

## 2021-01-20 ENCOUNTER — Other Ambulatory Visit: Payer: Self-pay

## 2021-01-20 ENCOUNTER — Encounter: Payer: Self-pay | Admitting: Internal Medicine

## 2021-01-20 ENCOUNTER — Ambulatory Visit (INDEPENDENT_AMBULATORY_CARE_PROVIDER_SITE_OTHER): Payer: Medicare Other | Admitting: Internal Medicine

## 2021-01-20 VITALS — BP 124/68 | HR 69 | Temp 98.2°F | Ht 72.0 in | Wt 248.0 lb

## 2021-01-20 DIAGNOSIS — N183 Chronic kidney disease, stage 3 unspecified: Secondary | ICD-10-CM | POA: Diagnosis not present

## 2021-01-20 DIAGNOSIS — R0609 Other forms of dyspnea: Secondary | ICD-10-CM

## 2021-01-20 DIAGNOSIS — E538 Deficiency of other specified B group vitamins: Secondary | ICD-10-CM

## 2021-01-20 DIAGNOSIS — E1159 Type 2 diabetes mellitus with other circulatory complications: Secondary | ICD-10-CM

## 2021-01-20 DIAGNOSIS — I10 Essential (primary) hypertension: Secondary | ICD-10-CM | POA: Diagnosis not present

## 2021-01-20 DIAGNOSIS — R0600 Dyspnea, unspecified: Secondary | ICD-10-CM

## 2021-01-20 DIAGNOSIS — Z8546 Personal history of malignant neoplasm of prostate: Secondary | ICD-10-CM

## 2021-01-20 DIAGNOSIS — I5042 Chronic combined systolic (congestive) and diastolic (congestive) heart failure: Secondary | ICD-10-CM

## 2021-01-20 LAB — HEMOGLOBIN A1C: Hgb A1c MFr Bld: 7.7 % — ABNORMAL HIGH (ref 4.6–6.5)

## 2021-01-20 LAB — T4, FREE: Free T4: 0.81 ng/dL (ref 0.60–1.60)

## 2021-01-20 LAB — VITAMIN B12: Vitamin B-12: 389 pg/mL (ref 211–911)

## 2021-01-20 LAB — TSH: TSH: 2.28 u[IU]/mL (ref 0.35–4.50)

## 2021-01-20 MED ORDER — CARVEDILOL 25 MG PO TABS: 25.0000 mg | ORAL_TABLET | Freq: Two times a day (BID) | ORAL | 3 refills | Status: DC

## 2021-01-20 MED ORDER — FUROSEMIDE 40 MG PO TABS: ORAL_TABLET | ORAL | 3 refills | Status: DC

## 2021-01-20 NOTE — Assessment & Plan Note (Signed)
Chronic. 

## 2021-01-20 NOTE — Assessment & Plan Note (Signed)
Will monitor PSA 

## 2021-01-20 NOTE — Assessment & Plan Note (Addendum)
Coreg 25 mg twice a day and hydralazine 50 mg 3 times a day. On Furosemide

## 2021-01-20 NOTE — Assessment & Plan Note (Signed)
Larry [name] is too $$$

## 2021-01-20 NOTE — Assessment & Plan Note (Signed)
Cont w/Metformin, Glimepiride

## 2021-01-20 NOTE — Assessment & Plan Note (Signed)
Coreg to 25 mg twice a day and hydralazine 50 mg 3 times a day. On Furosemide

## 2021-01-20 NOTE — Progress Notes (Signed)
Subjective:  Patient ID: Larry [name], male    DOB: 09/19/1940  Age: 80 y.o. MRN: [id number]  CC: Follow-up (3 month check. Pt would like to discuss pneumonia and shingles vaccines)   HPI Larry [name] presents for fatigue - sleeping a lot. F/u DM, HTN C/o leg swelling - B 2+  Outpatient Medications Prior to Visit  Medication Sig Dispense Refill  . Accu-Chek FastClix Lancets MISC USE 1  TO CHECK GLUCOSE TWICE DAILY 100 each 11  . aspirin 325 MG tablet Take 1 tablet (325 mg total) by mouth daily. 60 tablet 0  . carvedilol (COREG) 12.5 MG tablet TAKE 1 TABLET BY MOUTH TWICE DAILY WITH MEALS 180 tablet 3  . cefdinir (OMNICEF) 300 MG capsule Take 1 capsule (300 mg total) by mouth 2 (two) times daily. 20 capsule 0  . Cholecalciferol (VITAMIN D3) 1000 UNITS CAPS Take 1,000 Units by mouth daily.    . Cyanocobalamin (VITAMIN B-12) 1000 MCG SUBL Place 1 tablet (1,000 mcg total) under the tongue daily. 532 tablet 3  . folic acid (FOLVITE) 1 MG tablet Take 1 tablet by mouth once daily 90 tablet 3  . furosemide (LASIX) 40 MG tablet Take 1 tablet by mouth once daily 90 tablet 3  . glucose blood (ACCU-CHEK SMARTVIEW) test strip USE 1 STRIP TO CHECK GLUCOSE TWICE DAILY E11.9 180 each 3  . glucose blood (ACCU-CHEK SMARTVIEW) test strip USE 1 STRIP TO CHECK GLUCOSE  DAILY 90 each 3  . hydrALAZINE (APRESOLINE) 25 MG tablet Take 1 tablet (25 mg total) by mouth 3 (three) times daily. 270 tablet 1  . hydrocortisone 2.5 % cream Apply topically 2 (two) times daily.    [name] LINZESS 290 MCG CAPS capsule TAKE 1 CAPSULE BY MOUTH ONCE DAILY AS NEEDED 90 capsule 3  . metFORMIN (GLUCOPHAGE) 1000 MG tablet TAKE 1 TABLET BY MOUTH TWICE DAILY WITH A MEAL. 180 tablet 3  . Multiple Vitamins-Minerals (MACULAR VITAMIN BENEFIT) TABS Take 1 tablet by mouth 2 (two) times daily.    . nitroGLYCERIN (NITROSTAT) 0.4 MG SL tablet Place 1 tablet (0.4 mg total) under the tongue every 5 (five) minutes as needed for chest pain. 20  tablet 3  . PREVIDENT 5000 DRY MOUTH 1.1 % GEL dental gel USE ONCE DAILY IN PLACE OF REGULAR TOOTHPASTE.    . repaglinide (PRANDIN) 1 MG tablet Take 1 tablet (1 mg total) by mouth 3 (three) times daily before meals. 270 tablet 1  . simvastatin (ZOCOR) 40 MG tablet Take 1 tablet (40 mg total) by mouth every evening. 90 tablet 3  . thiamine 100 MG tablet Take 1 tablet (100 mg total) by mouth daily. 90 tablet 3  . fluticasone (FLONASE) 50 MCG/ACT nasal spray Place 2 sprays into both nostrils daily. Use for 1 month then prn (Patient not taking: Reported on 01/20/2021) 16 g 6  . loratadine (CLARITIN) 10 MG tablet Take 1 tablet (10 mg total) by mouth daily. Use for 1 month then prn (Patient not taking: Reported on 01/20/2021) 100 tablet 3  . oxymetazoline (AFRIN NASAL SPRAY) 0.05 % nasal spray Place 1 spray into both nostrils 2 (two) times daily. Use for 1 wek then stop (Patient not taking: Reported on 01/20/2021) 30 mL 0   No facility-administered medications prior to visit.    ROS: Review of Systems  Constitutional: Positive for fatigue. Negative for appetite change and unexpected weight change.  HENT: Negative for congestion, nosebleeds, sneezing, sore throat and trouble swallowing.  Eyes: Negative for itching and visual disturbance.  Respiratory: Negative for cough.   Cardiovascular: Positive for leg swelling. Negative for chest pain and palpitations.  Gastrointestinal: Negative for abdominal distention, blood in stool, diarrhea and nausea.  Genitourinary: Negative for frequency and hematuria.  Musculoskeletal: Negative for back pain, gait problem, joint swelling and neck pain.  Skin: Negative for rash.  Neurological: Negative for dizziness, tremors, speech difficulty and weakness.  Psychiatric/Behavioral: Negative for agitation, dysphoric mood and sleep disturbance. The patient is not nervous/anxious.     Objective:  BP 124/68   Pulse 69   Temp 98.2 F (36.8 C) (Oral)   Ht 6' (1.829 m)    Wt 248 lb (112.5 kg)   SpO2 95%   BMI 33.63 kg/m   BP Readings from Last 3 Encounters:  01/20/21 124/68  09/27/20 130/62  09/15/20 138/80    Wt Readings from Last 3 Encounters:  01/20/21 248 lb (112.5 kg)  09/15/20 237 lb 9.6 oz (107.8 kg)  06/15/20 246 lb (111.6 kg)    Physical Exam Constitutional:      General: He is not in acute distress.    Appearance: He is well-developed. He is obese.     Comments: NAD  Eyes:     Conjunctiva/sclera: Conjunctivae normal.     Pupils: Pupils are equal, round, and reactive to light.  Neck:     Thyroid: No thyromegaly.     Vascular: No JVD.  Cardiovascular:     Rate and Rhythm: Normal rate and regular rhythm.     Heart sounds: Normal heart sounds. No murmur heard. No friction rub. No gallop.   Pulmonary:     Effort: Pulmonary effort is normal. No respiratory distress.     Breath sounds: Normal breath sounds. No wheezing or rales.  Chest:     Chest wall: No tenderness.  Abdominal:     General: Bowel sounds are normal. There is no distension.     Palpations: Abdomen is soft. There is no mass.     Tenderness: There is no abdominal tenderness. There is no guarding or rebound.  Musculoskeletal:        General: No tenderness. Normal range of motion.     Cervical back: Normal range of motion.     Right lower leg: Edema present.     Left lower leg: Edema present.  Lymphadenopathy:     Cervical: No cervical adenopathy.  Skin:    General: Skin is warm and dry.     Findings: No rash.  Neurological:     Mental Status: He is alert and oriented to person, place, and time.     Cranial Nerves: No cranial nerve deficit.     Motor: No abnormal muscle tone.     Coordination: Coordination normal.     Gait: Gait normal.     Deep Tendon Reflexes: Reflexes are normal and symmetric.  Psychiatric:        Behavior: Behavior normal.        Thought Content: Thought content normal.        Judgment: Judgment normal.   2+ B edema  Lab Results   Component Value Date   WBC 9.4 06/16/2020   HGB 13.1 06/16/2020   HCT 39.4 06/16/2020   PLT 240.0 06/16/2020   GLUCOSE 135 (H) 09/15/2020   CHOL 125 06/16/2020   TRIG 99.0 06/16/2020   HDL 41.10 06/16/2020   LDLCALC 64 06/16/2020   ALT 14 09/15/2020   AST 18 09/15/2020  NA 140 09/15/2020   K 4.1 09/15/2020   CL 104 09/15/2020   CREATININE 1.26 09/15/2020   BUN 23 09/15/2020   CO2 32 09/15/2020   TSH 2.62 06/16/2020   PSA 0.00 Repeated and verified X2. (L) 12/26/2012   INR 1.0 11/19/2018   HGBA1C 7.1 (H) 09/15/2020    MR Lumbar Spine Wo Contrast  Result Date: 09/28/2019 CLINICAL DATA:  80 year old male with low back pain radiating down the left leg since July. Symptoms after lawnmower accident. EXAM: MRI LUMBAR SPINE WITHOUT CONTRAST TECHNIQUE: Multiplanar, multisequence MR imaging of the lumbar spine was performed. No intravenous contrast was administered. COMPARISON:  Lumbar MRI 04/04/2016. Abdominal radiograph 11/20/2018. FINDINGS: Segmentation: Transitional anatomy. For the purposes of this report the same numbering system will be used as on the 2017 radiographs which designates a partially lumbarized S1 level with full size S1-S2 disc space. By this numbering system there are hypoplastic ribs at L1 (series 13 image 1). Correlation with radiographs is recommended prior to any operative intervention. Alignment: Stable lumbar lordosis since 2017. Mild retrolisthesis of L2 on L3, L3 on L4, L5 on S1. Vertebrae: No marrow edema or evidence of acute osseous abnormality. Visualized bone marrow signal is within normal limits. Intact visible sacrum and SI joints. Conus medullaris and cauda equina: Conus extends to the T12-L1 level, minimally visible and negative. Paraspinal and other soft tissues: Negative. Disc levels: T12-L1:  Negative. L1-L2: Mild posterior disc bulge or protrusion is stable without stenosis. Mild left facet hypertrophy. L2-L3: Chronic circumferential disc bulge with  broad-based posterior and foraminal components. Mild facet hypertrophy. Mild disc progression since 2017 along with new mild spinal stenosis. No convincing lateral recess or foraminal stenosis. L3-L4: Disc desiccation with circumferential disc bulge. Chronic posterior annular fissure. Moderate posterior element hypertrophy and mild epidural lipomatosis. This level has progressed since 2017 with moderate spinal stenosis. Mild left lateral recess stenosis. Moderate left and mild right L3 foraminal stenosis. L4-L5: Mild circumferential disc bulge and moderate posterior element hypertrophy appears stable. No significant spinal or lateral recess stenosis. There is chronic moderate to severe left L4 foraminal stenosis on series 5, image 11. L5-S1: Circumferential disc bulge with mild posterior element hypertrophy. No spinal or lateral recess stenosis. Mild to moderate left and mild right L5 foraminal stenosis is stable. S1-S2: Full size disc space. Mostly sacralized. Probable left side assimilation joint. No stenosis. IMPRESSION: 1. 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 Correlation with radiographs is recommended prior to any operative intervention. 2. 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 Moderate multifactorial spinal stenosis at the latter with mild left lateral recess and moderate left foraminal stenosis. Query left L3 and/or L4 radiculitis. 3. Superimposed chronic moderate to severe left L4 foraminal stenosis also at L4-L5. Up to moderate chronic left L5 foraminal stenosis. Electronically Signed   By: Genevie Ann M.D.   On: 09/28/2019 07:29    Assessment & Plan:    Walker Kehr, MD

## 2021-01-22 NOTE — Progress Notes (Signed)
Cardiology Office Note:    Date:  01/23/2021   ID:  Larry [name], DOB 1940/09/24, MRN [id number]  PCP:  Larry Anger, MD  Cardiologist:  Larry Grooms, MD   Referring MD: Larry Anger, MD   Chief Complaint  Patient presents with  . Coronary Artery Disease  . Congestive Heart Failure    History of Present Illness:    Larry [name] is a 80 y.o. male with a hx of CAD, coronary bypass grafting 2018 (LIMA to LAD, SVG to Ramus Intermediate, SVG to OM, and SVG to RCA), obesity, type 2 diabetes, chronic combined systolic and diastolic heart failure (most recent hospital stay August 25, 2017,carotid artery disease status post left carotid endarterectomy January 2019 and severe obesity.LVEF 45%-> 60%.   Larry [name] is doing well and angina.  He is noticing progressive lower extremity swelling.  He feels tired most of the time.  Easy to fall asleep during the day.  He denies angina.  He does wear a lower extremity support stockings.  Did not wear them today so I can see his legs.  Past Medical History:  Diagnosis Date  . Carotid stenosis, left   . Cataracts, bilateral    removed  . Coronary artery disease   . Hyperlipemia   . Prostate CA (Larry [name])   . Sleep apnea    Dr Larry [name] ; uses CPAP  . Stroke ([place])   . Type II or unspecified type diabetes mellitus without mention of complication, not stated as uncontrolled   . Unspecified essential hypertension     Past Surgical History:  Procedure Laterality Date  . APPENDECTOMY    . CARDIAC CATHETERIZATION    . CATARACT EXTRACTION, BILATERAL    . CORONARY ARTERY BYPASS GRAFT N/A 06/27/2017   Procedure: CORONARY ARTERY BYPASS GRAFTING (CABG), time four   using the left internal mammary artery, and right saphenous leg vein harvested endoscopically.;  Surgeon: Larry Poot, MD;  Location: [facility];  Service: Open Heart Surgery;  Laterality: N/A;  . CYSTOSCOPY N/A 06/27/2017   Procedure: CYSTOSCOPY FLEXIBLE, Balloon dilatation  placement of foley catheter;  Surgeon: Larry Poot, MD;  Location: [facility];  Service: Open Heart Surgery;  Laterality: N/A;  . ENDARTERECTOMY Left 08/22/2017   Procedure: ENDARTERECTOMY CAROTID LEFT;  Surgeon: Larry Mitchell, MD;  Location: [facility];  Service: Vascular;  Laterality: Left;  . INTRAVASCULAR ULTRASOUND/IVUS N/A 06/24/2017   Procedure: Intravascular Ultrasound/IVUS;  Surgeon: Larry Crome, MD;  Location: [facility];  Service: Cardiovascular;  Laterality: N/A;  LEFT MAIN  . LEFT HEART CATH AND CORONARY ANGIOGRAPHY N/A 06/24/2017   Procedure: LEFT HEART CATH AND CORONARY ANGIOGRAPHY;  Surgeon: Larry Crome, MD;  Location: [facility];  Service: Cardiovascular;  Laterality: N/A;  . PATCH ANGIOPLASTY Left 08/22/2017   Procedure: PATCH ANGIOPLASTY USING Larry [name] BIOLOGIC PATCH;  Surgeon: Larry Mitchell, MD;  Location: [facility];  Service: Vascular;  Laterality: Left;  . PROSTATECTOMY    . TEE WITHOUT CARDIOVERSION N/A 06/27/2017   Procedure: TRANSESOPHAGEAL ECHOCARDIOGRAM (TEE);  Surgeon: Larry [name], Larry Salina, MD;  Location: [facility];  Service: Open Heart Surgery;  Laterality: N/A;  . TONSILLECTOMY    . ULTRASOUND GUIDANCE FOR VASCULAR ACCESS  06/24/2017   Procedure: Ultrasound Guidance For Vascular Access;  Surgeon: Larry Crome, MD;  Location: [facility];  Service: Cardiovascular;;    Current Medications: Current Meds  Medication Sig  . Accu-Chek FastClix Lancets MISC USE  1  TO CHECK GLUCOSE TWICE DAILY  . aspirin 325 MG tablet Take 1 tablet (325 mg total) by mouth daily.  . carvedilol (COREG) 25 MG tablet Take 1 tablet (25 mg total) by mouth 2 (two) times daily.  . Cholecalciferol (VITAMIN D3) 1000 UNITS CAPS Take 1,000 Units by mouth daily.  . Cyanocobalamin (VITAMIN B-12) 1000 MCG SUBL Place 1 tablet (1,000 mcg total) under the tongue daily.  . dapagliflozin propanediol (FARXIGA) 10 MG TABS tablet Take 1 tablet (10 mg total) by mouth daily before breakfast.  .  fluticasone (FLONASE) 50 MCG/ACT nasal spray Place 2 sprays into both nostrils daily. Use for 1 month then prn  . folic acid (FOLVITE) 1 MG tablet Take 1 tablet by mouth once daily  . furosemide (LASIX) 40 MG tablet Take one q am and one at hs prn swelling  . glucose blood (ACCU-CHEK SMARTVIEW) test strip USE 1 STRIP TO CHECK GLUCOSE TWICE DAILY E11.9  . glucose blood (ACCU-CHEK SMARTVIEW) test strip USE 1 STRIP TO CHECK GLUCOSE  DAILY  . hydrALAZINE (APRESOLINE) 25 MG tablet Take 1 tablet (25 mg total) by mouth 3 (three) times daily.  . hydrocortisone 2.5 % cream Apply topically 2 (two) times daily.  [name] LINZESS 290 MCG CAPS capsule TAKE 1 CAPSULE BY MOUTH ONCE DAILY AS NEEDED  . loratadine (CLARITIN) 10 MG tablet Take 1 tablet (10 mg total) by mouth daily. Use for 1 month then prn  . metFORMIN (GLUCOPHAGE) 1000 MG tablet TAKE 1 TABLET BY MOUTH TWICE DAILY WITH A MEAL.  . Multiple Vitamins-Minerals (MACULAR VITAMIN BENEFIT) TABS Take 1 tablet by mouth 2 (two) times daily.  . nitroGLYCERIN (NITROSTAT) 0.4 MG SL tablet Place 1 tablet (0.4 mg total) under the tongue every 5 (five) minutes as needed for chest pain.  [name] oxymetazoline (AFRIN NASAL SPRAY) 0.05 % nasal spray Place 1 spray into both nostrils 2 (two) times daily. Use for 1 wek then stop  . PREVIDENT 5000 DRY MOUTH 1.1 % GEL dental gel USE ONCE DAILY IN PLACE OF REGULAR TOOTHPASTE.  . repaglinide (PRANDIN) 1 MG tablet Take 1 tablet (1 mg total) by mouth 3 (three) times daily before meals.  . simvastatin (ZOCOR) 40 MG tablet Take 1 tablet (40 mg total) by mouth every evening.  . thiamine 100 MG tablet Take 1 tablet (100 mg total) by mouth daily.     Allergies:   Amlodipine, Verapamil, Clindamycin hcl, and Contrast media [iodinated diagnostic agents]   Social History   Socioeconomic History  . Marital status: Married    Spouse name: Not on file  . Number of children: Not on file  . Years of education: Not on file  . Highest education  level: Bachelor's degree (e.g., BA, AB, BS)  Occupational History  . Occupation: Salesman  Tobacco Use  . Smoking status: Never Smoker  . Smokeless tobacco: Never Used  Vaping Use  . Vaping Use: Never used  Substance and Sexual Activity  . Alcohol use: No    Alcohol/week: 0.0 standard drinks  . Drug use: No  . Sexual activity: Not on file  Other Topics Concern  . Not on file  Social History Narrative   Regular Exercise- no, golf   Social Determinants of Health   Financial Resource Strain: Not on file  Food Insecurity: Not on file  Transportation Needs: Not on file  Physical Activity: Not on file  Stress: Not on file  Social Connections: Not on file     Family  History: The patient's family history includes Diabetes in an other family member; Heart disease in his mother; Hypertension in an other family member. There is no history of Colon cancer.  ROS:   Please see the history of present illness.    Compliant with his medications.  Denies noncompliance with sleep therapy.  Compliant with medications.  Recently had laboratory work done by Dr. Alain Marion. All other systems reviewed and are negative.  EKGs/Labs/Other Studies Reviewed:    The following studies were reviewed today: ECHOCARDIOGRAM 2021: IMPRESSIONS    1. Left ventricular ejection fraction, by estimation, is 60 to 65%. The  left ventricle has normal function. The left ventricle has no regional  wall motion abnormalities. The left ventricular internal cavity size was  moderately dilated. Left ventricular  diastolic parameters are indeterminate. Elevated left atrial pressure.  2. Right ventricular systolic function is normal. The right ventricular  size is normal. There is normal pulmonary artery systolic pressure.  3. The mitral valve is normal in structure. Mild mitral valve  regurgitation. No evidence of mitral stenosis.  4. The aortic valve is tricuspid. Aortic valve regurgitation is not  visualized.  No aortic stenosis is present.  5. The inferior vena cava is normal in size with greater than 50%  respiratory variability, suggesting right atrial pressure of 3 mmHg.   EKG:  EKG sinus rhythm, right bundle, first-degree AV block, occasional PVC.  Recent Labs: 06/16/2020: Hemoglobin 13.1; Platelets 240.0; TSH 2.62 09/15/2020: ALT 14; BUN 23; Creatinine, Ser 1.26; Potassium 4.1; Sodium 140  Recent Lipid Panel    Component Value Date/Time   CHOL 125 06/16/2020 1019   CHOL 137 09/19/2018 0743   TRIG 99.0 06/16/2020 1019   HDL 41.10 06/16/2020 1019   HDL 39 (L) 09/19/2018 0743   CHOLHDL 3 06/16/2020 1019   VLDL 19.8 06/16/2020 1019   LDLCALC 64 06/16/2020 1019   LDLCALC 67 09/19/2018 0743    Physical Exam:    VS:  BP 132/60 (BP Location: Left Arm, Patient Position: Sitting, Cuff Size: Normal)   Pulse 66   Ht 6' (1.829 m)   Wt 246 lb (111.6 kg)   SpO2 96%   BMI 33.36 kg/m     Wt Readings from Last 3 Encounters:  01/23/21 246 lb (111.6 kg)  01/20/21 248 lb (112.5 kg)  09/15/20 237 lb 9.6 oz (107.8 kg)     GEN: Obese with wide neck.. No acute distress HEENT: Normal NECK: Moderate JVD with patient lying at 40 degrees. LYMPHATICS: No lymphadenopathy CARDIAC: No murmur. RRR S4 gallop 2-3+ ankle to mid shin bilateral edema. VASCULAR:  Normal Pulses. No bruits. RESPIRATORY:  Clear to auscultation without rales, wheezing or rhonchi  ABDOMEN: Soft, non-tender, non-distended, No pulsatile mass, MUSCULOSKELETAL: No deformity  SKIN: Warm and dry NEUROLOGIC:  Alert and oriented x 3 PSYCHIATRIC:  Normal affect   ASSESSMENT:    1. Chronic combined systolic and diastolic heart failure ([place])   2. Coronary artery disease involving coronary bypass graft of native heart with angina pectoris ([place])   3. Essential hypertension   4. Type 2 diabetes mellitus with other circulatory complication, without long-term current use of insulin ([place])   5. Dyslipidemia   6. Chronic venous  insufficiency   7. Obstructive hypertrophic cardiomyopathy ([facility])    PLAN:    In order of problems listed above:  1. EF is improved.  Has diastolic dysfunction.  With risk factors that include diabetes, CKD, sleep apnea, SGLT2 therapy would be helpful. 2. Discussed  secondary risk prevention.  See below. 3. Target blood pressure 130/80 mmHg that will increase to 140/80 mmHg in 1 year.  Continue ARB, Apresoline, furosemide, carvedilol,.  With the addition of Farxiga 10 mg/day, may be able to discontinue Apresoline and possibly decrease the intensity of diuretic regimen. 4. Start Farxiga 10 mg/day.  Basic metabolic panel in 2 weeks. 5. Continue atorvastatin 40 mg/day. 6. Continue to wear lower extremity compression stockings. 7. Reassess sleep apnea equipment and update as required as he currently has symptoms that suggest poor sleep.  Overall education and awareness concerning primary/secondary risk prevention was discussed in detail: LDL less than 70, hemoglobin A1c less than 7, blood pressure target less than 130/80 mmHg, >150 minutes of moderate aerobic activity per week, avoidance of smoking, weight control (via diet and exercise), and continued surveillance/management of/for obstructive sleep apnea.  Established with Dr. Radford Pax for sleep reassessment and equipment calibration     Medication Adjustments/Labs and Tests Ordered: Current medicines are reviewed at length with the patient today.  Concerns regarding medicines are outlined above.  Orders Placed This Encounter  Procedures  . Basic metabolic panel  . EKG 12-Lead   Meds ordered this encounter  Medications  . dapagliflozin propanediol (FARXIGA) 10 MG TABS tablet    Sig: Take 1 tablet (10 mg total) by mouth daily before breakfast.    Dispense:  30 tablet    Refill:  11    Patient Instructions  Medication Instructions:  1) START Farxiga 10mg  once daily.  *If you need a refill on your cardiac medications before your next  appointment, please call your pharmacy*   Lab Work: BMET in 2 weeks  If you have labs (blood work) drawn today and your tests are completely normal, you will receive your results only by: [name] MyChart Message (if you have MyChart) OR . A paper copy in the mail If you have any lab test that is abnormal or we need to change your treatment, we will call you to review the results.   Testing/Procedures: None   Follow-Up: At [facility], you and your health needs are our priority.  As part of our continuing mission to provide you with exceptional heart care, we have created designated Provider Care Teams.  These Care Teams include your primary Cardiologist (physician) and Advanced Practice Providers (APPs -  Physician Assistants and Nurse Practitioners) who all work together to provide you with the care you need, when you need it.  We recommend signing up for the patient portal called "MyChart".  Sign up information is provided on this After Visit Summary.  MyChart is used to connect with patients for Virtual Visits (Telemedicine).  Patients are able to view lab/test results, encounter notes, upcoming appointments, etc.  Non-urgent messages can be sent to your provider as well.   To learn more about what you can do with MyChart, go to [email or web address].    Your next appointment:   6-8 week(s)  The format for your next appointment:   In Person  Provider:   You may see Larry Grooms, MD or one of the following Advanced Practice Providers on your designated Care Team:    Kathyrn Drown, NP    Other Instructions      Signed, Larry Grooms, MD  01/23/2021 9:50 AM    [place]

## 2021-01-23 ENCOUNTER — Other Ambulatory Visit: Payer: Self-pay

## 2021-01-23 ENCOUNTER — Ambulatory Visit (INDEPENDENT_AMBULATORY_CARE_PROVIDER_SITE_OTHER): Payer: Medicare Other | Admitting: Interventional Cardiology

## 2021-01-23 ENCOUNTER — Encounter: Payer: Self-pay | Admitting: Interventional Cardiology

## 2021-01-23 VITALS — BP 132/60 | HR 66 | Ht 72.0 in | Wt 246.0 lb

## 2021-01-23 DIAGNOSIS — E785 Hyperlipidemia, unspecified: Secondary | ICD-10-CM | POA: Diagnosis not present

## 2021-01-23 DIAGNOSIS — E1159 Type 2 diabetes mellitus with other circulatory complications: Secondary | ICD-10-CM

## 2021-01-23 DIAGNOSIS — I5042 Chronic combined systolic (congestive) and diastolic (congestive) heart failure: Secondary | ICD-10-CM | POA: Diagnosis not present

## 2021-01-23 DIAGNOSIS — I25709 Atherosclerosis of coronary artery bypass graft(s), unspecified, with unspecified angina pectoris: Secondary | ICD-10-CM

## 2021-01-23 DIAGNOSIS — I421 Obstructive hypertrophic cardiomyopathy: Secondary | ICD-10-CM | POA: Diagnosis not present

## 2021-01-23 DIAGNOSIS — I872 Venous insufficiency (chronic) (peripheral): Secondary | ICD-10-CM | POA: Diagnosis not present

## 2021-01-23 DIAGNOSIS — I10 Essential (primary) hypertension: Secondary | ICD-10-CM

## 2021-01-23 LAB — COMPREHENSIVE METABOLIC PANEL
ALT: 12 U/L (ref 0–53)
AST: 24 U/L (ref 0–37)
Albumin: 4 g/dL (ref 3.5–5.2)
Alkaline Phosphatase: 65 U/L (ref 39–117)
BUN: 20 mg/dL (ref 6–23)
CO2: 27 mEq/L (ref 19–32)
Calcium: 9.9 mg/dL (ref 8.4–10.5)
Chloride: 104 mEq/L (ref 96–112)
Creatinine, Ser: 1.24 mg/dL (ref 0.40–1.50)
GFR: 55.42 mL/min — ABNORMAL LOW (ref >60.00)
Glucose, Bld: 143 mg/dL — ABNORMAL HIGH (ref 70–99)
Potassium: 4.7 mEq/L (ref 3.5–5.1)
Sodium: 140 mEq/L (ref 135–145)
Total Bilirubin: 0.5 mg/dL (ref 0.2–1.2)
Total Protein: 6.5 g/dL (ref 6.0–8.3)

## 2021-01-23 MED ORDER — DAPAGLIFLOZIN PROPANEDIOL 10 MG PO TABS: 10.0000 mg | ORAL_TABLET | Freq: Every day | ORAL | 11 refills | Status: DC

## 2021-01-23 NOTE — Patient Instructions (Signed)
Medication Instructions:  1) START Farxiga 10mg  once daily.  *If you need a refill on your cardiac medications before your next appointment, please call your pharmacy*   Lab Work: BMET in 2 weeks  If you have labs (blood work) drawn today and your tests are completely normal, you will receive your results only by: [name] MyChart Message (if you have MyChart) OR . A paper copy in the mail If you have any lab test that is abnormal or we need to change your treatment, we will call you to review the results.   Testing/Procedures: None   Follow-Up: At [facility], you and your health needs are our priority.  As part of our continuing mission to provide you with exceptional heart care, we have created designated Provider Care Teams.  These Care Teams include your primary Cardiologist (physician) and Advanced Practice Providers (APPs -  Physician Assistants and Nurse Practitioners) who all work together to provide you with the care you need, when you need it.  We recommend signing up for the patient portal called "MyChart".  Sign up information is provided on this After Visit Summary.  MyChart is used to connect with patients for Virtual Visits (Telemedicine).  Patients are able to view lab/test results, encounter notes, upcoming appointments, etc.  Non-urgent messages can be sent to your provider as well.   To learn more about what you can do with MyChart, go to [email or web address].    Your next appointment:   6-8 week(s)  The format for your next appointment:   In Person  Provider:   You may see Sinclair Grooms, MD or one of the following Advanced Practice Providers on your designated Care Team:    Kathyrn Drown, NP    Other Instructions

## 2021-02-07 ENCOUNTER — Other Ambulatory Visit: Payer: Medicare Other | Admitting: *Deleted

## 2021-02-07 ENCOUNTER — Other Ambulatory Visit: Payer: Self-pay

## 2021-02-07 DIAGNOSIS — I5042 Chronic combined systolic (congestive) and diastolic (congestive) heart failure: Secondary | ICD-10-CM

## 2021-02-07 LAB — BASIC METABOLIC PANEL
BUN/Creatinine Ratio: 21 (ref 10–24)
BUN: 29 mg/dL — ABNORMAL HIGH (ref 8–27)
CO2: 22 mmol/L (ref 20–29)
Calcium: 9.7 mg/dL (ref 8.6–10.2)
Chloride: 104 mmol/L (ref 96–106)
Creatinine, Ser: 1.39 mg/dL — ABNORMAL HIGH (ref 0.76–1.27)
Glucose: 216 mg/dL — ABNORMAL HIGH (ref 65–99)
Potassium: 4.6 mmol/L (ref 3.5–5.2)
Sodium: 141 mmol/L (ref 134–144)
eGFR: 52 mL/min/{1.73_m2} — ABNORMAL LOW (ref >59)

## 2021-02-08 ENCOUNTER — Telehealth: Payer: Self-pay | Admitting: *Deleted

## 2021-02-08 NOTE — Telephone Encounter (Signed)
I spoke with patient.  He reports he was given samples of Farxiga and used free card yesterday.  He checked and going forward Wilder Glade will cost $480/month.  He would like assistance with this if possible.  I gave patient phone number for [place] and [place] and explained assistance process to patient.  He will return paperwork to office once completed.  Patient made aware office can provide samples if needed while waiting for decision from [place] and me

## 2021-02-08 NOTE — Telephone Encounter (Signed)
Patient reports he called assistance number and was given approval over the phone for assistance program.  Needs prescription faxed to [phone]

## 2021-02-09 ENCOUNTER — Other Ambulatory Visit: Payer: Self-pay

## 2021-02-09 MED ORDER — DAPAGLIFLOZIN PROPANEDIOL 10 MG PO TABS: 10.0000 mg | ORAL_TABLET | Freq: Every day | ORAL | 3 refills | Status: DC

## 2021-02-13 NOTE — Telephone Encounter (Signed)
**Note De-Identified Maygan Koeller Obfuscation** We received an approval letter on 6/24 stating that the pt has been approved for asst with Farxiga until 08/19/2021.  I e-scribed the pts [place] RX to [facility] (pharmacy for [place] and [place]) on Friday 6/24.  We received a second letter on 6/24 stating that they are shipping the pts [place] within 10 business days and that they have notified the pt of this as well.

## 2021-03-06 ENCOUNTER — Other Ambulatory Visit: Payer: Self-pay | Admitting: Internal Medicine

## 2021-03-07 NOTE — Progress Notes (Signed)
Cardiology Office Note:    Date:  03/08/2021   ID:  Larry [name], DOB [birth year], MRN [id number]  PCP:  Cassandria Anger, MD  Cardiologist:  Sinclair Grooms, MD   Referring MD: Cassandria Anger, MD   No chief complaint on file.   History of Present Illness:    Larry [name] is a 80 y.o. male with a hx of CAD, coronary bypass grafting 2018 (LIMA to LAD, SVG to Ramus Intermediate, SVG to OM, and SVG to RCA), obesity, type 2 diabetes, chronic combined systolic and diastolic heart failure (most recent hospital stay August 25, 2017, carotid artery disease status post left carotid endarterectomy January 2019 and severe obesity.  LVEF 45%-> 60%.   Larry [name] is doing well.  He has tolerated [place] without difficulty.  Lower extremity edema has not cleared.  He denies orthopnea.  Wilder Glade was started to manage diastolic heart failure.  It will also help preserve kidney function.  We have gotten an exception from his insurance company to use [place] due to indigent status.  Past Medical History:  Diagnosis Date   Carotid stenosis, left    Cataracts, bilateral    removed   Coronary artery disease    Hyperlipemia    Prostate CA (HCC)    Sleep apnea    Dr Ledora Bottcher ; uses CPAP   Stroke ([place])    Type II or unspecified type diabetes mellitus without mention of complication, not stated as uncontrolled    Unspecified essential hypertension     Past Surgical History:  Procedure Laterality Date   APPENDECTOMY     CARDIAC CATHETERIZATION     CATARACT EXTRACTION, BILATERAL     CORONARY ARTERY BYPASS GRAFT N/A 06/27/2017   Procedure: CORONARY ARTERY BYPASS GRAFTING (CABG), time four   using the left internal mammary artery, and right saphenous leg vein harvested endoscopically.;  Surgeon: Ivin Poot, MD;  Location: [place];  Service: Open Heart Surgery;  Laterality: N/A;   CYSTOSCOPY N/A 06/27/2017   Procedure: CYSTOSCOPY FLEXIBLE, Balloon dilatation placement of foley catheter;   Surgeon: Ivin Poot, MD;  Location: [place];  Service: Open Heart Surgery;  Laterality: N/A;   ENDARTERECTOMY Left 08/22/2017   Procedure: ENDARTERECTOMY CAROTID LEFT;  Surgeon: Serafina Mitchell, MD;  Location: [facility] OR;  Service: Vascular;  Laterality: Left;   INTRAVASCULAR ULTRASOUND/IVUS N/A 06/24/2017   Procedure: Intravascular Ultrasound/IVUS;  Surgeon: Belva Crome, MD;  Location: [place] [facility] LAB;  Service: Cardiovascular;  Laterality: N/A;  LEFT MAIN   LEFT HEART CATH AND CORONARY ANGIOGRAPHY N/A 06/24/2017   Procedure: LEFT HEART CATH AND CORONARY ANGIOGRAPHY;  Surgeon: Belva Crome, MD;  Location: [place] [facility];  Service: Cardiovascular;  Laterality: N/A;   PATCH ANGIOPLASTY Left 08/22/2017   Procedure: PATCH ANGIOPLASTY USING Rueben Bash BIOLOGIC PATCH;  Surgeon: Serafina Mitchell, MD;  Location: [facility] OR;  Service: Vascular;  Laterality: Left;   PROSTATECTOMY     TEE WITHOUT CARDIOVERSION N/A 06/27/2017   Procedure: TRANSESOPHAGEAL ECHOCARDIOGRAM (TEE);  Surgeon: Prescott Gum, Collier Salina, MD;  Location: [place];  Service: Open Heart Surgery;  Laterality: N/A;   TONSILLECTOMY     ULTRASOUND GUIDANCE FOR VASCULAR ACCESS  06/24/2017   Procedure: Ultrasound Guidance For Vascular Access;  Surgeon: Belva Crome, MD;  Location: [place] [facility];  Service: Cardiovascular;;    Current Medications: Current Meds  Medication Sig   Accu-Chek FastClix Lancets MISC USE 1  TO CHECK GLUCOSE TWICE DAILY  aspirin 325 MG tablet Take 1 tablet (325 mg total) by mouth daily.   carvedilol (COREG) 25 MG tablet Take 1 tablet (25 mg total) by mouth 2 (two) times daily.   Cholecalciferol (VITAMIN D3) 1000 UNITS CAPS Take 1,000 Units by mouth daily.   Cyanocobalamin (VITAMIN B-12) 1000 MCG SUBL Place 1 tablet (1,000 mcg total) under the tongue daily.   folic acid (FOLVITE) 1 MG tablet Take 1 tablet by mouth once daily   furosemide (LASIX) 40 MG tablet Take one q am and one at hs prn swelling   glucose blood  (ACCU-CHEK SMARTVIEW) test strip USE 1 STRIP TO CHECK GLUCOSE TWICE DAILY E11.9   glucose blood (ACCU-CHEK SMARTVIEW) test strip USE 1 STRIP TO CHECK GLUCOSE  DAILY   hydrALAZINE (APRESOLINE) 25 MG tablet Take 1 tablet (25 mg total) by mouth 3 (three) times daily.   hydrocortisone 2.5 % cream Apply topically 2 (two) times daily.   LINZESS 290 MCG CAPS capsule TAKE 1 CAPSULE BY MOUTH ONCE DAILY AS NEEDED   metFORMIN (GLUCOPHAGE) 1000 MG tablet TAKE 1 TABLET BY MOUTH TWICE DAILY WITH A MEAL.   Multiple Vitamins-Minerals (MACULAR VITAMIN BENEFIT) TABS Take 1 tablet by mouth 2 (two) times daily.   nitroGLYCERIN (NITROSTAT) 0.4 MG SL tablet Place 1 tablet (0.4 mg total) under the tongue every 5 (five) minutes as needed for chest pain.   oxymetazoline (AFRIN NASAL SPRAY) 0.05 % nasal spray Place 1 spray into both nostrils 2 (two) times daily. Use for 1 wek then stop   PREVIDENT 5000 DRY MOUTH 1.1 % GEL dental gel USE ONCE DAILY IN PLACE OF REGULAR TOOTHPASTE.   repaglinide (PRANDIN) 1 MG tablet Take 1 tablet (1 mg total) by mouth 3 (three) times daily before meals.   simvastatin (ZOCOR) 40 MG tablet Take 1 tablet (40 mg total) by mouth every evening.   thiamine 100 MG tablet Take 1 tablet (100 mg total) by mouth daily.   [DISCONTINUED] dapagliflozin propanediol (FARXIGA) 10 MG TABS tablet Take 1 tablet (10 mg total) by mouth daily before breakfast.     Allergies:   Amlodipine, Verapamil, Clindamycin hcl, and Contrast media [iodinated diagnostic agents]   Social History   Socioeconomic History   Marital status: Married    Spouse name: Not on file   Number of children: Not on file   Years of education: Not on file   Highest education level: Bachelor's degree (e.g., BA, AB, BS)  Occupational History   Occupation: Salesman  Tobacco Use   Smoking status: Never   Smokeless tobacco: Never  Vaping Use   Vaping Use: Never used  Substance and Sexual Activity   Alcohol use: No    Alcohol/week: 0.0  standard drinks   Drug use: No   Sexual activity: Not on file  Other Topics Concern   Not on file  Social History Narrative   Regular Exercise- no, golf   Social Determinants of Health   Financial Resource Strain: Not on file  Food Insecurity: Not on file  Transportation Needs: Not on file  Physical Activity: Not on file  Stress: Not on file  Social Connections: Not on file     Family History: The patient's family history includes Diabetes in an other family member; Heart disease in his mother; Hypertension in an other family member. There is no history of Colon cancer.  ROS:   Please see the history of present illness.    No concerns.  Edema did not significantly change after  adding [place].  He is already on 40 mg of Lasix twice a day.  Neck veins are flat on exam.  Further increase in diuretic therapy is not indicated.  All other systems reviewed and are negative.  EKGs/Labs/Other Studies Reviewed:    The following studies were reviewed today: Echocardiogram performed in December revealed EF of 60%.  He had indeterminate diastolic parameters but evidence of left atrial hypertension.  EKG:  EKG not performed  Recent Labs: 06/16/2020: Hemoglobin 13.1; Platelets 240.0 01/20/2021: ALT 12; TSH 2.28 02/07/2021: BUN 29; Creatinine, Ser 1.39; Potassium 4.6; Sodium 141  Recent Lipid Panel    Component Value Date/Time   CHOL 125 06/16/2020 1019   CHOL 137 09/19/2018 0743   TRIG 99.0 06/16/2020 1019   HDL 41.10 06/16/2020 1019   HDL 39 (L) 09/19/2018 0743   CHOLHDL 3 06/16/2020 1019   VLDL 19.8 06/16/2020 1019   LDLCALC 64 06/16/2020 1019   LDLCALC 67 09/19/2018 0743    Physical Exam:    VS:  BP 138/60   Pulse 65   Ht 6\' 1"  (1.854 m)   Wt 246 lb 12.8 oz (111.9 kg)   SpO2 97%   BMI 32.56 kg/m     Wt Readings from Last 3 Encounters:  03/08/21 246 lb 12.8 oz (111.9 kg)  01/23/21 246 lb (111.6 kg)  01/20/21 248 lb (112.5 kg)     GEN: Obese. No acute distress HEENT:  Normal NECK: No JVD. LYMPHATICS: No lymphadenopathy CARDIAC: No murmur. RRR no gallop, but with 2+ ankle edema. VASCULAR:  Normal Pulses. No bruits. RESPIRATORY:  Clear to auscultation without rales, wheezing or rhonchi  ABDOMEN: Soft, non-tender, non-distended, No pulsatile mass, MUSCULOSKELETAL: No deformity  SKIN: Warm and dry NEUROLOGIC:  Alert and oriented x 3 PSYCHIATRIC:  Normal affect   ASSESSMENT:    1. Coronary artery disease involving coronary bypass graft of native heart with angina pectoris ([place])   2. Essential hypertension   3. Bilateral carotid artery stenosis   4. Chronic combined systolic and diastolic heart failure ([place])   5. TIA (transient ischemic attack)   6. Dyslipidemia   7. Type 2 diabetes mellitus with other circulatory complication, without long-term current use of insulin (HCC)    PLAN:    In order of problems listed above:  Clinically stable.  Risk factor modification reviewed.  The importance of moderate aerobic activity emphasized. Adequate blood pressure control on current medical regimen.  Target 130/80 mmHg.  Next year's target room moved to 140/80 mmHg. Aggressive secondary prevention Systolic component of heart failure has resolved.  Diastolic heart failure is being managed with SGLT2 therapy. Antiplatelet therapy will be continued. Continue high intensity Zocor therapy. SGLT2 has been added.  Mildly impaired kidney function she had received protection on SGLT2, Farxiga.  Overall education and awareness concerning primary/secondary risk prevention was discussed in detail: LDL less than 70, hemoglobin A1c less than 7, blood pressure target less than 130/80 mmHg, >150 minutes of moderate aerobic activity per week, avoidance of smoking, weight control (via diet and exercise), and continued surveillance/management of/for obstructive sleep apnea.   Lower extremity edema should be treated with bilateral lower extremity compression  stockings.   Medication Adjustments/Labs and Tests Ordered: Current medicines are reviewed at length with the patient today.  Concerns regarding medicines are outlined above.  No orders of the defined types were placed in this encounter.  Meds ordered this encounter  Medications   dapagliflozin propanediol (FARXIGA) 10 MG TABS tablet  Sig: Take 1 tablet (10 mg total) by mouth daily before breakfast.    Dispense:  90 tablet    Refill:  3    The patient states that he has already been [place]through [place] and [place] for [place]st for [place].     Patient Instructions  Medication Instructions:  Your physician recommends that you continue on your current medications as directed. Please refer to the Current Medication list given to you today.   *If you need a refill on your cardiac medications before your next appointment, please call your pharmacy*   Lab Work: None ordered   If you have labs (blood work) drawn today and your tests are completely normal, you will receive your results only by: [place] (if you have MyChart) OR A paper copy in the mail If you have any lab test that is abnormal or we need to change your treatment, we will call you to review the results.   Testing/Procedures: None ordered    Follow-Up: At [facility], you and your health needs are our priority.  As part of our continuing mission to provide you with exceptional heart care, we have created designated Provider Care Teams.  These Care Teams include your primary Cardiologist (physician) and Advanced Practice Providers (APPs -  Physician Assistants and Nurse Practitioners) who all work together to provide you with the care you need, when you need it.  We recommend signing up for the patient portal called "MyChart".  Sign up information is provided on this After Visit Summary.  MyChart is used to connect with patients for Virtual Visits (Telemedicine).  Patients are able to view lab/test results,  encounter notes, upcoming appointments, etc.  Non-urgent messages can be sent to your provider as well.   To learn more about what you can do with MyChart, go to [email or web address].    Your next appointment:   9 -12 month(s)  The format for your next appointment:   In Person  Provider:   You may see Sinclair Grooms, MD or one of the following Advanced Practice Providers on your designated Care Team:   Cecilie Kicks, NP    Other Instructions None     Signed, Sinclair Grooms, MD  03/08/2021 12:37 PM    [place]

## 2021-03-08 ENCOUNTER — Other Ambulatory Visit: Payer: Self-pay

## 2021-03-08 ENCOUNTER — Ambulatory Visit (INDEPENDENT_AMBULATORY_CARE_PROVIDER_SITE_OTHER): Payer: Medicare Other | Admitting: Interventional Cardiology

## 2021-03-08 ENCOUNTER — Encounter: Payer: Self-pay | Admitting: Interventional Cardiology

## 2021-03-08 VITALS — BP 138/60 | HR 65 | Ht 73.0 in | Wt 246.8 lb

## 2021-03-08 DIAGNOSIS — E785 Hyperlipidemia, unspecified: Secondary | ICD-10-CM

## 2021-03-08 DIAGNOSIS — I10 Essential (primary) hypertension: Secondary | ICD-10-CM | POA: Diagnosis not present

## 2021-03-08 DIAGNOSIS — I5042 Chronic combined systolic (congestive) and diastolic (congestive) heart failure: Secondary | ICD-10-CM

## 2021-03-08 DIAGNOSIS — G459 Transient cerebral ischemic attack, unspecified: Secondary | ICD-10-CM | POA: Diagnosis not present

## 2021-03-08 DIAGNOSIS — E1159 Type 2 diabetes mellitus with other circulatory complications: Secondary | ICD-10-CM | POA: Diagnosis not present

## 2021-03-08 DIAGNOSIS — I6523 Occlusion and stenosis of bilateral carotid arteries: Secondary | ICD-10-CM

## 2021-03-08 DIAGNOSIS — I25709 Atherosclerosis of coronary artery bypass graft(s), unspecified, with unspecified angina pectoris: Secondary | ICD-10-CM

## 2021-03-08 MED ORDER — DAPAGLIFLOZIN PROPANEDIOL 10 MG PO TABS: 10.0000 mg | ORAL_TABLET | Freq: Every day | ORAL | 3 refills | Status: DC

## 2021-03-08 NOTE — Patient Instructions (Addendum)
Medication Instructions:  Your physician recommends that you continue on your current medications as directed. Please refer to the Current Medication list given to you today.   *If you need a refill on your cardiac medications before your next appointment, please call your pharmacy*   Lab Work: None ordered   If you have labs (blood work) drawn today and your tests are completely normal, you will receive your results only by: [place] (if you have MyChart) OR A paper copy in the mail If you have any lab test that is abnormal or we need to change your treatment, we will call you to review the results.   Testing/Procedures: None ordered    Follow-Up: At [facility], you and your health needs are our priority.  As part of our continuing mission to provide you with exceptional heart care, we have created designated Provider Care Teams.  These Care Teams include your primary Cardiologist (physician) and Advanced Practice Providers (APPs -  Physician Assistants and Nurse Practitioners) who all work together to provide you with the care you need, when you need it.  We recommend signing up for the patient portal called "MyChart".  Sign up information is provided on this After Visit Summary.  MyChart is used to connect with patients for Virtual Visits (Telemedicine).  Patients are able to view lab/test results, encounter notes, upcoming appointments, etc.  Non-urgent messages can be sent to your provider as well.   To learn more about what you can do with MyChart, go to [email or web address].    Your next appointment:   9 -12 month(s)  The format for your next appointment:   In Person  Provider:   You may see Sinclair Grooms, MD or one of the following Advanced Practice Providers on your designated Care Team:   Cecilie Kicks, NP    Other Instructions None

## 2021-03-09 ENCOUNTER — Ambulatory Visit: Payer: Medicare Other | Admitting: Cardiology

## 2021-03-09 ENCOUNTER — Telehealth: Payer: Self-pay | Admitting: Interventional Cardiology

## 2021-03-09 NOTE — Telephone Encounter (Signed)
Pt c/o medication issue:  1. Name of Medication:  dapagliflozin propanediol (FARXIGA) 10 MG TABS tablet  2. How are you currently taking this medication (dosage and times per day)?   3. Are you having a reaction (difficulty breathing--STAT)?   4. What is your medication issue?   Patient is requesting to speak with someone regarding application for [employer] patient assistance for [place]. He states he has questions.

## 2021-03-09 NOTE — Telephone Encounter (Signed)
**Note De-Identified Larry [name] Obfuscation** The pt called to ask that I send his Larry [name] refill to [place] as he has not received it yet and was approved a month ago. I advised him that I would call to check on this as I e-scribed his Larry [name] RX to [employer] Emergency planning/management officer for [place] and [place]) on 6/23.  Per [name] with [place] and [place] the pt applied over the phone for pt asst with them as "Larry [name] and that we sent his Larry [name] in to fill for "[employer]. She states there were 2 accounts opened for this pt  and that she has corrected it by cancelling "[name]" account and that the only account for the pt nowis in [employer] name.  [name] states that the pts Larry [name] will arrive at his home address within 10 to 15 days.  I have made the pt aware of this and he express much appreciation for my follow up on this matter.

## 2021-03-17 ENCOUNTER — Encounter: Payer: Self-pay | Admitting: Gastroenterology

## 2021-03-22 DIAGNOSIS — C61 Malignant neoplasm of prostate: Secondary | ICD-10-CM | POA: Diagnosis not present

## 2021-03-22 DIAGNOSIS — N393 Stress incontinence (female) (male): Secondary | ICD-10-CM | POA: Diagnosis not present

## 2021-03-22 DIAGNOSIS — N5201 Erectile dysfunction due to arterial insufficiency: Secondary | ICD-10-CM | POA: Diagnosis not present

## 2021-03-23 ENCOUNTER — Telehealth: Payer: Self-pay | Admitting: Interventional Cardiology

## 2021-03-23 NOTE — Telephone Encounter (Signed)
Yes, Larry [name] can cause genital yeast infections. He should probably hold until the infection clears. I think he could resume taking [place], but if it happens for a second time, then we should probably stop.

## 2021-03-23 NOTE — Telephone Encounter (Signed)
Pt c/o medication issue:  1. Name of Medication: dapagliflozin propanediol (FARXIGA) 10 MG TABS tablet  2. How are you currently taking this medication (dosage and times per day)? As directed for 4-6 weeks  3. Are you having a reaction (difficulty breathing--STAT)?   4. What is your medication issue? Balanitis (yeast infection of the Penis). Redness, itching, swelling of the penis.  Patient started noticing these symptoms within the past 1-2 weeks. The symptoms started mild and they got progressively worse over time.   The patient saw his Urologist to get some topical cream to help. He was not sure if he should stop taking the medication until these symptoms heal or not. Please advise

## 2021-03-23 NOTE — Telephone Encounter (Signed)
Spoke with pt and made him aware of information and recommendations.  Pt agreeable to plan.

## 2021-03-23 NOTE — Telephone Encounter (Signed)
Have you ever heard of Farxiga causing these types of symptoms?  Would it be beneficial to hold until it clears?

## 2021-03-24 NOTE — Telephone Encounter (Signed)
Stop until healed.  May resume thereafter, but if recurs, should DC .

## 2021-04-05 ENCOUNTER — Telehealth: Payer: Self-pay | Admitting: Lab

## 2021-04-05 NOTE — Chronic Care Management (AMB) (Signed)
  Chronic Care Management   Note  04/05/2021 Name: Larry [name] MRN: [id number] DOB: 1941-07-06  Larry [name] is a 80 y.o. year old male who is a primary care patient of Plotnikov, [name], MD. I reached out to [name] by phone today in response to a referral sent by Larry [name] PCP, Plotnikov, [name], MD.   Larry [name] was given information about Chronic Care Management services today including:  CCM service includes personalized support from designated clinical staff supervised by his physician, including individualized plan of care and coordination with other care providers 24/7 contact phone numbers for assistance for urgent and routine care needs. Service will only be billed when office clinical staff spend 20 minutes or more in a month to coordinate care. Only one practitioner may furnish and bill the service in a calendar month. The patient may stop CCM services at any time (effective at the end of the month) by phone call to the office staff.   Patient agreed to services and verbal consent obtained.   Follow up plan:   [place]

## 2021-04-06 DIAGNOSIS — H35373 Puckering of macula, bilateral: Secondary | ICD-10-CM | POA: Diagnosis not present

## 2021-04-06 DIAGNOSIS — E113293 Type 2 diabetes mellitus with mild nonproliferative diabetic retinopathy without macular edema, bilateral: Secondary | ICD-10-CM | POA: Diagnosis not present

## 2021-04-06 DIAGNOSIS — H40013 Open angle with borderline findings, low risk, bilateral: Secondary | ICD-10-CM | POA: Diagnosis not present

## 2021-04-06 DIAGNOSIS — H353132 Nonexudative age-related macular degeneration, bilateral, intermediate dry stage: Secondary | ICD-10-CM | POA: Diagnosis not present

## 2021-04-06 LAB — HM DIABETES EYE EXAM

## 2021-04-14 ENCOUNTER — Telehealth: Payer: Self-pay | Admitting: *Deleted

## 2021-04-14 ENCOUNTER — Encounter: Payer: Self-pay | Admitting: Internal Medicine

## 2021-04-14 NOTE — Telephone Encounter (Signed)
Abstracted eye exam../lmb

## 2021-04-26 DIAGNOSIS — L570 Actinic keratosis: Secondary | ICD-10-CM | POA: Diagnosis not present

## 2021-04-26 DIAGNOSIS — L821 Other seborrheic keratosis: Secondary | ICD-10-CM | POA: Diagnosis not present

## 2021-04-26 DIAGNOSIS — Z859 Personal history of malignant neoplasm, unspecified: Secondary | ICD-10-CM | POA: Diagnosis not present

## 2021-04-26 DIAGNOSIS — L578 Other skin changes due to chronic exposure to nonionizing radiation: Secondary | ICD-10-CM | POA: Diagnosis not present

## 2021-04-26 DIAGNOSIS — D1801 Hemangioma of skin and subcutaneous tissue: Secondary | ICD-10-CM | POA: Diagnosis not present

## 2021-04-28 ENCOUNTER — Other Ambulatory Visit: Payer: Self-pay | Admitting: Internal Medicine

## 2021-05-05 ENCOUNTER — Telehealth: Payer: Self-pay

## 2021-05-05 NOTE — Chronic Care Management (AMB) (Signed)
Chronic Care Management Pharmacy Assistant   Name: Larry [name]  MRN: [id number] DOB: [birth year]  Larry [name] is an 80 y.o. year old male who presents for his initial CCM visit with the clinical pharmacist.   Recent office visits:  01/20/21 Lew Dawes MD PCP- Pt was seen for B12 deficiency. Labs were ordered and pt's Cefdinir was discontinued. Follow up in 4 months  Recent consult visits:  03/08/21 Daneen Schick MD Cardiology- pt was seen for CAD. No med changes or labs. Follow up 9-12 months.  01/23/21 Daneen Schick MD Cardiology- pt was seen for Chronic combined systolic and diastolic heart failure. Labs were ordered and an EKG was performed. Pt was started on Dapagliflozin Propanediol 10 mg before breakfast. Follow up 6-8 weeks.  11/07/20 Deirdre Pippins Internal Medicine- pt was seen for Allergic Dermatitis. Unable to view documentation.  Hospital visits:  None in previous 6 months  Medications: Outpatient Encounter Medications as of 05/05/2021  Medication Sig   Accu-Chek FastClix Lancets MISC USE 1  TO CHECK GLUCOSE TWICE DAILY   aspirin 325 MG tablet Take 1 tablet (325 mg total) by mouth daily.   carvedilol (COREG) 25 MG tablet Take 1 tablet (25 mg total) by mouth 2 (two) times daily.   Cholecalciferol (VITAMIN D3) 1000 UNITS CAPS Take 1,000 Units by mouth daily.   Cyanocobalamin (VITAMIN B-12) 1000 MCG SUBL Place 1 tablet (1,000 mcg total) under the tongue daily.   dapagliflozin propanediol (FARXIGA) 10 MG TABS tablet Take 1 tablet (10 mg total) by mouth daily before breakfast.   fluticasone (FLONASE) 50 MCG/ACT nasal spray Place 2 sprays into both nostrils daily. Use for 1 month then prn (Patient not taking: Reported on [id number])   folic acid (FOLVITE) 1 MG tablet Take 1 tablet by mouth once daily   furosemide (LASIX) 40 MG tablet Take one q am and one at hs prn swelling   glucose blood (ACCU-CHEK SMARTVIEW) test strip USE 1 STRIP TO CHECK GLUCOSE TWICE DAILY E11.9    glucose blood (ACCU-CHEK SMARTVIEW) test strip USE 1 STRIP TO CHECK GLUCOSE  DAILY   hydrALAZINE (APRESOLINE) 25 MG tablet TAKE 1 TABLET BY MOUTH THREE TIMES DAILY   hydrocortisone 2.5 % cream Apply topically 2 (two) times daily.   LINZESS 290 MCG CAPS capsule TAKE 1 CAPSULE BY MOUTH ONCE DAILY AS NEEDED   loratadine (CLARITIN) 10 MG tablet Take 1 tablet (10 mg total) by mouth daily. Use for 1 month then prn (Patient not taking: Reported on 03/08/2021)   metFORMIN (GLUCOPHAGE) 1000 MG tablet TAKE 1 TABLET BY MOUTH TWICE DAILY WITH A MEAL   Multiple Vitamins-Minerals (MACULAR VITAMIN BENEFIT) TABS Take 1 tablet by mouth 2 (two) times daily.   nitroGLYCERIN (NITROSTAT) 0.4 MG SL tablet Place 1 tablet (0.4 mg total) under the tongue every 5 (five) minutes as needed for chest pain.   oxymetazoline (AFRIN NASAL SPRAY) 0.05 % nasal spray Place 1 spray into both nostrils 2 (two) times daily. Use for 1 wek then stop   PREVIDENT 5000 DRY MOUTH 1.1 % GEL dental gel USE ONCE DAILY IN PLACE OF REGULAR TOOTHPASTE.   repaglinide (PRANDIN) 1 MG tablet Take 1 tablet (1 mg total) by mouth 3 (three) times daily before meals.   simvastatin (ZOCOR) 40 MG tablet Take 1 tablet (40 mg total) by mouth every evening.   thiamine 100 MG tablet Take 1 tablet (100 mg total) by mouth daily.   No facility-administered encounter medications on file as  of 05/05/2021.    Current Medication List carvedilol (COREG) 25 MG tablet last filled 04/28/21 90 DS Cholecalciferol (VITAMIN D3) 1000 UNITS CAPS Cyanocobalamin (VITAMIN B-12) 1000 MCG SUBL dapagliflozin propanediol 10 MG TABS tablet last filled 03/06/21 30 DS fluticasone (FLONASE) 50 MCG/ACT nasal spray last filled [id number] DS folic acid (FOLVITE) 1 MG tablet last filled 03/09/21 90 DS furosemide (LASIX) 40 MG tablet last filled 01/17/21 90 DS hydrALAZINE (APRESOLINE) 25 MG tablet last filled 04/28/21 90 DS hydrocortisone 2.5 % crea, LINZESS 290 MCG CAPS capsule loratadine  (CLARITIN) 10 MG tablet last filled 03/09/21 100 DS metFORMIN (GLUCOPHAGE) 1000 MG tablet last filled Multiple Vitamins-Minerals (MACULAR VITAMIN BENEFIT) TABS oxymetazoline (AFRIN NASAL SPRAY) 0.05 % nasal spray last filled 09/27/20 14 DS PREVIDENT 5000 DRY MOUTH 1.1 % GEL dental gel repaglinide (PRANDIN) 1 MG tablet last filled 03/09/21 90 DS simvastatin (ZOCOR) 40 MG tablet last filled 03/09/21 90 DS thiamine 100 MG tablet last filled 03/26/20 100 DS   [place] Pharmacist Assistant [phone]

## 2021-05-11 ENCOUNTER — Ambulatory Visit (INDEPENDENT_AMBULATORY_CARE_PROVIDER_SITE_OTHER): Payer: Medicare Other

## 2021-05-11 ENCOUNTER — Other Ambulatory Visit: Payer: Self-pay

## 2021-05-11 DIAGNOSIS — R600 Localized edema: Secondary | ICD-10-CM

## 2021-05-11 DIAGNOSIS — E1159 Type 2 diabetes mellitus with other circulatory complications: Secondary | ICD-10-CM

## 2021-05-11 DIAGNOSIS — I25709 Atherosclerosis of coronary artery bypass graft(s), unspecified, with unspecified angina pectoris: Secondary | ICD-10-CM

## 2021-05-11 DIAGNOSIS — E785 Hyperlipidemia, unspecified: Secondary | ICD-10-CM

## 2021-05-11 DIAGNOSIS — I10 Essential (primary) hypertension: Secondary | ICD-10-CM

## 2021-05-11 NOTE — Progress Notes (Addendum)
Chronic Care Management Pharmacy Note  05/11/2021 Name:  Larry [name] MRN:  [id number] DOB:  Jun 07, 1941  Summary: -Patient reports that he has stopped taking Farxiga due to development of yeast infection after taking for about 2-3 weeks - has been prescribed antifungal creams by his urologist -was unsure of the names at this time but will call office back to confirm so med list can be updated  - Reports that he has been monitoring blood pressure and blood sugars daily - typically blood pressure is averaging 130-140/70's - blood sugars are averaging 150-160 -Notes to swelling in his legs, reports he has been taking furosemide $RemoveBeforeDEI'40mg'XPzpBAkeuYgIeBFE$  in the mid afternoon - has not been taking a second tablet in the day because of nocturia   Recommendations/Changes made from today's visit: -Recommending for patient to continue hold of farxiga until yeast infection has cleared - possibly retrial once it has been cleared by urologist -Reviewed carb counting and dietary changes that can help improve BG to goal / sodium reduction that can reduce swelling in legs and lower blood pressure -Advised for patient to start taking furosemide $RemoveBeforeDEI'40mg'NaONwuNhkBVvnUMu$  in the AM and start with $RemoveBe'20mg'DRrWkuuUw$  in the afternoon as needed for swelling - patient will confirm metoprolol dosing when he gets home as well   Subjective: Larry [name] is an 80 y.o. year old male who is a primary patient of Plotnikov, [name], MD.  The CCM team was consulted for assistance with disease management and care coordination needs.    Engaged with patient face to face for initial visit in response to provider referral for pharmacy case management and/or care coordination services.   Consent to Services:  The patient was given the following information about Chronic Care Management services today, agreed to services, and gave verbal consent: 1. CCM service includes personalized support from designated clinical staff supervised by the primary care provider, including  individualized plan of care and coordination with other care providers 2. 24/7 contact phone numbers for assistance for urgent and routine care needs. 3. Service will only be billed when office clinical staff spend 20 minutes or more in a month to coordinate care. 4. Only one practitioner may furnish and bill the service in a calendar month. 5.The patient may stop CCM services at any time (effective at the end of the month) by phone call to the office staff. 6. The patient will be responsible for cost sharing (co-pay) of up to 20% of the service fee (after annual deductible is met). Patient agreed to services and consent obtained.  Patient Care Team: Plotnikov, [name], MD as PCP - General (Internal Medicine) Belva Crome, MD as PCP - Cardiology (Cardiology) Sueanne Margarita, MD as PCP - Sleep Medicine (Cardiology) Kathie Rhodes, MD (Inactive) as Consulting Physician (Urology) Kristeen Miss, MD as Consulting Physician (Neurosurgery) Tomasa Blase, [facility] as Pharmacist (Pharmacist)  Recent office visits:  01/20/21 Lew Dawes MD PCP- Pt was seen for B12 deficiency. Labs were ordered and pt's Cefdinir was discontinued. Follow up in 4 months   Recent consult visits:  03/08/21 Daneen Schick MD Cardiology- pt was seen for CHF - doing well since starting farxiga. No med changes or labs. Follow up 9-12 months.   01/23/21 Daneen Schick MD Cardiology- pt was seen for Chronic combined systolic and diastolic heart failure. Labs were ordered and an EKG was performed. Pt was started on Farxiga 10 mg before breakfast. Follow up 6-8 weeks.   12/02/20 Pietro Cassis PA-C Otolaryngology - presents  for hearing evaluation trial of flonase nasal spray  11/07/20 Deirdre Pippins Internal Medicine- pt was seen for Allergic Dermatitis. Unable to view documentation.   Hospital visits:  None in previous 6 months  Objective:  Lab Results  Component Value Date   CREATININE 1.39 (H) 02/07/2021   BUN 29 (H) 02/07/2021    GFR 55.42 (L) 01/20/2021   GFRNONAA 44 (L) 11/23/2018   GFRAA 51 (L) 11/23/2018   NA 141 02/07/2021   K 4.6 02/07/2021   CALCIUM 9.7 02/07/2021   CO2 22 02/07/2021   GLUCOSE 216 (H) 02/07/2021    Lab Results  Component Value Date/Time   HGBA1C 7.7 (H) 01/20/2021 10:22 AM   HGBA1C 7.1 (H) 09/15/2020 10:57 AM   GFR 55.42 (L) 01/20/2021 10:22 AM   GFR 54.50 (L) 09/15/2020 10:57 AM    Last diabetic Eye exam:  Lab Results  Component Value Date/Time   HMDIABEYEEXA No Retinopathy 04/06/2021 12:00 AM    Last diabetic Foot exam:  No results found for: HMDIABFOOTEX   Lab Results  Component Value Date   CHOL 125 06/16/2020   HDL 41.10 06/16/2020   LDLCALC 64 06/16/2020   TRIG 99.0 06/16/2020   CHOLHDL 3 06/16/2020    Hepatic Function Latest Ref Rng & Units 01/20/2021 09/15/2020 06/16/2020  Total Protein 6.0 - 8.3 g/dL 6.5 6.8 6.4  Albumin 3.5 - 5.2 g/dL 4.0 4.0 4.0  AST 0 - 37 U/L $Remo'24 18 13  'wtmMU$ ALT 0 - 53 U/L $Remo'12 14 12  'jGkiH$ Alk Phosphatase 39 - 117 U/L 65 68 61  Total Bilirubin 0.2 - 1.2 mg/dL 0.5 0.5 0.6  Bilirubin, Direct 0.0 - 0.3 mg/dL - - -    Lab Results  Component Value Date/Time   TSH 2.28 01/20/2021 10:22 AM   TSH 2.62 06/16/2020 10:19 AM   FREET4 0.81 01/20/2021 10:22 AM   FREET4 0.87 06/16/2020 10:19 AM    CBC Latest Ref Rng & Units 06/16/2020 06/24/2019 01/28/2019  WBC 4.0 - 10.5 K/uL 9.4 9.2 10.4  Hemoglobin 13.0 - 17.0 g/dL 13.1 12.1(L) 10.7(L)  Hematocrit 39.0 - 52.0 % 39.4 37.0(L) 32.3(L)  Platelets 150.0 - 400.0 K/uL 240.0 236.0 239.0    No results found for: VD25OH  Clinical ASCVD: Yes  The ASCVD Risk score (Arnett DK, et al., 2019) failed to calculate for the following reasons:   The patient has a prior MI or stroke diagnosis    Depression screen [facility] 2/9 09/15/2020 03/24/2019 03/10/2018  Decreased Interest 0 0 0  Down, Depressed, Hopeless 0 0 0  PHQ - 2 Score 0 0 0  Some recent data might be hidden    Social History   Tobacco Use  Smoking Status  Never  Smokeless Tobacco Never   BP Readings from Last 3 Encounters:  03/08/21 138/60  01/23/21 132/60  01/20/21 124/68   Pulse Readings from Last 3 Encounters:  03/08/21 65  01/23/21 66  01/20/21 69   Wt Readings from Last 3 Encounters:  03/08/21 246 lb 12.8 oz (111.9 kg)  01/23/21 246 lb (111.6 kg)  01/20/21 248 lb (112.5 kg)   BMI Readings from Last 3 Encounters:  03/08/21 32.56 kg/m  01/23/21 33.36 kg/m  01/20/21 33.63 kg/m    Assessment/Interventions: Review of patient past medical history, allergies, medications, health status, including review of consultants reports, laboratory and other test data, was performed as part of comprehensive evaluation and provision of chronic care management services.   SDOH:  (Social Determinants of Health) assessments and  interventions performed: Yes  SDOH Screenings   Alcohol Screen: Not on file  Depression (PHQ2-9): Low Risk    PHQ-2 Score: 0  Financial Resource Strain: Not on file  Food Insecurity: Not on file  Housing: Not on file  Physical Activity: Not on file  Social Connections: Not on file  Stress: Not on file  Tobacco Use: Low Risk    Smoking Tobacco Use: Never   Smokeless Tobacco Use: Never  Transportation Needs: Not on file    CCM Care Plan  Allergies  Allergen Reactions   Amlodipine Swelling    Leg swelling   Verapamil Swelling    Edema w/high dose   Clindamycin Hcl     UNSPECIFIED REACTION    Contrast Media [Iodinated Diagnostic Agents] Nausea And Vomiting    Medications Reviewed Today     Reviewed by Belva Crome, MD (Physician) on 03/08/21 at 1237  Med List Status: <None>   Medication Order Taking? Sig Documenting Provider Last Dose Status Informant  Accu-Chek FastClix Lancets MISC [id number] Yes USE 1  TO CHECK GLUCOSE TWICE DAILY Plotnikov, [name], MD Taking Active   aspirin 325 MG tablet [id number] Yes Take 1 tablet (325 mg total) by mouth daily. Desiree Hane, MD Taking Active    carvedilol (COREG) 25 MG tablet [id number] Yes Take 1 tablet (25 mg total) by mouth 2 (two) times daily. Plotnikov, [name], MD Taking Active   Cholecalciferol (VITAMIN D3) 1000 UNITS CAPS [id number] Yes Take 1,000 Units by mouth daily. [provider] Taking Active Spouse/Significant Other  Cyanocobalamin (VITAMIN B-12) 1000 MCG SUBL [id number] Yes Place 1 tablet (1,000 mcg total) under the tongue daily. Plotnikov, [name], MD Taking Active   dapagliflozin propanediol (FARXIGA) 10 MG TABS tablet [id number]  Take 1 tablet (10 mg total) by mouth daily before breakfast. Belva Crome, MD  Active   fluticasone [facility]) 50 MCG/ACT nasal spray [id number] No Place 2 sprays into both nostrils daily. Use for 1 month then prn  Patient not taking: Reported on 03/08/2021   Plotnikov, [name], MD Not Taking Active   folic acid (FOLVITE) 1 MG tablet [id number] Yes Take 1 tablet by mouth once daily Plotnikov, [name], MD Taking Active   furosemide (LASIX) 40 MG tablet [id number] Yes Take one q am and one at hs prn swelling Plotnikov, [name], MD Taking Active   glucose blood (ACCU-CHEK SMARTVIEW) test strip [id number] Yes USE 1 STRIP TO CHECK GLUCOSE TWICE DAILY E11.9 Plotnikov, [name], MD Taking Active   glucose blood (ACCU-CHEK SMARTVIEW) test strip [id number] Yes USE 1 STRIP TO CHECK GLUCOSE  DAILY Plotnikov, [name], MD Taking Active   hydrALAZINE (APRESOLINE) 25 MG tablet [id number] Yes Take 1 tablet (25 mg total) by mouth 3 (three) times daily. Plotnikov, [name], MD Taking Active   hydrocortisone 2.5 % cream [id number] Yes Apply topically 2 (two) times daily. [provider] Taking Active   LINZESS 290 MCG CAPS capsule [id number] Yes TAKE 1 CAPSULE BY MOUTH ONCE DAILY AS NEEDED Plotnikov, [name], MD Taking Active   loratadine (CLARITIN) 10 MG tablet [id number] No Take 1 tablet (10 mg total) by mouth daily. Use for 1 month then prn  Patient not taking: Reported on 03/08/2021    Plotnikov, [name], MD Not Taking Active   metFORMIN (GLUCOPHAGE) 1000 MG tablet [id number] Yes TAKE 1 TABLET BY MOUTH TWICE DAILY WITH A MEAL. Plotnikov, [name], MD Taking Active   Multiple Vitamins-Minerals (MACULAR VITAMIN BENEFIT) TABS  [id number] Yes Take 1 tablet by mouth 2 (two) times daily. [provider] Taking Active Spouse/Significant Other  nitroGLYCERIN (NITROSTAT) 0.4 MG SL tablet [id number] Yes Place 1 tablet (0.4 mg total) under the tongue every 5 (five) minutes as needed for chest pain. Plotnikov, Georgina Quint, MD Taking Active   oxymetazoline (AFRIN NASAL SPRAY) 0.05 % nasal spray [id number] Yes Place 1 spray into both nostrils 2 (two) times daily. Use for 1 wek then stop Plotnikov, Georgina Quint, MD Taking Active   PREVIDENT 5000 DRY MOUTH 1.1 % GEL dental gel [id number] Yes USE ONCE DAILY IN PLACE OF REGULAR TOOTHPASTE. [provider] Taking Active   repaglinide (PRANDIN) 1 MG tablet [id number] Yes Take 1 tablet (1 mg total) by mouth 3 (three) times daily before meals. Plotnikov, Georgina Quint, MD Taking Active   simvastatin (ZOCOR) 40 MG tablet [id number] Yes Take 1 tablet (40 mg total) by mouth every evening. Plotnikov, Georgina Quint, MD Taking Active   thiamine 100 MG tablet [id number] Yes Take 1 tablet (100 mg total) by mouth daily. 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) 01/13/2019   Cerebral embolism with cerebral infarction 11/20/2018   Close exposure to COVID-19  virus 11/19/2018   Fatigue 03/24/2018   Chronic combined systolic and diastolic heart failure (HCC) 08/25/2017   Carotid stenosis 08/22/2017   Coronary artery disease involving coronary bypass graft of native heart with angina pectoris (HCC) 06/27/2017   DOE (dyspnea on exertion) 06/18/2017   Acute encephalopathy 11/21/2016   Sciatic radiculitis 03/23/2016   Edema 04/23/2012   OBSTRUCTIVE SLEEP APNEA 10/23/2010   LOW BACK PAIN, ACUTE 07/19/2009   DM2 (diabetes mellitus, type 2) (HCC) 06/24/2007   Dyslipidemia 06/24/2007   Essential hypertension 06/24/2007   PROSTATE CANCER, HX OF 06/24/2007    Immunization History  Administered Date(s) Administered   Fluad Quad(high Dose 65+) 06/24/2019, 06/15/2020   Influenza Split 07/13/2011   Influenza Whole 05/30/2006, 05/27/2008, 07/19/2009, 08/24/2010   Influenza, High Dose Seasonal PF 08/07/2016, 06/18/2017, 06/25/2018   Influenza, Seasonal, Injecte, Preservative Fre 08/27/2012   Influenza,inj,Quad PF,6+ Mos 04/29/2013, 04/07/2014   Pneumococcal Conjugate-13 07/13/2019   Pneumococcal Polysaccharide-23 07/13/2011   Tdap 09/10/2019    Conditions to be addressed/monitored:  Hypertension, Hyperlipidemia, Diabetes, Heart Failure, and Allergic Rhinitis  There are no care plans that you recently modified to display for this patient.     Medication Assistance:  Marcelline Deist obtained through [facility] and Me medication assistance program.  Enrollment ends 08/19/2021  Patient's preferred pharmacy is:  [facility] [address] [place] - [address] [place] [address] Phone: [phone] Fax: [phone]  [facility] - [place], [place] - [address] [place] [address] Phone: [phone] Fax: [phone]   Uses pill box? Yes Pt endorses 100% compliance  Care Plan and Follow Up Patient Decision:  Patient agrees to Care Plan and Follow-up.  Plan: Telephone follow up appointment  with care management team member scheduled for:  2 months and The patient has been provided with contact information for the care management team and has been advised to call with any health related questions or concerns.   Tomasa Blase, PharmD Clinical Pharmacist, [place] screening examination/treatment/procedure(s) were performed by non-physician practitioner and as supervising physician I was immediately available for consultation/collaboration.  I agree with above. Lew Dawes, MD

## 2021-05-11 NOTE — Patient Instructions (Signed)
Larry [name],  It was great to talk to you today!  Please call me with any questions or concerns.  Visit Information   PATIENT GOALS:   Goals Addressed             This Visit's Progress    Monitor and Manage My Blood Sugar-Diabetes Type 2       Timeframe:  Long-Range Goal Priority:  High Start Date:   05/11/2021                          Expected End Date:  11/08/2021                     Follow Up Date 07/06/2021   - check blood sugar at prescribed times - check blood sugar if I feel it is too high or too low - enter blood sugar readings and medication or insulin into daily log - take the blood sugar log to all doctor visits - take the blood sugar meter to all doctor visits    Why is this important?   Checking your blood sugar at home helps to keep it from getting very high or very low.  Writing the results in a diary or log helps the doctor know how to care for you.  Your blood sugar log should have the time, date and the results.  Also, write down the amount of insulin or other medicine that you take.  Other information, like what you ate, exercise done and how you were feeling, will also be helpful.       Track and Manage Fluids and Swelling-Heart Failure       Timeframe:  Long-Range Goal Priority:  High Start Date:   05/11/2021                          Expected End Date:  11/08/2021                     Follow Up Date 07/06/2021   - call office if I gain more than 3 pounds in one day or 5 pounds in one week - track weight in diary - use salt in moderation - watch for swelling in feet, ankles and legs every day - weigh myself daily    Why is this important?   It is important to check your weight daily and watch how much salt and liquids you have.  It will help you to manage your heart failure.       Track and Manage My Blood Pressure-Hypertension/ CHF       Timeframe:  Long-Range Goal Priority:  High Start Date:   05/11/2021                          Expected  End Date:  11/08/2021                     Follow Up Date 07/06/2021   - check blood pressure daily - choose a place to take my blood pressure (home, clinic or office, retail store) - write blood pressure results in a log or diary    Why is this important?   You won't feel high blood pressure, but it can still hurt your blood vessels.  High blood pressure can cause heart or kidney problems. It can also cause a stroke.  Making  lifestyle changes like losing a little weight or eating less salt will help.  Checking your blood pressure at home and at different times of the day can help to control blood pressure.  If the doctor prescribes medicine remember to take it the way the doctor ordered.  Call the office if you cannot afford the medicine or if there are questions about it.          Consent to CCM Services: Larry [name] was given information about Chronic Care Management services including:  CCM service includes personalized support from designated clinical staff supervised by his physician, including individualized plan of care and coordination with other care providers 24/7 contact phone numbers for assistance for urgent and routine care needs. Service will only be billed when office clinical staff spend 20 minutes or more in a month to coordinate care. Only one practitioner may furnish and bill the service in a calendar month. The patient may stop CCM services at any time (effective at the end of the month) by phone call to the office staff. The patient will be responsible for cost sharing (co-pay) of up to 20% of the service fee (after annual deductible is met).  Patient agreed to services and verbal consent obtained.   Patient verbalizes understanding of instructions provided today and agrees to view in [place].   Telephone follow up appointment with care management team member scheduled for: 8 weeks The patient has been provided with contact information for the care management  team and has been advised to call with any health related questions or concerns.   Larry [name], PharmD Clinical Pharmacist, [place]    CLINICAL CARE PLAN: Patient Care Plan: CCM Care Plan     Problem Identified: HTN, CAD, HLD, CHF, DM2, Allergic Rhinitis   Priority: High  Onset Date: 05/11/2021     Long-Range Goal: Disease Management   Start Date: 05/11/2021  Expected End Date: 11/08/2021  This Visit's Progress: On track  Priority: High  Note:   Current Barriers:  Unable to independently monitor therapeutic efficacy  Pharmacist Clinical Goal(s):  Patient will achieve adherence to monitoring guidelines and medication adherence to achieve therapeutic efficacy achieve improvement in blood sugar as evidenced by blood sugars averaging <150 through collaboration with PharmD and provider.   Interventions: 1:1 collaboration with Plotnikov, [name], MD regarding development and update of comprehensive plan of care as evidenced by provider attestation and co-signature Inter-disciplinary care team collaboration (see longitudinal plan of care) Comprehensive medication review performed; medication list updated in electronic medical record  Hyperlipidemia/ Coronary Artery Disease : (LDL goal < 70) -Controlled Lab Results  Component Value Date   Akins 65 06/16/2020  -Current treatment: Simvastatin 40mg  - 1 tablet daily  Aspirin 325mg  - 1 tablet daily  Nitroglycerin 0.4mg  - 1 tablet every 5 minutes as needed for chest pain x 3 doses -Medications previously tried: n/a  -Current dietary patterns: reports to eating mostly home cooked meals, denies major indulgences in fried/ fatty foods  -Current exercise habits: walking, golfing  -Educated on Cholesterol goals;  Benefits of statin for ASCVD risk reduction; Importance of limiting foods high in cholesterol; Exercise goal of 150 minutes per week; -Counseled on diet and exercise extensively Recommended to continue  current medication  Diabetes (A1c goal <7%) -Not ideally controlled Lab Results  Component Value Date   HGBA1C 7.7 (H) 01/20/2021  -Current medications: Metformin 1000mg  - 1 tablet twice daily  Farxiga 10mg  - 1 tablet daily  - not currently  taking  Repaglinide 1mg  - 1 tablet 3 times daily before meals  -Medications previously tried: jardiance, glimepiride,   -Last eGFR 52 mL/min  -Current home glucose readings fasting glucose: reports that blood sugars have been running 150-160 -Denies hypoglycemic/hyperglycemic symptoms -Current meal patterns:  breakfast: cheerios and toast +/- fruit   lunch: does not typically eat - if he were to eat - peanutbutter and jelly with chips   dinner: home cooked - pork chops, chicken wings, green beans / corn / potatoes  snacks: peanuts, pretzels, chips, ice cream 1 x per week drinks: water / flavored water, diet coke on rare occasion  -Current exercise: walks / golfs on occasion  -Educated on A1c and blood sugar goals; Complications of diabetes including kidney damage, retinal damage, and cardiovascular disease; Exercise goal of 150 minutes per week; Benefits of weight loss; Benefits of routine self-monitoring of blood sugar; Carbohydrate counting and/or plate method -Counseled to check feet daily and get yearly eye exams -Counseled on diet and exercise extensively Recommended to continue current medication -Planning for patient to start with dietary changes, should BG remain elevated from goal, would recommend for patient to increase repaglinide to improve glycemic control  Heart Failure (Goal: manage symptoms and prevent exacerbations)/ Hypertension (BP goal <130/80) -Controlled -Last ejection fraction: 60-65% (Date: 08/02/2020) -HF type: Diastolic -NYHA Class: I (no actitivty limitation) -Current treatment: Hydralazine 25mg  - 1 tablet 3 times daily  Furosemide 40mg  - 1 tablet daily  Carvedilol 25mg  - 1 tablet twice daily  Farxiga 10mg  - 1  tablet daily - not currently taking  -Medications previously tried: Verapamil, Valsartan, Triamterene-HCTZ, telmisartan, spironolactone, metoprolol tartrate, metoprolol succinate, losartan, benazepril, HCTZ, amlodipine  -Current home BP/HR readings: 135-140/70 BP Readings from Last 3 Encounters:  03/08/21 138/60  01/23/21 132/60  01/20/21 124/68  -Current dietary habits: moderate sodium intake -Current exercise habits: walking, golfing -Educated on Benefits of medications for managing symptoms and prolonging life Importance of weighing daily; if you gain more than 3 pounds in one day or 5 pounds in one week, make office aware Proper diuretic administration and potassium supplementation Importance of blood pressure control -Counseled on diet and exercise extensively Recommended for patient to move furosemide administration to AM, and take an additional 1/2 tablet as needed in the PM for swelling   Allergic Rhinitis (Goal: Prevention/ Control of Allergies) -Controlled -Current treatment  Loratadine 10mg  - 1 tablet daily as needed -Medications previously tried: fluticasone, afrin nasal sprays  -Recommended to continue current medication  Health Maintenance -Vaccine gaps: COVID vaccine, shingles, and influenza  -Current therapy:  Folic Acid 1mg  - 1 tablet daily  Vitamin B12 1016mcg - 1 tablet daily  Last B12 level: 389 pg/mL (01/20/2021) Thiamine 100mg  - 1 tablet dialy  Macular Vitamin Benefit - 1 tablet daily  Vitamin D3 1000units- 1 tablet daily  Prevident 5000 Dry Mouth 1.1% gel - use once daily in place of toothpaste  -Educated on Cost vs benefit of each product must be carefully weighed by individual consumer -Patient is satisfied with current therapy and denies issues -Recommended to continue current medication  Patient Goals/Self-Care Activities Patient will:  - take medications as prescribed check glucose daily, document, and provide at future appointments check blood  pressure daily, document, and provide at future appointments weigh daily, and contact provider if weight gain of >3lbs in a day or >5lbs in a week target a minimum of 150 minutes of moderate intensity exercise weekly engage in dietary modifications by reducing carb intake / reducing  sodium intake  Follow Up Plan: Telephone follow up appointment with care management team member scheduled for: 8 weeks  The patient has been provided with contact information for the care management team and has been advised to call with any health related questions or concerns.

## 2021-05-13 ENCOUNTER — Ambulatory Visit (INDEPENDENT_AMBULATORY_CARE_PROVIDER_SITE_OTHER): Payer: Medicare Other | Admitting: *Deleted

## 2021-05-13 DIAGNOSIS — Z0000 Encounter for general adult medical examination without abnormal findings: Secondary | ICD-10-CM | POA: Diagnosis not present

## 2021-05-13 NOTE — Progress Notes (Addendum)
Subjective:   Larry [name] is a 80 y.o. male who presents for an Initial Medicare Annual Wellness Visit.  I connected with  [name] on 05/13/21 by audio enabled telemedicine application and verified that I am speaking with the correct person using two identifiers.   I discussed the limitations of evaluation and management by telemedicine. The patient expressed understanding and agreed to proceed.   Location of Patient: Home Location of Provider: Home Office Persons participating in visit: Larry [name] (patient) & Jari Favre, CMA  Review of Systems    Defer to PCP Cardiac Risk Factors include: none     Objective:    There were no vitals filed for this visit. There is no height or weight on file to calculate BMI.  Advanced Directives 05/13/2021 09/18/2019 09/08/2019 09/03/2019 01/24/2019 08/25/2018 12/09/2017  Does Patient Have a Medical Advance Directive? No Yes Yes Yes No No No  Type of Advance Directive - [place];Living will [place];Living will - - - -  Does patient want to make changes to medical advance directive? Yes (ED - Information included in AVS) - No - Patient declined - - - -  Copy of Healthcare Power of Attorney in Chart? - No - copy requested - - - - -  Would patient like information on creating a medical advance directive? - - - - No - Patient declined No - Patient declined -    Current Medications (verified) Outpatient Encounter Medications as of 05/13/2021  Medication Sig   Accu-Chek FastClix Lancets MISC USE 1  TO CHECK GLUCOSE TWICE DAILY (Patient taking differently: USE 1  TO CHECK GLUCOSE TWICE DAILY - Uses Relion system)   aspirin 325 MG tablet Take 1 tablet (325 mg total) by mouth daily.   carvedilol (COREG) 25 MG tablet Take 1 tablet (25 mg total) by mouth 2 (two) times daily.   Cholecalciferol (VITAMIN D3) 1000 UNITS CAPS Take 1,000 Units by mouth daily.   Cyanocobalamin (VITAMIN B-12) 1000 MCG SUBL Place 1 tablet  (1,000 mcg total) under the tongue daily.   folic acid (FOLVITE) 1 MG tablet Take 1 tablet by mouth once daily   furosemide (LASIX) 40 MG tablet Take one q am and one at hs prn swelling (Patient taking differently: Take one tablet in am and 1/2 tablet at hs prn swelling)   glucose blood (ACCU-CHEK SMARTVIEW) test strip USE 1 STRIP TO CHECK GLUCOSE TWICE DAILY E11.9 (Patient taking differently: USE 1 STRIP TO CHECK GLUCOSE TWICE DAILY E11.9 - Uses Relion system)   glucose blood (ACCU-CHEK SMARTVIEW) test strip USE 1 STRIP TO CHECK GLUCOSE  DAILY (Patient taking differently: USE 1 STRIP TO CHECK GLUCOSE  DAILY - Uses Relion system)   hydrALAZINE (APRESOLINE) 25 MG tablet TAKE 1 TABLET BY MOUTH THREE TIMES DAILY   loratadine (CLARITIN) 10 MG tablet Take 1 tablet (10 mg total) by mouth daily. Use for 1 month then prn   metFORMIN (GLUCOPHAGE) 1000 MG tablet TAKE 1 TABLET BY MOUTH TWICE DAILY WITH A MEAL   Multiple Vitamins-Minerals (MACULAR VITAMIN BENEFIT) TABS Take 1 tablet by mouth 2 (two) times daily.   nitroGLYCERIN (NITROSTAT) 0.4 MG SL tablet Place 1 tablet (0.4 mg total) under the tongue every 5 (five) minutes as needed for chest pain.   PREVIDENT 5000 DRY MOUTH 1.1 % GEL dental gel USE ONCE DAILY IN PLACE OF REGULAR TOOTHPASTE.   repaglinide (PRANDIN) 1 MG tablet Take 1 tablet (1 mg total) by mouth  3 (three) times daily before meals.   simvastatin (ZOCOR) 40 MG tablet Take 1 tablet (40 mg total) by mouth every evening.   thiamine 100 MG tablet Take 1 tablet (100 mg total) by mouth daily.   No facility-administered encounter medications on file as of 05/13/2021.    Allergies (verified) Amlodipine, Verapamil, Clindamycin hcl, and Contrast media [iodinated diagnostic agents]   History: Past Medical History:  Diagnosis Date   Carotid stenosis, left    Cataracts, bilateral    removed   Coronary artery disease    Hyperlipemia    Prostate CA (HCC)    Sleep apnea    Dr Ledora Bottcher ; uses CPAP    Stroke ([place])    Type II or unspecified type diabetes mellitus without mention of complication, not stated as uncontrolled    Unspecified essential hypertension    Past Surgical History:  Procedure Laterality Date   APPENDECTOMY     CARDIAC CATHETERIZATION     CATARACT EXTRACTION, BILATERAL     CORONARY ARTERY BYPASS GRAFT N/A 06/27/2017   Procedure: CORONARY ARTERY BYPASS GRAFTING (CABG), time four   using the left internal mammary artery, and right saphenous leg vein harvested endoscopically.;  Surgeon: Ivin Poot, MD;  Location: [place];  Service: Open Heart Surgery;  Laterality: N/A;   CYSTOSCOPY N/A 06/27/2017   Procedure: CYSTOSCOPY FLEXIBLE, Balloon dilatation placement of foley catheter;  Surgeon: Ivin Poot, MD;  Location: [place];  Service: Open Heart Surgery;  Laterality: N/A;   ENDARTERECTOMY Left 08/22/2017   Procedure: ENDARTERECTOMY CAROTID LEFT;  Surgeon: Serafina Mitchell, MD;  Location: [facility];  Service: Vascular;  Laterality: Left;   INTRAVASCULAR ULTRASOUND/IVUS N/A 06/24/2017   Procedure: Intravascular Ultrasound/IVUS;  Surgeon: Belva Crome, MD;  Location: [place] [facility];  Service: Cardiovascular;  Laterality: N/A;  LEFT MAIN   LEFT HEART CATH AND CORONARY ANGIOGRAPHY N/A 06/24/2017   Procedure: LEFT HEART CATH AND CORONARY ANGIOGRAPHY;  Surgeon: Belva Crome, MD;  Location: [place] [facility];  Service: Cardiovascular;  Laterality: N/A;   PATCH ANGIOPLASTY Left 08/22/2017   Procedure: PATCH ANGIOPLASTY USING Rueben Bash BIOLOGIC PATCH;  Surgeon: Serafina Mitchell, MD;  Location: [place] OR;  Service: Vascular;  Laterality: Left;   PROSTATECTOMY     TEE WITHOUT CARDIOVERSION N/A 06/27/2017   Procedure: TRANSESOPHAGEAL ECHOCARDIOGRAM (TEE);  Surgeon: Prescott Gum, Collier Salina, MD;  Location: [place];  Service: Open Heart Surgery;  Laterality: N/A;   TONSILLECTOMY     ULTRASOUND GUIDANCE FOR VASCULAR ACCESS  06/24/2017   Procedure: Ultrasound Guidance For Vascular Access;  Surgeon:  Belva Crome, MD;  Location: [place] [facility];  Service: Cardiovascular;;   Family History  Problem Relation Age of Onset   Heart disease Mother    Diabetes Other    Hypertension Other    Colon cancer Neg Hx    Social History   Socioeconomic History   Marital status: Married    Spouse name: Not on file   Number of children: Not on file   Years of education: Not on file   Highest education level: Bachelor's degree (e.g., BA, AB, BS)  Occupational History   Occupation: Salesman  Tobacco Use   Smoking status: Never   Smokeless tobacco: Never  Vaping Use   Vaping Use: Never used  Substance and Sexual Activity   Alcohol use: No    Alcohol/week: 0.0 standard drinks   Drug use: No   Sexual activity: Not on file  Other Topics  Concern   Not on file  Social History Narrative   Regular Exercise- no, golf   Social Determinants of Health   Financial Resource Strain: Low Risk    Difficulty of Paying Living Expenses: Not hard at all  Food Insecurity: No Food Insecurity   Worried About Charity fundraiser in the Last Year: Never true   Ran Out of Food in the Last Year: Never true  Transportation Needs: No Transportation Needs   Lack of Transportation (Medical): No   Lack of Transportation (Non-Medical): No  Physical Activity: Insufficiently Active   Days of Exercise per Week: 2 days   Minutes of Exercise per Session: 30 min  Stress: No Stress Concern Present   Feeling of Stress : Not at all  Social Connections: Moderately Integrated   Frequency of Communication with Friends and Family: More than three times a week   Frequency of Social Gatherings with Friends and Family: Once a week   Attends Religious Services: More than 4 times per year   Active Member of [employer] or Organizations: No   Attends Archivist Meetings: Never   Marital Status: Married    Tobacco Counseling Counseling given: Not Answered   Clinical Intake:     Pain : No/denies pain     BMI  - recorded: 32.57 Nutritional Status: BMI > 30  Obese Nutritional Risks: Other (Comment) Diabetes: Yes CBG done?: No Did pt. bring in CBG monitor from home?: No  How often do you need to have someone help you when you read instructions, pamphlets, or other written materials from your doctor or pharmacy?: 1 - Never  Diabetic? Yes  Interpreter Needed?: No      Activities of Daily Living In your present state of health, do you have any difficulty performing the following activities: 05/13/2021  Hearing? N  Vision? N  Difficulty concentrating or making decisions? N  Walking or climbing stairs? N  Dressing or bathing? N  Doing errands, shopping? N  Preparing Food and eating ? N  Using the Toilet? N  In the past six months, have you accidently leaked urine? Y  Comment Seeing someone for a leaking bladder  Do you have problems with loss of bowel control? N  Managing your Medications? N  Managing your Finances? N  Housekeeping or managing your Housekeeping? N  Some recent data might be hidden    Patient Care Team: Plotnikov, [name], MD as PCP - General (Internal Medicine) Belva Crome, MD as PCP - Cardiology (Cardiology) Sueanne Margarita, MD as PCP - Sleep Medicine (Cardiology) Kathie Rhodes, MD (Inactive) as Consulting Physician (Urology) Kristeen Miss, MD as Consulting Physician (Neurosurgery) Szabat, Darnelle Maffucci, [facility] as Pharmacist (Pharmacist)  Indicate any recent Medical Services you may have received from other than [facility] providers in the past year (date may be approximate).     Assessment:   This is a routine wellness examination for Laurent.  Hearing/Vision screen No results found.  Dietary issues and exercise activities discussed: Current Exercise Habits: Home exercise routine, Type of exercise: walking, Time (Minutes): 30, Frequency (Times/Week): 2, Weekly Exercise (Minutes/Week): 60, Intensity: Mild, Exercise limited by: None identified   Goals Addressed    None    Depression Screen PHQ 2/9 Scores 05/13/2021 09/15/2020 03/24/2019 03/10/2018 12/16/2017 08/29/2017 01/09/2016  PHQ - 2 Score 0 0 0 0 0 0 1    Fall Risk Fall Risk  05/13/2021 01/20/2021 09/15/2020 03/24/2019 12/10/2017  Falls in the past year? 0 0 0  0 No  Number falls in past yr: 0 0 0 0 -  Injury with Fall? 0 0 0 0 -  Risk for fall due to : - - No Fall Risks - Other (Comment)  Risk for fall due to: Comment - - - - Balance screen: 3 questions answered yes.     FALL RISK PREVENTION PERTAINING TO THE HOME:  Any stairs in or around the home? Yes  If so, are there any without handrails? Yes  Home free of loose throw rugs in walkways, pet beds, electrical cords, etc? Yes  Adequate lighting in your home to reduce risk of falls? Yes   ASSISTIVE DEVICES UTILIZED TO PREVENT FALLS:  Life alert? No Use of a cane, walker or w/c? No  Grab bars in the bathroom? No  Shower chair or bench in shower? No  Elevated toilet seat or a handicapped toilet? No   TIMED UP AND GO:  Was the test performed?  n/a .  Length of time to ambulate 10 feet: n/a sec.     Cognitive Function:     6CIT Screen 05/13/2021  What Year? 0 points  What month? 0 points  What time? 0 points  Count back from 20 0 points  Months in reverse 0 points  Repeat phrase 0 points  Total Score 0    Immunizations Immunization History  Administered Date(s) Administered   Fluad Quad(high Dose 65+) 06/24/2019, 06/15/2020   Influenza Split 07/13/2011   Influenza Whole 05/30/2006, 05/27/2008, 07/19/2009, 08/24/2010   Influenza, High Dose Seasonal PF 08/07/2016, 06/18/2017, 06/25/2018   Influenza, Seasonal, Injecte, Preservative Fre 08/27/2012   Influenza,inj,Quad PF,6+ Mos 04/29/2013, 04/07/2014   Pneumococcal Conjugate-13 07/13/2019   Pneumococcal Polysaccharide-23 07/13/2011   Tdap 09/10/2019    TDAP status: Up to date  Flu Vaccine status: Due, Education has been provided regarding the importance of this vaccine.  Advised may receive this vaccine at local pharmacy or Health Dept. Aware to provide a copy of the vaccination record if obtained from local pharmacy or Health Dept. Verbalized acceptance and understanding.  Pneumococcal vaccine status: Up to date  Covid-19 vaccine status: Declined, Education has been provided regarding the importance of this vaccine but patient still declined. Advised may receive this vaccine at local pharmacy or Health Dept.or vaccine clinic. Aware to provide a copy of the vaccination record if obtained from local pharmacy or Health Dept. Verbalized acceptance and understanding.  Qualifies for Shingles Vaccine? Yes   Zostavax completed No   Shingrix Completed?: No.    Education has been provided regarding the importance of this vaccine. Patient has been advised to call insurance company to determine out of pocket expense if they have not yet received this vaccine. Advised may also receive vaccine at local pharmacy or Health Dept. Verbalized acceptance and understanding.  Screening Tests Health Maintenance  Topic Date Due   COLONOSCOPY (Pts 45-57yrs Insurance coverage will need to be confirmed)  05/20/2021 (Originally 03/14/2021)   COVID-19 Vaccine (1) 05/29/2021 (Originally 05/11/1941)   Zoster Vaccines- Shingrix (1 of 2) 08/12/2021 (Originally 11/09/1991)   INFLUENZA VACCINE  11/17/2021 (Originally 03/20/2021)   Hepatitis C Screening  01/20/2022 (Originally 11/09/1958)   HEMOGLOBIN A1C  07/22/2021   FOOT EXAM  01/20/2022   OPHTHALMOLOGY EXAM  04/06/2022   TETANUS/TDAP  09/09/2029   HPV VACCINES  Aged Out    Health Maintenance  There are no preventive care reminders to display for this patient.   Colorectal cancer screening: No longer required.   Lung Cancer  Screening: (Low Dose CT Chest recommended if Age 26-80 years, 30 pack-year currently smoking OR have quit w/in 15years.) does not qualify.     Additional Screening:  Hepatitis C Screening: does not qualify; Not  Completed  Vision Screening: Recommended annual ophthalmology exams for early detection of glaucoma and other disorders of the eye. Is the patient up to date with their annual eye exam?  Yes  Who is the provider or what is the name of the office in which the patient attends annual eye exams? Dr. Herbert Deaner If pt is not established with a provider, would they like to be referred to a provider to establish care? No .   Dental Screening: Recommended annual dental exams for proper oral hygiene  Community Resource Referral / Chronic Care Management: CRR required this visit?  No   CCM required this visit?  No      Plan:     I have personally reviewed and noted the following in the patient's chart:   Medical and social history Use of alcohol, tobacco or illicit drugs  Current medications and supplements including opioid prescriptions. Patient is not currently taking opioid prescriptions. Functional ability and status Nutritional status Physical activity Advanced directives List of other physicians Hospitalizations, surgeries, and ER visits in previous 12 months Vitals Screenings to include cognitive, depression, and falls Referrals and appointments  In addition, I have reviewed and discussed with patient certain preventive protocols, quality metrics, and best practice recommendations. A written personalized care plan for preventive services as well as general preventive health recommendations were provided to patient.     Cannon Kettle, [place]   05/13/2021   Nurse Notes: 13 minutes non face to face   Larry [name] , Thank you for taking time to come for your Medicare Wellness Visit. I appreciate your ongoing commitment to your health goals. Please review the following plan we discussed and let me know if I can assist you in the future.   These are the goals we discussed:  Goals      Monitor and Manage My Blood Sugar-Diabetes Type 2     Timeframe:  Long-Range Goal Priority:   High Start Date:   05/11/2021                          Expected End Date:  11/08/2021                     Follow Up Date 07/06/2021   - check blood sugar at prescribed times - check blood sugar if I feel it is too high or too low - enter blood sugar readings and medication or insulin into daily log - take the blood sugar log to all doctor visits - take the blood sugar meter to all doctor visits    Why is this important?   Checking your blood sugar at home helps to keep it from getting very high or very low.  Writing the results in a diary or log helps the doctor know how to care for you.  Your blood sugar log should have the time, date and the results.  Also, write down the amount of insulin or other medicine that you take.  Other information, like what you ate, exercise done and how you were feeling, will also be helpful.       Track and Manage Fluids and Swelling-Heart Failure     Timeframe:  Long-Range Goal Priority:  High Start Date:  05/11/2021                          Expected End Date:  11/08/2021                     Follow Up Date 07/06/2021   - call office if I gain more than 3 pounds in one day or 5 pounds in one week - track weight in diary - use salt in moderation - watch for swelling in feet, ankles and legs every day - weigh myself daily    Why is this important?   It is important to check your weight daily and watch how much salt and liquids you have.  It will help you to manage your heart failure.       Track and Manage My Blood Pressure-Hypertension/ CHF     Timeframe:  Long-Range Goal Priority:  High Start Date:   05/11/2021                          Expected End Date:  11/08/2021                     Follow Up Date 07/06/2021   - check blood pressure daily - choose a place to take my blood pressure (home, clinic or office, retail store) - write blood pressure results in a log or diary    Why is this important?   You won't feel high blood pressure, but it  can still hurt your blood vessels.  High blood pressure can cause heart or kidney problems. It can also cause a stroke.  Making lifestyle changes like losing a little weight or eating less salt will help.  Checking your blood pressure at home and at different times of the day can help to control blood pressure.  If the doctor prescribes medicine remember to take it the way the doctor ordered.  Call the office if you cannot afford the medicine or if there are questions about it.          This is a list of the screening recommended for you and due dates:  Health Maintenance  Topic Date Due   Colon Cancer Screening  05/20/2021*   COVID-19 Vaccine (1) 05/29/2021*   Zoster (Shingles) Vaccine (1 of 2) 08/12/2021*   Flu Shot  11/17/2021*   Hepatitis C Screening: USPSTF Recommendation to screen - Ages 18-79 yo.  01/20/2022*   Hemoglobin A1C  07/22/2021   Complete foot exam   01/20/2022   Eye exam for diabetics  04/06/2022   Tetanus Vaccine  09/09/2029   HPV Vaccine  Aged Out  *Topic was postponed. The date shown is not the original due date.     Medical screening examination/treatment/procedure(s) were performed by non-physician practitioner and as supervising physician I was immediately available for consultation/collaboration.  I agree with above. Lew Dawes, MD

## 2021-05-13 NOTE — Patient Instructions (Signed)
Health Maintenance, Male Adopting a healthy lifestyle and getting preventive care are important in promoting health and wellness. Ask your health care provider about: The right schedule for you to have regular tests and exams. Things you can do on your own to prevent diseases and keep yourself healthy. What should I know about diet, weight, and exercise? Eat a healthy diet  Eat a diet that includes plenty of vegetables, fruits, low-fat dairy products, and lean protein. Do not eat a lot of foods that are high in solid fats, added sugars, or sodium. Maintain a healthy weight Body mass index (BMI) is a measurement that can be used to identify possible weight problems. It estimates body fat based on height and weight. Your health care provider can help determine your BMI and help you achieve or maintain a healthy weight. Get regular exercise Get regular exercise. This is one of the most important things you can do for your health. Most adults should: Exercise for at least 150 minutes each week. The exercise should increase your heart rate and make you sweat (moderate-intensity exercise). Do strengthening exercises at least twice a week. This is in addition to the moderate-intensity exercise. Spend less time sitting. Even light physical activity can be beneficial. Watch cholesterol and blood lipids Have your blood tested for lipids and cholesterol at 79 years of age, then have this test every 5 years. You may need to have your cholesterol levels checked more often if: Your lipid or cholesterol levels are high. You are older than 79 years of age. You are at high risk for heart disease. What should I know about cancer screening? Many types of cancers can be detected early and may often be prevented. Depending on your health history and family history, you may need to have cancer screening at various ages. This may include screening for: Colorectal cancer. Prostate cancer. Skin cancer. Lung  cancer. What should I know about heart disease, diabetes, and high blood pressure? Blood pressure and heart disease High blood pressure causes heart disease and increases the risk of stroke. This is more likely to develop in people who have high blood pressure readings, are of African descent, or are overweight. Talk with your health care provider about your target blood pressure readings. Have your blood pressure checked: Every 3-5 years if you are 18-39 years of age. Every year if you are 40 years old or older. If you are between the ages of 65 and 75 and are a current or former smoker, ask your health care provider if you should have a one-time screening for abdominal aortic aneurysm (AAA). Diabetes Have regular diabetes screenings. This checks your fasting blood sugar level. Have the screening done: Once every three years after age 45 if you are at a normal weight and have a low risk for diabetes. More often and at a younger age if you are overweight or have a high risk for diabetes. What should I know about preventing infection? Hepatitis B If you have a higher risk for hepatitis B, you should be screened for this virus. Talk with your health care provider to find out if you are at risk for hepatitis B infection. Hepatitis C Blood testing is recommended for: Everyone born from 1945 through 1965. Anyone with known risk factors for hepatitis C. Sexually transmitted infections (STIs) You should be screened each year for STIs, including gonorrhea and chlamydia, if: You are sexually active and are younger than 79 years of age. You are older than 79 years   of age and your health care provider tells you that you are at risk for this type of infection. Your sexual activity has changed since you were last screened, and you are at increased risk for chlamydia or gonorrhea. Ask your health care provider if you are at risk. Ask your health care provider about whether you are at high risk for HIV.  Your health care provider may recommend a prescription medicine to help prevent HIV infection. If you choose to take medicine to prevent HIV, you should first get tested for HIV. You should then be tested every 3 months for as long as you are taking the medicine. Follow these instructions at home: Lifestyle Do not use any products that contain nicotine or tobacco, such as cigarettes, e-cigarettes, and chewing tobacco. If you need help quitting, ask your health care provider. Do not use street drugs. Do not share needles. Ask your health care provider for help if you need support or information about quitting drugs. Alcohol use Do not drink alcohol if your health care provider tells you not to drink. If you drink alcohol: Limit how much you have to 0-2 drinks a day. Be aware of how much alcohol is in your drink. In the [place] one drink equals one 12 oz bottle of beer (355 mL), one 5 oz glass of wine (148 mL), or one 1 oz glass of hard liquor (44 mL). General instructions Schedule regular health, dental, and eye exams. Stay current with your vaccines. Tell your health care provider if: You often feel depressed. You have ever been abused or do not feel safe at home. Summary Adopting a healthy lifestyle and getting preventive care are important in promoting health and wellness. Follow your health care provider's instructions about healthy diet, exercising, and getting tested or screened for diseases. Follow your health care provider's instructions on monitoring your cholesterol and blood pressure. This information is not intended to replace advice given to you by your health care provider. Make sure you discuss any questions you have with your health care provider. Document Revised: 10/14/2020 Document Reviewed: 07/30/2018 Elsevier Patient Education  2022 [employer] Inc.  

## 2021-05-19 DIAGNOSIS — E785 Hyperlipidemia, unspecified: Secondary | ICD-10-CM | POA: Diagnosis not present

## 2021-05-19 DIAGNOSIS — E1159 Type 2 diabetes mellitus with other circulatory complications: Secondary | ICD-10-CM | POA: Diagnosis not present

## 2021-05-19 DIAGNOSIS — I10 Essential (primary) hypertension: Secondary | ICD-10-CM | POA: Diagnosis not present

## 2021-05-19 DIAGNOSIS — I25709 Atherosclerosis of coronary artery bypass graft(s), unspecified, with unspecified angina pectoris: Secondary | ICD-10-CM | POA: Diagnosis not present

## 2021-05-22 ENCOUNTER — Encounter: Payer: Self-pay | Admitting: Internal Medicine

## 2021-05-22 ENCOUNTER — Ambulatory Visit (INDEPENDENT_AMBULATORY_CARE_PROVIDER_SITE_OTHER): Payer: Medicare Other | Admitting: Internal Medicine

## 2021-05-22 ENCOUNTER — Other Ambulatory Visit: Payer: Self-pay

## 2021-05-22 VITALS — BP 120/62 | HR 56 | Temp 97.8°F | Resp 18 | Ht 73.0 in | Wt 238.6 lb

## 2021-05-22 DIAGNOSIS — N3945 Continuous leakage: Secondary | ICD-10-CM

## 2021-05-22 DIAGNOSIS — Z23 Encounter for immunization: Secondary | ICD-10-CM

## 2021-05-22 DIAGNOSIS — E1159 Type 2 diabetes mellitus with other circulatory complications: Secondary | ICD-10-CM

## 2021-05-22 DIAGNOSIS — E662 Morbid (severe) obesity with alveolar hypoventilation: Secondary | ICD-10-CM | POA: Diagnosis not present

## 2021-05-22 DIAGNOSIS — E538 Deficiency of other specified B group vitamins: Secondary | ICD-10-CM

## 2021-05-22 DIAGNOSIS — I10 Essential (primary) hypertension: Secondary | ICD-10-CM | POA: Diagnosis not present

## 2021-05-22 DIAGNOSIS — E785 Hyperlipidemia, unspecified: Secondary | ICD-10-CM

## 2021-05-22 DIAGNOSIS — N183 Chronic kidney disease, stage 3 unspecified: Secondary | ICD-10-CM

## 2021-05-22 DIAGNOSIS — Z86010 Personal history of colonic polyps: Secondary | ICD-10-CM | POA: Diagnosis not present

## 2021-05-22 DIAGNOSIS — I6523 Occlusion and stenosis of bilateral carotid arteries: Secondary | ICD-10-CM

## 2021-05-22 DIAGNOSIS — R32 Unspecified urinary incontinence: Secondary | ICD-10-CM | POA: Insufficient documentation

## 2021-05-22 LAB — COMPREHENSIVE METABOLIC PANEL
ALT: 14 U/L (ref 0–53)
AST: 15 U/L (ref 0–37)
Albumin: 4.2 g/dL (ref 3.5–5.2)
Alkaline Phosphatase: 67 U/L (ref 39–117)
BUN: 28 mg/dL — ABNORMAL HIGH (ref 6–23)
CO2: 31 mEq/L (ref 19–32)
Calcium: 9.7 mg/dL (ref 8.4–10.5)
Chloride: 100 mEq/L (ref 96–112)
Creatinine, Ser: 1.38 mg/dL (ref 0.40–1.50)
GFR: 48.63 mL/min — ABNORMAL LOW (ref >60.00)
Glucose, Bld: 176 mg/dL — ABNORMAL HIGH (ref 70–99)
Potassium: 3.7 mEq/L (ref 3.5–5.1)
Sodium: 141 mEq/L (ref 135–145)
Total Bilirubin: 0.6 mg/dL (ref 0.2–1.2)
Total Protein: 6.9 g/dL (ref 6.0–8.3)

## 2021-05-22 LAB — HEMOGLOBIN A1C: Hgb A1c MFr Bld: 7 % — ABNORMAL HIGH (ref 4.6–6.5)

## 2021-05-22 NOTE — Assessment & Plan Note (Signed)
Larry [name] is planning to re-try [place]

## 2021-05-22 NOTE — Assessment & Plan Note (Signed)
[name] is planning to have surgery

## 2021-05-22 NOTE — Progress Notes (Signed)
Subjective:  Patient ID: Larry [name], male    DOB: [birth year]  Age: 80 y.o. MRN: [id number]  CC: 4 month f/u (No concerns. )   HPI Larry [name] presents for DM, HTN, CAD f/u. Pt lost wt on diet F/u on polyps  Outpatient Medications Prior to Visit  Medication Sig Dispense Refill   Accu-Chek FastClix Lancets MISC USE 1  TO CHECK GLUCOSE TWICE DAILY (Patient taking differently: USE 1  TO CHECK GLUCOSE TWICE DAILY - Uses Relion system) 100 each 11   aspirin 325 MG tablet Take 1 tablet (325 mg total) by mouth daily. 60 tablet 0   carvedilol (COREG) 25 MG tablet Take 1 tablet (25 mg total) by mouth 2 (two) times daily. 180 tablet 3   Cholecalciferol (VITAMIN D3) 1000 UNITS CAPS Take 1,000 Units by mouth daily.     Cyanocobalamin (VITAMIN B-12) 1000 MCG SUBL Place 1 tablet (1,000 mcg total) under the tongue daily. 570 tablet 3   folic acid (FOLVITE) 1 MG tablet Take 1 tablet by mouth once daily 90 tablet 3   furosemide (LASIX) 40 MG tablet Take one q am and one at hs prn swelling (Patient taking differently: Take one tablet in am and 1/2 tablet at hs prn swelling) 135 tablet 3   glucose blood (ACCU-CHEK SMARTVIEW) test strip USE 1 STRIP TO CHECK GLUCOSE TWICE DAILY E11.9 (Patient taking differently: USE 1 STRIP TO CHECK GLUCOSE TWICE DAILY E11.9 - Uses Relion system) 180 each 3   glucose blood (ACCU-CHEK SMARTVIEW) test strip USE 1 STRIP TO CHECK GLUCOSE  DAILY (Patient taking differently: USE 1 STRIP TO CHECK GLUCOSE  DAILY - Uses Relion system) 90 each 3   hydrALAZINE (APRESOLINE) 25 MG tablet TAKE 1 TABLET BY MOUTH THREE TIMES DAILY 270 tablet 0   loratadine (CLARITIN) 10 MG tablet Take 1 tablet (10 mg total) by mouth daily. Use for 1 month then prn 100 tablet 3   metFORMIN (GLUCOPHAGE) 1000 MG tablet TAKE 1 TABLET BY MOUTH TWICE DAILY WITH A MEAL 180 tablet 1   Multiple Vitamins-Minerals (MACULAR VITAMIN BENEFIT) TABS Take 1 tablet by mouth 2 (two) times daily.     nitroGLYCERIN  (NITROSTAT) 0.4 MG SL tablet Place 1 tablet (0.4 mg total) under the tongue every 5 (five) minutes as needed for chest pain. 20 tablet 3   PREVIDENT 5000 DRY MOUTH 1.1 % GEL dental gel USE ONCE DAILY IN PLACE OF REGULAR TOOTHPASTE.     repaglinide (PRANDIN) 1 MG tablet Take 1 tablet (1 mg total) by mouth 3 (three) times daily before meals. 270 tablet 1   simvastatin (ZOCOR) 40 MG tablet Take 1 tablet (40 mg total) by mouth every evening. 90 tablet 3   thiamine 100 MG tablet Take 1 tablet (100 mg total) by mouth daily. 90 tablet 3   No facility-administered medications prior to visit.    ROS: Review of Systems  Constitutional:  Positive for fatigue. Negative for appetite change and unexpected weight change.  HENT:  Negative for congestion, nosebleeds, sneezing, sore throat and trouble swallowing.   Eyes:  Negative for itching and visual disturbance.  Respiratory:  Negative for cough.   Cardiovascular:  Positive for leg swelling. Negative for chest pain and palpitations.  Gastrointestinal:  Negative for abdominal distention, blood in stool, diarrhea and nausea.  Genitourinary:  Negative for frequency and hematuria.  Musculoskeletal:  Positive for arthralgias and gait problem. Negative for back pain, joint swelling and neck pain.  Skin:  Negative for rash.  Neurological:  Negative for dizziness, tremors, speech difficulty and weakness.  Psychiatric/Behavioral:  Negative for agitation, dysphoric mood and sleep disturbance. The patient is not nervous/anxious.    Objective:  BP 120/62   Pulse (!) 56   Temp 97.8 F (36.6 C) (Oral)   Resp 18   Ht 6\' 1"  (1.854 m)   Wt 238 lb 9.6 oz (108.2 kg)   SpO2 98%   BMI 31.48 kg/m   BP Readings from Last 3 Encounters:  05/22/21 120/62  03/08/21 138/60  01/23/21 132/60    Wt Readings from Last 3 Encounters:  05/22/21 238 lb 9.6 oz (108.2 kg)  03/08/21 246 lb 12.8 oz (111.9 kg)  01/23/21 246 lb (111.6 kg)    Physical Exam Constitutional:       General: He is not in acute distress.    Appearance: He is well-developed. He is obese.     Comments: NAD  Eyes:     Conjunctiva/sclera: Conjunctivae normal.     Pupils: Pupils are equal, round, and reactive to light.  Neck:     Thyroid: No thyromegaly.     Vascular: No JVD.  Cardiovascular:     Rate and Rhythm: Normal rate and regular rhythm.     Heart sounds: Normal heart sounds. No murmur heard.   No friction rub. No gallop.  Pulmonary:     Effort: Pulmonary effort is normal. No respiratory distress.     Breath sounds: Normal breath sounds. No wheezing or rales.  Chest:     Chest wall: No tenderness.  Abdominal:     General: Bowel sounds are normal. There is no distension.     Palpations: Abdomen is soft. There is no mass.     Tenderness: There is no abdominal tenderness. There is no guarding or rebound.  Musculoskeletal:        General: Tenderness present. Normal range of motion.     Cervical back: Normal range of motion.  Lymphadenopathy:     Cervical: No cervical adenopathy.  Skin:    General: Skin is warm and dry.     Findings: No rash.  Neurological:     Mental Status: He is alert and oriented to person, place, and time.     Cranial Nerves: No cranial nerve deficit.     Motor: No abnormal muscle tone.     Coordination: Coordination normal.     Gait: Gait normal.     Deep Tendon Reflexes: Reflexes are normal and symmetric.  Psychiatric:        Behavior: Behavior normal.        Thought Content: Thought content normal.        Judgment: Judgment normal.  B edema 1+   Lab Results  Component Value Date   WBC 9.4 06/16/2020   HGB 13.1 06/16/2020   HCT 39.4 06/16/2020   PLT 240.0 06/16/2020   GLUCOSE 216 (H) 02/07/2021   CHOL 125 06/16/2020   TRIG 99.0 06/16/2020   HDL 41.10 06/16/2020   LDLCALC 64 06/16/2020   ALT 12 01/20/2021   AST 24 01/20/2021   NA 141 02/07/2021   K 4.6 02/07/2021   CL 104 02/07/2021   CREATININE 1.39 (H) 02/07/2021   BUN 29 (H)  02/07/2021   CO2 22 02/07/2021   TSH 2.28 01/20/2021   PSA 0.00 Repeated and verified X2. (L) 12/26/2012   INR 1.0 11/19/2018   HGBA1C 7.7 (H) 01/20/2021    MR Lumbar Spine Wo Contrast  Result Date: 09/28/2019 CLINICAL DATA:  80 year old male with low back pain radiating down the left leg since July. Symptoms after lawnmower accident. EXAM: MRI LUMBAR SPINE WITHOUT CONTRAST TECHNIQUE: Multiplanar, multisequence MR imaging of the lumbar spine was performed. No intravenous contrast was administered. COMPARISON:  Lumbar MRI 04/04/2016. Abdominal radiograph 11/20/2018. FINDINGS: Segmentation: Transitional anatomy. For the purposes of this report the same numbering system will be used as on the 2017 radiographs which designates a partially lumbarized S1 level with full size S1-S2 disc space. By this numbering system there are hypoplastic ribs at L1 (series 13 image 1). Correlation with radiographs is recommended prior to any operative intervention. Alignment: Stable lumbar lordosis since 2017. Mild retrolisthesis of L2 on L3, L3 on L4, L5 on S1. Vertebrae: No marrow edema or evidence of acute osseous abnormality. Visualized bone marrow signal is within normal limits. Intact visible sacrum and SI joints. Conus medullaris and cauda equina: Conus extends to the T12-L1 level, minimally visible and negative. Paraspinal and other soft tissues: Negative. Disc levels: T12-L1:  Negative. L1-L2: Mild posterior disc bulge or protrusion is stable without stenosis. Mild left facet hypertrophy. L2-L3: Chronic circumferential disc bulge with broad-based posterior and foraminal components. Mild facet hypertrophy. Mild disc progression since 2017 along with new mild spinal stenosis. No convincing lateral recess or foraminal stenosis. L3-L4: Disc desiccation with circumferential disc bulge. Chronic posterior annular fissure. Moderate posterior element hypertrophy and mild epidural lipomatosis. This level has progressed since 2017  with moderate spinal stenosis. Mild left lateral recess stenosis. Moderate left and mild right L3 foraminal stenosis. L4-L5: Mild circumferential disc bulge and moderate posterior element hypertrophy appears stable. No significant spinal or lateral recess stenosis. There is chronic moderate to severe left L4 foraminal stenosis on series 5, image 11. L5-S1: Circumferential disc bulge with mild posterior element hypertrophy. No spinal or lateral recess stenosis. Mild to moderate left and mild right L5 foraminal stenosis is stable. S1-S2: Full size disc space. Mostly sacralized. Probable left side assimilation joint. No stenosis. IMPRESSION: 1. 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 Correlation with radiographs is recommended prior to any operative intervention. 2. 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 Moderate multifactorial spinal stenosis at the latter with mild left lateral recess and moderate left foraminal stenosis. Query left L3 and/or L4 radiculitis. 3. Superimposed chronic moderate to severe left L4 foraminal stenosis also at L4-L5. Up to moderate chronic left L5 foraminal stenosis. Electronically Signed   By: Genevie Ann M.D.   On: 09/28/2019 07:29    Assessment & Plan:   Problem List Items Addressed This Visit     Chronic renal insufficiency, stage 3 (moderate) (HCC)    Larry [name] is planning to re-try [place]      Relevant Orders   Comprehensive metabolic panel   DM2 (diabetes mellitus, type 2) ([place])    Cont w/Metformin, Glimepiride  Larry [name] is planning to re-try Farxiga      Relevant Orders   Comprehensive metabolic panel   Hemoglobin A1c   Dyslipidemia    On Simvastatin      Essential hypertension    Larry [name] is planning to re-try [place]      History of colon polyps    Will ref to see Dr Fuller Plan Due colon in 2022 (last in 2017 - x2 polyps)      Relevant Orders   Ambulatory referral to Gastroenterology   Low  serum vitamin B12    On  B12      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  Walker Kehr, MD

## 2021-05-22 NOTE — Assessment & Plan Note (Signed)
On Simvastatin 

## 2021-05-22 NOTE — Addendum Note (Signed)
Addended by: Jacobo Forest on: 05/22/2021 10:24 AM   Modules accepted: Orders

## 2021-05-22 NOTE — Assessment & Plan Note (Signed)
Larry [name]  Lost wt Larry [name] is planning to re-try [place]

## 2021-05-22 NOTE — Assessment & Plan Note (Signed)
On B12 

## 2021-05-22 NOTE — Assessment & Plan Note (Addendum)
Will ref to see Dr Fuller Plan Due colon in 2022 (last in 2017 - x2 polyps)

## 2021-05-22 NOTE — Assessment & Plan Note (Signed)
Cont w/Metformin, Glimepiride  Larry [name] is planning to re-try [place]

## 2021-05-22 NOTE — Addendum Note (Signed)
Addended by: Thomes Cake on: 05/22/2021 10:21 AM   Modules accepted: Orders

## 2021-05-23 ENCOUNTER — Encounter: Payer: Self-pay | Admitting: Gastroenterology

## 2021-06-27 ENCOUNTER — Other Ambulatory Visit: Payer: Self-pay | Admitting: Internal Medicine

## 2021-07-06 ENCOUNTER — Ambulatory Visit (INDEPENDENT_AMBULATORY_CARE_PROVIDER_SITE_OTHER): Payer: Medicare Other | Admitting: Gastroenterology

## 2021-07-06 ENCOUNTER — Ambulatory Visit (INDEPENDENT_AMBULATORY_CARE_PROVIDER_SITE_OTHER): Payer: Medicare Other

## 2021-07-06 ENCOUNTER — Encounter: Payer: Self-pay | Admitting: Gastroenterology

## 2021-07-06 ENCOUNTER — Other Ambulatory Visit: Payer: Self-pay

## 2021-07-06 VITALS — BP 156/70 | HR 68 | Ht 71.0 in | Wt 245.4 lb

## 2021-07-06 DIAGNOSIS — Z86010 Personal history of colonic polyps: Secondary | ICD-10-CM | POA: Diagnosis not present

## 2021-07-06 DIAGNOSIS — I6523 Occlusion and stenosis of bilateral carotid arteries: Secondary | ICD-10-CM

## 2021-07-06 DIAGNOSIS — N183 Chronic kidney disease, stage 3 unspecified: Secondary | ICD-10-CM

## 2021-07-06 DIAGNOSIS — I25709 Atherosclerosis of coronary artery bypass graft(s), unspecified, with unspecified angina pectoris: Secondary | ICD-10-CM

## 2021-07-06 DIAGNOSIS — E1159 Type 2 diabetes mellitus with other circulatory complications: Secondary | ICD-10-CM

## 2021-07-06 DIAGNOSIS — I10 Essential (primary) hypertension: Secondary | ICD-10-CM

## 2021-07-06 MED ORDER — PLENVU 140 G PO SOLR: 1.0000 | Freq: Once | ORAL | 0 refills | Status: AC

## 2021-07-06 NOTE — Patient Instructions (Signed)
Visit Information  Thank you for taking time to visit with me today. Please don't hesitate to contact me if I can be of assistance to you before our next scheduled telephone appointment.  Telephone follow up appointment with care management team member scheduled for: The patient has been provided with contact information for the care management team and has been advised to call with any health related questions or concerns.   If you need to cancel or re-schedule our visit, please call [phone] and our care guide team will be happy to assist you.  Following is a list of the goals we discussed today:   Track and Manage My Blood Pressure - HTN/CHF  Timeframe:  Long-Range Goal Priority:  High Start Date:   05/11/2021                          Expected End Date:  07/11/2022                     Follow Up Date March 2023   - check blood pressure daily - choose a place to take my blood pressure (home, clinic or office, retail store) - write blood pressure results in a log or diary    Why is this important?   You won't feel high blood pressure, but it can still hurt your blood vessels.  High blood pressure can cause heart or kidney problems. It can also cause a stroke.  Making lifestyle changes like losing a little weight or eating less salt will help.  Checking your blood pressure at home and at different times of the day can help to control blood pressure.  If the doctor prescribes medicine remember to take it the way the doctor ordered  Track and Manage Fluids/ Swelling - CHF Timeframe:  Long-Range Goal Priority:  High Start Date:   05/11/2021                          Expected End Date:  07/11/2022                     Follow Up Date March 2023   - call office if I gain more than 3 pounds in one day or 5 pounds in one week - track weight in diary - use salt in moderation - watch for swelling in feet, ankles and legs every day - weigh myself daily    Why is this important?   It  is important to check your weight daily and watch how much salt and liquids you have.  It will help you to manage your heart failure.   Track and Manage My Blood Sugars - DM2  Timeframe:  Long-Range Goal Priority:  High Start Date:   05/11/2021                          Expected End Date:  07/11/2022                     Follow Up Date March 2023   - check blood sugar at prescribed times - check blood sugar if I feel it is too high or too low - enter blood sugar readings and medication or insulin into daily log - take the blood sugar log to all doctor visits - take the blood sugar meter to all doctor visits  Why is this important?   Checking your blood sugar at home helps to keep it from getting very high or very low.  Writing the results in a diary or log helps the doctor know how to care for you.  Your blood sugar log should have the time, date and the results.  Also, write down the amount of insulin or other medicine that you take.  Other information, like what you ate, exercise done and how you were feeling, will also be helpful.    Patient verbalizes understanding of instructions provided today and agrees to view in [place].   Tomasa Blase, PharmD Clinical Pharmacist, [place]

## 2021-07-06 NOTE — Progress Notes (Signed)
History of Present Illness: This is a 80 year old male referred by Plotnikov, [name], MD for the evaluation of personal history of colon polyps.  2 colon polyps removed at colonoscopy 5 years ago as below, tubular adenoma and a sessile serrated polyp.  He is in overall very good health and desires at least 1 more colonoscopy. Denies weight loss, abdominal pain, constipation, diarrhea, change in stool caliber, melena, hematochezia, nausea, vomiting, dysphagia, reflux symptoms, chest pain.   Colonoscopy 02/2016 - One 7 mm polyp in the ascending colon, removed with a cold snare. Resected and retrieved. - Internal hemorrhoids. - One 4 mm polyp in the transverse colon, removed with a cold biopsy forceps. Resected and retrieved. Path: TA, SSP   Allergies  Allergen Reactions   Amlodipine Swelling    Leg swelling   Verapamil Swelling    Edema w/high dose   Clindamycin Hcl     UNSPECIFIED REACTION    Farxiga [Dapagliflozin]     Penile swelling, leakage   Contrast Media [Iodinated Diagnostic Agents] Nausea And Vomiting   Outpatient Medications Prior to Visit  Medication Sig Dispense Refill   Accu-Chek FastClix Lancets MISC USE 1  TO CHECK GLUCOSE TWICE DAILY (Patient taking differently: USE 1  TO CHECK GLUCOSE TWICE DAILY - Uses Relion system) 100 each 11   aspirin 325 MG tablet Take 1 tablet (325 mg total) by mouth daily. 60 tablet 0   carvedilol (COREG) 25 MG tablet Take 1 tablet (25 mg total) by mouth 2 (two) times daily. 180 tablet 3   Cholecalciferol (VITAMIN D3) 1000 UNITS CAPS Take 1,000 Units by mouth daily.     Cyanocobalamin (VITAMIN B-12) 1000 MCG SUBL Place 1 tablet (1,000 mcg total) under the tongue daily. 250 tablet 3   folic acid (FOLVITE) 1 MG tablet Take 1 tablet by mouth once daily 90 tablet 3   furosemide (LASIX) 40 MG tablet Take one q am and one at hs prn swelling (Patient taking differently: Take one tablet in am and 1/2 tablet at hs prn swelling) 135 tablet 3    glucose blood (ACCU-CHEK SMARTVIEW) test strip USE 1 STRIP TO CHECK GLUCOSE TWICE DAILY E11.9 (Patient taking differently: USE 1 STRIP TO CHECK GLUCOSE TWICE DAILY E11.9 - Uses Relion system) 180 each 3   glucose blood (ACCU-CHEK SMARTVIEW) test strip USE 1 STRIP TO CHECK GLUCOSE  DAILY (Patient taking differently: USE 1 STRIP TO CHECK GLUCOSE  DAILY - Uses Relion system) 90 each 3   hydrALAZINE (APRESOLINE) 25 MG tablet TAKE 1 TABLET BY MOUTH THREE TIMES DAILY 270 tablet 0   loratadine (CLARITIN) 10 MG tablet Take 1 tablet (10 mg total) by mouth daily. Use for 1 month then prn 100 tablet 3   metFORMIN (GLUCOPHAGE) 1000 MG tablet TAKE 1 TABLET BY MOUTH TWICE DAILY WITH A MEAL 180 tablet 1   Multiple Vitamins-Minerals (MACULAR VITAMIN BENEFIT) TABS Take 1 tablet by mouth 2 (two) times daily.     PREVIDENT 5000 DRY MOUTH 1.1 % GEL dental gel USE ONCE DAILY IN PLACE OF REGULAR TOOTHPASTE.     repaglinide (PRANDIN) 1 MG tablet TAKE 1 TABLET BY MOUTH THREE TIMES DAILY WITH MEALS 270 tablet 3   simvastatin (ZOCOR) 40 MG tablet TAKE 1 TABLET BY MOUTH ONCE DAILY IN THE EVENING 90 tablet 3   thiamine 100 MG tablet Take 1 tablet (100 mg total) by mouth daily. 90 tablet 3   nitroGLYCERIN (NITROSTAT) 0.4 MG SL tablet Place 1  tablet (0.4 mg total) under the tongue every 5 (five) minutes as needed for chest pain. (Patient not taking: Reported on 07/06/2021) 20 tablet 3   No facility-administered medications prior to visit.   Past Medical History:  Diagnosis Date   Carotid stenosis, left    Cataracts, bilateral    removed   Coronary artery disease    Diabetes (HCC)    Heart valve problem    Hyperlipemia    Prostate CA ([place])    Sleep apnea    Dr Ledora Bottcher ; uses CPAP   Stroke [facility])    Tubular adenoma of colon 02/2016   Type II or unspecified type diabetes mellitus without mention of complication, not stated as uncontrolled    Unspecified essential hypertension    Past Surgical History:  Procedure  Laterality Date   APPENDECTOMY     CARDIAC CATHETERIZATION     CATARACT EXTRACTION, BILATERAL     CORONARY ARTERY BYPASS GRAFT N/A 06/27/2017   Procedure: CORONARY ARTERY BYPASS GRAFTING (CABG), time four   using the left internal mammary artery, and right saphenous leg vein harvested endoscopically.;  Surgeon: Ivin Poot, MD;  Location: [place];  Service: Open Heart Surgery;  Laterality: N/A;   CYSTOSCOPY N/A 06/27/2017   Procedure: CYSTOSCOPY FLEXIBLE, Balloon dilatation placement of foley catheter;  Surgeon: Ivin Poot, MD;  Location: [place];  Service: Open Heart Surgery;  Laterality: N/A;   ENDARTERECTOMY Left 08/22/2017   Procedure: ENDARTERECTOMY CAROTID LEFT;  Surgeon: Serafina Mitchell, MD;  Location: [facility] OR;  Service: Vascular;  Laterality: Left;   INGUINAL HERNIA REPAIR Left    INTRAVASCULAR ULTRASOUND/IVUS N/A 06/24/2017   Procedure: Intravascular Ultrasound/IVUS;  Surgeon: Belva Crome, MD;  Location: [place] [facility];  Service: Cardiovascular;  Laterality: N/A;  LEFT MAIN   LEFT HEART CATH AND CORONARY ANGIOGRAPHY N/A 06/24/2017   Procedure: LEFT HEART CATH AND CORONARY ANGIOGRAPHY;  Surgeon: Belva Crome, MD;  Location: [place] [facility];  Service: Cardiovascular;  Laterality: N/A;   PATCH ANGIOPLASTY Left 08/22/2017   Procedure: PATCH ANGIOPLASTY USING Rueben Bash BIOLOGIC PATCH;  Surgeon: Serafina Mitchell, MD;  Location: [place] OR;  Service: Vascular;  Laterality: Left;   PROSTATECTOMY     TEE WITHOUT CARDIOVERSION N/A 06/27/2017   Procedure: TRANSESOPHAGEAL ECHOCARDIOGRAM (TEE);  Surgeon: Prescott Gum, Collier Salina, MD;  Location: [place];  Service: Open Heart Surgery;  Laterality: N/A;   TONSILLECTOMY     ULTRASOUND GUIDANCE FOR VASCULAR ACCESS  06/24/2017   Procedure: Ultrasound Guidance For Vascular Access;  Surgeon: Belva Crome, MD;  Location: [place] [facility];  Service: Cardiovascular;;   Social History   Socioeconomic History   Marital status: Married    Spouse  name: Not on file   Number of children: 4   Years of education: Not on file   Highest education level: Bachelor's degree (e.g., BA, AB, BS)  Occupational History   Occupation: Salesman    Comment: retired  Tobacco Use   Smoking status: Never   Smokeless tobacco: Never  Vaping Use   Vaping Use: Never used  Substance and Sexual Activity   Alcohol use: No    Alcohol/week: 0.0 standard drinks   Drug use: No   Sexual activity: Not on file  Other Topics Concern   Not on file  Social History Narrative   Regular Exercise- no, golf   Social Determinants of Health   Financial Resource Strain: Low Risk    Difficulty of Paying Living Expenses:  Not hard at all  Food Insecurity: No Food Insecurity   Worried About Charity fundraiser in the Last Year: Never true   Ran Out of Food in the Last Year: Never true  Transportation Needs: No Transportation Needs   Lack of Transportation (Medical): No   Lack of Transportation (Non-Medical): No  Physical Activity: Insufficiently Active   Days of Exercise per Week: 2 days   Minutes of Exercise per Session: 30 min  Stress: No Stress Concern Present   Feeling of Stress : Not at all  Social Connections: Moderately Integrated   Frequency of Communication with Friends and Family: More than three times a week   Frequency of Social Gatherings with Friends and Family: Once a week   Attends Religious Services: More than 4 times per year   Active Member of [employer] or Organizations: No   Attends Music therapist: Never   Marital Status: Married   Family History  Problem Relation Age of Onset   Heart disease Mother    Heart disease Father    Heart attack Brother    Diabetes Other    Hypertension Other    Colon cancer Neg Hx        Review of Systems: Pertinent positive and negative review of systems were noted in the above HPI section. All other review of systems were otherwise negative.   Physical Exam: General: Well developed, well  nourished, no acute distress Head: Normocephalic and atraumatic Eyes: Sclerae anicteric, EOMI Ears: Normal auditory acuity Mouth: Not examined, mask on during Covid-19 pandemic Neck: Supple, no masses or thyromegaly Lungs: Clear throughout to auscultation Heart: Regular rate and rhythm; no murmurs, rubs or bruits Abdomen: Soft, non tender and non distended. No masses, hepatosplenomegaly or hernias noted. Normal Bowel sounds Rectal: Not done Musculoskeletal: Symmetrical with no gross deformities  Skin: No lesions on visible extremities Pulses:  Normal pulses noted Extremities: No clubbing, cyanosis, edema or deformities noted Neurological: Alert oriented x 4, grossly nonfocal Cervical Nodes:  No significant cervical adenopathy Inguinal Nodes: No significant inguinal adenopathy Psychological:  Alert and cooperative. Normal mood and affect   Assessment and Recommendations:  Personal history of colon polyps in 2017, tubular adenoma and sessile serrated polyp.  He is in overall good health and wishes to proceed with 1 more colonoscopy.  Schedule colonoscopy. The risks (including bleeding, perforation, infection, missed lesions, medication reactions and possible hospitalization or surgery if complications occur), benefits, and alternatives to colonoscopy with possible biopsy and possible polypectomy were discussed with the patient and they consent to proceed.     cc: Plotnikov, [name], MD [address] [place],  [place] [address]

## 2021-07-06 NOTE — Progress Notes (Addendum)
Chronic Care Management Pharmacy Note  07/06/2021 Name:  Larry [name] MRN:  [id number] DOB:  28-Dec-1940  Summary: -Patient reports that he continues farxiga hold at this time, continues clotrimazole and hydrocortisone cream for treatment of yeast infection (prescribed by urology)- has plans to restart once he feels he is ready to restart / discussed with urology -Met with gastro today, colonoscopy scheduled  -Has seen dermatology - has been using fluorouracil 5% cream, recently had developed redness on his face after use, holding x 1 week then will restart per derm - f/u in January  -Taking furosemide once daily in the afternoon, reports no current issues with swelling  -Continues to monitor BP and BG at home, BP averaging 130/70's typically, BG averaging 130-140 if he is watching his diet, if he has snacked the night before it averages 180-190, A1c has improved to 7.0% since last check  Recommendations/Changes made from today's visit: -Recommending for patient to continue hold of farxiga until yeast infection has cleared - patient to retrial once it has been cleared by urologist -Reviewed carb counting and dietary changes that can help improve BG to goal / sodium reduction that can reduce swelling in legs and lower blood pressure -Advised for patient to avoid excessive sunlight when using fluorouracil cream in an effort to reduce risk of reaction  -Patient to continue to monitor BP and BG at home and will make clinic aware should either become uncontrolled   Subjective: Larry [name] is an 80 y.o. year old male who is a primary patient of Plotnikov, Georgina Quint, MD.  The CCM team was consulted for assistance with disease management and care coordination needs.    Engaged with patient by telephone for follow up visit in response to provider referral for pharmacy case management and/or care coordination services.   Consent to Services:  The patient was given the following information  about Chronic Care Management services today, agreed to services, and gave verbal consent: 1. CCM service includes personalized support from designated clinical staff supervised by the primary care provider, including individualized plan of care and coordination with other care providers 2. 24/7 contact phone numbers for assistance for urgent and routine care needs. 3. Service will only be billed when office clinical staff spend 20 minutes or more in a month to coordinate care. 4. Only one practitioner may furnish and bill the service in a calendar month. 5.The patient may stop CCM services at any time (effective at the end of the month) by phone call to the office staff. 6. The patient will be responsible for cost sharing (co-pay) of up to 20% of the service fee (after annual deductible is met). Patient agreed to services and consent obtained.  Patient Care Team: Plotnikov, Georgina Quint, MD as PCP - General (Internal Medicine) Lyn Records, MD as PCP - Cardiology (Cardiology) Quintella Reichert, MD as PCP - Sleep Medicine (Cardiology) Ihor Gully, MD (Inactive) as Consulting Physician (Urology) Barnett Abu, MD as Consulting Physician (Neurosurgery) Ellin Saba, [facility] as Pharmacist (Pharmacist)  Recent office visits:  05/22/2021 - Dr. Posey Rea - to follow up with gastro - plan is for patient to restart farxiga once yeast infection has cleared    Recent consult visits:  07/06/2021 Laurette Schimke - scheduled for colonoscopy (hx of colon polyps in 2017)   Hospital visits:  None in previous 6 months  Objective:  Lab Results  Component Value Date   CREATININE 1.38 05/22/2021   BUN 28 (H) 05/22/2021  GFR 48.63 (L) 05/22/2021   GFRNONAA 44 (L) 11/23/2018   GFRAA 51 (L) 11/23/2018   NA 141 05/22/2021   K 3.7 05/22/2021   CALCIUM 9.7 05/22/2021   CO2 31 05/22/2021   GLUCOSE 176 (H) 05/22/2021    Lab Results  Component Value Date/Time   HGBA1C 7.0 (H) 05/22/2021 10:24 AM   HGBA1C 7.7 (H)  01/20/2021 10:22 AM   GFR 48.63 (L) 05/22/2021 10:24 AM   GFR 55.42 (L) 01/20/2021 10:22 AM    Last diabetic Eye exam:  Lab Results  Component Value Date/Time   HMDIABEYEEXA No Retinopathy 04/06/2021 12:00 AM    Last diabetic Foot exam:  No results found for: HMDIABFOOTEX   Lab Results  Component Value Date   CHOL 125 06/16/2020   HDL 41.10 06/16/2020   LDLCALC 64 06/16/2020   TRIG 99.0 06/16/2020   CHOLHDL 3 06/16/2020    Hepatic Function Latest Ref Rng & Units 05/22/2021 01/20/2021 09/15/2020  Total Protein 6.0 - 8.3 g/dL 6.9 6.5 6.8  Albumin 3.5 - 5.2 g/dL 4.2 4.0 4.0  AST 0 - 37 U/L $Remo'15 24 18  'AvGaB$ ALT 0 - 53 U/L $Remo'14 12 14  'AZIUG$ Alk Phosphatase 39 - 117 U/L 67 65 68  Total Bilirubin 0.2 - 1.2 mg/dL 0.6 0.5 0.5  Bilirubin, Direct 0.0 - 0.3 mg/dL - - -    Lab Results  Component Value Date/Time   TSH 2.28 01/20/2021 10:22 AM   TSH 2.62 06/16/2020 10:19 AM   FREET4 0.81 01/20/2021 10:22 AM   FREET4 0.87 06/16/2020 10:19 AM    CBC Latest Ref Rng & Units 06/16/2020 06/24/2019 01/28/2019  WBC 4.0 - 10.5 K/uL 9.4 9.2 10.4  Hemoglobin 13.0 - 17.0 g/dL 13.1 12.1(L) 10.7(L)  Hematocrit 39.0 - 52.0 % 39.4 37.0(L) 32.3(L)  Platelets 150.0 - 400.0 K/uL 240.0 236.0 239.0    No results found for: VD25OH  Clinical ASCVD: Yes  The ASCVD Risk score (Arnett DK, et al., 2019) failed to calculate for the following reasons:   The patient has a prior MI or stroke diagnosis    Depression screen [facility] 2/9 05/13/2021 09/15/2020 03/24/2019  Decreased Interest 0 0 0  Down, Depressed, Hopeless 0 0 0  PHQ - 2 Score 0 0 0  Some recent data might be hidden    Social History   Tobacco Use  Smoking Status Never  Smokeless Tobacco Never   BP Readings from Last 3 Encounters:  07/06/21 (!) 156/70  05/22/21 120/62  03/08/21 138/60   Pulse Readings from Last 3 Encounters:  07/06/21 68  05/22/21 (!) 56  03/08/21 65   Wt Readings from Last 3 Encounters:  07/06/21 245 lb 6 oz (111.3 kg)  05/22/21 238  lb 9.6 oz (108.2 kg)  03/08/21 246 lb 12.8 oz (111.9 kg)   BMI Readings from Last 3 Encounters:  07/06/21 34.22 kg/m  05/22/21 31.48 kg/m  03/08/21 32.56 kg/m    Assessment/Interventions: Review of patient past medical history, allergies, medications, health status, including review of consultants reports, laboratory and other test data, was performed as part of comprehensive evaluation and provision of chronic care management services.   SDOH:  (Social Determinants of Health) assessments and interventions performed: Yes  SDOH Screenings   Alcohol Screen: Low Risk    Last Alcohol Screening Score (AUDIT): 0  Depression (PHQ2-9): Low Risk    PHQ-2 Score: 0  Financial Resource Strain: Low Risk    Difficulty of Paying Living Expenses: Not hard at all  Food Insecurity: No Food Insecurity   Worried About Charity fundraiser in the Last Year: Never true   Ran Out of Food in the Last Year: Never true  Housing: Low Risk    Last Housing Risk Score: 0  Physical Activity: Insufficiently Active   Days of Exercise per Week: 2 days   Minutes of Exercise per Session: 30 min  Social Connections: Moderately Integrated   Frequency of Communication with Friends and Family: More than three times a week   Frequency of Social Gatherings with Friends and Family: Once a week   Attends Religious Services: More than 4 times per year   Active Member of [employer] or Organizations: No   Attends Music therapist: Never   Marital Status: Married  Stress: No Stress Concern Present   Feeling of Stress : Not at all  Tobacco Use: Low Risk    Smoking Tobacco Use: Never   Smokeless Tobacco Use: Never   Passive Exposure: Not on file  Transportation Needs: No Transportation Needs   Lack of Transportation (Medical): No   Lack of Transportation (Non-Medical): No    CCM Care Plan  Allergies  Allergen Reactions   Amlodipine Swelling    Leg swelling   Verapamil Swelling    Edema w/high dose    Clindamycin Hcl     UNSPECIFIED REACTION    Farxiga [Dapagliflozin]     Penile swelling, leakage   Contrast Media [Iodinated Diagnostic Agents] Nausea And Vomiting    Medications Reviewed Today     Reviewed by Tomasa Blase, [facility] (Pharmacist) on 07/06/21 at [place] List Status: <None>   Medication Order Taking? Sig Documenting Provider Last Dose Status Informant  Accu-Chek FastClix Lancets MISC [id number] Yes USE 1  TO CHECK GLUCOSE TWICE DAILY  Patient taking differently: USE 1  TO CHECK GLUCOSE TWICE DAILY - Uses Relion system   Plotnikov, [name], MD Taking Active   aspirin 325 MG tablet [id number] Yes Take 1 tablet (325 mg total) by mouth daily. Desiree Hane, MD Taking Active   carvedilol (COREG) 25 MG tablet [id number] Yes Take 1 tablet (25 mg total) by mouth 2 (two) times daily. Plotnikov, [name], MD Taking Active   Cholecalciferol (VITAMIN D3) 1000 UNITS CAPS [id number] Yes Take 1,000 Units by mouth daily. [provider] Taking Active Spouse/Significant Other  clotrimazole (LOTRIMIN) 1 % cream [id number] Yes Apply 1 application topically 2 (two) times daily. [provider] Taking Active   Cyanocobalamin (VITAMIN B-12) 1000 MCG SUBL [id number] Yes Place 1 tablet (1,000 mcg total) under the tongue daily. Plotnikov, [name], MD Taking Active   fluorouracil (EFUDEX) 5 % cream [id number] Yes Apply topically 2 (two) times daily. [provider] Taking Active   folic acid (FOLVITE) 1 MG tablet [id number] Yes Take 1 tablet by mouth once daily Plotnikov, [name], MD Taking Active   furosemide (LASIX) 40 MG tablet [id number] Yes Take one q am and one at hs prn swelling  Patient taking differently: Take 40 mg by mouth daily.   Plotnikov, [name], MD Taking Active   glucose blood (ACCU-CHEK SMARTVIEW) test strip [id number] Yes USE 1 STRIP TO CHECK GLUCOSE TWICE DAILY E11.9  Patient taking differently: USE 1 STRIP TO CHECK GLUCOSE TWICE DAILY E11.9 - Uses  Relion system   Plotnikov, [name], MD Taking Active   glucose blood (ACCU-CHEK SMARTVIEW) test strip [id number] Yes USE 1 STRIP TO CHECK GLUCOSE  DAILY  Patient taking differently: USE  1 STRIP TO CHECK GLUCOSE  DAILY - Uses Relion system   Plotnikov, [name], MD Taking Active   hydrALAZINE (APRESOLINE) 25 MG tablet [id number] Yes TAKE 1 TABLET BY MOUTH THREE TIMES DAILY Plotnikov, [name], MD Taking Active   hydrocortisone cream 1 % [id number] Yes Apply 1 application topically 2 (two) times daily. [provider] Taking Active   loratadine (CLARITIN) 10 MG tablet [id number] Yes Take 1 tablet (10 mg total) by mouth daily. Use for 1 month then prn Plotnikov, [name], MD Taking Active   metFORMIN (GLUCOPHAGE) 1000 MG tablet [id number] Yes TAKE 1 TABLET BY MOUTH TWICE DAILY WITH A MEAL Plotnikov, [name], MD Taking Active   Multiple Vitamins-Minerals (MACULAR VITAMIN BENEFIT) TABS [id number] Yes Take 1 tablet by mouth 2 (two) times daily. [provider] Taking Active Spouse/Significant Other  nitroGLYCERIN (NITROSTAT) 0.4 MG SL tablet [id number] Yes Place 1 tablet (0.4 mg total) under the tongue every 5 (five) minutes as needed for chest pain. Plotnikov, [name], MD Taking Active            Med Note Rosana Hoes, SOPHIA A   Thu Jul 06, 2021  2:05 PM) On hand  PEG-KCl-NaCl-NaSulf-Na Asc-C (PLENVU) 140 g SOLR [id number] Yes Take 1 kit by mouth once for 1 dose. Ladene Artist, MD Taking Active   PREVIDENT 5000 DRY MOUTH 1.1 % GEL dental gel [id number] Yes USE ONCE DAILY IN PLACE OF REGULAR TOOTHPASTE. [provider] Taking Active   repaglinide (PRANDIN) 1 MG tablet [id number] Yes TAKE 1 TABLET BY MOUTH THREE TIMES DAILY WITH MEALS Plotnikov, [name], MD Taking Active   simvastatin (ZOCOR) 40 MG tablet [id number] Yes TAKE 1 TABLET BY MOUTH ONCE DAILY IN THE EVENING Plotnikov, [name], MD Taking Active   thiamine 100 MG tablet [id number] Yes Take 1 tablet (100 mg total) by mouth  daily. Plotnikov, [name], MD Taking Active   Med List Note Merceda Elks, CPhT 06/20/17 [id number]): Pt has CPAP Machine            Patient Active Problem List   Diagnosis Date Noted   Urinary incontinence 05/22/2021   History of colon polyps 05/22/2021   Hearing loss 09/27/2020   Otitis media 09/27/2020   Low serum vitamin B12 06/20/2020   Insomnia 01/25/2020   Chronic renal insufficiency, stage 3 (moderate) ([place]) 01/25/2020   Falls 09/10/2019   Leg weakness 09/10/2019   TIA (transient ischemic attack) 06/24/2019   Hip pain, acute, left 03/24/2019   Morbid (severe) obesity with alveolar hypoventilation ([place]) 01/13/2019   Cerebral embolism with cerebral infarction 11/20/2018   Close exposure to COVID-19 virus 11/19/2018   Fatigue 03/24/2018   Chronic combined systolic and diastolic heart failure ([place]) 08/25/2017   Carotid stenosis 08/22/2017   Coronary artery disease involving coronary bypass graft of native heart with angina pectoris ([place]) 06/27/2017   DOE (dyspnea on exertion) 06/18/2017   Acute encephalopathy 11/21/2016   Sciatic radiculitis 03/23/2016   Edema 04/23/2012   OBSTRUCTIVE SLEEP APNEA 10/23/2010   LOW BACK PAIN, ACUTE 07/19/2009   DM2 (diabetes mellitus, type 2) ([place]) 06/24/2007   Dyslipidemia 06/24/2007   Essential hypertension 06/24/2007   PROSTATE CANCER, HX OF 06/24/2007    Immunization History  Administered Date(s) Administered   Fluad Quad(high Dose 65+) 06/24/2019, 06/15/2020, 05/22/2021   Influenza Split 07/13/2011   Influenza Whole 05/30/2006, 05/27/2008, 07/19/2009, 08/24/2010   Influenza, High Dose Seasonal PF 08/07/2016, 06/18/2017, 06/25/2018   Influenza, Seasonal, Injecte, Preservative Fre 08/27/2012  Influenza,inj,Quad PF,6+ Mos 04/29/2013, 04/07/2014   Pneumococcal Conjugate-13 07/13/2019   Pneumococcal Polysaccharide-23 07/13/2011   Tdap 09/10/2019    Conditions to be addressed/monitored:  Hypertension, Hyperlipidemia,  Diabetes, Heart Failure, and Allergic Rhinitis  Care Plan : CCM Care Plan  Updates made by Tomasa Blase, RPH since 07/06/2021 12:00 AM     Problem: HTN, CAD, HLD, CHF, DM2, Allergic Rhinitis   Priority: High  Onset Date: 05/11/2021     Long-Range Goal: Disease Management   Start Date: 05/11/2021  Expected End Date: 11/08/2021  This Visit's Progress: On track  Recent Progress: On track  Priority: High  Note:   Current Barriers:  Unable to independently monitor therapeutic efficacy  Pharmacist Clinical Goal(s):  Patient will achieve adherence to monitoring guidelines and medication adherence to achieve therapeutic efficacy achieve improvement in blood sugar as evidenced by blood sugars averaging <150 through collaboration with PharmD and provider.   Interventions: 1:1 collaboration with Plotnikov, [name], MD regarding development and update of comprehensive plan of care as evidenced by provider attestation and co-signature Inter-disciplinary care team collaboration (see longitudinal plan of care) Comprehensive medication review performed; medication list updated in electronic medical record  Hyperlipidemia/ Coronary Artery Disease : (LDL goal < 70) -Controlled Lab Results  Component Value Date   [place] 64 06/16/2020  -Current treatment: Simvastatin 40mg  - 1 tablet daily  Aspirin 325mg  - 1 tablet daily  Nitroglycerin 0.4mg  - 1 tablet every 5 minutes as needed for chest pain x 3 doses -Medications previously tried: n/a  -Current dietary patterns: reports to eating mostly home cooked meals, denies major indulgences in fried/ fatty foods  -Current exercise habits: walking, golfing  -Educated on Cholesterol goals;  Benefits of statin for ASCVD risk reduction; Importance of limiting foods high in cholesterol; Exercise goal of 150 minutes per week; -Counseled on diet and exercise extensively Recommended to continue current medication  Diabetes (A1c goal <7%) -Not ideally  controlled Lab Results  Component Value Date   HGBA1C 7.0 (H) 05/22/2021  -Current medications: Metformin 1000mg  - 1 tablet twice daily  Farxiga 10mg  - 1 tablet daily  - not currently taking - will discuss with urology about restarting  Repaglinide 1mg  - 1 tablet 3 times daily before meals  -Medications previously tried: jardiance, glimepiride,   -Last eGFR 48.63 mL/min  -Current home glucose readings fasting glucose: reports that blood sugars have been running 130-140 if he is cognizant of his diet, can run 180-190 if he has indulged in sweets the night prior  -Denies hypoglycemic/hyperglycemic symptoms -Current meal patterns:  breakfast: cheerios and toast +/- fruit   lunch: does not typically eat - if he were to eat - peanutbutter and jelly with chips   dinner: home cooked - pork chops, chicken wings, green beans / corn / potatoes  snacks: peanuts, pretzels, chips, ice cream 1 x per week drinks: water / flavored water, diet coke on rare occasion  -Current exercise: walks / golfs on occasion  -Educated on A1c and blood sugar goals; Complications of diabetes including kidney damage, retinal damage, and cardiovascular disease; Exercise goal of 150 minutes per week; Benefits of weight loss; Benefits of routine self-monitoring of blood sugar; Carbohydrate counting and/or plate method -Counseled to check feet daily and get yearly eye exams -Counseled on diet and exercise extensively Recommended to continue current medication -Planning for patient to start with dietary changes, should BG remain elevated from goal, would recommend for patient to increase repaglinide to improve glycemic control  Heart Failure (  Goal: manage symptoms and prevent exacerbations)/ Hypertension (BP goal <130/80) -Controlled -Last ejection fraction: 60-65% (Date: 08/02/2020) -HF type: Diastolic -NYHA Class: I (no actitivty limitation) -Current treatment: Hydralazine 25mg  - 1 tablet 3 times daily  Furosemide  40mg  - 1 tablet daily  Carvedilol 25mg  - 1 tablet twice daily  Farxiga 10mg  - 1 tablet daily - not currently taking  -Medications previously tried: Verapamil, Valsartan, Triamterene-HCTZ, telmisartan, spironolactone, metoprolol tartrate, metoprolol succinate, losartan, benazepril, HCTZ, amlodipine  -Current home BP/HR readings: 130/70 BP Readings from Last 3 Encounters:  07/06/21 (!) 156/70  05/22/21 120/62  03/08/21 138/60  -Current dietary habits: moderate sodium intake -Current exercise habits: walking, golfing -Educated on Benefits of medications for managing symptoms and prolonging life Importance of weighing daily; if you gain more than 3 pounds in one day or 5 pounds in one week, make office aware Proper diuretic administration and potassium supplementation Importance of blood pressure control -Counseled on diet and exercise extensively Recommended for patient to continue current medications  - monitor for swelling, should swelling occur patient to increase furosemide to BID dosing   Allergic Rhinitis (Goal: Prevention/ Control of Allergies) -Controlled -Current treatment  Loratadine 10mg  - 1 tablet daily as needed -Medications previously tried: fluticasone, afrin nasal sprays  -Recommended to continue current medication  Health Maintenance -Vaccine gaps: COVID vaccine, shingles, and influenza  -Current therapy:  Folic Acid 1mg  - 1 tablet daily  Vitamin B12 1019mcg - 1 tablet daily  Last B12 level: 389 pg/mL (01/20/2021) Thiamine 100mg  - 1 tablet dialy  Macular Vitamin Benefit - 1 tablet daily  Vitamin D3 1000units- 1 tablet daily  Prevident 5000 Dry Mouth 1.1% gel - use once daily in place of toothpaste  -Educated on Cost vs benefit of each product must be carefully weighed by individual consumer -Patient is satisfied with current therapy and denies issues -Recommended to continue current medication  Patient Goals/Self-Care Activities Patient will:  - take medications  as prescribed check glucose daily, document, and provide at future appointments check blood pressure daily, document, and provide at future appointments weigh daily, and contact provider if weight gain of >3lbs in a day or >5lbs in a week target a minimum of 150 minutes of moderate intensity exercise weekly engage in dietary modifications by reducing carb intake / reducing sodium intake  Follow Up Plan: Telephone follow up appointment with care management team member scheduled for: 3 months The patient has been provided with contact information for the care management team and has been advised to call with any health related questions or concerns.         Medication Assistance:  Wilder Glade obtained through [facility] and Me medication assistance program.  Enrollment ends 08/19/2021  Patient's preferred pharmacy is:  [facility] [address] [place] [address] Phone: [phone] Fax: [phone]   Uses pill box? Yes Pt endorses 100% compliance  Care Plan and Follow Up Patient Decision:  Patient agrees to Care Plan and Follow-up.  Plan: Telephone follow up appointment with care management team member scheduled for:  4 months and The patient has been provided with contact information for the care management team and has been advised to call with any health related questions or concerns.   Tomasa Blase, PharmD Clinical Pharmacist, [place] screening examination/treatment/procedure(s) were performed by non-physician practitioner and as supervising physician I was immediately available for consultation/collaboration.  I agree with above. Lew Dawes, MD

## 2021-07-06 NOTE — Patient Instructions (Signed)
You have been scheduled for a colonoscopy. Please follow written instructions given to you at your visit today.  Please pick up your prep supplies at the pharmacy within the next 1-3 days. If you use inhalers (even only as needed), please bring them with you on the day of your procedure.  The [facility] providers would like to encourage you to use MYCHART to communicate with providers for non-urgent requests or questions.  Due to long hold times on the telephone, sending your provider a message by MYCHART may be a faster and more efficient way to get a response.  Please allow 48 business hours for a response.  Please remember that this is for non-urgent requests.   Due to recent changes in healthcare laws, you may see the results of your imaging and laboratory studies on MyChart before your provider has had a chance to review them.  We understand that in some cases there may be results that are confusing or concerning to you. Not all laboratory results come back in the same time frame and the provider may be waiting for multiple results in order to interpret others.  Please give us 48 hours in order for your provider to thoroughly review all the results before contacting the office for clarification of your results.   Thank you for choosing me and [facility].  Malcolm T. Stark, Jr., MD., FACG  

## 2021-07-12 ENCOUNTER — Other Ambulatory Visit: Payer: Self-pay | Admitting: Internal Medicine

## 2021-07-19 DIAGNOSIS — N183 Chronic kidney disease, stage 3 unspecified: Secondary | ICD-10-CM | POA: Diagnosis not present

## 2021-07-19 DIAGNOSIS — I25709 Atherosclerosis of coronary artery bypass graft(s), unspecified, with unspecified angina pectoris: Secondary | ICD-10-CM | POA: Diagnosis not present

## 2021-07-19 DIAGNOSIS — E1159 Type 2 diabetes mellitus with other circulatory complications: Secondary | ICD-10-CM

## 2021-07-19 DIAGNOSIS — I10 Essential (primary) hypertension: Secondary | ICD-10-CM | POA: Diagnosis not present

## 2021-08-28 ENCOUNTER — Other Ambulatory Visit: Payer: Self-pay | Admitting: Internal Medicine

## 2021-08-29 ENCOUNTER — Telehealth: Payer: Self-pay | Admitting: Gastroenterology

## 2021-08-29 MED ORDER — PLENVU 140 G PO SOLR: 1.0000 | Freq: Once | ORAL | 0 refills | Status: AC

## 2021-08-29 NOTE — Telephone Encounter (Signed)
This patient is scheduled for a colonoscopy with Dr. Fuller Plan on 09/11/21 at 9 a.m.  He would like for his prep to be called into the [facility] on [address] in [place] so he can go ahead and pick it up.  Thank you.

## 2021-08-29 NOTE — Telephone Encounter (Signed)
Prescription resent to patient's pharmacy. 

## 2021-09-11 ENCOUNTER — Other Ambulatory Visit: Payer: Self-pay

## 2021-09-11 ENCOUNTER — Ambulatory Visit (AMBULATORY_SURGERY_CENTER): Payer: Medicare Other | Admitting: Gastroenterology

## 2021-09-11 ENCOUNTER — Encounter: Payer: Self-pay | Admitting: Gastroenterology

## 2021-09-11 VITALS — BP 181/56 | HR 60 | Temp 97.1°F | Resp 14 | Ht 71.0 in | Wt 245.0 lb

## 2021-09-11 DIAGNOSIS — I2510 Atherosclerotic heart disease of native coronary artery without angina pectoris: Secondary | ICD-10-CM | POA: Diagnosis not present

## 2021-09-11 DIAGNOSIS — E119 Type 2 diabetes mellitus without complications: Secondary | ICD-10-CM | POA: Diagnosis not present

## 2021-09-11 DIAGNOSIS — Z86010 Personal history of colonic polyps: Secondary | ICD-10-CM

## 2021-09-11 DIAGNOSIS — G4730 Sleep apnea, unspecified: Secondary | ICD-10-CM | POA: Diagnosis not present

## 2021-09-11 MED ORDER — SODIUM CHLORIDE 0.9 % IV SOLN: 500.0000 mL | Freq: Once | INTRAVENOUS | Status: DC

## 2021-09-11 NOTE — Progress Notes (Signed)
A/ox3, pleased with MAC, report to RN 

## 2021-09-11 NOTE — Patient Instructions (Signed)
° °  No recommendations for routine colonoscopy due to age. Continue routine medications.  YOU HAD AN ENDOSCOPIC PROCEDURE TODAY AT [facility]:   Refer to the procedure report that was given to you for any specific questions about what was found during the examination.  If the procedure report does not answer your questions, please call your gastroenterologist to clarify.  If you requested that your care partner not be given the details of your procedure findings, then the procedure report has been included in a sealed envelope for you to review at your convenience later.  YOU SHOULD EXPECT: Some feelings of bloating in the abdomen. Passage of more gas than usual.  Walking can help get rid of the air that was put into your GI tract during the procedure and reduce the bloating. If you had a lower endoscopy (such as a colonoscopy or flexible sigmoidoscopy) you may notice spotting of blood in your stool or on the toilet paper. If you underwent a bowel prep for your procedure, you may not have a normal bowel movement for a few days.  Please Note:  You might notice some irritation and congestion in your nose or some drainage.  This is from the oxygen used during your procedure.  There is no need for concern and it should clear up in a day or so.  SYMPTOMS TO REPORT IMMEDIATELY:  Following lower endoscopy (colonoscopy or flexible sigmoidoscopy):  Excessive amounts of blood in the stool  Significant tenderness or worsening of abdominal pains  Swelling of the abdomen that is new, acute  Fever of 100F or higher   For urgent or emergent issues, a gastroenterologist can be reached at any hour by calling [phone]. Do not use MyChart messaging for urgent concerns.    DIET:  We do recommend a small meal at first, but then you may proceed to your regular diet.  Drink plenty of fluids but you should avoid alcoholic beverages for 24 hours.  ACTIVITY:  You should plan to take it easy  for the rest of today and you should NOT DRIVE or use heavy machinery until tomorrow (because of the sedation medicines used during the test).    FOLLOW UP: Our staff will call the number listed on your records 48-72 hours following your procedure to check on you and address any questions or concerns that you may have regarding the information given to you following your procedure. If we do not reach you, we will leave a message.  We will attempt to reach you two times.  During this call, we will ask if you have developed any symptoms of COVID 19. If you develop any symptoms (ie: fever, flu-like symptoms, shortness of breath, cough etc.) before then, please call [phone].  If you test positive for Covid 19 in the 2 weeks post procedure, please call and report this information to [place].    If any biopsies were taken you will be contacted by phone or by letter within the next 1-3 weeks.  Please call us at [phone] if you have not heard about the biopsies in 3 weeks.    SIGNATURES/CONFIDENTIALITY: You and/or your care partner have signed paperwork which will be entered into your electronic medical record.  These signatures attest to the fact that that the information above on your After Visit Summary has been reviewed and is understood.  Full responsibility of the confidentiality of this discharge information lies with you and/or your care-partner.

## 2021-09-11 NOTE — Progress Notes (Signed)
VS-CW 

## 2021-09-11 NOTE — Op Note (Signed)
[place] Patient Name: Larry [name] Procedure Date: 09/11/2021 9:05 AM MRN: [id number] Endoscopist: Ladene Artist , MD Age: 81 Referring MD:  Date of Birth: May 27, 1941 Gender: Male Account #: [id number] Procedure:                Colonoscopy Indications:              Surveillance: Personal history of sessile serrated                            polyps and adenomatous polyps on last colonoscopy >                            5 years ago Medicines:                Monitored Anesthesia Care Procedure:                Pre-Anesthesia Assessment:                           - Prior to the procedure, a History and Physical                            was performed, and patient medications and                            allergies were reviewed. The patient's tolerance of                            previous anesthesia was also reviewed. The risks                            and benefits of the procedure and the sedation                            options and risks were discussed with the patient.                            All questions were answered, and informed consent                            was obtained. Prior Anticoagulants: The patient has                            taken no previous anticoagulant or antiplatelet                            agents. ASA Grade Assessment: III - A patient with                            severe systemic disease. After reviewing the risks                            and benefits, the patient was deemed in  satisfactory condition to undergo the procedure.                           After obtaining informed consent, the colonoscope                            was passed under direct vision. Throughout the                            procedure, the patient's blood pressure, pulse, and                            oxygen saturations were monitored continuously. The                            CF [id number] #[id number] was introduced through  the anus                            and advanced to the the cecum, identified by                            appendiceal orifice and ileocecal valve. The                            ileocecal valve, appendiceal orifice, and rectum                            were photographed. The quality of the bowel                            preparation was good. The colonoscopy was performed                            without difficulty. The patient tolerated the                            procedure well. Scope In: 9:21:54 AM Scope Out: 9:38:50 AM Scope Withdrawal Time: 0 hours 13 minutes 39 seconds  Total Procedure Duration: 0 hours 16 minutes 56 seconds  Findings:                 The perianal and digital rectal examinations were                            normal.                           A single medium-sized localized angiodysplastic                            lesion without bleeding was found in the ascending                            colon.  Internal hemorrhoids were found during                            retroflexion. The hemorrhoids were small and Grade                            I (internal hemorrhoids that do not prolapse).                           The exam was otherwise without abnormality on                            direct and retroflexion views. Complications:            No immediate complications. Estimated blood loss:                            None. Estimated Blood Loss:     Estimated blood loss: none. Impression:               - Internal hemorrhoids.                           - Ascending colon AVM.                           - The examination was otherwise normal on direct                            and retroflexion views.                           - No specimens collected. Recommendation:           - Patient has a contact number available for                            emergencies. The signs and symptoms of potential                            delayed  complications were discussed with the                            patient. Return to normal activities tomorrow.                            Written discharge instructions were provided to the                            patient.                           - Resume previous diet.                           - Continue present medications.                           -  No repeat colonoscopy due to age and the absence                            of colonic polyps. Ladene Artist, MD 09/11/2021 9:49:04 AM This report has been signed electronically.

## 2021-09-11 NOTE — Progress Notes (Signed)
History & Physical  Primary Care Physician:  Cassandria Anger, MD Primary Gastroenterologist: Lucio Edward, MD  CHIEF COMPLAINT:  Personal history of colon polyps   HPI: Larry [name] is a 81 y.o. male with a personal history of serrated sessile polyp and an adenomatous polyp on colonoscopy 5 years ago for colonoscopy today.    Past Medical History:  Diagnosis Date   Carotid stenosis, left    Cataracts, bilateral    removed   Coronary artery disease    Diabetes (HCC)    Heart valve problem    Hyperlipemia    Prostate CA ([place])    Sleep apnea    Dr Ledora Bottcher ; uses CPAP   Stroke [facility])    Tubular adenoma of colon 02/2016   Type II or unspecified type diabetes mellitus without mention of complication, not stated as uncontrolled    Unspecified essential hypertension     Past Surgical History:  Procedure Laterality Date   APPENDECTOMY     CARDIAC CATHETERIZATION     CATARACT EXTRACTION, BILATERAL     COLONOSCOPY     CORONARY ARTERY BYPASS GRAFT N/A 06/27/2017   Procedure: CORONARY ARTERY BYPASS GRAFTING (CABG), time four   using the left internal mammary artery, and right saphenous leg vein harvested endoscopically.;  Surgeon: Ivin Poot, MD;  Location: [place];  Service: Open Heart Surgery;  Laterality: N/A;   CYSTOSCOPY N/A 06/27/2017   Procedure: CYSTOSCOPY FLEXIBLE, Balloon dilatation placement of foley catheter;  Surgeon: Ivin Poot, MD;  Location: [place];  Service: Open Heart Surgery;  Laterality: N/A;   ENDARTERECTOMY Left 08/22/2017   Procedure: ENDARTERECTOMY CAROTID LEFT;  Surgeon: Serafina Mitchell, MD;  Location[facility];  Service: Vascular;  Laterality: Left;   INGUINAL HERNIA REPAIR Left    INTRAVASCULAR ULTRASOUND/IVUS N/A 06/24/2017   Procedure: Intravascular Ultrasound/IVUS;  Surgeon: Belva Crome, MD;  Location: [place] CV LAB;  Service: Cardiovascular;  Laterality: N/A;  LEFT MAIN   LEFT HEART CATH AND CORONARY ANGIOGRAPHY N/A 06/24/2017    Procedure: LEFT HEART CATH AND CORONARY ANGIOGRAPHY;  Surgeon: Belva Crome, MD;  Location: [place] CV LAB;  Service: Cardiovascular;  Laterality: N/A;   PATCH ANGIOPLASTY Left 08/22/2017   Procedure: PATCH ANGIOPLASTY USING Rueben Bash BIOLOGIC PATCH;  Surgeon: Serafina Mitchell, MD;  Location: [place] OR;  Service: Vascular;  Laterality: Left;   PROSTATECTOMY     TEE WITHOUT CARDIOVERSION N/A 06/27/2017   Procedure: TRANSESOPHAGEAL ECHOCARDIOGRAM (TEE);  Surgeon: Prescott Gum, Collier Salina, MD;  Location: [place];  Service: Open Heart Surgery;  Laterality: N/A;   TONSILLECTOMY     ULTRASOUND GUIDANCE FOR VASCULAR ACCESS  06/24/2017   Procedure: Ultrasound Guidance For Vascular Access;  Surgeon: Belva Crome, MD;  Location: [place] CV LAB;  Service: Cardiovascular;;    Prior to Admission medications   Medication Sig Start Date End Date Taking? Authorizing Provider  aspirin 325 MG tablet Take 1 tablet (325 mg total) by mouth daily. 11/24/18  Yes [name] D, MD  carvedilol (COREG) 12.5 MG tablet Take 12.5 mg by mouth 2 (two) times daily. 07/21/21  Yes [provider]  Cholecalciferol (VITAMIN D3) 1000 UNITS CAPS Take 1,000 Units by mouth daily.   Yes [provider]  Cyanocobalamin (VITAMIN B-12) 1000 MCG SUBL Place 1 tablet (1,000 mcg total) under the tongue daily. 06/20/20  Yes Plotnikov, [name], MD  furosemide (LASIX) 40 MG tablet Take one q am and one at hs prn  swelling Patient taking differently: Take 40 mg by mouth daily. 01/20/21  Yes Plotnikov, [name], MD  glucose blood (ACCU-CHEK SMARTVIEW) test strip USE 1 STRIP TO CHECK GLUCOSE TWICE DAILY E11.9 Patient taking differently: USE 1 STRIP TO CHECK GLUCOSE TWICE DAILY E11.9 - Uses Relion system 06/15/20  Yes Plotnikov, [name], MD  glucose blood (ACCU-CHEK SMARTVIEW) test strip USE 1 STRIP TO CHECK GLUCOSE ONCE DAILY 07/12/21  Yes Plotnikov, [name], MD  hydrALAZINE (APRESOLINE) 25 MG tablet TAKE 1 TABLET BY MOUTH THREE  TIMES DAILY 08/28/21  Yes Plotnikov, [name], MD  metFORMIN (GLUCOPHAGE) 1000 MG tablet TAKE 1 TABLET BY MOUTH TWICE DAILY WITH A MEAL 04/28/21  Yes Plotnikov, [name], MD  Multiple Vitamins-Minerals (MACULAR VITAMIN BENEFIT) TABS Take 1 tablet by mouth 2 (two) times daily.   Yes [provider]  repaglinide (PRANDIN) 1 MG tablet TAKE 1 TABLET BY MOUTH THREE TIMES DAILY WITH MEALS 06/27/21  Yes Plotnikov, [name], MD  simvastatin (ZOCOR) 40 MG tablet TAKE 1 TABLET BY MOUTH ONCE DAILY IN THE EVENING 06/27/21  Yes Plotnikov, [name], MD  thiamine 100 MG tablet Take 1 tablet (100 mg total) by mouth daily. 06/24/19  Yes Plotnikov, [name], MD  Accu-Chek FastClix Lancets MISC USE 1  TO CHECK GLUCOSE TWICE DAILY Patient taking differently: USE 1  TO CHECK GLUCOSE TWICE DAILY - Uses Relion system 03/02/20   Plotnikov, [name], MD  clotrimazole (LOTRIMIN) 1 % cream Apply 1 application topically 2 (two) times daily.    [provider]  fluorouracil (EFUDEX) 5 % cream Apply topically 2 (two) times daily.    [provider]  folic acid (FOLVITE) 1 MG tablet Take 1 tablet by mouth once daily 03/06/21   Plotnikov, [name], MD  hydrocortisone cream 1 % Apply 1 application topically 2 (two) times daily.    [provider]  loratadine (CLARITIN) 10 MG tablet Take 1 tablet (10 mg total) by mouth daily. Use for 1 month then prn Patient not taking: Reported on 09/11/2021 09/27/20   Plotnikov, [name], MD  nitroGLYCERIN (NITROSTAT) 0.4 MG SL tablet Place 1 tablet (0.4 mg total) under the tongue every 5 (five) minutes as needed for chest pain. 03/27/19   Plotnikov, [name], MD  PREVIDENT 5000 DRY MOUTH 1.1 % GEL dental gel USE ONCE DAILY IN PLACE OF REGULAR TOOTHPASTE. Patient not taking: Reported on 09/11/2021 08/30/20   [provider]    Current Outpatient Medications  Medication Sig Dispense Refill   aspirin 325 MG tablet Take 1 tablet (325 mg total) by mouth daily.  60 tablet 0   carvedilol (COREG) 12.5 MG tablet Take 12.5 mg by mouth 2 (two) times daily.     Cholecalciferol (VITAMIN D3) 1000 UNITS CAPS Take 1,000 Units by mouth daily.     Cyanocobalamin (VITAMIN B-12) 1000 MCG SUBL Place 1 tablet (1,000 mcg total) under the tongue daily. 100 tablet 3   furosemide (LASIX) 40 MG tablet Take one q am and one at hs prn swelling (Patient taking differently: Take 40 mg by mouth daily.) 135 tablet 3   glucose blood (ACCU-CHEK SMARTVIEW) test strip USE 1 STRIP TO CHECK GLUCOSE TWICE DAILY E11.9 (Patient taking differently: USE 1 STRIP TO CHECK GLUCOSE TWICE DAILY E11.9 - Uses Relion system) 180 each 3   glucose blood (ACCU-CHEK SMARTVIEW) test strip USE 1 STRIP TO CHECK GLUCOSE ONCE DAILY 100 each 3   hydrALAZINE (APRESOLINE) 25 MG tablet TAKE 1 TABLET  BY MOUTH THREE TIMES DAILY 270 tablet 0   metFORMIN (GLUCOPHAGE) 1000 MG tablet TAKE 1 TABLET BY MOUTH TWICE DAILY WITH A MEAL 180 tablet 1   Multiple Vitamins-Minerals (MACULAR VITAMIN BENEFIT) TABS Take 1 tablet by mouth 2 (two) times daily.     repaglinide (PRANDIN) 1 MG tablet TAKE 1 TABLET BY MOUTH THREE TIMES DAILY WITH MEALS 270 tablet 3   simvastatin (ZOCOR) 40 MG tablet TAKE 1 TABLET BY MOUTH ONCE DAILY IN THE EVENING 90 tablet 3   thiamine 100 MG tablet Take 1 tablet (100 mg total) by mouth daily. 90 tablet 3   Accu-Chek FastClix Lancets MISC USE 1  TO CHECK GLUCOSE TWICE DAILY (Patient taking differently: USE 1  TO CHECK GLUCOSE TWICE DAILY - Uses Relion system) 100 each 11   clotrimazole (LOTRIMIN) 1 % cream Apply 1 application topically 2 (two) times daily.     fluorouracil (EFUDEX) 5 % cream Apply topically 2 (two) times daily.     folic acid (FOLVITE) 1 MG tablet Take 1 tablet by mouth once daily 90 tablet 3   hydrocortisone cream 1 % Apply 1 application topically 2 (two) times daily.     loratadine (CLARITIN) 10 MG tablet Take 1 tablet (10 mg total) by mouth daily. Use for 1 month then prn (Patient not  taking: Reported on 09/11/2021) 100 tablet 3   nitroGLYCERIN (NITROSTAT) 0.4 MG SL tablet Place 1 tablet (0.4 mg total) under the tongue every 5 (five) minutes as needed for chest pain. 20 tablet 3   PREVIDENT 5000 DRY MOUTH 1.1 % GEL dental gel USE ONCE DAILY IN PLACE OF REGULAR TOOTHPASTE. (Patient not taking: Reported on 09/11/2021)     Current Facility-Administered Medications  Medication Dose Route Frequency Provider Last Rate Last Admin   0.9 %  sodium chloride infusion  500 mL Intravenous Once Ladene Artist, MD        Allergies as of 09/11/2021 - Review Complete 09/11/2021  Allergen Reaction Noted   Amlodipine Swelling 10/11/2015   Verapamil Swelling 04/23/2012   Clindamycin hcl     Farxiga [dapagliflozin]  05/22/2021   Contrast media [iodinated contrast media] Nausea And Vomiting 08/15/2017    Family History  Problem Relation Age of Onset   Heart disease Mother    Heart disease Father    Heart attack Brother    Diabetes Other    Hypertension Other    Colon cancer Neg Hx    Esophageal cancer Neg Hx    Rectal cancer Neg Hx    Stomach cancer Neg Hx     Social History   Socioeconomic History   Marital status: Married    Spouse name: Not on file   Number of children: 4   Years of education: Not on file   Highest education level: Bachelor's degree (e.g., BA, AB, BS)  Occupational History   Occupation: Salesman    Comment: retired  Tobacco Use   Smoking status: Never   Smokeless tobacco: Never  Vaping Use   Vaping Use: Never used  Substance and Sexual Activity   Alcohol use: No    Alcohol/week: 0.0 standard drinks   Drug use: No   Sexual activity: Not on file  Other Topics Concern   Not on file  Social History Narrative   Regular Exercise- no, golf   Social Determinants of Health   Financial Resource Strain: Low Risk    Difficulty of Paying Living Expenses: Not hard at all  Food Insecurity: No  Food Insecurity   Worried About Charity fundraiser in the  Last Year: Never true   Ran Out of Food in the Last Year: Never true  Transportation Needs: No Transportation Needs   Lack of Transportation (Medical): No   Lack of Transportation (Non-Medical): No  Physical Activity: Insufficiently Active   Days of Exercise per Week: 2 days   Minutes of Exercise per Session: 30 min  Stress: No Stress Concern Present   Feeling of Stress : Not at all  Social Connections: Moderately Integrated   Frequency of Communication with Friends and Family: More than three times a week   Frequency of Social Gatherings with Friends and Family: Once a week   Attends Religious Services: More than 4 times per year   Active Member of [employer] or Organizations: No   Attends Archivist Meetings: Never   Marital Status: Married  Human resources officer Violence: Not At Risk   Fear of Current or Ex-Partner: No   Emotionally Abused: No   Physically Abused: No   Sexually Abused: No    Review of Systems:  All systems reviewed an negative except where noted in HPI.  Gen: Denies any fever, chills, sweats, anorexia, fatigue, weakness, malaise, weight loss, and sleep disorder CV: Denies chest pain, angina, palpitations, syncope, orthopnea, PND, peripheral edema, and claudication. Resp: Denies dyspnea at rest, dyspnea with exercise, cough, sputum, wheezing, coughing up blood, and pleurisy. GI: Denies vomiting blood, jaundice, and fecal incontinence.   Denies dysphagia or odynophagia. GU : Denies urinary burning, blood in urine, urinary frequency, urinary hesitancy, nocturnal urination, and urinary incontinence. MS: Denies joint pain, limitation of movement, and swelling, stiffness, low back pain, extremity pain. Denies muscle weakness, cramps, atrophy.  Derm: Denies rash, itching, dry skin, hives, moles, warts, or unhealing ulcers.  Psych: Denies depression, anxiety, memory loss, suicidal ideation, hallucinations, paranoia, and confusion. Heme: Denies bruising, bleeding, and  enlarged lymph nodes. Neuro:  Denies any headaches, dizziness, paresthesias. Endo:  Denies any problems with DM, thyroid, adrenal function.   Physical Exam: Vital signs in last 24 hours: General:  Alert, well-developed, in NAD Head:  Normocephalic and atraumatic. Eyes:  Sclera clear, no icterus.   Conjunctiva pink. Ears:  Normal auditory acuity. Mouth:  No deformity or lesions.  Neck:  Supple; no masses . Lungs:  Clear throughout to auscultation.   No wheezes, crackles, or rhonchi. No acute distress. Heart:  Regular rate and rhythm; no murmurs. Abdomen:  Soft, nondistended, nontender. No masses, hepatomegaly. No obvious masses.  Normal bowel .    Rectal:  Deferred   Msk:  Symmetrical without gross deformities.. Pulses:  Normal pulses noted. Extremities:  Without edema. Neurologic:  Alert and  oriented x4;  grossly normal neurologically. Skin:  Intact without significant lesions or rashes. Cervical Nodes:  No significant cervical adenopathy. Psych:  Alert and cooperative. Normal mood and affect.   Impression / Plan:   Personal history of serrated sessile polyp and an adenomatous polyp on colonoscopy 5 years ago for colonoscopy today.   Pricilla Riffle. Fuller Plan  09/11/2021, 9:10 AM See Shea Evans, [facility], to contact our on call provider

## 2021-09-13 ENCOUNTER — Telehealth: Payer: Self-pay | Admitting: *Deleted

## 2021-09-13 NOTE — Telephone Encounter (Signed)
°  Follow up Call-  Call back number 09/11/2021  Post procedure Call Back phone  # [phone]  Permission to leave phone message Yes  Some recent data might be hidden     Patient questions:  Do you have a fever, pain , or abdominal swelling? No. Pain Score  0 *  Have you tolerated food without any problems? Yes.    Have you been able to return to your normal activities? Yes.    Do you have any questions about your discharge instructions: Diet   No. Medications  No. Follow up visit  No.  Do you have questions or concerns about your Care? No.  Actions: * If pain score is 4 or above: No action needed, pain <4.

## 2021-09-25 ENCOUNTER — Ambulatory Visit: Payer: Medicare Other | Admitting: Internal Medicine

## 2021-09-27 ENCOUNTER — Ambulatory Visit: Payer: Medicare Other | Admitting: Internal Medicine

## 2021-10-04 ENCOUNTER — Other Ambulatory Visit: Payer: Self-pay

## 2021-10-04 ENCOUNTER — Ambulatory Visit (INDEPENDENT_AMBULATORY_CARE_PROVIDER_SITE_OTHER): Payer: Medicare Other | Admitting: Internal Medicine

## 2021-10-04 ENCOUNTER — Encounter: Payer: Self-pay | Admitting: Internal Medicine

## 2021-10-04 DIAGNOSIS — I10 Essential (primary) hypertension: Secondary | ICD-10-CM | POA: Diagnosis not present

## 2021-10-04 DIAGNOSIS — N183 Chronic kidney disease, stage 3 unspecified: Secondary | ICD-10-CM | POA: Diagnosis not present

## 2021-10-04 DIAGNOSIS — E1159 Type 2 diabetes mellitus with other circulatory complications: Secondary | ICD-10-CM

## 2021-10-04 DIAGNOSIS — N481 Balanitis: Secondary | ICD-10-CM | POA: Diagnosis not present

## 2021-10-04 LAB — HEMOGLOBIN A1C: Hgb A1c MFr Bld: 7 % — ABNORMAL HIGH (ref 4.6–6.5)

## 2021-10-04 MED ORDER — KETOCONAZOLE 2 % EX CREA: 1.0000 "application " | TOPICAL_CREAM | Freq: Two times a day (BID) | CUTANEOUS | 1 refills | Status: DC

## 2021-10-04 MED ORDER — FLUCONAZOLE 100 MG PO TABS: 100.0000 mg | ORAL_TABLET | Freq: Every day | ORAL | 1 refills | Status: DC

## 2021-10-04 MED ORDER — ACCU-CHEK SMARTVIEW VI STRP: ORAL_STRIP | 3 refills | Status: DC

## 2021-10-04 NOTE — Assessment & Plan Note (Signed)
Wilder Glade caused a lot of yeast infection - [name] stopped 2 mo ago

## 2021-10-04 NOTE — Assessment & Plan Note (Signed)
Larry [name] caused a lot of yeast infection - Larry [name] stopped 2 mo ago

## 2021-10-04 NOTE — Progress Notes (Signed)
Subjective:  Patient ID: Larry [name], male    DOB: 05/22/1941  Age: 81 y.o. MRN: [id number]  CC: No chief complaint on file.   HPI Larry [name] presents for DM, HTN, CAD C/o urinary leakage - Larry [name] saw Dr Larry [name] C/o penile rash on Farxiga - stopped it 2 mo ago. On Clotrimazole and HCT cream  Outpatient Medications Prior to Visit  Medication Sig Dispense Refill   Accu-Chek FastClix Lancets MISC USE 1  TO CHECK GLUCOSE TWICE DAILY (Patient taking differently: USE 1  TO CHECK GLUCOSE TWICE DAILY - Uses Relion system) 100 each 11   aspirin 325 MG tablet Take 1 tablet (325 mg total) by mouth daily. 60 tablet 0   carvedilol (COREG) 12.5 MG tablet Take 12.5 mg by mouth 2 (two) times daily.     Cholecalciferol (VITAMIN D3) 1000 UNITS CAPS Take 1,000 Units by mouth daily.     clotrimazole (LOTRIMIN) 1 % cream Apply 1 application topically 2 (two) times daily.     Cyanocobalamin (VITAMIN B-12) 1000 MCG SUBL Place 1 tablet (1,000 mcg total) under the tongue daily. 100 tablet 3   fluorouracil (EFUDEX) 5 % cream Apply topically 2 (two) times daily.     folic acid (FOLVITE) 1 MG tablet Take 1 tablet by mouth once daily 90 tablet 3   furosemide (LASIX) 40 MG tablet Take one q am and one at hs prn swelling (Patient taking differently: Take 40 mg by mouth daily.) 135 tablet 3   glucose blood (ACCU-CHEK SMARTVIEW) test strip USE 1 STRIP TO CHECK GLUCOSE TWICE DAILY E11.9 (Patient taking differently: USE 1 STRIP TO CHECK GLUCOSE TWICE DAILY E11.9 - Uses Relion system) 180 each 3   glucose blood (ACCU-CHEK SMARTVIEW) test strip USE 1 STRIP TO CHECK GLUCOSE ONCE DAILY 100 each 3   hydrALAZINE (APRESOLINE) 25 MG tablet TAKE 1 TABLET BY MOUTH THREE TIMES DAILY 270 tablet 0   hydrocortisone cream 1 % Apply 1 application topically 2 (two) times daily.     metFORMIN (GLUCOPHAGE) 1000 MG tablet TAKE 1 TABLET BY MOUTH TWICE DAILY WITH A MEAL 180 tablet 1   Multiple Vitamins-Minerals (MACULAR VITAMIN BENEFIT) TABS  Take 1 tablet by mouth 2 (two) times daily.     nitroGLYCERIN (NITROSTAT) 0.4 MG SL tablet Place 1 tablet (0.4 mg total) under the tongue every 5 (five) minutes as needed for chest pain. 20 tablet 3   repaglinide (PRANDIN) 1 MG tablet TAKE 1 TABLET BY MOUTH THREE TIMES DAILY WITH MEALS 270 tablet 3   simvastatin (ZOCOR) 40 MG tablet TAKE 1 TABLET BY MOUTH ONCE DAILY IN THE EVENING 90 tablet 3   thiamine 100 MG tablet Take 1 tablet (100 mg total) by mouth daily. 90 tablet 3   loratadine (CLARITIN) 10 MG tablet Take 1 tablet (10 mg total) by mouth daily. Use for 1 month then prn (Patient not taking: Reported on 09/11/2021) 100 tablet 3   PREVIDENT 5000 DRY MOUTH 1.1 % GEL dental gel USE ONCE DAILY IN PLACE OF REGULAR TOOTHPASTE. (Patient not taking: Reported on 09/11/2021)     No facility-administered medications prior to visit.    ROS: Review of Systems  Constitutional:  Negative for appetite change, fatigue and unexpected weight change.  HENT:  Negative for congestion, nosebleeds, sneezing, sore throat and trouble swallowing.   Eyes:  Negative for itching and visual disturbance.  Respiratory:  Negative for cough.   Cardiovascular:  Positive for leg swelling. Negative for chest pain and  palpitations.  Gastrointestinal:  Negative for abdominal distention, blood in stool, diarrhea and nausea.  Genitourinary:  Positive for urgency. Negative for frequency and hematuria.  Musculoskeletal:  Negative for back pain, gait problem, joint swelling and neck pain.  Skin:  Negative for rash.  Neurological:  Negative for dizziness, tremors, speech difficulty and weakness.  Psychiatric/Behavioral:  Negative for agitation, dysphoric mood, sleep disturbance and suicidal ideas. The patient is not nervous/anxious.    Objective:  BP 130/80 (BP Location: Left Arm, Patient Position: Sitting, Cuff Size: Large)    Pulse 63    Temp 98.3 F (36.8 C) (Oral)    Ht 5\' 11"  (1.803 m)    Wt 243 lb (110.2 kg)    SpO2 97%     BMI 33.89 kg/m   BP Readings from Last 3 Encounters:  10/04/21 130/80  09/11/21 (!) 181/56  07/06/21 (!) 156/70    Wt Readings from Last 3 Encounters:  10/04/21 243 lb (110.2 kg)  09/11/21 245 lb (111.1 kg)  07/06/21 245 lb 6 oz (111.3 kg)    Physical Exam Constitutional:      General: He is not in acute distress.    Appearance: He is well-developed.     Comments: NAD  Eyes:     Conjunctiva/sclera: Conjunctivae normal.     Pupils: Pupils are equal, round, and reactive to light.  Neck:     Thyroid: No thyromegaly.     Vascular: No JVD.  Cardiovascular:     Rate and Rhythm: Normal rate and regular rhythm.     Heart sounds: Normal heart sounds. No murmur heard.   No friction rub. No gallop.  Pulmonary:     Effort: Pulmonary effort is normal. No respiratory distress.     Breath sounds: Normal breath sounds. No wheezing or rales.  Chest:     Chest wall: No tenderness.  Abdominal:     General: Bowel sounds are normal. There is no distension.     Palpations: Abdomen is soft. There is no mass.     Tenderness: There is no abdominal tenderness. There is no guarding or rebound.  Musculoskeletal:        General: No tenderness. Normal range of motion.     Cervical back: Normal range of motion.  Lymphadenopathy:     Cervical: No cervical adenopathy.  Skin:    General: Skin is warm and dry.     Findings: No rash.  Neurological:     Mental Status: He is alert and oriented to person, place, and time.     Cranial Nerves: No cranial nerve deficit.     Motor: No abnormal muscle tone.     Coordination: Coordination normal.     Gait: Gait normal.     Deep Tendon Reflexes: Reflexes are normal and symmetric.  Psychiatric:        Behavior: Behavior normal.        Thought Content: Thought content normal.        Judgment: Judgment normal.    Lab Results  Component Value Date   WBC 9.4 06/16/2020   HGB 13.1 06/16/2020   HCT 39.4 06/16/2020   PLT 240.0 06/16/2020   GLUCOSE 176  (H) 05/22/2021   CHOL 125 06/16/2020   TRIG 99.0 06/16/2020   HDL 41.10 06/16/2020   LDLCALC 64 06/16/2020   ALT 14 05/22/2021   AST 15 05/22/2021   NA 141 05/22/2021   K 3.7 05/22/2021   CL 100 05/22/2021   CREATININE 1.38 05/22/2021  BUN 28 (H) 05/22/2021   CO2 31 05/22/2021   TSH 2.28 01/20/2021   PSA 0.00 Repeated and verified X2. (L) 12/26/2012   INR 1.0 11/19/2018   HGBA1C 7.0 (H) 05/22/2021    MR Lumbar Spine Wo Contrast  Result Date: 09/28/2019 CLINICAL DATA:  81 year old male with low back pain radiating down the left leg since July. Symptoms after lawnmower accident. EXAM: MRI LUMBAR SPINE WITHOUT CONTRAST TECHNIQUE: Multiplanar, multisequence MR imaging of the lumbar spine was performed. No intravenous contrast was administered. COMPARISON:  Lumbar MRI 04/04/2016. Abdominal radiograph 11/20/2018. FINDINGS: Segmentation: Transitional anatomy. For the purposes of this report the same numbering system will be used as on the 2017 radiographs which designates a partially lumbarized S1 level with full size S1-S2 disc space. By this numbering system there are hypoplastic ribs at L1 (series 13 image 1). Correlation with radiographs is recommended prior to any operative intervention. Alignment: Stable lumbar lordosis since 2017. Mild retrolisthesis of L2 on L3, L3 on L4, L5 on S1. Vertebrae: No marrow edema or evidence of acute osseous abnormality. Visualized bone marrow signal is within normal limits. Intact visible sacrum and SI joints. Conus medullaris and cauda equina: Conus extends to the T12-L1 level, minimally visible and negative. Paraspinal and other soft tissues: Negative. Disc levels: T12-L1:  Negative. L1-L2: Mild posterior disc bulge or protrusion is stable without stenosis. Mild left facet hypertrophy. L2-L3: Chronic circumferential disc bulge with broad-based posterior and foraminal components. Mild facet hypertrophy. Mild disc progression since 2017 along with new mild spinal  stenosis. No convincing lateral recess or foraminal stenosis. L3-L4: Disc desiccation with circumferential disc bulge. Chronic posterior annular fissure. Moderate posterior element hypertrophy and mild epidural lipomatosis. This level has progressed since 2017 with moderate spinal stenosis. Mild left lateral recess stenosis. Moderate left and mild right L3 foraminal stenosis. L4-L5: Mild circumferential disc bulge and moderate posterior element hypertrophy appears stable. No significant spinal or lateral recess stenosis. There is chronic moderate to severe left L4 foraminal stenosis on series 5, image 11. L5-S1: Circumferential disc bulge with mild posterior element hypertrophy. No spinal or lateral recess stenosis. Mild to moderate left and mild right L5 foraminal stenosis is stable. S1-S2: Full size disc space. Mostly sacralized. Probable left side assimilation joint. No stenosis. IMPRESSION: 1. 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 Correlation with radiographs is recommended prior to any operative intervention. 2. 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 Moderate multifactorial spinal stenosis at the latter with mild left lateral recess and moderate left foraminal stenosis. Query left L3 and/or L4 radiculitis. 3. Superimposed chronic moderate to severe left L4 foraminal stenosis also at L4-L5. Up to moderate chronic left L5 foraminal stenosis. Electronically Signed   By: Genevie Ann M.D.   On: 09/28/2019 07:29    Assessment & Plan:   Problem List Items Addressed This Visit   None     Meds ordered this encounter  Medications   fluconazole (DIFLUCAN) 100 MG tablet    Sig: Take 1 tablet (100 mg total) by mouth daily.    Dispense:  14 tablet    Refill:  1   ketoconazole (NIZORAL) 2 % cream    Sig: Apply 1 application topically 2 (two) times daily.    Dispense:  45 g    Refill:  1      Follow-up: No follow-ups on  file.  Walker Kehr, MD  Medical screening examination/treatment/procedure(s) were performed by non-physician  practitioner and as supervising physician I was immediately available for consultation/collaboration.  I agree with above. Lew Dawes, MD

## 2021-10-04 NOTE — Assessment & Plan Note (Signed)
Refractory Farxiga caused a lot of yeast infection - Larry [name] stopped 2 mo ago Start po Diflucan and Ketocon cream bid

## 2021-10-05 LAB — COMPREHENSIVE METABOLIC PANEL
ALT: 11 U/L (ref 0–53)
AST: 13 U/L (ref 0–37)
Albumin: 4.1 g/dL (ref 3.5–5.2)
Alkaline Phosphatase: 67 U/L (ref 39–117)
BUN: 25 mg/dL — ABNORMAL HIGH (ref 6–23)
CO2: 31 mEq/L (ref 19–32)
Calcium: 9.7 mg/dL (ref 8.4–10.5)
Chloride: 106 mEq/L (ref 96–112)
Creatinine, Ser: 1.22 mg/dL (ref 0.40–1.50)
GFR: 56.23 mL/min — ABNORMAL LOW (ref >60.00)
Glucose, Bld: 125 mg/dL — ABNORMAL HIGH (ref 70–99)
Potassium: 4.4 mEq/L (ref 3.5–5.1)
Sodium: 141 mEq/L (ref 135–145)
Total Bilirubin: 0.6 mg/dL (ref 0.2–1.2)
Total Protein: 7.1 g/dL (ref 6.0–8.3)

## 2021-10-18 ENCOUNTER — Other Ambulatory Visit: Payer: Self-pay

## 2021-10-18 ENCOUNTER — Encounter (HOSPITAL_COMMUNITY): Payer: Self-pay | Admitting: Emergency Medicine

## 2021-10-18 ENCOUNTER — Encounter: Payer: Self-pay | Admitting: Emergency Medicine

## 2021-10-18 ENCOUNTER — Emergency Department (HOSPITAL_COMMUNITY)
Admission: EM | Admit: 2021-10-18 | Discharge: 2021-10-18 | Disposition: A | Discharge status: Home / Self Care | Payer: Medicare Other | Attending: Emergency Medicine | Admitting: Emergency Medicine

## 2021-10-18 ENCOUNTER — Emergency Department (HOSPITAL_COMMUNITY): Payer: Medicare Other

## 2021-10-18 ENCOUNTER — Ambulatory Visit (INDEPENDENT_AMBULATORY_CARE_PROVIDER_SITE_OTHER): Payer: Medicare Other | Admitting: Emergency Medicine

## 2021-10-18 VITALS — BP 194/80 | HR 69 | Ht 71.0 in | Wt 235.0 lb

## 2021-10-18 DIAGNOSIS — R29810 Facial weakness: Secondary | ICD-10-CM | POA: Diagnosis not present

## 2021-10-18 DIAGNOSIS — I10 Essential (primary) hypertension: Secondary | ICD-10-CM | POA: Diagnosis not present

## 2021-10-18 DIAGNOSIS — Z7984 Long term (current) use of oral hypoglycemic drugs: Secondary | ICD-10-CM | POA: Diagnosis not present

## 2021-10-18 DIAGNOSIS — R7989 Other specified abnormal findings of blood chemistry: Secondary | ICD-10-CM | POA: Diagnosis not present

## 2021-10-18 DIAGNOSIS — Z79899 Other long term (current) drug therapy: Secondary | ICD-10-CM | POA: Insufficient documentation

## 2021-10-18 DIAGNOSIS — R519 Headache, unspecified: Secondary | ICD-10-CM | POA: Diagnosis not present

## 2021-10-18 DIAGNOSIS — R42 Dizziness and giddiness: Secondary | ICD-10-CM

## 2021-10-18 DIAGNOSIS — Z7982 Long term (current) use of aspirin: Secondary | ICD-10-CM | POA: Diagnosis not present

## 2021-10-18 DIAGNOSIS — I160 Hypertensive urgency: Secondary | ICD-10-CM | POA: Diagnosis not present

## 2021-10-18 DIAGNOSIS — G4452 New daily persistent headache (NDPH): Secondary | ICD-10-CM | POA: Insufficient documentation

## 2021-10-18 DIAGNOSIS — I6789 Other cerebrovascular disease: Secondary | ICD-10-CM | POA: Diagnosis not present

## 2021-10-18 DIAGNOSIS — R930 Abnormal findings on diagnostic imaging of skull and head, not elsewhere classified: Secondary | ICD-10-CM | POA: Diagnosis not present

## 2021-10-18 DIAGNOSIS — H538 Other visual disturbances: Secondary | ICD-10-CM | POA: Diagnosis not present

## 2021-10-18 DIAGNOSIS — Z20822 Contact with and (suspected) exposure to covid-19: Secondary | ICD-10-CM | POA: Insufficient documentation

## 2021-10-18 LAB — URINALYSIS, ROUTINE W REFLEX MICROSCOPIC
Bilirubin Urine: NEGATIVE
Glucose, UA: 50 mg/dL — AB
Hgb urine dipstick: NEGATIVE
Ketones, ur: NEGATIVE mg/dL
Leukocytes,Ua: NEGATIVE
Nitrite: NEGATIVE
Protein, ur: NEGATIVE mg/dL
Specific Gravity, Urine: 1.017 (ref 1.005–1.030)
pH: 5 (ref 5.0–8.0)

## 2021-10-18 LAB — I-STAT CHEM 8, ED
BUN: 32 mg/dL — ABNORMAL HIGH (ref 8–23)
Calcium, Ion: 1.21 mmol/L (ref 1.15–1.40)
Chloride: 97 mmol/L — ABNORMAL LOW (ref 98–111)
Creatinine, Ser: 1.7 mg/dL — ABNORMAL HIGH (ref 0.61–1.24)
Glucose, Bld: 173 mg/dL — ABNORMAL HIGH (ref 70–99)
HCT: 42 % (ref 39.0–52.0)
Hemoglobin: 14.3 g/dL (ref 13.0–17.0)
Potassium: 3.8 mmol/L (ref 3.5–5.1)
Sodium: 138 mmol/L (ref 135–145)
TCO2: 31 mmol/L (ref 22–32)

## 2021-10-18 LAB — COMPREHENSIVE METABOLIC PANEL
ALT: 13 U/L (ref 0–44)
AST: 16 U/L (ref 15–41)
Albumin: 4.2 g/dL (ref 3.5–5.0)
Alkaline Phosphatase: 69 U/L (ref 38–126)
Anion gap: 6 (ref 5–15)
BUN: 34 mg/dL — ABNORMAL HIGH (ref 8–23)
CO2: 28 mmol/L (ref 22–32)
Calcium: 9.3 mg/dL (ref 8.9–10.3)
Chloride: 101 mmol/L (ref 98–111)
Creatinine, Ser: 1.48 mg/dL — ABNORMAL HIGH (ref 0.61–1.24)
GFR, Estimated: 48 mL/min — ABNORMAL LOW (ref >60)
Glucose, Bld: 189 mg/dL — ABNORMAL HIGH (ref 70–99)
Potassium: 3.9 mmol/L (ref 3.5–5.1)
Sodium: 135 mmol/L (ref 135–145)
Total Bilirubin: 0.4 mg/dL (ref 0.3–1.2)
Total Protein: 7.4 g/dL (ref 6.5–8.1)

## 2021-10-18 LAB — DIFFERENTIAL
Abs Immature Granulocytes: 0.02 10*3/uL (ref 0.00–0.07)
Basophils Absolute: 0.1 10*3/uL (ref 0.0–0.1)
Basophils Relative: 1 %
Eosinophils Absolute: 0.1 10*3/uL (ref 0.0–0.5)
Eosinophils Relative: 1 %
Immature Granulocytes: 0 %
Lymphocytes Relative: 41 %
Lymphs Abs: 4.4 10*3/uL — ABNORMAL HIGH (ref 0.7–4.0)
Monocytes Absolute: 0.9 10*3/uL (ref 0.1–1.0)
Monocytes Relative: 8 %
Neutro Abs: 5.2 10*3/uL (ref 1.7–7.7)
Neutrophils Relative %: 49 %

## 2021-10-18 LAB — CBC
HCT: 45 % (ref 39.0–52.0)
Hemoglobin: 14.8 g/dL (ref 13.0–17.0)
MCH: 30.3 pg (ref 26.0–34.0)
MCHC: 32.9 g/dL (ref 30.0–36.0)
MCV: 92.2 fL (ref 80.0–100.0)
Platelets: 229 10*3/uL (ref 150–400)
RBC: 4.88 MIL/uL (ref 4.22–5.81)
RDW: 12.6 % (ref 11.5–15.5)
WBC: 10.8 10*3/uL — ABNORMAL HIGH (ref 4.0–10.5)
nRBC: 0 % (ref 0.0–0.2)

## 2021-10-18 LAB — APTT: aPTT: 29 seconds (ref 24–36)

## 2021-10-18 LAB — PROTIME-INR
INR: 1 (ref 0.8–1.2)
Prothrombin Time: 13.1 seconds (ref 11.4–15.2)

## 2021-10-18 LAB — RAPID URINE DRUG SCREEN, HOSP PERFORMED
Amphetamines: NOT DETECTED
Barbiturates: NOT DETECTED
Benzodiazepines: NOT DETECTED
Cocaine: NOT DETECTED
Opiates: NOT DETECTED
Tetrahydrocannabinol: NOT DETECTED

## 2021-10-18 LAB — RESP PANEL BY RT-PCR (FLU A&B, COVID) ARPGX2
Influenza A by PCR: NEGATIVE
Influenza B by PCR: NEGATIVE
SARS Coronavirus 2 by RT PCR: NEGATIVE

## 2021-10-18 LAB — LACTIC ACID, PLASMA: Lactic Acid, Venous: 1.6 mmol/L (ref 0.5–1.9)

## 2021-10-18 LAB — ETHANOL: Alcohol, Ethyl (B): <10 mg/dL (ref <10)

## 2021-10-18 MED ORDER — SODIUM CHLORIDE 0.9 % IV BOLUS
1000.0000 mL | Freq: Once | INTRAVENOUS | Status: AC
  Administered 2021-10-18: 1000 mL via INTRAVENOUS

## 2021-10-18 MED ORDER — ONDANSETRON HCL 4 MG/2ML IJ SOLN
4.0000 mg | Freq: Once | INTRAMUSCULAR | Status: AC
  Administered 2021-10-18: 4 mg via INTRAVENOUS
  Filled 2021-10-18: qty 2

## 2021-10-18 NOTE — Assessment & Plan Note (Signed)
Concerned about posterior circulation stroke. ?Needs diagnostic brain imaging. ?We will send to ER for further evaluation and treatment now. ?

## 2021-10-18 NOTE — Assessment & Plan Note (Signed)
Hypertensive urgency with CNS symptoms. 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 ?Needs further evaluation and treatment in emergency department setting. ?

## 2021-10-18 NOTE — ED Notes (Signed)
Patient transported to MRI 

## 2021-10-18 NOTE — Discharge Instructions (Signed)
Please follow-up with your primary care doctor.  Please continue to take your blood pressure medications as prescribed.  We may return to the emergency room for any new or concerning symptoms. ?

## 2021-10-18 NOTE — ED Provider Notes (Signed)
[facility] DEPT Provider Note   CSN: [id number] Arrival date & time: 10/18/21  1027     History  No chief complaint on file.   Larry [name] is a 81 y.o. male.  HPI  Patient is a 81 year old male       Home Medications Prior to Admission medications   Medication Sig Start Date End Date Taking? Authorizing Provider  Accu-Chek FastClix Lancets MISC USE 1  TO CHECK GLUCOSE TWICE DAILY Patient taking differently: USE 1  TO CHECK GLUCOSE TWICE DAILY - Uses Relion system 03/02/20   Plotnikov, [name], MD  aspirin 325 MG tablet Take 1 tablet (325 mg total) by mouth daily. 11/24/18   [name] D, MD  carvedilol (COREG) 12.5 MG tablet Take 12.5 mg by mouth 2 (two) times daily. 07/21/21   [provider]  Cholecalciferol (VITAMIN D3) 1000 UNITS CAPS Take 1,000 Units by mouth daily.    [provider]  clotrimazole (LOTRIMIN) 1 % cream Apply 1 application topically 2 (two) times daily.    [provider]  Cyanocobalamin (VITAMIN B-12) 1000 MCG SUBL Place 1 tablet (1,000 mcg total) under the tongue daily. 06/20/20   Plotnikov, [name], MD  fluconazole (DIFLUCAN) 100 MG tablet Take 1 tablet (100 mg total) by mouth daily. 10/04/21   Plotnikov, [name], MD  fluorouracil (EFUDEX) 5 % cream Apply topically 2 (two) times daily.    [provider]  folic acid (FOLVITE) 1 MG tablet Take 1 tablet by mouth once daily 03/06/21   Plotnikov, [name], MD  furosemide (LASIX) 40 MG tablet Take one q am and one at hs prn swelling Patient taking differently: Take 40 mg by mouth daily. 01/20/21   Plotnikov, [name], MD  glucose blood (ACCU-CHEK SMARTVIEW) test strip USE 1 STRIP TO CHECK GLUCOSE ONCE DAILY 07/12/21   Plotnikov, [name], MD  glucose blood (ACCU-CHEK SMARTVIEW) test strip USE 1 STRIP TO CHECK GLUCOSE qd prn E11.9 10/04/21   Plotnikov, [name], MD  hydrALAZINE (APRESOLINE) 25 MG tablet TAKE 1 TABLET BY MOUTH THREE  TIMES DAILY 08/28/21   Plotnikov, [name], MD  hydrocortisone cream 1 % Apply 1 application topically 2 (two) times daily.    [provider]  ketoconazole (NIZORAL) 2 % cream Apply 1 application topically 2 (two) times daily. 10/04/21 10/04/22  Plotnikov, [name], MD  loratadine (CLARITIN) 10 MG tablet Take 1 tablet (10 mg total) by mouth daily. Use for 1 month then prn 09/27/20   Plotnikov, [name], MD  metFORMIN (GLUCOPHAGE) 1000 MG tablet TAKE 1 TABLET BY MOUTH TWICE DAILY WITH A MEAL 04/28/21   Plotnikov, [name], MD  Multiple Vitamins-Minerals (MACULAR VITAMIN BENEFIT) TABS Take 1 tablet by mouth 2 (two) times daily.    [provider]  nitroGLYCERIN (NITROSTAT) 0.4 MG SL tablet Place 1 tablet (0.4 mg total) under the tongue every 5 (five) minutes as needed for chest pain. 03/27/19   Plotnikov, [name], MD  PREVIDENT 5000 DRY MOUTH 1.1 % GEL dental gel  08/30/20   [provider]  repaglinide (PRANDIN) 1 MG tablet TAKE 1 TABLET BY MOUTH THREE TIMES DAILY WITH MEALS 06/27/21   Plotnikov, [name], MD  simvastatin (ZOCOR) 40 MG tablet TAKE 1 TABLET BY MOUTH ONCE DAILY IN THE EVENING 06/27/21   Plotnikov, [name], MD  thiamine 100 MG tablet Take 1 tablet (100 mg total) by mouth daily. 06/24/19   Plotnikov, [name], MD  Allergies    Amlodipine, Verapamil, Clindamycin hcl, Farxiga [dapagliflozin], and Contrast media [iodinated contrast media]    Review of Systems   Review of Systems  Physical Exam Updated Vital Signs BP (!) 170/118    Pulse 62    Temp 98.6 F (37 C) (Oral)    Resp 15    SpO2 97%  Physical Exam Vitals and nursing note reviewed.  Constitutional:      General: He is not in acute distress.    Appearance: He is obese.     Comments: Pleasant well-appearing 81 year old.  In no acute distress.  Sitting comfortably in bed.  Able answer questions appropriately follow commands. No increased work of breathing. Speaking in full sentences.   HENT:      Head: Normocephalic and atraumatic.     Nose: Nose normal.     Mouth/Throat:     Mouth: Mucous membranes are dry.  Eyes:     General: No scleral icterus. Cardiovascular:     Rate and Rhythm: Normal rate and regular rhythm.     Pulses: Normal pulses.     Heart sounds: Normal heart sounds.  Pulmonary:     Effort: Pulmonary effort is normal. No respiratory distress.     Breath sounds: No wheezing.  Abdominal:     Palpations: Abdomen is soft.     Tenderness: There is no abdominal tenderness. There is no guarding or rebound.  Musculoskeletal:     Cervical back: Normal range of motion.     Right lower leg: No edema.     Left lower leg: No edema.  Skin:    General: Skin is warm and dry.     Capillary Refill: Capillary refill takes less than 2 seconds.  Neurological:     Mental Status: He is alert. Mental status is at baseline.     Comments: Alert and oriented to self, place, time and event.   Speech is fluent, clear without dysarthria or dysphasia.   Strength 5/5 in upper/lower extremities   Sensation intact in upper/lower extremities   Normal finger-to-nose and feet tapping.  CN I not tested  CN II grossly intact visual fields bilaterally. Did not visualize posterior eye.  CN III, IV, VI PERRLA and EOMs intact bilaterally  CN V Intact sensation to sharp and light touch to the face  CN VII facial movements symmetric  CN VIII not tested  CN IX, X no uvula deviation, symmetric rise of soft palate  CN XI 5/5 SCM and trapezius strength bilaterally  CN XII Midline tongue protrusion, symmetric L/R movements    Psychiatric:        Mood and Affect: Mood normal.        Behavior: Behavior normal.    ED Results / Procedures / Treatments   Labs (all labs ordered are listed, but only abnormal results are displayed) Labs Reviewed  CBC - Abnormal; Notable for the following components:      Result Value   WBC 10.8 (*)    All other components within normal limits  DIFFERENTIAL -  Abnormal; Notable for the following components:   Lymphs Abs 4.4 (*)    All other components within normal limits  COMPREHENSIVE METABOLIC PANEL - Abnormal; Notable for the following components:   Glucose, Bld 189 (*)    BUN 34 (*)    Creatinine, Ser 1.48 (*)    GFR, Estimated 48 (*)    All other components within normal limits  URINALYSIS, ROUTINE W REFLEX MICROSCOPIC - Abnormal;  Notable for the following components:   Glucose, UA 50 (*)    All other components within normal limits  I-STAT CHEM 8, ED - Abnormal; Notable for the following components:   Chloride 97 (*)    BUN 32 (*)    Creatinine, Ser 1.70 (*)    Glucose, Bld 173 (*)    All other components within normal limits  RESP PANEL BY RT-PCR (FLU A&B, COVID) ARPGX2  ETHANOL  PROTIME-INR  APTT  RAPID URINE DRUG SCREEN, HOSP PERFORMED  LACTIC ACID, PLASMA    EKG None  Radiology CT HEAD WO CONTRAST (5MM)  Result Date: 10/18/2021 CLINICAL DATA:  Headache EXAM: CT HEAD WITHOUT CONTRAST TECHNIQUE: Contiguous axial images were obtained from the base of the skull through the vertex without intravenous contrast. RADIATION DOSE REDUCTION: This exam was performed according to the departmental dose-optimization program which includes automated exposure control, adjustment of the mA and/or kV according to patient size and/or use of iterative reconstruction technique. COMPARISON:  CT head 11/19/2018 FINDINGS: Brain: No acute intracranial hemorrhage, mass effect, or herniation. No extra-axial fluid collections. No evidence of acute territorial infarct. No hydrocephalus. Mild cortical volume loss. Patchy hypodensities in the periventricular and subcortical white matter, likely secondary to chronic microvascular ischemic changes. 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 Vascular: Calcified plaques in the carotid siphons. Skull: Normal. Negative for fracture or focal lesion. Sinuses/Orbits: No acute finding. Other:  None. IMPRESSION: Chronic changes with no acute intracranial process identified. Electronically Signed   By: Ofilia Neas M.D.   On: 10/18/2021 11:46   MR BRAIN WO CONTRAST  Result Date: 10/18/2021 CLINICAL DATA:  Headache and intermittent bilateral blurry vision since Monday evening, right facial droop EXAM: MRI HEAD WITHOUT CONTRAST TECHNIQUE: Multiplanar, multiecho pulse sequences of the brain and surrounding structures were obtained without intravenous contrast. COMPARISON:  Same-day noncontrast head CT, MR head 03/14/2019 FINDINGS: Brain: There is no evidence of acute intracranial hemorrhage, extra-axial fluid collection, or acute infarct. Background parenchymal volume is normal. The ventricles are normal in size. There is a remote lacunar infarct in the right basal ganglia, unchanged. Additional patchy foci of FLAIR signal abnormality in the subcortical and periventricular white matter likely reflects sequela of chronic white matter microangiopathy. There is no mass lesion.  There is no mass effect or midline shift. Vascular: Normal flow voids. Skull and upper cervical spine: Normal marrow signal. Sinuses/Orbits: The paranasal sinuses are clear. Bilateral lens implants are in place. The globes and orbits are otherwise unremarkable. Other: None. IMPRESSION: No acute intracranial pathology. Electronically Signed   By: Valetta Mole M.D.   On: 10/18/2021 14:11    Procedures Procedures    Medications Ordered in ED Medications  sodium chloride 0.9 % bolus 1,000 mL (0 mLs Intravenous Stopped 10/18/21 1326)  ondansetron (ZOFRAN) injection 4 mg (4 mg Intravenous Given 10/18/21 1129)    ED Course/ Medical Decision Making/ A&P                           Medical Decision Making Amount and/or Complexity of Data Reviewed Labs: ordered. Radiology: ordered.  Risk Prescription drug management.   This patient presents to the ED for concern of dizziness/nausea fatigue, headache, this involves a number  of treatment options, and is a complaint that carries with it a high risk of complications and morbidity.  The differential diagnosis includes stroke, dehydration. The differential diagnosis of weakness includes but is not limited to  neurologic causes (GBS, myasthenia gravis, CVA, MS, ALS, transverse myelitis, spinal cord injury, CVA, botulism, ) and other causes: ACS, Arrhythmia, syncope, orthostatic hypotension, sepsis, hypoglycemia, electrolyte disturbance, hypothyroidism, respiratory failure, symptomatic anemia, dehydration, heat injury, polypharmacy, malignancy.    Co morbidities: Discussed in HPI   Brief History:    EMR reviewed including pt PMHx, past surgical history and past visits to ER.   See HPI for more details   Lab Tests:  I ordered and independently interpreted labs.  The pertinent results include:    Labs notable for CMP notable for mildly elevated BUN and creatinine mildly elevated but not significantly from baseline for patient.  Perhaps somewhat dehydrated will provide with 1 L normal saline and Zofran because he is feeling somewhat nauseous.  CBC with marginal elevation WBC likely due to dehydration as hemoglobin is also upper end of normal.  Labs otherwise unremarkable.  Lactic within normal limits doubt sepsis.   Imaging Studies:  NAD. I personally reviewed all imaging studies and no acute abnormality found. I agree with radiology interpretation.  I personally reviewed images of CT head    IMPRESSION:  Chronic changes with no acute intracranial process identified.    MR brain   IMPRESSION:  No acute intracranial pathology.     Cardiac Monitoring:  The patient was maintained on a cardiac monitor.  I personally viewed and interpreted the cardiac monitored which showed an underlying rhythm of: NSR EKG non-ischemic right bundle branch block   Medicines ordered:  I ordered medication including saline, Zofran for dehydration Reevaluation of the  patient after these medicines showed that the patient improved I have reviewed the patients home medicines and have made adjustments as needed   Critical Interventions:     Consults:     Reevaluation:  After the interventions noted above I re-evaluated patient and found that they have :improved   Social Determinants of Health:  The patient's social determinants of health were a factor in the care of this patient    Problem List / ED Course:  Occasional dizziness/lightheadedness.  Patient with elevated blood pressure since he has been here he has been primarily between 413-244 systolic and 01-02 diastolic.  He certainly needs to have his blood pressure monitored and followed up by PCP no need for emergent intervention at this time.  Seems that his symptoms have improved with some IV hydration.  He denies any nausea or dizziness currently.  States that he is symptom-free.  Ambulated with RN.  I discussed this case with my attending physician who cosigned this note including patient's presenting symptoms, physical exam, and planned diagnostics and interventions. Attending physician stated agreement with plan or made changes to plan which were implemented.   Attending physician assessed patient at bedside.   MRI and labs reassuring.  Recommend close follow-up with PCP.  I touch base with Dr. Mitchel Honour appreciate his collateral information on patient.   Dispostion:  After consideration of the diagnostic results and the patients response to treatment, I feel that the patent would benefit from FU w PCP.  Strict return precautions given.  No focal findings to indicate possibility of TIA.      Final Clinical Impression(s) / ED Diagnoses Final diagnoses:  Dizziness    Rx / DC Orders ED Discharge Orders     None         Tedd Sias, [place] 10/19/21 1013    Milton Ferguson, MD 10/19/21 1024

## 2021-10-18 NOTE — Patient Instructions (Addendum)
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 ?Suspected stroke.  Needs imaging and blood pressure control. ?Go to the nearest emergency department for further evaluation and treatment. ?Ischemic Stroke ?An ischemic stroke happens when part of the brain does not get enough blood. This can cause a lifelong change in how the brain works. An ischemic stroke is an emergency. It must be treated right away. ?What are the causes? ?This condition is caused by lower blood flow to part of the brain. This may be due to: ?A small clump, or clot, of blood. ?A buildup of fatty substance (plaque) in the blood vessels. ?An abnormal heart rhythm. ?A blocked or damaged artery in the head or neck. ?Infection. ?Swelling of the arteries in the brain. ?Sometimes, the cause is not known. ?What increases the risk? ?Certain medical conditions, such as: ?High blood pressure (hypertension). ?Heart disease. ?Diabetes. ?High cholesterol. ?Being very overweight. ?Sleep problems (sleep apnea). ?Other risk factors that you can change include: ?Smoking. ?Not being active. ?Heavy alcohol and drug use. ?Taking birth control pills, especially if you smoke. ?Risk factors that you cannot change include: ?Being older than age 51. ?Having had blood clots, stroke, or mini-stroke (transient ischemic attack, TIA) before. ?Having a family history of stroke. ?What are the signs or symptoms? ?Symptoms of a stroke usually happen all of a sudden. They can include: ?Weakness or a loss of feeling in your face, arm, or leg, often on one side of the body. ?Loss of balance or coordination. ?Slurred speech. ?Trouble talking or understanding what people say. ?Seeing things blurry or seeing double of one thing. ?Feeling dizzy or confused. ?Feeling like you may vomit (nausea) or vomiting. ?A very bad headache. ?If you can, write down the exact time you first had symptoms. Tell your doctor. ?If symptoms come and go, they could be caused by a mini-stroke. Get  help right away, even if you feel better. ?How is this treated? ?You must get treatment as soon as you have stroke symptoms. Some treatments work better if they are done within 3-6 hours of your first symptoms. You may get medicines that do these things: ?Take out or break up the blood clot. ?Control blood pressure. ?Thin your blood. ?Other treatments may include: ?Getting oxygen. ?Getting fluids through an IV tube. ?Procedures that make blood flow better. ?After a stroke, you may work with therapists to help you get better. ?Follow these instructions at home: ?Medicines ?Take over-the-counter and prescription medicines only as told by your doctor. ?If you were told to take a medicine to thin your blood, take it exactly as told. Take it at the same time each day. ?Taking too much of this medicine can cause bleeding. If you do not take enough medicine, it may not work as well. ?If your doctor did not prescribe medicines that have aspirin or NSAIDs, such as ibuprofen, talk to your doctor before you take any of these. ?If you are taking a blood thinner: ?Put pressure over any cuts that bleed for longer than normal. ?Tell your dentist and other doctors that you take this medicine. ?Avoid activities that could hurt or bruise you. ?Wear a medical alert bracelet or carry a card that shows you are taking blood thinners. ?Eating and drinking ?Follow instructions from your doctor about diet. ?Eat healthy foods. ?If you have trouble swallowing: ?Take small bites when eating. ?Eat foods that are soft or pureed. ?Safety ?Follow instructions from your care team about physical activity. ?Use a walker or cane  as told by your doctor. ?Keep your home safe so you do not fall. Take these steps: ?Put grab bars in the bedroom and bathroom. ?Use raised toilets and put a seat in the shower. ?Get rid of clutter and things you could trip on, such as cords or rugs. ?General instructions ?Do not smoke or use any products that contain nicotine  or tobacco. If you need help quitting, ask your doctor. ?If you drink alcohol: ?Limit how much you have to: ?0-1 drink a day for women who are not pregnant. ?0-2 drinks a day for men. ?Know how much alcohol is in your drink. In the [place] one drink equals one 12 oz bottle of beer (355 mL), one 5 oz glass of wine (148 mL), or one 1? oz glass of hard liquor (44 mL). ?Stay active. ?Keep all follow-up visits. ?How is this prevented? ?You can lower your risk of another stroke by managing these conditions: ?High blood pressure. ?High cholesterol. ?Diabetes. ?Heart disease. ?Sleep problems. ?Being overweight. ?You can also lower your risk if you: ?Quit smoking. ?Limit alcohol. ?Stay active. ?Your doctor will continue to help you with ways to prevent problems caused by a stroke. ?Get help right away if: ?You have any signs of a stroke. "BE FAST" is an easy way to remember the main warning signs: ?B - Balance. Dizziness, sudden trouble walking, or loss of balance. ?E - Eyes. Trouble seeing or a change in how you see. ?F - Face. Sudden weakness or loss of feeling of the face. The face or eyelid may droop on one side. ?A - Arms. Weakness or loss of feeling in an arm. This happens all of a sudden and most often on one side of the body. ?S - Speech. Sudden trouble speaking, slurred speech, or trouble understanding what people say. ?T - Time. Time to call emergency services. Write down what time symptoms started. ?You have other signs of a stroke, such as: ?A sudden, very bad headache with no known cause. ?Feeling like you may vomit. ?Vomiting. ?A seizure. ?These symptoms may be an emergency. Get help right away. Call your local emergency services (911 in the [place] ?Do not wait to see if the symptoms will go away. ?Do not drive yourself to the hospital. ?Summary ?An ischemic stroke happens when the brain does not get enough blood. ?Symptoms of a stroke often happen all of a sudden. ?You must get treatment as soon as you have  stroke symptoms. ?Stroke is an emergency. It must be treated right away. ?This information is not intended to replace advice given to you by your health care provider. Make sure you discuss any questions you have with your health care provider. ?Document Revised: 03/16/2020 Document Reviewed: 03/16/2020 ?[employer] Patient Education ? [place] [employer]. ? ?

## 2021-10-18 NOTE — ED Triage Notes (Addendum)
Patient reports onset of headache and intermittent bilateral blurry vision since Monday evening. Reports one episode of emesis. Appears to have R sided facial droop in triage, denies weakness. Has been taking Advil for HA. States he has trouble getting up from sitting since Monday.  ?

## 2021-10-18 NOTE — Progress Notes (Addendum)
Larry [name] 81 y.o.   Chief Complaint  Patient presents with   Headache    Dizziness, x 3 days    HISTORY OF PRESENT ILLNESS: This is a 81 y.o. male complaining of neurological symptoms that started 2 to 3 days ago. It started with visual symptoms, "like rain in your eyes", like rain water in the car windshield, followed by diffuse headache, dizziness and nausea.  Took Advil with only partial relief.  Headache still there and still a little off balance.  Blood pressure has been elevated at home and in the office today.  HPI   Prior to Admission medications   Medication Sig Start Date End Date Taking? Authorizing Provider  Accu-Chek FastClix Lancets MISC USE 1  TO CHECK GLUCOSE TWICE DAILY Patient taking differently: USE 1  TO CHECK GLUCOSE TWICE DAILY - Uses [employer] system 03/02/20  Yes Plotnikov, [name], MD  aspirin 325 MG tablet Take 1 tablet (325 mg total) by mouth daily. 11/24/18  Yes [name] D, MD  carvedilol (COREG) 12.5 MG tablet Take 12.5 mg by mouth 2 (two) times daily. 07/21/21  Yes [provider]  Cholecalciferol (VITAMIN D3) 1000 UNITS CAPS Take 1,000 Units by mouth daily.   Yes [provider]  clotrimazole (LOTRIMIN) 1 % cream Apply 1 application topically 2 (two) times daily.   Yes [provider]  Cyanocobalamin (VITAMIN B-12) 1000 MCG SUBL Place 1 tablet (1,000 mcg total) under the tongue daily. 06/20/20  Yes Plotnikov, [name], MD  fluconazole (DIFLUCAN) 100 MG tablet Take 1 tablet (100 mg total) by mouth daily. 10/04/21  Yes Plotnikov, [name], MD  fluorouracil (EFUDEX) 5 % cream Apply topically 2 (two) times daily.   Yes [provider]  folic acid (FOLVITE) 1 MG tablet Take 1 tablet by mouth once daily 03/06/21  Yes Plotnikov, [name], MD  furosemide (LASIX) 40 MG tablet Take one q am and one at hs prn swelling Patient taking differently: Take 40 mg by mouth daily. 01/20/21  Yes Plotnikov, [name], MD  glucose blood  (ACCU-CHEK SMARTVIEW) test strip USE 1 STRIP TO CHECK GLUCOSE ONCE DAILY 07/12/21  Yes Plotnikov, [name], MD  glucose blood (ACCU-CHEK SMARTVIEW) test strip USE 1 STRIP TO CHECK GLUCOSE qd prn E11.9 10/04/21  Yes Plotnikov, [name], MD  hydrALAZINE (APRESOLINE) 25 MG tablet TAKE 1 TABLET BY MOUTH THREE TIMES DAILY 08/28/21  Yes Plotnikov, [name], MD  hydrocortisone cream 1 % Apply 1 application topically 2 (two) times daily.   Yes [provider]  ketoconazole (NIZORAL) 2 % cream Apply 1 application topically 2 (two) times daily. 10/04/21 10/04/22 Yes Plotnikov, [name], MD  loratadine (CLARITIN) 10 MG tablet Take 1 tablet (10 mg total) by mouth daily. Use for 1 month then prn 09/27/20  Yes Plotnikov, [name], MD  metFORMIN (GLUCOPHAGE) 1000 MG tablet TAKE 1 TABLET BY MOUTH TWICE DAILY WITH A MEAL 04/28/21  Yes Plotnikov, [name], MD  Multiple Vitamins-Minerals (MACULAR VITAMIN BENEFIT) TABS Take 1 tablet by mouth 2 (two) times daily.   Yes [provider]  nitroGLYCERIN (NITROSTAT) 0.4 MG SL tablet Place 1 tablet (0.4 mg total) under the tongue every 5 (five) minutes as needed for chest pain. 03/27/19  Yes Plotnikov, [name], MD  PREVIDENT 5000 DRY MOUTH 1.1 % GEL dental gel  08/30/20  Yes [provider]  repaglinide (PRANDIN) 1 MG tablet TAKE 1 TABLET BY MOUTH THREE TIMES DAILY WITH MEALS 06/27/21  Yes Plotnikov, [name], MD  simvastatin (ZOCOR) 40 MG tablet TAKE 1 TABLET BY MOUTH ONCE DAILY IN THE EVENING 06/27/21  Yes Plotnikov, [name], MD  thiamine 100 MG tablet Take 1 tablet (100 mg total) by mouth daily. 06/24/19  Yes Plotnikov, [name], MD    Allergies  Allergen Reactions   Amlodipine Swelling    Leg swelling   Verapamil Swelling    Edema w/high dose   Clindamycin Hcl     UNSPECIFIED REACTION    Farxiga [Dapagliflozin]     Penile swelling, leakage   Contrast Media [Iodinated Contrast Media] Nausea And Vomiting    Patient Active Problem List    Diagnosis Date Noted   Balanitis 10/04/2021   Urinary incontinence 05/22/2021   History of colon polyps 05/22/2021   Sensorineural hearing loss (SNHL) of both ears 12/02/2020   Dysfunction of right eustachian tube 12/02/2020   Hearing loss 09/27/2020   Otitis media 09/27/2020   Low serum vitamin B12 06/20/2020   Insomnia 01/25/2020   Chronic renal insufficiency, stage 3 (moderate) ([place]) 01/25/2020   Falls 09/10/2019   Leg weakness 09/10/2019   TIA (transient ischemic attack) 06/24/2019   Hip pain, acute, left 03/24/2019   Morbid (severe) obesity with alveolar hypoventilation ([place]) 01/13/2019   Cerebral embolism with cerebral infarction 11/20/2018   Close exposure to COVID-19 virus 11/19/2018   Fatigue 03/24/2018   Chronic combined systolic and diastolic heart failure ([place]) 08/25/2017   Carotid stenosis 08/22/2017   Coronary artery disease involving coronary bypass graft of native heart with angina pectoris ([place]) 06/27/2017   DOE (dyspnea on exertion) 06/18/2017   Acute encephalopathy 11/21/2016   Sciatic radiculitis 03/23/2016   Edema 04/23/2012   OBSTRUCTIVE SLEEP APNEA 10/23/2010   LOW BACK PAIN, ACUTE 07/19/2009   DM2 (diabetes mellitus, type 2) ([place]) 06/24/2007   Dyslipidemia 06/24/2007   Essential hypertension 06/24/2007   PROSTATE CANCER, HX OF 06/24/2007    Past Medical History:  Diagnosis Date   Carotid stenosis, left    Cataracts, bilateral    removed   Coronary artery disease    Diabetes ([place])    Heart valve problem    Hyperlipemia    Prostate CA ([place])    Sleep apnea    Dr Ledora Bottcher ; uses CPAP   Stroke ([place])    Tubular adenoma of colon 02/2016   Type II or unspecified type diabetes mellitus without mention of complication, not stated as uncontrolled    Unspecified essential hypertension     Past Surgical History:  Procedure Laterality Date   APPENDECTOMY     CARDIAC CATHETERIZATION     CATARACT EXTRACTION, BILATERAL     COLONOSCOPY     CORONARY ARTERY  BYPASS GRAFT N/A 06/27/2017   Procedure: CORONARY ARTERY BYPASS GRAFTING (CABG), time four   using the left internal mammary artery, and right saphenous leg vein harvested endoscopically.;  Surgeon: Ivin Poot, MD;  Location: [facility];  Service: Open Heart Surgery;  Laterality: N/A;   CYSTOSCOPY N/A 06/27/2017   Procedure: CYSTOSCOPY FLEXIBLE, Balloon dilatation placement of foley catheter;  Surgeon: Ivin Poot, MD;  Location: [facility];  Service: Open Heart Surgery;  Laterality: N/A;   ENDARTERECTOMY Left 08/22/2017   Procedure: ENDARTERECTOMY CAROTID LEFT;  Surgeon: Serafina Mitchell, MD;  Location: [facility] OR;  Service: Vascular;  Laterality: Left;   INGUINAL HERNIA REPAIR Left    INTRAVASCULAR ULTRASOUND/IVUS N/A 06/24/2017   Procedure: Intravascular Ultrasound/IVUS;  Surgeon: Belva Crome, MD;  Location:  [facility];  Service: Cardiovascular;  Laterality: N/A;  LEFT MAIN   LEFT HEART CATH AND CORONARY ANGIOGRAPHY N/A 06/24/2017   Procedure: LEFT HEART CATH AND CORONARY ANGIOGRAPHY;  Surgeon: Belva Crome, MD;  Location: [facility];  Service: Cardiovascular;  Laterality: N/A;   PATCH ANGIOPLASTY Left 08/22/2017   Procedure: PATCH ANGIOPLASTY USING Rueben Bash BIOLOGIC PATCH;  Surgeon: Serafina Mitchell, MD;  Location: [facility] OR;  Service: Vascular;  Laterality: Left;   PROSTATECTOMY     TEE WITHOUT CARDIOVERSION N/A 06/27/2017   Procedure: TRANSESOPHAGEAL ECHOCARDIOGRAM (TEE);  Surgeon: Prescott Gum, Collier Salina, MD;  Location: [facility];  Service: Open Heart Surgery;  Laterality: N/A;   TONSILLECTOMY     ULTRASOUND GUIDANCE FOR VASCULAR ACCESS  06/24/2017   Procedure: Ultrasound Guidance For Vascular Access;  Surgeon: Belva Crome, MD;  Location: [facility];  Service: Cardiovascular;;    Social History   Socioeconomic History   Marital status: Married    Spouse name: Not on file   Number of children: 4   Years of education: Not on file   Highest education level: Bachelor's  degree (e.g., BA, AB, BS)  Occupational History   Occupation: Salesman    Comment: retired  Tobacco Use   Smoking status: Never   Smokeless tobacco: Never  Vaping Use   Vaping Use: Never used  Substance and Sexual Activity   Alcohol use: No    Alcohol/week: 0.0 standard drinks   Drug use: No   Sexual activity: Not on file  Other Topics Concern   Not on file  Social History Narrative   Regular Exercise- no, golf   Social Determinants of Health   Financial Resource Strain: Low Risk    Difficulty of Paying Living Expenses: Not hard at all  Food Insecurity: No Food Insecurity   Worried About Charity fundraiser in the Last Year: Never true   [place] in the Last Year: Never true  Transportation Needs: No Transportation Needs   Lack of Transportation (Medical): No   Lack of Transportation (Non-Medical): No  Physical Activity: Insufficiently Active   Days of Exercise per Week: 2 days   Minutes of Exercise per Session: 30 min  Stress: No Stress Concern Present   Feeling of Stress : Not at all  Social Connections: Moderately Integrated   Frequency of Communication with Friends and Family: More than three times a week   Frequency of Social Gatherings with Friends and Family: Once a week   Attends Religious Services: More than 4 times per year   Active Member of [employer] or Organizations: No   Attends Music therapist: Never   Marital Status: Married  Human resources officer Violence: Not At Risk   Fear of Current or Ex-Partner: No   Emotionally Abused: No   Physically Abused: No   Sexually Abused: No    Family History  Problem Relation Age of Onset   Heart disease Mother    Heart disease Father    Heart attack Brother    Diabetes Other    Hypertension Other    Colon cancer Neg Hx    Esophageal cancer Neg Hx    Rectal cancer Neg Hx    Stomach cancer Neg Hx      Review of Systems  Constitutional: Negative.   HENT: Negative.  Negative for congestion and  sore throat.   Eyes:  Positive for blurred vision.  Respiratory: Negative.  Negative for cough and  shortness of breath.   Cardiovascular: Negative.  Negative for chest pain and palpitations.  Gastrointestinal:  Positive for nausea. Negative for abdominal pain, diarrhea and vomiting.  Skin: Negative.  Negative for rash.  Neurological:  Positive for dizziness and headaches. Negative for speech change, focal weakness and loss of consciousness.  All other systems reviewed and are negative.  Today's Vitals   10/18/21 0930  BP: (!) 194/80  Pulse: 69  SpO2: 98%  Weight: 235 lb (106.6 kg)  Height: 5\' 11"  (1.803 m)   Body mass index is 32.78 kg/m.  Physical Exam Vitals reviewed.  Constitutional:      Appearance: He is well-developed.  HENT:     Head: Normocephalic.     Mouth/Throat:     Mouth: Mucous membranes are moist.     Pharynx: Oropharynx is clear.  Eyes:     Extraocular Movements: Extraocular movements intact.     Conjunctiva/sclera: Conjunctivae normal.     Pupils: Pupils are equal, round, and reactive to light.  Cardiovascular:     Rate and Rhythm: Normal rate and regular rhythm.     Pulses: Normal pulses.     Heart sounds: Normal heart sounds.  Pulmonary:     Effort: Pulmonary effort is normal.     Breath sounds: Normal breath sounds.  Abdominal:     Palpations: Abdomen is soft.     Tenderness: There is no abdominal tenderness.  Musculoskeletal:     Cervical back: No tenderness.     Right lower leg: No edema.     Left lower leg: No edema.  Lymphadenopathy:     Cervical: No cervical adenopathy.  Skin:    General: Skin is warm and dry.     Capillary Refill: Capillary refill takes less than 2 seconds.  Neurological:     Mental Status: He is alert and oriented to person, place, and time.     Cranial Nerves: No cranial nerve deficit.     Motor: No weakness.     Gait: Gait abnormal.  Psychiatric:        Mood and Affect: Mood normal.        Behavior: Behavior  normal.     ASSESSMENT & PLAN: Problem List Items Addressed This Visit       Cardiovascular and Mediastinum   Uncontrolled hypertension    Hypertensive urgency with CNS symptoms. Possible posterior circulation stroke. Needs further evaluation and treatment in emergency department setting.      Hypertensive urgency     Other   New daily persistent headache   Dizziness - Primary    Concerned about posterior circulation stroke. Needs diagnostic brain imaging. We will send to ER for further evaluation and treatment now.        Patient Instructions  Uncontrolled hypertension with CNS symptoms 71 to 89 days old. Suspected stroke.  Needs imaging and blood pressure control. Go to the nearest emergency department for further evaluation and treatment. Ischemic Stroke An ischemic stroke happens when part of the brain does not get enough blood. This can cause a lifelong change in how the brain works. An ischemic stroke is an emergency. It must be treated right away. What are the causes? This condition is caused by lower blood flow to part of the brain. This may be due to: A small clump, or clot, of blood. A buildup of fatty substance (plaque) in the blood vessels. An abnormal heart rhythm. A blocked or damaged artery in the head or neck. Infection. Swelling  of the arteries in the brain. Sometimes, the cause is not known. What increases the risk? Certain medical conditions, such as: High blood pressure (hypertension). Heart disease. Diabetes. High cholesterol. Being very overweight. Sleep problems (sleep apnea). Other risk factors that you can change include: Smoking. Not being active. Heavy alcohol and drug use. Taking birth control pills, especially if you smoke. Risk factors that you cannot change include: Being older than age 82. Having had blood clots, stroke, or mini-stroke (transient ischemic attack, TIA) before. Having a family history of stroke. What are the  signs or symptoms? Symptoms of a stroke usually happen all of a sudden. They can include: Weakness or a loss of feeling in your face, arm, or leg, often on one side of the body. Loss of balance or coordination. Slurred speech. Trouble talking or understanding what people say. Seeing things blurry or seeing double of one thing. Feeling dizzy or confused. Feeling like you may vomit (nausea) or vomiting. A very bad headache. If you can, write down the exact time you first had symptoms. Tell your doctor. If symptoms come and go, they could be caused by a mini-stroke. Get help right away, even if you feel better. How is this treated? You must get treatment as soon as you have stroke symptoms. Some treatments work better if they are done within 3-6 hours of your first symptoms. You may get medicines that do these things: Take out or break up the blood clot. Control blood pressure. Thin your blood. Other treatments may include: Getting oxygen. Getting fluids through an IV tube. Procedures that make blood flow better. After a stroke, you may work with therapists to help you get better. Follow these instructions at home: Medicines Take over-the-counter and prescription medicines only as told by your doctor. If you were told to take a medicine to thin your blood, take it exactly as told. Take it at the same time each day. Taking too much of this medicine can cause bleeding. If you do not take enough medicine, it may not work as well. If your doctor did not prescribe medicines that have aspirin or NSAIDs, such as ibuprofen, talk to your doctor before you take any of these. If you are taking a blood thinner: Put pressure over any cuts that bleed for longer than normal. Tell your dentist and other doctors that you take this medicine. Avoid activities that could hurt or bruise you. Wear a medical alert bracelet or carry a card that shows you are taking blood thinners. Eating and drinking Follow  instructions from your doctor about diet. Eat healthy foods. If you have trouble swallowing: Take small bites when eating. Eat foods that are soft or pureed. Safety Follow instructions from your care team about physical activity. Use a walker or cane as told by your doctor. Keep your home safe so you do not fall. Take these steps: Put grab bars in the bedroom and bathroom. Use raised toilets and put a seat in the shower. Get rid of clutter and things you could trip on, such as cords or rugs. General instructions Do not smoke or use any products that contain nicotine or tobacco. If you need help quitting, ask your doctor. If you drink alcohol: Limit how much you have to: 0-1 drink a day for women who are not pregnant. 0-2 drinks a day for men. Know how much alcohol is in your drink. In the [place] one drink equals one 12 oz bottle of beer (355 mL), one 5 oz  glass of wine (148 mL), or one 1 oz glass of hard liquor (44 mL). Stay active. Keep all follow-up visits. How is this prevented? You can lower your risk of another stroke by managing these conditions: High blood pressure. High cholesterol. Diabetes. Heart disease. Sleep problems. Being overweight. You can also lower your risk if you: Quit smoking. Limit alcohol. Stay active. Your doctor will continue to help you with ways to prevent problems caused by a stroke. Get help right away if: You have any signs of a stroke. "BE FAST" is an easy way to remember the main warning signs: B - Balance. Dizziness, sudden trouble walking, or loss of balance. E - Eyes. Trouble seeing or a change in how you see. F - Face. Sudden weakness or loss of feeling of the face. The face or eyelid may droop on one side. A - Arms. Weakness or loss of feeling in an arm. This happens all of a sudden and most often on one side of the body. S - Speech. Sudden trouble speaking, slurred speech, or trouble understanding what people say. T - Time. Time to call  emergency services. Write down what time symptoms started. You have other signs of a stroke, such as: A sudden, very bad headache with no known cause. Feeling like you may vomit. Vomiting. A seizure. These symptoms may be an emergency. Get help right away. Call your local emergency services (911 in the [place] Do not wait to see if the symptoms will go away. Do not drive yourself to the hospital. Summary An ischemic stroke happens when the brain does not get enough blood. Symptoms of a stroke often happen all of a sudden. You must get treatment as soon as you have stroke symptoms. Stroke is an emergency. It must be treated right away. This information is not intended to replace advice given to you by your health care provider. Make sure you discuss any questions you have with your health care provider. Document Revised: 03/16/2020 Document Reviewed: 03/16/2020 Elsevier Patient Education  2022 [place], MD [facility]

## 2021-10-23 ENCOUNTER — Other Ambulatory Visit: Payer: Self-pay

## 2021-10-23 ENCOUNTER — Other Ambulatory Visit: Payer: Self-pay | Admitting: Internal Medicine

## 2021-10-23 ENCOUNTER — Encounter: Payer: Self-pay | Admitting: Internal Medicine

## 2021-10-23 ENCOUNTER — Ambulatory Visit (INDEPENDENT_AMBULATORY_CARE_PROVIDER_SITE_OTHER): Payer: Medicare Other | Admitting: Internal Medicine

## 2021-10-23 VITALS — BP 126/62 | HR 67 | Temp 98.0°F | Ht 71.0 in | Wt 234.0 lb

## 2021-10-23 DIAGNOSIS — G43109 Migraine with aura, not intractable, without status migrainosus: Secondary | ICD-10-CM

## 2021-10-23 DIAGNOSIS — R0609 Other forms of dyspnea: Secondary | ICD-10-CM | POA: Diagnosis not present

## 2021-10-23 DIAGNOSIS — E1159 Type 2 diabetes mellitus with other circulatory complications: Secondary | ICD-10-CM | POA: Diagnosis not present

## 2021-10-23 DIAGNOSIS — I10 Essential (primary) hypertension: Secondary | ICD-10-CM | POA: Diagnosis not present

## 2021-10-23 MED ORDER — RIZATRIPTAN BENZOATE 10 MG PO TABS: 10.0000 mg | ORAL_TABLET | Freq: Once | ORAL | 12 refills | Status: DC | PRN

## 2021-10-23 MED ORDER — ACCU-CHEK FASTCLIX LANCETS MISC: 11 refills | Status: DC

## 2021-10-23 MED ORDER — ACCU-CHEK SMARTVIEW VI STRP: ORAL_STRIP | 3 refills | Status: DC

## 2021-10-23 MED ORDER — PROMETHAZINE HCL 25 MG PO TABS: 25.0000 mg | ORAL_TABLET | Freq: Three times a day (TID) | ORAL | 0 refills | Status: DC | PRN

## 2021-10-23 NOTE — Patient Instructions (Signed)
Migraine Headache A migraine headache is an intense, throbbing pain on one side or both sides of the head. Migraine headaches may also cause other symptoms, such as nausea, vomiting, and sensitivity to light and noise. A migraine headache can last from 4 hours to 3 days. Talk with your doctor about what things may bring on (trigger) your migraine headaches. What are the causes? The exact cause of this condition is not known. However, a migraine may be caused when nerves in the brain become irritated and release chemicals that cause inflammation of blood vessels. This inflammation causes pain. This condition may be triggered or caused by: Drinking alcohol. Smoking. Taking medicines, such as: Medicine used to treat chest pain (nitroglycerin). Birth control pills. Estrogen. Certain blood pressure medicines. Eating or drinking products that contain nitrates, glutamate, aspartame, or tyramine. Aged cheeses, chocolate, or caffeine may also be triggers. Doing physical activity. Other things that may trigger a migraine headache include: Menstruation. Pregnancy. Hunger. Stress. Lack of sleep or too much sleep. Weather changes. Fatigue. What increases the risk? The following factors may make you more likely to experience migraine headaches: Being a certain age. This condition is more common in people who are 25-55 years old. Being male. Having a family history of migraine headaches. Being Caucasian. Having a mental health condition, such as depression or anxiety. Being obese. What are the signs or symptoms? The main symptom of this condition is pulsating or throbbing pain. This pain may: Happen in any area of the head, such as on one side or both sides. Interfere with daily activities. Get worse with physical activity. Get worse with exposure to bright lights or loud noises. Other symptoms may include: Nausea. Vomiting. Dizziness. General sensitivity to bright lights, loud noises, or  smells. Before you get a migraine headache, you may get warning signs (an aura). An aura may include: Seeing flashing lights or having blind spots. Seeing bright spots, halos, or zigzag lines. Having tunnel vision or blurred vision. Having numbness or a tingling feeling. Having trouble talking. Having muscle weakness. Some people have symptoms after a migraine headache (postdromal phase), such as: Feeling tired. Difficulty concentrating. How is this diagnosed? A migraine headache can be diagnosed based on: Your symptoms. A physical exam. Tests, such as: CT scan or an MRI of the head. These imaging tests can help rule out other causes of headaches. Taking fluid from the spine (lumbar puncture) and analyzing it (cerebrospinal fluid analysis, or CSF analysis). How is this treated? This condition may be treated with medicines that: Relieve pain. Relieve nausea. Prevent migraine headaches. Treatment for this condition may also include: Acupuncture. Lifestyle changes like avoiding foods that trigger migraine headaches. Biofeedback. Cognitive behavioral therapy. Follow these instructions at home: Medicines Take over-the-counter and prescription medicines only as told by your health care provider. Ask your health care provider if the medicine prescribed to you: Requires you to avoid driving or using heavy machinery. Can cause constipation. You may need to take these actions to prevent or treat constipation: Drink enough fluid to keep your urine pale yellow. Take over-the-counter or prescription medicines. Eat foods that are high in fiber, such as beans, whole grains, and fresh fruits and vegetables. Limit foods that are high in fat and processed sugars, such as fried or sweet foods. Lifestyle Do not drink alcohol. Do not use any products that contain nicotine or tobacco, such as cigarettes, e-cigarettes, and chewing tobacco. If you need help quitting, ask your health care  provider. Get at least 8   hours of sleep every night. Find ways to manage stress, such as meditation, deep breathing, or yoga. General instructions   Keep a journal to find out what may trigger your migraine headaches. For example, write down: What you eat and drink. How much sleep you get. Any change to your diet or medicines. If you have a migraine headache: Avoid things that make your symptoms worse, such as bright lights. It may help to lie down in a dark, quiet room. Do not drive or use heavy machinery. Ask your health care provider what activities are safe for you while you are experiencing symptoms. Keep all follow-up visits as told by your health care provider. This is important. Contact a health care provider if: You develop symptoms that are different or more severe than your usual migraine headache symptoms. You have more than 15 headache days in one month. Get help right away if: Your migraine headache becomes severe. Your migraine headache lasts longer than 72 hours. You have a fever. You have a stiff neck. You have vision loss. Your muscles feel weak or like you cannot control them. You start to lose your balance often. You have trouble walking. You faint. You have a seizure. Summary A migraine headache is an intense, throbbing pain on one side or both sides of the head. Migraines may also cause other symptoms, such as nausea, vomiting, and sensitivity to light and noise. This condition may be treated with medicines and lifestyle changes. You may also need to avoid certain things that trigger a migraine headache. Keep a journal to find out what may trigger your migraine headaches. Contact your health care provider if you have more than 15 headache days in a month or you develop symptoms that are different or more severe than your usual migraine headache symptoms. This information is not intended to replace advice given to you by your health care provider. Make sure you  discuss any questions you have with your health care provider. Document Revised: 11/28/2018 Document Reviewed: 09/18/2018 Elsevier Patient Education  2022 [employer]  

## 2021-10-23 NOTE — Progress Notes (Signed)
He  Subjective:  Patient ID: Larry [name], male    DOB: 1941-02-08  Age: 81 y.o. MRN: [id number]  CC: Hospitalization Follow-up (ED on 10/18/21)   HPI Larry [name] presents for HTN, DM, edema C/o HAs - bad at times - w/aura  Outpatient Medications Prior to Visit  Medication Sig Dispense Refill   aspirin 325 MG tablet Take 1 tablet (325 mg total) by mouth daily. 60 tablet 0   carvedilol (COREG) 12.5 MG tablet Take 12.5 mg by mouth 2 (two) times daily.     Cholecalciferol (VITAMIN D3) 1000 UNITS CAPS Take 1,000 Units by mouth daily.     clotrimazole (LOTRIMIN) 1 % cream Apply 1 application topically 2 (two) times daily.     Cyanocobalamin (VITAMIN B-12) 1000 MCG SUBL Place 1 tablet (1,000 mcg total) under the tongue daily. 100 tablet 3   fluconazole (DIFLUCAN) 100 MG tablet Take 1 tablet (100 mg total) by mouth daily. 14 tablet 1   fluorouracil (EFUDEX) 5 % cream Apply topically 2 (two) times daily.     folic acid (FOLVITE) 1 MG tablet Take 1 tablet by mouth once daily 90 tablet 3   furosemide (LASIX) 40 MG tablet Take one q am and one at hs prn swelling (Patient taking differently: Take 40 mg by mouth daily.) 135 tablet 3   hydrALAZINE (APRESOLINE) 25 MG tablet TAKE 1 TABLET BY MOUTH THREE TIMES DAILY 270 tablet 0   hydrocortisone cream 1 % Apply 1 application topically 2 (two) times daily.     ketoconazole (NIZORAL) 2 % cream Apply 1 application topically 2 (two) times daily. 45 g 1   loratadine (CLARITIN) 10 MG tablet Take 1 tablet (10 mg total) by mouth daily. Use for 1 month then prn 100 tablet 3   Multiple Vitamins-Minerals (MACULAR VITAMIN BENEFIT) TABS Take 1 tablet by mouth 2 (two) times daily.     nitroGLYCERIN (NITROSTAT) 0.4 MG SL tablet Place 1 tablet (0.4 mg total) under the tongue every 5 (five) minutes as needed for chest pain. 20 tablet 3   PREVIDENT 5000 DRY MOUTH 1.1 % GEL dental gel      repaglinide (PRANDIN) 1 MG tablet TAKE 1 TABLET BY MOUTH THREE TIMES DAILY WITH  MEALS 270 tablet 3   simvastatin (ZOCOR) 40 MG tablet TAKE 1 TABLET BY MOUTH ONCE DAILY IN THE EVENING 90 tablet 3   thiamine 100 MG tablet Take 1 tablet (100 mg total) by mouth daily. 90 tablet 3   Accu-Chek FastClix Lancets MISC USE 1  TO CHECK GLUCOSE TWICE DAILY (Patient taking differently: USE 1  TO CHECK GLUCOSE TWICE DAILY - Uses [employer] system) 100 each 11   glucose blood (ACCU-CHEK SMARTVIEW) test strip USE 1 STRIP TO CHECK GLUCOSE ONCE DAILY 100 each 3   glucose blood (ACCU-CHEK SMARTVIEW) test strip USE 1 STRIP TO CHECK GLUCOSE qd prn E11.9 150 each 3   metFORMIN (GLUCOPHAGE) 1000 MG tablet TAKE 1 TABLET BY MOUTH TWICE DAILY WITH A MEAL 180 tablet 1   No facility-administered medications prior to visit.    ROS: Review of Systems  Constitutional:  Positive for fatigue. Negative for appetite change and unexpected weight change.  HENT:  Negative for congestion, nosebleeds, sneezing, sore throat and trouble swallowing.   Eyes:  Negative for itching and visual disturbance.  Respiratory:  Positive for shortness of breath. Negative for cough.   Cardiovascular:  Positive for leg swelling. Negative for chest pain and palpitations.  Gastrointestinal:  Negative for  abdominal distention, blood in stool, diarrhea and nausea.  Genitourinary:  Negative for frequency and hematuria.  Musculoskeletal:  Negative for back pain, gait problem, joint swelling and neck pain.  Skin:  Negative for rash.  Neurological:  Positive for headaches. Negative for dizziness, tremors, speech difficulty and weakness.  Psychiatric/Behavioral:  Negative for agitation, dysphoric mood and sleep disturbance. The patient is not nervous/anxious.    Objective:  BP 126/62 (BP Location: Left Arm, Patient Position: Sitting, Cuff Size: Large)    Pulse 67    Temp 98 F (36.7 C) (Oral)    Ht '5\' 11"'$  (1.803 m)    Wt 234 lb (106.1 kg)    SpO2 97%    BMI 32.64 kg/m   BP Readings from Last 3 Encounters:  10/23/21 126/62  10/18/21  (!) 170/118  10/18/21 (!) 194/80    Wt Readings from Last 3 Encounters:  10/23/21 234 lb (106.1 kg)  10/18/21 235 lb (106.6 kg)  10/04/21 243 lb (110.2 kg)    Physical Exam Constitutional:      General: He is not in acute distress.    Appearance: He is well-developed. He is obese.     Comments: NAD  Eyes:     Conjunctiva/sclera: Conjunctivae normal.     Pupils: Pupils are equal, round, and reactive to light.  Neck:     Thyroid: No thyromegaly.     Vascular: No JVD.  Cardiovascular:     Rate and Rhythm: Normal rate and regular rhythm.     Heart sounds: Normal heart sounds. No murmur heard.   No friction rub. No gallop.  Pulmonary:     Effort: Pulmonary effort is normal. No respiratory distress.     Breath sounds: Normal breath sounds. No wheezing or rales.  Chest:     Chest wall: No tenderness.  Abdominal:     General: Bowel sounds are normal. There is no distension.     Palpations: Abdomen is soft. There is no mass.     Tenderness: There is no abdominal tenderness. There is no guarding or rebound.  Musculoskeletal:        General: No tenderness. Normal range of motion.     Cervical back: Normal range of motion.     Right lower leg: Edema present.     Left lower leg: Edema present.  Lymphadenopathy:     Cervical: No cervical adenopathy.  Skin:    General: Skin is warm and dry.     Findings: No rash.  Neurological:     Mental Status: He is alert and oriented to person, place, and time.     Cranial Nerves: No cranial nerve deficit.     Motor: No abnormal muscle tone.     Coordination: Coordination normal.     Gait: Gait normal.     Deep Tendon Reflexes: Reflexes are normal and symmetric.  Psychiatric:        Behavior: Behavior normal.        Thought Content: Thought content normal.        Judgment: Judgment normal.    Lab Results  Component Value Date   WBC 10.8 (H) 10/18/2021   HGB 14.3 10/18/2021   HCT 42.0 10/18/2021   PLT 229 10/18/2021   GLUCOSE 173  (H) 10/18/2021   CHOL 125 06/16/2020   TRIG 99.0 06/16/2020   HDL 41.10 06/16/2020   LDLCALC 64 06/16/2020   ALT 13 10/18/2021   AST 16 10/18/2021   NA 138 10/18/2021   K  3.8 10/18/2021   CL 97 (L) 10/18/2021   CREATININE 1.70 (H) 10/18/2021   BUN 32 (H) 10/18/2021   CO2 28 10/18/2021   TSH 2.28 01/20/2021   PSA 0.00 Repeated and verified X2. (L) 12/26/2012   INR 1.0 10/18/2021   HGBA1C 7.0 (H) 10/04/2021    CT HEAD WO CONTRAST (5MM)  Result Date: 10/18/2021 CLINICAL DATA:  Headache EXAM: CT HEAD WITHOUT CONTRAST TECHNIQUE: Contiguous axial images were obtained from the base of the skull through the vertex without intravenous contrast. RADIATION DOSE REDUCTION: This exam was performed according to the departmental dose-optimization program which includes automated exposure control, adjustment of the mA and/or kV according to patient size and/or use of iterative reconstruction technique. COMPARISON:  CT head 11/19/2018 FINDINGS: Brain: No acute intracranial hemorrhage, mass effect, or herniation. No extra-axial fluid collections. No evidence of acute territorial infarct. No hydrocephalus. Mild cortical volume loss. Patchy hypodensities in the periventricular and subcortical white matter, likely secondary to chronic microvascular ischemic changes. 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 Vascular: Calcified plaques in the carotid siphons. Skull: Normal. Negative for fracture or focal lesion. Sinuses/Orbits: No acute finding. Other: None. IMPRESSION: Chronic changes with no acute intracranial process identified. Electronically Signed   By: Ofilia Neas M.D.   On: 10/18/2021 11:46   MR BRAIN WO CONTRAST  Result Date: 10/18/2021 CLINICAL DATA:  Headache and intermittent bilateral blurry vision since Monday evening, right facial droop EXAM: MRI HEAD WITHOUT CONTRAST TECHNIQUE: Multiplanar, multiecho pulse sequences of the brain and surrounding structures were  obtained without intravenous contrast. COMPARISON:  Same-day noncontrast head CT, MR head 03/14/2019 FINDINGS: Brain: There is no evidence of acute intracranial hemorrhage, extra-axial fluid collection, or acute infarct. Background parenchymal volume is normal. The ventricles are normal in size. There is a remote lacunar infarct in the right basal ganglia, unchanged. Additional patchy foci of FLAIR signal abnormality in the subcortical and periventricular white matter likely reflects sequela of chronic white matter microangiopathy. There is no mass lesion.  There is no mass effect or midline shift. Vascular: Normal flow voids. Skull and upper cervical spine: Normal marrow signal. Sinuses/Orbits: The paranasal sinuses are clear. Bilateral lens implants are in place. The globes and orbits are otherwise unremarkable. Other: None. IMPRESSION: No acute intracranial pathology. Electronically Signed   By: Valetta Mole M.D.   On: 10/18/2021 14:11    Assessment & Plan:   Problem List Items Addressed This Visit     DM2 (diabetes mellitus, type 2) ([place]) - Primary   Relevant Medications   glucose blood (ACCU-CHEK SMARTVIEW) test strip   DOE (dyspnea on exertion)    Chronic, multifactorial Walk more      Migraine headache with aura    HA - recurrent - complex Maxalt prn MRI was ok      Relevant Medications   rizatriptan (MAXALT) 10 MG tablet   Uncontrolled hypertension    BP Readings from Last 3 Encounters:  10/23/21 126/62  10/18/21 (!) 170/118  10/18/21 (!) 194/80           Meds ordered this encounter  Medications   glucose blood (ACCU-CHEK SMARTVIEW) test strip    Sig: USE 1 STRIP TO CHECK GLUCOSE qd prn E11.9    Dispense:  150 each    Refill:  3    Dx E11.9   rizatriptan (MAXALT) 10 MG tablet    Sig: Take 1 tablet (10 mg total) by mouth once as needed for up to 1  dose for migraine. May repeat in 2 hours if needed    Dispense:  12 tablet    Refill:  12   promethazine (PHENERGAN) 25  MG tablet    Sig: Take 1 tablet (25 mg total) by mouth every 8 (eight) hours as needed for up to 7 days for nausea or vomiting.    Dispense:  30 tablet    Refill:  0   Accu-Chek FastClix Lancets MISC    Sig: USE 1  TO CHECK GLUCOSE TWICE DAILY    Dispense:  100 each    Refill:  11      Follow-up: Return in about 3 months (around 01/23/2022) for a follow-up visit.  Walker Kehr, MD

## 2021-10-23 NOTE — Assessment & Plan Note (Signed)
BP Readings from Last 3 Encounters:  ?10/23/21 126/62  ?10/18/21 (!) 170/118  ?10/18/21 (!) 194/80  ? ? ?

## 2021-10-23 NOTE — Assessment & Plan Note (Signed)
HA - recurrent - complex ?Maxalt prn ?MRI was ok ?

## 2021-10-25 ENCOUNTER — Ambulatory Visit: Payer: Medicare Other | Admitting: Internal Medicine

## 2021-10-25 DIAGNOSIS — D1801 Hemangioma of skin and subcutaneous tissue: Secondary | ICD-10-CM | POA: Diagnosis not present

## 2021-10-25 DIAGNOSIS — L821 Other seborrheic keratosis: Secondary | ICD-10-CM | POA: Diagnosis not present

## 2021-10-25 DIAGNOSIS — D485 Neoplasm of uncertain behavior of skin: Secondary | ICD-10-CM | POA: Diagnosis not present

## 2021-10-25 DIAGNOSIS — L578 Other skin changes due to chronic exposure to nonionizing radiation: Secondary | ICD-10-CM | POA: Diagnosis not present

## 2021-10-25 DIAGNOSIS — L814 Other melanin hyperpigmentation: Secondary | ICD-10-CM | POA: Diagnosis not present

## 2021-10-25 DIAGNOSIS — L570 Actinic keratosis: Secondary | ICD-10-CM | POA: Diagnosis not present

## 2021-10-25 DIAGNOSIS — Z859 Personal history of malignant neoplasm, unspecified: Secondary | ICD-10-CM | POA: Diagnosis not present

## 2021-10-25 DIAGNOSIS — Z85828 Personal history of other malignant neoplasm of skin: Secondary | ICD-10-CM | POA: Diagnosis not present

## 2021-10-26 DIAGNOSIS — Z23 Encounter for immunization: Secondary | ICD-10-CM | POA: Diagnosis not present

## 2021-10-30 NOTE — Assessment & Plan Note (Signed)
Chronic, multifactorial ?Walk more ?

## 2021-11-02 ENCOUNTER — Telehealth: Payer: Medicare Other

## 2021-11-02 ENCOUNTER — Other Ambulatory Visit: Payer: Self-pay

## 2021-11-02 MED ORDER — ACCU-CHEK FASTCLIX LANCETS MISC: 11 refills | Status: DC

## 2021-11-02 NOTE — Progress Notes (Signed)
Received fax from [facility] stating they need new prescription sent with dx code per medicare requirements ?

## 2021-11-03 ENCOUNTER — Other Ambulatory Visit: Payer: Self-pay | Admitting: Internal Medicine

## 2021-11-03 DIAGNOSIS — E1159 Type 2 diabetes mellitus with other circulatory complications: Secondary | ICD-10-CM

## 2021-11-28 DIAGNOSIS — Z23 Encounter for immunization: Secondary | ICD-10-CM | POA: Diagnosis not present

## 2021-12-05 ENCOUNTER — Other Ambulatory Visit: Payer: Self-pay | Admitting: Internal Medicine

## 2021-12-05 ENCOUNTER — Encounter: Payer: Self-pay | Admitting: Internal Medicine

## 2021-12-05 ENCOUNTER — Ambulatory Visit (INDEPENDENT_AMBULATORY_CARE_PROVIDER_SITE_OTHER): Payer: Medicare Other | Admitting: Internal Medicine

## 2021-12-05 DIAGNOSIS — I10 Essential (primary) hypertension: Secondary | ICD-10-CM

## 2021-12-05 DIAGNOSIS — E669 Obesity, unspecified: Secondary | ICD-10-CM

## 2021-12-05 DIAGNOSIS — E662 Morbid (severe) obesity with alveolar hypoventilation: Secondary | ICD-10-CM

## 2021-12-05 DIAGNOSIS — E1159 Type 2 diabetes mellitus with other circulatory complications: Secondary | ICD-10-CM

## 2021-12-05 MED ORDER — KETOCONAZOLE 2 % EX CREA: 1.0000 "application " | TOPICAL_CREAM | Freq: Two times a day (BID) | CUTANEOUS | 1 refills | Status: DC

## 2021-12-05 MED ORDER — RYBELSUS 3 MG PO TABS: 3.0000 mg | ORAL_TABLET | Freq: Every day | ORAL | 3 refills | Status: DC

## 2021-12-05 NOTE — Assessment & Plan Note (Signed)
?  Will add Rybelsus if covered ?

## 2021-12-05 NOTE — Progress Notes (Signed)
? ?Subjective:  ?Patient ID: Larry [name], male    DOB: 09-Jun-1941  Age: 81 y.o. MRN: [id number] ? ?CC: No chief complaint on file. ? ? ?HPI ?Larry [name] presents for DM, obesity, edema ? ?Outpatient Medications Prior to Visit  ?Medication Sig Dispense Refill  ? Accu-Chek FastClix Lancets MISC USE 1  TO CHECK GLUCOSE ONCE DAILY 100 each 3  ? aspirin 325 MG tablet Take 1 tablet (325 mg total) by mouth daily. 60 tablet 0  ? carvedilol (COREG) 12.5 MG tablet Take 12.5 mg by mouth 2 (two) times daily.    ? Cholecalciferol (VITAMIN D3) 1000 UNITS CAPS Take 1,000 Units by mouth daily.    ? clotrimazole (LOTRIMIN) 1 % cream Apply 1 application topically 2 (two) times daily.    ? Cyanocobalamin (VITAMIN B-12) 1000 MCG SUBL Place 1 tablet (1,000 mcg total) under the tongue daily. 100 tablet 3  ? fluconazole (DIFLUCAN) 100 MG tablet Take 1 tablet (100 mg total) by mouth daily. 14 tablet 1  ? fluorouracil (EFUDEX) 5 % cream Apply topically 2 (two) times daily.    ? folic acid (FOLVITE) 1 MG tablet Take 1 tablet by mouth once daily 90 tablet 3  ? furosemide (LASIX) 40 MG tablet Take one q am and one at hs prn swelling (Patient taking differently: Take 40 mg by mouth daily.) 135 tablet 3  ? glucose blood (ACCU-CHEK SMARTVIEW) test strip USE 1 STRIP TO CHECK GLUCOSE qd prn E11.9 150 each 3  ? hydrALAZINE (APRESOLINE) 25 MG tablet TAKE 1 TABLET BY MOUTH THREE TIMES DAILY 270 tablet 3  ? hydrocortisone cream 1 % Apply 1 application topically 2 (two) times daily.    ? ketoconazole (NIZORAL) 2 % cream Apply 1 application topically 2 (two) times daily. 45 g 1  ? loratadine (CLARITIN) 10 MG tablet Take 1 tablet (10 mg total) by mouth daily. Use for 1 month then prn 100 tablet 3  ? metFORMIN (GLUCOPHAGE) 1000 MG tablet TAKE 1 TABLET BY MOUTH TWICE DAILY WITH A MEAL 180 tablet 3  ? Multiple Vitamins-Minerals (MACULAR VITAMIN BENEFIT) TABS Take 1 tablet by mouth 2 (two) times daily.    ? nitroGLYCERIN (NITROSTAT) 0.4 MG SL tablet  Place 1 tablet (0.4 mg total) under the tongue every 5 (five) minutes as needed for chest pain. 20 tablet 3  ? PREVIDENT 5000 DRY MOUTH 1.1 % GEL dental gel     ? repaglinide (PRANDIN) 1 MG tablet TAKE 1 TABLET BY MOUTH THREE TIMES DAILY WITH MEALS 270 tablet 3  ? rizatriptan (MAXALT) 10 MG tablet Take 1 tablet (10 mg total) by mouth once as needed for up to 1 dose for migraine. May repeat in 2 hours if needed 12 tablet 12  ? simvastatin (ZOCOR) 40 MG tablet TAKE 1 TABLET BY MOUTH ONCE DAILY IN THE EVENING 90 tablet 3  ? thiamine 100 MG tablet Take 1 tablet (100 mg total) by mouth daily. 90 tablet 3  ? promethazine (PHENERGAN) 25 MG tablet Take 1 tablet (25 mg total) by mouth every 8 (eight) hours as needed for up to 7 days for nausea or vomiting. 30 tablet 0  ? ?No facility-administered medications prior to visit.  ? ? ?ROS: ?Review of Systems  ?Constitutional:  Positive for fatigue and unexpected weight change. Negative for appetite change.  ?HENT:  Negative for congestion, nosebleeds, sneezing, sore throat and trouble swallowing.   ?Eyes:  Negative for itching and visual disturbance.  ?Respiratory:  Negative for  cough.   ?Cardiovascular:  Positive for leg swelling. Negative for chest pain and palpitations.  ?Gastrointestinal:  Negative for abdominal distention, blood in stool, diarrhea and nausea.  ?Genitourinary:  Negative for frequency and hematuria.  ?Musculoskeletal:  Positive for gait problem. Negative for back pain, joint swelling and neck pain.  ?Skin:  Negative for rash.  ?Neurological:  Negative for dizziness, tremors, speech difficulty and weakness.  ?Psychiatric/Behavioral:  Negative for agitation, dysphoric mood, sleep disturbance and suicidal ideas. The patient is not nervous/anxious.   ? ?Objective:  ?BP (!) 150/62 (BP Location: Left Arm, Patient Position: Sitting, Cuff Size: Large)   Pulse 66   Temp 97.9 ?F (36.6 ?C) (Oral)   Ht '5\' 11"'$  (1.803 m)   Wt 239 lb (108.4 kg)   SpO2 97%   BMI 33.33  kg/m?  ? ?BP Readings from Last 3 Encounters:  ?12/05/21 (!) 150/62  ?10/23/21 126/62  ?10/18/21 (!) 170/118  ? ? ?Wt Readings from Last 3 Encounters:  ?12/05/21 239 lb (108.4 kg)  ?10/23/21 234 lb (106.1 kg)  ?10/18/21 235 lb (106.6 kg)  ? ? ?Physical Exam ?Constitutional:   ?   General: He is not in acute distress. ?   Appearance: He is well-developed.  ?   Comments: NAD  ?Eyes:  ?   Conjunctiva/sclera: Conjunctivae normal.  ?   Pupils: Pupils are equal, round, and reactive to light.  ?Neck:  ?   Thyroid: No thyromegaly.  ?   Vascular: No JVD.  ?Cardiovascular:  ?   Rate and Rhythm: Normal rate and regular rhythm.  ?   Heart sounds: Normal heart sounds. No murmur heard. ?  No friction rub. No gallop.  ?Pulmonary:  ?   Effort: Pulmonary effort is normal. No respiratory distress.  ?   Breath sounds: Normal breath sounds. No wheezing or rales.  ?Chest:  ?   Chest wall: No tenderness.  ?Abdominal:  ?   General: Bowel sounds are normal. There is no distension.  ?   Palpations: Abdomen is soft. There is no mass.  ?   Tenderness: There is no abdominal tenderness. There is no guarding or rebound.  ?Musculoskeletal:     ?   General: No tenderness. Normal range of motion.  ?   Cervical back: Normal range of motion.  ?Lymphadenopathy:  ?   Cervical: No cervical adenopathy.  ?Skin: ?   General: Skin is warm and dry.  ?   Findings: No rash.  ?Neurological:  ?   Mental Status: He is alert and oriented to person, place, and time.  ?   Cranial Nerves: No cranial nerve deficit.  ?   Motor: No abnormal muscle tone.  ?   Coordination: Coordination normal.  ?   Gait: Gait normal.  ?   Deep Tendon Reflexes: Reflexes are normal and symmetric.  ?Psychiatric:     ?   Behavior: Behavior normal.     ?   Thought Content: Thought content normal.     ?   Judgment: Judgment normal.  ? ? ?Lab Results  ?Component Value Date  ? WBC 10.8 (H) 10/18/2021  ? HGB 14.3 10/18/2021  ? HCT 42.0 10/18/2021  ? PLT 229 10/18/2021  ? GLUCOSE 173 (H)  10/18/2021  ? CHOL 125 06/16/2020  ? TRIG 99.0 06/16/2020  ? HDL 41.10 06/16/2020  ? Holly 64 06/16/2020  ? ALT 13 10/18/2021  ? AST 16 10/18/2021  ? NA 138 10/18/2021  ? K 3.8 10/18/2021  ? CL  97 (L) 10/18/2021  ? CREATININE 1.70 (H) 10/18/2021  ? BUN 32 (H) 10/18/2021  ? CO2 28 10/18/2021  ? TSH 2.28 01/20/2021  ? PSA 0.00 Repeated and verified X2. (L) 12/26/2012  ? INR 1.0 10/18/2021  ? HGBA1C 7.0 (H) 10/04/2021  ? ? ?CT HEAD WO CONTRAST (5MM) ? ?Result Date: 10/18/2021 ?CLINICAL DATA:  Headache EXAM: CT HEAD WITHOUT CONTRAST TECHNIQUE: Contiguous axial images were obtained from the base of the skull through the vertex without intravenous contrast. RADIATION DOSE REDUCTION: This exam was performed according to the departmental dose-optimization program which includes automated exposure control, adjustment of the mA and/or kV according to patient size and/or use of iterative reconstruction technique. COMPARISON:  CT head 11/19/2018 FINDINGS: Brain: No acute intracranial hemorrhage, mass effect, or herniation. No extra-axial fluid collections. No evidence of acute territorial infarct. No hydrocephalus. Mild cortical volume loss. Patchy hypodensities in the periventricular and subcortical white matter, likely secondary to chronic microvascular ischemic changes. 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 Vascular: Calcified plaques in the carotid siphons. Skull: Normal. Negative for fracture or focal lesion. Sinuses/Orbits: No acute finding. Other: None. IMPRESSION: Chronic changes with no acute intracranial process identified. Electronically Signed   By: Ofilia Neas M.D.   On: 10/18/2021 11:46  ? ?MR BRAIN WO CONTRAST ? ?Result Date: 10/18/2021 ?CLINICAL DATA:  Headache and intermittent bilateral blurry vision since Monday evening, right facial droop EXAM: MRI HEAD WITHOUT CONTRAST TECHNIQUE: Multiplanar, multiecho pulse sequences of the brain and surrounding structures were  obtained without intravenous contrast. COMPARISON:  Same-day noncontrast head CT, MR head 03/14/2019 FINDINGS: Brain: There is no evidence of acute intracranial hemorrhage, extra-axial fluid collection, or acute infarct. Back

## 2021-12-05 NOTE — Assessment & Plan Note (Signed)
BMI 32 ?Will add Rybelsus if covered ?

## 2021-12-05 NOTE — Assessment & Plan Note (Signed)
Will add Rybelsus if covered ?

## 2021-12-05 NOTE — Assessment & Plan Note (Signed)
BMI 32 ?Will add Rybelsus if covered to help to loose wt ?

## 2021-12-08 ENCOUNTER — Other Ambulatory Visit: Payer: Self-pay | Admitting: Internal Medicine

## 2021-12-25 DIAGNOSIS — Z23 Encounter for immunization: Secondary | ICD-10-CM | POA: Diagnosis not present

## 2022-01-03 ENCOUNTER — Emergency Department (HOSPITAL_COMMUNITY)
Admission: EM | Admit: 2022-01-03 | Discharge: 2022-01-04 | Disposition: A | Discharge status: Home / Self Care | Payer: Medicare Other | Attending: Emergency Medicine | Admitting: Emergency Medicine

## 2022-01-03 ENCOUNTER — Encounter (HOSPITAL_COMMUNITY): Payer: Self-pay

## 2022-01-03 ENCOUNTER — Emergency Department (HOSPITAL_COMMUNITY): Payer: Medicare Other

## 2022-01-03 ENCOUNTER — Other Ambulatory Visit: Payer: Self-pay

## 2022-01-03 DIAGNOSIS — U071 COVID-19: Secondary | ICD-10-CM | POA: Insufficient documentation

## 2022-01-03 DIAGNOSIS — W19XXXA Unspecified fall, initial encounter: Secondary | ICD-10-CM | POA: Diagnosis not present

## 2022-01-03 DIAGNOSIS — S60221A Contusion of right hand, initial encounter: Secondary | ICD-10-CM

## 2022-01-03 DIAGNOSIS — W228XXA Striking against or struck by other objects, initial encounter: Secondary | ICD-10-CM | POA: Insufficient documentation

## 2022-01-03 DIAGNOSIS — S61411A Laceration without foreign body of right hand, initial encounter: Secondary | ICD-10-CM | POA: Insufficient documentation

## 2022-01-03 DIAGNOSIS — R42 Dizziness and giddiness: Secondary | ICD-10-CM | POA: Diagnosis not present

## 2022-01-03 DIAGNOSIS — R58 Hemorrhage, not elsewhere classified: Secondary | ICD-10-CM | POA: Diagnosis not present

## 2022-01-03 DIAGNOSIS — Z7982 Long term (current) use of aspirin: Secondary | ICD-10-CM | POA: Diagnosis not present

## 2022-01-03 DIAGNOSIS — R55 Syncope and collapse: Secondary | ICD-10-CM | POA: Insufficient documentation

## 2022-01-03 DIAGNOSIS — R059 Cough, unspecified: Secondary | ICD-10-CM | POA: Diagnosis not present

## 2022-01-03 DIAGNOSIS — S0093XA Contusion of unspecified part of head, initial encounter: Secondary | ICD-10-CM | POA: Diagnosis not present

## 2022-01-03 DIAGNOSIS — I499 Cardiac arrhythmia, unspecified: Secondary | ICD-10-CM | POA: Diagnosis not present

## 2022-01-03 DIAGNOSIS — S0990XA Unspecified injury of head, initial encounter: Secondary | ICD-10-CM | POA: Diagnosis not present

## 2022-01-03 LAB — CBC
HCT: 38.5 % — ABNORMAL LOW (ref 39.0–52.0)
Hemoglobin: 12.6 g/dL — ABNORMAL LOW (ref 13.0–17.0)
MCH: 30.1 pg (ref 26.0–34.0)
MCHC: 32.7 g/dL (ref 30.0–36.0)
MCV: 92.1 fL (ref 80.0–100.0)
Platelets: 168 10*3/uL (ref 150–400)
RBC: 4.18 MIL/uL — ABNORMAL LOW (ref 4.22–5.81)
RDW: 13.2 % (ref 11.5–15.5)
WBC: 8.1 10*3/uL (ref 4.0–10.5)
nRBC: 0 % (ref 0.0–0.2)

## 2022-01-03 LAB — BASIC METABOLIC PANEL
Anion gap: 4 — ABNORMAL LOW (ref 5–15)
BUN: 17 mg/dL (ref 8–23)
CO2: 27 mmol/L (ref 22–32)
Calcium: 9.1 mg/dL (ref 8.9–10.3)
Chloride: 105 mmol/L (ref 98–111)
Creatinine, Ser: 1.35 mg/dL — ABNORMAL HIGH (ref 0.61–1.24)
GFR, Estimated: 53 mL/min — ABNORMAL LOW (ref >60)
Glucose, Bld: 189 mg/dL — ABNORMAL HIGH (ref 70–99)
Potassium: 3.8 mmol/L (ref 3.5–5.1)
Sodium: 136 mmol/L (ref 135–145)

## 2022-01-03 LAB — RESP PANEL BY RT-PCR (FLU A&B, COVID) ARPGX2
Influenza A by PCR: NEGATIVE
Influenza B by PCR: NEGATIVE
SARS Coronavirus 2 by RT PCR: POSITIVE — AB

## 2022-01-03 MED ORDER — MOLNUPIRAVIR EUA 200MG CAPSULE
4.0000 | ORAL_CAPSULE | Freq: Two times a day (BID) | ORAL | 0 refills | Status: AC
Start: 2022-01-03 — End: 2022-01-08

## 2022-01-03 NOTE — Discharge Instructions (Addendum)
It was our pleasure to provide your ER care today - we hope that you feel better. ? ?Your covid test is positive. Drink plenty of fluids/stay well hydrated.  Stay active, take full and deep breaths. Take molnupiravir as prescribed.  ? ?May try cough/cold medication as need for symptom relief.  ? ?Keep skin tear area very clean. Change dressing daily. ? ?Follow up with primary care doctor in the coming week if symptoms fail to improve/resolve. ? ?Return to ER if worse, new symptoms, new/severe pain, weak/fainting, chest pain, trouble breathing, or other concern.  ?

## 2022-01-03 NOTE — ED Provider Notes (Signed)
?[facility] ?Provider Note ? ? ?CSN: [id number] ?Arrival date & time: 01/03/22  2131 ? ?  ? ?History ? ?Chief Complaint  ?Patient presents with  ? Near Syncope  ?  Near syncope while ambulating at home.   ? ? ?OSEPH [name] is a 81 y.o. male. ? ?Patient c/o near syncopal event at home tonight. States had recent uri symptoms with non prod cough and congestion, low grade fever. Tonight got up to use bathroom, urinated, and then felt faint/lightheaded on way back to bedroom. Denies any associated chest pain or discomfort. No sob or unusual doe. No palpitations. No headache. Denies loc. Did hit head on dresser, and c/o contusion/skin tear to right hand. Tetanus is up to date. Denies neck or back pain. No chest pain. No abd pain or nvd. No recent blood loss, rectal bleeding or melena. No dysuria. No other extremity pain or injury. No anticoag use.  ? ?The history is provided by the patient, medical records, a relative and the EMS personnel.  ?Near Syncope ?Pertinent negatives include no chest pain, no abdominal pain, no headaches and no shortness of breath.  ? ?  ? ?Home Medications ?Prior to Admission medications   ?Medication Sig Start Date End Date Taking? Authorizing Provider  ?molnupiravir EUA (LAGEVRIO) 200 mg CAPS capsule Take 4 capsules (800 mg total) by mouth 2 (two) times daily for 5 days. 01/03/22 01/08/22 Yes [name], MD  ?Accu-Chek FastClix Lancets MISC USE 1  TO CHECK GLUCOSE ONCE DAILY 11/12/21   Plotnikov, [name], MD  ?aspirin 325 MG tablet Take 1 tablet (325 mg total) by mouth daily. 11/24/18   [name] D, MD  ?carvedilol (COREG) 12.5 MG tablet Take 12.5 mg by mouth 2 (two) times daily. 07/21/21   [provider]  ?Cholecalciferol (VITAMIN D3) 1000 UNITS CAPS Take 1,000 Units by mouth daily.    [provider]  ?clotrimazole (LOTRIMIN) 1 % cream Apply 1 application topically 2 (two) times daily.    [provider]   ?Cyanocobalamin (VITAMIN B-12) 1000 MCG SUBL Place 1 tablet (1,000 mcg total) under the tongue daily. 06/20/20   Plotnikov, [name], MD  ?fluconazole (DIFLUCAN) 100 MG tablet Take 1 tablet by mouth once daily 12/10/21   Plotnikov, [name], MD  ?fluorouracil (EFUDEX) 5 % cream Apply topically 2 (two) times daily.    [provider]  ?folic acid (FOLVITE) 1 MG tablet Take 1 tablet by mouth once daily 03/06/21   Plotnikov, [name], MD  ?furosemide (LASIX) 40 MG tablet Take one q am and one at hs prn swelling ?Patient taking differently: Take 40 mg by mouth daily. 01/20/21   Plotnikov, [name], MD  ?glucose blood (ACCU-CHEK SMARTVIEW) test strip USE 1 STRIP TO CHECK GLUCOSE qd prn E11.9 10/23/21   Plotnikov, [name], MD  ?hydrALAZINE (APRESOLINE) 25 MG tablet TAKE 1 TABLET BY MOUTH THREE TIMES DAILY 12/05/21   Plotnikov, [name], MD  ?hydrocortisone cream 1 % Apply 1 application topically 2 (two) times daily.    [provider]  ?ketoconazole (NIZORAL) 2 % cream Apply 1 application. topically 2 (two) times daily. 12/05/21 12/05/22  Plotnikov, [name], MD  ?loratadine (CLARITIN) 10 MG tablet Take 1 tablet (10 mg total) by mouth daily. Use for 1 month then prn 09/27/20   Plotnikov, [name], MD  ?metFORMIN (GLUCOPHAGE) 1000 MG tablet TAKE 1 TABLET BY MOUTH TWICE DAILY WITH A MEAL 10/24/21   Plotnikov, [name],  MD  ?Multiple Vitamins-Minerals (MACULAR VITAMIN BENEFIT) TABS Take 1 tablet by mouth 2 (two) times daily.    [provider]  ?nitroGLYCERIN (NITROSTAT) 0.4 MG SL tablet Place 1 tablet (0.4 mg total) under the tongue every 5 (five) minutes as needed for chest pain. 03/27/19   Plotnikov, [name], MD  ?PREVIDENT 5000 DRY MOUTH 1.1 % GEL dental gel  08/30/20   [provider]  ?promethazine (PHENERGAN) 25 MG tablet Take 1 tablet (25 mg total) by mouth every 8 (eight) hours as needed for up to 7 days for nausea or vomiting. 10/23/21 10/30/21  Plotnikov, [name], MD  ?repaglinide  (PRANDIN) 1 MG tablet TAKE 1 TABLET BY MOUTH THREE TIMES DAILY WITH MEALS 06/27/21   Plotnikov, [name], MD  ?rizatriptan (MAXALT) 10 MG tablet Take 1 tablet (10 mg total) by mouth once as needed for up to 1 dose for migraine. May repeat in 2 hours if needed 10/23/21   Plotnikov, [name], MD  ?Semaglutide (RYBELSUS) 3 MG TABS Take 3 mg by mouth daily. 12/05/21   Plotnikov, [name], MD  ?simvastatin (ZOCOR) 40 MG tablet TAKE 1 TABLET BY MOUTH ONCE DAILY IN THE EVENING 06/27/21   Plotnikov, [name], MD  ?thiamine 100 MG tablet Take 1 tablet (100 mg total) by mouth daily. 06/24/19   Plotnikov, [name], MD  ?   ? ?Allergies    ?Amlodipine, Verapamil, Clindamycin hcl, Farxiga [dapagliflozin], and Contrast media [iodinated contrast media]   ? ?Review of Systems   ?Review of Systems  ?Constitutional:  Negative for fever.  ?HENT:  Positive for congestion. Negative for sore throat.   ?Eyes:  Negative for redness.  ?Respiratory:  Positive for cough. Negative for shortness of breath.   ?Cardiovascular:  Positive for near-syncope. Negative for chest pain.  ?Gastrointestinal:  Negative for abdominal pain, blood in stool, diarrhea and vomiting.  ?Genitourinary:  Negative for dysuria and flank pain.  ?Musculoskeletal:  Negative for back pain and neck pain.  ?Skin:  Negative for rash.  ?Neurological:  Negative for weakness, numbness and headaches.  ?Hematological:  Does not bruise/bleed easily.  ?Psychiatric/Behavioral:  Negative for confusion.   ? ?Physical Exam ?Updated Vital Signs ?BP (!) 160/62 (BP Location: Right Arm)   Pulse 82   Temp 98.4 ?F (36.9 ?C) (Oral)   Resp 18   Ht 1.829 m (6')   Wt 108.4 kg   SpO2 96%   BMI 32.41 kg/m?  ?Physical Exam ?Vitals and nursing note reviewed.  ?Constitutional:   ?   Appearance: Normal appearance. He is well-developed.  ?HENT:  ?   Head:  ?   Comments: Tenderness scalp.  ?   Nose: Nose normal.  ?   Mouth/Throat:  ?   Mouth: Mucous membranes are moist.  ?   Pharynx: Oropharynx is  clear.  ?Eyes:  ?   General: No scleral icterus. ?   Conjunctiva/sclera: Conjunctivae normal.  ?   Pupils: Pupils are equal, round, and reactive to light.  ?Neck:  ?   Vascular: No carotid bruit.  ?   Trachea: No tracheal deviation.  ?Cardiovascular:  ?   Rate and Rhythm: Normal rate and regular rhythm.  ?   Pulses: Normal pulses.  ?   Heart sounds: Normal heart sounds. No murmur heard. ?  No friction rub. No gallop.  ?Pulmonary:  ?   Effort: Pulmonary effort is normal. No accessory muscle usage or respiratory distress.  ?   Breath sounds: Normal breath sounds.  ?  Chest:  ?   Chest wall: No tenderness.  ?Abdominal:  ?   General: Bowel sounds are normal. There is no distension.  ?   Palpations: Abdomen is soft.  ?   Tenderness: There is no abdominal tenderness. There is no guarding.  ?   Comments: No abd pain or contusion.   ?Genitourinary: ?   Comments: No cva tenderness. ?Musculoskeletal:     ?   General: No swelling.  ?   Cervical back: Normal range of motion and neck supple. No rigidity or tenderness.  ?   Comments: Skin tear dorsum right hand, tenderness to area, otherwise good rom bil extremities without pain or other focal bony tenderness. CTLS spine, non tender, aligned, no step off. ?  ?Skin: ?   General: Skin is warm and dry.  ?   Findings: No rash.  ?Neurological:  ?   Mental Status: He is alert.  ?   Comments: Alert, speech clear. GCS 15. Motor/sens grossly intact bil.   ?Psychiatric:     ?   Mood and Affect: Mood normal.  ? ? ?ED Results / Procedures / Treatments   ?Labs ?(all labs ordered are listed, but only abnormal results are displayed) ?Results for orders placed or performed during the hospital encounter of 01/03/22  ?Resp Panel by RT-PCR (Flu A&B, Covid) Nasopharyngeal Swab  ? Specimen: Nasopharyngeal Swab; Nasopharyngeal(NP) swabs in vial transport medium  ?Result Value Ref Range  ? SARS Coronavirus 2 by RT PCR POSITIVE (A) NEGATIVE  ? Influenza A by PCR NEGATIVE NEGATIVE  ? Influenza B by PCR  NEGATIVE NEGATIVE  ?CBC  ?Result Value Ref Range  ? WBC 8.1 4.0 - 10.5 K/uL  ? RBC 4.18 (L) 4.22 - 5.81 MIL/uL  ? Hemoglobin 12.6 (L) 13.0 - 17.0 g/dL  ? HCT 38.5 (L) 39.0 - 52.0 %  ? MCV 92.1 80.0 - 100.0 fL  ? MCH 30.1 26.0 -

## 2022-01-03 NOTE — ED Triage Notes (Signed)
Near syncope at home while ambulating. Patient reports feeling warm and flushed prior to episode. Patient denies LOC. Hit left forehead on dresser. HX of head cold and taking new OTX medication this week. ?

## 2022-01-03 NOTE — ED Notes (Signed)
Patient stands and ambulates very well.  ?

## 2022-01-03 NOTE — ED Notes (Signed)
Patient to ER room 34 via EMS. Patient reports he had a near syncopal episode at home while ambulating. Patient reports he felt very warm and his legs became very weak. Patient reports sinus infection this week and taking two new OTF medications today and believing this may have contributed to his episode. Patient has small abrasion/skin tear to right hand and small abrasion to left forehead. Patient denies LOC or use of blood thinner. Call bell in reach, family at bedside.  ?

## 2022-01-19 ENCOUNTER — Encounter: Payer: Self-pay | Admitting: Internal Medicine

## 2022-01-19 ENCOUNTER — Ambulatory Visit (INDEPENDENT_AMBULATORY_CARE_PROVIDER_SITE_OTHER): Payer: Medicare Other | Admitting: Internal Medicine

## 2022-01-19 DIAGNOSIS — Z8616 Personal history of COVID-19: Secondary | ICD-10-CM | POA: Insufficient documentation

## 2022-01-19 DIAGNOSIS — H9193 Unspecified hearing loss, bilateral: Secondary | ICD-10-CM | POA: Diagnosis not present

## 2022-01-19 DIAGNOSIS — H6503 Acute serous otitis media, bilateral: Secondary | ICD-10-CM | POA: Diagnosis not present

## 2022-01-19 DIAGNOSIS — T148XXA Other injury of unspecified body region, initial encounter: Secondary | ICD-10-CM

## 2022-01-19 DIAGNOSIS — S60511A Abrasion of right hand, initial encounter: Secondary | ICD-10-CM | POA: Diagnosis not present

## 2022-01-19 DIAGNOSIS — E1159 Type 2 diabetes mellitus with other circulatory complications: Secondary | ICD-10-CM

## 2022-01-19 DIAGNOSIS — H6590 Unspecified nonsuppurative otitis media, unspecified ear: Secondary | ICD-10-CM | POA: Insufficient documentation

## 2022-01-19 MED ORDER — ACCU-CHEK SMARTVIEW VI STRP: ORAL_STRIP | 3 refills | Status: DC

## 2022-01-19 MED ORDER — CEFDINIR 300 MG PO CAPS: 300.0000 mg | ORAL_CAPSULE | Freq: Two times a day (BID) | ORAL | 0 refills | Status: DC

## 2022-01-19 NOTE — Assessment & Plan Note (Addendum)
Feeling better. Will watch

## 2022-01-19 NOTE — Patient Instructions (Signed)
Use Afrin, Flonase  Start Cefdinir if not better

## 2022-01-19 NOTE — Assessment & Plan Note (Signed)
R hand Dress daily

## 2022-01-19 NOTE — Progress Notes (Signed)
Subjective:  Patient ID: Larry [name], male    DOB: 22-Jul-1941  Age: 81 y.o. MRN: [id number]  CC: No chief complaint on file.   HPI Larry [name] presents for recent CVID19 2 wks ago C/o ear congestion, wound on hand F/u DM  Outpatient Medications Prior to Visit  Medication Sig Dispense Refill   Accu-Chek FastClix Lancets MISC USE 1  TO CHECK GLUCOSE ONCE DAILY 100 each 3   aspirin 325 MG tablet Take 1 tablet (325 mg total) by mouth daily. 60 tablet 0   carvedilol (COREG) 12.5 MG tablet Take 12.5 mg by mouth 2 (two) times daily.     Cholecalciferol (VITAMIN D3) 1000 UNITS CAPS Take 1,000 Units by mouth daily.     clotrimazole (LOTRIMIN) 1 % cream Apply 1 application topically 2 (two) times daily.     Cyanocobalamin (VITAMIN B-12) 1000 MCG SUBL Place 1 tablet (1,000 mcg total) under the tongue daily. 100 tablet 3   fluconazole (DIFLUCAN) 100 MG tablet Take 1 tablet by mouth once daily 14 tablet 0   fluorouracil (EFUDEX) 5 % cream Apply topically 2 (two) times daily.     folic acid (FOLVITE) 1 MG tablet Take 1 tablet by mouth once daily 90 tablet 3   furosemide (LASIX) 40 MG tablet Take one q am and one at hs prn swelling (Patient taking differently: Take 40 mg by mouth daily.) 135 tablet 3   hydrALAZINE (APRESOLINE) 25 MG tablet TAKE 1 TABLET BY MOUTH THREE TIMES DAILY 270 tablet 3   hydrocortisone cream 1 % Apply 1 application topically 2 (two) times daily.     ketoconazole (NIZORAL) 2 % cream Apply 1 application. topically 2 (two) times daily. 45 g 1   loratadine (CLARITIN) 10 MG tablet Take 1 tablet (10 mg total) by mouth daily. Use for 1 month then prn 100 tablet 3   metFORMIN (GLUCOPHAGE) 1000 MG tablet TAKE 1 TABLET BY MOUTH TWICE DAILY WITH A MEAL 180 tablet 3   Multiple Vitamins-Minerals (MACULAR VITAMIN BENEFIT) TABS Take 1 tablet by mouth 2 (two) times daily.     nitroGLYCERIN (NITROSTAT) 0.4 MG SL tablet Place 1 tablet (0.4 mg total) under the tongue every 5 (five)  minutes as needed for chest pain. 20 tablet 3   PREVIDENT 5000 DRY MOUTH 1.1 % GEL dental gel      repaglinide (PRANDIN) 1 MG tablet TAKE 1 TABLET BY MOUTH THREE TIMES DAILY WITH MEALS 270 tablet 3   rizatriptan (MAXALT) 10 MG tablet Take 1 tablet (10 mg total) by mouth once as needed for up to 1 dose for migraine. May repeat in 2 hours if needed 12 tablet 12   Semaglutide (RYBELSUS) 3 MG TABS Take 3 mg by mouth daily. 30 tablet 3   simvastatin (ZOCOR) 40 MG tablet TAKE 1 TABLET BY MOUTH ONCE DAILY IN THE EVENING 90 tablet 3   thiamine 100 MG tablet Take 1 tablet (100 mg total) by mouth daily. 90 tablet 3   glucose blood (ACCU-CHEK SMARTVIEW) test strip USE 1 STRIP TO CHECK GLUCOSE qd prn E11.9 150 each 3   promethazine (PHENERGAN) 25 MG tablet Take 1 tablet (25 mg total) by mouth every 8 (eight) hours as needed for up to 7 days for nausea or vomiting. 30 tablet 0   No facility-administered medications prior to visit.    ROS: Review of Systems  Constitutional:  Negative for appetite change, fatigue and unexpected weight change.  HENT:  Positive for ear  pain, hearing loss, postnasal drip and sinus pressure. Negative for congestion, nosebleeds, sneezing, sore throat and trouble swallowing.   Eyes:  Negative for itching and visual disturbance.  Respiratory:  Negative for cough.   Cardiovascular:  Negative for chest pain, palpitations and leg swelling.  Gastrointestinal:  Negative for abdominal distention, blood in stool, diarrhea and nausea.  Genitourinary:  Negative for frequency and hematuria.  Musculoskeletal:  Negative for back pain, gait problem, joint swelling and neck pain.  Skin:  Negative for rash.  Neurological:  Negative for dizziness, tremors, speech difficulty and weakness.  Psychiatric/Behavioral:  Negative for agitation, dysphoric mood and sleep disturbance. The patient is not nervous/anxious.    Objective:  BP 140/76 (BP Location: Left Arm, Patient Position: Sitting, Cuff  Size: Large)   Pulse 68   Temp 98.2 F (36.8 C) (Oral)   Ht 6' (1.829 m)   Wt 242 lb (109.8 kg)   SpO2 95%   BMI 32.82 kg/m   BP Readings from Last 3 Encounters:  01/19/22 140/76  01/04/22 133/71  12/05/21 (!) 150/62    Wt Readings from Last 3 Encounters:  01/19/22 242 lb (109.8 kg)  01/03/22 239 lb (108.4 kg)  12/05/21 239 lb (108.4 kg)    Physical Exam Constitutional:      General: He is not in acute distress.    Appearance: He is well-developed.     Comments: NAD  HENT:     Right Ear: There is no impacted cerumen.     Left Ear: There is no impacted cerumen.     Nose: Congestion present.  Eyes:     Conjunctiva/sclera: Conjunctivae normal.     Pupils: Pupils are equal, round, and reactive to light.  Neck:     Thyroid: No thyromegaly.     Vascular: No JVD.  Cardiovascular:     Rate and Rhythm: Normal rate and regular rhythm.     Heart sounds: Normal heart sounds. No murmur heard.   No friction rub. No gallop.  Pulmonary:     Effort: Pulmonary effort is normal. No respiratory distress.     Breath sounds: Normal breath sounds. No wheezing or rales.  Chest:     Chest wall: No tenderness.  Abdominal:     General: Bowel sounds are normal. There is no distension.     Palpations: Abdomen is soft. There is no mass.     Tenderness: There is no abdominal tenderness. There is no guarding or rebound.  Musculoskeletal:        General: No tenderness. Normal range of motion.     Cervical back: Normal range of motion.     Right lower leg: Edema present.     Left lower leg: Edema present.  Lymphadenopathy:     Cervical: No cervical adenopathy.  Skin:    General: Skin is warm and dry.     Findings: No lesion or rash.  Neurological:     Mental Status: He is alert and oriented to person, place, and time.     Cranial Nerves: No cranial nerve deficit.     Motor: No abnormal muscle tone.     Coordination: Coordination normal.     Gait: Gait normal.     Deep Tendon Reflexes:  Reflexes are normal and symmetric.  Psychiatric:        Behavior: Behavior normal.        Thought Content: Thought content normal.        Judgment: Judgment normal.   R hand  wound w/crust Fluid behind B TMs  Lab Results  Component Value Date   WBC 8.1 01/03/2022   HGB 12.6 (L) 01/03/2022   HCT 38.5 (L) 01/03/2022   PLT 168 01/03/2022   GLUCOSE 189 (H) 01/03/2022   CHOL 125 06/16/2020   TRIG 99.0 06/16/2020   HDL 41.10 06/16/2020   LDLCALC 64 06/16/2020   ALT 13 10/18/2021   AST 16 10/18/2021   NA 136 01/03/2022   K 3.8 01/03/2022   CL 105 01/03/2022   CREATININE 1.35 (H) 01/03/2022   BUN 17 01/03/2022   CO2 27 01/03/2022   TSH 2.28 01/20/2021   PSA 0.00 Repeated and verified X2. (L) 12/26/2012   INR 1.0 10/18/2021   HGBA1C 7.0 (H) 10/04/2021    DG Chest 2 View  Result Date: 01/03/2022 CLINICAL DATA:  Cough. EXAM: CHEST - 2 VIEW COMPARISON:  Chest x-ray 11/22/2018. FINDINGS: Sternotomy wires are present. The heart is enlarged, unchanged. Lungs are clear. There is no pleural effusion or pneumothorax. No acute fractures are seen. IMPRESSION: 1. No acute cardiopulmonary process. 2. Stable cardiomegaly. Electronically Signed   By: Ronney Asters M.D.   On: 01/03/2022 23:00   CT Head Wo Contrast  Result Date: 01/03/2022 CLINICAL DATA:  Head trauma, fell EXAM: CT HEAD WITHOUT CONTRAST TECHNIQUE: Contiguous axial images were obtained from the base of the skull through the vertex without intravenous contrast. RADIATION DOSE REDUCTION: This exam was performed according to the departmental dose-optimization program which includes automated exposure control, adjustment of the mA and/or kV according to patient size and/or use of iterative reconstruction technique. COMPARISON:  10/18/2021 FINDINGS: Brain: No acute infarct or hemorrhage. 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 Lateral ventricles and midline structures are otherwise unremarkable.  No acute extra-axial fluid collections. No mass effect. Vascular: Stable atherosclerosis.  No hyperdense vessel. Skull: Normal. Negative for fracture or focal lesion. Sinuses/Orbits: No acute finding. Other: None. IMPRESSION: 1. No acute intracranial process. Electronically Signed   By: Randa Ngo M.D.   On: 01/03/2022 23:06   DG Hand Complete Right  Result Date: 01/03/2022 CLINICAL DATA:  Cough.  Fall. EXAM: RIGHT HAND - COMPLETE 3+ VIEW COMPARISON:  None Available. FINDINGS: There is no evidence of fracture or dislocation. There is no evidence of arthropathy or other focal bone abnormality. Soft tissues are unremarkable. IMPRESSION: Negative. Electronically Signed   By: Ronney Asters M.D.   On: 01/03/2022 23:01    Assessment & Plan:   Problem List Items Addressed This Visit     Abrasion    R hand Dress daily       DM2 (diabetes mellitus, type 2) ([place])    Cont w/Metformin. Will start Rybelsus if covered       Relevant Medications   glucose blood (ACCU-CHEK SMARTVIEW) test strip   Hearing loss    Cefdinir po if not better       History of COVID-19    Feeling better. Will watch       Serous otitis media   Relevant Medications   cefdinir (OMNICEF) 300 MG capsule      Meds ordered this encounter  Medications   glucose blood (ACCU-CHEK SMARTVIEW) test strip    Sig: USE 1 STRIP TO CHECK GLUCOSE qd prn E11.9    Dispense:  150 each    Refill:  3    Dx E11.9   cefdinir (OMNICEF) 300 MG capsule    Sig: Take 1 capsule (300 mg total) by  mouth 2 (two) times daily.    Dispense:  20 capsule    Refill:  0      Follow-up: No follow-ups on file.  Walker Kehr, MD

## 2022-01-19 NOTE — Assessment & Plan Note (Signed)
Cont w/Metformin. Will start Rybelsus if covered

## 2022-01-19 NOTE — Assessment & Plan Note (Signed)
Cefdinir po if not better

## 2022-01-22 ENCOUNTER — Telehealth: Payer: Medicare Other

## 2022-01-25 ENCOUNTER — Ambulatory Visit: Payer: Medicare Other | Admitting: Internal Medicine

## 2022-02-07 ENCOUNTER — Telehealth: Payer: Self-pay | Admitting: Internal Medicine

## 2022-02-07 NOTE — Telephone Encounter (Signed)
Pt requesting Blood Test Strips  Pharmacy     [place], [place] Phone:  [phone]                Please Advise

## 2022-02-08 ENCOUNTER — Other Ambulatory Visit: Payer: Self-pay

## 2022-02-08 DIAGNOSIS — E1159 Type 2 diabetes mellitus with other circulatory complications: Secondary | ICD-10-CM

## 2022-02-08 MED ORDER — ACCU-CHEK SMARTVIEW VI STRP: ORAL_STRIP | 3 refills | Status: DC

## 2022-02-13 ENCOUNTER — Other Ambulatory Visit: Payer: Self-pay | Admitting: Internal Medicine

## 2022-02-13 DIAGNOSIS — E1159 Type 2 diabetes mellitus with other circulatory complications: Secondary | ICD-10-CM

## 2022-02-14 MED ORDER — ACCU-CHEK SMARTVIEW VI STRP: ORAL_STRIP | 3 refills | Status: DC

## 2022-02-14 NOTE — Addendum Note (Signed)
Addended by: Earnstine Regal on: 02/14/2022 08:45 AM   Modules accepted: Orders

## 2022-03-06 ENCOUNTER — Other Ambulatory Visit: Payer: Self-pay | Admitting: Internal Medicine

## 2022-03-06 ENCOUNTER — Encounter: Payer: Self-pay | Admitting: Internal Medicine

## 2022-03-06 ENCOUNTER — Ambulatory Visit (INDEPENDENT_AMBULATORY_CARE_PROVIDER_SITE_OTHER): Payer: Medicare Other | Admitting: Internal Medicine

## 2022-03-06 DIAGNOSIS — H6503 Acute serous otitis media, bilateral: Secondary | ICD-10-CM

## 2022-03-06 MED ORDER — FLUTICASONE PROPIONATE 50 MCG/ACT NA SUSP: 2.0000 | Freq: Every day | NASAL | 0 refills | Status: AC

## 2022-03-06 MED ORDER — OXYMETAZOLINE HCL 0.05 % NA SOLN: 1.0000 | Freq: Two times a day (BID) | NASAL | 0 refills | Status: AC

## 2022-03-06 MED ORDER — AMOXICILLIN-POT CLAVULANATE 875-125 MG PO TABS: 1.0000 | ORAL_TABLET | Freq: Two times a day (BID) | ORAL | 0 refills | Status: DC

## 2022-03-06 NOTE — Patient Instructions (Signed)
Use Afrin, Flonase Start Augmentin ENT referral if not better

## 2022-03-06 NOTE — Assessment & Plan Note (Signed)
Use Afrin, Flonase Start Augmentin Pt declined steroids po ENT ref if not better

## 2022-03-06 NOTE — Progress Notes (Signed)
Subjective:  Patient ID: Larry [name], male    DOB: [birth year]  Age: 81 y.o. MRN: [id number]  CC: No chief complaint on file.   HPI Larry [name] presents for B ears popping, ringing x2 months post COVID. Not better after Omnicef...  Outpatient Medications Prior to Visit  Medication Sig Dispense Refill   Accu-Chek FastClix Lancets MISC USE 1  TO CHECK GLUCOSE ONCE DAILY 100 each 3   aspirin 325 MG tablet Take 1 tablet (325 mg total) by mouth daily. 60 tablet 0   carvedilol (COREG) 12.5 MG tablet TAKE 1 TABLET BY MOUTH TWICE DAILY WITH MEALS 180 tablet 1   cefdinir (OMNICEF) 300 MG capsule Take 1 capsule (300 mg total) by mouth 2 (two) times daily. 20 capsule 0   Cholecalciferol (VITAMIN D3) 1000 UNITS CAPS Take 1,000 Units by mouth daily.     clotrimazole (LOTRIMIN) 1 % cream Apply 1 application topically 2 (two) times daily.     Cyanocobalamin (VITAMIN B-12) 1000 MCG SUBL Place 1 tablet (1,000 mcg total) under the tongue daily. 100 tablet 3   fluconazole (DIFLUCAN) 100 MG tablet Take 1 tablet by mouth once daily 14 tablet 0   fluorouracil (EFUDEX) 5 % cream Apply topically 2 (two) times daily.     folic acid (FOLVITE) 1 MG tablet Take 1 tablet by mouth once daily 90 tablet 3   furosemide (LASIX) 40 MG tablet Take one q am and one at hs prn swelling (Patient taking differently: Take 40 mg by mouth daily.) 135 tablet 3   glucose blood (ACCU-CHEK SMARTVIEW) test strip Use to check blood sugars once a day 100 each 3   hydrALAZINE (APRESOLINE) 25 MG tablet TAKE 1 TABLET BY MOUTH THREE TIMES DAILY 270 tablet 3   hydrocortisone cream 1 % Apply 1 application topically 2 (two) times daily.     ketoconazole (NIZORAL) 2 % cream Apply 1 application. topically 2 (two) times daily. 45 g 1   loratadine (CLARITIN) 10 MG tablet Take 1 tablet (10 mg total) by mouth daily. Use for 1 month then prn 100 tablet 3   metFORMIN (GLUCOPHAGE) 1000 MG tablet TAKE 1 TABLET BY MOUTH TWICE DAILY WITH A MEAL 180  tablet 3   Multiple Vitamins-Minerals (MACULAR VITAMIN BENEFIT) TABS Take 1 tablet by mouth 2 (two) times daily.     nitroGLYCERIN (NITROSTAT) 0.4 MG SL tablet Place 1 tablet (0.4 mg total) under the tongue every 5 (five) minutes as needed for chest pain. 20 tablet 3   PREVIDENT 5000 DRY MOUTH 1.1 % GEL dental gel      repaglinide (PRANDIN) 1 MG tablet TAKE 1 TABLET BY MOUTH THREE TIMES DAILY WITH MEALS 270 tablet 3   rizatriptan (MAXALT) 10 MG tablet Take 1 tablet (10 mg total) by mouth once as needed for up to 1 dose for migraine. May repeat in 2 hours if needed 12 tablet 12   Semaglutide (RYBELSUS) 3 MG TABS Take 3 mg by mouth daily. 30 tablet 3   simvastatin (ZOCOR) 40 MG tablet TAKE 1 TABLET BY MOUTH ONCE DAILY IN THE EVENING 90 tablet 3   thiamine 100 MG tablet Take 1 tablet (100 mg total) by mouth daily. 90 tablet 3   promethazine (PHENERGAN) 25 MG tablet Take 1 tablet (25 mg total) by mouth every 8 (eight) hours as needed for up to 7 days for nausea or vomiting. 30 tablet 0   No facility-administered medications prior to visit.    ROS:  Review of Systems  Constitutional:  Negative for appetite change, fatigue and unexpected weight change.  HENT:  Positive for congestion, hearing loss and sinus pressure. Negative for nosebleeds, sneezing, sore throat, trouble swallowing and voice change.   Eyes:  Negative for itching and visual disturbance.  Respiratory:  Negative for cough.   Cardiovascular:  Negative for chest pain, palpitations and leg swelling.  Gastrointestinal:  Negative for abdominal distention, blood in stool, diarrhea and nausea.  Genitourinary:  Negative for frequency and hematuria.  Musculoskeletal:  Negative for back pain, gait problem, joint swelling and neck pain.  Skin:  Negative for rash.  Neurological:  Negative for dizziness, tremors, speech difficulty and weakness.  Psychiatric/Behavioral:  Negative for agitation, dysphoric mood and sleep disturbance. The patient is  not nervous/anxious.     Objective:  BP 122/78 (BP Location: Left Arm, Patient Position: Sitting, Cuff Size: Normal)   Pulse 63   Temp 98.1 F (36.7 C) (Oral)   Ht 6' (1.829 m)   Wt 235 lb (106.6 kg)   SpO2 96%   BMI 31.87 kg/m   BP Readings from Last 3 Encounters:  03/06/22 122/78  01/19/22 140/76  01/04/22 133/71    Wt Readings from Last 3 Encounters:  03/06/22 235 lb (106.6 kg)  01/19/22 242 lb (109.8 kg)  01/03/22 239 lb (108.4 kg)    Physical Exam Constitutional:      General: He is not in acute distress.    Appearance: He is well-developed. He is obese.     Comments: NAD  HENT:     Right Ear: There is no impacted cerumen.     Left Ear: There is no impacted cerumen.     Nose: Congestion present.  Eyes:     Conjunctiva/sclera: Conjunctivae normal.     Pupils: Pupils are equal, round, and reactive to light.  Neck:     Thyroid: No thyromegaly.     Vascular: No JVD.  Cardiovascular:     Rate and Rhythm: Normal rate and regular rhythm.     Heart sounds: Normal heart sounds. No murmur heard.    No friction rub. No gallop.  Pulmonary:     Effort: Pulmonary effort is normal. No respiratory distress.     Breath sounds: Normal breath sounds. No wheezing or rales.  Chest:     Chest wall: No tenderness.  Abdominal:     General: Bowel sounds are normal. There is no distension.     Palpations: Abdomen is soft. There is no mass.     Tenderness: There is no abdominal tenderness. There is no guarding or rebound.  Musculoskeletal:        General: No tenderness. Normal range of motion.     Cervical back: Normal range of motion.     Right lower leg: Edema present.     Left lower leg: Edema present.  Lymphadenopathy:     Cervical: No cervical adenopathy.  Skin:    General: Skin is warm and dry.     Findings: No rash.  Neurological:     Mental Status: He is alert and oriented to person, place, and time.     Cranial Nerves: No cranial nerve deficit.     Motor: No  abnormal muscle tone.     Coordination: Coordination normal.     Gait: Gait normal.     Deep Tendon Reflexes: Reflexes are normal and symmetric.  Psychiatric:        Behavior: Behavior normal.  Thought Content: Thought content normal.        Judgment: Judgment normal.   TMs w/redness R>L  Lab Results  Component Value Date   WBC 8.1 01/03/2022   HGB 12.6 (L) 01/03/2022   HCT 38.5 (L) 01/03/2022   PLT 168 01/03/2022   GLUCOSE 189 (H) 01/03/2022   CHOL 125 06/16/2020   TRIG 99.0 06/16/2020   HDL 41.10 06/16/2020   LDLCALC 64 06/16/2020   ALT 13 10/18/2021   AST 16 10/18/2021   NA 136 01/03/2022   K 3.8 01/03/2022   CL 105 01/03/2022   CREATININE 1.35 (H) 01/03/2022   BUN 17 01/03/2022   CO2 27 01/03/2022   TSH 2.28 01/20/2021   PSA 0.00 Repeated and verified X2. (L) 12/26/2012   INR 1.0 10/18/2021   HGBA1C 7.0 (H) 10/04/2021    CT Head Wo Contrast  Result Date: 01/03/2022 CLINICAL DATA:  Head trauma, fell EXAM: CT HEAD WITHOUT CONTRAST TECHNIQUE: Contiguous axial images were obtained from the base of the skull through the vertex without intravenous contrast. RADIATION DOSE REDUCTION: This exam was performed according to the departmental dose-optimization program which includes automated exposure control, adjustment of the mA and/or kV according to patient size and/or use of iterative reconstruction technique. COMPARISON:  10/18/2021 FINDINGS: Brain: No acute infarct or hemorrhage. 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 Lateral ventricles and midline structures are otherwise unremarkable. No acute extra-axial fluid collections. No mass effect. Vascular: Stable atherosclerosis.  No hyperdense vessel. Skull: Normal. Negative for fracture or focal lesion. Sinuses/Orbits: No acute finding. Other: None. IMPRESSION: 1. No acute intracranial process. Electronically Signed   By: Randa Ngo M.D.   On: 01/03/2022 23:06   DG Hand  Complete Right  Result Date: 01/03/2022 CLINICAL DATA:  Cough.  Fall. EXAM: RIGHT HAND - COMPLETE 3+ VIEW COMPARISON:  None Available. FINDINGS: There is no evidence of fracture or dislocation. There is no evidence of arthropathy or other focal bone abnormality. Soft tissues are unremarkable. IMPRESSION: Negative. Electronically Signed   By: Ronney Asters M.D.   On: 01/03/2022 23:01   DG Chest 2 View  Result Date: 01/03/2022 CLINICAL DATA:  Cough. EXAM: CHEST - 2 VIEW COMPARISON:  Chest x-ray 11/22/2018. FINDINGS: Sternotomy wires are present. The heart is enlarged, unchanged. Lungs are clear. There is no pleural effusion or pneumothorax. No acute fractures are seen. IMPRESSION: 1. No acute cardiopulmonary process. 2. Stable cardiomegaly. Electronically Signed   By: Ronney Asters M.D.   On: 01/03/2022 23:00    Assessment & Plan:   Problem List Items Addressed This Visit     Serous otitis media    Use Afrin, Flonase Start Augmentin Pt declined steroids po ENT ref if not better      Relevant Medications   amoxicillin-clavulanate (AUGMENTIN) 875-125 MG tablet      Meds ordered this encounter  Medications   oxymetazoline (AFRIN 12 HOUR) 0.05 % nasal spray    Sig: Place 1 spray into both nostrils 2 (two) times daily for 10 days.    Dispense:  30 mL    Refill:  0   fluticasone (FLONASE) 50 MCG/ACT nasal spray    Sig: Place 2 sprays into both nostrils daily.    Dispense:  1 g    Refill:  0   amoxicillin-clavulanate (AUGMENTIN) 875-125 MG tablet    Sig: Take 1 tablet by mouth 2 (two) times daily.    Dispense:  20 tablet    Refill:  0      Follow-up: Return in about 3 months (around 06/06/2022) for a follow-up visit.  Walker Kehr, MD

## 2022-03-19 ENCOUNTER — Other Ambulatory Visit: Payer: Self-pay | Admitting: Internal Medicine

## 2022-03-20 DIAGNOSIS — H6592 Unspecified nonsuppurative otitis media, left ear: Secondary | ICD-10-CM | POA: Diagnosis not present

## 2022-03-20 DIAGNOSIS — H6993 Unspecified Eustachian tube disorder, bilateral: Secondary | ICD-10-CM | POA: Diagnosis not present
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
## 2022-03-22 ENCOUNTER — Encounter: Payer: Self-pay | Admitting: Interventional Cardiology

## 2022-03-22 ENCOUNTER — Ambulatory Visit (INDEPENDENT_AMBULATORY_CARE_PROVIDER_SITE_OTHER): Payer: Medicare Other | Admitting: Interventional Cardiology

## 2022-03-22 VITALS — BP 145/58 | HR 68 | Ht 72.0 in | Wt 244.0 lb

## 2022-03-22 DIAGNOSIS — R0609 Other forms of dyspnea: Secondary | ICD-10-CM

## 2022-03-22 DIAGNOSIS — E1159 Type 2 diabetes mellitus with other circulatory complications: Secondary | ICD-10-CM | POA: Diagnosis not present

## 2022-03-22 DIAGNOSIS — I10 Essential (primary) hypertension: Secondary | ICD-10-CM | POA: Diagnosis not present

## 2022-03-22 DIAGNOSIS — E785 Hyperlipidemia, unspecified: Secondary | ICD-10-CM

## 2022-03-22 DIAGNOSIS — I25709 Atherosclerosis of coronary artery bypass graft(s), unspecified, with unspecified angina pectoris: Secondary | ICD-10-CM | POA: Diagnosis not present

## 2022-03-22 NOTE — Patient Instructions (Signed)
Medication Instructions:  Your physician recommends that you continue on your current medications as directed. Please refer to the Current Medication list given to you today.  *If you need a refill on your cardiac medications before your next appointment, please call your pharmacy*  Lab Work: TODAY: BNP, CBC If you have labs (blood work) drawn today and your tests are completely normal, you will receive your results only by: [place] (if you have MyChart) OR A paper copy in the mail If you have any lab test that is abnormal or we need to change your treatment, we will call you to review the results.  Testing/Procedures: Your physician has requested that you have an echocardiogram. Echocardiography is a painless test that uses sound waves to create images of your heart. It provides your doctor with information about the size and shape of your heart and how well your heart's chambers and valves are working. This procedure takes approximately one hour. There are no restrictions for this procedure.  Follow-Up: At [facility], you and your health needs are our priority.  As part of our continuing mission to provide you with exceptional heart care, we have created designated Provider Care Teams.  These Care Teams include your primary Cardiologist (physician) and Advanced Practice Providers (APPs -  Physician Assistants and Nurse Practitioners) who all work together to provide you with the care you need, when you need it.  Your next appointment:   1 year(s)  The format for your next appointment:   In Person  Provider:   Sinclair Grooms, MD {  Important Information About [place]

## 2022-03-23 LAB — CBC
Hematocrit: 41 % (ref 37.5–51.0)
Hemoglobin: 13.1 g/dL (ref 13.0–17.7)
MCH: 29.5 pg (ref 26.6–33.0)
MCHC: 32 g/dL (ref 31.5–35.7)
MCV: 92 fL (ref 79–97)
Platelets: 219 x10E3/uL (ref 150–450)
RBC: 4.44 x10E6/uL (ref 4.14–5.80)
RDW: 12.2 % (ref 11.6–15.4)
WBC: 9.8 x10E3/uL (ref 3.4–10.8)

## 2022-03-23 LAB — PRO B NATRIURETIC PEPTIDE: NT-Pro BNP: 1268 pg/mL — ABNORMAL HIGH (ref 0–486)

## 2022-03-26 ENCOUNTER — Ambulatory Visit (HOSPITAL_COMMUNITY): Payer: Medicare Other | Attending: Cardiovascular Disease

## 2022-03-26 DIAGNOSIS — R0609 Other forms of dyspnea: Secondary | ICD-10-CM | POA: Insufficient documentation

## 2022-03-26 LAB — ECHOCARDIOGRAM COMPLETE
Area-P 1/2: 2.89 cm2
S' Lateral: 2.7 cm

## 2022-03-28 ENCOUNTER — Telehealth: Payer: Self-pay

## 2022-03-28 NOTE — Telephone Encounter (Signed)
Left message for patient to callback for lab results.  Per Dr. Tamala Julian: Let the patient know the study suggests the patient has volume overload. Start Farxiga ( Jardiance) 10 mg daily . BMET and BNP in 3 weeks.  Provided office number for callback.

## 2022-03-28 NOTE — Telephone Encounter (Signed)
-----   Message from Belva Crome, MD sent at 03/27/2022  7:32 AM EDT ----- Let the patient know the study suggests the patient has volume overload. Start Farxiga ( Jardiance) 10 mg daily . BMET and BNP in 3 weeks. A copy will be sent to Stacie Glaze, DO

## 2022-03-29 ENCOUNTER — Other Ambulatory Visit: Payer: Self-pay | Admitting: Interventional Cardiology

## 2022-03-29 NOTE — Telephone Encounter (Signed)
Larry Crome, MD  03/27/2022  7:32 AM EDT     Let the patient know the study suggests the patient has volume overload. Start Farxiga ( Jardiance) 10 mg daily . BMET and BNP in 3 weeks. A copy will be sent to Stacie Glaze, DO    Called patient back about his message. Reviewed results with patient. Patient stated he was on Farxiga for 3 weeks last time, and then had a reaction and stopped. Patient stated he had redness and soreness in pelvic area. Allergy list says penile swelling and leakage.  Patient stated he has still been getting the [place] and would not mind taking it again to see if reaction was related to something else that was going on at that time. Patient did not elaborate on the reaction or the other issues he was having. Patient stated again that he would start the [place] again. Will send message to Dr. Tamala Julian to see if he wants patient to start Wilder Glade again.

## 2022-03-29 NOTE — Telephone Encounter (Signed)
Follow Up:      Patient is returning Kristie's call from yesterday. 

## 2022-04-04 ENCOUNTER — Telehealth: Payer: Self-pay

## 2022-04-04 MED ORDER — DAPAGLIFLOZIN PROPANEDIOL 10 MG PO TABS: 10.0000 mg | ORAL_TABLET | Freq: Every day | ORAL | 11 refills | Status: DC

## 2022-04-04 NOTE — Telephone Encounter (Signed)
Returned call to patient, Dr. Tamala Julian does not recommend trying Wilder Glade again due to symptoms reports last time (penile swelling/leakage).  Patient states he has been taking [place] for the last 3 days and had a migraine in the afternoon the last 2 days. Patient states he will continue taking [place] and if migraines continue he will discontinue to medication.  Patient denies any SOB, states swelling in legs has improved on Farxiga. Patient requested callback in 2 weeks to follow-up on how he is tolerating [place] and to schedule lab appt/OV at that time.

## 2022-04-20 ENCOUNTER — Encounter: Payer: Self-pay | Admitting: Internal Medicine

## 2022-04-24 ENCOUNTER — Telehealth: Payer: Self-pay

## 2022-04-24 ENCOUNTER — Ambulatory Visit: Payer: Medicare Other | Admitting: Internal Medicine

## 2022-04-24 NOTE — Telephone Encounter (Signed)
Left message for patient to callback for update after being on Farxiga and to schedule labs and OV.  Provided office number for callback.

## 2022-04-24 NOTE — Telephone Encounter (Signed)
-----   Message from Belva Crome, MD sent at 03/27/2022  7:39 AM EDT ----- Let the patient know has diastolic HF. Start SGLt-2. Johny Drilling or [place]. BMET 2-3 weeks. OV 6-8 weeks with me. A copy will be sent to Stacie Glaze, DO

## 2022-05-02 DIAGNOSIS — L821 Other seborrheic keratosis: Secondary | ICD-10-CM | POA: Diagnosis not present

## 2022-05-02 DIAGNOSIS — Z859 Personal history of malignant neoplasm, unspecified: Secondary | ICD-10-CM | POA: Diagnosis not present

## 2022-05-02 DIAGNOSIS — D1801 Hemangioma of skin and subcutaneous tissue: Secondary | ICD-10-CM | POA: Diagnosis not present

## 2022-05-02 DIAGNOSIS — L820 Inflamed seborrheic keratosis: Secondary | ICD-10-CM | POA: Diagnosis not present

## 2022-05-02 DIAGNOSIS — L570 Actinic keratosis: Secondary | ICD-10-CM | POA: Diagnosis not present

## 2022-05-02 DIAGNOSIS — L578 Other skin changes due to chronic exposure to nonionizing radiation: Secondary | ICD-10-CM | POA: Diagnosis not present

## 2022-05-03 ENCOUNTER — Encounter: Payer: Self-pay | Admitting: Internal Medicine

## 2022-05-03 DIAGNOSIS — H35373 Puckering of macula, bilateral: Secondary | ICD-10-CM | POA: Diagnosis not present

## 2022-05-03 DIAGNOSIS — E113293 Type 2 diabetes mellitus with mild nonproliferative diabetic retinopathy without macular edema, bilateral: Secondary | ICD-10-CM | POA: Diagnosis not present

## 2022-05-03 DIAGNOSIS — H40013 Open angle with borderline findings, low risk, bilateral: Secondary | ICD-10-CM | POA: Diagnosis not present

## 2022-05-03 DIAGNOSIS — H353132 Nonexudative age-related macular degeneration, bilateral, intermediate dry stage: Secondary | ICD-10-CM | POA: Diagnosis not present

## 2022-05-03 LAB — HM DIABETES EYE EXAM

## 2022-05-07 ENCOUNTER — Telehealth: Payer: Self-pay | Admitting: Interventional Cardiology

## 2022-05-07 DIAGNOSIS — I5042 Chronic combined systolic (congestive) and diastolic (congestive) heart failure: Secondary | ICD-10-CM

## 2022-05-07 DIAGNOSIS — I10 Essential (primary) hypertension: Secondary | ICD-10-CM

## 2022-05-07 NOTE — Telephone Encounter (Signed)
Pt states he received a call to get labs done and schedule f/u after.

## 2022-05-07 NOTE — Telephone Encounter (Signed)
Returned call to patient and scheduled lab appt and OV F/U.  Patient requested f/u appt be made with Dr. Tamala Julian, next available on 07/31/22 offered, patient accepted.  Patient asked if he could have Hgb A1c drawn as well.  Will forward to Dr. Tamala Julian to see if he would like to add Hgb A1c to lab orders.

## 2022-05-08 ENCOUNTER — Other Ambulatory Visit: Payer: Self-pay | Admitting: Interventional Cardiology

## 2022-05-08 DIAGNOSIS — E1159 Type 2 diabetes mellitus with other circulatory complications: Secondary | ICD-10-CM

## 2022-05-08 DIAGNOSIS — I25709 Atherosclerosis of coronary artery bypass graft(s), unspecified, with unspecified angina pectoris: Secondary | ICD-10-CM

## 2022-05-08 NOTE — Progress Notes (Signed)
Hgb A1c added.

## 2022-05-09 ENCOUNTER — Ambulatory Visit: Payer: Medicare Other | Attending: Interventional Cardiology

## 2022-05-09 DIAGNOSIS — E1159 Type 2 diabetes mellitus with other circulatory complications: Secondary | ICD-10-CM | POA: Insufficient documentation

## 2022-05-09 DIAGNOSIS — I5042 Chronic combined systolic (congestive) and diastolic (congestive) heart failure: Secondary | ICD-10-CM | POA: Insufficient documentation

## 2022-05-09 DIAGNOSIS — I25709 Atherosclerosis of coronary artery bypass graft(s), unspecified, with unspecified angina pectoris: Secondary | ICD-10-CM | POA: Diagnosis not present

## 2022-05-09 DIAGNOSIS — I10 Essential (primary) hypertension: Secondary | ICD-10-CM | POA: Insufficient documentation

## 2022-05-09 DIAGNOSIS — H73899 Other specified disorders of tympanic membrane, unspecified ear: Secondary | ICD-10-CM | POA: Diagnosis not present

## 2022-05-09 DIAGNOSIS — Z4589 Encounter for adjustment and management of other implanted devices: Secondary | ICD-10-CM | POA: Diagnosis not present

## 2022-05-09 DIAGNOSIS — H6993 Unspecified Eustachian tube disorder, bilateral: Secondary | ICD-10-CM | POA: Diagnosis not present

## 2022-05-10 LAB — HEMOGLOBIN A1C
Est. average glucose Bld gHb Est-mCnc: 151 mg/dL
Hgb A1c MFr Bld: 6.9 % — ABNORMAL HIGH (ref 4.8–5.6)

## 2022-05-10 LAB — BASIC METABOLIC PANEL
BUN/Creatinine Ratio: 22 (ref 10–24)
BUN: 30 mg/dL — ABNORMAL HIGH (ref 8–27)
CO2: 24 mmol/L (ref 20–29)
Calcium: 9.7 mg/dL (ref 8.6–10.2)
Chloride: 101 mmol/L (ref 96–106)
Creatinine, Ser: 1.37 mg/dL — ABNORMAL HIGH (ref 0.76–1.27)
Glucose: 170 mg/dL — ABNORMAL HIGH (ref 70–99)
Potassium: 4.6 mmol/L (ref 3.5–5.2)
Sodium: 139 mmol/L (ref 134–144)
eGFR: 52 mL/min/{1.73_m2} — ABNORMAL LOW (ref >59)

## 2022-05-10 LAB — PRO B NATRIURETIC PEPTIDE: NT-Pro BNP: 485 pg/mL (ref 0–486)

## 2022-05-15 ENCOUNTER — Other Ambulatory Visit: Payer: Self-pay | Admitting: Internal Medicine

## 2022-05-15 NOTE — Telephone Encounter (Signed)
Labs done and patient aware of results.

## 2022-05-18 ENCOUNTER — Ambulatory Visit (INDEPENDENT_AMBULATORY_CARE_PROVIDER_SITE_OTHER): Payer: Medicare Other

## 2022-05-18 VITALS — Ht 72.0 in | Wt 230.0 lb

## 2022-05-18 DIAGNOSIS — Z0000 Encounter for general adult medical examination without abnormal findings: Secondary | ICD-10-CM

## 2022-05-18 NOTE — Progress Notes (Cosign Needed)
Subjective:   Larry [name] is a 81 y.o. male who presents for Medicare Annual/Subsequent preventive examination.  Virtual Visit via Telephone Note  I connected with  Larry [name] on 05/18/22 at  8:45 AM EDT by telephone and verified that I am speaking with the correct person using two identifiers.  Location: Patient: home  Provider: [facility]  Persons participating in the virtual visit: patient/Nurse Health Advisor   I discussed the limitations, risks, security and privacy concerns of performing an evaluation and management service by telephone and the availability of in person appointments. The patient expressed understanding and agreed to proceed.  Interactive audio and video telecommunications were attempted between this nurse and patient, however failed, due to patient having technical difficulties OR patient did not have access to video capability.  We continued and completed visit with audio only.  Some vital signs may be absent or patient reported.   Larry Shepherd, LPN  Review of Systems     Cardiac Risk Factors include: advanced age (>44mn, >>34women);diabetes mellitus;hypertension;male gender;dyslipidemia     Objective:    Today's Vitals   05/18/22 0854  Weight: 230 lb (104.3 kg)  Height: 6' (1.829 m)   Body mass index is 31.19 kg/m.     05/18/2022    9:00 AM 01/03/2022    9:38 PM 10/18/2021   10:37 AM 05/13/2021    8:12 AM 09/18/2019   10:33 AM 09/08/2019    2:39 PM 09/03/2019   11:47 AM  Advanced Directives  Does Patient Have a Medical Advance Directive? Yes No No No Yes Yes Yes  Type of [place]of [place] will    [place]   Does patient want to make changes to medical advance directive?    Yes (ED - Information included in AVS)  No - Patient declined   Copy of [place]an [place] Chart? No - copy requested    No - copy requested     Would patient like information on creating a medical advance directive?  No - Patient declined         Current Medications (verified) Outpatient Encounter Medications as of 05/18/2022  Medication Sig   Accu-Chek FastClix Lancets MISC USE 1  TO CHECK GLUCOSE ONCE DAILY   aspirin 325 MG tablet Take 1 tablet (325 mg total) by mouth daily.   carvedilol (COREG) 12.5 MG tablet TAKE 1 TABLET BY MOUTH TWICE DAILY WITH MEALS   Cholecalciferol (VITAMIN D3) 1000 UNITS CAPS Take 1,000 Units by mouth daily.   clotrimazole (LOTRIMIN) 1 % cream Apply 1 application topically 2 (two) times daily.   Cyanocobalamin (VITAMIN B-12) 1000 MCG SUBL Place 1 tablet (1,000 mcg total) under the tongue daily.   dapagliflozin propanediol (FARXIGA) 10 MG TABS tablet Take 1 tablet (10 mg total) by mouth daily before breakfast.   fluconazole (DIFLUCAN) 100 MG tablet Take 1 tablet by mouth once daily   fluorouracil (EFUDEX) 5 % cream Apply topically 2 (two) times daily.   folic acid (FOLVITE) 1 MG tablet Take 1 tablet by mouth once daily   furosemide (LASIX) 40 MG tablet Take 1 tablet by mouth once daily   glucose blood (ACCU-CHEK SMARTVIEW) test strip Use to check blood sugars once a day   hydrALAZINE (APRESOLINE) 25 MG tablet TAKE 1 TABLET BY MOUTH THREE TIMES DAILY   hydrocortisone cream 1 % Apply 1 application topically 2 (two) times daily.  ketoconazole (NIZORAL) 2 % cream Apply 1 application. topically 2 (two) times daily.   loratadine (CLARITIN) 10 MG tablet Take 1 tablet (10 mg total) by mouth daily. Use for 1 month then prn   metFORMIN (GLUCOPHAGE) 1000 MG tablet TAKE 1 TABLET BY MOUTH TWICE DAILY WITH A MEAL   Multiple Vitamins-Minerals (MACULAR VITAMIN BENEFIT) TABS Take 1 tablet by mouth 2 (two) times daily.   nitroGLYCERIN (NITROSTAT) 0.4 MG SL tablet Place 1 tablet (0.4 mg total) under the tongue every 5 (five) minutes as needed for chest pain.   PREVIDENT 5000 DRY MOUTH 1.1 % GEL dental gel    repaglinide  (PRANDIN) 1 MG tablet TAKE 1 TABLET BY MOUTH THREE TIMES DAILY WITH MEALS   rizatriptan (MAXALT) 10 MG tablet Take 1 tablet (10 mg total) by mouth once as needed for up to 1 dose for migraine. May repeat in 2 hours if needed   Semaglutide (RYBELSUS) 3 MG TABS Take 3 mg by mouth daily.   simvastatin (ZOCOR) 40 MG tablet TAKE 1 TABLET BY MOUTH ONCE DAILY IN THE EVENING   thiamine 100 MG tablet Take 1 tablet (100 mg total) by mouth daily.   amoxicillin-clavulanate (AUGMENTIN) 875-125 MG tablet Take 1 tablet by mouth 2 (two) times daily. (Patient not taking: Reported on 05/18/2022)   fluticasone (FLONASE) 50 MCG/ACT nasal spray Place 2 sprays into both nostrils daily.   promethazine (PHENERGAN) 25 MG tablet Take 1 tablet (25 mg total) by mouth every 8 (eight) hours as needed for up to 7 days for nausea or vomiting.   No facility-administered encounter medications on file as of 05/18/2022.    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   Type II or unspecified type diabetes mellitus without mention of complication, not stated as uncontrolled    Unspecified essential hypertension    Past Surgical History:  Procedure Laterality Date   APPENDECTOMY     CARDIAC CATHETERIZATION     CATARACT EXTRACTION, BILATERAL     COLONOSCOPY     CORONARY ARTERY BYPASS GRAFT N/A 06/27/2017   Procedure: CORONARY ARTERY BYPASS GRAFTING (CABG), time four   using the left internal mammary artery, and right saphenous leg vein harvested endoscopically.;  Surgeon: Ivin Poot, MD;  Location: [place];  Service: Open Heart  Surgery;  Laterality: N/A;   CYSTOSCOPY N/A 06/27/2017   Procedure: CYSTOSCOPY FLEXIBLE, Balloon dilatation placement of foley catheter;  Surgeon: Ivin Poot, MD;  Location: [place];  Service: Open Heart Surgery;  Laterality: N/A;   ENDARTERECTOMY Left 08/22/2017   Procedure: ENDARTERECTOMY CAROTID LEFT;  Surgeon: Serafina Mitchell, MD;  Location[name];  Service: Vascular;  Laterality: Left;   INGUINAL HERNIA REPAIR Left    INTRAVASCULAR ULTRASOUND/IVUS N/A 06/24/2017   Procedure: Intravascular Ultrasound/IVUS;  Surgeon: Belva Crome, MD;  Location: [place] CV LAB;  Service: Cardiovascular;  Laterality: N/A;  LEFT MAIN   LEFT HEART CATH AND CORONARY ANGIOGRAPHY N/A 06/24/2017   Procedure: LEFT HEART CATH AND CORONARY ANGIOGRAPHY;  Surgeon:  Belva Crome, MD;  Location: [place] [facility];  Service: Cardiovascular;  Laterality: N/A;   PATCH ANGIOPLASTY Left 08/22/2017   Procedure: PATCH ANGIOPLASTY USING Rueben Bash BIOLOGIC PATCH;  Surgeon: Serafina Mitchell, MD;  Location: [place] OR;  Service: Vascular;  Laterality: Left;   PROSTATECTOMY     TEE WITHOUT CARDIOVERSION N/A 06/27/2017   Procedure: TRANSESOPHAGEAL ECHOCARDIOGRAM (TEE);  Surgeon: Prescott Gum, Collier Salina, MD;  Location: [place];  Service: Open Heart Surgery;  Laterality: N/A;   TONSILLECTOMY     ULTRASOUND GUIDANCE FOR VASCULAR ACCESS  06/24/2017   Procedure: Ultrasound Guidance For Vascular Access;  Surgeon: Belva Crome, MD;  Location: [place] [facility];  Service: Cardiovascular;;   Family History  Problem Relation Age of Onset   Heart disease Mother    Heart disease Father    Heart attack Brother    Diabetes Other    Hypertension Other    Colon cancer Neg Hx    Esophageal cancer Neg Hx    Rectal cancer Neg Hx    Stomach cancer Neg Hx    Social History   Socioeconomic History   Marital status: Married    Spouse name: Not on file   Number of children: 4   Years of education: Not on file   Highest education level:  Bachelor's degree (e.g., BA, AB, BS)  Occupational History   Occupation: Salesman    Comment: retired  Tobacco Use   Smoking status: Never   Smokeless tobacco: Never  Vaping Use   Vaping Use: Never used  Substance and Sexual Activity   Alcohol use: No    Alcohol/week: 0.0 standard drinks of alcohol   Drug use: No   Sexual activity: Not on file  Other Topics Concern   Not on file  Social History Narrative   Regular Exercise- no, golf   Social Determinants of Health   Financial Resource Strain: Low Risk  (05/18/2022)   Overall Financial Resource Strain (CARDIA)    Difficulty of Paying Living Expenses: Not hard at all  Food Insecurity: No Food Insecurity (05/18/2022)   Hunger Vital Sign    Worried About Running Out of Food in the Last Year: Never true    Ran Out of Food in the Last Year: Never true  Transportation Needs: No Transportation Needs (05/18/2022)   PRAPARE - Hydrologist (Medical): No    Lack of Transportation (Non-Medical): No  Physical Activity: Insufficiently Active (05/18/2022)   Exercise Vital Sign    Days of Exercise per Week: 3 days    Minutes of Exercise per Session: 30 min  Stress: No Stress Concern Present (05/18/2022)   [place]    Feeling of Stress : Not at all  Social Connections: [place] (05/18/2022)   Social Connection and Isolation Panel [NHANES]    Frequency of Communication with Friends and Family: More than three times a week    Frequency of Social Gatherings with Friends and Family: More than three times a week    Attends Religious Services: More than 4 times per year    Active Member of [employer] or Organizations: Yes    Attends Music therapist: More than 4 times per year    Marital Status: Married    Tobacco Counseling Counseling given: Not Answered   Clinical Intake:  Pre-visit preparation completed: Yes  Pain :  No/denies pain     Nutritional Risks: None Diabetes: No  How often do you need to have someone help you when you read instructions, pamphlets, or other written materials from your doctor or pharmacy?: 1 - Never  Diabetic?yes  Nutrition Risk Assessment:  Has the patient had any N/V/D within the last 2 months?  No  Does the patient have any non-healing wounds?  No  Has the patient had any unintentional weight loss or weight gain?  No   Diabetes:  Is the patient diabetic?  Yes  If diabetic, was a CBG obtained today?  No  Did the patient bring in their glucometer from home?  No  How often do you monitor your CBG's? Daily .   Financial Strains and Diabetes Management:  Are you having any financial strains with the device, your supplies or your medication? No .  Does the patient want to be seen by Chronic Care Management for management of their diabetes?  No  Would the patient like to be referred to a Nutritionist or for Diabetic Management?  No   Diabetic Exams:  Diabetic Eye Exam: Completed 03/2022 Diabetic Foot Exam: Overdue, Pt has been advised about the importance in completing this exam. Pt is scheduled for diabetic foot exam on next office visit .   Interpreter Needed?: No  Information entered by :: Jadene Pierini, LPN   Activities of Daily Living    05/18/2022    9:00 AM  In your present state of health, do you have any difficulty performing the following activities:  Hearing? 0  Vision? 0  Difficulty concentrating or making decisions? 0  Walking or climbing stairs? 0  Dressing or bathing? 0  Doing errands, shopping? 0  Preparing Food and eating ? N  Using the Toilet? N  In the past six months, have you accidently leaked urine? N  Do you have problems with loss of bowel control? N  Managing your Medications? N  Managing your Finances? N  Housekeeping or managing your Housekeeping? N    Patient Care Team: Plotnikov, [name], MD as PCP - General (Internal  Medicine) Belva Crome, MD as PCP - Cardiology (Cardiology) Sueanne Margarita, MD as PCP - Sleep Medicine (Cardiology) Kathie Rhodes, MD (Inactive) as Consulting Physician (Urology) Kristeen Miss, MD as Consulting Physician (Neurosurgery) Szabat, Darnelle Maffucci, [facility] (Inactive) as Pharmacist (Pharmacist)  Indicate any recent Medical Services you may have received from other than [facility] providers in the past year (date may be approximate).     Assessment:   This is a routine wellness examination for [place].  Hearing/Vision screen Vision Screening - Comments:: Annual eye exam wears glasses   Dietary issues and exercise activities discussed: Current Exercise Habits: Home exercise routine, Type of exercise: walking, Time (Minutes): 30, Frequency (Times/Week): 3, Weekly Exercise (Minutes/Week): 90, Intensity: Mild, Exercise limited by: None identified   Goals Addressed             This Visit's Progress    Exercise 3x per week (30 min per time)         Depression Screen    05/18/2022    8:59 AM 10/23/2021    3:14 PM 10/18/2021    9:28 AM 05/13/2021    8:07 AM 09/15/2020   10:16 AM 03/24/2019    9:12 AM 03/10/2018    1:25 PM  PHQ 2/9 Scores  PHQ - 2 Score 0 0 0 0 0 0 0    Fall Risk    05/18/2022    8:56 AM 10/23/2021    3:14 PM 10/18/2021  9:28 AM 05/13/2021    8:07 AM 01/20/2021    9:53 AM  Fall Risk   Falls in the past year? 0 0 0 0 0  Number falls in past yr: 0 0 0 0 0  Injury with Fall? 0 0 0 0 0  Risk for fall due to : No Fall Risks      Follow up Falls prevention discussed        FALL RISK PREVENTION PERTAINING TO THE HOME:  Any stairs in or around the home? Yes  If so, are there any without handrails? No  Home free of loose throw rugs in walkways, pet beds, electrical cords, etc? Yes  Adequate lighting in your home to reduce risk of falls? Yes   ASSISTIVE DEVICES UTILIZED TO PREVENT FALLS:  Life alert? No  Use of a cane, walker or w/c? No  Grab bars in the bathroom? Yes   Shower chair or bench in shower? No  Elevated toilet seat or a handicapped toilet? No        05/18/2022    9:01 AM 05/13/2021    8:13 AM  6CIT Screen  What Year? 0 points 0 points  What month? 0 points 0 points  What time? 0 points 0 points  Count back from 20 0 points 0 points  Months in reverse 0 points 0 points  Repeat phrase 2 points 0 points  Total Score 2 points 0 points    Immunizations Immunization History  Administered Date(s) Administered   Fluad Quad(high Dose 65+) 06/24/2019, 06/15/2020, 05/22/2021   Influenza Split 07/13/2011   Influenza Whole 05/30/2006, 05/27/2008, 07/19/2009, 08/24/2010   Influenza, High Dose Seasonal PF 08/07/2016, 06/18/2017, 06/25/2018   Influenza, Seasonal, Injecte, Preservative Fre 08/27/2012   Influenza,inj,Quad PF,6+ Mos 04/29/2013, 04/07/2014   Pneumococcal Conjugate-13 07/13/2019   Pneumococcal Polysaccharide-23 07/13/2011   Tdap 09/10/2019    TDAP status: Up to date  Flu Vaccine status: Due, Education has been provided regarding the importance of this vaccine. Advised may receive this vaccine at local pharmacy or Health Dept. Aware to provide a copy of the vaccination record if obtained from local pharmacy or Health Dept. Verbalized acceptance and understanding.  Pneumococcal vaccine status: Up to date  Covid-19 vaccine status: Completed vaccines  Qualifies for Shingles Vaccine? Yes   Zostavax completed No   Shingrix Completed?: No.    Education has been provided regarding the importance of this vaccine. Patient has been advised to call insurance company to determine out of pocket expense if they have not yet received this vaccine. Advised may also receive vaccine at local pharmacy or Health Dept. Verbalized acceptance and understanding.  Screening Tests Health Maintenance  Topic Date Due   COVID-19 Vaccine (1) Never done   Diabetic kidney evaluation - Urine ACR  Never done   Zoster Vaccines- Shingrix (1 of 2) Never done    FOOT EXAM  01/20/2022   INFLUENZA VACCINE  03/20/2022   HEMOGLOBIN A1C  11/07/2022   OPHTHALMOLOGY EXAM  05/04/2023   Diabetic kidney evaluation - GFR measurement  05/10/2023   TETANUS/TDAP  09/09/2029   Pneumonia Vaccine 48+ Years old  Completed   HPV VACCINES  Aged Out   COLONOSCOPY (Pts 45-53yr Insurance coverage will need to be confirmed)  Discontinued    Health Maintenance  Health Maintenance Due  Topic Date Due   COVID-19 Vaccine (1) Never done   Diabetic kidney evaluation - Urine ACR  Never done   Zoster Vaccines- Shingrix (1 of 2) Never  done   FOOT EXAM  01/20/2022   INFLUENZA VACCINE  03/20/2022    Colorectal cancer screening: No longer required.   Lung Cancer Screening: (Low Dose CT Chest recommended if Age 74-80 years, 30 pack-year currently smoking OR have quit w/in 15years.) does not qualify.   Lung Cancer Screening Referral: n/a  Additional Screening:  Hepatitis C Screening: does not qualify;  Vision Screening: Recommended annual ophthalmology exams for early detection of glaucoma and other disorders of the eye. Is the patient up to date with their annual eye exam?  Yes  Who is the provider or what is the name of the office in which the patient attends annual eye exams? Dr.Hecker  If pt is not established with a provider, would they like to be referred to a provider to establish care? No .   Dental Screening: Recommended annual dental exams for proper oral hygiene  Community Resource Referral / Chronic Care Management: CRR required this visit?  No   CCM required this visit?  No      Plan:     I have personally reviewed and noted the following in the patient's chart:   Medical and social history Use of alcohol, tobacco or illicit drugs  Current medications and supplements including opioid prescriptions. Patient is not currently taking opioid prescriptions. Functional ability and status Nutritional status Physical activity Advanced  directives List of other physicians Hospitalizations, surgeries, and ER visits in previous 12 months Vitals Screenings to include cognitive, depression, and falls Referrals and appointments  In addition, I have reviewed and discussed with patient certain preventive protocols, quality metrics, and best practice recommendations. A written personalized care plan for preventive services as well as general preventive health recommendations were provided to patient.     Larry Shepherd, LPN   3/82/5053   Nurse Notes: Due flu vaccine   Medical screening examination/treatment/procedure(s) were performed by non-physician practitioner and as supervising physician I was immediately available for consultation/collaboration.  I agree with above. Lew Dawes, MD

## 2022-05-18 NOTE — Patient Instructions (Signed)
Larry [name] , Thank you for taking time to come for your Medicare Wellness Visit. I appreciate your ongoing commitment to your health goals. Please review the following plan we discussed and let me know if I can assist you in the future.   These are the goals we discussed:  Goals      Exercise 3x per week (30 min per time)     Monitor and Manage My Blood Sugar-Diabetes Type 2     Timeframe:  Long-Range Goal Priority:  High Start Date:   05/11/2021                          Expected End Date:  07/11/2022                     Follow Up Date March 2023   - check blood sugar at prescribed times - check blood sugar if I feel it is too high or too low - enter blood sugar readings and medication or insulin into daily log - take the blood sugar log to all doctor visits - take the blood sugar meter to all doctor visits    Why is this important?   Checking your blood sugar at home helps to keep it from getting very high or very low.  Writing the results in a diary or log helps the doctor know how to care for you.  Your blood sugar log should have the time, date and the results.  Also, write down the amount of insulin or other medicine that you take.  Other information, like what you ate, exercise done and how you were feeling, will also be helpful.       Track and Manage Fluids and Swelling-Heart Failure     Timeframe:  Long-Range Goal Priority:  High Start Date:   05/11/2021                          Expected End Date:  07/11/2022                     Follow Up Date March 2023   - call office if I gain more than 3 pounds in one day or 5 pounds in one week - track weight in diary - use salt in moderation - watch for swelling in feet, ankles and legs every day - weigh myself daily    Why is this important?   It is important to check your weight daily and watch how much salt and liquids you have.  It will help you to manage your heart failure.       Track and Manage My Blood  Pressure-Hypertension/ CHF     Timeframe:  Long-Range Goal Priority:  High Start Date:   05/11/2021                          Expected End Date:  07/11/2022                     Follow Up Date March 2023   - check blood pressure daily - choose a place to take my blood pressure (home, clinic or office, retail store) - write blood pressure results in a log or diary    Why is this important?   You won't feel high blood pressure, but it can still hurt your blood vessels.  High blood pressure can cause heart or kidney problems. It can also cause a stroke.  Making lifestyle changes like losing a little weight or eating less salt will help.  Checking your blood pressure at home and at different times of the day can help to control blood pressure.  If the doctor prescribes medicine remember to take it the way the doctor ordered.  Call the office if you cannot afford the medicine or if there are questions about it.          This is a list of the screening recommended for you and due dates:  Health Maintenance  Topic Date Due   COVID-19 Vaccine (1) Never done   Yearly kidney health urinalysis for diabetes  Never done   Zoster (Shingles) Vaccine (1 of 2) Never done   Complete foot exam   01/20/2022   Flu Shot  03/20/2022   Hemoglobin A1C  11/07/2022   Eye exam for diabetics  05/04/2023   Yearly kidney function blood test for diabetes  05/10/2023   Tetanus Vaccine  09/09/2029   Pneumonia Vaccine  Completed   HPV Vaccine  Aged Out   Colon Cancer Screening  Discontinued    Advanced directives: Please bring a copy of your health care power of attorney and living will to the office to be added to your chart at your convenience.   Conditions/risks identified: Aim for 30 minutes of exercise or brisk walking, 6-8 glasses of water, and 5 servings of fruits and vegetables each day.   Next appointment: Follow up in one year for your annual wellness visit.   Preventive Care 38 Years and Older,  Male  Preventive care refers to lifestyle choices and visits with your health care provider that can promote health and wellness. What does preventive care include? A yearly physical exam. This is also called an annual well check. Dental exams once or twice a year. Routine eye exams. Ask your health care provider how often you should have your eyes checked. Personal lifestyle choices, including: Daily care of your teeth and gums. Regular physical activity. Eating a healthy diet. Avoiding tobacco and drug use. Limiting alcohol use. Practicing safe sex. Taking low doses of aspirin every day. Taking vitamin and mineral supplements as recommended by your health care provider. What happens during an annual well check? The services and screenings done by your health care provider during your annual well check will depend on your age, overall health, lifestyle risk factors, and family history of disease. Counseling  Your health care provider may ask you questions about your: Alcohol use. Tobacco use. Drug use. Emotional well-being. Home and relationship well-being. Sexual activity. Eating habits. History of falls. Memory and ability to understand (cognition). Work and work Statistician. Screening  You may have the following tests or measurements: Height, weight, and BMI. Blood pressure. Lipid and cholesterol levels. These may be checked every 5 years, or more frequently if you are over 77 years old. Skin check. Lung cancer screening. You may have this screening every year starting at age 34 if you have a 30-pack-year history of smoking and currently smoke or have quit within the past 15 years. Fecal occult blood test (FOBT) of the stool. You may have this test every year starting at age 41. Flexible sigmoidoscopy or colonoscopy. You may have a sigmoidoscopy every 5 years or a colonoscopy every 10 years starting at age 15. Prostate cancer screening. Recommendations will vary depending  on your family history and other risks. Hepatitis  C blood test. Hepatitis B blood test. Sexually transmitted disease (STD) testing. Diabetes screening. This is done by checking your blood sugar (glucose) after you have not eaten for a while (fasting). You may have this done every 1-3 years. Abdominal aortic aneurysm (AAA) screening. You may need this if you are a current or former smoker. Osteoporosis. You may be screened starting at age 33 if you are at high risk. Talk with your health care provider about your test results, treatment options, and if necessary, the need for more tests. Vaccines  Your health care provider may recommend certain vaccines, such as: Influenza vaccine. This is recommended every year. Tetanus, diphtheria, and acellular pertussis (Tdap, Td) vaccine. You may need a Td booster every 10 years. Zoster vaccine. You may need this after age 30. Pneumococcal 13-valent conjugate (PCV13) vaccine. One dose is recommended after age 8. Pneumococcal polysaccharide (PPSV23) vaccine. One dose is recommended after age 66. Talk to your health care provider about which screenings and vaccines you need and how often you need them. This information is not intended to replace advice given to you by your health care provider. Make sure you discuss any questions you have with your health care provider. Document Released: 09/02/2015 Document Revised: 04/25/2016 Document Reviewed: 06/07/2015 Elsevier Interactive Patient Education  2017 [place] Prevention in the Home Falls can cause injuries. They can happen to people of all ages. There are many things you can do to make your home safe and to help prevent falls. What can I do on the outside of my home? Regularly fix the edges of walkways and driveways and fix any cracks. Remove anything that might make you trip as you walk through a door, such as a raised step or threshold. Trim any bushes or trees on the path to your home. Use  bright outdoor lighting. Clear any walking paths of anything that might make someone trip, such as rocks or tools. Regularly check to see if handrails are loose or broken. Make sure that both sides of any steps have handrails. Any raised decks and porches should have guardrails on the edges. Have any leaves, snow, or ice cleared regularly. Use sand or salt on walking paths during winter. Clean up any spills in your garage right away. This includes oil or grease spills. What can I do in the bathroom? Use night lights. Install grab bars by the toilet and in the tub and shower. Do not use towel bars as grab bars. Use non-skid mats or decals in the tub or shower. If you need to sit down in the shower, use a plastic, non-slip stool. Keep the floor dry. Clean up any water that spills on the floor as soon as it happens. Remove soap buildup in the tub or shower regularly. Attach bath mats securely with double-sided non-slip rug tape. Do not have throw rugs and other things on the floor that can make you trip. What can I do in the bedroom? Use night lights. Make sure that you have a light by your bed that is easy to reach. Do not use any sheets or blankets that are too big for your bed. They should not hang down onto the floor. Have a firm chair that has side arms. You can use this for support while you get dressed. Do not have throw rugs and other things on the floor that can make you trip. What can I do in the kitchen? Clean up any spills right away. Avoid walking on  wet floors. Keep items that you use a lot in easy-to-reach places. If you need to reach something above you, use a strong step stool that has a grab bar. Keep electrical cords out of the way. Do not use floor polish or wax that makes floors slippery. If you must use wax, use non-skid floor wax. Do not have throw rugs and other things on the floor that can make you trip. What can I do with my stairs? Do not leave any items on the  stairs. Make sure that there are handrails on both sides of the stairs and use them. Fix handrails that are broken or loose. Make sure that handrails are as long as the stairways. Check any carpeting to make sure that it is firmly attached to the stairs. Fix any carpet that is loose or worn. Avoid having throw rugs at the top or bottom of the stairs. If you do have throw rugs, attach them to the floor with carpet tape. Make sure that you have a light switch at the top of the stairs and the bottom of the stairs. If you do not have them, ask someone to add them for you. What else can I do to help prevent falls? Wear shoes that: Do not have high heels. Have rubber bottoms. Are comfortable and fit you well. Are closed at the toe. Do not wear sandals. If you use a stepladder: Make sure that it is fully opened. Do not climb a closed stepladder. Make sure that both sides of the stepladder are locked into place. Ask someone to hold it for you, if possible. Clearly mark and make sure that you can see: Any grab bars or handrails. First and last steps. Where the edge of each step is. Use tools that help you move around (mobility aids) if they are needed. These include: Canes. Walkers. Scooters. Crutches. Turn on the lights when you go into a dark area. Replace any light bulbs as soon as they burn out. Set up your furniture so you have a clear path. Avoid moving your furniture around. If any of your floors are uneven, fix them. If there are any pets around you, be aware of where they are. Review your medicines with your doctor. Some medicines can make you feel dizzy. This can increase your chance of falling. Ask your doctor what other things that you can do to help prevent falls. This information is not intended to replace advice given to you by your health care provider. Make sure you discuss any questions you have with your health care provider. Document Released: 06/02/2009 Document Revised:  01/12/2016 Document Reviewed: 09/10/2014 Elsevier Interactive Patient Education  2017 [employer].

## 2022-06-07 ENCOUNTER — Ambulatory Visit: Payer: Medicare Other | Admitting: Internal Medicine

## 2022-06-14 ENCOUNTER — Encounter: Payer: Self-pay | Admitting: Internal Medicine

## 2022-06-14 ENCOUNTER — Ambulatory Visit (INDEPENDENT_AMBULATORY_CARE_PROVIDER_SITE_OTHER): Payer: Medicare Other | Admitting: Internal Medicine

## 2022-06-14 VITALS — BP 142/76 | HR 66 | Temp 97.9°F | Ht 72.0 in | Wt 245.4 lb

## 2022-06-14 DIAGNOSIS — I25709 Atherosclerosis of coronary artery bypass graft(s), unspecified, with unspecified angina pectoris: Secondary | ICD-10-CM | POA: Diagnosis not present

## 2022-06-14 DIAGNOSIS — I10 Essential (primary) hypertension: Secondary | ICD-10-CM

## 2022-06-14 DIAGNOSIS — E1159 Type 2 diabetes mellitus with other circulatory complications: Secondary | ICD-10-CM | POA: Diagnosis not present

## 2022-06-14 DIAGNOSIS — E785 Hyperlipidemia, unspecified: Secondary | ICD-10-CM | POA: Diagnosis not present

## 2022-06-14 DIAGNOSIS — Z23 Encounter for immunization: Secondary | ICD-10-CM | POA: Diagnosis not present

## 2022-06-14 MED ORDER — HYDRALAZINE HCL 25 MG PO TABS: 25.0000 mg | ORAL_TABLET | Freq: Three times a day (TID) | ORAL | 3 refills | Status: DC

## 2022-06-14 MED ORDER — FOLIC ACID 1 MG PO TABS: 1.0000 mg | ORAL_TABLET | Freq: Every day | ORAL | 3 refills | Status: DC

## 2022-06-14 MED ORDER — FUROSEMIDE 40 MG PO TABS: ORAL_TABLET | ORAL | 3 refills | Status: DC

## 2022-06-14 MED ORDER — REPAGLINIDE 1 MG PO TABS: 1.0000 mg | ORAL_TABLET | Freq: Three times a day (TID) | ORAL | 3 refills | Status: DC

## 2022-06-14 MED ORDER — RELION CONFIRM GLUCOSE MONITOR W/DEVICE KIT: PACK | 0 refills | Status: AC

## 2022-06-14 MED ORDER — CARVEDILOL 12.5 MG PO TABS: 12.5000 mg | ORAL_TABLET | Freq: Two times a day (BID) | ORAL | 3 refills | Status: DC

## 2022-06-14 MED ORDER — NITROGLYCERIN 0.4 MG SL SUBL: 0.4000 mg | SUBLINGUAL_TABLET | SUBLINGUAL | 3 refills | Status: AC | PRN

## 2022-06-14 MED ORDER — SIMVASTATIN 40 MG PO TABS: 40.0000 mg | ORAL_TABLET | Freq: Every evening | ORAL | 3 refills | Status: DC

## 2022-06-14 MED ORDER — METFORMIN HCL 1000 MG PO TABS: 1000.0000 mg | ORAL_TABLET | Freq: Two times a day (BID) | ORAL | 3 refills | Status: DC

## 2022-06-14 MED ORDER — RELION LANCET DEVICES 30G MISC: 1 refills | Status: AC

## 2022-06-14 MED ORDER — KETOCONAZOLE 2 % EX CREA: 1.0000 | TOPICAL_CREAM | Freq: Two times a day (BID) | CUTANEOUS | 1 refills | Status: DC

## 2022-06-14 NOTE — Assessment & Plan Note (Signed)
Not taking Rybelsus yet - did not get it. On Farxiga

## 2022-06-14 NOTE — Progress Notes (Signed)
Subjective:  Patient ID: Larry [name], male    DOB: [birth year]  Age: 81 y.o. MRN: [id number]  CC: Follow-up (3 month f/u- Flu shot)   HPI Larry [name] presents for DM, HTN, CAD C/o urinary incontinence Not taking Rybelsus yet - did not get it. On Farxiga  Outpatient Medications Prior to Visit  Medication Sig Dispense Refill   aspirin 325 MG tablet Take 1 tablet (325 mg total) by mouth daily. 60 tablet 0   Cholecalciferol (VITAMIN D3) 1000 UNITS CAPS Take 1,000 Units by mouth daily.     clotrimazole (LOTRIMIN) 1 % cream Apply 1 application topically 2 (two) times daily.     Cyanocobalamin (VITAMIN B-12) 1000 MCG SUBL Place 1 tablet (1,000 mcg total) under the tongue daily. 100 tablet 3   dapagliflozin propanediol (FARXIGA) 10 MG TABS tablet Take 1 tablet (10 mg total) by mouth daily before breakfast. 30 tablet 11   fluconazole (DIFLUCAN) 100 MG tablet Take 1 tablet by mouth once daily 14 tablet 0   fluorouracil (EFUDEX) 5 % cream Apply topically 2 (two) times daily.     glucose blood (RELION GLUCOSE TEST STRIPS) test strip 1 each by Other route as needed for other. Use to check blood sugars twice a day     hydrocortisone cream 1 % Apply 1 application topically 2 (two) times daily.     Multiple Vitamins-Minerals (MACULAR VITAMIN BENEFIT) TABS Take 1 tablet by mouth 2 (two) times daily.     PREVIDENT 5000 DRY MOUTH 1.1 % GEL dental gel      Semaglutide (RYBELSUS) 3 MG TABS Take 3 mg by mouth daily. 30 tablet 3   thiamine 100 MG tablet Take 1 tablet (100 mg total) by mouth daily. 90 tablet 3   Blood Glucose Monitoring Suppl (RELION CONFIRM GLUCOSE MONITOR) w/Device KIT by Does not apply route. Use as directed     carvedilol (COREG) 12.5 MG tablet TAKE 1 TABLET BY MOUTH TWICE DAILY WITH MEALS 287 tablet 1   folic acid (FOLVITE) 1 MG tablet Take 1 tablet by mouth once daily 90 tablet 1   furosemide (LASIX) 40 MG tablet Take 1 tablet by mouth once daily 90 tablet 1   hydrALAZINE  (APRESOLINE) 25 MG tablet TAKE 1 TABLET BY MOUTH THREE TIMES DAILY 270 tablet 3   ketoconazole (NIZORAL) 2 % cream Apply 1 application. topically 2 (two) times daily. 45 g 1   metFORMIN (GLUCOPHAGE) 1000 MG tablet TAKE 1 TABLET BY MOUTH TWICE DAILY WITH A MEAL 180 tablet 3   nitroGLYCERIN (NITROSTAT) 0.4 MG SL tablet Place 1 tablet (0.4 mg total) under the tongue every 5 (five) minutes as needed for chest pain. 20 tablet 3   ReliOn Lancet Devices 30G MISC by Does not apply route. Use to check blood sugars twice a day     repaglinide (PRANDIN) 1 MG tablet TAKE 1 TABLET BY MOUTH THREE TIMES DAILY WITH MEALS 270 tablet 3   simvastatin (ZOCOR) 40 MG tablet TAKE 1 TABLET BY MOUTH ONCE DAILY IN THE EVENING 90 tablet 3   fluticasone (FLONASE) 50 MCG/ACT nasal spray Place 2 sprays into both nostrils daily. 1 g 0   Accu-Chek FastClix Lancets MISC USE 1  TO CHECK GLUCOSE ONCE DAILY (Patient not taking: Reported on 06/14/2022) 100 each 3   amoxicillin-clavulanate (AUGMENTIN) 875-125 MG tablet Take 1 tablet by mouth 2 (two) times daily. (Patient not taking: Reported on 05/18/2022) 20 tablet 0   glucose blood (ACCU-CHEK SMARTVIEW) test  strip Use to check blood sugars once a day (Patient not taking: Reported on 06/14/2022) 100 each 3   loratadine (CLARITIN) 10 MG tablet Take 1 tablet (10 mg total) by mouth daily. Use for 1 month then prn (Patient not taking: Reported on 06/14/2022) 100 tablet 3   promethazine (PHENERGAN) 25 MG tablet Take 1 tablet (25 mg total) by mouth every 8 (eight) hours as needed for up to 7 days for nausea or vomiting. 30 tablet 0   rizatriptan (MAXALT) 10 MG tablet Take 1 tablet (10 mg total) by mouth once as needed for up to 1 dose for migraine. May repeat in 2 hours if needed (Patient not taking: Reported on 06/14/2022) 12 tablet 12   No facility-administered medications prior to visit.    ROS: Review of Systems  Constitutional:  Negative for appetite change, fatigue and unexpected  weight change.  HENT:  Negative for congestion, nosebleeds, sneezing, sore throat and trouble swallowing.   Eyes:  Negative for itching and visual disturbance.  Respiratory:  Negative for cough.   Cardiovascular:  Negative for chest pain, palpitations and leg swelling.  Gastrointestinal:  Negative for abdominal distention, blood in stool, diarrhea and nausea.  Genitourinary:  Positive for frequency and urgency. Negative for hematuria.  Musculoskeletal:  Negative for back pain, gait problem, joint swelling and neck pain.  Skin:  Negative for rash.  Neurological:  Negative for dizziness, tremors, speech difficulty and weakness.  Psychiatric/Behavioral:  Negative for agitation, dysphoric mood and sleep disturbance. The patient is not nervous/anxious.     Objective:  BP (!) 142/76 (BP Location: Left Arm)   Pulse 66   Temp 97.9 F (36.6 C) (Oral)   Ht 6' (1.829 m)   Wt 245 lb 6.4 oz (111.3 kg)   SpO2 96%   BMI 33.28 kg/m   BP Readings from Last 3 Encounters:  06/14/22 (!) 142/76  03/22/22 (!) 145/58  03/06/22 122/78    Wt Readings from Last 3 Encounters:  06/14/22 245 lb 6.4 oz (111.3 kg)  05/18/22 230 lb (104.3 kg)  03/22/22 244 lb (110.7 kg)    Physical Exam Constitutional:      General: He is not in acute distress.    Appearance: He is well-developed. He is obese.     Comments: NAD  Eyes:     Conjunctiva/sclera: Conjunctivae normal.     Pupils: Pupils are equal, round, and reactive to light.  Neck:     Thyroid: No thyromegaly.     Vascular: No JVD.  Cardiovascular:     Rate and Rhythm: Normal rate and regular rhythm.     Heart sounds: Normal heart sounds. No murmur heard.    No friction rub. No gallop.  Pulmonary:     Effort: Pulmonary effort is normal. No respiratory distress.     Breath sounds: Normal breath sounds. No wheezing or rales.  Chest:     Chest wall: No tenderness.  Abdominal:     General: Bowel sounds are normal. There is no distension.      Palpations: Abdomen is soft. There is no mass.     Tenderness: There is no abdominal tenderness. There is no guarding or rebound.  Musculoskeletal:        General: No tenderness. Normal range of motion.     Cervical back: Normal range of motion.  Lymphadenopathy:     Cervical: No cervical adenopathy.  Skin:    General: Skin is warm and dry.     Findings: No  rash.  Neurological:     Mental Status: He is alert and oriented to person, place, and time.     Cranial Nerves: No cranial nerve deficit.     Motor: No abnormal muscle tone.     Coordination: Coordination normal.     Gait: Gait normal.     Deep Tendon Reflexes: Reflexes are normal and symmetric.  Psychiatric:        Behavior: Behavior normal.        Thought Content: Thought content normal.        Judgment: Judgment normal.     Lab Results  Component Value Date   WBC 9.8 03/22/2022   HGB 13.1 03/22/2022   HCT 41.0 03/22/2022   PLT 219 03/22/2022   GLUCOSE 170 (H) 05/09/2022   CHOL 125 06/16/2020   TRIG 99.0 06/16/2020   HDL 41.10 06/16/2020   LDLCALC 64 06/16/2020   ALT 13 10/18/2021   AST 16 10/18/2021   NA 139 05/09/2022   K 4.6 05/09/2022   CL 101 05/09/2022   CREATININE 1.37 (H) 05/09/2022   BUN 30 (H) 05/09/2022   CO2 24 05/09/2022   TSH 2.28 01/20/2021   PSA 0.00 Repeated and verified X2. (L) 12/26/2012   INR 1.0 10/18/2021   HGBA1C 6.9 (H) 05/09/2022    CT Head Wo Contrast  Result Date: 01/03/2022 CLINICAL DATA:  Head trauma, fell EXAM: CT HEAD WITHOUT CONTRAST TECHNIQUE: Contiguous axial images were obtained from the base of the skull through the vertex without intravenous contrast. RADIATION DOSE REDUCTION: This exam was performed according to the departmental dose-optimization program which includes automated exposure control, adjustment of the mA and/or kV according to patient size and/or use of iterative reconstruction technique. COMPARISON:  10/18/2021 FINDINGS: Brain: No acute infarct or hemorrhage.  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 Lateral ventricles and midline structures are otherwise unremarkable. No acute extra-axial fluid collections. No mass effect. Vascular: Stable atherosclerosis.  No hyperdense vessel. Skull: Normal. Negative for fracture or focal lesion. Sinuses/Orbits: No acute finding. Other: None. IMPRESSION: 1. No acute intracranial process. Electronically Signed   By: Randa Ngo M.D.   On: 01/03/2022 23:06   DG Hand Complete Right  Result Date: 01/03/2022 CLINICAL DATA:  Cough.  Fall. EXAM: RIGHT HAND - COMPLETE 3+ VIEW COMPARISON:  None Available. FINDINGS: There is no evidence of fracture or dislocation. There is no evidence of arthropathy or other focal bone abnormality. Soft tissues are unremarkable. IMPRESSION: Negative. Electronically Signed   By: Ronney Asters M.D.   On: 01/03/2022 23:01   DG Chest 2 View  Result Date: 01/03/2022 CLINICAL DATA:  Cough. EXAM: CHEST - 2 VIEW COMPARISON:  Chest x-ray 11/22/2018. FINDINGS: Sternotomy wires are present. The heart is enlarged, unchanged. Lungs are clear. There is no pleural effusion or pneumothorax. No acute fractures are seen. IMPRESSION: 1. No acute cardiopulmonary process. 2. Stable cardiomegaly. Electronically Signed   By: Ronney Asters M.D.   On: 01/03/2022 23:00    Assessment & Plan:   Problem List Items Addressed This Visit     DM2 (diabetes mellitus, type 2) ([place])    Not taking Rybelsus yet - did not get it. On Farxiga      Relevant Medications   metFORMIN (GLUCOPHAGE) 1000 MG tablet   repaglinide (PRANDIN) 1 MG tablet   simvastatin (ZOCOR) 40 MG tablet   Dyslipidemia    On Simvastatin      Relevant Medications   simvastatin (ZOCOR) 40  MG tablet   Uncontrolled hypertension     BP Readings from Last 3 Encounters:  06/14/22 (!) 142/76  03/22/22 (!) 145/58  03/06/22 122/78         Relevant Medications   carvedilol (COREG) 12.5 MG tablet    furosemide (LASIX) 40 MG tablet   hydrALAZINE (APRESOLINE) 25 MG tablet   nitroGLYCERIN (NITROSTAT) 0.4 MG SL tablet   simvastatin (ZOCOR) 40 MG tablet   Other Visit Diagnoses     Needs flu shot    -  Primary   Relevant Orders   Flu Vaccine QUAD High Dose(Fluad) (Completed)         Meds ordered this encounter  Medications   carvedilol (COREG) 12.5 MG tablet    Sig: Take 1 tablet (12.5 mg total) by mouth 2 (two) times daily with a meal.    Dispense:  180 tablet    Refill:  3   folic acid (FOLVITE) 1 MG tablet    Sig: Take 1 tablet (1 mg total) by mouth daily.    Dispense:  90 tablet    Refill:  3   furosemide (LASIX) 40 MG tablet    Sig: Take 1 tablet by mouth once daily    Dispense:  90 tablet    Refill:  3   hydrALAZINE (APRESOLINE) 25 MG tablet    Sig: Take 1 tablet (25 mg total) by mouth 3 (three) times daily.    Dispense:  270 tablet    Refill:  3   ketoconazole (NIZORAL) 2 % cream    Sig: Apply 1 Application topically 2 (two) times daily.    Dispense:  45 g    Refill:  1   metFORMIN (GLUCOPHAGE) 1000 MG tablet    Sig: Take 1 tablet (1,000 mg total) by mouth 2 (two) times daily with a meal.    Dispense:  180 tablet    Refill:  3   nitroGLYCERIN (NITROSTAT) 0.4 MG SL tablet    Sig: Place 1 tablet (0.4 mg total) under the tongue every 5 (five) minutes as needed for chest pain.    Dispense:  20 tablet    Refill:  3   repaglinide (PRANDIN) 1 MG tablet    Sig: Take 1 tablet (1 mg total) by mouth 3 (three) times daily with meals.    Dispense:  270 tablet    Refill:  3   simvastatin (ZOCOR) 40 MG tablet    Sig: Take 1 tablet (40 mg total) by mouth every evening.    Dispense:  90 tablet    Refill:  3   ReliOn Lancet Devices 30G MISC    Sig: Use to check blood sugars twice a day    Dispense:  180 each    Refill:  1    Dx E11.9   Blood Glucose Monitoring Suppl (RELION CONFIRM GLUCOSE MONITOR) w/Device KIT    Sig: Use as directed    Dispense:  1 kit    Refill:  0     Dx E11.9      Follow-up: No follow-ups on file.  Walker Kehr, MD

## 2022-06-14 NOTE — Assessment & Plan Note (Signed)
On Simvastatin 

## 2022-06-14 NOTE — Assessment & Plan Note (Signed)
  BP Readings from Last 3 Encounters:  06/14/22 (!) 142/76  03/22/22 (!) 145/58  03/06/22 122/78

## 2022-06-30 ENCOUNTER — Other Ambulatory Visit: Payer: Self-pay | Admitting: Internal Medicine

## 2022-07-19 ENCOUNTER — Telehealth: Payer: Self-pay | Admitting: Internal Medicine

## 2022-07-19 NOTE — Telephone Encounter (Signed)
Place form on MD desk to sign../l;mb

## 2022-07-19 NOTE — Telephone Encounter (Signed)
PT visits today with patient assistance forms to be filled out. Forms have been placed in Dr.Plotnikov's mailbox to be filled out!   (PT only brought back what needed to be filled out by Dr.Plotnikov.)  He also wanted to know if Dr.Plotnikov would still give the OK for getting a family friend established under his care. That PT being Sharilyn Sites.   CB for the PT: [phone]  [facility] for New Patient: [phone]

## 2022-07-20 NOTE — Telephone Encounter (Signed)
Okay. Thank you.

## 2022-07-23 MED ORDER — RYBELSUS 3 MG PO TABS: 3.0000 mg | ORAL_TABLET | Freq: Every day | ORAL | 3 refills | Status: DC

## 2022-07-23 NOTE — Telephone Encounter (Signed)
Called pt inform MD signed Pt Assistance forms. Pt want to pick form up inform pt will leave at then front desk. Also inform him MD agreed to see Sharilyn Sites. He can call and make appt.Marland KitchenJohny [name]

## 2022-07-25 NOTE — Telephone Encounter (Signed)
PT received forms and I am currently working on reaching out to [place] for their new patient appointment.

## 2022-07-30 NOTE — Progress Notes (Signed)
Cardiology Office Note:    Date:  07/31/2022   ID:  Larry [name], DOB 04/11/1941, MRN [id number]  PCP:  Cassandria Anger, MD  Cardiologist:  Sinclair Grooms, MD   Referring MD: Cassandria Anger, MD   Chief Complaint  Patient presents with   Congestive Heart Failure   Coronary Artery Disease    History of Present Illness:    Larry [name] is a 81 y.o. male with a hx of CAD, coronary bypass grafting 2018 (LIMA to LAD, SVG to Ramus Intermediate, SVG to OM, and SVG to RCA), obesity, type 2 diabetes, chronic diastolic heart failure (most recent hospital stay August 26, 2019) EF 45%-->60%, carotid artery disease status post left carotid endarterectomy January 2019, obstructive sleep apnea compliant with CPAP,,  and severe obesity.    Complains of persistent lower extremity swelling.  He developed yeast infection on Farxiga and it had to be discontinued after a short course of therapy.  He denies dyspnea.  He does not always take the Lasix especially on days when he is going to be out moving around.  He wears compression stockings but does not have them on today so that I could evaluate the lower extremity edema.  He wants [place] to do something to improve the lower extremity edema.  He is able to lie flat in bed.  He does not snore as best he can tell..  Past Medical History:  Diagnosis Date   Carotid stenosis, left    Cataracts, bilateral    removed   Coronary artery disease    Diabetes ([place])    Heart valve problem    Hyperlipemia    Prostate CA ([place])    Sleep apnea    Dr Ledora Bottcher ; uses CPAP   Stroke [facility])    Tubular adenoma of colon 02/2016   Type II or unspecified type diabetes mellitus without mention of complication, not stated as uncontrolled    Unspecified essential hypertension     Past Surgical History:  Procedure Laterality Date   APPENDECTOMY     CARDIAC CATHETERIZATION     CATARACT EXTRACTION, BILATERAL     COLONOSCOPY     CORONARY ARTERY BYPASS  GRAFT N/A 06/27/2017   Procedure: CORONARY ARTERY BYPASS GRAFTING (CABG), time four   using the left internal mammary artery, and right saphenous leg vein harvested endoscopically.;  Surgeon: Ivin Poot, MD;  Location: [place];  Service: Open Heart Surgery;  Laterality: N/A;   CYSTOSCOPY N/A 06/27/2017   Procedure: CYSTOSCOPY FLEXIBLE, Balloon dilatation placement of foley catheter;  Surgeon: Ivin Poot, MD;  Location: [place];  Service: Open Heart Surgery;  Laterality: N/A;   ENDARTERECTOMY Left 08/22/2017   Procedure: ENDARTERECTOMY CAROTID LEFT;  Surgeon: Serafina Mitchell, MD;  Location: [facility];  Service: Vascular;  Laterality: Left;   INGUINAL HERNIA REPAIR Left    INTRAVASCULAR ULTRASOUND/IVUS N/A 06/24/2017   Procedure: Intravascular Ultrasound/IVUS;  Surgeon: Belva Crome, MD;  Location: [place] CV LAB;  Service: Cardiovascular;  Laterality: N/A;  LEFT MAIN   LEFT HEART CATH AND CORONARY ANGIOGRAPHY N/A 06/24/2017   Procedure: LEFT HEART CATH AND CORONARY ANGIOGRAPHY;  Surgeon: Belva Crome, MD;  Location: [place] CV LAB;  Service: Cardiovascular;  Laterality: N/A;   PATCH ANGIOPLASTY Left 08/22/2017   Procedure: PATCH ANGIOPLASTY USING Rueben Bash BIOLOGIC PATCH;  Surgeon: Serafina Mitchell, MD;  Location: [facility];  Service: Vascular;  Laterality: Left;   PROSTATECTOMY  TEE WITHOUT CARDIOVERSION N/A 06/27/2017   Procedure: TRANSESOPHAGEAL ECHOCARDIOGRAM (TEE);  Surgeon: Prescott Gum, Collier Salina, MD;  Location: [place];  Service: Open Heart Surgery;  Laterality: N/A;   TONSILLECTOMY     ULTRASOUND GUIDANCE FOR VASCULAR ACCESS  06/24/2017   Procedure: Ultrasound Guidance For Vascular Access;  Surgeon: Belva Crome, MD;  Location: [place] CV LAB;  Service: Cardiovascular;;    Current Medications: Current Meds  Medication Sig   aspirin 325 MG tablet Take 1 tablet (325 mg total) by mouth daily.   Blood Glucose Monitoring Suppl (RELION CONFIRM GLUCOSE MONITOR) w/Device KIT Use  as directed   carvedilol (COREG) 12.5 MG tablet Take 1 tablet (12.5 mg total) by mouth 2 (two) times daily with a meal.   Cholecalciferol (VITAMIN D3) 1000 UNITS CAPS Take 1,000 Units by mouth daily.   clotrimazole (LOTRIMIN) 1 % cream Apply 1 application topically 2 (two) times daily.   Cyanocobalamin (VITAMIN B-12) 1000 MCG SUBL Place 1 tablet (1,000 mcg total) under the tongue daily.   fluconazole (DIFLUCAN) 100 MG tablet Take 1 tablet by mouth once daily   fluorouracil (EFUDEX) 5 % cream Apply topically 2 (two) times daily.   fluticasone (FLONASE) 50 MCG/ACT nasal spray Place 2 sprays into both nostrils daily.   folic acid (FOLVITE) 1 MG tablet Take 1 tablet (1 mg total) by mouth daily.   furosemide (LASIX) 40 MG tablet Take 1 tablet by mouth once daily   glucose blood (RELION GLUCOSE TEST STRIPS) test strip 1 each by Other route as needed for other. Use to check blood sugars twice a day   hydrALAZINE (APRESOLINE) 25 MG tablet Take 1 tablet (25 mg total) by mouth 3 (three) times daily.   hydrocortisone cream 1 % Apply 1 application topically 2 (two) times daily.   ketoconazole (NIZORAL) 2 % cream Apply 1 Application topically 2 (two) times daily.   metFORMIN (GLUCOPHAGE) 1000 MG tablet Take 1 tablet (1,000 mg total) by mouth 2 (two) times daily with a meal.   Multiple Vitamins-Minerals (MACULAR VITAMIN BENEFIT) TABS Take 1 tablet by mouth 2 (two) times daily.   nitroGLYCERIN (NITROSTAT) 0.4 MG SL tablet Place 1 tablet (0.4 mg total) under the tongue every 5 (five) minutes as needed for chest pain.   PREVIDENT 5000 DRY MOUTH 1.1 % GEL dental gel    ReliOn Lancet Devices 30G MISC Use to check blood sugars twice a day   repaglinide (PRANDIN) 1 MG tablet Take 1 tablet (1 mg total) by mouth 3 (three) times daily with meals.   Semaglutide (RYBELSUS) 3 MG TABS Take 3 mg by mouth daily.   simvastatin (ZOCOR) 40 MG tablet Take 1 tablet (40 mg total) by mouth every evening. Follow-up appt w/labs is  due must see provider for future refills   spironolactone (ALDACTONE) 25 MG tablet Take 0.5 tablets (12.18m total) by mouth daily   thiamine 100 MG tablet Take 1 tablet (100 mg total) by mouth daily.   [DISCONTINUED] dapagliflozin propanediol (FARXIGA) 10 MG TABS tablet Take 1 tablet (10 mg total) by mouth daily before breakfast.     Allergies:   Amlodipine, Verapamil, Clindamycin hcl, Farxiga [dapagliflozin], and Contrast media [iodinated contrast media]   Social History   Socioeconomic History   Marital status: Married    Spouse name: Not on file   Number of children: 4   Years of education: Not on file   Highest education level: Bachelor's degree (e.g., BA, AB, BS)  Occupational History  Occupation: Hotel manager    Comment: retired  Tobacco Use   Smoking status: Never   Smokeless tobacco: Never  Vaping Use   Vaping Use: Never used  Substance and Sexual Activity   Alcohol use: No    Alcohol/week: 0.0 standard drinks of alcohol   Drug use: No   Sexual activity: Not on file  Other Topics Concern   Not on file  Social History Narrative   Regular Exercise- no, golf   Social Determinants of Health   Financial Resource Strain: Low Risk  (05/18/2022)   Overall Financial Resource Strain (CARDIA)    Difficulty of Paying Living Expenses: Not hard at all  Food Insecurity: No Food Insecurity (05/18/2022)   Hunger Vital Sign    Worried About Running Out of Food in the Last Year: Never true    Ran Out of Food in the Last Year: Never true  Transportation Needs: No Transportation Needs (05/18/2022)   PRAPARE - Hydrologist (Medical): No    Lack of Transportation (Non-Medical): No  Physical Activity: Insufficiently Active (05/18/2022)   Exercise Vital Sign    Days of Exercise per Week: 3 days    Minutes of Exercise per Session: 30 min  Stress: No Stress Concern Present (05/18/2022)   [place]     Feeling of Stress : Not at all  Social Connections: [place] (05/18/2022)   Social Connection and Isolation Panel [NHANES]    Frequency of Communication with Friends and Family: More than three times a week    Frequency of Social Gatherings with Friends and Family: More than three times a week    Attends Religious Services: More than 4 times per year    Active Member of [employer] or Organizations: Yes    Attends Music therapist: More than 4 times per year    Marital Status: Married     Family History: The patient's family history includes Diabetes in an other family member; Heart attack in his brother; Heart disease in his father and mother; Hypertension in an other family member. There is no history of Colon cancer, Esophageal cancer, Rectal cancer, or Stomach cancer.  ROS:   Please see the history of present illness.    Ulcers.  All other systems reviewed and are negative.  EKGs/Labs/Other Studies Reviewed:    The following studies were reviewed today:  ECHOCARDIOGRAM 03/26/2022: IMPRESSIONS   1. Left ventricular ejection fraction, by estimation, is 55 to 60%. The  left ventricle has normal function. The left ventricle has no regional  wall motion abnormalities. There is mild asymmetric left ventricular  hypertrophy of the basal-septal segment.  Left ventricular diastolic parameters are consistent with Grade II  diastolic dysfunction (pseudonormalization).   2. Right ventricular systolic function is normal. The right ventricular  size is mildly enlarged. There is mildly elevated pulmonary artery  systolic pressure. The estimated right ventricular systolic pressure is  79.8 mmHg.   3. Left atrial size was mild to moderately dilated.   4. The mitral valve is grossly normal. Mild mitral valve regurgitation.  No evidence of mitral stenosis.   5. The aortic valve is tricuspid. Aortic valve regurgitation is not  visualized. Aortic valve sclerosis is present,  with no evidence of aortic  valve stenosis.   6. The inferior vena cava is normal in size with greater than 50%  respiratory variability, suggesting right atrial pressure of 3 mmHg.   Comparison(s): No significant  change from prior study.   EKG:  EKG today.  Recent Labs: 10/18/2021: ALT 13 03/22/2022: Hemoglobin 13.1; Platelets 219 05/09/2022: BUN 30; Creatinine, Ser 1.37; NT-Pro BNP 485; Potassium 4.6; Sodium 139  Recent Lipid Panel    Component Value Date/Time   CHOL 125 06/16/2020 1019   CHOL 137 09/19/2018 0743   TRIG 99.0 06/16/2020 1019   HDL 41.10 06/16/2020 1019   HDL 39 (L) 09/19/2018 0743   CHOLHDL 3 06/16/2020 1019   VLDL 19.8 06/16/2020 1019   LDLCALC 64 06/16/2020 1019   LDLCALC 67 09/19/2018 0743    Physical Exam:    VS:  BP 122/64   Pulse 68   Ht 6' (1.829 m)   Wt 252 lb (114.3 kg)   SpO2 97%   BMI 34.18 kg/m     Wt Readings from Last 3 Encounters:  07/31/22 252 lb (114.3 kg)  06/14/22 245 lb 6.4 oz (111.3 kg)  05/18/22 230 lb (104.3 kg)     GEN: Morbid. No acute distress HEENT: Normal NECK: Elevated JV noted while sitting. LYMPHATICS: No lymphadenopathy CARDIAC: No murmur. RRR S4 gallop, severe ankle and shin edema bilaterally, 3+. VASCULAR:  Normal Pulses. No bruits. RESPIRATORY:  Clear to auscultation without rales, wheezing or rhonchi  ABDOMEN: Soft, non-tender, non-distended, No pulsatile mass, MUSCULOSKELETAL: No deformity  SKIN: Warm and dry NEUROLOGIC:  Alert and oriented x 3 PSYCHIATRIC:  Normal affect   ASSESSMENT:    1. Coronary artery disease involving coronary bypass graft of native heart with angina pectoris ([place])   2. Chronic combined systolic and diastolic heart failure ([place])   3. Essential hypertension   4. Dyslipidemia   5. Type 2 diabetes mellitus with other circulatory complication, without long-term current use of insulin ([place])   6. Bilateral carotid artery stenosis    PLAN:    In order of problems listed above:  No  angina and no nitroglycerin use. Preserved systolic function with recovery but persistent diastolic dysfunction.  Unable to tolerate SGLT2 therapy due to yeast infection and balanitis.  Start spironolactone 12.5 mg daily.  Basic metabolic panel 10 to 14 days. Blood pressure was repeated and is 138/90 mmHg.  I believe his blood pressure would tolerate the addition of spironolactone. Continue statin therapy. Per Dr. Alain Marion Secondary prevention reviewed.   Overall education and awareness concerning primary/secondary risk prevention was discussed in detail: LDL less than 70, hemoglobin A1c less than 7, blood pressure target less than 130/80 mmHg, >150 minutes of moderate aerobic activity per week, avoidance of smoking, weight control (via diet and exercise), and continued surveillance/management of/for obstructive sleep apnea.   Guideline directed therapy for left ventricular diastolic dysfunction: mineralocorticoid receptor antagonist (MRA) therapy -spironolactone or eplerenone.  SGLT-2 agents -  Dapagliflozin Wilder Glade) or Empagliflozin ([place]).These therapies have been shown to improve clinical outcomes including reduction of rehospitalization, survival, and acute heart failure.  Failed SGLT2 therapy because of yeast infection, genitourinary.   70-monthfollow-up [place]ras[place]  Medication Adjustments/Labs and Tests Ordered: Current medicines are reviewed at length with the patient today.  Concerns regarding medicines are outlined above.  Orders Placed This Encounter  Procedures   Basic metabolic panel   Meds ordered this encounter  Medications   spironolactone (ALDACTONE) 25 MG tablet    Sig: Take 0.5 tablets (12.545mtotal) by mouth daily    Dispense:  50 tablet    Refill:  3    Patient Instructions  Medication Instructions:  Your physician has recommended you make the following change  in your medication:   1) START spironolactone 12.25m daily  FWilder Gladehas  been removed from your medication list.  *If you need a refill on your cardiac medications before your next appointment, please call your pharmacy*  Lab Work: In 10-14 days: BMET If you have labs (blood work) drawn today and your tests are completely normal, you will receive your results only by: [place](if you have MyChart) OR A paper copy in the mail If you have any lab test that is abnormal or we need to change your treatment, we will call you to review the results.  Follow-Up: [facility] and your [facility] needs are our priority.  As part of our continuing mission to provide you with exceptional heart care, we have created designated Provider Care Teams.  These Care Teams include your primary Cardiologist (physician) and Advanced Practice Providers (APPs -  Physician Assistants and Nurse Practitioners) who all work together to provide you with the care you need, when you need it.  Your next appointment:   3-4 month(s)  The format for your next appointment:   In Person  Provider:   MCandee Furbish MD or EAmbrose Pancoast NP or SRichardson Dopp PA-C or MChristen Bame NP  Other Instructions Your physician recommends that you wear your compression stockings daily during waking hours.  Important Information About Sugar         Signed, HSinclair Grooms MD  07/31/2022 9:05 AM    [place]

## 2022-07-31 ENCOUNTER — Encounter: Payer: Self-pay | Admitting: Interventional Cardiology

## 2022-07-31 ENCOUNTER — Ambulatory Visit: Payer: Medicare Other | Attending: Interventional Cardiology | Admitting: Interventional Cardiology

## 2022-07-31 VITALS — BP 122/64 | HR 68 | Ht 72.0 in | Wt 252.0 lb

## 2022-07-31 DIAGNOSIS — E785 Hyperlipidemia, unspecified: Secondary | ICD-10-CM | POA: Insufficient documentation

## 2022-07-31 DIAGNOSIS — I5042 Chronic combined systolic (congestive) and diastolic (congestive) heart failure: Secondary | ICD-10-CM | POA: Insufficient documentation

## 2022-07-31 DIAGNOSIS — I6523 Occlusion and stenosis of bilateral carotid arteries: Secondary | ICD-10-CM | POA: Diagnosis not present

## 2022-07-31 DIAGNOSIS — E1159 Type 2 diabetes mellitus with other circulatory complications: Secondary | ICD-10-CM | POA: Insufficient documentation

## 2022-07-31 DIAGNOSIS — I10 Essential (primary) hypertension: Secondary | ICD-10-CM | POA: Insufficient documentation

## 2022-07-31 DIAGNOSIS — I25709 Atherosclerosis of coronary artery bypass graft(s), unspecified, with unspecified angina pectoris: Secondary | ICD-10-CM | POA: Diagnosis not present

## 2022-07-31 MED ORDER — SPIRONOLACTONE 25 MG PO TABS: ORAL_TABLET | ORAL | 3 refills | Status: DC

## 2022-07-31 NOTE — Patient Instructions (Addendum)
Medication Instructions:  Your physician has recommended you make the following change in your medication:   1) START spironolactone 12.'5mg'$  daily  Wilder Glade has been removed from your medication list.  *If you need a refill on your cardiac medications before your next appointment, please call your pharmacy*  Lab Work: In 10-14 days: BMET If you have labs (blood work) drawn today and your tests are completely normal, you will receive your results only by: [place] (if you have MyChart) OR A paper copy in the mail If you have any lab test that is abnormal or we need to change your treatment, we will call you to review the results.  Follow-Up: At [facility], you and your health needs are our priority.  As part of our continuing mission to provide you with exceptional heart care, we have created designated Provider Care Teams.  These Care Teams include your primary Cardiologist (physician) and Advanced Practice Providers (APPs -  Physician Assistants and Nurse Practitioners) who all work together to provide you with the care you need, when you need it.  Your next appointment:   3-4 month(s)  The format for your next appointment:   In Person  Provider:   Candee Furbish, MD or Ambrose Pancoast, NP or Richardson Dopp, PA-C or Christen Bame, NP  Other Instructions Your physician recommends that you wear your compression stockings daily during waking hours.  Important Information About Sugar

## 2022-08-08 DIAGNOSIS — M48062 Spinal stenosis, lumbar region with neurogenic claudication: Secondary | ICD-10-CM | POA: Diagnosis not present

## 2022-08-10 ENCOUNTER — Ambulatory Visit: Payer: Medicare Other | Attending: Interventional Cardiology

## 2022-08-10 DIAGNOSIS — I10 Essential (primary) hypertension: Secondary | ICD-10-CM | POA: Diagnosis not present

## 2022-08-10 LAB — BASIC METABOLIC PANEL
BUN/Creatinine Ratio: 21 (ref 10–24)
BUN: 28 mg/dL — ABNORMAL HIGH (ref 8–27)
CO2: 25 mmol/L (ref 20–29)
Calcium: 9.7 mg/dL (ref 8.6–10.2)
Chloride: 98 mmol/L (ref 96–106)
Creatinine, Ser: 1.35 mg/dL — ABNORMAL HIGH (ref 0.76–1.27)
Glucose: 279 mg/dL — ABNORMAL HIGH (ref 70–99)
Potassium: 4.8 mmol/L (ref 3.5–5.2)
Sodium: 139 mmol/L (ref 134–144)
eGFR: 53 mL/min/{1.73_m2} — ABNORMAL LOW (ref >59)

## 2022-09-05 NOTE — Telephone Encounter (Signed)
PT visits today with patient assistance forms to be faxed out. PT stated he was not having any luck getting it out to [employer]. He stated that he would like either the original or a copy left for him to pick up during his next appointment.  CB if needed: [phone]

## 2022-09-05 NOTE — Telephone Encounter (Signed)
Inform pt faxed pt assistance papers to [phone] and received confirmation that it went through. Will hold on to papers until next week pt will pick up at his visit.Marland KitchenJohny [name]

## 2022-09-12 ENCOUNTER — Telehealth: Payer: Self-pay

## 2022-09-12 NOTE — Telephone Encounter (Signed)
Patient assistance arrived today for Rybelsus 3 mg and '7mg'$ .  Spoke with him and patient will pick up script tomorrow since he has appointment with Dr. Camila Li.

## 2022-09-13 ENCOUNTER — Encounter: Payer: Self-pay | Admitting: Internal Medicine

## 2022-09-13 ENCOUNTER — Ambulatory Visit (INDEPENDENT_AMBULATORY_CARE_PROVIDER_SITE_OTHER): Payer: Medicare Other | Admitting: Internal Medicine

## 2022-09-13 VITALS — BP 122/68 | HR 65 | Temp 97.9°F | Ht 72.0 in | Wt 244.0 lb

## 2022-09-13 DIAGNOSIS — E662 Morbid (severe) obesity with alveolar hypoventilation: Secondary | ICD-10-CM

## 2022-09-13 DIAGNOSIS — I25709 Atherosclerosis of coronary artery bypass graft(s), unspecified, with unspecified angina pectoris: Secondary | ICD-10-CM

## 2022-09-13 DIAGNOSIS — E1159 Type 2 diabetes mellitus with other circulatory complications: Secondary | ICD-10-CM

## 2022-09-13 DIAGNOSIS — R32 Unspecified urinary incontinence: Secondary | ICD-10-CM

## 2022-09-13 MED ORDER — ASPIRIN 81 MG PO TBEC: 81.0000 mg | DELAYED_RELEASE_TABLET | Freq: Every day | ORAL | 3 refills | Status: AC

## 2022-09-13 MED ORDER — RELION BLOOD GLUCOSE TEST VI STRP: 1.0000 | ORAL_STRIP | 11 refills | Status: DC | PRN

## 2022-09-13 NOTE — Assessment & Plan Note (Signed)
Starting Rubelsus today. Pt lost wt on diet

## 2022-09-13 NOTE — Assessment & Plan Note (Signed)
No CP On baby ASA

## 2022-09-13 NOTE — Progress Notes (Signed)
Subjective:  Patient ID: Larry [name], male    DOB: [birth year]  Age: 82 y.o. MRN: [id number]  CC: Follow-up   HPI Larry [name] presents for DM, HTN, CAD f/u. Starting Rubelsus today. Pt lost wt on diet  Outpatient Medications Prior to Visit  Medication Sig Dispense Refill   Blood Glucose Monitoring Suppl (RELION CONFIRM GLUCOSE MONITOR) w/Device KIT Use as directed 1 kit 0   carvedilol (COREG) 12.5 MG tablet Take 1 tablet (12.5 mg total) by mouth 2 (two) times daily with a meal. 180 tablet 3   Cholecalciferol (VITAMIN D3) 1000 UNITS CAPS Take 1,000 Units by mouth daily.     clotrimazole (LOTRIMIN) 1 % cream Apply 1 application topically 2 (two) times daily.     Cyanocobalamin (VITAMIN B-12) 1000 MCG SUBL Place 1 tablet (1,000 mcg total) under the tongue daily. 100 tablet 3   fluconazole (DIFLUCAN) 100 MG tablet Take 1 tablet by mouth once daily 14 tablet 0   fluorouracil (EFUDEX) 5 % cream Apply topically 2 (two) times daily.     folic acid (FOLVITE) 1 MG tablet Take 1 tablet (1 mg total) by mouth daily. 90 tablet 3   furosemide (LASIX) 40 MG tablet Take 1 tablet by mouth once daily 90 tablet 3   glucose blood (RELION GLUCOSE TEST STRIPS) test strip 1 each by Other route as needed for other. Use to check blood sugars twice a day     hydrALAZINE (APRESOLINE) 25 MG tablet Take 1 tablet (25 mg total) by mouth 3 (three) times daily. 270 tablet 3   hydrocortisone cream 1 % Apply 1 application topically 2 (two) times daily.     ketoconazole (NIZORAL) 2 % cream Apply 1 Application topically 2 (two) times daily. 45 g 1   metFORMIN (GLUCOPHAGE) 1000 MG tablet Take 1 tablet (1,000 mg total) by mouth 2 (two) times daily with a meal. 180 tablet 3   Multiple Vitamins-Minerals (MACULAR VITAMIN BENEFIT) TABS Take 1 tablet by mouth 2 (two) times daily.     nitroGLYCERIN (NITROSTAT) 0.4 MG SL tablet Place 1 tablet (0.4 mg total) under the tongue every 5 (five) minutes as needed for chest pain. 20  tablet 3   PREVIDENT 5000 DRY MOUTH 1.1 % GEL dental gel      ReliOn Lancet Devices 30G MISC Use to check blood sugars twice a day 180 each 1   repaglinide (PRANDIN) 1 MG tablet Take 1 tablet (1 mg total) by mouth 3 (three) times daily with meals. 270 tablet 3   Semaglutide (RYBELSUS) 3 MG TABS Take 3 mg by mouth daily. 90 tablet 3   simvastatin (ZOCOR) 40 MG tablet Take 1 tablet (40 mg total) by mouth every evening. Follow-up appt w/labs is due must see provider for future refills 90 tablet 0   spironolactone (ALDACTONE) 25 MG tablet Take 0.5 tablets (12.'5mg'$  total) by mouth daily 50 tablet 3   thiamine 100 MG tablet Take 1 tablet (100 mg total) by mouth daily. 90 tablet 3   aspirin 325 MG tablet Take 1 tablet (325 mg total) by mouth daily. 60 tablet 0   fluticasone (FLONASE) 50 MCG/ACT nasal spray Place 2 sprays into both nostrils daily. 1 g 0   No facility-administered medications prior to visit.    ROS: Review of Systems  Constitutional:  Negative for appetite change, fatigue and unexpected weight change.  HENT:  Negative for congestion, nosebleeds, sneezing, sore throat and trouble swallowing.   Eyes:  Negative for  itching and visual disturbance.  Respiratory:  Negative for cough.   Cardiovascular:  Negative for chest pain, palpitations and leg swelling.  Gastrointestinal:  Negative for abdominal distention, blood in stool, diarrhea and nausea.  Genitourinary:  Negative for frequency and hematuria.  Musculoskeletal:  Negative for back pain, gait problem, joint swelling and neck pain.  Skin:  Negative for rash.  Neurological:  Negative for dizziness, tremors, speech difficulty and weakness.  Psychiatric/Behavioral:  Negative for agitation, dysphoric mood and sleep disturbance. The patient is not nervous/anxious.     Objective:  BP 122/68 (BP Location: Left Arm, Patient Position: Sitting, Cuff Size: Normal)   Pulse 65   Temp 97.9 F (36.6 C) (Oral)   Ht 6' (1.829 m)   Wt 244 lb  (110.7 kg)   SpO2 98%   BMI 33.09 kg/m   BP Readings from Last 3 Encounters:  09/13/22 122/68  07/31/22 122/64  06/14/22 (!) 142/76    Wt Readings from Last 3 Encounters:  09/13/22 244 lb (110.7 kg)  07/31/22 252 lb (114.3 kg)  06/14/22 245 lb 6.4 oz (111.3 kg)    Physical Exam Constitutional:      General: He is not in acute distress.    Appearance: He is well-developed. He is obese.     Comments: NAD  Eyes:     Conjunctiva/sclera: Conjunctivae normal.     Pupils: Pupils are equal, round, and reactive to light.  Neck:     Thyroid: No thyromegaly.     Vascular: No JVD.  Cardiovascular:     Rate and Rhythm: Normal rate and regular rhythm.     Heart sounds: Normal heart sounds. No murmur heard.    No friction rub. No gallop.  Pulmonary:     Effort: Pulmonary effort is normal. No respiratory distress.     Breath sounds: Normal breath sounds. No wheezing or rales.  Chest:     Chest wall: No tenderness.  Abdominal:     General: Bowel sounds are normal. There is no distension.     Palpations: Abdomen is soft. There is no mass.     Tenderness: There is no abdominal tenderness. There is no guarding or rebound.  Musculoskeletal:        General: No tenderness. Normal range of motion.     Cervical back: Normal range of motion.  Lymphadenopathy:     Cervical: No cervical adenopathy.  Skin:    General: Skin is warm and dry.     Findings: No rash.  Neurological:     Mental Status: He is alert and oriented to person, place, and time.     Cranial Nerves: No cranial nerve deficit.     Motor: No abnormal muscle tone.     Coordination: Coordination normal.     Gait: Gait normal.     Deep Tendon Reflexes: Reflexes are normal and symmetric.  Psychiatric:        Behavior: Behavior normal.        Thought Content: Thought content normal.        Judgment: Judgment normal.     Lab Results  Component Value Date   WBC 9.8 03/22/2022   HGB 13.1 03/22/2022   HCT 41.0 03/22/2022    PLT 219 03/22/2022   GLUCOSE 279 (H) 08/10/2022   CHOL 125 06/16/2020   TRIG 99.0 06/16/2020   HDL 41.10 06/16/2020   LDLCALC 64 06/16/2020   ALT 13 10/18/2021   AST 16 10/18/2021   NA 139 08/10/2022  K 4.8 08/10/2022   CL 98 08/10/2022   CREATININE 1.35 (H) 08/10/2022   BUN 28 (H) 08/10/2022   CO2 25 08/10/2022   TSH 2.28 01/20/2021   PSA 0.00 Repeated and verified X2. (L) 12/26/2012   INR 1.0 10/18/2021   HGBA1C 6.9 (H) 05/09/2022    CT Head Wo Contrast  Result Date: 01/03/2022 CLINICAL DATA:  Head trauma, fell EXAM: CT HEAD WITHOUT CONTRAST TECHNIQUE: Contiguous axial images were obtained from the base of the skull through the vertex without intravenous contrast. RADIATION DOSE REDUCTION: This exam was performed according to the departmental dose-optimization program which includes automated exposure control, adjustment of the mA and/or kV according to patient size and/or use of iterative reconstruction technique. COMPARISON:  10/18/2021 FINDINGS: Brain: No acute infarct or hemorrhage. 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 Lateral ventricles and midline structures are otherwise unremarkable. No acute extra-axial fluid collections. No mass effect. Vascular: Stable atherosclerosis.  No hyperdense vessel. Skull: Normal. Negative for fracture or focal lesion. Sinuses/Orbits: No acute finding. Other: None. IMPRESSION: 1. No acute intracranial process. Electronically Signed   By: Randa Ngo M.D.   On: 01/03/2022 23:06   DG Hand Complete Right  Result Date: 01/03/2022 CLINICAL DATA:  Cough.  Fall. EXAM: RIGHT HAND - COMPLETE 3+ VIEW COMPARISON:  None Available. FINDINGS: There is no evidence of fracture or dislocation. There is no evidence of arthropathy or other focal bone abnormality. Soft tissues are unremarkable. IMPRESSION: Negative. Electronically Signed   By: Ronney Asters M.D.   On: 01/03/2022 23:01   DG Chest 2 View  Result  Date: 01/03/2022 CLINICAL DATA:  Cough. EXAM: CHEST - 2 VIEW COMPARISON:  Chest x-ray 11/22/2018. FINDINGS: Sternotomy wires are present. The heart is enlarged, unchanged. Lungs are clear. There is no pleural effusion or pneumothorax. No acute fractures are seen. IMPRESSION: 1. No acute cardiopulmonary process. 2. Stable cardiomegaly. Electronically Signed   By: Ronney Asters M.D.   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 Pt lost wt on diet        Endocrine   DM2 (diabetes mellitus, type 2) ([place]) - Primary    Starting Rubelsus today. Pt lost wt on diet      Relevant Medications   aspirin EC 81 MG tablet   Other Relevant Orders   Comprehensive metabolic panel   Hemoglobin A1c     Other   Urinary incontinence   Relevant Orders   Ambulatory referral to Urology      Meds ordered this encounter  Medications   aspirin EC 81 MG tablet    Sig: Take 1 tablet (81 mg total) by mouth daily.    Dispense:  100 tablet    Refill:  3      Follow-up: Return in about 3 months (around 12/13/2022) for a follow-up visit.  Walker Kehr, MD

## 2022-09-24 ENCOUNTER — Telehealth: Payer: Self-pay | Admitting: *Deleted

## 2022-09-24 NOTE — Telephone Encounter (Signed)
MD received a fax from [facility] for Lumbar orthosis. MD wanted to make sure if pt was asking. Called pt he states "NO" he does not need. Inform will shred fax.Marland KitchenJohny Chess

## 2022-11-01 ENCOUNTER — Telehealth: Payer: Self-pay

## 2022-11-01 DIAGNOSIS — H40013 Open angle with borderline findings, low risk, bilateral: Secondary | ICD-10-CM | POA: Diagnosis not present

## 2022-11-01 DIAGNOSIS — H0102B Squamous blepharitis left eye, upper and lower eyelids: Secondary | ICD-10-CM | POA: Diagnosis not present

## 2022-11-01 DIAGNOSIS — H0102A Squamous blepharitis right eye, upper and lower eyelids: Secondary | ICD-10-CM | POA: Diagnosis not present

## 2022-11-01 LAB — HM DIABETES EYE EXAM

## 2022-11-07 DIAGNOSIS — L821 Other seborrheic keratosis: Secondary | ICD-10-CM | POA: Diagnosis not present

## 2022-11-07 DIAGNOSIS — D692 Other nonthrombocytopenic purpura: Secondary | ICD-10-CM | POA: Diagnosis not present

## 2022-11-07 DIAGNOSIS — L570 Actinic keratosis: Secondary | ICD-10-CM | POA: Diagnosis not present

## 2022-11-07 DIAGNOSIS — L578 Other skin changes due to chronic exposure to nonionizing radiation: Secondary | ICD-10-CM | POA: Diagnosis not present

## 2022-11-07 DIAGNOSIS — Z85828 Personal history of other malignant neoplasm of skin: Secondary | ICD-10-CM | POA: Diagnosis not present

## 2022-11-07 DIAGNOSIS — Z859 Personal history of malignant neoplasm, unspecified: Secondary | ICD-10-CM | POA: Diagnosis not present

## 2022-11-08 ENCOUNTER — Encounter: Payer: Self-pay | Admitting: Internal Medicine

## 2022-11-08 NOTE — Telephone Encounter (Signed)
Called # that was given spoke w/ [place] he states will have to call Pt Assistance @ [phone]. Option 4, Option 2 then Option 4. Spoke w/ rep Chrys Racer gave pt information. She states the reordering process will habve to come from fax. Will fax MD order form to place order.She is faxing form to [phone]...[name]

## 2022-11-09 MED ORDER — RYBELSUS 3 MG PO TABS: 3.0000 mg | ORAL_TABLET | Freq: Every day | ORAL | 3 refills | Status: DC

## 2022-11-09 NOTE — Addendum Note (Signed)
Addended by: Earnstine Regal on: 11/09/2022 11:59 AM   Modules accepted: Orders

## 2022-11-09 NOTE — Telephone Encounter (Signed)
Rec'd refill form completed and fax back to [employer] @ [phone] along w/ prescription...Larry [name]

## 2022-11-09 NOTE — Telephone Encounter (Signed)
Pt called back wanted me to give this number  [phone] Rybelsus for [facility]

## 2022-11-11 ENCOUNTER — Other Ambulatory Visit: Payer: Self-pay | Admitting: Internal Medicine

## 2022-11-19 MED ORDER — RYBELSUS 7 MG PO TABS: 7.0000 mg | ORAL_TABLET | Freq: Every day | ORAL | 9 refills | Status: DC

## 2022-11-19 NOTE — Telephone Encounter (Signed)
Pt return call he states [facility] would like to verify the Rebelsus he is now taking. Pt states he is doing 7 mg now. [address] spoke w/ Erich Montane pt ID # [id number]. She states we will have to fill out refill form and send back. Updated refill form and fax to [phone].Marland KitchenJohny Chess

## 2022-11-19 NOTE — Telephone Encounter (Signed)
Patient would like a call back from [place]. Best callback is [phone].

## 2022-11-19 NOTE — Telephone Encounter (Signed)
Tried calling pt no answer and no vm. Will send mychart msg..Andee Poles

## 2022-11-19 NOTE — Addendum Note (Signed)
Addended by: Earnstine Regal on: 11/19/2022 03:50 PM   Modules accepted: Orders

## 2022-11-20 ENCOUNTER — Telehealth: Payer: Self-pay | Admitting: *Deleted

## 2022-11-20 NOTE — Telephone Encounter (Signed)
-----   Message from Adele Dan, CPhT sent at 11/20/2022 11:27 AM EDT ----- Regarding: PAP FOR RYBELSUS I have received a letter from [employer] asking for dose increase but it has your name on it and we did not do his initial application just checking to see if you had already received this notice as well at you office for that pt. I did not see any of his application from [facility] in media of the chart. If you have it to change or if you have not already done so I will scan a copy of the letter of what they are asking for to chart.    [facility] Patient Advocate [phone]

## 2022-11-20 NOTE — Telephone Encounter (Signed)
Pt done pt assistance w/ Novo him self. Did spoke to him on yesterday and faxed [facility]isk a refill request for pt Rybelus 7mg ..Med is on Med list../lmb

## 2022-11-21 ENCOUNTER — Encounter: Payer: Self-pay | Admitting: Cardiology

## 2022-11-21 ENCOUNTER — Ambulatory Visit: Payer: Medicare Other | Attending: Cardiology | Admitting: Cardiology

## 2022-11-21 VITALS — BP 112/55 | HR 72 | Ht 72.0 in | Wt 239.0 lb

## 2022-11-21 DIAGNOSIS — E1159 Type 2 diabetes mellitus with other circulatory complications: Secondary | ICD-10-CM | POA: Insufficient documentation

## 2022-11-21 DIAGNOSIS — I5042 Chronic combined systolic (congestive) and diastolic (congestive) heart failure: Secondary | ICD-10-CM | POA: Insufficient documentation

## 2022-11-21 DIAGNOSIS — I6523 Occlusion and stenosis of bilateral carotid arteries: Secondary | ICD-10-CM | POA: Diagnosis not present

## 2022-11-21 DIAGNOSIS — I25709 Atherosclerosis of coronary artery bypass graft(s), unspecified, with unspecified angina pectoris: Secondary | ICD-10-CM | POA: Diagnosis not present

## 2022-11-21 DIAGNOSIS — I10 Essential (primary) hypertension: Secondary | ICD-10-CM | POA: Insufficient documentation

## 2022-11-21 NOTE — Patient Instructions (Signed)
Medication Instructions:  Your physician recommends that you continue on your current medications as directed. Please refer to the Current Medication list given to you today.  *If you need a refill on your cardiac medications before your next appointment, please call your pharmacy*   Follow-Up: At [facility], you and your health needs are our priority.  As part of our continuing mission to provide you with exceptional heart care, we have created designated Provider Care Teams.  These Care Teams include your primary Cardiologist (physician) and Advanced Practice Providers (APPs -  Physician Assistants and Nurse Practitioners) who all work together to provide you with the care you need, when you need it.   Your next appointment:   1 year(s)  Provider:   Dr. Skains   

## 2022-11-21 NOTE — Progress Notes (Unsigned)
Cardiology Office Note:    Date:  11/21/2022   ID:  Larry [name], DOB 08/04/1941, MRN [id number]  PCP:  Cassandria Anger, MD   [facility] Providers Cardiologist:  None Sleep Medicine:  Fransico Him, MD     Referring MD: Cassandria Anger, MD    History of Present Illness:    Larry [name] is a 82 y.o. male with CAD, coronary bypass grafting 2018 (LIMA to LAD, SVG to Ramus Intermediate, SVG to OM, and SVG to RCA), obesity, type 2 diabetes, chronic diastolic heart failure (most recent hospital stay August 26, 2019) EF 45%-->60%, carotid artery disease status post left carotid endarterectomy January 2019, obstructive sleep apnea compliant with CPAP.  He developed yeast infection on Farxiga and it had to be discontinued after a short course of therapy.   Does have some lower extremity edema.  Seems to doing better with this.  He does enjoy golf.  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  Denies any fevers chills nausea vomiting syncope bleeding.  He has had more fatigue than usual he thinks.  Recent hemoglobin and TSH were within normal range.  Encouraged daily exercise.  Past Medical History:  Diagnosis Date   Carotid stenosis, left    Cataracts, bilateral    removed   Coronary artery disease    Diabetes    Heart valve problem    Hyperlipemia    Prostate CA    Sleep apnea    Dr Ledora Bottcher ; uses CPAP   Stroke    Tubular adenoma of colon 02/2016   Type II or unspecified type diabetes mellitus without mention of complication, not stated as uncontrolled    Unspecified essential hypertension     Past Surgical History:  Procedure Laterality Date   APPENDECTOMY     CARDIAC CATHETERIZATION     CATARACT EXTRACTION, BILATERAL     COLONOSCOPY     CORONARY ARTERY BYPASS GRAFT N/A 06/27/2017   Procedure: CORONARY ARTERY BYPASS GRAFTING (CABG), time four   using the left internal mammary artery, and right saphenous leg  vein harvested endoscopically.;  Surgeon: Ivin Poot, MD;  Location[facility];  Service: Open Heart Surgery;  Laterality: N/A;   CORONARY ULTRASOUND/IVUS N/A 06/24/2017   Procedure: Intravascular Ultrasound/IVUS;  Surgeon: Belva Crome, MD;  Location: [facility];  Service: Cardiovascular;  Laterality: N/A;  LEFT MAIN   CYSTOSCOPY N/A 06/27/2017   Procedure: CYSTOSCOPY FLEXIBLE, Balloon dilatation placement of foley catheter;  Surgeon: Ivin Poot, MD;  Location: [facility];  Service: Open Heart Surgery;  Laterality: N/A;   ENDARTERECTOMY Left 08/22/2017   Procedure: ENDARTERECTOMY CAROTID LEFT;  Surgeon: Serafina Mitchell, MD;  Location[facility];  Service: Vascular;  Laterality: Left;   INGUINAL HERNIA REPAIR Left    LEFT HEART CATH AND CORONARY ANGIOGRAPHY N/A 06/24/2017   Procedure: LEFT HEART CATH AND CORONARY ANGIOGRAPHY;  Surgeon: Belva Crome, MD;  Location: [facility];  Service: Cardiovascular;  Laterality: N/A;   PATCH ANGIOPLASTY Left 08/22/2017   Procedure: PATCH ANGIOPLASTY USING Rueben Bash BIOLOGIC PATCH;  Surgeon: Serafina Mitchell, MD;  Location: [facility];  Service: Vascular;  Laterality: Left;   PROSTATECTOMY     TEE WITHOUT CARDIOVERSION N/A 06/27/2017   Procedure: TRANSESOPHAGEAL ECHOCARDIOGRAM (TEE);  Surgeon: Prescott Gum, Collier Salina, MD;  Location: [facility];  Service: Open Heart Surgery;  Laterality: N/A;   TONSILLECTOMY     ULTRASOUND GUIDANCE FOR  VASCULAR ACCESS  06/24/2017   Procedure: Ultrasound Guidance For Vascular Access;  Surgeon: Belva Crome, MD;  Location: [facility];  Service: Cardiovascular;;    Current Medications: Current Meds  Medication Sig   aspirin EC 81 MG tablet Take 1 tablet (81 mg total) by mouth daily.   Blood Glucose Monitoring Suppl (RELION CONFIRM GLUCOSE MONITOR) w/Device KIT Use as directed   carvedilol (COREG) 12.5 MG tablet Take 1 tablet (12.5 mg total) by mouth 2 (two) times daily with a meal.   Cholecalciferol (VITAMIN D3)  1000 UNITS CAPS Take 1,000 Units by mouth daily.   clotrimazole (LOTRIMIN) 1 % cream Apply 1 application topically 2 (two) times daily.   Cyanocobalamin (VITAMIN B-12) 1000 MCG SUBL Place 1 tablet (1,000 mcg total) under the tongue daily.   fluconazole (DIFLUCAN) 100 MG tablet Take 1 tablet by mouth once daily   fluorouracil (EFUDEX) 5 % cream Apply topically 2 (two) times daily.   fluticasone (FLONASE) 50 MCG/ACT nasal spray Place 2 sprays into both nostrils daily.   folic acid (FOLVITE) 1 MG tablet Take 1 tablet (1 mg total) by mouth daily.   furosemide (LASIX) 40 MG tablet Take 1 tablet by mouth once daily   glucose blood (RELION GLUCOSE TEST STRIPS) test strip 1 each by Other route as needed for other. Use to check blood sugars twice a day   hydrALAZINE (APRESOLINE) 25 MG tablet Take 1 tablet (25 mg total) by mouth 3 (three) times daily.   hydrocortisone cream 1 % Apply 1 application topically 2 (two) times daily.   ketoconazole (NIZORAL) 2 % cream Apply 1 Application topically 2 (two) times daily.   loratadine (CLARITIN) 10 MG tablet TAKE 1 TABLET BY MOUTH ONCE DAILY FOR  1  MONTH  THEN  AS  NEEDED   metFORMIN (GLUCOPHAGE) 1000 MG tablet Take 1 tablet (1,000 mg total) by mouth 2 (two) times daily with a meal.   Multiple Vitamins-Minerals (MACULAR VITAMIN BENEFIT) TABS Take 1 tablet by mouth 2 (two) times daily.   nitroGLYCERIN (NITROSTAT) 0.4 MG SL tablet Place 1 tablet (0.4 mg total) under the tongue every 5 (five) minutes as needed for chest pain.   PREVIDENT 5000 DRY MOUTH 1.1 % GEL dental gel    ReliOn Lancet Devices 30G MISC Use to check blood sugars twice a day   repaglinide (PRANDIN) 1 MG tablet Take 1 tablet (1 mg total) by mouth 3 (three) times daily with meals.   Semaglutide (RYBELSUS) 7 MG TABS Take 1 tablet (7 mg total) by mouth daily.   simvastatin (ZOCOR) 40 MG tablet Take 1 tablet (40 mg total) by mouth every evening. Follow-up appt w/labs is due must see provider for future  refills   spironolactone (ALDACTONE) 25 MG tablet Take 0.5 tablets (12.5mg  total) by mouth daily   thiamine 100 MG tablet Take 1 tablet (100 mg total) by mouth daily.     Allergies:   Amlodipine, Verapamil, Clindamycin hcl, Farxiga [dapagliflozin], and Contrast media [iodinated contrast media]   Social History   Socioeconomic History   Marital status: Married    Spouse name: Not on file   Number of children: 4   Years of education: Not on file   Highest education level: Bachelor's degree (e.g., BA, AB, BS)  Occupational History   Occupation: Salesman    Comment: retired  Tobacco Use   Smoking status: Never   Smokeless tobacco: Never  Vaping Use   Vaping Use: Never used  Substance  and Sexual Activity   Alcohol use: No    Alcohol/week: 0.0 standard drinks of alcohol   Drug use: No   Sexual activity: Not on file  Other Topics Concern   Not on file  Social History Narrative   Regular Exercise- no, golf   Social Determinants of Health   Financial Resource Strain: Low Risk  (05/18/2022)   Overall Financial Resource Strain (CARDIA)    Difficulty of Paying Living Expenses: Not hard at all  Food Insecurity: No Food Insecurity (05/18/2022)   Hunger Vital Sign    Worried About Running Out of Food in the Last Year: Never true    Ran Out of Food in the Last Year: Never true  Transportation Needs: No Transportation Needs (05/18/2022)   PRAPARE - Hydrologist (Medical): No    Lack of Transportation (Non-Medical): No  Physical Activity: Insufficiently Active (05/18/2022)   Exercise Vital Sign    Days of Exercise per Week: 3 days    Minutes of Exercise per Session: 30 min  Stress: No Stress Concern Present (05/18/2022)   [place]    Feeling of Stress : Not at all  Social Connections: [place] (05/18/2022)   Social Connection and Isolation Panel [NHANES]    Frequency of  Communication with Friends and Family: More than three times a week    Frequency of Social Gatherings with Friends and Family: More than three times a week    Attends Religious Services: More than 4 times per year    Active Member of [employer] or Organizations: Yes    Attends Music therapist: More than 4 times per year    Marital Status: Married     Family History: The patient's family history includes Diabetes in an other family member; Heart attack in his brother; Heart disease in his father and mother; Hypertension in an other family member. There is no history of Colon cancer, Esophageal cancer, Rectal cancer, or Stomach cancer.  ROS:   Please see the history of present illness.     All other systems reviewed and are negative.  EKGs/Labs/Other Studies Reviewed:    The following studies were reviewed today:  ECHOCARDIOGRAM 03/26/2022: IMPRESSIONS   1. Left ventricular ejection fraction, by estimation, is 55 to 60%. The  left ventricle has normal function. The left ventricle has no regional  wall motion abnormalities. There is mild asymmetric left ventricular  hypertrophy of the basal-septal segment.  Left ventricular diastolic parameters are consistent with Grade II  diastolic dysfunction (pseudonormalization).   2. Right ventricular systolic function is normal. The right ventricular  size is mildly enlarged. There is mildly elevated pulmonary artery  systolic pressure. The estimated right ventricular systolic pressure is  [id number] mmHg.   3. Left atrial size was mild to moderately dilated.   4. The mitral valve is grossly normal. Mild mitral valve regurgitation.  No evidence of mitral stenosis.   5. The aortic valve is tricuspid. Aortic valve regurgitation is not  visualized. Aortic valve sclerosis is present, with no evidence of aortic  valve stenosis.   6. The inferior vena cava is normal in size with greater than 50%  respiratory variability, suggesting right atrial  pressure of 3 mmHg.   Comparison(s): No significant change from prior study.  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  Recent Labs: 03/22/2022: Hemoglobin 13.1; Platelets 219 05/09/2022: NT-Pro BNP 485 08/10/2022: BUN 28; Creatinine, Ser 1.35; Potassium 4.8; Sodium 139  Recent Lipid Panel    Component Value Date/Time   CHOL 125 06/16/2020 1019   CHOL 137 09/19/2018 0743   TRIG 99.0 06/16/2020 1019   HDL 41.10 06/16/2020 1019   HDL 39 (L) 09/19/2018 0743   CHOLHDL 3 06/16/2020 1019   VLDL 19.8 06/16/2020 1019   LDLCALC 64 06/16/2020 1019   LDLCALC 67 09/19/2018 0743     Risk Assessment/Calculations:              Physical Exam:    VS:  BP (!) 112/55 (BP Location: Left Arm, Patient Position: Sitting, Cuff Size: Normal)   Pulse 72   Ht 6' (1.829 m)   Wt 239 lb (108.4 kg)   BMI 32.41 kg/m     Wt Readings from Last 3 Encounters:  11/21/22 239 lb (108.4 kg)  09/13/22 244 lb (110.7 kg)  07/31/22 252 lb (114.3 kg)     GEN:  Well nourished, well developed in no acute distress HEENT: Normal NECK: No JVD; No carotid bruits LYMPHATICS: No lymphadenopathy CARDIAC: Occasional ectopy RRR, no murmurs, rubs, gallops RESPIRATORY:  Clear to auscultation without rales, wheezing or rhonchi  ABDOMEN: Soft, non-tender, non-distended MUSCULOSKELETAL:  No edema; No deformity  SKIN: Warm and dry NEUROLOGIC:  Alert and oriented x 3 PSYCHIATRIC:  Normal affect   ASSESSMENT:    1. Coronary artery disease involving coronary bypass graft of native heart with angina pectoris   2. Chronic combined systolic and diastolic heart failure   3. Essential hypertension   4. Type 2 diabetes mellitus with other circulatory complication, without long-term current use of insulin   5. Bilateral carotid artery stenosis    PLAN:    In order of problems listed above:  Coronary artery disease with bypass -Denies any anginal symptoms.  No nitroglycerin use.  Continue with  exercise.  Chronic Systolic and diastolic heart failure -Had recovery of systolic function but has persistent diastolic dysfunction.  Was not able to tolerate SGLT2 inhibitor secondary to yeast infection and balanitis. - Dr. Tamala Julian started spironolactone 12.5 mg a day.  Repeated basic metabolic profile.  Second-degree AV block type I - Currently heart rate is stable.  Phenomenon seen on EKG.  Right bundle branch block.  Will be careful with his carvedilol currently at 12.5 twice a day.  If syncope or worsening bradycardias occur, we will discontinue or decrease dosage.  Essential hypertension with diabetes and dyslipidemia - Followed by Dr. Alain Marion -Has been on simvastatin 40 mg.  Bilateral carotid artery stenosis -Continue with secondary prevention.  Prior left carotid endarterectomy. Dr. Trula Slade.          Medication Adjustments/Labs and Tests Ordered: Current medicines are reviewed at length with the patient today.  Concerns regarding medicines are outlined above.  Orders Placed This Encounter  Procedures   EKG 12-Lead   No orders of the defined types were placed in this encounter.   Patient Instructions  Medication Instructions:  Your physician recommends that you continue on your current medications as directed. Please refer to the Current Medication list given to you today.  *If you need a refill on your cardiac medications before your next appointment, please call your pharmacy*  Follow-Up: At [facility], you and your health needs are our priority.  As part of our continuing mission to provide you with exceptional heart care, we have created designated Provider Care Teams.  These Care Teams include your primary Cardiologist (physician) and Advanced Practice Providers (APPs -  Physician Assistants and Nurse  Practitioners) who all work together to provide you with the care you need, when you need it.   Your next appointment:   1 year(s)  Provider:   Dr.  Marlou Porch     Signed, Candee Furbish, MD  11/21/2022 10:27 AM    [place]

## 2022-12-05 ENCOUNTER — Telehealth: Payer: Self-pay | Admitting: Radiology

## 2022-12-05 NOTE — Telephone Encounter (Signed)
Spoke with pts wife and she confirmed understanding that rybelsus was ready to be picked up at the front and will let him know.

## 2022-12-11 ENCOUNTER — Ambulatory Visit (INDEPENDENT_AMBULATORY_CARE_PROVIDER_SITE_OTHER): Payer: Medicare Other | Admitting: Internal Medicine

## 2022-12-11 ENCOUNTER — Encounter: Payer: Self-pay | Admitting: Internal Medicine

## 2022-12-11 VITALS — BP 128/60 | HR 86 | Temp 98.6°F | Ht 72.0 in | Wt 236.0 lb

## 2022-12-11 DIAGNOSIS — E538 Deficiency of other specified B group vitamins: Secondary | ICD-10-CM | POA: Diagnosis not present

## 2022-12-11 DIAGNOSIS — E1159 Type 2 diabetes mellitus with other circulatory complications: Secondary | ICD-10-CM | POA: Diagnosis not present

## 2022-12-11 DIAGNOSIS — R600 Localized edema: Secondary | ICD-10-CM

## 2022-12-11 DIAGNOSIS — I10 Essential (primary) hypertension: Secondary | ICD-10-CM | POA: Diagnosis not present

## 2022-12-11 LAB — COMPREHENSIVE METABOLIC PANEL
ALT: 11 U/L (ref 0–53)
AST: 12 U/L (ref 0–37)
Albumin: 4.2 g/dL (ref 3.5–5.2)
Alkaline Phosphatase: 74 U/L (ref 39–117)
BUN: 33 mg/dL — ABNORMAL HIGH (ref 6–23)
CO2: 29 mEq/L (ref 19–32)
Calcium: 9.5 mg/dL (ref 8.4–10.5)
Chloride: 103 mEq/L (ref 96–112)
Creatinine, Ser: 1.55 mg/dL — ABNORMAL HIGH (ref 0.40–1.50)
GFR: 41.84 mL/min — ABNORMAL LOW (ref >60.00)
Glucose, Bld: 144 mg/dL — ABNORMAL HIGH (ref 70–99)
Potassium: 4.7 mEq/L (ref 3.5–5.1)
Sodium: 140 mEq/L (ref 135–145)
Total Bilirubin: 0.5 mg/dL (ref 0.2–1.2)
Total Protein: 6.9 g/dL (ref 6.0–8.3)

## 2022-12-11 LAB — HEMOGLOBIN A1C: Hgb A1c MFr Bld: 7.3 % — ABNORMAL HIGH (ref 4.6–6.5)

## 2022-12-11 MED ORDER — KETOCONAZOLE 2 % EX CREA: 1.0000 | TOPICAL_CREAM | Freq: Two times a day (BID) | CUTANEOUS | 1 refills | Status: DC

## 2022-12-11 MED ORDER — RELION BLOOD GLUCOSE TEST VI STRP: 1.0000 | ORAL_STRIP | 11 refills | Status: AC | PRN

## 2022-12-11 MED ORDER — RYBELSUS 14 MG PO TABS: 14.0000 mg | ORAL_TABLET | Freq: Every day | ORAL | 3 refills | Status: DC

## 2022-12-11 NOTE — Assessment & Plan Note (Signed)
LE edema - better on Aldactone+ Furosemide

## 2022-12-11 NOTE — Assessment & Plan Note (Signed)
On B12 

## 2022-12-11 NOTE — Assessment & Plan Note (Signed)
BP Readings from Last 3 Encounters:  12/11/22 128/60  11/21/22 (!) 112/55  09/13/22 122/68

## 2022-12-11 NOTE — Progress Notes (Signed)
Subjective:  Patient ID: Larry [name], male    DOB: 03-13-1941  Age: 82 y.o. MRN: [id number]  CC: No chief complaint on file.   HPI Larry [name] presents for LE edema - better on Aldactone+ Furosemide F/u DM, CAD, HTN Rybelsus is working   Outpatient Medications Prior to Visit  Medication Sig Dispense Refill   aspirin EC 81 MG tablet Take 1 tablet (81 mg total) by mouth daily. 100 tablet 3   Blood Glucose Monitoring Suppl (RELION CONFIRM GLUCOSE MONITOR) w/Device KIT Use as directed 1 kit 0   carvedilol (COREG) 12.5 MG tablet Take 1 tablet (12.5 mg total) by mouth 2 (two) times daily with a meal. 180 tablet 3   Cholecalciferol (VITAMIN D3) 1000 UNITS CAPS Take 1,000 Units by mouth daily.     clotrimazole (LOTRIMIN) 1 % cream Apply 1 application topically 2 (two) times daily.     Cyanocobalamin (VITAMIN B-12) 1000 MCG SUBL Place 1 tablet (1,000 mcg total) under the tongue daily. 100 tablet 3   fluconazole (DIFLUCAN) 100 MG tablet Take 1 tablet by mouth once daily 14 tablet 0   fluorouracil (EFUDEX) 5 % cream Apply topically 2 (two) times daily.     folic acid (FOLVITE) 1 MG tablet Take 1 tablet (1 mg total) by mouth daily. 90 tablet 3   furosemide (LASIX) 40 MG tablet Take 1 tablet by mouth once daily 90 tablet 3   hydrALAZINE (APRESOLINE) 25 MG tablet Take 1 tablet (25 mg total) by mouth 3 (three) times daily. 270 tablet 3   hydrocortisone cream 1 % Apply 1 application topically 2 (two) times daily.     loratadine (CLARITIN) 10 MG tablet TAKE 1 TABLET BY MOUTH ONCE DAILY FOR  1  MONTH  THEN  AS  NEEDED 100 tablet 0   metFORMIN (GLUCOPHAGE) 1000 MG tablet Take 1 tablet (1,000 mg total) by mouth 2 (two) times daily with a meal. 180 tablet 3   Multiple Vitamins-Minerals (MACULAR VITAMIN BENEFIT) TABS Take 1 tablet by mouth 2 (two) times daily.     nitroGLYCERIN (NITROSTAT) 0.4 MG SL tablet Place 1 tablet (0.4 mg total) under the tongue every 5 (five) minutes as needed for chest  pain. 20 tablet 3   PREVIDENT 5000 DRY MOUTH 1.1 % GEL dental gel      ReliOn Lancet Devices 30G MISC Use to check blood sugars twice a day 180 each 1   repaglinide (PRANDIN) 1 MG tablet Take 1 tablet (1 mg total) by mouth 3 (three) times daily with meals. 270 tablet 3   simvastatin (ZOCOR) 40 MG tablet Take 1 tablet (40 mg total) by mouth every evening. Follow-up appt w/labs is due must see provider for future refills 90 tablet 0   spironolactone (ALDACTONE) 25 MG tablet Take 0.5 tablets (12.5mg  total) by mouth daily 50 tablet 3   thiamine 100 MG tablet Take 1 tablet (100 mg total) by mouth daily. 90 tablet 3   glucose blood (RELION GLUCOSE TEST STRIPS) test strip 1 each by Other route as needed for other. Use to check blood sugars twice a day 100 each 11   ketoconazole (NIZORAL) 2 % cream Apply 1 Application topically 2 (two) times daily. 45 g 1   Semaglutide (RYBELSUS) 7 MG TABS Take 1 tablet (7 mg total) by mouth daily. 120 tablet 9   fluticasone (FLONASE) 50 MCG/ACT nasal spray Place 2 sprays into both nostrils daily. 1 g 0   No facility-administered medications prior  to visit.    ROS: Review of Systems  Constitutional:  Positive for fatigue. Negative for appetite change and unexpected weight change.  HENT:  Negative for congestion, nosebleeds, sneezing, sore throat and trouble swallowing.   Eyes:  Negative for itching and visual disturbance.  Respiratory:  Negative for cough.   Cardiovascular:  Negative for chest pain, palpitations and leg swelling.  Gastrointestinal:  Negative for abdominal distention, blood in stool, diarrhea and nausea.  Genitourinary:  Negative for frequency and hematuria.  Musculoskeletal:  Negative for back pain, gait problem, joint swelling and neck pain.  Skin:  Negative for rash.  Neurological:  Negative for dizziness, tremors, speech difficulty and weakness.  Psychiatric/Behavioral:  Negative for agitation, dysphoric mood, sleep disturbance and suicidal  ideas. The patient is not nervous/anxious.     Objective:  BP 128/60 (BP Location: Right Arm, Patient Position: Sitting, Cuff Size: Normal)   Pulse 86   Temp 98.6 F (37 C) (Oral)   Ht 6' (1.829 m)   Wt 236 lb (107 kg)   BMI 32.01 kg/m   BP Readings from Last 3 Encounters:  12/11/22 128/60  11/21/22 (!) 112/55  09/13/22 122/68    Wt Readings from Last 3 Encounters:  12/11/22 236 lb (107 kg)  11/21/22 239 lb (108.4 kg)  09/13/22 244 lb (110.7 kg)    Physical Exam Constitutional:      General: He is not in acute distress.    Appearance: He is well-developed. He is obese.     Comments: NAD  Eyes:     Conjunctiva/sclera: Conjunctivae normal.     Pupils: Pupils are equal, round, and reactive to light.  Neck:     Thyroid: No thyromegaly.     Vascular: No JVD.  Cardiovascular:     Rate and Rhythm: Normal rate and regular rhythm.     Heart sounds: Normal heart sounds. No murmur heard.    No friction rub. No gallop.  Pulmonary:     Effort: Pulmonary effort is normal. No respiratory distress.     Breath sounds: Normal breath sounds. No wheezing or rales.  Chest:     Chest wall: No tenderness.  Abdominal:     General: Bowel sounds are normal. There is no distension.     Palpations: Abdomen is soft. There is no mass.     Tenderness: There is no abdominal tenderness. There is no guarding or rebound.  Musculoskeletal:        General: No tenderness. Normal range of motion.     Cervical back: Normal range of motion.  Lymphadenopathy:     Cervical: No cervical adenopathy.  Skin:    General: Skin is warm and dry.     Findings: No rash.  Neurological:     Mental Status: He is alert and oriented to person, place, and time.     Cranial Nerves: No cranial nerve deficit.     Motor: No abnormal muscle tone.     Coordination: Coordination normal.     Gait: Gait normal.     Deep Tendon Reflexes: Reflexes are normal and symmetric.  Psychiatric:        Behavior: Behavior normal.         Thought Content: Thought content normal.        Judgment: Judgment normal.   No edema!  Lab Results  Component Value Date   WBC 9.8 03/22/2022   HGB 13.1 03/22/2022   HCT 41.0 03/22/2022   PLT 219 03/22/2022   GLUCOSE  279 (H) 08/10/2022   CHOL 125 06/16/2020   TRIG 99.0 06/16/2020   HDL 41.10 06/16/2020   LDLCALC 64 06/16/2020   ALT 13 10/18/2021   AST 16 10/18/2021   NA 139 08/10/2022   K 4.8 08/10/2022   CL 98 08/10/2022   CREATININE 1.35 (H) 08/10/2022   BUN 28 (H) 08/10/2022   CO2 25 08/10/2022   TSH 2.28 01/20/2021   PSA 0.00 Repeated and verified X2. (L) 12/26/2012   INR 1.0 10/18/2021   HGBA1C 6.9 (H) 05/09/2022    CT Head Wo Contrast  Result Date: 01/03/2022 CLINICAL DATA:  Head trauma, fell EXAM: CT HEAD WITHOUT CONTRAST TECHNIQUE: Contiguous axial images were obtained from the base of the skull through the vertex without intravenous contrast. RADIATION DOSE REDUCTION: This exam was performed according to the departmental dose-optimization program which includes automated exposure control, adjustment of the mA and/or kV according to patient size and/or use of iterative reconstruction technique. COMPARISON:  10/18/2021 FINDINGS: Brain: No acute infarct or hemorrhage. 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 Lateral ventricles and midline structures are otherwise unremarkable. No acute extra-axial fluid collections. No mass effect. Vascular: Stable atherosclerosis.  No hyperdense vessel. Skull: Normal. Negative for fracture or focal lesion. Sinuses/Orbits: No acute finding. Other: None. IMPRESSION: 1. No acute intracranial process. Electronically Signed   By: Sharlet Salina M.D.   On: 01/03/2022 23:06   DG Hand Complete Right  Result Date: 01/03/2022 CLINICAL DATA:  Cough.  Fall. EXAM: RIGHT HAND - COMPLETE 3+ VIEW COMPARISON:  None Available. FINDINGS: There is no evidence of fracture or dislocation. There is no evidence  of arthropathy or other focal bone abnormality. Soft tissues are unremarkable. IMPRESSION: Negative. Electronically Signed   By: Darliss Cheney M.D.   On: 01/03/2022 23:01   DG Chest 2 View  Result Date: 01/03/2022 CLINICAL DATA:  Cough. EXAM: CHEST - 2 VIEW COMPARISON:  Chest x-ray 11/22/2018. FINDINGS: Sternotomy wires are present. The heart is enlarged, unchanged. Lungs are clear. There is no pleural effusion or pneumothorax. No acute fractures are seen. IMPRESSION: 1. No acute cardiopulmonary process. 2. Stable cardiomegaly. Electronically Signed   By: Darliss Cheney M.D.   On: 01/03/2022 23:00    Assessment & Plan:   Problem List Items Addressed This Visit       Cardiovascular and Mediastinum   Uncontrolled hypertension    BP Readings from Last 3 Encounters:  12/11/22 128/60  11/21/22 (!) 112/55  09/13/22 122/68          Endocrine   DM2 (diabetes mellitus, type 2)    Rybelsus is working - increase to 14 mg/d      Relevant Medications   Semaglutide (RYBELSUS) 14 MG TABS     Other   Edema - Primary    LE edema - better on Aldactone+ Furosemide      Low serum vitamin B12    On B12         Meds ordered this encounter  Medications   glucose blood (RELION GLUCOSE TEST STRIPS) test strip    Sig: 1 each by Other route as needed for other. Use to check blood sugars twice a day    Dispense:  100 each    Refill:  11   ketoconazole (NIZORAL) 2 % cream    Sig: Apply 1 Application topically 2 (two) times daily.    Dispense:  45 g    Refill:  1   Semaglutide (RYBELSUS) 14  MG TABS    Sig: Take 1 tablet (14 mg total) by mouth daily.    Dispense:  90 tablet    Refill:  3      Follow-up: Return in about 3 months (around 03/12/2023) for a follow-up visit.  Sonda Primes, MD

## 2022-12-11 NOTE — Assessment & Plan Note (Signed)
Rybelsus is working - increase to 14 mg/d

## 2022-12-17 DIAGNOSIS — T85698A Other mechanical complication of other specified internal prosthetic devices, implants and grafts, initial encounter: Secondary | ICD-10-CM | POA: Diagnosis not present

## 2023-01-24 ENCOUNTER — Telehealth: Payer: Self-pay | Admitting: *Deleted

## 2023-01-24 MED ORDER — RYBELSUS 14 MG PO TABS: 14.0000 mg | ORAL_TABLET | Freq: Every day | ORAL | 0 refills | Status: DC

## 2023-01-24 NOTE — Telephone Encounter (Signed)
Rec'd patient assistant forms to be fill out so pt can rec'd his Rybelsus 14 mg. Completed form place in MD purple folder to complete.Marland KitchenRaechel Chute

## 2023-01-28 NOTE — Telephone Encounter (Signed)
The form was signed.  Thanks

## 2023-01-28 NOTE — Telephone Encounter (Signed)
Faxed form to [employer] @ [phone].../l,mb

## 2023-02-27 ENCOUNTER — Other Ambulatory Visit: Payer: Self-pay | Admitting: *Deleted

## 2023-02-27 MED ORDER — RYBELSUS 14 MG PO TABS: 14.0000 mg | ORAL_TABLET | Freq: Every day | ORAL | 5 refills | Status: AC

## 2023-02-27 NOTE — Telephone Encounter (Signed)
Rec'd fax stating auto-refill for the patient has fulfilled for following " Rybelsus 14 mg" #120 day supply should arrived to the office 10-14 days. This pt has no more refills and assistance will expire 08/20/23. Fill out form place on MD desk to sign.Marland KitchenRaechel Chute

## 2023-02-28 NOTE — Telephone Encounter (Signed)
MD sign form has been faxed back to [employer] Pt Assistant .Marland KitchenRaechel Chute

## 2023-03-06 ENCOUNTER — Telehealth: Payer: Self-pay

## 2023-03-06 NOTE — Telephone Encounter (Signed)
Patient assistance for Ryblesus 14 mg arrived today.  Patient informed of pick up and will retrieve during his office visit next week. Note placed in OV note for reminder.

## 2023-03-12 ENCOUNTER — Encounter: Payer: Self-pay | Admitting: Internal Medicine

## 2023-03-12 ENCOUNTER — Ambulatory Visit (INDEPENDENT_AMBULATORY_CARE_PROVIDER_SITE_OTHER): Payer: Medicare Other | Admitting: Internal Medicine

## 2023-03-12 VITALS — BP 122/62 | HR 82 | Temp 97.6°F | Ht 72.0 in | Wt 240.0 lb

## 2023-03-12 DIAGNOSIS — N183 Chronic kidney disease, stage 3 unspecified: Secondary | ICD-10-CM | POA: Diagnosis not present

## 2023-03-12 DIAGNOSIS — Z8546 Personal history of malignant neoplasm of prostate: Secondary | ICD-10-CM

## 2023-03-12 DIAGNOSIS — E669 Obesity, unspecified: Secondary | ICD-10-CM

## 2023-03-12 DIAGNOSIS — E1159 Type 2 diabetes mellitus with other circulatory complications: Secondary | ICD-10-CM

## 2023-03-12 DIAGNOSIS — I10 Essential (primary) hypertension: Secondary | ICD-10-CM

## 2023-03-12 DIAGNOSIS — N481 Balanitis: Secondary | ICD-10-CM

## 2023-03-12 DIAGNOSIS — Z23 Encounter for immunization: Secondary | ICD-10-CM | POA: Diagnosis not present

## 2023-03-12 DIAGNOSIS — Z7984 Long term (current) use of oral hypoglycemic drugs: Secondary | ICD-10-CM | POA: Diagnosis not present

## 2023-03-12 DIAGNOSIS — I5042 Chronic combined systolic (congestive) and diastolic (congestive) heart failure: Secondary | ICD-10-CM

## 2023-03-12 DIAGNOSIS — E538 Deficiency of other specified B group vitamins: Secondary | ICD-10-CM | POA: Diagnosis not present

## 2023-03-12 LAB — COMPREHENSIVE METABOLIC PANEL
ALT: 12 U/L (ref 0–53)
AST: 12 U/L (ref 0–37)
Albumin: 4.5 g/dL (ref 3.5–5.2)
Alkaline Phosphatase: 69 U/L (ref 39–117)
BUN: 36 mg/dL — ABNORMAL HIGH (ref 6–23)
CO2: 24 mEq/L (ref 19–32)
Calcium: 9.9 mg/dL (ref 8.4–10.5)
Chloride: 103 mEq/L (ref 96–112)
Creatinine, Ser: 1.49 mg/dL (ref 0.40–1.50)
GFR: 43.79 mL/min — ABNORMAL LOW (ref >60.00)
Glucose, Bld: 173 mg/dL — ABNORMAL HIGH (ref 70–99)
Potassium: 4.5 mEq/L (ref 3.5–5.1)
Sodium: 139 mEq/L (ref 135–145)
Total Bilirubin: 0.5 mg/dL (ref 0.2–1.2)
Total Protein: 7.3 g/dL (ref 6.0–8.3)

## 2023-03-12 LAB — HEMOGLOBIN A1C: Hgb A1c MFr Bld: 7.1 % — ABNORMAL HIGH (ref 4.6–6.5)

## 2023-03-12 LAB — PSA: PSA: 0 ng/mL — ABNORMAL LOW (ref 0.10–4.00)

## 2023-03-12 MED ORDER — KETOCONAZOLE 2 % EX CREA: 1.0000 | TOPICAL_CREAM | Freq: Two times a day (BID) | CUTANEOUS | 1 refills | Status: DC

## 2023-03-12 NOTE — Assessment & Plan Note (Signed)
On B12 

## 2023-03-12 NOTE — Assessment & Plan Note (Signed)
Coreg 25 mg twice a day and hydralazine 50 mg 3 times a day. On Furosemide

## 2023-03-12 NOTE — Assessment & Plan Note (Signed)
Wt Readings from Last 3 Encounters:  03/12/23 240 lb (108.9 kg)  12/11/22 236 lb (107 kg)  11/21/22 239 lb (108.4 kg)

## 2023-03-12 NOTE — Assessment & Plan Note (Signed)
Recurrent  Farxiga caused a lot of yeast infection - [name] stopped 2 mo ago  Ketoconazole cream bid

## 2023-03-12 NOTE — Assessment & Plan Note (Signed)
Rybelsus is working - increase to 14 mg/d

## 2023-03-12 NOTE — Progress Notes (Signed)
Subjective:  Patient ID: Larry [name], male    DOB: 1941/04/23  Age: 82 y.o. MRN: [id number]  CC: Medical Management of Chronic Issues (3 month f/u )   HPI Larry [name] presents for DM, HTN, edema   Outpatient Medications Prior to Visit  Medication Sig Dispense Refill   aspirin EC 81 MG tablet Take 1 tablet (81 mg total) by mouth daily. 100 tablet 3   Blood Glucose Monitoring Suppl (RELION CONFIRM GLUCOSE MONITOR) w/Device KIT Use as directed 1 kit 0   carvedilol (COREG) 12.5 MG tablet Take 1 tablet (12.5 mg total) by mouth 2 (two) times daily with a meal. 180 tablet 3   Cholecalciferol (VITAMIN D3) 1000 UNITS CAPS Take 1,000 Units by mouth daily.     Cyanocobalamin (VITAMIN B-12) 1000 MCG SUBL Place 1 tablet (1,000 mcg total) under the tongue daily. 100 tablet 3   fluconazole (DIFLUCAN) 100 MG tablet Take 1 tablet by mouth once daily 14 tablet 0   fluorouracil (EFUDEX) 5 % cream Apply topically 2 (two) times daily.     folic acid (FOLVITE) 1 MG tablet Take 1 tablet (1 mg total) by mouth daily. 90 tablet 3   furosemide (LASIX) 40 MG tablet Take 1 tablet by mouth once daily 90 tablet 3   glucose blood (RELION GLUCOSE TEST STRIPS) test strip 1 each by Other route as needed for other. Use to check blood sugars twice a day 100 each 11   hydrALAZINE (APRESOLINE) 25 MG tablet Take 1 tablet (25 mg total) by mouth 3 (three) times daily. 270 tablet 3   hydrocortisone cream 1 % Apply 1 application topically 2 (two) times daily.     loratadine (CLARITIN) 10 MG tablet TAKE 1 TABLET BY MOUTH ONCE DAILY FOR  1  MONTH  THEN  AS  NEEDED 100 tablet 0   metFORMIN (GLUCOPHAGE) 1000 MG tablet Take 1 tablet (1,000 mg total) by mouth 2 (two) times daily with a meal. 180 tablet 3   Multiple Vitamins-Minerals (MACULAR VITAMIN BENEFIT) TABS Take 1 tablet by mouth 2 (two) times daily.     nitroGLYCERIN (NITROSTAT) 0.4 MG SL tablet Place 1 tablet (0.4 mg total) under the tongue every 5 (five) minutes as  needed for chest pain. 20 tablet 3   PREVIDENT 5000 DRY MOUTH 1.1 % GEL dental gel      ReliOn Lancet Devices 30G MISC Use to check blood sugars twice a day 180 each 1   repaglinide (PRANDIN) 1 MG tablet Take 1 tablet (1 mg total) by mouth 3 (three) times daily with meals. 270 tablet 3   Semaglutide (RYBELSUS) 14 MG TABS Take 1 tablet (14 mg total) by mouth daily. Fax to [employer] Patient Assistance [phone]  Pt ID # [id number] 120 tablet 5   simvastatin (ZOCOR) 40 MG tablet Take 1 tablet (40 mg total) by mouth every evening. Follow-up appt w/labs is due must see provider for future refills 90 tablet 0   spironolactone (ALDACTONE) 25 MG tablet Take 0.5 tablets (12.5mg  total) by mouth daily 50 tablet 3   thiamine 100 MG tablet Take 1 tablet (100 mg total) by mouth daily. 90 tablet 3   clotrimazole (LOTRIMIN) 1 % cream Apply 1 application topically 2 (two) times daily.     ketoconazole (NIZORAL) 2 % cream Apply 1 Application topically 2 (two) times daily. 45 g 1   fluticasone (FLONASE) 50 MCG/ACT nasal spray Place 2 sprays into both nostrils daily. 1 g 0  No facility-administered medications prior to visit.    ROS: Review of Systems  Constitutional:  Negative for appetite change, fatigue and unexpected weight change.  HENT:  Negative for congestion, nosebleeds, sneezing, sore throat and trouble swallowing.   Eyes:  Negative for itching and visual disturbance.  Respiratory:  Negative for cough.   Cardiovascular:  Negative for chest pain, palpitations and leg swelling.  Gastrointestinal:  Negative for abdominal distention, blood in stool, diarrhea and nausea.  Genitourinary:  Negative for frequency and hematuria.  Musculoskeletal:  Negative for back pain, gait problem, joint swelling and neck pain.  Skin:  Negative for rash.  Neurological:  Negative for dizziness, tremors, speech difficulty and weakness.  Psychiatric/Behavioral:  Negative for agitation, dysphoric mood and sleep  disturbance. The patient is not nervous/anxious.     Objective:  BP 122/62 (BP Location: Left Arm, Patient Position: Sitting, Cuff Size: Large)   Pulse 82   Temp 97.6 F (36.4 C) (Temporal)   Ht 6' (1.829 m)   Wt 240 lb (108.9 kg)   SpO2 96%   BMI 32.55 kg/m   BP Readings from Last 3 Encounters:  03/12/23 122/62  12/11/22 128/60  11/21/22 (!) 112/55    Wt Readings from Last 3 Encounters:  03/12/23 240 lb (108.9 kg)  12/11/22 236 lb (107 kg)  11/21/22 239 lb (108.4 kg)    Physical Exam Constitutional:      General: He is not in acute distress.    Appearance: He is well-developed. He is obese.     Comments: NAD  Eyes:     Conjunctiva/sclera: Conjunctivae normal.     Pupils: Pupils are equal, round, and reactive to light.  Neck:     Thyroid: No thyromegaly.     Vascular: No JVD.  Cardiovascular:     Rate and Rhythm: Normal rate and regular rhythm.     Heart sounds: Normal heart sounds. No murmur heard.    No friction rub. No gallop.  Pulmonary:     Effort: Pulmonary effort is normal. No respiratory distress.     Breath sounds: Normal breath sounds. No wheezing or rales.  Chest:     Chest wall: No tenderness.  Abdominal:     General: Bowel sounds are normal. There is no distension.     Palpations: Abdomen is soft. There is no mass.     Tenderness: There is no abdominal tenderness. There is no guarding or rebound.  Musculoskeletal:        General: No tenderness. Normal range of motion.     Cervical back: Normal range of motion.  Lymphadenopathy:     Cervical: No cervical adenopathy.  Skin:    General: Skin is warm and dry.     Findings: No rash.  Neurological:     Mental Status: He is alert and oriented to person, place, and time.     Cranial Nerves: No cranial nerve deficit.     Motor: No abnormal muscle tone.     Coordination: Coordination normal.     Gait: Gait normal.     Deep Tendon Reflexes: Reflexes are normal and symmetric.  Psychiatric:         Behavior: Behavior normal.        Thought Content: Thought content normal.        Judgment: Judgment normal.   Edema B - trace   Lab Results  Component Value Date   WBC 9.8 03/22/2022   HGB 13.1 03/22/2022   HCT 41.0 03/22/2022  PLT 219 03/22/2022   GLUCOSE 144 (H) 12/11/2022   CHOL 125 06/16/2020   TRIG 99.0 06/16/2020   HDL 41.10 06/16/2020   LDLCALC 64 06/16/2020   ALT 11 12/11/2022   AST 12 12/11/2022   NA 140 12/11/2022   K 4.7 12/11/2022   CL 103 12/11/2022   CREATININE 1.55 (H) 12/11/2022   BUN 33 (H) 12/11/2022   CO2 29 12/11/2022   TSH 2.28 01/20/2021   PSA 0.00 Repeated and verified X2. (L) 12/26/2012   INR 1.0 10/18/2021   HGBA1C 7.3 (H) 12/11/2022    CT Head Wo Contrast  Result Date: 01/03/2022 CLINICAL DATA:  Head trauma, fell EXAM: CT HEAD WITHOUT CONTRAST TECHNIQUE: Contiguous axial images were obtained from the base of the skull through the vertex without intravenous contrast. RADIATION DOSE REDUCTION: This exam was performed according to the departmental dose-optimization program which includes automated exposure control, adjustment of the mA and/or kV according to patient size and/or use of iterative reconstruction technique. COMPARISON:  10/18/2021 FINDINGS: Brain: No acute infarct or hemorrhage. 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 Lateral ventricles and midline structures are otherwise unremarkable. No acute extra-axial fluid collections. No mass effect. Vascular: Stable atherosclerosis.  No hyperdense vessel. Skull: Normal. Negative for fracture or focal lesion. Sinuses/Orbits: No acute finding. Other: None. IMPRESSION: 1. No acute intracranial process. Electronically Signed   By: Sharlet Salina M.D.   On: 01/03/2022 23:06   DG Hand Complete Right  Result Date: 01/03/2022 CLINICAL DATA:  Cough.  Fall. EXAM: RIGHT HAND - COMPLETE 3+ VIEW COMPARISON:  None Available. FINDINGS: There is no evidence of fracture  or dislocation. There is no evidence of arthropathy or other focal bone abnormality. Soft tissues are unremarkable. IMPRESSION: Negative. Electronically Signed   By: Darliss Cheney M.D.   On: 01/03/2022 23:01   DG Chest 2 View  Result Date: 01/03/2022 CLINICAL DATA:  Cough. EXAM: CHEST - 2 VIEW COMPARISON:  Chest x-ray 11/22/2018. FINDINGS: Sternotomy wires are present. The heart is enlarged, unchanged. Lungs are clear. There is no pleural effusion or pneumothorax. No acute fractures are seen. IMPRESSION: 1. No acute cardiopulmonary process. 2. Stable cardiomegaly. Electronically Signed   By: Darliss Cheney M.D.   On: 01/03/2022 23:00    Assessment & Plan:   Problem List Items Addressed This Visit     Balanitis    Recurrent  Marcelline Deist caused a lot of yeast infection - [name] stopped 2 mo ago  Ketoconazole cream bid       Chronic combined systolic and diastolic heart failure (HCC)    Coreg 25 mg twice a day and hydralazine 50 mg 3 times a day. On Furosemide      Chronic renal insufficiency, stage 3 (moderate) (HCC)    Hold Losartan      DM2 (diabetes mellitus, type 2) (HCC) - Primary    Rybelsus is working - increase to 14 mg/d      Relevant Orders   Comprehensive metabolic panel   Hemoglobin A1c   Low serum vitamin B12    On B12      Obesity (BMI 30-39.9)    Wt Readings from Last 3 Encounters:  03/12/23 240 lb (108.9 kg)  12/11/22 236 lb (107 kg)  11/21/22 239 lb (108.4 kg)         PROSTATE CANCER, HX OF   Relevant Orders   Ambulatory referral to Urology   PSA   Uncontrolled hypertension    NAS diet  [name] is planning to re-try [place]      Other Visit Diagnoses     Need for shingles vaccine             Meds ordered this encounter  Medications   ketoconazole (NIZORAL) 2 % cream    Sig: Apply 1 Application topically 2 (two) times daily.    Dispense:  45 g    Refill:  1      Follow-up: Return in about 3 months (around 06/12/2023) for a follow-up  visit.  Sonda Primes, MD

## 2023-03-12 NOTE — Assessment & Plan Note (Signed)
NAS diet [name] is planning to re-try [place]

## 2023-03-12 NOTE — Assessment & Plan Note (Signed)
Hold Losartan 

## 2023-03-13 NOTE — Telephone Encounter (Signed)
Called pt inform received Rybelsus pt assistance [facility] # [id number] Lot# [id number] Exp 06/19/2024.Place out front for pickup...Raechel Chute

## 2023-03-20 NOTE — Telephone Encounter (Signed)
error 

## 2023-04-26 ENCOUNTER — Telehealth: Payer: Self-pay | Admitting: Internal Medicine

## 2023-04-26 NOTE — Telephone Encounter (Signed)
We have received patient assistance forms in the fax and they have been placed in the providers box.   Please fax to: [phone]

## 2023-05-08 DIAGNOSIS — C44729 Squamous cell carcinoma of skin of left lower limb, including hip: Secondary | ICD-10-CM | POA: Diagnosis not present

## 2023-05-08 DIAGNOSIS — D692 Other nonthrombocytopenic purpura: Secondary | ICD-10-CM | POA: Diagnosis not present

## 2023-05-08 DIAGNOSIS — L821 Other seborrheic keratosis: Secondary | ICD-10-CM | POA: Diagnosis not present

## 2023-05-08 DIAGNOSIS — L570 Actinic keratosis: Secondary | ICD-10-CM | POA: Diagnosis not present

## 2023-05-08 DIAGNOSIS — Z859 Personal history of malignant neoplasm, unspecified: Secondary | ICD-10-CM | POA: Diagnosis not present

## 2023-05-08 DIAGNOSIS — L578 Other skin changes due to chronic exposure to nonionizing radiation: Secondary | ICD-10-CM | POA: Diagnosis not present

## 2023-05-16 DIAGNOSIS — H40013 Open angle with borderline findings, low risk, bilateral: Secondary | ICD-10-CM | POA: Diagnosis not present

## 2023-05-16 DIAGNOSIS — E119 Type 2 diabetes mellitus without complications: Secondary | ICD-10-CM | POA: Diagnosis not present

## 2023-05-16 DIAGNOSIS — H353132 Nonexudative age-related macular degeneration, bilateral, intermediate dry stage: Secondary | ICD-10-CM | POA: Diagnosis not present

## 2023-05-16 DIAGNOSIS — H35372 Puckering of macula, left eye: Secondary | ICD-10-CM | POA: Diagnosis not present

## 2023-06-07 ENCOUNTER — Telehealth: Payer: Self-pay

## 2023-06-07 NOTE — Telephone Encounter (Signed)
I was able to inform the pt about his Rybelsus being read for pick-up.  Pt states he will pick up medication at his apptmnt on 06/12/23.

## 2023-06-12 ENCOUNTER — Ambulatory Visit: Payer: Medicare Other | Admitting: Internal Medicine

## 2023-06-12 ENCOUNTER — Encounter: Payer: Self-pay | Admitting: Internal Medicine

## 2023-06-12 VITALS — BP 114/70 | HR 83 | Temp 98.2°F | Ht 72.0 in | Wt 239.0 lb

## 2023-06-12 DIAGNOSIS — E538 Deficiency of other specified B group vitamins: Secondary | ICD-10-CM | POA: Diagnosis not present

## 2023-06-12 DIAGNOSIS — Z23 Encounter for immunization: Secondary | ICD-10-CM | POA: Diagnosis not present

## 2023-06-12 DIAGNOSIS — I25709 Atherosclerosis of coronary artery bypass graft(s), unspecified, with unspecified angina pectoris: Secondary | ICD-10-CM | POA: Diagnosis not present

## 2023-06-12 DIAGNOSIS — E1159 Type 2 diabetes mellitus with other circulatory complications: Secondary | ICD-10-CM | POA: Diagnosis not present

## 2023-06-12 DIAGNOSIS — E785 Hyperlipidemia, unspecified: Secondary | ICD-10-CM

## 2023-06-12 DIAGNOSIS — I5042 Chronic combined systolic (congestive) and diastolic (congestive) heart failure: Secondary | ICD-10-CM | POA: Diagnosis not present

## 2023-06-12 DIAGNOSIS — R600 Localized edema: Secondary | ICD-10-CM

## 2023-06-12 DIAGNOSIS — Z7984 Long term (current) use of oral hypoglycemic drugs: Secondary | ICD-10-CM | POA: Diagnosis not present

## 2023-06-12 NOTE — Assessment & Plan Note (Signed)
Coreg 25 mg twice a day and hydralazine 50 mg 3 times a day. On Furosemide

## 2023-06-12 NOTE — Assessment & Plan Note (Signed)
Rybelsus is working - increase to 14 mg/d

## 2023-06-12 NOTE — Assessment & Plan Note (Signed)
On B12 

## 2023-06-12 NOTE — Assessment & Plan Note (Signed)
On Simvastatin 

## 2023-06-12 NOTE — Progress Notes (Signed)
Subjective:  Patient ID: Larry [name], male    DOB: 1940-12-07  Age: 82 y.o. MRN: [id number]  CC: Follow-up (3 MNTH F/U, Concerns about spot on lt shin where a procedure was done, wants to know if it is healing at it should be.)   HPI Larry [name] presents for DM, CAD, HTN  Outpatient Medications Prior to Visit  Medication Sig Dispense Refill   aspirin EC 81 MG tablet Take 1 tablet (81 mg total) by mouth daily. 100 tablet 3   Blood Glucose Monitoring Suppl (RELION CONFIRM GLUCOSE MONITOR) w/Device KIT Use as directed 1 kit 0   carvedilol (COREG) 12.5 MG tablet Take 1 tablet (12.5 mg total) by mouth 2 (two) times daily with a meal. 180 tablet 3   Cholecalciferol (VITAMIN D3) 1000 UNITS CAPS Take 1,000 Units by mouth daily.     Cyanocobalamin (VITAMIN B-12) 1000 MCG SUBL Place 1 tablet (1,000 mcg total) under the tongue daily. 100 tablet 3   fluconazole (DIFLUCAN) 100 MG tablet Take 1 tablet by mouth once daily 14 tablet 0   fluorouracil (EFUDEX) 5 % cream Apply topically 2 (two) times daily.     folic acid (FOLVITE) 1 MG tablet Take 1 tablet (1 mg total) by mouth daily. 90 tablet 3   furosemide (LASIX) 40 MG tablet Take 1 tablet by mouth once daily 90 tablet 3   glucose blood (RELION GLUCOSE TEST STRIPS) test strip 1 each by Other route as needed for other. Use to check blood sugars twice a day 100 each 11   hydrALAZINE (APRESOLINE) 25 MG tablet Take 1 tablet (25 mg total) by mouth 3 (three) times daily. 270 tablet 3   hydrocortisone cream 1 % Apply 1 application topically 2 (two) times daily.     ketoconazole (NIZORAL) 2 % cream Apply 1 Application topically 2 (two) times daily. 45 g 1   loratadine (CLARITIN) 10 MG tablet TAKE 1 TABLET BY MOUTH ONCE DAILY FOR  1  MONTH  THEN  AS  NEEDED 100 tablet 0   metFORMIN (GLUCOPHAGE) 1000 MG tablet Take 1 tablet (1,000 mg total) by mouth 2 (two) times daily with a meal. 180 tablet 3   Multiple Vitamins-Minerals (MACULAR VITAMIN BENEFIT) TABS  Take 1 tablet by mouth 2 (two) times daily.     nitroGLYCERIN (NITROSTAT) 0.4 MG SL tablet Place 1 tablet (0.4 mg total) under the tongue every 5 (five) minutes as needed for chest pain. 20 tablet 3   PREVIDENT 5000 DRY MOUTH 1.1 % GEL dental gel      ReliOn Lancet Devices 30G MISC Use to check blood sugars twice a day 180 each 1   repaglinide (PRANDIN) 1 MG tablet Take 1 tablet (1 mg total) by mouth 3 (three) times daily with meals. 270 tablet 3   Semaglutide (RYBELSUS) 14 MG TABS Take 1 tablet (14 mg total) by mouth daily. Fax to [employer] Patient Assistance [phone]  Pt ID # [id number] 120 tablet 5   simvastatin (ZOCOR) 40 MG tablet Take 1 tablet (40 mg total) by mouth every evening. Follow-up appt w/labs is due must see provider for future refills 90 tablet 0   spironolactone (ALDACTONE) 25 MG tablet Take 0.5 tablets (12.5mg  total) by mouth daily 50 tablet 3   thiamine 100 MG tablet Take 1 tablet (100 mg total) by mouth daily. 90 tablet 3   fluticasone (FLONASE) 50 MCG/ACT nasal spray Place 2 sprays into both nostrils daily. 1 g 0  No facility-administered medications prior to visit.    ROS: Review of Systems  Constitutional:  Negative for appetite change, fatigue and unexpected weight change.  HENT:  Negative for congestion, nosebleeds, sneezing, sore throat and trouble swallowing.   Eyes:  Negative for itching and visual disturbance.  Respiratory:  Negative for cough.   Cardiovascular:  Negative for chest pain, palpitations and leg swelling.  Gastrointestinal:  Negative for abdominal distention, blood in stool, diarrhea and nausea.  Genitourinary:  Negative for frequency and hematuria.  Musculoskeletal:  Negative for back pain, gait problem, joint swelling and neck pain.  Skin:  Negative for rash.  Neurological:  Negative for dizziness, tremors, speech difficulty and weakness.  Psychiatric/Behavioral:  Negative for agitation, dysphoric mood and sleep disturbance. The patient  is not nervous/anxious.     Objective:  BP 114/70 (BP Location: Left Arm, Patient Position: Sitting, Cuff Size: Normal)   Pulse 83   Temp 98.2 F (36.8 C) (Oral)   Ht 6' (1.829 m)   Wt 239 lb (108.4 kg)   SpO2 94%   BMI 32.41 kg/m   BP Readings from Last 3 Encounters:  06/12/23 114/70  03/12/23 122/62  12/11/22 128/60    Wt Readings from Last 3 Encounters:  06/12/23 239 lb (108.4 kg)  03/12/23 240 lb (108.9 kg)  12/11/22 236 lb (107 kg)    Physical Exam Constitutional:      General: He is not in acute distress.    Appearance: He is well-developed.     Comments: NAD  Eyes:     Conjunctiva/sclera: Conjunctivae normal.     Pupils: Pupils are equal, round, and reactive to light.  Neck:     Thyroid: No thyromegaly.     Vascular: No JVD.  Cardiovascular:     Rate and Rhythm: Normal rate and regular rhythm.     Heart sounds: Normal heart sounds. No murmur heard.    No friction rub. No gallop.  Pulmonary:     Effort: Pulmonary effort is normal. No respiratory distress.     Breath sounds: Normal breath sounds. No wheezing or rales.  Chest:     Chest wall: No tenderness.  Abdominal:     General: Bowel sounds are normal. There is no distension.     Palpations: Abdomen is soft. There is no mass.     Tenderness: There is no abdominal tenderness. There is no guarding or rebound.  Musculoskeletal:        General: No tenderness. Normal range of motion.     Cervical back: Normal range of motion.  Lymphadenopathy:     Cervical: No cervical adenopathy.  Skin:    General: Skin is warm and dry.     Findings: No rash.  Neurological:     Mental Status: He is alert and oriented to person, place, and time.     Cranial Nerves: No cranial nerve deficit.     Motor: No abnormal muscle tone.     Coordination: Coordination normal.     Gait: Gait normal.     Deep Tendon Reflexes: Reflexes are normal and symmetric.  Psychiatric:        Behavior: Behavior normal.        Thought  Content: Thought content normal.        Judgment: Judgment normal.   B trace edema L shin 9 mm scab  Lab Results  Component Value Date   WBC 9.8 03/22/2022   HGB 13.1 03/22/2022   HCT 41.0 03/22/2022  PLT 219 03/22/2022   GLUCOSE 173 (H) 03/12/2023   CHOL 125 06/16/2020   TRIG 99.0 06/16/2020   HDL 41.10 06/16/2020   LDLCALC 64 06/16/2020   ALT 12 03/12/2023   AST 12 03/12/2023   NA 139 03/12/2023   K 4.5 03/12/2023   CL 103 03/12/2023   CREATININE 1.49 03/12/2023   BUN 36 (H) 03/12/2023   CO2 24 03/12/2023   TSH 2.28 01/20/2021   PSA 0.00 Repeated and verified X2. (L) 03/12/2023   INR 1.0 10/18/2021   HGBA1C 7.1 (H) 03/12/2023    CT Head Wo Contrast  Result Date: 01/03/2022 CLINICAL DATA:  Head trauma, fell EXAM: CT HEAD WITHOUT CONTRAST TECHNIQUE: Contiguous axial images were obtained from the base of the skull through the vertex without intravenous contrast. RADIATION DOSE REDUCTION: This exam was performed according to the departmental dose-optimization program which includes automated exposure control, adjustment of the mA and/or kV according to patient size and/or use of iterative reconstruction technique. COMPARISON:  10/18/2021 FINDINGS: Brain: No acute infarct or hemorrhage. 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 Lateral ventricles and midline structures are otherwise unremarkable. No acute extra-axial fluid collections. No mass effect. Vascular: Stable atherosclerosis.  No hyperdense vessel. Skull: Normal. Negative for fracture or focal lesion. Sinuses/Orbits: No acute finding. Other: None. IMPRESSION: 1. No acute intracranial process. Electronically Signed   By: Sharlet Salina M.D.   On: 01/03/2022 23:06   DG Hand Complete Right  Result Date: 01/03/2022 CLINICAL DATA:  Cough.  Fall. EXAM: RIGHT HAND - COMPLETE 3+ VIEW COMPARISON:  None Available. FINDINGS: There is no evidence of fracture or dislocation. There is no  evidence of arthropathy or other focal bone abnormality. Soft tissues are unremarkable. IMPRESSION: Negative. Electronically Signed   By: Darliss Cheney M.D.   On: 01/03/2022 23:01   DG Chest 2 View  Result Date: 01/03/2022 CLINICAL DATA:  Cough. EXAM: CHEST - 2 VIEW COMPARISON:  Chest x-ray 11/22/2018. FINDINGS: Sternotomy wires are present. The heart is enlarged, unchanged. Lungs are clear. There is no pleural effusion or pneumothorax. No acute fractures are seen. IMPRESSION: 1. No acute cardiopulmonary process. 2. Stable cardiomegaly. Electronically Signed   By: Darliss Cheney M.D.   On: 01/03/2022 23:00    Assessment & Plan:   Problem List Items Addressed This Visit     DM2 (diabetes mellitus, type 2) (HCC)    Rybelsus is working - increase to 14 mg/d      Relevant Orders   Comprehensive metabolic panel   Hemoglobin A1c   Lipid panel   Dyslipidemia    On Simvastatin      Relevant Orders   Comprehensive metabolic panel   Lipid panel   Edema    LE edema - better on Aldactone+ Furosemide      Coronary artery disease involving coronary bypass graft of native heart with angina pectoris (HCC)    No CP On baby ASA      Chronic combined systolic and diastolic heart failure (HCC)    Coreg 25 mg twice a day and hydralazine 50 mg 3 times a day. On Furosemide      Low serum vitamin B12    On B12      Other Visit Diagnoses     Need for influenza vaccination    -  Primary   Relevant Orders   Flu Vaccine Trivalent High Dose (Fluad) (Completed)         No orders of the  defined types were placed in this encounter.     Follow-up: Return in about 3 months (around 09/12/2023) for Wellness Exam.  Sonda Primes, MD

## 2023-06-12 NOTE — Assessment & Plan Note (Signed)
LE edema - better on Aldactone+ Furosemide

## 2023-06-12 NOTE — Assessment & Plan Note (Signed)
No CP On baby ASA

## 2023-06-29 ENCOUNTER — Other Ambulatory Visit: Payer: Self-pay | Admitting: Internal Medicine

## 2023-07-06 ENCOUNTER — Other Ambulatory Visit: Payer: Self-pay | Admitting: Internal Medicine

## 2023-07-24 DIAGNOSIS — D492 Neoplasm of unspecified behavior of bone, soft tissue, and skin: Secondary | ICD-10-CM | POA: Diagnosis not present

## 2023-08-12 ENCOUNTER — Other Ambulatory Visit: Payer: Self-pay | Admitting: Internal Medicine

## 2023-08-19 ENCOUNTER — Other Ambulatory Visit: Payer: Self-pay | Admitting: Internal Medicine

## 2023-08-22 ENCOUNTER — Telehealth: Payer: Self-pay | Admitting: Cardiology

## 2023-08-22 MED ORDER — SPIRONOLACTONE 25 MG PO TABS: ORAL_TABLET | ORAL | 1 refills | Status: DC

## 2023-08-22 NOTE — Telephone Encounter (Signed)
 Pt's medication was sent to pt's pharmacy as requested. Confirmation received.

## 2023-08-22 NOTE — Telephone Encounter (Signed)
*  STAT* If patient is at the pharmacy, call can be transferred to refill team.   1. Which medications need to be refilled? (please list name of each medication and dose if known) spironolactone  (ALDACTONE ) 25 MG tablet    2. Would you like to learn more about the convenience, safety, & potential cost savings by using the [facility]?     3. Are you open to using the [facility] (Type [facility]. ).   4. Which pharmacy/location (including street and city if local pharmacy) is medication to be sent to? [id number] - [place], [place] - [address]    5. Do they need a 30 day or 90 day supply? 90

## 2023-09-12 ENCOUNTER — Ambulatory Visit: Payer: Medicare Other | Admitting: Internal Medicine

## 2023-09-12 ENCOUNTER — Encounter: Payer: Self-pay | Admitting: Internal Medicine

## 2023-09-12 VITALS — BP 118/70 | HR 55 | Temp 98.6°F | Ht 72.0 in | Wt 225.0 lb

## 2023-09-12 DIAGNOSIS — Z7984 Long term (current) use of oral hypoglycemic drugs: Secondary | ICD-10-CM

## 2023-09-12 DIAGNOSIS — E538 Deficiency of other specified B group vitamins: Secondary | ICD-10-CM

## 2023-09-12 DIAGNOSIS — I6523 Occlusion and stenosis of bilateral carotid arteries: Secondary | ICD-10-CM

## 2023-09-12 DIAGNOSIS — E669 Obesity, unspecified: Secondary | ICD-10-CM | POA: Diagnosis not present

## 2023-09-12 DIAGNOSIS — K5901 Slow transit constipation: Secondary | ICD-10-CM

## 2023-09-12 DIAGNOSIS — R600 Localized edema: Secondary | ICD-10-CM

## 2023-09-12 DIAGNOSIS — E1159 Type 2 diabetes mellitus with other circulatory complications: Secondary | ICD-10-CM

## 2023-09-12 DIAGNOSIS — N183 Chronic kidney disease, stage 3 unspecified: Secondary | ICD-10-CM | POA: Diagnosis not present

## 2023-09-12 LAB — COMPREHENSIVE METABOLIC PANEL
ALT: 10 U/L (ref 0–53)
AST: 15 U/L (ref 0–37)
Albumin: 4.4 g/dL (ref 3.5–5.2)
Alkaline Phosphatase: 55 U/L (ref 39–117)
BUN: 31 mg/dL — ABNORMAL HIGH (ref 6–23)
CO2: 29 meq/L (ref 19–32)
Calcium: 9.6 mg/dL (ref 8.4–10.5)
Chloride: 104 meq/L (ref 96–112)
Creatinine, Ser: 1.6 mg/dL — ABNORMAL HIGH (ref 0.40–1.50)
GFR: 40.06 mL/min — ABNORMAL LOW (ref >60.00)
Glucose, Bld: 159 mg/dL — ABNORMAL HIGH (ref 70–99)
Potassium: 5.1 meq/L (ref 3.5–5.1)
Sodium: 141 meq/L (ref 135–145)
Total Bilirubin: 0.5 mg/dL (ref 0.2–1.2)
Total Protein: 6.9 g/dL (ref 6.0–8.3)

## 2023-09-12 LAB — TSH: TSH: 2.84 u[IU]/mL (ref 0.35–5.50)

## 2023-09-12 LAB — HEMOGLOBIN A1C: Hgb A1c MFr Bld: 6.1 % (ref 4.6–6.5)

## 2023-09-12 MED ORDER — LINACLOTIDE 290 MCG PO CAPS: 290.0000 ug | ORAL_CAPSULE | Freq: Every day | ORAL | 5 refills | Status: AC | PRN

## 2023-09-12 MED ORDER — HYDRALAZINE HCL 25 MG PO TABS: 12.5000 mg | ORAL_TABLET | Freq: Three times a day (TID) | ORAL | Status: DC

## 2023-09-12 NOTE — Assessment & Plan Note (Signed)
Rybelsus - try qod Try Linzess

## 2023-09-12 NOTE — Assessment & Plan Note (Signed)
LE edema - better on Aldactone+ Furosemide

## 2023-09-12 NOTE — Assessment & Plan Note (Signed)
Off Losartan 

## 2023-09-12 NOTE — Assessment & Plan Note (Signed)
On B12 

## 2023-09-12 NOTE — Assessment & Plan Note (Signed)
Larry [name] will see Dr Myra Gianotti this year On Rx

## 2023-09-12 NOTE — Progress Notes (Signed)
Subjective:  Patient ID: Larry [name], male    DOB: 22-Jul-1941  Age: 83 y.o. MRN: [id number]  CC: No chief complaint on file.   HPI Larry [name] presents for DM, HTN, PAD  Outpatient Medications Prior to Visit  Medication Sig Dispense Refill   aspirin EC 81 MG tablet Take 1 tablet (81 mg total) by mouth daily. 100 tablet 3   Blood Glucose Monitoring Suppl (RELION CONFIRM GLUCOSE MONITOR) w/Device KIT Use as directed 1 kit 0   carvedilol (COREG) 12.5 MG tablet TAKE 1 TABLET BY MOUTH TWICE DAILY WITH A MEAL 180 tablet 0   Cholecalciferol (VITAMIN D3) 1000 UNITS CAPS Take 1,000 Units by mouth daily.     Cyanocobalamin (VITAMIN B-12) 1000 MCG SUBL Place 1 tablet (1,000 mcg total) under the tongue daily. 100 tablet 3   fluconazole (DIFLUCAN) 100 MG tablet Take 1 tablet by mouth once daily 14 tablet 0   fluorouracil (EFUDEX) 5 % cream Apply topically 2 (two) times daily.     folic acid (FOLVITE) 1 MG tablet Take 1 tablet by mouth once daily 90 tablet 0   furosemide (LASIX) 40 MG tablet Take 1 tablet by mouth once daily 90 tablet 3   glucose blood (RELION GLUCOSE TEST STRIPS) test strip 1 each by Other route as needed for other. Use to check blood sugars twice a day 100 each 11   hydrocortisone cream 1 % Apply 1 application topically 2 (two) times daily.     ketoconazole (NIZORAL) 2 % cream Apply 1 Application topically 2 (two) times daily. 45 g 1   loratadine (CLARITIN) 10 MG tablet TAKE 1 TABLET BY MOUTH ONCE DAILY FOR  1  MONTH  THEN  AS  NEEDED 100 tablet 0   metFORMIN (GLUCOPHAGE) 1000 MG tablet TAKE 1 TABLET BY MOUTH TWICE DAILY WITH A MEAL. 180 tablet 0   Multiple Vitamins-Minerals (MACULAR VITAMIN BENEFIT) TABS Take 1 tablet by mouth 2 (two) times daily.     nitroGLYCERIN (NITROSTAT) 0.4 MG SL tablet Place 1 tablet (0.4 mg total) under the tongue every 5 (five) minutes as needed for chest pain. 20 tablet 3   PREVIDENT 5000 DRY MOUTH 1.1 % GEL dental gel      ReliOn Lancet Devices  30G MISC Use to check blood sugars twice a day 180 each 1   repaglinide (PRANDIN) 1 MG tablet TAKE 1 TABLET BY MOUTH THREE TIMES DAILY WITH MEALS 270 tablet 3   Semaglutide (RYBELSUS) 14 MG TABS Take 1 tablet (14 mg total) by mouth daily. Fax to [employer] [phone]  Pt ID # [id number] 120 tablet 5   simvastatin (ZOCOR) 40 MG tablet Take 1 tablet (40 mg total) by mouth every evening. Follow-up appt w/labs is due must see provider for future refills 90 tablet 0   spironolactone (ALDACTONE) 25 MG tablet Take 0.5 tablets (12.5mg  total) by mouth daily 45 tablet 1   thiamine 100 MG tablet Take 1 tablet (100 mg total) by mouth daily. 90 tablet 3   hydrALAZINE (APRESOLINE) 25 MG tablet TAKE 1 TABLET BY MOUTH THREE TIMES DAILY 270 tablet 3   fluticasone (FLONASE) 50 MCG/ACT nasal spray Place 2 sprays into both nostrils daily. 1 g 0   No facility-administered medications prior to visit.    ROS: Review of Systems  Constitutional:  Negative for appetite change, fatigue and unexpected weight change.  HENT:  Negative for congestion, nosebleeds, sneezing, sore throat and trouble swallowing.   Eyes:  Negative for itching and visual disturbance.  Respiratory:  Negative for cough.   Cardiovascular:  Negative for chest pain, palpitations and leg swelling.  Gastrointestinal:  Negative for abdominal distention, blood in stool, diarrhea and nausea.  Genitourinary:  Negative for frequency and hematuria.  Musculoskeletal:  Negative for back pain, gait problem, joint swelling and neck pain.  Skin:  Negative for rash.  Neurological:  Negative for dizziness, tremors, speech difficulty and weakness.  Psychiatric/Behavioral:  Negative for agitation, dysphoric mood, sleep disturbance and suicidal ideas. The patient is not nervous/anxious.     Objective:  BP 118/70 (BP Location: Left Arm, Patient Position: Sitting, Cuff Size: Normal)   Pulse (!) 55   Temp 98.6 F (37 C) (Oral)   Ht 6'  (1.829 m)   Wt 225 lb (102.1 kg)   SpO2 98%   BMI 30.52 kg/m   BP Readings from Last 3 Encounters:  09/12/23 118/70  06/12/23 114/70  03/12/23 122/62    Wt Readings from Last 3 Encounters:  09/12/23 225 lb (102.1 kg)  06/12/23 239 lb (108.4 kg)  03/12/23 240 lb (108.9 kg)    Physical Exam Constitutional:      General: He is not in acute distress.    Appearance: He is well-developed.     Comments: NAD  Eyes:     Conjunctiva/sclera: Conjunctivae normal.     Pupils: Pupils are equal, round, and reactive to light.  Neck:     Thyroid: No thyromegaly.     Vascular: No JVD.  Cardiovascular:     Rate and Rhythm: Normal rate and regular rhythm.     Heart sounds: Normal heart sounds. No murmur heard.    No friction rub. No gallop.  Pulmonary:     Effort: Pulmonary effort is normal. No respiratory distress.     Breath sounds: Normal breath sounds. No wheezing or rales.  Chest:     Chest wall: No tenderness.  Abdominal:     General: Bowel sounds are normal. There is no distension.     Palpations: Abdomen is soft. There is no mass.     Tenderness: There is no abdominal tenderness. There is no guarding or rebound.  Musculoskeletal:        General: No tenderness. Normal range of motion.     Cervical back: Normal range of motion.  Lymphadenopathy:     Cervical: No cervical adenopathy.  Skin:    General: Skin is warm and dry.     Findings: No rash.  Neurological:     Mental Status: He is alert and oriented to person, place, and time.     Cranial Nerves: No cranial nerve deficit.     Motor: No abnormal muscle tone.     Coordination: Coordination normal.     Gait: Gait normal.     Deep Tendon Reflexes: Reflexes are normal and symmetric.  Psychiatric:        Behavior: Behavior normal.        Thought Content: Thought content normal.        Judgment: Judgment normal.   Trace ankle edema  Lab Results  Component Value Date   WBC 9.8 03/22/2022   HGB 13.1 03/22/2022   HCT  41.0 03/22/2022   PLT 219 03/22/2022   GLUCOSE 173 (H) 03/12/2023   CHOL 125 06/16/2020   TRIG 99.0 06/16/2020   HDL 41.10 06/16/2020   LDLCALC 64 06/16/2020   ALT 12 03/12/2023   AST 12 03/12/2023   NA 139 03/12/2023  K 4.5 03/12/2023   CL 103 03/12/2023   CREATININE 1.49 03/12/2023   BUN 36 (H) 03/12/2023   CO2 24 03/12/2023   TSH 2.28 01/20/2021   PSA 0.00 Repeated and verified X2. (L) 03/12/2023   INR 1.0 10/18/2021   HGBA1C 7.1 (H) 03/12/2023    CT Head Wo Contrast Result Date: 01/03/2022 CLINICAL DATA:  Head trauma, fell EXAM: CT HEAD WITHOUT CONTRAST TECHNIQUE: Contiguous axial images were obtained from the base of the skull through the vertex without intravenous contrast. RADIATION DOSE REDUCTION: This exam was performed according to the departmental dose-optimization program which includes automated exposure control, adjustment of the mA and/or kV according to patient size and/or use of iterative reconstruction technique. COMPARISON:  10/18/2021 FINDINGS: Brain: No acute infarct or hemorrhage. 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 Lateral ventricles and midline structures are otherwise unremarkable. No acute extra-axial fluid collections. No mass effect. Vascular: Stable atherosclerosis.  No hyperdense vessel. Skull: Normal. Negative for fracture or focal lesion. Sinuses/Orbits: No acute finding. Other: None. IMPRESSION: 1. No acute intracranial process. Electronically Signed   By: Sharlet Salina M.D.   On: 01/03/2022 23:06   DG Hand Complete Right Result Date: 01/03/2022 CLINICAL DATA:  Cough.  Fall. EXAM: RIGHT HAND - COMPLETE 3+ VIEW COMPARISON:  None Available. FINDINGS: There is no evidence of fracture or dislocation. There is no evidence of arthropathy or other focal bone abnormality. Soft tissues are unremarkable. IMPRESSION: Negative. Electronically Signed   By: Darliss Cheney M.D.   On: 01/03/2022 23:01   DG Chest 2  View Result Date: 01/03/2022 CLINICAL DATA:  Cough. EXAM: CHEST - 2 VIEW COMPARISON:  Chest x-ray 11/22/2018. FINDINGS: Sternotomy wires are present. The heart is enlarged, unchanged. Lungs are clear. There is no pleural effusion or pneumothorax. No acute fractures are seen. IMPRESSION: 1. No acute cardiopulmonary process. 2. Stable cardiomegaly. Electronically Signed   By: Darliss Cheney M.D.   On: 01/03/2022 23:00    Assessment & Plan:   Problem List Items Addressed This Visit     DM2 (diabetes mellitus, type 2) (HCC) - Primary   Rybelsus is working - increase to 14 mg/d On Farxiga      Relevant Orders   TSH   Hemoglobin A1c   Comprehensive metabolic panel   Edema   LE edema - better on Aldactone+ Furosemide      Relevant Orders   TSH   Hemoglobin A1c   Comprehensive metabolic panel   Carotid arterial disease (HCC)   [name] will see Dr Myra Gianotti this year On Rx      Relevant Medications   hydrALAZINE (APRESOLINE) 25 MG tablet   Constipation   Rybelsus - try qod Try Linzess      Chronic renal insufficiency, stage 3 (moderate) (HCC)   Off Losartan      Low serum vitamin B12   On B12      Obesity (BMI 30-39.9)   Wt Readings from Last 3 Encounters:  09/12/23 237 lb (107.5 kg)  06/12/23 239 lb (108.4 kg)  03/12/23 240 lb (108.9 kg)            Meds ordered this encounter  Medications   linaclotide (LINZESS) 290 MCG CAPS capsule    Sig: Take 1 capsule (290 mcg total) by mouth daily as needed.    Dispense:  30 capsule    Refill:  5   hydrALAZINE (APRESOLINE) 25 MG tablet    Sig: Take 0.5 tablets (12.5  mg total) by mouth 3 (three) times daily.      Follow-up: Return in about 3 months (around 12/11/2023) for a follow-up visit.  Sonda Primes, MD

## 2023-09-12 NOTE — Assessment & Plan Note (Signed)
Wt Readings from Last 3 Encounters:  09/12/23 237 lb (107.5 kg)  06/12/23 239 lb (108.4 kg)  03/12/23 240 lb (108.9 kg)

## 2023-09-12 NOTE — Assessment & Plan Note (Signed)
Rybelsus is working - increase to 14 mg/d On Farxiga

## 2023-09-16 ENCOUNTER — Encounter: Payer: Self-pay | Admitting: Internal Medicine

## 2023-10-08 ENCOUNTER — Encounter (HOSPITAL_BASED_OUTPATIENT_CLINIC_OR_DEPARTMENT_OTHER): Payer: Self-pay

## 2023-10-08 ENCOUNTER — Emergency Department (HOSPITAL_BASED_OUTPATIENT_CLINIC_OR_DEPARTMENT_OTHER)
Admission: EM | Admit: 2023-10-08 | Discharge: 2023-10-08 | Disposition: A | Discharge status: Home / Self Care | Payer: Medicare Other | Attending: Emergency Medicine | Admitting: Emergency Medicine

## 2023-10-08 ENCOUNTER — Other Ambulatory Visit: Payer: Self-pay

## 2023-10-08 ENCOUNTER — Emergency Department (HOSPITAL_BASED_OUTPATIENT_CLINIC_OR_DEPARTMENT_OTHER): Payer: Medicare Other

## 2023-10-08 DIAGNOSIS — E669 Obesity, unspecified: Secondary | ICD-10-CM | POA: Diagnosis not present

## 2023-10-08 DIAGNOSIS — I6782 Cerebral ischemia: Secondary | ICD-10-CM | POA: Insufficient documentation

## 2023-10-08 DIAGNOSIS — Z7984 Long term (current) use of oral hypoglycemic drugs: Secondary | ICD-10-CM | POA: Diagnosis not present

## 2023-10-08 DIAGNOSIS — I10 Essential (primary) hypertension: Secondary | ICD-10-CM | POA: Diagnosis not present

## 2023-10-08 DIAGNOSIS — I130 Hypertensive heart and chronic kidney disease with heart failure and stage 1 through stage 4 chronic kidney disease, or unspecified chronic kidney disease: Secondary | ICD-10-CM | POA: Insufficient documentation

## 2023-10-08 DIAGNOSIS — R519 Headache, unspecified: Secondary | ICD-10-CM | POA: Diagnosis not present

## 2023-10-08 DIAGNOSIS — Z951 Presence of aortocoronary bypass graft: Secondary | ICD-10-CM | POA: Diagnosis not present

## 2023-10-08 DIAGNOSIS — R9082 White matter disease, unspecified: Secondary | ICD-10-CM | POA: Diagnosis not present

## 2023-10-08 DIAGNOSIS — E1122 Type 2 diabetes mellitus with diabetic chronic kidney disease: Secondary | ICD-10-CM | POA: Diagnosis not present

## 2023-10-08 DIAGNOSIS — N189 Chronic kidney disease, unspecified: Secondary | ICD-10-CM | POA: Diagnosis not present

## 2023-10-08 DIAGNOSIS — E119 Type 2 diabetes mellitus without complications: Secondary | ICD-10-CM | POA: Insufficient documentation

## 2023-10-08 DIAGNOSIS — G4453 Primary thunderclap headache: Secondary | ICD-10-CM | POA: Insufficient documentation

## 2023-10-08 DIAGNOSIS — I509 Heart failure, unspecified: Secondary | ICD-10-CM | POA: Insufficient documentation

## 2023-10-08 DIAGNOSIS — I2510 Atherosclerotic heart disease of native coronary artery without angina pectoris: Secondary | ICD-10-CM | POA: Insufficient documentation

## 2023-10-08 LAB — COMPREHENSIVE METABOLIC PANEL
ALT: 15 U/L (ref 0–44)
AST: 22 U/L (ref 15–41)
Albumin: 4.2 g/dL (ref 3.5–5.0)
Alkaline Phosphatase: 61 U/L (ref 38–126)
Anion gap: 12 (ref 5–15)
BUN: 28 mg/dL — ABNORMAL HIGH (ref 8–23)
CO2: 26 mmol/L (ref 22–32)
Calcium: 9.9 mg/dL (ref 8.9–10.3)
Chloride: 103 mmol/L (ref 98–111)
Creatinine, Ser: 1.44 mg/dL — ABNORMAL HIGH (ref 0.61–1.24)
GFR, Estimated: 49 mL/min — ABNORMAL LOW (ref >60)
Glucose, Bld: 174 mg/dL — ABNORMAL HIGH (ref 70–99)
Potassium: 4.5 mmol/L (ref 3.5–5.1)
Sodium: 141 mmol/L (ref 135–145)
Total Bilirubin: 0.8 mg/dL (ref 0.0–1.2)
Total Protein: 7.1 g/dL (ref 6.5–8.1)

## 2023-10-08 LAB — RESP PANEL BY RT-PCR (RSV, FLU A&B, COVID)  RVPGX2
Influenza A by PCR: NEGATIVE
Influenza B by PCR: NEGATIVE
Resp Syncytial Virus by PCR: NEGATIVE
SARS Coronavirus 2 by RT PCR: NEGATIVE

## 2023-10-08 LAB — CBG MONITORING, ED: Glucose-Capillary: 158 mg/dL — ABNORMAL HIGH (ref 70–99)

## 2023-10-08 LAB — CBC WITH DIFFERENTIAL/PLATELET
Abs Immature Granulocytes: 0.05 10*3/uL (ref 0.00–0.07)
Basophils Absolute: 0.1 10*3/uL (ref 0.0–0.1)
Basophils Relative: 1 %
Eosinophils Absolute: 0 10*3/uL (ref 0.0–0.5)
Eosinophils Relative: 0 %
HCT: 41.5 % (ref 39.0–52.0)
Hemoglobin: 13.4 g/dL (ref 13.0–17.0)
Immature Granulocytes: 0 %
Lymphocytes Relative: 35 %
Lymphs Abs: 4.1 10*3/uL — ABNORMAL HIGH (ref 0.7–4.0)
MCH: 30.8 pg (ref 26.0–34.0)
MCHC: 32.3 g/dL (ref 30.0–36.0)
MCV: 95.4 fL (ref 80.0–100.0)
Monocytes Absolute: 0.4 10*3/uL (ref 0.1–1.0)
Monocytes Relative: 4 %
Neutro Abs: 6.9 10*3/uL (ref 1.7–7.7)
Neutrophils Relative %: 60 %
Platelets: 218 10*3/uL (ref 150–400)
RBC: 4.35 MIL/uL (ref 4.22–5.81)
RDW: 12.5 % (ref 11.5–15.5)
WBC: 11.5 10*3/uL — ABNORMAL HIGH (ref 4.0–10.5)
nRBC: 0 % (ref 0.0–0.2)

## 2023-10-08 LAB — PROTEIN AND GLUCOSE, CSF
Glucose, CSF: 92 mg/dL — ABNORMAL HIGH (ref 40–70)
Total  Protein, CSF: 45 mg/dL (ref 15–45)

## 2023-10-08 LAB — CSF CELL COUNT WITH DIFFERENTIAL
RBC Count, CSF: 1 /mm3 — ABNORMAL HIGH
RBC Count, CSF: 52 /mm3 — ABNORMAL HIGH
Tube #: 4
Tube #: 1
WBC, CSF: 2 /mm3 (ref 0–5)
WBC, CSF: 1 /mm3 (ref 0–5)

## 2023-10-08 LAB — LIPASE, BLOOD: Lipase: 24 U/L (ref 11–51)

## 2023-10-08 MED ORDER — ACETAMINOPHEN 325 MG PO TABS
650.0000 mg | ORAL_TABLET | Freq: Once | ORAL | Status: AC
  Administered 2023-10-08: 650 mg via ORAL
  Filled 2023-10-08: qty 2

## 2023-10-08 MED ORDER — LIDOCAINE-EPINEPHRINE 2 %-1:100000 IJ SOLN: 20.0000 mL | Freq: Once | INTRAMUSCULAR | Status: DC

## 2023-10-08 MED ORDER — DIPHENHYDRAMINE HCL 50 MG/ML IJ SOLN
12.5000 mg | Freq: Once | INTRAMUSCULAR | Status: AC
  Administered 2023-10-08: 12.5 mg via INTRAVENOUS
  Filled 2023-10-08: qty 1

## 2023-10-08 MED ORDER — METOCLOPRAMIDE HCL 5 MG/ML IJ SOLN
10.0000 mg | Freq: Once | INTRAMUSCULAR | Status: AC
  Administered 2023-10-08: 10 mg via INTRAVENOUS
  Filled 2023-10-08: qty 2

## 2023-10-08 MED ORDER — LIDOCAINE-EPINEPHRINE (PF) 2 %-1:200000 IJ SOLN
INTRAMUSCULAR | Status: AC
  Filled 2023-10-08: qty 20

## 2023-10-08 NOTE — Discharge Instructions (Signed)
I am going to try and call you back when you results come back.  If you do not get a call from me then please call your family doctor in the morning and see if they can look at the results for you.  Please return for sudden worsening headache one-sided numbness or weakness difficulty speech or swallowing confusion or persistent vomiting.

## 2023-10-08 NOTE — ED Triage Notes (Addendum)
States woke up at 0400 with headache, took 4 advil and laid back down. States every time he tried to get up he would vomit. Had a stroke 3 years ago. Had dizziness the past couple weeks. Speech clear. Denies weakness, vision changes. Continues to have headache, denies nausea presently. Able to tolerate water.  LKW 0100.

## 2023-10-08 NOTE — ED Notes (Signed)
 D/c paperwork reviewed with pt, including follow up care.  All questions and/or concerns addressed at time of d/c.  No further needs expressed. . Pt verbalized understanding, Ambulatory with family to ED exit, NAD.

## 2023-10-08 NOTE — ED Notes (Signed)
Pt with request to speak to EDP regarding d/c.  He reports he needs to go home. EDP Adela Lank made aware.

## 2023-10-08 NOTE — ED Provider Notes (Signed)
[facility] Provider Note   CSN: [id number] Arrival date & time: 10/08/23  1146     History  Chief Complaint  Patient presents with   Headache    Larry [name] is a 83 y.o. male.   Headache Associated symptoms: nausea and vomiting      83 year old male with medical history significant for DM2, HLD, HTN, CAD status post CABG, CHF, CVA, CKD, obesity presenting to the emergency department with a thunderclap headache.  The patient states that he woke up this morning at 0 400 with a sudden in onset and maximal in onset headache at the top of his head which she described of his "head being in a vice grip."  The patient states that this is the worst headache he has ever had.  He had multiple episodes of vomiting that were uncontrolled.  No fevers or chills, some neck stiffness which has resolved.  Took 4 Advil and laid back down but every time he got up he would vomit.  He has had intermittent dizziness over the past few weeks and was scheduled to get an outpatient carotid evaluation.  He denies any difficulty with speech, denies any vision change, denies any focal numbness or weakness.  He continues to endorse a headache at the top of his scalp, 6 out of 10 in severity.  Last known normal around 0100 when he went to sleep.  Home Medications Prior to Admission medications   Medication Sig Start Date End Date Taking? Authorizing Provider  Blood Glucose Monitoring Suppl (RELION CONFIRM GLUCOSE MONITOR) w/Device KIT Use as directed 06/14/22   Plotnikov, Georgina Quint, MD  carvedilol (COREG) 12.5 MG tablet TAKE 1 TABLET BY MOUTH TWICE DAILY WITH A MEAL 08/19/23   Plotnikov, Georgina Quint, MD  Cholecalciferol (VITAMIN D3) 1000 UNITS CAPS Take 1,000 Units by mouth daily.    [provider]  Cyanocobalamin (VITAMIN B-12) 1000 MCG SUBL Place 1 tablet (1,000 mcg total) under the tongue daily. 06/20/20   Plotnikov, Georgina Quint, MD  fluconazole (DIFLUCAN)  100 MG tablet Take 1 tablet by mouth once daily 12/10/21   Plotnikov, Georgina Quint, MD  fluorouracil (EFUDEX) 5 % cream Apply topically 2 (two) times daily.    [provider]  fluticasone (FLONASE) 50 MCG/ACT nasal spray Place 2 sprays into both nostrils daily. 03/06/22 11/21/22  Plotnikov, Georgina Quint, MD  folic acid (FOLVITE) 1 MG tablet Take 1 tablet by mouth once daily 08/12/23   Plotnikov, Georgina Quint, MD  furosemide (LASIX) 40 MG tablet Take 1 tablet by mouth once daily 07/09/23   Plotnikov, Georgina Quint, MD  glucose blood (RELION GLUCOSE TEST STRIPS) test strip 1 each by Other route as needed for other. Use to check blood sugars twice a day 12/11/22   Plotnikov, Georgina Quint, MD  hydrALAZINE (APRESOLINE) 25 MG tablet Take 0.5 tablets (12.5 mg total) by mouth 3 (three) times daily. 09/12/23   Plotnikov, Georgina Quint, MD  hydrocortisone cream 1 % Apply 1 application topically 2 (two) times daily.    [provider]  ketoconazole (NIZORAL) 2 % cream Apply 1 Application topically 2 (two) times daily. 03/12/23 03/11/24  Plotnikov, Georgina Quint, MD  linaclotide (LINZESS) 290 MCG CAPS capsule Take 1 capsule (290 mcg total) by mouth daily as needed. 09/12/23   Plotnikov, Georgina Quint, MD  loratadine (CLARITIN) 10 MG tablet TAKE 1 TABLET BY MOUTH ONCE DAILY FOR  1  MONTH  THEN  AS  NEEDED 11/12/22  Plotnikov, Georgina Quint, MD  metFORMIN (GLUCOPHAGE) 1000 MG tablet TAKE 1 TABLET BY MOUTH TWICE DAILY WITH A MEAL. 08/19/23   Plotnikov, Georgina Quint, MD  Multiple Vitamins-Minerals (MACULAR VITAMIN BENEFIT) TABS Take 1 tablet by mouth 2 (two) times daily.    [provider]  nitroGLYCERIN (NITROSTAT) 0.4 MG SL tablet Place 1 tablet (0.4 mg total) under the tongue every 5 (five) minutes as needed for chest pain. 06/14/22   Plotnikov, Georgina Quint, MD  PREVIDENT 5000 DRY MOUTH 1.1 % GEL dental gel  08/30/20   [provider]  ReliOn Lancet Devices 30G MISC Use to check blood sugars twice a day 06/14/22    Plotnikov, Georgina Quint, MD  repaglinide (PRANDIN) 1 MG tablet TAKE 1 TABLET BY MOUTH THREE TIMES DAILY WITH MEALS 07/09/23   Plotnikov, Georgina Quint, MD  Semaglutide (RYBELSUS) 14 MG TABS Take 1 tablet (14 mg total) by mouth daily. Fax to [employer] Patient Assistance [phone]  Pt ID # [id number] 02/27/23   Plotnikov, Georgina Quint, MD  simvastatin (ZOCOR) 40 MG tablet Take 1 tablet (40 mg total) by mouth every evening. Follow-up appt w/labs is due must see provider for future refills 07/02/22   Plotnikov, Georgina Quint, MD  spironolactone (ALDACTONE) 25 MG tablet Take 0.5 tablets (12.5mg  total) by mouth daily 08/22/23   Jake Bathe, MD  thiamine 100 MG tablet Take 1 tablet (100 mg total) by mouth daily. 06/24/19   Plotnikov, Georgina Quint, MD      Allergies    Amlodipine, Verapamil, Clindamycin hcl, Farxiga [dapagliflozin], and Contrast media [iodinated contrast media]    Review of Systems   Review of Systems  Gastrointestinal:  Positive for nausea and vomiting.  Neurological:  Positive for headaches.  All other systems reviewed and are negative.   Physical Exam Updated Vital Signs BP (!) 170/65   Pulse 69   Temp 98.5 F (36.9 C)   Resp 17   Ht 6' (1.829 m)   Wt 97.5 kg   SpO2 96%   BMI 29.16 kg/m  Physical Exam Vitals and nursing note reviewed.  Constitutional:      General: He is not in acute distress. HENT:     Head: Normocephalic and atraumatic.  Eyes:     Conjunctiva/sclera: Conjunctivae normal.     Pupils: Pupils are equal, round, and reactive to light.  Neck:     Comments: No nuchal rigidity Cardiovascular:     Rate and Rhythm: Normal rate and regular rhythm.  Pulmonary:     Effort: Pulmonary effort is normal. No respiratory distress.  Abdominal:     General: There is no distension.     Tenderness: There is no guarding.  Musculoskeletal:        General: No deformity or signs of injury.     Cervical back: Neck supple.  Skin:    Findings: No lesion or rash.   Neurological:     General: No focal deficit present.     Comments: MENTAL STATUS EXAM:    Orientation: Alert and oriented to person, place and time.  Memory: Cooperative, follows commands well.  Language: Speech is clear and language is normal.   CRANIAL NERVES:    CN 2 (Optic): Visual fields intact to confrontation.  CN 3,4,6 (EOM): Pupils equal and reactive to light. Full extraocular eye movement without nystagmus.  CN 5 (Trigeminal): Facial sensation is normal, no weakness of masticatory muscles.  CN 7 (Facial): No facial weakness or asymmetry.  CN 8 (Auditory):  Auditory acuity grossly normal.  CN 9,10 (Glossophar): The uvula is midline, the palate elevates symmetrically.  CN 11 (spinal access): Normal sternocleidomastoid and trapezius strength.  CN 12 (Hypoglossal): The tongue is midline. No atrophy or fasciculations.[name]   MOTOR:  Muscle Strength: 5/5RUE, 5/5LUE, 5/5RLE, 5/5LLE.   COORDINATION:   Intact finger-to-nose, no tremor.   SENSATION:   Intact to light touch all four extremities.  GAIT: Gait Not assessed      ED Results / Procedures / Treatments   Labs (all labs ordered are listed, but only abnormal results are displayed) Labs Reviewed  CBC WITH DIFFERENTIAL/PLATELET - Abnormal; Notable for the following components:      Result Value   WBC 11.5 (*)    Lymphs Abs 4.1 (*)    All other components within normal limits  COMPREHENSIVE METABOLIC PANEL - Abnormal; Notable for the following components:   Glucose, Bld 174 (*)    BUN 28 (*)    Creatinine, Ser 1.44 (*)    GFR, Estimated 49 (*)    All other components within normal limits  PROTEIN AND GLUCOSE, CSF - Abnormal; Notable for the following components:   Glucose, CSF 92 (*)    All other components within normal limits  CBG MONITORING, ED - Abnormal; Notable for the following components:   Glucose-Capillary 158 (*)    All other components within normal limits  RESP PANEL BY RT-PCR (RSV, FLU A&B, COVID)   RVPGX2  CSF CULTURE W GRAM STAIN  LIPASE, BLOOD  CSF CELL COUNT WITH DIFFERENTIAL  CSF CELL COUNT WITH DIFFERENTIAL    EKG None  Radiology CT Head Wo Contrast Result Date: 10/08/2023 CLINICAL DATA:  Headache.  Sudden severe. EXAM: CT HEAD WITHOUT CONTRAST TECHNIQUE: Contiguous axial images were obtained from the base of the skull through the vertex without intravenous contrast. RADIATION DOSE REDUCTION: This exam was performed according to the departmental dose-optimization program which includes automated exposure control, adjustment of the mA and/or kV according to patient size and/or use of iterative reconstruction technique. COMPARISON:  None Available. FINDINGS: Brain: No acute intracranial hemorrhage. No focal mass lesion. No CT evidence of acute infarction. No midline shift or mass effect. No hydrocephalus. Basilar cisterns are patent. There are periventricular and subcortical white matter hypodensities. Generalized cortical atrophy. Vascular: No hyperdense vessel or unexpected calcification. Skull: Normal. Negative for fracture or focal lesion. Sinuses/Orbits: Paranasal sinuses and mastoid air cells are clear. Orbits are clear. Other: None. IMPRESSION: 1. No acute intracranial findings. 2. Atrophy and white matter microvascular disease. Electronically Signed   By: Genevive Bi M.D.   On: 10/08/2023 14:44    Procedures Lumbar Puncture  Date/Time: 10/08/2023 2:55 PM  Performed by: Ernie Avena, MD Authorized by: Ernie Avena, MD   Consent:    Consent obtained:  Written and verbal   Consent given by:  Patient   Risks discussed:  Bleeding, infection, pain, headache and repeat procedure   Alternatives discussed:  Alternative treatment Universal protocol:    Patient identity confirmed:  Verbally with patient and arm band Pre-procedure details:    Procedure purpose:  Diagnostic   Preparation: Patient was prepped and draped in usual sterile fashion   Anesthesia:    Anesthesia  method:  Local infiltration   Local anesthetic:  Lidocaine 1% w/o epi and lidocaine 1% WITH epi Procedure details:    Lumbar space:  L4-L5 interspace   Patient position:  Sitting   Needle gauge:  22   Needle type:  Spinal needle -  Quincke tip   Needle length (in):  3.5   Ultrasound guidance: no     Number of attempts:  3   Fluid appearance:  Blood-tinged then clearing   Tubes of fluid:  4   Total volume (ml):  16 Post-procedure details:    Puncture site:  Adhesive bandage applied and direct pressure applied   Procedure completion:  Tolerated     Medications Ordered in ED Medications  lidocaine-EPINEPHrine (XYLOCAINE W/EPI) 2 %-1:100000 (with pres) injection 20 mL (20 mLs Infiltration Not Given 10/08/23 1431)  acetaminophen (TYLENOL) tablet 650 mg (650 mg Oral Given 10/08/23 1413)  metoCLOPramide (REGLAN) injection 10 mg (10 mg Intravenous Given 10/08/23 1413)  diphenhydrAMINE (BENADRYL) injection 12.5 mg (12.5 mg Intravenous Given 10/08/23 1413)  lidocaine-EPINEPHrine (XYLOCAINE W/EPI) 2 %-1:200000 (PF) injection (  Given by Other 10/08/23 1439)    ED Course/ Medical Decision Making/ A&P                                 Medical Decision Making Amount and/or Complexity of Data Reviewed Labs: ordered. Radiology: ordered.  Risk OTC drugs. Prescription drug management.    83 year old male with medical history significant for DM2, HLD, HTN, CAD status post CABG, CHF, CVA, CKD, obesity presenting to the emergency department with a thunderclap headache.  The patient states that he woke up this morning at 0 400 with a sudden in onset and maximal in onset headache at the top of his head which she described of his "head being in a vice grip."  The patient states that this is the worst headache he has ever had.  He had multiple episodes of vomiting that were uncontrolled.  No fevers or chills, some neck stiffness which has resolved.  Took 4 Advil and laid back down but every time he got up  he would vomit.  He has had intermittent dizziness over the past few weeks and was scheduled to get an outpatient carotid evaluation.  He denies any difficulty with speech, denies any vision change, denies any focal numbness or weakness.  He continues to endorse a headache at the top of his scalp, 6 out of 10 in severity.  Last known normal around 0100 when he went to sleep.  On arrival, the patient was afebrile, not tachycardic or tachypneic, hypertensive BP 175/104, saturating 99% on room air.  Patient was neurologically intact on exam.  Presenting with a thunderclap headache that woke him up from sleep with associated nausea and vomiting.  Patient states headache is worst of his life, no focal neurologic deficits.  Symptoms have eased off some but persist.  No fevers or chills, no neck rigidity.  Discussed with the patient need for further diagnostic testing.  CT of the head was performed on my review showed no clear evidence of subarachnoid hemorrhage or intracranial hemorrhage.  Patient has a contrast allergy to iodinated contrast media with nausea and vomiting.  Discussed with the patient premedication and CT angio of the head and neck to further evaluate versus lumbar puncture as the gold standard for ruling out subarachnoid hemorrhage.  After discussion of the risks and benefits, patient would prefer to undergo lumbar puncture at this time.  IV access was obtained and the patient was administered a headache cocktail. LP was performed with successful collection of clear CSF, sent to the lab for analysis. Signout given to Dr. Adela Lank to follow-up LP results at 1500, dispo pending results  of testing and reassessment.   Final Clinical Impression(s) / ED Diagnoses Final diagnoses:  Thunderclap headache    Rx / DC Orders ED Discharge Orders     None         Ernie Avena, MD 10/08/23 [phone]

## 2023-10-08 NOTE — ED Provider Notes (Signed)
Received patient in turnover from Dr. Karene Fry.  Please see their note for further details of Hx, PE.  Briefly patient is a 83 y.o. male with a Headache .  Sudden in onset, occurred outside of typical window to rule out with CT.  Awaiting CSF studies.  The patient's Gram stain was negative.  Still awaiting cell counts.  Patient's cell counts have just resulted.  RBC count of 1 on tube #4.  Extremely unlikely to be subarachnoid.  Patient is feeling well.  No neurologic deficits.  Discharge home.    Melene Plan, DO 10/08/23 2021

## 2023-10-08 NOTE — ED Notes (Signed)
Pt ambulatory to room, steady gait 

## 2023-10-08 NOTE — ED Notes (Signed)
Pt ambulatory to bathroom, standby assistance. Declined food at this time.  Call bell within reach.

## 2023-10-10 ENCOUNTER — Telehealth: Payer: Self-pay

## 2023-10-10 NOTE — Transitions of Care (Post Inpatient/ED Visit) (Signed)
10/10/2023  Name: Larry [name] MRN: [id number] DOB: 1941/07/15  Today's TOC FU Call Status: Today's TOC FU Call Status:: Successful TOC FU Call Completed TOC FU Call Complete Date: 10/10/23 Patient's Name and Date of Birth confirmed.  Transition Care Management Follow-up Telephone Call Date of Discharge: 10/08/23 Discharge Facility: [facility] Type of Discharge: Emergency Department Reason for ED Visit: Other: (headache) How have you been since you were released from the hospital?: Better Any questions or concerns?: No  Items Reviewed: Did you receive and understand the discharge instructions provided?: Yes Medications obtained,verified, and reconciled?: Yes (Medications Reviewed) Any new allergies since your discharge?: No Dietary orders reviewed?: Yes Do you have support at home?: Yes People in Home: spouse  Medications Reviewed Today: Medications Reviewed Today     Reviewed by Karena Addison, LPN (Licensed Practical Nurse) on 10/10/23 at 1528  Med List Status: <None>   Medication Order Taking? Sig Documenting Provider Last Dose Status Informant  Blood Glucose Monitoring Suppl (RELION CONFIRM GLUCOSE MONITOR) w/Device KIT [id number] No Use as directed Plotnikov, Georgina Quint, MD Taking Active   carvedilol (COREG) 12.5 MG tablet [id number] No TAKE 1 TABLET BY MOUTH TWICE DAILY WITH A MEAL Plotnikov, Georgina Quint, MD Taking Active   Cholecalciferol (VITAMIN D3) 1000 UNITS CAPS [id number] No Take 1,000 Units by mouth daily. [provider] Taking Active Spouse/Significant Other  Cyanocobalamin (VITAMIN B-12) 1000 MCG SUBL [id number] No Place 1 tablet (1,000 mcg total) under the tongue daily. Plotnikov, Georgina Quint, MD Taking Active   fluconazole (DIFLUCAN) 100 MG tablet [id number] No Take 1 tablet by mouth once daily Plotnikov, Georgina Quint, MD Taking Active   fluorouracil (EFUDEX) 5 % cream [id number] No Apply topically 2 (two) times daily. [provider] Taking  Active   fluticasone (FLONASE) 50 MCG/ACT nasal spray [id number] No Place 2 sprays into both nostrils daily. Plotnikov, Georgina Quint, MD Taking Expired 11/21/22 2359   folic acid (FOLVITE) 1 MG tablet [id number] No Take 1 tablet by mouth once daily Plotnikov, Georgina Quint, MD Taking Active   furosemide (LASIX) 40 MG tablet [id number] No Take 1 tablet by mouth once daily Plotnikov, Georgina Quint, MD Taking Active   glucose blood (RELION GLUCOSE TEST STRIPS) test strip [id number] No 1 each by Other route as needed for other. Use to check blood sugars twice a day Plotnikov, Georgina Quint, MD Taking Active   hydrALAZINE (APRESOLINE) 25 MG tablet [id number]  Take 0.5 tablets (12.5 mg total) by mouth 3 (three) times daily. Plotnikov, Georgina Quint, MD  Active   hydrocortisone cream 1 % [id number] No Apply 1 application topically 2 (two) times daily. [provider] Taking Active   ketoconazole (NIZORAL) 2 % cream [id number] No Apply 1 Application topically 2 (two) times daily. Plotnikov, Georgina Quint, MD Taking Active   linaclotide Karlene Einstein) 290 MCG CAPS capsule [id number]  Take 1 capsule (290 mcg total) by mouth daily as needed. Plotnikov, Georgina Quint, MD  Active   loratadine (CLARITIN) 10 MG tablet [id number] No TAKE 1 TABLET BY MOUTH ONCE DAILY FOR  1  MONTH  THEN  AS  NEEDED Plotnikov, Georgina Quint, MD Taking Active   metFORMIN (GLUCOPHAGE) 1000 MG tablet [id number] No TAKE 1 TABLET BY MOUTH TWICE DAILY WITH A MEAL. Plotnikov, Georgina Quint, MD Taking Active   Multiple Vitamins-Minerals (MACULAR VITAMIN BENEFIT) TABS [id number] No Take 1 tablet by mouth 2 (two) times daily. [provider] Taking Active Spouse/Significant Other  nitroGLYCERIN (NITROSTAT) 0.4 MG SL tablet [id number] No  Place 1 tablet (0.4 mg total) under the tongue every 5 (five) minutes as needed for chest pain. Plotnikov, Georgina Quint, MD Taking Active   PREVIDENT 5000 DRY MOUTH 1.1 % GEL dental gel [id number] No  [provider] Taking Active   ReliOn  Lancet Devices 30G MISC [id number] No Use to check blood sugars twice a day Plotnikov, Georgina Quint, MD Taking Active   repaglinide (PRANDIN) 1 MG tablet [id number] No TAKE 1 TABLET BY MOUTH THREE TIMES DAILY WITH MEALS Plotnikov, Georgina Quint, MD Taking Active   Semaglutide (RYBELSUS) 14 MG TABS [id number] No Take 1 tablet (14 mg total) by mouth daily. Fax to [employer] Patient Assistance [phone]  Pt ID # [id number] Plotnikov, Georgina Quint, MD Taking Active   simvastatin (ZOCOR) 40 MG tablet [id number] No Take 1 tablet (40 mg total) by mouth every evening. Follow-up appt w/labs is due must see provider for future refills Plotnikov, Georgina Quint, MD Taking Active   spironolactone (ALDACTONE) 25 MG tablet [id number] No Take 0.5 tablets (12.5mg  total) by mouth daily Jake Bathe, MD Taking Active   thiamine 100 MG tablet [id number] No Take 1 tablet (100 mg total) by mouth daily. Plotnikov, Georgina Quint, MD Taking Active   Med List Note Dorisann Frames 06/20/17 1822): Pt has CPAP Machine            Home Care and Equipment/Supplies: Were Home Health Services Ordered?: NA Any new equipment or medical supplies ordered?: NA  Functional Questionnaire: Do you need assistance with bathing/showering or dressing?: No Do you need assistance with meal preparation?: No Do you need assistance with eating?: No Do you have difficulty maintaining continence: No Do you need assistance with getting out of bed/getting out of a chair/moving?: No Do you have difficulty managing or taking your medications?: No  Follow up appointments reviewed: PCP Follow-up appointment confirmed?: No MD Provider Line Number:[phone] Given: No Specialist Hospital Follow-up appointment confirmed?: Yes Date of Specialist follow-up appointment?: 10/14/23 Follow-Up Specialty Provider:: cardio Do you need transportation to your follow-up appointment?: No Do you understand care options if your condition(s) worsen?: Yes-patient  verbalized understanding    SIGNATURE Karena Addison, LPN [facility] Nurse Health Advisor Direct Dial [phone]

## 2023-10-12 LAB — CSF CULTURE W GRAM STAIN
Culture: NO GROWTH
Gram Stain: NONE SEEN

## 2023-10-14 ENCOUNTER — Ambulatory Visit (INDEPENDENT_AMBULATORY_CARE_PROVIDER_SITE_OTHER): Payer: Medicare Other | Admitting: Physician Assistant

## 2023-10-14 VITALS — BP 166/85 | HR 62 | Temp 98.1°F | Ht 72.0 in | Wt 226.4 lb

## 2023-10-14 DIAGNOSIS — I6523 Occlusion and stenosis of bilateral carotid arteries: Secondary | ICD-10-CM | POA: Diagnosis not present

## 2023-10-14 DIAGNOSIS — R42 Dizziness and giddiness: Secondary | ICD-10-CM | POA: Diagnosis not present

## 2023-10-14 NOTE — Progress Notes (Signed)
 History of Present Illness:  Patient is a 83 y.o. year old male who presents for evaluation of carotid stenosis.  He was last seen by DR. Brabham in 2020.  He is status post left carotid endarterectomy with patch angioplasty on 08/22/2017.  This was done for asymptomatic left carotid stenosis.  The patient's carotid stenosis was detected during a preoperative workup for CABG.  The patient denies symptoms of TIA, amaurosis, or stroke.  He was last seen on 08/25/2018.    He has had some dizziness recently, but if he stands up slowly he does not have dizziness. He denies amaurosis, aphasia, and dysarthria.     He is medically managed on a daily statin.          Past Medical History:  Diagnosis Date   Carotid stenosis, left    Cataracts, bilateral    removed   Coronary artery disease    Diabetes (HCC)    Heart valve problem    Hyperlipemia    Prostate CA (HCC)    Sleep apnea    Dr Christophe Louis ; uses CPAP   Stroke [facility])    Tubular adenoma of colon 02/2016   Type II or unspecified type diabetes mellitus without mention of complication, not stated as uncontrolled    Unspecified essential hypertension     Past Surgical History:  Procedure Laterality Date   APPENDECTOMY     CARDIAC CATHETERIZATION     CATARACT EXTRACTION, BILATERAL     COLONOSCOPY     CORONARY ARTERY BYPASS GRAFT N/A 06/27/2017   Procedure: CORONARY ARTERY BYPASS GRAFTING (CABG), time four   using the left internal mammary artery, and right saphenous leg vein harvested endoscopically.;  Surgeon: Kerin Perna, MD;  Location: [facility] OR;  Service: Open Heart Surgery;  Laterality: N/A;   CORONARY ULTRASOUND/IVUS N/A 06/24/2017   Procedure: Intravascular Ultrasound/IVUS;  Surgeon: Lyn Records, MD;  Location: [facility] INVASIVE CV LAB;  Service: Cardiovascular;  Laterality: N/A;  LEFT MAIN   CYSTOSCOPY N/A 06/27/2017   Procedure: CYSTOSCOPY FLEXIBLE, Balloon dilatation placement of foley catheter;  Surgeon: Kerin Perna,  MD;  Location: [facility] OR;  Service: Open Heart Surgery;  Laterality: N/A;   ENDARTERECTOMY Left 08/22/2017   Procedure: ENDARTERECTOMY CAROTID LEFT;  Surgeon: Nada Libman, MD;  Location: [facility] OR;  Service: Vascular;  Laterality: Left;   INGUINAL HERNIA REPAIR Left    LEFT HEART CATH AND CORONARY ANGIOGRAPHY N/A 06/24/2017   Procedure: LEFT HEART CATH AND CORONARY ANGIOGRAPHY;  Surgeon: Lyn Records, MD;  Location: [facility] INVASIVE CV LAB;  Service: Cardiovascular;  Laterality: N/A;   PATCH ANGIOPLASTY Left 08/22/2017   Procedure: PATCH ANGIOPLASTY USING Livia Snellen BIOLOGIC PATCH;  Surgeon: Nada Libman, MD;  Location: [facility] OR;  Service: Vascular;  Laterality: Left;   PROSTATECTOMY     TEE WITHOUT CARDIOVERSION N/A 06/27/2017   Procedure: TRANSESOPHAGEAL ECHOCARDIOGRAM (TEE);  Surgeon: Donata Clay, Theron Arista, MD;  Location: [facility] OR;  Service: Open Heart Surgery;  Laterality: N/A;   TONSILLECTOMY     ULTRASOUND GUIDANCE FOR VASCULAR ACCESS  06/24/2017   Procedure: Ultrasound Guidance For Vascular Access;  Surgeon: Lyn Records, MD;  Location: [facility] INVASIVE CV LAB;  Service: Cardiovascular;;     Social History Social History   Tobacco Use   Smoking status: Never   Smokeless tobacco: Never  Vaping Use   Vaping status: Never Used  Substance Use Topics   Alcohol use: No    Alcohol/week: 0.0  standard drinks of alcohol   Drug use: No    Family History Family History  Problem Relation Age of Onset   Heart disease Mother    Heart disease Father    Heart attack Brother    Diabetes Other    Hypertension Other    Colon cancer Neg Hx    Esophageal cancer Neg Hx    Rectal cancer Neg Hx    Stomach cancer Neg Hx     Allergies  Allergies  Allergen Reactions   Amlodipine Swelling    Leg swelling   Verapamil Swelling    Edema w/high dose   Clindamycin Hcl     UNSPECIFIED REACTION    Farxiga [Dapagliflozin]     Penile swelling, leakage   Contrast Media [Iodinated Contrast Media] Nausea And  Vomiting     Current Outpatient Medications  Medication Sig Dispense Refill   Blood Glucose Monitoring Suppl (RELION CONFIRM GLUCOSE MONITOR) w/Device KIT Use as directed 1 kit 0   carvedilol (COREG) 12.5 MG tablet TAKE 1 TABLET BY MOUTH TWICE DAILY WITH A MEAL 180 tablet 0   Cholecalciferol (VITAMIN D3) 1000 UNITS CAPS Take 1,000 Units by mouth daily.     Cyanocobalamin (VITAMIN B-12) 1000 MCG SUBL Place 1 tablet (1,000 mcg total) under the tongue daily. 100 tablet 3   fluconazole (DIFLUCAN) 100 MG tablet Take 1 tablet by mouth once daily 14 tablet 0   fluorouracil (EFUDEX) 5 % cream Apply topically 2 (two) times daily.     folic acid (FOLVITE) 1 MG tablet Take 1 tablet by mouth once daily 90 tablet 0   furosemide (LASIX) 40 MG tablet Take 1 tablet by mouth once daily 90 tablet 3   glucose blood (RELION GLUCOSE TEST STRIPS) test strip 1 each by Other route as needed for other. Use to check blood sugars twice a day 100 each 11   hydrALAZINE (APRESOLINE) 25 MG tablet Take 0.5 tablets (12.5 mg total) by mouth 3 (three) times daily.     hydrocortisone cream 1 % Apply 1 application topically 2 (two) times daily.     ketoconazole (NIZORAL) 2 % cream Apply 1 Application topically 2 (two) times daily. 45 g 1   linaclotide (LINZESS) 290 MCG CAPS capsule Take 1 capsule (290 mcg total) by mouth daily as needed. 30 capsule 5   loratadine (CLARITIN) 10 MG tablet TAKE 1 TABLET BY MOUTH ONCE DAILY FOR  1  MONTH  THEN  AS  NEEDED 100 tablet 0   metFORMIN (GLUCOPHAGE) 1000 MG tablet TAKE 1 TABLET BY MOUTH TWICE DAILY WITH A MEAL. 180 tablet 0   Multiple Vitamins-Minerals (MACULAR VITAMIN BENEFIT) TABS Take 1 tablet by mouth 2 (two) times daily.     nitroGLYCERIN (NITROSTAT) 0.4 MG SL tablet Place 1 tablet (0.4 mg total) under the tongue every 5 (five) minutes as needed for chest pain. 20 tablet 3   PREVIDENT 5000 DRY MOUTH 1.1 % GEL dental gel      ReliOn Lancet Devices 30G MISC Use to check blood sugars  twice a day 180 each 1   repaglinide (PRANDIN) 1 MG tablet TAKE 1 TABLET BY MOUTH THREE TIMES DAILY WITH MEALS 270 tablet 3   Semaglutide (RYBELSUS) 14 MG TABS Take 1 tablet (14 mg total) by mouth daily. Fax to [employer] Patient Assistance [phone]  Pt ID # [id number] 120 tablet 5   simvastatin (ZOCOR) 40 MG tablet Take 1 tablet (40 mg total) by mouth every evening. Follow-up appt w/labs  is due must see provider for future refills 90 tablet 0   spironolactone (ALDACTONE) 25 MG tablet Take 0.5 tablets (12.5mg  total) by mouth daily 45 tablet 1   thiamine 100 MG tablet Take 1 tablet (100 mg total) by mouth daily. 90 tablet 3   fluticasone (FLONASE) 50 MCG/ACT nasal spray Place 2 sprays into both nostrils daily. 1 g 0   No current facility-administered medications for this visit.    ROS:   General:  No weight loss, Fever, chills  HEENT: No recent headaches, no nasal bleeding, no visual changes, no sore throat  Neurologic: No dizziness, blackouts, seizures. No recent symptoms of stroke or mini- stroke. No recent episodes of slurred speech, or temporary blindness.  Cardiac: No recent episodes of chest pain/pressure, no shortness of breath at rest.  No shortness of breath with exertion.  Denies history of atrial fibrillation or irregular heartbeat  Vascular: No history of rest pain in feet.  No history of claudication.  No history of non-healing ulcer, No history of DVT   Pulmonary: No home oxygen, no productive cough, no hemoptysis,  No asthma or wheezing  Musculoskeletal:  [ ]  Arthritis, [ ]  Low back pain,  [ ]  Joint pain  Hematologic:No history of hypercoagulable state.  No history of easy bleeding.  No history of anemia  Gastrointestinal: No hematochezia or melena,  No gastroesophageal reflux, no trouble swallowing  Urinary: [ ]  chronic Kidney disease, [ ]  on HD - [ ]  MWF or [ ]  TTHS, [ ]  Burning with urination, [ ]  Frequent urination, [ ]  Difficulty urinating;   Skin: No  rashes  Psychological: No history of anxiety,  No history of depression   Physical Examination  Vitals:   10/14/23 0913 10/14/23 0914  BP: (!) 173/97 (!) 166/85  Pulse: 62   Temp: 98.1 F (36.7 C)   TempSrc: Temporal   SpO2: 97%   Weight: 226 lb 6.4 oz (102.7 kg)   Height: 6' (1.829 m)     Body mass index is 30.71 kg/m.  General:  Alert and oriented, no acute distress HEENT: Normal Neck: No bruit or JVD Pulmonary: Clear to auscultation bilaterally Cardiac: Regular Rate and Rhythm without murmur Gastrointestinal: Soft, non-tender, non-distended, no mass, no scars Skin: No rash Extremity Pulses:   radial pulses bilaterally Musculoskeletal: No deformity or edema  Neurologic: Upper and lower extremity motor 5/5 and symmetric  DATA:  None to date  ASSESSMENT/PLAN: History of asymptomatic ICA stenopsis s/p left carotid endarterectomy with patch angioplasty on 08/22/2017. He was lost to follow up and has had on/off dizziness.  I will schedule him for f/u carotid duplex in the next few weeks.   On his last duplex he had Right Carotid: Velocities in the right ICA are consistent with a 40-59% stenosis. Non-hemodynamically significant plaque <50% not the CCA.   Left Carotid: Velocities in the left ICA are consistent with a 60-79%  stenosis.    He states as long as he stands up slow he is not dizziness.  He denies symptoms of stroke.  He agrees to f/u for duplex study.  If he develops symptoms of stroke or TIA he will call 911.        Mosetta Pigeon PA-C [facility] [place] Office: [phone]  MD in clinic [place]

## 2023-10-17 ENCOUNTER — Other Ambulatory Visit: Payer: Self-pay | Admitting: *Deleted

## 2023-10-17 DIAGNOSIS — I6523 Occlusion and stenosis of bilateral carotid arteries: Secondary | ICD-10-CM

## 2023-10-22 ENCOUNTER — Telehealth: Payer: Self-pay | Admitting: Internal Medicine

## 2023-10-22 NOTE — Telephone Encounter (Signed)
 Spoke with pt and was able to inform him of me getting his forms and that it is okay to take the (2) 7mg  of Rybelsus.  120 day supply to be selected.

## 2023-10-22 NOTE — Telephone Encounter (Signed)
 Copied from CRM [id number]. Topic: General - Other >> Oct 22, 2023  2:51 PM Fredrich Romans wrote: Reason for CRM: Patient would like a call from Byrd Hesselbach to let him know that she has received the rybulsus medication reorder application,and also to let him know if it would be okay for him to take 2 (7) instead of 1 (14) of the rybelsus medication?

## 2023-11-04 ENCOUNTER — Ambulatory Visit (HOSPITAL_COMMUNITY)
Admission: RE | Admit: 2023-11-04 | Discharge: 2023-11-04 | Disposition: A | Discharge status: Home / Self Care | Payer: Medicare Other | Source: Ambulatory Visit | Attending: Surgery | Admitting: Surgery

## 2023-11-04 ENCOUNTER — Ambulatory Visit (INDEPENDENT_AMBULATORY_CARE_PROVIDER_SITE_OTHER): Payer: Medicare Other | Admitting: Physician Assistant

## 2023-11-04 VITALS — BP 124/64 | HR 68 | Temp 98.0°F | Ht 72.0 in | Wt 223.4 lb

## 2023-11-04 DIAGNOSIS — I6523 Occlusion and stenosis of bilateral carotid arteries: Secondary | ICD-10-CM

## 2023-11-04 NOTE — Progress Notes (Signed)
 Office Note   History of Present Illness   Larry [name] is a 83 y.o. (1941/01/10) male who presents for surveillance of carotid artery stenosis.  He has a history of left carotid endarterectomy on 08/22/2017 by Dr. Myra Gianotti.  This was done for asymptomatic critical left ICA stenosis, which was incidentally found during CABG workup.  He was last seen in our office a few weeks ago.  Prior to this he was lost to follow-up since 2020.  At his last office visit he was dealing with new onset dizziness when standing up too quickly.  He denied any other strokelike symptoms.  He returns today for follow-up and new carotid studies.  He states his dizziness issues have completely gone away.  He also denies any other strokelike symptoms such as slurred speech, facial droop, sudden visual changes, or sudden weakness/numbness.  Current Outpatient Medications  Medication Sig Dispense Refill   Blood Glucose Monitoring Suppl (RELION CONFIRM GLUCOSE MONITOR) w/Device KIT Use as directed 1 kit 0   carvedilol (COREG) 12.5 MG tablet TAKE 1 TABLET BY MOUTH TWICE DAILY WITH A MEAL 180 tablet 0   Cholecalciferol (VITAMIN D3) 1000 UNITS CAPS Take 1,000 Units by mouth daily.     Cyanocobalamin (VITAMIN B-12) 1000 MCG SUBL Place 1 tablet (1,000 mcg total) under the tongue daily. 100 tablet 3   fluconazole (DIFLUCAN) 100 MG tablet Take 1 tablet by mouth once daily 14 tablet 0   fluorouracil (EFUDEX) 5 % cream Apply topically 2 (two) times daily.     folic acid (FOLVITE) 1 MG tablet Take 1 tablet by mouth once daily 90 tablet 0   furosemide (LASIX) 40 MG tablet Take 1 tablet by mouth once daily 90 tablet 3   glucose blood (RELION GLUCOSE TEST STRIPS) test strip 1 each by Other route as needed for other. Use to check blood sugars twice a day 100 each 11   hydrALAZINE (APRESOLINE) 25 MG tablet Take 0.5 tablets (12.5 mg total) by mouth 3 (three) times daily.     hydrocortisone cream 1 % Apply 1 application topically 2  (two) times daily.     ketoconazole (NIZORAL) 2 % cream Apply 1 Application topically 2 (two) times daily. 45 g 1   linaclotide (LINZESS) 290 MCG CAPS capsule Take 1 capsule (290 mcg total) by mouth daily as needed. 30 capsule 5   loratadine (CLARITIN) 10 MG tablet TAKE 1 TABLET BY MOUTH ONCE DAILY FOR  1  MONTH  THEN  AS  NEEDED 100 tablet 0   metFORMIN (GLUCOPHAGE) 1000 MG tablet TAKE 1 TABLET BY MOUTH TWICE DAILY WITH A MEAL. 180 tablet 0   Multiple Vitamins-Minerals (MACULAR VITAMIN BENEFIT) TABS Take 1 tablet by mouth 2 (two) times daily.     nitroGLYCERIN (NITROSTAT) 0.4 MG SL tablet Place 1 tablet (0.4 mg total) under the tongue every 5 (five) minutes as needed for chest pain. 20 tablet 3   PREVIDENT 5000 DRY MOUTH 1.1 % GEL dental gel      ReliOn Lancet Devices 30G MISC Use to check blood sugars twice a day 180 each 1   repaglinide (PRANDIN) 1 MG tablet TAKE 1 TABLET BY MOUTH THREE TIMES DAILY WITH MEALS 270 tablet 3   Semaglutide (RYBELSUS) 14 MG TABS Take 1 tablet (14 mg total) by mouth daily. Fax to [employer] Patient Assistance [phone]  Pt ID # [id number] 120 tablet 5   simvastatin (ZOCOR) 40 MG tablet Take 1 tablet (40 mg total) by  mouth every evening. Follow-up appt w/labs is due must see provider for future refills 90 tablet 0   spironolactone (ALDACTONE) 25 MG tablet Take 0.5 tablets (12.5mg  total) by mouth daily 45 tablet 1   thiamine 100 MG tablet Take 1 tablet (100 mg total) by mouth daily. 90 tablet 3   fluticasone (FLONASE) 50 MCG/ACT nasal spray Place 2 sprays into both nostrils daily. 1 g 0   No current facility-administered medications for this visit.    REVIEW OF SYSTEMS (negative unless checked):   Cardiac:  []  Chest pain or chest pressure? []  Shortness of breath upon activity? []  Shortness of breath when lying flat? []  Irregular heart rhythm?  Vascular:  []  Pain in calf, thigh, or hip brought on by walking? []  Pain in feet at night that wakes you up  from your sleep? []  Blood clot in your veins? []  Leg swelling?  Pulmonary:  []  Oxygen at home? []  Productive cough? []  Wheezing?  Neurologic:  []  Sudden weakness in arms or legs? []  Sudden numbness in arms or legs? []  Sudden onset of difficult speaking or slurred speech? []  Temporary loss of vision in one eye? []  Problems with dizziness?  Gastrointestinal:  []  Blood in stool? []  Vomited blood?  Genitourinary:  []  Burning when urinating? []  Blood in urine?  Psychiatric:  []  Major depression  Hematologic:  []  Bleeding problems? []  Problems with blood clotting?  Dermatologic:  []  Rashes or ulcers?  Constitutional:  []  Fever or chills?  Ear/Nose/Throat:  []  Change in hearing? []  Nose bleeds? []  Sore throat?  Musculoskeletal:  []  Back pain? []  Joint pain? []  Muscle pain?   Physical Examination   Vitals:   11/04/23 1235 11/04/23 1238  BP: 126/70 124/64  Pulse: 68   Temp: 98 F (36.7 C)   TempSrc: Temporal   SpO2: 96%   Weight: 223 lb 6.4 oz (101.3 kg)   Height: 6' (1.829 m)    Body mass index is 30.3 kg/m.  General:  WDWN in NAD; vital signs documented above Gait: Not observed HENT: WNL, normocephalic Pulmonary: normal non-labored breathing , without rales, rhonchi,  wheezing Cardiac: regular HR, without murmurs without carotid bruit Abdomen: soft, NT, no masses Skin: without rashes Vascular Exam/Pulses: palpable radial pulses bilaterally Extremities: without ischemic changes, without gangrene , without cellulitis; without open wounds;  Musculoskeletal: no muscle wasting or atrophy  Neurologic: A&O X 3;  No focal weakness or paresthesias are detected Psychiatric:  The pt has Normal affect.  Non-Invasive Vascular Imaging   Bilateral Carotid Duplex (11/04/2023):  R ICA stenosis:  40-59% R VA:  patent and antegrade L ICA stenosis:  1-39% L VA:  patent and antegrade   Medical Decision Making   Larry [name] is a 83 y.o. male who presents  for surveillance of carotid artery stenosis  Based on the patient's vascular studies, his carotid artery stenosis is stable bilaterally.  His right ICA stenosis is 40 to 59%.  His left carotid endarterectomy site is patent with 1 to 39% restenosis. He denies any strokelike symptoms such as slurred speech, facial droop, sudden visual changes, or sudden weakness/numbness.  He no longer has any dizziness spells when standing up.  I have discussed with the patient that his dizziness was likely due to sudden positional changes and not due to his carotid stenosis. He palpable and equal radial pulses bilaterally. He has no carotid bruits. He can follow-up with our office in 1 year with repeat carotid duplex   Loel Dubonnet  PA-C [facility] Office: [phone]  Clinic MD: Myra Gianotti

## 2023-11-06 DIAGNOSIS — L570 Actinic keratosis: Secondary | ICD-10-CM | POA: Diagnosis not present

## 2023-11-06 DIAGNOSIS — Z859 Personal history of malignant neoplasm, unspecified: Secondary | ICD-10-CM | POA: Diagnosis not present

## 2023-11-06 DIAGNOSIS — L820 Inflamed seborrheic keratosis: Secondary | ICD-10-CM | POA: Diagnosis not present

## 2023-11-06 DIAGNOSIS — D692 Other nonthrombocytopenic purpura: Secondary | ICD-10-CM | POA: Diagnosis not present

## 2023-11-06 DIAGNOSIS — L578 Other skin changes due to chronic exposure to nonionizing radiation: Secondary | ICD-10-CM | POA: Diagnosis not present

## 2023-11-06 DIAGNOSIS — L821 Other seborrheic keratosis: Secondary | ICD-10-CM | POA: Diagnosis not present

## 2023-11-06 DIAGNOSIS — D1801 Hemangioma of skin and subcutaneous tissue: Secondary | ICD-10-CM | POA: Diagnosis not present

## 2023-11-13 ENCOUNTER — Other Ambulatory Visit: Payer: Self-pay | Admitting: Internal Medicine

## 2023-11-13 ENCOUNTER — Telehealth: Payer: Self-pay | Admitting: Internal Medicine

## 2023-11-13 DIAGNOSIS — H40013 Open angle with borderline findings, low risk, bilateral: Secondary | ICD-10-CM | POA: Diagnosis not present

## 2023-11-13 LAB — HM DIABETES EYE EXAM

## 2023-11-13 NOTE — Telephone Encounter (Signed)
 We have received patient assistance from [employer] and it has been placed in the PCP'S box.   Please fax to: [phone]

## 2023-11-14 ENCOUNTER — Encounter: Payer: Self-pay | Admitting: Internal Medicine

## 2023-11-18 NOTE — Telephone Encounter (Signed)
 Copied from CRM [id number]. Topic: General - Other >> Nov 18, 2023  3:56 PM Gurney Maxin H wrote: Reason for CRM: Patient states he received a letter from [employer] stating that they received paperwork back for patient but Dr. Posey Rea didn't sign on page 8 and page 9 needs to indicate the medication Semaglutide (RYBELSUS) 14 MG TABS.   Norvil Nordisk [phone] fax  [place] [phone]

## 2023-11-19 ENCOUNTER — Other Ambulatory Visit: Payer: Self-pay | Admitting: Internal Medicine

## 2023-11-21 ENCOUNTER — Encounter: Payer: Self-pay | Admitting: Cardiology

## 2023-11-21 ENCOUNTER — Ambulatory Visit: Payer: Medicare Other | Attending: Cardiology | Admitting: Cardiology

## 2023-11-21 VITALS — BP 132/50 | HR 68 | Ht 72.0 in | Wt 224.8 lb

## 2023-11-21 DIAGNOSIS — I6523 Occlusion and stenosis of bilateral carotid arteries: Secondary | ICD-10-CM | POA: Diagnosis not present

## 2023-11-21 DIAGNOSIS — E1159 Type 2 diabetes mellitus with other circulatory complications: Secondary | ICD-10-CM | POA: Diagnosis not present

## 2023-11-21 DIAGNOSIS — I25709 Atherosclerosis of coronary artery bypass graft(s), unspecified, with unspecified angina pectoris: Secondary | ICD-10-CM | POA: Diagnosis not present

## 2023-11-21 NOTE — Progress Notes (Signed)
 Cardiology Office Note:  .   Date:  11/21/2023  ID:  Larry [name], DOB 1941/07/29, MRN [id number] PCP: Tresa Garter, MD  [facility] Providers Cardiologist:  Donato Schultz, MD Sleep Medicine:  Armanda Magic, MD    History of Present Illness: .   Larry [name] is a 83 y.o. male Discussed the use of AI scribe software for clinical note transcription with the patient, who gave verbal consent to proceed.  History of Present Illness Larry [name] "[name]" is an 83 year old male with coronary artery disease status post bypass who presents for a one year follow-up.  He underwent coronary artery bypass grafting (CABG) in 2018, which included LIMA to LAD, SVG to RAMUS, SVG to OM, and SVG to RCA. He has a history of coronary artery disease and is currently taking carvedilol 12.5 mg twice daily. No new cardiac symptoms are reported.  He has chronic diastolic heart failure with an echocardiogram in 2023 showing an ejection fraction of 60% and grade two diastolic dysfunction. He experiences minor lower extremity edema, which is managed with furosemide 40 mg daily and spironolactone 12.5 mg daily. An estimated pulmonary pressure of 36 mmHg and a moderately dilated left atrium were noted.  Type 2 diabetes is well-controlled with a recent hemoglobin A1c of 6.1. He is taking Rybelsus, which has also aided in weight loss. His most recent lab results include a hemoglobin A1c of 6.1, hemoglobin of 13.4, creatinine of 1.4, and potassium of 4.5.  Hypertension is managed with carvedilol, spironolactone, and hydralazine. Blood pressure control is adequate, but he must be careful when changing positions due to potential dizziness.  Hyperlipidemia is managed with simvastatin, with a recent LDL of 67, indicating good control.  He has peripheral arterial disease and underwent a left carotid endarterectomy in January 2019. He is doing well post-procedure and continues with secondary prevention  measures.  Obstructive sleep apnea is well-managed with CPAP therapy, and he is compliant with its use.  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 He plays golf and walks occasionally but notes he must be careful when rising from a seated position to avoid feeling wobbly.      Studies Reviewed: .        Results LABS LDL: 67 HbA1c: 6.1 Hb: 13.4 Cr: 1.4 K: 4.5  DIAGNOSTIC Echocardiogram: Ejection fraction 60%, grade 2 diastolic dysfunction, estimated pulmonary pressure 36 mmHg, moderately dilated left atrium (2023) Risk Assessment/Calculations:            Physical Exam:   VS:  BP (!) 132/50   Pulse 68   Ht 6' (1.829 m)   Wt 224 lb 12.8 oz (102 kg)   SpO2 97%   BMI 30.49 kg/m    Wt Readings from Last 3 Encounters:  11/21/23 224 lb 12.8 oz (102 kg)  11/04/23 223 lb 6.4 oz (101.3 kg)  10/14/23 226 lb 6.4 oz (102.7 kg)    GEN: Well nourished, well developed in no acute distress NECK: No JVD; No carotid bruits CARDIAC: RRR, no murmurs, no rubs, no gallops RESPIRATORY:  Clear to auscultation without rales, wheezing or rhonchi  ABDOMEN: Soft, non-tender, non-distended EXTREMITIES:  Mild BLE edema; No deformity   ASSESSMENT AND PLAN: .    Assessment and Plan Assessment & Plan Coronary artery disease status post coronary artery bypass grafting One-year follow-up for coronary artery disease post-CABG in 2018 with LIMA to LAD, SVG to RAMUS, SVG  to OM, and SVG to RCA. Manages condition with carvedilol and reports overall well-being. Vigilant for syncope. - Continue carvedilol 12.5 mg twice daily  Chronic diastolic heart failure Chronic diastolic heart failure with 2023 echocardiogram showing ejection fraction of 60% and grade 2 diastolic dysfunction. Left atrium moderately dilated, pulmonary pressure 36 mmHg. Minor lower extremity edema managed with furosemide and spironolactone. - Continue furosemide 40 mg daily -  Continue spironolactone 12.5 mg daily  Hypertension Hypertension managed with carvedilol, spironolactone, and hydralazine. Blood pressure monitored. Advised caution when rising from seated position to avoid dizziness. - Continue hydralazine 12.5 mg three times daily  Type 2 diabetes mellitus Type 2 diabetes mellitus with recent hemoglobin A1c of 6.1. Rybelsus aiding in weight loss and A1c maintenance. Not on insulin therapy. - Continue Rybelsus as prescribed - Monitor hemoglobin A1c regularly  Hyperlipidemia Hyperlipidemia managed with simvastatin, recent LDL 67 mg/dL. Lipid levels well-controlled. - Continue simvastatin 40 mg daily  Peripheral arterial disease and carotid artery disease Status post left carotid endarterectomy. Under vascular surgery care for peripheral arterial disease. On secondary prevention measures and doing well. - Continue secondary prevention measures - Follow up with vascular surgery as needed  Obstructive sleep apnea Obstructive sleep apnea compliant with CPAP therapy. - Continue CPAP therapy     Signed, Donato Schultz, MD

## 2023-11-21 NOTE — Patient Instructions (Signed)
 Medication Instructions:  The current medical regimen is effective;  continue present plan and medications.  *If you need a refill on your cardiac medications before your next appointment, please call your pharmacy*  Follow-Up: At [facility], you and your health needs are our priority.  As part of our continuing mission to provide you with exceptional heart care, our providers are all part of one team.  This team includes your primary Cardiologist (physician) and Advanced Practice Providers or APPs (Physician Assistants and Nurse Practitioners) who all work together to provide you with the care you need, when you need it.  Your next appointment:   1 year(s)  Provider:   Donato Schultz, MD    We recommend signing up for the patient portal called "MyChart".  Sign up information is provided on this After Visit Summary.  MyChart is used to connect with patients for Virtual Visits (Telemedicine).  Patients are able to view lab/test results, encounter notes, upcoming appointments, etc.  Non-urgent messages can be sent to your provider as well.   To learn more about what you can do with MyChart, go to [email or web address].         1st Floor: - Lobby - Registration  - Pharmacy  - Lab - Cafe  2nd Floor: - PV Lab - Diagnostic Testing (echo, CT, nuclear med)  3rd Floor: - Vacant  4th Floor: - TCTS (cardiothoracic surgery) - AFib Clinic - Structural Heart Clinic - Vascular Surgery  - Vascular Ultrasound  5th Floor: - HeartCare Cardiology (general and EP) - Clinical Pharmacy for coumadin, hypertension, lipid, weight-loss medications, and med management appointments    Valet parking services will be available as well.

## 2023-12-11 ENCOUNTER — Encounter: Payer: Self-pay | Admitting: Internal Medicine

## 2023-12-11 ENCOUNTER — Ambulatory Visit (INDEPENDENT_AMBULATORY_CARE_PROVIDER_SITE_OTHER): Payer: Medicare Other | Admitting: Internal Medicine

## 2023-12-11 VITALS — BP 108/54 | HR 75 | Temp 97.5°F | Ht 72.0 in | Wt 220.4 lb

## 2023-12-11 DIAGNOSIS — E538 Deficiency of other specified B group vitamins: Secondary | ICD-10-CM

## 2023-12-11 DIAGNOSIS — E785 Hyperlipidemia, unspecified: Secondary | ICD-10-CM

## 2023-12-11 DIAGNOSIS — E1159 Type 2 diabetes mellitus with other circulatory complications: Secondary | ICD-10-CM

## 2023-12-11 DIAGNOSIS — N183 Chronic kidney disease, stage 3 unspecified: Secondary | ICD-10-CM | POA: Diagnosis not present

## 2023-12-11 DIAGNOSIS — Z7984 Long term (current) use of oral hypoglycemic drugs: Secondary | ICD-10-CM

## 2023-12-11 DIAGNOSIS — I10 Essential (primary) hypertension: Secondary | ICD-10-CM

## 2023-12-11 DIAGNOSIS — Z8546 Personal history of malignant neoplasm of prostate: Secondary | ICD-10-CM | POA: Diagnosis not present

## 2023-12-11 LAB — COMPREHENSIVE METABOLIC PANEL WITH GFR
ALT: 10 U/L (ref 0–53)
AST: 14 U/L (ref 0–37)
Albumin: 4.3 g/dL (ref 3.5–5.2)
Alkaline Phosphatase: 58 U/L (ref 39–117)
BUN: 23 mg/dL (ref 6–23)
CO2: 29 meq/L (ref 19–32)
Calcium: 9.7 mg/dL (ref 8.4–10.5)
Chloride: 102 meq/L (ref 96–112)
Creatinine, Ser: 1.59 mg/dL — ABNORMAL HIGH (ref 0.40–1.50)
GFR: 40.3 mL/min — ABNORMAL LOW (ref >60.00)
Glucose, Bld: 173 mg/dL — ABNORMAL HIGH (ref 70–99)
Potassium: 5.2 meq/L — ABNORMAL HIGH (ref 3.5–5.1)
Sodium: 139 meq/L (ref 135–145)
Total Bilirubin: 0.7 mg/dL (ref 0.2–1.2)
Total Protein: 6.8 g/dL (ref 6.0–8.3)

## 2023-12-11 LAB — HEMOGLOBIN A1C: Hgb A1c MFr Bld: 5.9 % (ref 4.6–6.5)

## 2023-12-11 LAB — TSH: TSH: 2.42 u[IU]/mL (ref 0.35–5.50)

## 2023-12-11 NOTE — Assessment & Plan Note (Signed)
Continue to monitor PSA

## 2023-12-11 NOTE — Assessment & Plan Note (Signed)
 On B12

## 2023-12-11 NOTE — Assessment & Plan Note (Signed)
 Rybelsus is working - increase to 14 mg/d On Farxiga

## 2023-12-11 NOTE — Assessment & Plan Note (Signed)
 On Simvastatin

## 2023-12-11 NOTE — Progress Notes (Signed)
 Subjective:  Patient ID: Larry [name], male    DOB: Feb 23, 1941  Age: 83 y.o. MRN: [id number]  CC: Medical Management of Chronic Issues (3 Month Follow Up)   HPI Larry [name] presents for DM, HTN, CAD, edema  Outpatient Medications Prior to Visit  Medication Sig Dispense Refill   Blood Glucose Monitoring Suppl (RELION CONFIRM GLUCOSE MONITOR) w/Device KIT Use as directed 1 kit 0   carvedilol  (COREG ) 12.5 MG tablet TAKE 1 TABLET BY MOUTH TWICE DAILY WITH A MEAL 180 tablet 0   Cholecalciferol  (VITAMIN D3) 1000 UNITS CAPS Take 1,000 Units by mouth daily.     Cyanocobalamin  (VITAMIN B-12) 1000 MCG SUBL Place 1 tablet (1,000 mcg total) under the tongue daily. 100 tablet 3   fluconazole  (DIFLUCAN ) 100 MG tablet Take 1 tablet by mouth once daily 14 tablet 0   fluorouracil (EFUDEX) 5 % cream Apply topically 2 (two) times daily.     folic acid  (FOLVITE ) 1 MG tablet Take 1 tablet by mouth once daily 90 tablet 3   furosemide  (LASIX ) 40 MG tablet Take 1 tablet by mouth once daily 90 tablet 3   glucose blood (RELION GLUCOSE TEST STRIPS) test strip 1 each by Other route as needed for other. Use to check blood sugars twice a day 100 each 11   hydrocortisone cream 1 % Apply 1 application topically 2 (two) times daily.     ketoconazole  (NIZORAL ) 2 % cream Apply 1 Application topically 2 (two) times daily. 45 g 1   linaclotide  (LINZESS ) 290 MCG CAPS capsule Take 1 capsule (290 mcg total) by mouth daily as needed. 30 capsule 5   loratadine  (CLARITIN ) 10 MG tablet TAKE 1 TABLET BY MOUTH ONCE DAILY FOR  1  MONTH  THEN  AS  NEEDED 100 tablet 0   metFORMIN  (GLUCOPHAGE ) 1000 MG tablet TAKE 1 TABLET BY MOUTH TWICE DAILY WITH A MEAL 180 tablet 0   Multiple Vitamins-Minerals (MACULAR VITAMIN BENEFIT) TABS Take 1 tablet by mouth 2 (two) times daily.     nitroGLYCERIN  (NITROSTAT ) 0.4 MG SL tablet Place 1 tablet (0.4 mg total) under the tongue every 5 (five) minutes as needed for chest pain. 20 tablet 3    PREVIDENT 5000 DRY MOUTH 1.1 % GEL dental gel      ReliOn Lancet Devices 30G MISC Use to check blood sugars twice a day 180 each 1   repaglinide  (PRANDIN ) 1 MG tablet TAKE 1 TABLET BY MOUTH THREE TIMES DAILY WITH MEALS 270 tablet 3   Semaglutide  (RYBELSUS ) 14 MG TABS Take 1 tablet (14 mg total) by mouth daily. Fax to [employer] Patient Assistance [phone]  Pt ID # [id number] 120 tablet 5   simvastatin  (ZOCOR ) 40 MG tablet TAKE 1 TABLET BY MOUTH ONCE DAILY IN THE EVENING . APPOINTMENT REQUIRED FOR FUTURE REFILLS 90 tablet 0   spironolactone  (ALDACTONE ) 25 MG tablet Take 0.5 tablets (12.5mg  total) by mouth daily 45 tablet 1   thiamine  100 MG tablet Take 1 tablet (100 mg total) by mouth daily. 90 tablet 3   hydrALAZINE  (APRESOLINE ) 25 MG tablet Take 0.5 tablets (12.5 mg total) by mouth 3 (three) times daily.     fluticasone  (FLONASE ) 50 MCG/ACT nasal spray Place 2 sprays into both nostrils daily. 1 g 0   No facility-administered medications prior to visit.    ROS: Review of Systems  Constitutional:  Positive for fatigue. Negative for appetite change and unexpected weight change.  HENT:  Negative for congestion, nosebleeds, sneezing,  sore throat and trouble swallowing.   Eyes:  Negative for itching and visual disturbance.  Respiratory:  Negative for cough.   Cardiovascular:  Positive for leg swelling. Negative for chest pain and palpitations.  Gastrointestinal:  Negative for abdominal distention, blood in stool, diarrhea and nausea.  Genitourinary:  Negative for frequency and hematuria.  Musculoskeletal:  Negative for back pain, gait problem, joint swelling and neck pain.  Skin:  Negative for rash.  Neurological:  Negative for dizziness, tremors, speech difficulty and weakness.  Psychiatric/Behavioral:  Negative for agitation, dysphoric mood and sleep disturbance. The patient is not nervous/anxious.     Objective:  BP (!) 108/54   Pulse 75   Temp (!) 97.5 F (36.4 C)   Ht 6'  (1.829 m)   Wt 220 lb 6.4 oz (100 kg)   SpO2 97%   BMI 29.89 kg/m   BP Readings from Last 3 Encounters:  12/11/23 (!) 108/54  11/21/23 (!) 132/50  11/04/23 124/64    Wt Readings from Last 3 Encounters:  12/11/23 220 lb 6.4 oz (100 kg)  11/21/23 224 lb 12.8 oz (102 kg)  11/04/23 223 lb 6.4 oz (101.3 kg)    Physical Exam Constitutional:      General: He is not in acute distress.    Appearance: He is well-developed. He is obese.     Comments: NAD  Eyes:     Conjunctiva/sclera: Conjunctivae normal.     Pupils: Pupils are equal, round, and reactive to light.  Neck:     Thyroid : No thyromegaly.     Vascular: No JVD.  Cardiovascular:     Rate and Rhythm: Normal rate and regular rhythm.     Heart sounds: Normal heart sounds. No murmur heard.    No friction rub. No gallop.  Pulmonary:     Effort: Pulmonary effort is normal. No respiratory distress.     Breath sounds: Normal breath sounds. No wheezing or rales.  Chest:     Chest wall: No tenderness.  Abdominal:     General: Bowel sounds are normal. There is no distension.     Palpations: Abdomen is soft. There is no mass.     Tenderness: There is no abdominal tenderness. There is no guarding or rebound.  Musculoskeletal:        General: No tenderness. Normal range of motion.     Cervical back: Normal range of motion.     Right lower leg: Edema present.     Left lower leg: Edema present.  Lymphadenopathy:     Cervical: No cervical adenopathy.  Skin:    General: Skin is warm and dry.     Findings: No rash.  Neurological:     Mental Status: He is alert and oriented to person, place, and time.     Cranial Nerves: No cranial nerve deficit.     Motor: No abnormal muscle tone.     Coordination: Coordination normal.     Gait: Gait normal.     Deep Tendon Reflexes: Reflexes are normal and symmetric.  Psychiatric:        Behavior: Behavior normal.        Thought Content: Thought content normal.        Judgment: Judgment  normal.   Edema B ankles - trace  Lab Results  Component Value Date   WBC 11.5 (H) 10/08/2023   HGB 13.4 10/08/2023   HCT 41.5 10/08/2023   PLT 218 10/08/2023   GLUCOSE 174 (H) 10/08/2023  CHOL 125 06/16/2020   TRIG 99.0 06/16/2020   HDL 41.10 06/16/2020   LDLCALC 64 06/16/2020   ALT 15 10/08/2023   AST 22 10/08/2023   NA 141 10/08/2023   K 4.5 10/08/2023   CL 103 10/08/2023   CREATININE 1.44 (H) 10/08/2023   BUN 28 (H) 10/08/2023   CO2 26 10/08/2023   TSH 2.84 09/12/2023   PSA 0.00 Repeated and verified X2. 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              Left CEA 08/22/2017 Performing Technologist: Parke Boll RVS, RCS  Examination Guidelines: A complete evaluation includes B-mode imaging, spectral Doppler, color Doppler, and power Doppler as needed of all accessible portions of each vessel. Bilateral testing is considered an integral part of a complete examination. Limited examinations for reoccurring indications may be performed as noted.  Right Carotid Findings: +----------+--------+--------+--------+------------------+--------+           PSV cm/sEDV cm/sStenosisPlaque DescriptionComments +----------+--------+--------+--------+------------------+--------+ CCA Prox  66      15                                         +----------+--------+--------+--------+------------------+--------+ CCA Mid   127     24                                          +----------+--------+--------+--------+------------------+--------+ CCA Distal96      26                                         +----------+--------+--------+--------+------------------+--------+ ICA Prox  174     45      40-59%                             +----------+--------+--------+--------+------------------+--------+ ICA Mid   141     37                                         +----------+--------+--------+--------+------------------+--------+ ICA Distal136     41                                         +----------+--------+--------+--------+------------------+--------+ ECA       96      26                                         +----------+--------+--------+--------+------------------+--------+ +----------+--------+-------+----------------+-------------------+  PSV cm/sEDV cmsDescribe        Arm Pressure (mmHG) +----------+--------+-------+----------------+-------------------+ [id number]            Multiphasic, WNL                    +----------+--------+-------+----------------+-------------------+ +---------+--------+--+--------+--+---------+ VertebralPSV cm/s56EDV cm/s14Antegrade +---------+--------+--+--------+--+---------+  Left Carotid Findings: +----------+--------+--------+--------+------------------+--------+           PSV cm/sEDV cm/sStenosisPlaque DescriptionComments +----------+--------+--------+--------+------------------+--------+ CCA Prox  110     17                                         +----------+--------+--------+--------+------------------+--------+ CCA Mid   169     24                                         +----------+--------+--------+--------+------------------+--------+ CCA Distal147     19                                         +----------+--------+--------+--------+------------------+--------+ ICA Prox  137     21      1-39%                               +----------+--------+--------+--------+------------------+--------+ ICA Mid   133     32                                         +----------+--------+--------+--------+------------------+--------+ ICA Distal121     26                                         +----------+--------+--------+--------+------------------+--------+ ECA       134                                                +----------+--------+--------+--------+------------------+--------+ +----------+--------+--------+----------------+-------------------+           PSV cm/sEDV cm/sDescribe        Arm Pressure (mmHG) +----------+--------+--------+----------------+-------------------+ [id number]             Multiphasic, WNL                    +----------+--------+--------+----------------+-------------------+ +---------+--------+--+--------+--+---------+ VertebralPSV cm/s98EDV cm/s28Antegrade +---------+--------+--+--------+--+---------+   Summary: Right Carotid: Velocities in the right ICA are consistent with a 40-59%                stenosis. Left Carotid: Velocities in the left ICA are consistent with a 1-39% stenosis. Vertebrals:  Bilateral vertebral arteries demonstrate antegrade flow. Subclavians: Normal flow hemodynamics were seen in bilateral subclavian              arteries. *See table(s) above for measurements and observations.  Electronically signed by Genny Kid MD on 11/04/2023 at 3:05:57 PM.    Final     Assessment & Plan:   Problem List Items Addressed This  Visit     DM2 (diabetes mellitus, type 2) (HCC) - Primary   Rybelsus  is working - increase to 14 mg/d On Farxiga       Relevant Orders   Comprehensive metabolic panel with GFR   Hemoglobin A1c   TSH   Dyslipidemia   On Simvastatin       Relevant Orders   TSH   Uncontrolled hypertension   Low BP - d/c Hydralazine       PROSTATE CANCER, HX OF   Continue to monitor PSA      Chronic renal insufficiency, stage 3 (moderate)  (HCC)   Hydrate well      Low serum vitamin B12   On B12         No orders of the defined types were placed in this encounter.     Follow-up: Return in about 3 months (around 03/11/2024) for a follow-up visit.  Anitra Barn, MD

## 2023-12-11 NOTE — Assessment & Plan Note (Signed)
 Hydrate well

## 2023-12-11 NOTE — Assessment & Plan Note (Signed)
 Low BP - d/c Hydralazine 

## 2023-12-12 ENCOUNTER — Ambulatory Visit: Payer: Self-pay | Admitting: *Deleted

## 2023-12-12 NOTE — Telephone Encounter (Signed)
  Chief Complaint: Patient's wife, Deanna(DPR) is calling to verify medication changes from yesterday's visit.  Medication changes: Low BP - d/c Hydralazine                                      Rybelsus  is working - increase to 14 mg/d  Disposition: [] ED /[] Urgent Care (no appt availability in office) / [] Appointment(In office/virtual)/ []  [facility]/ [] Home Care/ [] Refused Recommended Disposition /[] [facility]/ [x]  Follow-up with PCP Additional Notes: Notified of medication changes per chart - will continue to monitor BP.    Copied from CRM [id number]. Topic: Clinical - Medication Question >> Dec 12, 2023  2:50 PM Lonzell Robin C wrote: Reason for CRM: Patient's wife stated that Hydralazine  - 25mg  and it is keeping his BP low and wife is concerned about why patient has to take 3 pills a day. Patient was told to take half of the medication. Patient is also taking carvedilol  (COREG ) 12.5 MG tablet for BP and wife needs to know what patient needs to do. Patient is sleeping a lot. BP 110/54 Pulse was 76. Reason for Disposition . Caller has medicine question only, adult not sick, AND triager answers question  Answer Assessment - Initial Assessment Questions 1. NAME of MEDICINE: "What medicine(s) are you calling about?"     Patient's wife is concerned about patient's BP and medications.  2. QUESTION: "What is your question?" (e.g., double dose of medicine, side effect)     Hydralazine  and carvedilol   3. PRESCRIBER: "Who prescribed the medicine?" Reason: if prescribed by specialist, call should be referred to that group.     PCP 4. SYMPTOMS: "Do you have any symptoms?" If Yes, ask: "What symptoms are you having?"  "How bad are the symptoms (e.g., mild, moderate, severe)     BP this morning: 110/54 P 76  Per chart/visit yesterday- stop hydralazine . Patient will continue to monitor BP to see if it comes up a little more.  Protocols used: Medication Question Call-A-AH

## 2023-12-12 NOTE — Telephone Encounter (Signed)
 I have placed this on dr plotnikov desk for him to sign and give back to me to send over

## 2023-12-15 ENCOUNTER — Encounter: Payer: Self-pay | Admitting: Internal Medicine

## 2023-12-23 ENCOUNTER — Telehealth: Payer: Self-pay | Admitting: Internal Medicine

## 2023-12-23 NOTE — Telephone Encounter (Signed)
 Copied from CRM [id number]. Topic: Clinical - Prescription Issue >> Dec 23, 2023 11:25 AM Larry [name] wrote: Reason for CRM: pt called regarding paper work for Rebellus medication. States the pharmacy will be sending over documents today that needs to be signed by Dr Larry [name]. Pt is asking if he would please sign the needed document so he can get his medication. Please call pt back at [phone]

## 2023-12-23 NOTE — Telephone Encounter (Signed)
 Have not gotten paperwork as of yet. Soon we get faxed paperwork we will inform pt.

## 2023-12-31 DIAGNOSIS — S0502XA Injury of conjunctiva and corneal abrasion without foreign body, left eye, initial encounter: Secondary | ICD-10-CM | POA: Diagnosis not present

## 2024-01-02 DIAGNOSIS — S0502XA Injury of conjunctiva and corneal abrasion without foreign body, left eye, initial encounter: Secondary | ICD-10-CM | POA: Diagnosis not present

## 2024-01-27 ENCOUNTER — Telehealth: Payer: Self-pay | Admitting: Internal Medicine

## 2024-01-27 NOTE — Telephone Encounter (Signed)
 Pt called in to check on his Semaglutide  (RYBELSUS ) 14 MG TABS.  Let him know they are up front ready for him to pick up

## 2024-02-11 DIAGNOSIS — L570 Actinic keratosis: Secondary | ICD-10-CM | POA: Diagnosis not present

## 2024-02-11 DIAGNOSIS — C44612 Basal cell carcinoma of skin of right upper limb, including shoulder: Secondary | ICD-10-CM | POA: Diagnosis not present

## 2024-02-11 DIAGNOSIS — L821 Other seborrheic keratosis: Secondary | ICD-10-CM | POA: Diagnosis not present

## 2024-02-11 DIAGNOSIS — D485 Neoplasm of uncertain behavior of skin: Secondary | ICD-10-CM | POA: Diagnosis not present

## 2024-02-16 ENCOUNTER — Other Ambulatory Visit: Payer: Self-pay | Admitting: Cardiology

## 2024-02-19 ENCOUNTER — Ambulatory Visit

## 2024-02-19 VITALS — Ht 72.0 in | Wt 220.0 lb

## 2024-02-19 DIAGNOSIS — Z0000 Encounter for general adult medical examination without abnormal findings: Secondary | ICD-10-CM

## 2024-02-19 NOTE — Progress Notes (Cosign Needed Addendum)
 Subjective:   Larry [name] is a 83 y.o. who presents for a Medicare Wellness preventive visit.  As a reminder, Annual Wellness Visits don't include a physical exam, and some assessments may be limited, especially if this visit is performed virtually. We may recommend an in-person follow-up visit with your provider if needed.  Visit Complete: Virtual I connected with  Larry [name] on 02/19/24 by a audio enabled telemedicine application and verified that I am speaking with the correct person using two identifiers.  Patient Location: Home  Provider Location: Office/Clinic  I discussed the limitations of evaluation and management by telemedicine. The patient expressed understanding and agreed to proceed.  Vital Signs: Because this visit was a virtual/telehealth visit, some criteria may be missing or patient reported. Any vitals not documented were not able to be obtained and vitals that have been documented are patient reported.  VideoDeclined- This patient declined Librarian, academic. Therefore the visit was completed with audio only.  Persons Participating in Visit: Patient.  AWV Questionnaire: No: Patient Medicare AWV questionnaire was not completed prior to this visit.  Cardiac Risk Factors include: advanced age (>76men, >24 women);diabetes mellitus;dyslipidemia;male gender;hypertension;sedentary lifestyle     Objective:    Today's Vitals   02/19/24 1014  Weight: 220 lb (99.8 kg)  Height: 6' (1.829 m)   Body mass index is 29.84 kg/m.     02/19/2024   10:25 AM 10/08/2023   12:48 PM 05/18/2022    9:00 AM 01/03/2022    9:38 PM 10/18/2021   10:37 AM 05/13/2021    8:12 AM 09/18/2019   10:33 AM  Advanced Directives  Does Patient Have a Medical Advance Directive? No Yes Yes No No No Yes  Type of Special educational needs teacher of [place];Living will Healthcare Power of [place];Living will    Healthcare Power of [place];Living will  Does  patient want to make changes to medical advance directive?      Yes (ED - Information included in AVS)   Copy of Healthcare Power of Attorney in Chart?   No - copy requested    No - copy requested  Would patient like information on creating a medical advance directive?    No - Patient declined       Current Medications (verified) Outpatient Encounter Medications as of 02/19/2024  Medication Sig   Blood Glucose Monitoring Suppl (RELION CONFIRM GLUCOSE MONITOR) w/Device KIT Use as directed   carvedilol  (COREG ) 12.5 MG tablet TAKE 1 TABLET BY MOUTH TWICE DAILY WITH A MEAL   Cholecalciferol  (VITAMIN D3) 1000 UNITS CAPS Take 1,000 Units by mouth daily.   Cyanocobalamin  (VITAMIN B-12) 1000 MCG SUBL Place 1 tablet (1,000 mcg total) under the tongue daily.   fluorouracil (EFUDEX) 5 % cream Apply topically 2 (two) times daily.   fluticasone  (FLONASE ) 50 MCG/ACT nasal spray Place 2 sprays into both nostrils daily.   folic acid  (FOLVITE ) 1 MG tablet Take 1 tablet by mouth once daily   furosemide  (LASIX ) 40 MG tablet Take 1 tablet by mouth once daily   glucose blood (RELION GLUCOSE TEST STRIPS) test strip 1 each by Other route as needed for other. Use to check blood sugars twice a day   hydrocortisone cream 1 % Apply 1 application topically 2 (two) times daily.   ketoconazole  (NIZORAL ) 2 % cream Apply 1 Application topically 2 (two) times daily.   linaclotide  (LINZESS ) 290 MCG CAPS capsule Take 1 capsule (290 mcg total) by mouth daily as needed.  loratadine  (CLARITIN ) 10 MG tablet TAKE 1 TABLET BY MOUTH ONCE DAILY FOR  1  MONTH  THEN  AS  NEEDED   metFORMIN  (GLUCOPHAGE ) 1000 MG tablet TAKE 1 TABLET BY MOUTH TWICE DAILY WITH A MEAL   Multiple Vitamins-Minerals (MACULAR VITAMIN BENEFIT) TABS Take 1 tablet by mouth 2 (two) times daily.   nitroGLYCERIN  (NITROSTAT ) 0.4 MG SL tablet Place 1 tablet (0.4 mg total) under the tongue every 5 (five) minutes as needed for chest pain.   PREVIDENT 5000 DRY MOUTH 1.1 %  GEL dental gel    ReliOn Lancet Devices 30G MISC Use to check blood sugars twice a day   repaglinide  (PRANDIN ) 1 MG tablet TAKE 1 TABLET BY MOUTH THREE TIMES DAILY WITH MEALS   Semaglutide  (RYBELSUS ) 14 MG TABS Take 1 tablet (14 mg total) by mouth daily. Fax to [employer] Patient Assistance [phone]  Pt ID # [id number]   simvastatin  (ZOCOR ) 40 MG tablet TAKE 1 TABLET BY MOUTH ONCE DAILY IN THE EVENING . APPOINTMENT REQUIRED FOR FUTURE REFILLS   spironolactone  (ALDACTONE ) 25 MG tablet TAKE 1/2 (ONE-HALF) TABLET BY MOUTH ONCE DAILY .   thiamine  100 MG tablet Take 1 tablet (100 mg total) by mouth daily.   fluconazole  (DIFLUCAN ) 100 MG tablet Take 1 tablet by mouth once daily (Patient not taking: Reported on 02/19/2024)   No facility-administered encounter medications on file as of 02/19/2024.    Allergies (verified) Amlodipine , Verapamil , Clindamycin hcl, Farxiga  [dapagliflozin ], and Contrast media [iodinated contrast media]   History: Past Medical History:  Diagnosis Date   Carotid stenosis, left    Cataracts, bilateral    removed   Coronary artery disease    Diabetes (HCC)    Heart valve problem    Hyperlipemia    Prostate CA (HCC)    Sleep apnea    Dr Ronelle ; uses CPAP   Stroke (HCC)    Tubular adenoma of colon 02/2016   Type II or unspecified type diabetes mellitus without mention of complication, not stated as uncontrolled    Unspecified essential hypertension    Past Surgical History:  Procedure Laterality Date   APPENDECTOMY     CARDIAC CATHETERIZATION     CATARACT EXTRACTION, BILATERAL     COLONOSCOPY     CORONARY ARTERY BYPASS GRAFT N/A 06/27/2017   Procedure: CORONARY ARTERY BYPASS GRAFTING (CABG), time four   using the left internal mammary artery, and right saphenous leg vein harvested endoscopically.;  Surgeon: Fleeta Hanford Coy, MD;  Location: [facility] OR;  Service: Open Heart Surgery;  Laterality: N/A;   CORONARY ULTRASOUND/IVUS N/A 06/24/2017   Procedure:  Intravascular Ultrasound/IVUS;  Surgeon: Claudene Victory ORN, MD;  Location: [facility] INVASIVE CV LAB;  Service: Cardiovascular;  Laterality: N/A;  LEFT MAIN   CYSTOSCOPY N/A 06/27/2017   Procedure: CYSTOSCOPY FLEXIBLE, Balloon dilatation placement of foley catheter;  Surgeon: Fleeta Hanford Coy, MD;  Location: [facility] OR;  Service: Open Heart Surgery;  Laterality: N/A;   ENDARTERECTOMY Left 08/22/2017   Procedure: ENDARTERECTOMY CAROTID LEFT;  Surgeon: Serene Gaile ORN, MD;  Location: [facility] OR;  Service: Vascular;  Laterality: Left;   INGUINAL HERNIA REPAIR Left    LEFT HEART CATH AND CORONARY ANGIOGRAPHY N/A 06/24/2017   Procedure: LEFT HEART CATH AND CORONARY ANGIOGRAPHY;  Surgeon: Claudene Victory ORN, MD;  Location: [facility] INVASIVE CV LAB;  Service: Cardiovascular;  Laterality: N/A;   PATCH ANGIOPLASTY Left 08/22/2017   Procedure: PATCH ANGIOPLASTY USING GEORGE BIOLOGIC PATCH;  Surgeon: Serene Gaile ORN, MD;  Location:  [facility] OR;  Service: Vascular;  Laterality: Left;   PROSTATECTOMY     TEE WITHOUT CARDIOVERSION N/A 06/27/2017   Procedure: TRANSESOPHAGEAL ECHOCARDIOGRAM (TEE);  Surgeon: Fleeta Ochoa, Maude, MD;  Location: [facility] OR;  Service: Open Heart Surgery;  Laterality: N/A;   TONSILLECTOMY     ULTRASOUND GUIDANCE FOR VASCULAR ACCESS  06/24/2017   Procedure: Ultrasound Guidance For Vascular Access;  Surgeon: Claudene Victory ORN, MD;  Location: [facility] INVASIVE CV LAB;  Service: Cardiovascular;;   Family History  Problem Relation Age of Onset   Heart disease Mother    Heart disease Father    Heart attack Brother    Diabetes Other    Hypertension Other    Colon cancer Neg Hx    Esophageal cancer Neg Hx    Rectal cancer Neg Hx    Stomach cancer Neg Hx    Social History   Socioeconomic History   Marital status: Married    Spouse name: Not on file   Number of children: 4   Years of education: Not on file   Highest education level: Bachelor's degree (e.g., BA, AB, BS)  Occupational History   Occupation: Salesman     Comment: retired  Tobacco Use   Smoking status: Never   Smokeless tobacco: Never  Vaping Use   Vaping status: Never Used  Substance and Sexual Activity   Alcohol use: No    Alcohol/week: 0.0 standard drinks of alcohol   Drug use: No   Sexual activity: Not on file  Other Topics Concern   Not on file  Social History Narrative   Regular Exercise- no, golf   Social Drivers of Health   Financial Resource Strain: Low Risk  (02/19/2024)   Overall Financial Resource Strain (CARDIA)    Difficulty of Paying Living Expenses: Not hard at all  Food Insecurity: No Food Insecurity (02/19/2024)   Hunger Vital Sign    Worried About Running Out of Food in the Last Year: Never true    Ran Out of Food in the Last Year: Never true  Transportation Needs: No Transportation Needs (02/19/2024)   PRAPARE - Administrator, Civil Service (Medical): No    Lack of Transportation (Non-Medical): No  Physical Activity: Inactive (02/19/2024)   Exercise Vital Sign    Days of Exercise per Week: 0 days    Minutes of Exercise per Session: 0 min  Stress: No Stress Concern Present (02/19/2024)   [employer] of Occupational Health - Occupational Stress Questionnaire    Feeling of Stress: Not at all  Social Connections: Socially Integrated (02/19/2024)   Social Connection and Isolation Panel    Frequency of Communication with Friends and Family: More than three times a week    Frequency of Social Gatherings with Friends and Family: More than three times a week    Attends Religious Services: More than 4 times per year    Active Member of [employer] or Organizations: Yes    Attends Engineer, structural: More than 4 times per year    Marital Status: Married    Tobacco Counseling Counseling given: Not Answered  Clinical Intake:  Pre-visit preparation completed: Yes  Pain : No/denies pain   BMI - recorded: 29.84 Nutritional Status: BMI 25 -29 Overweight Nutritional Risks: None Diabetes: Yes CBG  done?: No Did pt. bring in CBG monitor from home?: No  Lab Results  Component Value Date   HGBA1C 5.9 12/11/2023   HGBA1C 6.1 09/12/2023   HGBA1C 7.1 (H) 03/12/2023  How often do you need to have someone help you when you read instructions, pamphlets, or other written materials from your doctor or pharmacy?: 1 - Never  Interpreter Needed?: No  Comments: lives with wife Information entered by :: B.Ronnald Shedden,LPN   Activities of Daily Living     02/19/2024   10:26 AM  In your present state of health, do you have any difficulty performing the following activities:  Hearing? 1  Vision? 0  Difficulty concentrating or making decisions? 0  Walking or climbing stairs? 0  Dressing or bathing? 0  Doing errands, shopping? 0  Preparing Food and eating ? N  Using the Toilet? N  In the past six months, have you accidently leaked urine? N  Do you have problems with loss of bowel control? N  Managing your Medications? N  Managing your Finances? N  Housekeeping or managing your Housekeeping? N    Patient Care Team: Plotnikov, Karlynn GAILS, MD as PCP - General (Internal Medicine) Shlomo Wilbert SAUNDERS, MD as PCP - Sleep Medicine (Cardiology) Jeffrie Oneil BROCKS, MD as PCP - Cardiology (Cardiology) Ottelin, Mark, MD (Inactive) as Consulting Physician (Urology) Colon Shove, MD as Consulting Physician (Neurosurgery) Szabat, Toribio BROCKS, [facility] (Inactive) as Pharmacist (Pharmacist) Cleatus Collar, MD as Consulting Physician (Ophthalmology)  I have updated your Care Teams any recent Medical Services you may have received from other providers in the past year.     Assessment:   This is a routine wellness examination for Hodari.  Hearing/Vision screen Hearing Screening - Comments:: Pt says his hearing is little less: will get a hearing test soon/ahead Vision Screening - Comments:: Pt says his vision is good w/glasses Dr MARLA.Hecker   Goals Addressed             This Visit's Progress    Exercise 3x  per week (30 min per time)   Not on track    walking     Monitor and Manage My Blood Sugar-Diabetes Type 2   On track    Timeframe:  Long-Range Goal Priority:  High Start Date:   05/11/2021                          Expected End Date:  07/11/2022                     Follow Up Date March 2023   - check blood sugar at prescribed times - check blood sugar if I feel it is too high or too low - enter blood sugar readings and medication or insulin  into daily log - take the blood sugar log to all doctor visits - take the blood sugar meter to all doctor visits    Why is this important?   Checking your blood sugar at home helps to keep it from getting very high or very low.  Writing the results in a diary or log helps the doctor know how to care for you.  Your blood sugar log should have the time, date and the results.  Also, write down the amount of insulin  or other medicine that you take.  Other information, like what you ate, exercise done and how you were feeling, will also be helpful.         Depression Screen     02/19/2024   10:23 AM 09/12/2023    9:25 AM 06/12/2023   10:12 AM 03/12/2023   10:35 AM 12/11/2022   10:40 AM 09/13/2022  10:32 AM 06/14/2022   11:06 AM  PHQ 2/9 Scores  PHQ - 2 Score 0 0 0 0 0 0 0  PHQ- 9 Score     0  2    Fall Risk     02/19/2024   10:18 AM 09/12/2023    9:25 AM 06/12/2023   10:12 AM 03/12/2023   10:35 AM 12/11/2022   10:40 AM  Fall Risk   Falls in the past year? 0 0 0 0 0  Number falls in past yr: 0 0 0 0 0  Injury with Fall? 0 0 0 0 0  Risk for fall due to : No Fall Risks No Fall Risks No Fall Risks No Fall Risks   Follow up Education provided;Falls prevention discussed Falls evaluation completed Falls evaluation completed Falls evaluation completed Falls evaluation completed    MEDICARE RISK AT HOME:  Medicare Risk at Home Any stairs in or around the home?: Yes If so, are there any without handrails?: Yes Home free of loose throw rugs in  walkways, pet beds, electrical cords, etc?: Yes Adequate lighting in your home to reduce risk of falls?: Yes Life alert?: No Use of a cane, walker or w/c?: No Grab bars in the bathroom?: Yes Shower chair or bench in shower?: Yes Elevated toilet seat or a handicapped toilet?: No  TIMED UP AND GO:  Was the test performed?  No  Cognitive Function: 6CIT completed        02/19/2024   10:27 AM 05/18/2022    9:01 AM 05/13/2021    8:13 AM  6CIT Screen  What Year? 0 points 0 points 0 points  What month? 0 points 0 points 0 points  What time? 0 points 0 points 0 points  Count back from 20 0 points 0 points 0 points  Months in reverse 0 points 0 points 0 points  Repeat phrase 0 points 2 points 0 points  Total Score 0 points 2 points 0 points    Immunizations Immunization History  Administered Date(s) Administered   Fluad Quad(high Dose 65+) 06/24/2019, 06/15/2020, 05/22/2021, 06/14/2022   Fluad Trivalent(High Dose 65+) 06/12/2023   Influenza Split 07/13/2011   Influenza Whole 05/30/2006, 05/27/2008, 07/19/2009, 08/24/2010   Influenza, High Dose Seasonal PF 08/07/2016, 06/18/2017, 06/25/2018   Influenza, Seasonal, Injecte, Preservative Fre 08/27/2012   Influenza,inj,Quad PF,6+ Mos 04/29/2013, 04/07/2014   Pneumococcal Conjugate-13 07/13/2019   Pneumococcal Polysaccharide-23 07/13/2011   Tdap 09/10/2019    Screening Tests Health Maintenance  Topic Date Due   Diabetic kidney evaluation - Urine ACR  Never done   Zoster Vaccines- Shingrix (1 of 2) Never done   FOOT EXAM  01/20/2022   INFLUENZA VACCINE  03/20/2024   HEMOGLOBIN A1C  06/11/2024   OPHTHALMOLOGY EXAM  11/12/2024   Diabetic kidney evaluation - eGFR measurement  12/10/2024   Medicare Annual Wellness (AWV)  02/18/2025   DTaP/Tdap/Td (2 - Td or Tdap) 09/09/2029   Pneumococcal Vaccine: 50+ Years  Completed   Hepatitis B Vaccines  Aged Out   HPV VACCINES  Aged Out   Meningococcal B Vaccine  Aged Out   Colonoscopy   Discontinued   COVID-19 Vaccine  Discontinued    Health Maintenance  Health Maintenance Due  Topic Date Due   Diabetic kidney evaluation - Urine ACR  Never done   Zoster Vaccines- Shingrix (1 of 2) Never done   FOOT EXAM  01/20/2022   Health Maintenance Items Addressed: None at this time  Additional Screening:  Vision Screening:  Recommended annual ophthalmology exams for early detection of glaucoma and other disorders of the eye. Would you like a referral to an eye doctor? No    Dental Screening: Recommended annual dental exams for proper oral hygiene  Community Resource Referral / Chronic Care Management: CRR required this visit?  No   CCM required this visit?  No   Plan:    I have personally reviewed and noted the following in the patient's chart:   Medical and social history Use of alcohol, tobacco or illicit drugs  Current medications and supplements including opioid prescriptions. Patient is not currently taking opioid prescriptions. Functional ability and status Nutritional status Physical activity Advanced directives List of other physicians Hospitalizations, surgeries, and ER visits in previous 12 months Vitals Screenings to include cognitive, depression, and falls Referrals and appointments  In addition, I have reviewed and discussed with patient certain preventive protocols, quality metrics, and best practice recommendations. A written personalized care plan for preventive services as well as general preventive health recommendations were provided to patient.   Erminio LITTIE Saris, LPN   09/26/7972   After Visit Summary: (MyChart) Due to this being a telephonic visit, the after visit summary with patients personalized plan was offered to patient via MyChart   Notes: Nothing significant to report at this time.  Medical screening examination/treatment/procedure(s) were performed by non-physician practitioner and as supervising physician I was immediately  available for consultation/collaboration.  I agree with above. Karlynn Noel, MD

## 2024-02-19 NOTE — Patient Instructions (Signed)
 [name] , Thank you for taking time out of your busy schedule to complete your Annual Wellness Visit with me. I enjoyed our conversation and look forward to speaking with you again next year. I, as well as your care team,  appreciate your ongoing commitment to your health goals. Please review the following plan we discussed and let me know if I can assist you in the future. Your Game plan/ To Do List    Follow up Visits: Next Medicare AWV with our clinical staff: 02/23/25 @ 10:10am televisit   Have you seen your provider in the last 6 months (3 months if uncontrolled diabetes)? Yes Next Office Visit with your provider: 03/11/24 @ 10:10am  Clinician Recommendations:  Aim for 30 minutes of exercise or brisk walking, 6-8 glasses of water, and 5 servings of fruits and vegetables each day.       This is a list of the screening recommended for you and due dates:  Health Maintenance  Topic Date Due   Yearly kidney health urinalysis for diabetes  Never done   Zoster (Shingles) Vaccine (1 of 2) Never done   Complete foot exam   01/20/2022   Flu Shot  03/20/2024   Hemoglobin A1C  06/11/2024   Eye exam for diabetics  11/12/2024   Yearly kidney function blood test for diabetes  12/10/2024   Medicare Annual Wellness Visit  02/18/2025   DTaP/Tdap/Td vaccine (2 - Td or Tdap) 09/09/2029   Pneumococcal Vaccine for age over 82  Completed   Hepatitis B Vaccine  Aged Out   HPV Vaccine  Aged Out   Meningitis B Vaccine  Aged Out   Colon Cancer Screening  Discontinued   COVID-19 Vaccine  Discontinued    Advanced directives: (Declined) Advance directive discussed with you today. Even though you declined this today, please call our office should you change your mind, and we can give you the proper paperwork for you to fill out. Advance Care Planning is important because it:  [x]  Makes sure you receive the medical care that is consistent with your values, goals, and preferences  [x]  It provides guidance  to your family and loved ones and reduces their decisional burden about whether or not they are making the right decisions based on your wishes.  Follow the link provided in your after visit summary or read over the paperwork we have mailed to you to help you started getting your Advance Directives in place. If you need assistance in completing these, please reach out to us  so that we can help you!

## 2024-02-26 ENCOUNTER — Other Ambulatory Visit: Payer: Self-pay | Admitting: Internal Medicine

## 2024-02-26 DIAGNOSIS — C44612 Basal cell carcinoma of skin of right upper limb, including shoulder: Secondary | ICD-10-CM | POA: Diagnosis not present

## 2024-02-26 DIAGNOSIS — L570 Actinic keratosis: Secondary | ICD-10-CM | POA: Diagnosis not present

## 2024-03-11 ENCOUNTER — Ambulatory Visit (INDEPENDENT_AMBULATORY_CARE_PROVIDER_SITE_OTHER)

## 2024-03-11 ENCOUNTER — Encounter: Payer: Self-pay | Admitting: Internal Medicine

## 2024-03-11 ENCOUNTER — Ambulatory Visit: Admitting: Internal Medicine

## 2024-03-11 VITALS — BP 130/72 | HR 78 | Temp 97.7°F | Ht 72.0 in | Wt 215.4 lb

## 2024-03-11 DIAGNOSIS — E1159 Type 2 diabetes mellitus with other circulatory complications: Secondary | ICD-10-CM

## 2024-03-11 DIAGNOSIS — R197 Diarrhea, unspecified: Secondary | ICD-10-CM

## 2024-03-11 DIAGNOSIS — K5901 Slow transit constipation: Secondary | ICD-10-CM

## 2024-03-11 DIAGNOSIS — E662 Morbid (severe) obesity with alveolar hypoventilation: Secondary | ICD-10-CM | POA: Diagnosis not present

## 2024-03-11 DIAGNOSIS — Z7984 Long term (current) use of oral hypoglycemic drugs: Secondary | ICD-10-CM

## 2024-03-11 DIAGNOSIS — I25709 Atherosclerosis of coronary artery bypass graft(s), unspecified, with unspecified angina pectoris: Secondary | ICD-10-CM | POA: Diagnosis not present

## 2024-03-11 DIAGNOSIS — K5900 Constipation, unspecified: Secondary | ICD-10-CM | POA: Diagnosis not present

## 2024-03-11 NOTE — Assessment & Plan Note (Signed)
 No stool x 4-5 days, then he gets diarrhea and he was incontinent x 4 times x 6 weeks. No hard stools Last BM - last night (incontinent) D/c Rybelsus  Use a probiotic Abd X ray

## 2024-03-11 NOTE — Assessment & Plan Note (Signed)
 Wt Readings from Last 3 Encounters:  03/11/24 215 lb 6.4 oz (97.7 kg)  02/19/24 220 lb (99.8 kg)  12/11/23 220 lb 6.4 oz (100 kg)

## 2024-03-11 NOTE — Assessment & Plan Note (Signed)
 Simvastatin , Coreg  No CP On baby ASA

## 2024-03-11 NOTE — Patient Instructions (Signed)
 Stop Rybelsus  for 2-3 weeks May restart Rybelsus  at 1/2 tab a day or every other day in 2-3 weeks if ok

## 2024-03-11 NOTE — Assessment & Plan Note (Signed)
 No stool x 4-5 days, then he gets diarrhea and he was incontinent x 4 times x 6 weeks. No hard stools Last BM - last night (incontinent) D/c Rybelsus  Use a probiotic

## 2024-03-11 NOTE — Progress Notes (Signed)
 Subjective:  Patient ID: Larry [name], male    DOB: 09-08-1940  Age: 83 y.o. MRN: [id number]  CC: Follow-up (Constipation, can go 4-5 days without using the bathroom. Has intermittent diarrhea and at times cannot control it. Denies stomach pains)   HPI Larry [name] presents for a c/o no stool x 4-5 days, then he gets diarrhea and he was incontinent x 4 times x 6 weeks. No hard stools Last BM - last night (incontinent) F/u on DM Doyal is 36, has arthritis  Outpatient Medications Prior to Visit  Medication Sig Dispense Refill   Blood Glucose Monitoring Suppl (RELION CONFIRM GLUCOSE MONITOR) w/Device KIT Use as directed 1 kit 0   carvedilol  (COREG ) 12.5 MG tablet TAKE 1 TABLET BY MOUTH TWICE DAILY WITH A MEAL 180 tablet 0   Cholecalciferol  (VITAMIN D3) 1000 UNITS CAPS Take 1,000 Units by mouth daily.     Cyanocobalamin  (VITAMIN B-12) 1000 MCG SUBL Place 1 tablet (1,000 mcg total) under the tongue daily. 100 tablet 3   fluconazole  (DIFLUCAN ) 100 MG tablet Take 1 tablet by mouth once daily 14 tablet 0   fluorouracil (EFUDEX) 5 % cream Apply topically 2 (two) times daily.     fluticasone  (FLONASE ) 50 MCG/ACT nasal spray Place 2 sprays into both nostrils daily. 1 g 0   folic acid  (FOLVITE ) 1 MG tablet Take 1 tablet by mouth once daily 90 tablet 3   furosemide  (LASIX ) 40 MG tablet Take 1 tablet by mouth once daily 90 tablet 3   glucose blood (RELION GLUCOSE TEST STRIPS) test strip 1 each by Other route as needed for other. Use to check blood sugars twice a day 100 each 11   hydrocortisone cream 1 % Apply 1 application topically 2 (two) times daily.     ketoconazole  (NIZORAL ) 2 % cream Apply 1 Application topically 2 (two) times daily. 45 g 1   linaclotide  (LINZESS ) 290 MCG CAPS capsule Take 1 capsule (290 mcg total) by mouth daily as needed. 30 capsule 5   loratadine  (CLARITIN ) 10 MG tablet TAKE 1 TABLET BY MOUTH ONCE DAILY FOR  1  MONTH  THEN  AS  NEEDED 100 tablet 0   metFORMIN   (GLUCOPHAGE ) 1000 MG tablet TAKE 1 TABLET BY MOUTH TWICE DAILY WITH A MEAL 180 tablet 0   Multiple Vitamins-Minerals (MACULAR VITAMIN BENEFIT) TABS Take 1 tablet by mouth 2 (two) times daily.     nitroGLYCERIN  (NITROSTAT ) 0.4 MG SL tablet Place 1 tablet (0.4 mg total) under the tongue every 5 (five) minutes as needed for chest pain. 20 tablet 3   PREVIDENT 5000 DRY MOUTH 1.1 % GEL dental gel      ReliOn Lancet Devices 30G MISC Use to check blood sugars twice a day 180 each 1   repaglinide  (PRANDIN ) 1 MG tablet TAKE 1 TABLET BY MOUTH THREE TIMES DAILY WITH MEALS 270 tablet 3   Semaglutide  (RYBELSUS ) 14 MG TABS Take 1 tablet (14 mg total) by mouth daily. Fax to [employer] [facility] [phone]  Pt ID # [id number] 120 tablet 5   simvastatin  (ZOCOR ) 40 MG tablet TAKE 1 TABLET BY MOUTH ONCE DAILY IN THE EVENING . APPOINTMENT REQUIRED FOR FUTURE REFILLS 90 tablet 0   spironolactone  (ALDACTONE ) 25 MG tablet TAKE 1/2 (ONE-HALF) TABLET BY MOUTH ONCE DAILY . 45 tablet 3   thiamine  100 MG tablet Take 1 tablet (100 mg total) by mouth daily. 90 tablet 3   No facility-administered medications prior to visit.  ROS: Review of Systems  Constitutional:  Negative for appetite change, fatigue and unexpected weight change.  HENT:  Negative for congestion, nosebleeds, sneezing, sore throat and trouble swallowing.   Eyes:  Negative for itching and visual disturbance.  Respiratory:  Negative for cough.   Cardiovascular:  Negative for chest pain, palpitations and leg swelling.  Gastrointestinal:  Positive for constipation and diarrhea. Negative for abdominal distention, abdominal pain, anal bleeding, blood in stool, nausea, rectal pain and vomiting.  Genitourinary:  Negative for frequency and hematuria.  Musculoskeletal:  Negative for back pain, gait problem, joint swelling and neck pain.  Skin:  Negative for rash.  Neurological:  Negative for dizziness, tremors, speech difficulty and weakness.   Hematological:  Bruises/bleeds easily.  Psychiatric/Behavioral:  Negative for agitation, dysphoric mood and sleep disturbance. The patient is not nervous/anxious.     Objective:  BP 130/72 (BP Location: Left Arm, Patient Position: Sitting)   Pulse 78   Temp 97.7 F (36.5 C) (Temporal)   Ht 6' (1.829 m)   Wt 215 lb 6.4 oz (97.7 kg)   SpO2 97%   BMI 29.21 kg/m   BP Readings from Last 3 Encounters:  03/11/24 130/72  12/11/23 (!) 108/54  11/21/23 (!) 132/50    Wt Readings from Last 3 Encounters:  03/11/24 215 lb 6.4 oz (97.7 kg)  02/19/24 220 lb (99.8 kg)  12/11/23 220 lb 6.4 oz (100 kg)    Physical Exam Constitutional:      General: He is not in acute distress.    Appearance: He is well-developed. He is obese.     Comments: NAD  Eyes:     Conjunctiva/sclera: Conjunctivae normal.     Pupils: Pupils are equal, round, and reactive to light.  Neck:     Thyroid : No thyromegaly.     Vascular: No JVD.  Cardiovascular:     Rate and Rhythm: Normal rate and regular rhythm.     Heart sounds: Normal heart sounds. No murmur heard.    No friction rub. No gallop.  Pulmonary:     Effort: Pulmonary effort is normal. No respiratory distress.     Breath sounds: Normal breath sounds. No wheezing or rales.  Chest:     Chest wall: No tenderness.  Abdominal:     General: Bowel sounds are normal. There is no distension.     Palpations: Abdomen is soft. There is no mass.     Tenderness: There is no abdominal tenderness. There is no guarding or rebound.  Genitourinary:    Rectum: Normal. Guaiac result negative.  Musculoskeletal:        General: No tenderness. Normal range of motion.     Cervical back: Normal range of motion.  Lymphadenopathy:     Cervical: No cervical adenopathy.  Skin:    General: Skin is warm and dry.     Findings: No rash.  Neurological:     Mental Status: He is alert and oriented to person, place, and time.     Cranial Nerves: No cranial nerve deficit.      Motor: No abnormal muscle tone.     Coordination: Coordination normal.     Gait: Gait normal.     Deep Tendon Reflexes: Reflexes are normal and symmetric.  Psychiatric:        Behavior: Behavior normal.        Thought Content: Thought content normal.        Judgment: Judgment normal.   Rectal exam: No rectal mass, small soft  brown stool on the glove   Lab Results  Component Value Date   WBC 11.5 (H) 10/08/2023   HGB 13.4 10/08/2023   HCT 41.5 10/08/2023   PLT 218 10/08/2023   GLUCOSE 173 (H) 12/11/2023   CHOL 125 06/16/2020   TRIG 99.0 06/16/2020   HDL 41.10 06/16/2020   LDLCALC 64 06/16/2020   ALT 10 12/11/2023   AST 14 12/11/2023   NA 139 12/11/2023   K 5.2 No hemolysis seen (H) 12/11/2023   CL 102 12/11/2023   CREATININE 1.59 (H) 12/11/2023   BUN 23 12/11/2023   CO2 29 12/11/2023   TSH 2.42 12/11/2023   PSA 0.00 Repeated and verified X2. (L) 03/12/2023   INR 1.0 10/18/2021   HGBA1C 5.9 12/11/2023    VAS US  CAROTID Result Date: 11/04/2023 Carotid Arterial Duplex Study Patient Name:  OSHAY STRANAHAN  Date of Exam:   11/04/2023 Medical Rec #: [id number]        Accession #:    [id number] Date of Birth: [birth year]        Patient Gender: M Patient Age:   33 years Exam Location:  [facility] Procedure:      VAS US  CAROTID Referring Phys: GAILE NEW --------------------------------------------------------------------------------  Indications: Carotid artery disease and left endarterectomy.              Left CEA 08/22/2017 Performing Technologist: Geni Lodge RVS, RCS  Examination Guidelines: A complete evaluation includes B-mode imaging, spectral Doppler, color Doppler, and power Doppler as needed of all accessible portions of each vessel. Bilateral testing is considered an integral part of a complete examination. Limited examinations for reoccurring indications may be performed as noted.  Right Carotid Findings:  +----------+--------+--------+--------+------------------+--------+           PSV cm/sEDV cm/sStenosisPlaque DescriptionComments +----------+--------+--------+--------+------------------+--------+ CCA Prox  66      15                                         +----------+--------+--------+--------+------------------+--------+ CCA Mid   127     24                                         +----------+--------+--------+--------+------------------+--------+ CCA Distal96      26                                         +----------+--------+--------+--------+------------------+--------+ ICA Prox  174     45      40-59%                             +----------+--------+--------+--------+------------------+--------+ ICA Mid   141     37                                         +----------+--------+--------+--------+------------------+--------+ ICA Distal136     41                                         +----------+--------+--------+--------+------------------+--------+  ECA       96      26                                         +----------+--------+--------+--------+------------------+--------+ +----------+--------+-------+----------------+-------------------+           PSV cm/sEDV cmsDescribe        Arm Pressure (mmHG) +----------+--------+-------+----------------+-------------------+ [id number]            Multiphasic, WNL                    +----------+--------+-------+----------------+-------------------+ +---------+--------+--+--------+--+---------+ VertebralPSV cm/s56EDV cm/s14Antegrade +---------+--------+--+--------+--+---------+  Left Carotid Findings: +----------+--------+--------+--------+------------------+--------+           PSV cm/sEDV cm/sStenosisPlaque DescriptionComments +----------+--------+--------+--------+------------------+--------+ CCA Prox  110     17                                          +----------+--------+--------+--------+------------------+--------+ CCA Mid   169     24                                         +----------+--------+--------+--------+------------------+--------+ CCA Distal147     19                                         +----------+--------+--------+--------+------------------+--------+ ICA Prox  137     21      1-39%                              +----------+--------+--------+--------+------------------+--------+ ICA Mid   133     32                                         +----------+--------+--------+--------+------------------+--------+ ICA Distal121     26                                         +----------+--------+--------+--------+------------------+--------+ ECA       134                                                +----------+--------+--------+--------+------------------+--------+ +----------+--------+--------+----------------+-------------------+           PSV cm/sEDV cm/sDescribe        Arm Pressure (mmHG) +----------+--------+--------+----------------+-------------------+ [id number]             Multiphasic, WNL                    +----------+--------+--------+----------------+-------------------+ +---------+--------+--+--------+--+---------+ VertebralPSV cm/s98EDV cm/s28Antegrade +---------+--------+--+--------+--+---------+   Summary: Right Carotid: Velocities in the right ICA are consistent with a 40-59%                stenosis. Left Carotid: Velocities in the left ICA are consistent with a 1-39% stenosis. Vertebrals:  Bilateral vertebral arteries demonstrate antegrade flow. Subclavians: Normal flow hemodynamics were seen in bilateral subclavian              arteries. *See table(s) above for measurements and observations.  Electronically signed by Gaile New MD on 11/04/2023 at 3:05:57 PM.    Final     Assessment & Plan:   Problem List Items Addressed This Visit     Constipation - Primary    No stool x 4-5 days, then he gets diarrhea and he was incontinent x 4 times x 6 weeks. No hard stools Last BM - last night (incontinent) D/c Rybelsus  Use a probiotic      Relevant Orders   DG Abd 2 Views   Coronary artery disease involving coronary bypass graft of native heart with angina pectoris (HCC)   Simvastatin , Coreg  No CP On baby ASA      Diarrhea   No stool x 4-5 days, then he gets diarrhea and he was incontinent x 4 times x 6 weeks. No hard stools Last BM - last night (incontinent) D/c Rybelsus  Use a probiotic Abd X ray      Relevant Orders   DG Abd 2 Views   DM2 (diabetes mellitus, type 2) (HCC)    D/c Rybelsus  due to c/o no stool x 4-5 days, then he gets diarrhea and he was incontinent x 4 times x 6 weeks. No hard stools.  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     Follow-up: Return in about 6 weeks (around 04/22/2024) for a follow-up visit.  Marolyn Noel, MD

## 2024-03-11 NOTE — Assessment & Plan Note (Addendum)
  D/c Rybelsus  due to c/o no stool x 4-5 days, then he gets diarrhea and he was incontinent x 4 times x 6 weeks. No hard stools.  Stop Rybelsus  for 2-3 weeks May restart Rybelsus  at 1/2 tab a day or every other day in 2-3 weeks if ok

## 2024-03-12 ENCOUNTER — Ambulatory Visit: Payer: Self-pay | Admitting: Internal Medicine

## 2024-04-29 ENCOUNTER — Encounter: Payer: Self-pay | Admitting: Internal Medicine

## 2024-04-29 ENCOUNTER — Ambulatory Visit: Admitting: Internal Medicine

## 2024-04-29 VITALS — BP 136/78 | HR 74 | Temp 97.7°F | Ht 72.0 in | Wt 228.4 lb

## 2024-04-29 DIAGNOSIS — R600 Localized edema: Secondary | ICD-10-CM | POA: Diagnosis not present

## 2024-04-29 DIAGNOSIS — R5383 Other fatigue: Secondary | ICD-10-CM

## 2024-04-29 DIAGNOSIS — E1159 Type 2 diabetes mellitus with other circulatory complications: Secondary | ICD-10-CM

## 2024-04-29 DIAGNOSIS — R296 Repeated falls: Secondary | ICD-10-CM | POA: Diagnosis not present

## 2024-04-29 MED ORDER — FUROSEMIDE 40 MG PO TABS: ORAL_TABLET | ORAL | 3 refills | Status: DC

## 2024-04-29 MED ORDER — TORSEMIDE 20 MG PO TABS: 20.0000 mg | ORAL_TABLET | Freq: Every day | ORAL | 3 refills | Status: AC

## 2024-04-29 NOTE — Assessment & Plan Note (Signed)
 Off  Rybelsus : re-started at 1/2 tab a day or every other day in 2-3 weeks if ok

## 2024-04-29 NOTE — Assessment & Plan Note (Signed)
Treat edema

## 2024-04-29 NOTE — Assessment & Plan Note (Signed)
 Worse due to edema worsening edema: [name] stopped Lasix  and taking 1 Spironolactone  po 25 mg Cont Spironolactone  Take Denamadex 20 mg a day. D/c Lasix  Consider Leg Compression Machine - Sequential Pump Device For Recovery, Swelling

## 2024-04-29 NOTE — Patient Instructions (Signed)
  Consider Leg Compression Machine - Sequential Pump Device For Recovery, Swelling

## 2024-04-29 NOTE — Progress Notes (Signed)
 Subjective:  Patient ID: Larry [name], male    DOB: 1940/12/21  Age: 83 y.o. MRN: [id number]  CC: Follow-up (Patient wants to discuss a couple pills that he is taking and wants to see how they will impact his health. )   HPI Larry [name] presents for DM, HTN, B12 def C/o edema: Larry [name] stopped Lasix  and taking 1 Spironolactone  po 25 mg   Outpatient Medications Prior to Visit  Medication Sig Dispense Refill   Blood Glucose Monitoring Suppl (RELION CONFIRM GLUCOSE MONITOR) w/Device KIT Use as directed 1 kit 0   carvedilol  (COREG ) 12.5 MG tablet TAKE 1 TABLET BY MOUTH TWICE DAILY WITH A MEAL 180 tablet 0   Cholecalciferol  (VITAMIN D3) 1000 UNITS CAPS Take 1,000 Units by mouth daily.     Cyanocobalamin  (VITAMIN B-12) 1000 MCG SUBL Place 1 tablet (1,000 mcg total) under the tongue daily. 100 tablet 3   fluconazole  (DIFLUCAN ) 100 MG tablet Take 1 tablet by mouth once daily 14 tablet 0   fluorouracil (EFUDEX) 5 % cream Apply topically 2 (two) times daily.     fluticasone  (FLONASE ) 50 MCG/ACT nasal spray Place 2 sprays into both nostrils daily. 1 g 0   folic acid  (FOLVITE ) 1 MG tablet Take 1 tablet by mouth once daily 90 tablet 3   glucose blood (RELION GLUCOSE TEST STRIPS) test strip 1 each by Other route as needed for other. Use to check blood sugars twice a day 100 each 11   hydrocortisone cream 1 % Apply 1 application topically 2 (two) times daily.     linaclotide  (LINZESS ) 290 MCG CAPS capsule Take 1 capsule (290 mcg total) by mouth daily as needed. 30 capsule 5   metFORMIN  (GLUCOPHAGE ) 1000 MG tablet TAKE 1 TABLET BY MOUTH TWICE DAILY WITH A MEAL 180 tablet 0   Multiple Vitamins-Minerals (MACULAR VITAMIN BENEFIT) TABS Take 1 tablet by mouth 2 (two) times daily.     nitroGLYCERIN  (NITROSTAT ) 0.4 MG SL tablet Place 1 tablet (0.4 mg total) under the tongue every 5 (five) minutes as needed for chest pain. 20 tablet 3   ReliOn Lancet Devices 30G MISC Use to check blood sugars twice a day 180  each 1   repaglinide  (PRANDIN ) 1 MG tablet TAKE 1 TABLET BY MOUTH THREE TIMES DAILY WITH MEALS 270 tablet 3   simvastatin  (ZOCOR ) 40 MG tablet TAKE 1 TABLET BY MOUTH ONCE DAILY IN THE EVENING . APPOINTMENT REQUIRED FOR FUTURE REFILLS 90 tablet 0   spironolactone  (ALDACTONE ) 25 MG tablet TAKE 1/2 (ONE-HALF) TABLET BY MOUTH ONCE DAILY . 45 tablet 3   thiamine  100 MG tablet Take 1 tablet (100 mg total) by mouth daily. 90 tablet 3   furosemide  (LASIX ) 40 MG tablet Take 1 tablet by mouth once daily 90 tablet 3   loratadine  (CLARITIN ) 10 MG tablet TAKE 1 TABLET BY MOUTH ONCE DAILY FOR  1  MONTH  THEN  AS  NEEDED (Patient not taking: Reported on 04/29/2024) 100 tablet 0   PREVIDENT 5000 DRY MOUTH 1.1 % GEL dental gel  (Patient not taking: Reported on 04/29/2024)     Semaglutide  (RYBELSUS ) 14 MG TABS Take 1 tablet (14 mg total) by mouth daily. Fax to [employer] Patient [facility] [phone]  Pt ID # [id number] (Patient not taking: Reported on 04/29/2024) 120 tablet 5   No facility-administered medications prior to visit.    ROS: Review of Systems  Constitutional:  Positive for fatigue and unexpected weight change. Negative for appetite change.  HENT:  Negative for congestion, nosebleeds, sneezing, sore throat and trouble swallowing.   Eyes:  Negative for itching and visual disturbance.  Respiratory:  Negative for cough.   Cardiovascular:  Positive for leg swelling. Negative for chest pain and palpitations.  Gastrointestinal:  Negative for abdominal distention, blood in stool, diarrhea and nausea.  Genitourinary:  Negative for frequency and hematuria.  Musculoskeletal:  Positive for arthralgias. Negative for back pain, gait problem, joint swelling and neck pain.  Skin:  Negative for rash.  Neurological:  Negative for dizziness, tremors, speech difficulty and weakness.  Psychiatric/Behavioral:  Negative for agitation, dysphoric mood and sleep disturbance. The patient is not nervous/anxious.      Objective:  BP 136/78   Pulse 74   Temp 97.7 F (36.5 C) (Oral)   Ht 6' (1.829 m)   Wt 228 lb 6.4 oz (103.6 kg)   SpO2 96%   BMI 30.98 kg/m   BP Readings from Last 3 Encounters:  04/29/24 136/78  03/11/24 130/72  12/11/23 (!) 108/54    Wt Readings from Last 3 Encounters:  04/29/24 228 lb 6.4 oz (103.6 kg)  03/11/24 215 lb 6.4 oz (97.7 kg)  02/19/24 220 lb (99.8 kg)    Physical Exam Constitutional:      General: He is not in acute distress.    Appearance: He is well-developed.     Comments: NAD  Eyes:     Conjunctiva/sclera: Conjunctivae normal.     Pupils: Pupils are equal, round, and reactive to light.  Neck:     Thyroid : No thyromegaly.     Vascular: No JVD.  Cardiovascular:     Rate and Rhythm: Normal rate and regular rhythm.     Heart sounds: Normal heart sounds. No murmur heard.    No friction rub. No gallop.  Pulmonary:     Effort: Pulmonary effort is normal. No respiratory distress.     Breath sounds: Normal breath sounds. No wheezing or rales.  Chest:     Chest wall: No tenderness.  Abdominal:     General: Bowel sounds are normal. There is no distension.     Palpations: Abdomen is soft. There is no mass.     Tenderness: There is no abdominal tenderness. There is no guarding or rebound.  Musculoskeletal:        General: No tenderness. Normal range of motion.     Cervical back: Normal range of motion.     Right lower leg: Edema present.     Left lower leg: Edema present.  Lymphadenopathy:     Cervical: No cervical adenopathy.  Skin:    General: Skin is warm and dry.     Findings: No rash.  Neurological:     Mental Status: He is alert and oriented to person, place, and time.     Cranial Nerves: No cranial nerve deficit.     Motor: No abnormal muscle tone.     Coordination: Coordination normal.     Gait: Gait normal.     Deep Tendon Reflexes: Reflexes are normal and symmetric.  Psychiatric:        Behavior: Behavior normal.        Thought  Content: Thought content normal.        Judgment: Judgment normal.   2-3+ edema B ankles  Lab Results  Component Value Date   WBC 11.5 (H) 10/08/2023   HGB 13.4 10/08/2023   HCT 41.5 10/08/2023   PLT 218 10/08/2023   GLUCOSE 173 (H) 12/11/2023  CHOL 125 06/16/2020   TRIG 99.0 06/16/2020   HDL 41.10 06/16/2020   LDLCALC 64 06/16/2020   ALT 10 12/11/2023   AST 14 12/11/2023   NA 139 12/11/2023   K 5.2 No hemolysis seen (H) 12/11/2023   CL 102 12/11/2023   CREATININE 1.59 (H) 12/11/2023   BUN 23 12/11/2023   CO2 29 12/11/2023   TSH 2.42 12/11/2023   PSA 0.00 Repeated and verified X2. 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              Left CEA 08/22/2017 Performing Technologist: Geni Lodge RVS, RCS  Examination Guidelines: A complete evaluation includes B-mode imaging, spectral Doppler, color Doppler, and power Doppler as needed of all accessible portions of each vessel. Bilateral testing is considered an integral part of a complete examination. Limited examinations for reoccurring indications may be performed as noted.  Right Carotid Findings: +----------+--------+--------+--------+------------------+--------+           PSV cm/sEDV cm/sStenosisPlaque DescriptionComments +----------+--------+--------+--------+------------------+--------+ CCA Prox  66      15                                          +----------+--------+--------+--------+------------------+--------+ CCA Mid   127     24                                         +----------+--------+--------+--------+------------------+--------+ CCA Distal96      26                                         +----------+--------+--------+--------+------------------+--------+ ICA Prox  174     45      40-59%                             +----------+--------+--------+--------+------------------+--------+ ICA Mid   141     37                                         +----------+--------+--------+--------+------------------+--------+ ICA Distal136     41                                         +----------+--------+--------+--------+------------------+--------+ ECA       96      26                                         +----------+--------+--------+--------+------------------+--------+ +----------+--------+-------+----------------+-------------------+  PSV cm/sEDV cmsDescribe        Arm Pressure (mmHG) +----------+--------+-------+----------------+-------------------+ [id number]            Multiphasic, WNL                    +----------+--------+-------+----------------+-------------------+ +---------+--------+--+--------+--+---------+ VertebralPSV cm/s56EDV cm/s14Antegrade +---------+--------+--+--------+--+---------+  Left Carotid Findings: +----------+--------+--------+--------+------------------+--------+           PSV cm/sEDV cm/sStenosisPlaque DescriptionComments +----------+--------+--------+--------+------------------+--------+ CCA Prox  110     17                                         +----------+--------+--------+--------+------------------+--------+ CCA Mid   169     24                                         +----------+--------+--------+--------+------------------+--------+ CCA Distal147     19                                          +----------+--------+--------+--------+------------------+--------+ ICA Prox  137     21      1-39%                              +----------+--------+--------+--------+------------------+--------+ ICA Mid   133     32                                         +----------+--------+--------+--------+------------------+--------+ ICA Distal121     26                                         +----------+--------+--------+--------+------------------+--------+ ECA       134                                                +----------+--------+--------+--------+------------------+--------+ +----------+--------+--------+----------------+-------------------+           PSV cm/sEDV cm/sDescribe        Arm Pressure (mmHG) +----------+--------+--------+----------------+-------------------+ [id number]             Multiphasic, WNL                    +----------+--------+--------+----------------+-------------------+ +---------+--------+--+--------+--+---------+ VertebralPSV cm/s98EDV cm/s28Antegrade +---------+--------+--+--------+--+---------+   Summary: Right Carotid: Velocities in the right ICA are consistent with a 40-59%                stenosis. Left Carotid: Velocities in the left ICA are consistent with a 1-39% stenosis. Vertebrals:  Bilateral vertebral arteries demonstrate antegrade flow. Subclavians: Normal flow hemodynamics were seen in bilateral subclavian              arteries. *See table(s) above for measurements and observations.  Electronically signed by Gaile New MD on 11/04/2023 at 3:05:57 PM.    Final     Assessment & Plan:   Problem List Items Addressed This  Visit     DM2 (diabetes mellitus, type 2) (HCC)   Off  Rybelsus : re-started at 1/2 tab a day or every other day in 2-3 weeks if ok      Edema - Primary   Worsening edema: Larry [name] stopped Lasix  and taking 1 Spironolactone  po 25 mg Cont Spironolactone  Take Demadex  20 mg a day. D/c Lasix  Consider Leg  Compression Machine - Sequential Pump Device For Recovery, Swelling and Pain Relief      Falls   Worse due to edema worsening edema: Larry [name] stopped Lasix  and taking 1 Spironolactone  po 25 mg Cont Spironolactone  Take Denamadex 20 mg a day. D/c Lasix  Consider Leg Compression Machine - Sequential Pump Device For Recovery, Swelling      Fatigue   Treat edema         Meds ordered this encounter  Medications   DISCONTD: furosemide  (LASIX ) 40 MG tablet    Sig: Take 1 tablet by mouth once daily    Dispense:  90 tablet    Refill:  3   torsemide  (DEMADEX ) 20 MG tablet    Sig: Take 1 tablet (20 mg total) by mouth daily.    Dispense:  90 tablet    Refill:  3      Follow-up: Return in about 6 weeks (around 06/10/2024) for a follow-up visit.  Marolyn Noel, MD

## 2024-04-29 NOTE — Assessment & Plan Note (Addendum)
 Worsening edema: Larry [name] stopped Lasix  and taking 1 Spironolactone  po 25 mg Cont Spironolactone  Take Demadex  20 mg a day. D/c Lasix  Consider Leg Compression Machine - Sequential Pump Device For Recovery, Swelling and Pain Relief

## 2024-05-01 ENCOUNTER — Telehealth: Payer: Self-pay | Admitting: Radiology

## 2024-05-01 NOTE — Telephone Encounter (Signed)
 Copied from CRM #[id number]. Topic: Clinical - Medication Question >> May 01, 2024  3:46 PM Chiquita SQUIBB wrote: Reason for CRM: Patients wife is calling in stating Dr. Garald just put the patient on torsemide , and is asking if he should still be taking the other two water pills spironolactone , and fluconazole . Please advise patient if he should be taking all three of these.

## 2024-05-03 NOTE — Telephone Encounter (Signed)
 Larry [name] should continue with spironolactone .  Stop furosemide .  Thanks

## 2024-05-05 NOTE — Telephone Encounter (Signed)
 Spoke with patient and patient's wife to discuss notes per PCP to stop Furosemide  but continue on Spironolactone . Pt and wife acknowledged understanding.

## 2024-05-13 ENCOUNTER — Telehealth: Payer: Self-pay

## 2024-05-13 NOTE — Telephone Encounter (Signed)
 Received Rybelsus  patient assistance medication today 05/13/2024.   Rybelsus : 14mg  Tablets: 30  4 boxes received.   Patient was contacted and advised medication is ready to be picked up. Patient state dit will more than likely be picked up tomorrow 05/14/2024.

## 2024-05-28 DIAGNOSIS — E119 Type 2 diabetes mellitus without complications: Secondary | ICD-10-CM | POA: Diagnosis not present

## 2024-05-28 DIAGNOSIS — H353132 Nonexudative age-related macular degeneration, bilateral, intermediate dry stage: Secondary | ICD-10-CM | POA: Diagnosis not present

## 2024-05-28 DIAGNOSIS — H26491 Other secondary cataract, right eye: Secondary | ICD-10-CM | POA: Diagnosis not present

## 2024-05-28 DIAGNOSIS — H40013 Open angle with borderline findings, low risk, bilateral: Secondary | ICD-10-CM | POA: Diagnosis not present

## 2024-05-28 LAB — OPHTHALMOLOGY REPORT-SCANNED

## 2024-05-31 ENCOUNTER — Other Ambulatory Visit: Payer: Self-pay | Admitting: Internal Medicine

## 2024-06-09 DIAGNOSIS — H40013 Open angle with borderline findings, low risk, bilateral: Secondary | ICD-10-CM | POA: Diagnosis not present

## 2024-06-09 LAB — OPHTHALMOLOGY REPORT-SCANNED

## 2024-06-10 ENCOUNTER — Ambulatory Visit: Admitting: Internal Medicine

## 2024-06-10 ENCOUNTER — Encounter: Payer: Self-pay | Admitting: Internal Medicine

## 2024-06-10 VITALS — BP 128/68 | HR 78 | Temp 97.8°F | Ht 72.0 in | Wt 213.8 lb

## 2024-06-10 DIAGNOSIS — I25709 Atherosclerosis of coronary artery bypass graft(s), unspecified, with unspecified angina pectoris: Secondary | ICD-10-CM | POA: Diagnosis not present

## 2024-06-10 DIAGNOSIS — R600 Localized edema: Secondary | ICD-10-CM | POA: Diagnosis not present

## 2024-06-10 DIAGNOSIS — I10 Essential (primary) hypertension: Secondary | ICD-10-CM | POA: Diagnosis not present

## 2024-06-10 DIAGNOSIS — R0609 Other forms of dyspnea: Secondary | ICD-10-CM | POA: Diagnosis not present

## 2024-06-10 DIAGNOSIS — E538 Deficiency of other specified B group vitamins: Secondary | ICD-10-CM

## 2024-06-10 DIAGNOSIS — E1159 Type 2 diabetes mellitus with other circulatory complications: Secondary | ICD-10-CM | POA: Diagnosis not present

## 2024-06-10 DIAGNOSIS — Z23 Encounter for immunization: Secondary | ICD-10-CM | POA: Diagnosis not present

## 2024-06-10 DIAGNOSIS — E785 Hyperlipidemia, unspecified: Secondary | ICD-10-CM | POA: Diagnosis not present

## 2024-06-10 LAB — COMPREHENSIVE METABOLIC PANEL WITH GFR
ALT: 11 U/L (ref 0–53)
AST: 10 U/L (ref 0–37)
Albumin: 4.1 g/dL (ref 3.5–5.2)
Alkaline Phosphatase: 61 U/L (ref 39–117)
BUN: 51 mg/dL — ABNORMAL HIGH (ref 6–23)
CO2: 27 meq/L (ref 19–32)
Calcium: 9.3 mg/dL (ref 8.4–10.5)
Chloride: 102 meq/L (ref 96–112)
Creatinine, Ser: 1.82 mg/dL — ABNORMAL HIGH (ref 0.40–1.50)
GFR: 34.15 mL/min — ABNORMAL LOW (ref >60.00)
Glucose, Bld: 315 mg/dL — ABNORMAL HIGH (ref 70–99)
Potassium: 5.1 meq/L (ref 3.5–5.1)
Sodium: 137 meq/L (ref 135–145)
Total Bilirubin: 0.5 mg/dL (ref 0.2–1.2)
Total Protein: 6.6 g/dL (ref 6.0–8.3)

## 2024-06-10 LAB — LIPID PANEL
Cholesterol: 119 mg/dL (ref 0–200)
HDL: 36.3 mg/dL — ABNORMAL LOW (ref >39.00)
LDL Cholesterol: 63 mg/dL (ref 0–99)
NonHDL: 83.17
Total CHOL/HDL Ratio: 3
Triglycerides: 100 mg/dL (ref 0.0–149.0)
VLDL: 20 mg/dL (ref 0.0–40.0)

## 2024-06-10 LAB — TSH: TSH: 2.67 u[IU]/mL (ref 0.35–5.50)

## 2024-06-10 LAB — HEMOGLOBIN A1C: Hgb A1c MFr Bld: 7 % — ABNORMAL HIGH (ref 4.6–6.5)

## 2024-06-10 MED ORDER — SPIRONOLACTONE 25 MG PO TABS: 25.0000 mg | ORAL_TABLET | Freq: Once | ORAL | 3 refills | Status: DC

## 2024-06-10 NOTE — Assessment & Plan Note (Signed)
 Off  Rybelsus 

## 2024-06-10 NOTE — Assessment & Plan Note (Signed)
On Vit B complex

## 2024-06-10 NOTE — Assessment & Plan Note (Signed)
 Lost 15 lbs BP, edema - much better on Demadex  and Spironolactone 

## 2024-06-10 NOTE — Assessment & Plan Note (Signed)
Chronic, multifactorial 

## 2024-06-10 NOTE — Addendum Note (Signed)
 Addended byBETHA LUCETTA CLEATRICE LELON on: 06/10/2024 11:07 AM   Modules accepted: Orders

## 2024-06-10 NOTE — Assessment & Plan Note (Signed)
 Simvastatin , Coreg  No CP On baby ASA

## 2024-06-10 NOTE — Progress Notes (Signed)
 Subjective:  Patient ID: Larry [name], male    DOB: Oct 12, 1940  Age: 83 y.o. MRN: [id number]  CC: Medical Management of Chronic Issues (6 week follow up for localized edema)   HPI Larry [name] presents for edema - much better on Demadex  and Spironolactone  F/u on DM F/u on CAD  Outpatient Medications Prior to Visit  Medication Sig Dispense Refill   Blood Glucose Monitoring Suppl (RELION CONFIRM GLUCOSE MONITOR) w/Device KIT Use as directed 1 kit 0   carvedilol  (COREG ) 12.5 MG tablet TAKE 1 TABLET BY MOUTH TWICE DAILY WITH A MEAL 180 tablet 0   Cholecalciferol  (VITAMIN D3) 1000 UNITS CAPS Take 1,000 Units by mouth daily.     Cyanocobalamin  (VITAMIN B-12) 1000 MCG SUBL Place 1 tablet (1,000 mcg total) under the tongue daily. 100 tablet 3   fluconazole  (DIFLUCAN ) 100 MG tablet Take 1 tablet by mouth once daily 14 tablet 0   fluorouracil (EFUDEX) 5 % cream Apply topically 2 (two) times daily.     fluticasone  (FLONASE ) 50 MCG/ACT nasal spray Place 2 sprays into both nostrils daily. 1 g 0   folic acid  (FOLVITE ) 1 MG tablet Take 1 tablet by mouth once daily 90 tablet 3   glucose blood (RELION GLUCOSE TEST STRIPS) test strip 1 each by Other route as needed for other. Use to check blood sugars twice a day 100 each 11   hydrocortisone cream 1 % Apply 1 application topically 2 (two) times daily.     linaclotide  (LINZESS ) 290 MCG CAPS capsule Take 1 capsule (290 mcg total) by mouth daily as needed. 30 capsule 5   metFORMIN  (GLUCOPHAGE ) 1000 MG tablet TAKE 1 TABLET BY MOUTH TWICE DAILY WITH A MEAL 180 tablet 0   Multiple Vitamins-Minerals (MACULAR VITAMIN BENEFIT) TABS Take 1 tablet by mouth 2 (two) times daily.     nitroGLYCERIN  (NITROSTAT ) 0.4 MG SL tablet Place 1 tablet (0.4 mg total) under the tongue every 5 (five) minutes as needed for chest pain. 20 tablet 3   ReliOn Lancet Devices 30G MISC Use to check blood sugars twice a day 180 each 1   repaglinide  (PRANDIN ) 1 MG tablet TAKE 1  TABLET BY MOUTH THREE TIMES DAILY WITH MEALS 270 tablet 3   simvastatin  (ZOCOR ) 40 MG tablet TAKE 1 TABLET BY MOUTH ONCE DAILY IN THE EVENING . APPOINTMENT REQUIRED FOR FUTURE REFILLS 90 tablet 0   thiamine  100 MG tablet Take 1 tablet (100 mg total) by mouth daily. 90 tablet 3   torsemide  (DEMADEX ) 20 MG tablet Take 1 tablet (20 mg total) by mouth daily. 90 tablet 3   spironolactone  (ALDACTONE ) 25 MG tablet TAKE 1/2 (ONE-HALF) TABLET BY MOUTH ONCE DAILY . 45 tablet 3   loratadine  (CLARITIN ) 10 MG tablet TAKE 1 TABLET BY MOUTH ONCE DAILY FOR  1  MONTH  THEN  AS  NEEDED (Patient not taking: Reported on 06/10/2024) 100 tablet 0   PREVIDENT 5000 DRY MOUTH 1.1 % GEL dental gel  (Patient not taking: Reported on 06/10/2024)     Semaglutide  (RYBELSUS ) 14 MG TABS Take 1 tablet (14 mg total) by mouth daily. Fax to [employer] [facility] [phone]  Pt ID # [id number] (Patient not taking: Reported on 06/10/2024) 120 tablet 5   No facility-administered medications prior to visit.    ROS: Review of Systems  Constitutional:  Negative for appetite change, fatigue and unexpected weight change.  HENT:  Negative for congestion, nosebleeds, sneezing, sore throat and trouble swallowing.  Eyes:  Negative for itching and visual disturbance.  Respiratory:  Negative for cough.   Cardiovascular:  Positive for leg swelling. Negative for chest pain and palpitations.  Gastrointestinal:  Negative for abdominal distention, blood in stool, diarrhea and nausea.  Genitourinary:  Negative for frequency and hematuria.  Musculoskeletal:  Negative for back pain, gait problem, joint swelling and neck pain.  Skin:  Negative for rash.  Neurological:  Negative for dizziness, tremors, speech difficulty and weakness.  Psychiatric/Behavioral:  Negative for agitation, dysphoric mood, sleep disturbance and suicidal ideas. The patient is not nervous/anxious.     Objective:  BP 128/68   Pulse 78   Temp 97.8 F (36.6 C)    Ht 6' (1.829 m)   Wt 213 lb 12.8 oz (97 kg)   SpO2 99%   BMI 29.00 kg/m   BP Readings from Last 3 Encounters:  06/10/24 128/68  04/29/24 136/78  03/11/24 130/72    Wt Readings from Last 3 Encounters:  06/10/24 213 lb 12.8 oz (97 kg)  04/29/24 228 lb 6.4 oz (103.6 kg)  03/11/24 215 lb 6.4 oz (97.7 kg)    Physical Exam Constitutional:      General: He is not in acute distress.    Appearance: He is well-developed. He is obese.     Comments: NAD  Eyes:     Conjunctiva/sclera: Conjunctivae normal.     Pupils: Pupils are equal, round, and reactive to light.  Neck:     Thyroid : No thyromegaly.     Vascular: No JVD.  Cardiovascular:     Rate and Rhythm: Normal rate and regular rhythm.     Heart sounds: Normal heart sounds. No murmur heard.    No friction rub. No gallop.  Pulmonary:     Effort: Pulmonary effort is normal. No respiratory distress.     Breath sounds: Normal breath sounds. No wheezing or rales.  Chest:     Chest wall: No tenderness.  Abdominal:     General: Bowel sounds are normal. There is no distension.     Palpations: Abdomen is soft. There is no mass.     Tenderness: There is no abdominal tenderness. There is no guarding or rebound.  Musculoskeletal:        General: No tenderness. Normal range of motion.     Cervical back: Normal range of motion.  Lymphadenopathy:     Cervical: No cervical adenopathy.  Skin:    General: Skin is warm and dry.     Findings: No rash.  Neurological:     Mental Status: He is alert and oriented to person, place, and time.     Cranial Nerves: No cranial nerve deficit.     Motor: No abnormal muscle tone.     Coordination: Coordination normal.     Gait: Gait normal.     Deep Tendon Reflexes: Reflexes are normal and symmetric.  Psychiatric:        Behavior: Behavior normal.        Thought Content: Thought content normal.        Judgment: Judgment normal.    Trace edema - much better Lab Results  Component Value Date    WBC 11.5 (H) 10/08/2023   HGB 13.4 10/08/2023   HCT 41.5 10/08/2023   PLT 218 10/08/2023   GLUCOSE 173 (H) 12/11/2023   CHOL 125 06/16/2020   TRIG 99.0 06/16/2020   HDL 41.10 06/16/2020   LDLCALC 64 06/16/2020   ALT 10 12/11/2023   AST 14  12/11/2023   NA 139 12/11/2023   K 5.2 No hemolysis seen (H) 12/11/2023   CL 102 12/11/2023   CREATININE 1.59 (H) 12/11/2023   BUN 23 12/11/2023   CO2 29 12/11/2023   TSH 2.42 12/11/2023   PSA 0.00 Repeated and verified X2. (L) 03/12/2023   INR 1.0 10/18/2021   HGBA1C 5.9 12/11/2023    VAS US  CAROTID Result Date: 11/04/2023 Carotid Arterial Duplex Study Patient Name:  LEEUM SANKEY  Date of Exam:   11/04/2023 Medical Rec #: [id number]        Accession #:    [id number] Date of Birth: [birth year]        Patient Gender: M Patient Age:   36 years Exam Location:  [facility] Procedure:      VAS US  CAROTID Referring Phys: GAILE NEW --------------------------------------------------------------------------------  Indications: Carotid artery disease and left endarterectomy.              Left CEA 08/22/2017 Performing Technologist: Geni Lodge RVS, RCS  Examination Guidelines: A complete evaluation includes B-mode imaging, spectral Doppler, color Doppler, and power Doppler as needed of all accessible portions of each vessel. Bilateral testing is considered an integral part of a complete examination. Limited examinations for reoccurring indications may be performed as noted.  Right Carotid Findings: +----------+--------+--------+--------+------------------+--------+           PSV cm/sEDV cm/sStenosisPlaque DescriptionComments +----------+--------+--------+--------+------------------+--------+ CCA Prox  66      15                                         +----------+--------+--------+--------+------------------+--------+ CCA Mid   127     24                                          +----------+--------+--------+--------+------------------+--------+ CCA Distal96      26                                         +----------+--------+--------+--------+------------------+--------+ ICA Prox  174     45      40-59%                             +----------+--------+--------+--------+------------------+--------+ ICA Mid   141     37                                         +----------+--------+--------+--------+------------------+--------+ ICA Distal136     41                                         +----------+--------+--------+--------+------------------+--------+ ECA       96      26                                         +----------+--------+--------+--------+------------------+--------+ +----------+--------+-------+----------------+-------------------+  PSV cm/sEDV cmsDescribe        Arm Pressure (mmHG) +----------+--------+-------+----------------+-------------------+ [id number]            Multiphasic, WNL                    +----------+--------+-------+----------------+-------------------+ +---------+--------+--+--------+--+---------+ VertebralPSV cm/s56EDV cm/s14Antegrade +---------+--------+--+--------+--+---------+  Left Carotid Findings: +----------+--------+--------+--------+------------------+--------+           PSV cm/sEDV cm/sStenosisPlaque DescriptionComments +----------+--------+--------+--------+------------------+--------+ CCA Prox  110     17                                         +----------+--------+--------+--------+------------------+--------+ CCA Mid   169     24                                         +----------+--------+--------+--------+------------------+--------+ CCA Distal147     19                                         +----------+--------+--------+--------+------------------+--------+ ICA Prox  137     21      1-39%                               +----------+--------+--------+--------+------------------+--------+ ICA Mid   133     32                                         +----------+--------+--------+--------+------------------+--------+ ICA Distal121     26                                         +----------+--------+--------+--------+------------------+--------+ ECA       134                                                +----------+--------+--------+--------+------------------+--------+ +----------+--------+--------+----------------+-------------------+           PSV cm/sEDV cm/sDescribe        Arm Pressure (mmHG) +----------+--------+--------+----------------+-------------------+ [id number]             Multiphasic, WNL                    +----------+--------+--------+----------------+-------------------+ +---------+--------+--+--------+--+---------+ VertebralPSV cm/s98EDV cm/s28Antegrade +---------+--------+--+--------+--+---------+   Summary: Right Carotid: Velocities in the right ICA are consistent with a 40-59%                stenosis. Left Carotid: Velocities in the left ICA are consistent with a 1-39% stenosis. Vertebrals:  Bilateral vertebral arteries demonstrate antegrade flow. Subclavians: Normal flow hemodynamics were seen in bilateral subclavian              arteries. *See table(s) above for measurements and observations.  Electronically signed by Gaile New MD on 11/04/2023 at 3:05:57 PM.    Final     Assessment & Plan:   Problem List Items Addressed This  Visit     Coronary artery disease involving coronary bypass graft of native heart with angina pectoris   Simvastatin , Coreg  No CP On baby ASA      Relevant Medications   spironolactone  (ALDACTONE ) 25 MG tablet   Diabetes mellitus with cardiac complication (HCC)   Off  Rybelsus       Relevant Medications   spironolactone  (ALDACTONE ) 25 MG tablet   Other Relevant Orders   Hemoglobin A1c   DOE (dyspnea on exertion)     Chronic, multifactorial      Edema - Primary   Lost 15 lbs BP, edema - much better on Demadex  and Spironolactone       Relevant Orders   Comprehensive metabolic panel with GFR   TSH   Essential hypertension, benign   On BP meds BP, edema - much better on Demadex  and Spironolactone       Relevant Medications   spironolactone  (ALDACTONE ) 25 MG tablet   Low serum vitamin B12   On Vit B complex         Meds ordered this encounter  Medications   spironolactone  (ALDACTONE ) 25 MG tablet    Sig: Take 1 tablet (25 mg total) by mouth once for 1 dose. TAKE 1/2 (ONE-HALF) TABLET BY MOUTH ONCE DAILY .    Dispense:  45 tablet    Refill:  3      Follow-up: Return in about 3 months (around 09/10/2024) for a follow-up visit.  Marolyn Noel, MD

## 2024-06-10 NOTE — Assessment & Plan Note (Signed)
 On BP meds BP, edema - much better on Demadex  and Spironolactone 

## 2024-06-11 ENCOUNTER — Ambulatory Visit: Payer: Self-pay | Admitting: Internal Medicine

## 2024-06-22 ENCOUNTER — Other Ambulatory Visit: Payer: Self-pay | Admitting: Internal Medicine

## 2024-06-26 ENCOUNTER — Other Ambulatory Visit: Payer: Self-pay | Admitting: Internal Medicine

## 2024-06-30 DIAGNOSIS — H40013 Open angle with borderline findings, low risk, bilateral: Secondary | ICD-10-CM | POA: Diagnosis not present

## 2024-07-02 ENCOUNTER — Other Ambulatory Visit: Payer: Self-pay | Admitting: Internal Medicine

## 2024-07-02 MED ORDER — SPIRONOLACTONE 25 MG PO TABS: 25.0000 mg | ORAL_TABLET | Freq: Once | ORAL | 3 refills | Status: AC

## 2024-07-02 MED ORDER — KETOCONAZOLE 2 % EX CREA: TOPICAL_CREAM | Freq: Every day | CUTANEOUS | 0 refills | Status: AC | PRN

## 2024-07-02 NOTE — Telephone Encounter (Signed)
 Copied from CRM [id number]. Topic: Clinical - Medication Refill >> Jul 02, 2024  2:22 PM Winona R wrote: Medication: spironolactone  (ALDACTONE ) 25 MG tablet (Expired) 1 per day ketoconazole  (NIZORAL ) 2 % cream  Has the patient contacted their pharmacy? Yes (Agent: If no, request that the patient contact the pharmacy for the refill. If patient does not wish to contact the pharmacy document the reason why and proceed with request.) (Agent: If yes, when and what did the pharmacy advise?)  This is the patient's preferred pharmacy:  [facility] [address] [place] - [address] [place] [address] Phone: [phone] Fax: [phone]  Is this the correct pharmacy for this prescription? Yes If no, delete pharmacy and type the correct one.   Has the prescription been filled recently? Yes  Is the patient out of the medication? Yes  Has the patient been seen for an appointment in the last year OR does the patient have an upcoming appointment? Yes  Can we respond through MyChart? Yes  Agent: Please be advised that Rx refills may take up to 3 business days. We ask that you follow-up with your pharmacy.

## 2024-08-29 ENCOUNTER — Other Ambulatory Visit: Payer: Self-pay | Admitting: Internal Medicine

## 2024-09-06 ENCOUNTER — Other Ambulatory Visit: Payer: Self-pay | Admitting: Internal Medicine

## 2024-09-08 LAB — OPHTHALMOLOGY REPORT-SCANNED

## 2024-09-10 ENCOUNTER — Ambulatory Visit: Admitting: Internal Medicine

## 2024-09-10 ENCOUNTER — Encounter: Payer: Self-pay | Admitting: Internal Medicine

## 2024-09-10 VITALS — BP 106/66 | HR 75 | Ht 72.0 in | Wt 221.8 lb

## 2024-09-10 DIAGNOSIS — E538 Deficiency of other specified B group vitamins: Secondary | ICD-10-CM | POA: Diagnosis not present

## 2024-09-10 DIAGNOSIS — E1159 Type 2 diabetes mellitus with other circulatory complications: Secondary | ICD-10-CM

## 2024-09-10 DIAGNOSIS — R600 Localized edema: Secondary | ICD-10-CM

## 2024-09-10 DIAGNOSIS — I10 Essential (primary) hypertension: Secondary | ICD-10-CM

## 2024-09-10 DIAGNOSIS — E785 Hyperlipidemia, unspecified: Secondary | ICD-10-CM | POA: Diagnosis not present

## 2024-09-10 DIAGNOSIS — R0609 Other forms of dyspnea: Secondary | ICD-10-CM | POA: Diagnosis not present

## 2024-09-10 DIAGNOSIS — E119 Type 2 diabetes mellitus without complications: Secondary | ICD-10-CM

## 2024-09-10 DIAGNOSIS — I25709 Atherosclerosis of coronary artery bypass graft(s), unspecified, with unspecified angina pectoris: Secondary | ICD-10-CM | POA: Diagnosis not present

## 2024-09-10 LAB — HEMOGLOBIN A1C: Hgb A1c MFr Bld: 6.8 % — ABNORMAL HIGH (ref 4.6–6.5)

## 2024-09-10 LAB — COMPREHENSIVE METABOLIC PANEL WITH GFR
ALT: 11 U/L (ref 3–53)
AST: 14 U/L (ref 5–37)
Albumin: 4 g/dL (ref 3.5–5.2)
Alkaline Phosphatase: 60 U/L (ref 39–117)
BUN: 39 mg/dL — ABNORMAL HIGH (ref 6–23)
CO2: 28 meq/L (ref 19–32)
Calcium: 9.8 mg/dL (ref 8.4–10.5)
Chloride: 105 meq/L (ref 96–112)
Creatinine, Ser: 1.7 mg/dL — ABNORMAL HIGH (ref 0.40–1.50)
GFR: 36.99 mL/min — ABNORMAL LOW
Glucose, Bld: 199 mg/dL — ABNORMAL HIGH (ref 70–99)
Potassium: 5.3 meq/L — ABNORMAL HIGH (ref 3.5–5.1)
Sodium: 140 meq/L (ref 135–145)
Total Bilirubin: 0.5 mg/dL (ref 0.2–1.2)
Total Protein: 6.5 g/dL (ref 6.0–8.3)

## 2024-09-10 NOTE — Assessment & Plan Note (Signed)
 Off  Rybelsus  No angina

## 2024-09-10 NOTE — Assessment & Plan Note (Signed)
 On Simvastatin

## 2024-09-10 NOTE — Assessment & Plan Note (Signed)
 Low BP - d/c Hydralazine 

## 2024-09-10 NOTE — Patient Instructions (Signed)

## 2024-09-10 NOTE — Assessment & Plan Note (Signed)
On Vit B complex

## 2024-09-10 NOTE — Assessment & Plan Note (Signed)
Chronic, multifactorial 

## 2024-09-10 NOTE — Progress Notes (Signed)
 "  Subjective:  Patient ID: Larry [name], male    DOB: 1940-09-19  Age: 84 y.o. MRN: [id number]  CC: Medical Management of Chronic Issues (3 Month follow up)   HPI Larry [name] presents for edema, DM, HTN, coronary disease  Outpatient Medications Prior to Visit  Medication Sig Dispense Refill   Blood Glucose Monitoring Suppl (RELION CONFIRM GLUCOSE MONITOR) w/Device KIT Use as directed 1 kit 0   carvedilol  (COREG ) 12.5 MG tablet TAKE 1 TABLET BY MOUTH TWICE DAILY WITH A MEAL 180 tablet 3   Cholecalciferol  (VITAMIN D3) 1000 UNITS CAPS Take 1,000 Units by mouth daily.     Cyanocobalamin  (VITAMIN B-12) 1000 MCG SUBL Place 1 tablet (1,000 mcg total) under the tongue daily. 100 tablet 3   fluconazole  (DIFLUCAN ) 100 MG tablet Take 1 tablet by mouth once daily 14 tablet 0   fluorouracil (EFUDEX) 5 % cream Apply topically 2 (two) times daily.     fluticasone  (FLONASE ) 50 MCG/ACT nasal spray Place 2 sprays into both nostrils daily. 1 g 0   folic acid  (FOLVITE ) 1 MG tablet Take 1 tablet by mouth once daily 90 tablet 3   glucose blood (RELION GLUCOSE TEST STRIPS) test strip 1 each by Other route as needed for other. Use to check blood sugars twice a day 100 each 11   hydrocortisone cream 1 % Apply 1 application topically 2 (two) times daily.     ketoconazole  (NIZORAL ) 2 % cream Apply topically daily as needed for irritation. 45 g 0   linaclotide  (LINZESS ) 290 MCG CAPS capsule Take 1 capsule (290 mcg total) by mouth daily as needed. 30 capsule 5   metFORMIN  (GLUCOPHAGE ) 1000 MG tablet TAKE 1 TABLET BY MOUTH TWICE DAILY WITH A MEAL 180 tablet 3   Multiple Vitamins-Minerals (MACULAR VITAMIN BENEFIT) TABS Take 1 tablet by mouth 2 (two) times daily.     nitroGLYCERIN  (NITROSTAT ) 0.4 MG SL tablet Place 1 tablet (0.4 mg total) under the tongue every 5 (five) minutes as needed for chest pain. 20 tablet 3   ReliOn Lancet Devices 30G MISC Use to check blood sugars twice a day 180 each 1   repaglinide   (PRANDIN ) 1 MG tablet TAKE 1 TABLET BY MOUTH THREE TIMES DAILY WITH MEALS 270 tablet 3   simvastatin  (ZOCOR ) 40 MG tablet TAKE 1 TABLET BY MOUTH ONCE DAILY IN THE EVENING . APPOINTMENT REQUIRED FOR FUTURE REFILLS 90 tablet 0   spironolactone  (ALDACTONE ) 25 MG tablet Take 1 tablet (25 mg total) by mouth once for 1 dose. TAKE 1/2 (ONE-HALF) TABLET BY MOUTH ONCE DAILY . 45 tablet 3   thiamine  100 MG tablet Take 1 tablet (100 mg total) by mouth daily. 90 tablet 3   torsemide  (DEMADEX ) 20 MG tablet Take 1 tablet (20 mg total) by mouth daily. 90 tablet 3   loratadine  (CLARITIN ) 10 MG tablet TAKE 1 TABLET BY MOUTH ONCE DAILY FOR  1  MONTH  THEN  AS  NEEDED (Patient not taking: Reported on 09/10/2024) 100 tablet 0   PREVIDENT 5000 DRY MOUTH 1.1 % GEL dental gel  (Patient not taking: Reported on 09/10/2024)     Semaglutide  (RYBELSUS ) 14 MG TABS Take 1 tablet (14 mg total) by mouth daily. Fax to [employer] [phone]  Pt ID # [id number] (Patient not taking: Reported on 09/10/2024) 120 tablet 5   No facility-administered medications prior to visit.    ROS: Review of Systems  Constitutional:  Positive for fatigue. Negative for appetite  change and unexpected weight change.  HENT:  Negative for congestion, nosebleeds, sneezing, sore throat and trouble swallowing.   Eyes:  Negative for itching and visual disturbance.  Respiratory:  Negative for cough.   Cardiovascular:  Positive for leg swelling. Negative for chest pain and palpitations.  Gastrointestinal:  Negative for abdominal distention, blood in stool, diarrhea and nausea.  Genitourinary:  Positive for frequency. Negative for hematuria.  Musculoskeletal:  Negative for back pain, gait problem, joint swelling and neck pain.  Skin:  Negative for rash.  Neurological:  Negative for dizziness, tremors, speech difficulty and weakness.  Hematological:  Does not bruise/bleed easily.  Psychiatric/Behavioral:  Negative for agitation,  dysphoric mood and sleep disturbance. The patient is not nervous/anxious.     Objective:  BP 106/66   Pulse 75   Ht 6' (1.829 m)   Wt 221 lb 12.8 oz (100.6 kg)   SpO2 97%   BMI 30.08 kg/m   BP Readings from Last 3 Encounters:  09/10/24 106/66  06/10/24 128/68  04/29/24 136/78    Wt Readings from Last 3 Encounters:  09/10/24 221 lb 12.8 oz (100.6 kg)  06/10/24 213 lb 12.8 oz (97 kg)  04/29/24 228 lb 6.4 oz (103.6 kg)    Physical Exam Constitutional:      General: He is not in acute distress.    Appearance: He is well-developed. He is obese.     Comments: NAD  Eyes:     Conjunctiva/sclera: Conjunctivae normal.     Pupils: Pupils are equal, round, and reactive to light.  Neck:     Thyroid : No thyromegaly.     Vascular: No JVD.  Cardiovascular:     Rate and Rhythm: Normal rate and regular rhythm.     Heart sounds: Normal heart sounds. No murmur heard.    No friction rub. No gallop.  Pulmonary:     Effort: Pulmonary effort is normal. No respiratory distress.     Breath sounds: Normal breath sounds. No wheezing or rales.  Chest:     Chest wall: No tenderness.  Abdominal:     General: Bowel sounds are normal. There is no distension.     Palpations: Abdomen is soft. There is no mass.     Tenderness: There is no abdominal tenderness. There is no guarding or rebound.  Musculoskeletal:        General: No tenderness. Normal range of motion.     Cervical back: Normal range of motion.  Lymphadenopathy:     Cervical: No cervical adenopathy.  Skin:    General: Skin is warm and dry.     Findings: No rash.  Neurological:     Mental Status: He is alert and oriented to person, place, and time.     Cranial Nerves: No cranial nerve deficit.     Motor: No abnormal muscle tone.     Coordination: Coordination normal.     Gait: Gait normal.     Deep Tendon Reflexes: Reflexes are normal and symmetric.  Psychiatric:        Behavior: Behavior normal.        Thought Content:  Thought content normal.        Judgment: Judgment normal.    B ankle edema 1+ Lab Results  Component Value Date   WBC 11.5 (H) 10/08/2023   HGB 13.4 10/08/2023   HCT 41.5 10/08/2023   PLT 218 10/08/2023   GLUCOSE 199 (H) 09/10/2024   CHOL 119 06/10/2024   TRIG 100.0 06/10/2024  HDL 36.30 (L) 06/10/2024   LDLCALC 63 06/10/2024   ALT 11 09/10/2024   AST 14 09/10/2024   NA 140 09/10/2024   K 5.3 No hemolysis seen (H) 09/10/2024   CL 105 09/10/2024   CREATININE 1.70 (H) 09/10/2024   BUN 39 (H) 09/10/2024   CO2 28 09/10/2024   TSH 2.67 06/10/2024   PSA 0.00 Repeated and verified X2. 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              Left CEA 08/22/2017 Performing Technologist: Geni Lodge RVS, RCS  Examination Guidelines: A complete evaluation includes B-mode imaging, spectral Doppler, color Doppler, and power Doppler as needed of all accessible portions of each vessel. Bilateral testing is considered an integral part of a complete examination. Limited examinations for reoccurring indications may be performed as noted.  Right Carotid Findings: +----------+--------+--------+--------+------------------+--------+           PSV cm/sEDV cm/sStenosisPlaque DescriptionComments +----------+--------+--------+--------+------------------+--------+ CCA Prox  66      15                                          +----------+--------+--------+--------+------------------+--------+ CCA Mid   127     24                                         +----------+--------+--------+--------+------------------+--------+ CCA Distal96      26                                         +----------+--------+--------+--------+------------------+--------+ ICA Prox  174     45      40-59%                             +----------+--------+--------+--------+------------------+--------+ ICA Mid   141     37                                         +----------+--------+--------+--------+------------------+--------+ ICA Distal136     41                                         +----------+--------+--------+--------+------------------+--------+ ECA       96      26                                         +----------+--------+--------+--------+------------------+--------+ +----------+--------+-------+----------------+-------------------+  PSV cm/sEDV cmsDescribe        Arm Pressure (mmHG) +----------+--------+-------+----------------+-------------------+ [id number]            Multiphasic, WNL                    +----------+--------+-------+----------------+-------------------+ +---------+--------+--+--------+--+---------+ VertebralPSV cm/s56EDV cm/s14Antegrade +---------+--------+--+--------+--+---------+  Left Carotid Findings: +----------+--------+--------+--------+------------------+--------+           PSV cm/sEDV cm/sStenosisPlaque DescriptionComments +----------+--------+--------+--------+------------------+--------+ CCA Prox  110     17                                         +----------+--------+--------+--------+------------------+--------+ CCA Mid   169     24                                         +----------+--------+--------+--------+------------------+--------+ CCA Distal147     19                                          +----------+--------+--------+--------+------------------+--------+ ICA Prox  137     21      1-39%                              +----------+--------+--------+--------+------------------+--------+ ICA Mid   133     32                                         +----------+--------+--------+--------+------------------+--------+ ICA Distal121     26                                         +----------+--------+--------+--------+------------------+--------+ ECA       134                                                +----------+--------+--------+--------+------------------+--------+ +----------+--------+--------+----------------+-------------------+           PSV cm/sEDV cm/sDescribe        Arm Pressure (mmHG) +----------+--------+--------+----------------+-------------------+ [id number]             Multiphasic, WNL                    +----------+--------+--------+----------------+-------------------+ +---------+--------+--+--------+--+---------+ VertebralPSV cm/s98EDV cm/s28Antegrade +---------+--------+--+--------+--+---------+   Summary: Right Carotid: Velocities in the right ICA are consistent with a 40-59%                stenosis. Left Carotid: Velocities in the left ICA are consistent with a 1-39% stenosis. Vertebrals:  Bilateral vertebral arteries demonstrate antegrade flow. Subclavians: Normal flow hemodynamics were seen in bilateral subclavian              arteries. *See table(s) above for measurements and observations.  Electronically signed by Gaile New MD on 11/04/2023 at 3:05:57 PM.    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  Multifactorial-related to diabetes, venous insufficiency etc. Continue with weight loss and further, use compression socks      DOE (dyspnea on exertion)   Chronic, multifactorial      Coronary artery disease involving coronary bypass graft of native heart with angina pectoris   No angina.  On baby aspirin  Lipid management      Low serum vitamin B12   On Vit B complex         No orders of the defined types were placed in this encounter.     Follow-up: Return in about 3 months (around 12/09/2024) for a follow-up visit.  Marolyn Noel, MD "

## 2024-09-11 NOTE — Assessment & Plan Note (Signed)
 No angina.  On baby aspirin  Lipid management

## 2024-09-11 NOTE — Assessment & Plan Note (Signed)
 Chronic.  Multifactorial-related to diabetes, venous insufficiency etc. Continue with weight loss and further, use compression socks

## 2024-09-11 NOTE — Addendum Note (Signed)
 Addended by: Aireona Torelli V on: 09/11/2024 12:07 PM   Modules accepted: Level of Service

## 2024-09-14 ENCOUNTER — Other Ambulatory Visit: Payer: Self-pay | Admitting: Internal Medicine

## 2024-09-21 ENCOUNTER — Ambulatory Visit: Payer: Self-pay | Admitting: Internal Medicine

## 2024-12-09 ENCOUNTER — Ambulatory Visit: Admitting: Internal Medicine

## 2025-02-23 ENCOUNTER — Ambulatory Visit
